# Patient Record
Sex: Female | Born: 1955 | Race: Black or African American | Hispanic: No | Marital: Married | Smoking: Former smoker
Health system: Southern US, Community
[De-identification: ages and names within clinical notes are randomized; demographics above are authoritative.]

## PROBLEM LIST (undated history)

## (undated) DIAGNOSIS — I639 Cerebral infarction, unspecified: Secondary | ICD-10-CM

## (undated) DIAGNOSIS — Z923 Personal history of irradiation: Secondary | ICD-10-CM

## (undated) DIAGNOSIS — J449 Chronic obstructive pulmonary disease, unspecified: Secondary | ICD-10-CM

## (undated) DIAGNOSIS — E785 Hyperlipidemia, unspecified: Secondary | ICD-10-CM

## (undated) DIAGNOSIS — C50919 Malignant neoplasm of unspecified site of unspecified female breast: Secondary | ICD-10-CM

## (undated) DIAGNOSIS — G35 Multiple sclerosis: Secondary | ICD-10-CM

## (undated) DIAGNOSIS — K635 Polyp of colon: Secondary | ICD-10-CM

## (undated) DIAGNOSIS — N189 Chronic kidney disease, unspecified: Secondary | ICD-10-CM

## (undated) DIAGNOSIS — C50412 Malignant neoplasm of upper-outer quadrant of left female breast: Secondary | ICD-10-CM

## (undated) DIAGNOSIS — D649 Anemia, unspecified: Secondary | ICD-10-CM

## (undated) DIAGNOSIS — G35D Multiple sclerosis, unspecified: Secondary | ICD-10-CM

## (undated) DIAGNOSIS — K219 Gastro-esophageal reflux disease without esophagitis: Secondary | ICD-10-CM

## (undated) DIAGNOSIS — H539 Unspecified visual disturbance: Secondary | ICD-10-CM

## (undated) DIAGNOSIS — I10 Essential (primary) hypertension: Secondary | ICD-10-CM

## (undated) DIAGNOSIS — M1990 Unspecified osteoarthritis, unspecified site: Secondary | ICD-10-CM

## (undated) DIAGNOSIS — Z9221 Personal history of antineoplastic chemotherapy: Secondary | ICD-10-CM

## (undated) HISTORY — DX: Chronic obstructive pulmonary disease, unspecified: J44.9

## (undated) HISTORY — DX: Multiple sclerosis: G35

## (undated) HISTORY — PX: POLYPECTOMY: SHX149

## (undated) HISTORY — DX: Unspecified visual disturbance: H53.9

## (undated) HISTORY — DX: Polyp of colon: K63.5

## (undated) HISTORY — DX: Malignant neoplasm of unspecified site of unspecified female breast: C50.919

## (undated) HISTORY — PX: COLONOSCOPY: SHX174

## (undated) HISTORY — DX: Anemia, unspecified: D64.9

## (undated) HISTORY — PX: TUBAL LIGATION: SHX77

## (undated) HISTORY — DX: Malignant neoplasm of upper-outer quadrant of left female breast: C50.412

## (undated) HISTORY — DX: Hyperlipidemia, unspecified: E78.5

## (undated) HISTORY — DX: Chronic kidney disease, unspecified: N18.9

## (undated) HISTORY — DX: Multiple sclerosis, unspecified: G35.D

## (undated) HISTORY — DX: Gastro-esophageal reflux disease without esophagitis: K21.9

---

## 2014-03-20 LAB — PROTIME-INR: INR: 1 (ref 0.9–1.1)

## 2014-08-19 ENCOUNTER — Emergency Department (HOSPITAL_COMMUNITY): Payer: Medicaid - Out of State

## 2014-08-19 ENCOUNTER — Encounter (HOSPITAL_COMMUNITY): Payer: Self-pay | Admitting: Emergency Medicine

## 2014-08-19 ENCOUNTER — Emergency Department (HOSPITAL_COMMUNITY)
Admission: EM | Admit: 2014-08-19 | Discharge: 2014-08-19 | Disposition: A | Discharge status: Home / Self Care | Payer: Medicaid - Out of State | Attending: Emergency Medicine | Admitting: Emergency Medicine

## 2014-08-19 DIAGNOSIS — Z79899 Other long term (current) drug therapy: Secondary | ICD-10-CM | POA: Insufficient documentation

## 2014-08-19 DIAGNOSIS — Z8673 Personal history of transient ischemic attack (TIA), and cerebral infarction without residual deficits: Secondary | ICD-10-CM | POA: Diagnosis not present

## 2014-08-19 DIAGNOSIS — R079 Chest pain, unspecified: Secondary | ICD-10-CM | POA: Diagnosis present

## 2014-08-19 DIAGNOSIS — I10 Essential (primary) hypertension: Secondary | ICD-10-CM | POA: Diagnosis not present

## 2014-08-19 HISTORY — DX: Essential (primary) hypertension: I10

## 2014-08-19 HISTORY — DX: Cerebral infarction, unspecified: I63.9

## 2014-08-19 LAB — CBC
HCT: 37.9 % (ref 36.0–46.0)
HEMOGLOBIN: 13 g/dL (ref 12.0–15.0)
MCH: 31 pg (ref 26.0–34.0)
MCHC: 34.3 g/dL (ref 30.0–36.0)
MCV: 90.5 fL (ref 78.0–100.0)
Platelets: 258 10*3/uL (ref 150–400)
RBC: 4.19 MIL/uL (ref 3.87–5.11)
RDW: 13.2 % (ref 11.5–15.5)
WBC: 5.8 10*3/uL (ref 4.0–10.5)

## 2014-08-19 LAB — COMPREHENSIVE METABOLIC PANEL
ALT: 25 U/L (ref 0–35)
ANION GAP: 13 (ref 5–15)
AST: 31 U/L (ref 0–37)
Albumin: 4.1 g/dL (ref 3.5–5.2)
Alkaline Phosphatase: 78 U/L (ref 39–117)
BUN: 14 mg/dL (ref 6–23)
CALCIUM: 9.8 mg/dL (ref 8.4–10.5)
CO2: 24 mEq/L (ref 19–32)
CREATININE: 1.12 mg/dL — AB (ref 0.50–1.10)
Chloride: 102 mEq/L (ref 96–112)
GFR, EST AFRICAN AMERICAN: 61 mL/min — AB (ref >90)
GFR, EST NON AFRICAN AMERICAN: 53 mL/min — AB (ref >90)
GLUCOSE: 99 mg/dL (ref 70–99)
Potassium: 4.1 mEq/L (ref 3.7–5.3)
Sodium: 139 mEq/L (ref 137–147)
Total Bilirubin: 0.4 mg/dL (ref 0.3–1.2)
Total Protein: 7.3 g/dL (ref 6.0–8.3)

## 2014-08-19 LAB — TROPONIN I

## 2014-08-19 MED ORDER — GI COCKTAIL ~~LOC~~
30.0000 mL | Freq: Once | ORAL | Status: AC
  Administered 2014-08-19: 30 mL via ORAL
  Filled 2014-08-19: qty 30

## 2014-08-19 MED ORDER — HYDROCODONE-ACETAMINOPHEN 5-325 MG PO TABS
1.0000 | ORAL_TABLET | Freq: Once | ORAL | Status: AC
  Administered 2014-08-19: 1 via ORAL
  Filled 2014-08-19: qty 1

## 2014-08-19 MED ORDER — FAMOTIDINE 20 MG PO TABS
20.0000 mg | ORAL_TABLET | Freq: Once | ORAL | Status: AC
  Administered 2014-08-19: 20 mg via ORAL
  Filled 2014-08-19: qty 1

## 2014-08-19 NOTE — ED Notes (Signed)
Pt A&Ox4. Moving all extremities equally. C/o left chest pain that does not radiate. C/o dizziness x3 months that "is only when I'm lying down but goes away when I sit up." Denies tobacco use at this time. Did not have a meal this morning and denies eating anything "out of the ordinary last night." Denies this pain feeling like heartburn. No other questions/concerns. Awaiting MD.

## 2014-08-19 NOTE — Discharge Instructions (Signed)
It was our pleasure to provide your ER care today - we hope that you feel better.  Continue prilosec.  You may also try pepcid and maalox as need for symptom relief if reflux symptoms.  For chest discomfort, follow up with cardiologist in coming week - call office Monday to arrange follow up appointment.  Your blood pressure is mildly high today, have it rechecked then as well.   Return to ER right away if worse, new symptoms, fevers, persistent/recurrent chest pain, trouble breathing, other concern.     Chest Pain (Nonspecific) It is often hard to give a specific diagnosis for the cause of chest pain. There is always a chance that your pain could be related to something serious, such as a heart attack or a blood clot in the lungs. You need to follow up with your health care provider for further evaluation. CAUSES   Heartburn.  Pneumonia or bronchitis.  Anxiety or stress.  Inflammation around your heart (pericarditis) or lung (pleuritis or pleurisy).  A blood clot in the lung.  A collapsed lung (pneumothorax). It can develop suddenly on its own (spontaneous pneumothorax) or from trauma to the chest.  Shingles infection (herpes zoster virus). The chest wall is composed of bones, muscles, and cartilage. Any of these can be the source of the pain.  The bones can be bruised by injury.  The muscles or cartilage can be strained by coughing or overwork.  The cartilage can be affected by inflammation and become sore (costochondritis). DIAGNOSIS  Lab tests or other studies may be needed to find the cause of your pain. Your health care provider may have you take a test called an ambulatory electrocardiogram (ECG). An ECG records your heartbeat patterns over a 24-hour period. You may also have other tests, such as:  Transthoracic echocardiogram (TTE). During echocardiography, sound waves are used to evaluate how blood flows through your heart.  Transesophageal echocardiogram  (TEE).  Cardiac monitoring. This allows your health care provider to monitor your heart rate and rhythm in real time.  Holter monitor. This is a portable device that records your heartbeat and can help diagnose heart arrhythmias. It allows your health care provider to track your heart activity for several days, if needed.  Stress tests by exercise or by giving medicine that makes the heart beat faster. TREATMENT   Treatment depends on what may be causing your chest pain. Treatment may include:  Acid blockers for heartburn.  Anti-inflammatory medicine.  Pain medicine for inflammatory conditions.  Antibiotics if an infection is present.  You may be advised to change lifestyle habits. This includes stopping smoking and avoiding alcohol, caffeine, and chocolate.  You may be advised to keep your head raised (elevated) when sleeping. This reduces the chance of acid going backward from your stomach into your esophagus. Most of the time, nonspecific chest pain will improve within 2-3 days with rest and mild pain medicine.  HOME CARE INSTRUCTIONS   If antibiotics were prescribed, take them as directed. Finish them even if you start to feel better.  For the next few days, avoid physical activities that bring on chest pain. Continue physical activities as directed.  Do not use any tobacco products, including cigarettes, chewing tobacco, or electronic cigarettes.  Avoid drinking alcohol.  Only take medicine as directed by your health care provider.  Follow your health care provider's suggestions for further testing if your chest pain does not go away.  Keep any follow-up appointments you made. If you do not  go to an appointment, you could develop lasting (chronic) problems with pain. If there is any problem keeping an appointment, call to reschedule. SEEK MEDICAL CARE IF:   Your chest pain does not go away, even after treatment.  You have a rash with blisters on your chest.  You have  a fever. SEEK IMMEDIATE MEDICAL CARE IF:   You have increased chest pain or pain that spreads to your arm, neck, jaw, back, or abdomen.  You have shortness of breath.  You have an increasing cough, or you cough up blood.  You have severe back or abdominal pain.  You feel nauseous or vomit.  You have severe weakness.  You faint.  You have chills. This is an emergency. Do not wait to see if the pain will go away. Get medical help at once. Call your local emergency services (911 in [place] Do not drive yourself to the hospital. MAKE SURE YOU:   Understand these instructions.  Will watch your condition.  Will get help right away if you are not doing well or get worse. Document Released: 08/12/2005 Document Revised: 11/07/2013 Document Reviewed: 06/07/2008 [facility] Patient Information 2015 [place], [place]. This information is not intended to replace advice given to you by your health care provider. Make sure you discuss any questions you have with your health care provider.

## 2014-08-19 NOTE — ED Provider Notes (Signed)
CSN: [id number]     Arrival date & time 08/19/14  1234 History   First MD Initiated Contact with Patient 08/19/14 1251     Chief Complaint  Patient presents with  . Chest Pain     (Consider location/radiation/quality/duration/timing/severity/associated sxs/prior Treatment) Patient is a 57 y.o. female presenting with chest pain. The history is provided by the patient.  Chest Pain Associated symptoms: no abdominal pain, no back pain, no cough, no fever, no headache, no nausea, no palpitations, no shortness of breath and not vomiting   pt c/o mid/midline chest pain onset yesterday, at rest. Pain constant since. No relation to position, not worse whether upright or supine. No relation to activity or exertion. Pain is dull, moderate, constant, non radiating, not pleuritic. Denies any associated sob, nv, diaphoresis or unusual fatigue. No cough or uri c/o. No fever or chills. Denies heartburn or reflux. Says father w  'heart problems' in older age, no fam hx premature cad. Non smoker. No drug use.  No recent febrile viral illness. No leg pain or swelling. No recent surgery, immobility, trauma or travel. No chest wall injury or strain.      Past Medical History  Diagnosis Date  . Stroke     2013  . Hypertension    No past surgical history on file. No family history on file. History  Substance Use Topics  . Smoking status: Not on file  . Smokeless tobacco: Not on file  . Alcohol Use: Not on file   OB History   Grav Para Term Preterm Abortions TAB SAB Ect Mult Living                 Review of Systems  Constitutional: Negative for fever and chills.  HENT: Negative for sore throat.   Eyes: Negative for redness.  Respiratory: Negative for cough and shortness of breath.   Cardiovascular: Positive for chest pain. Negative for palpitations and leg swelling.  Gastrointestinal: Negative for nausea, vomiting and abdominal pain.  Endocrine: Negative for polyuria.  Genitourinary: Negative  for flank pain.  Musculoskeletal: Negative for back pain and neck pain.  Skin: Negative for rash.  Neurological: Negative for headaches.  Hematological: Does not bruise/bleed easily.  Psychiatric/Behavioral: Negative for confusion.      Allergies  Review of patient's allergies indicates no known allergies.  Home Medications   Prior to Admission medications   Medication Sig Start Date End Date Taking? Authorizing Provider  labetalol (NORMODYNE) 300 MG tablet Take 600 mg by mouth 5 (five) times daily.   Yes Historical Provider, MD  niCARdipine (CARDENE) 20 MG capsule Take 40 mg by mouth every 8 (eight) hours.   Yes Historical Provider, MD  omeprazole (PRILOSEC) 20 MG capsule Take 20 mg by mouth 2 (two) times daily before a meal.   Yes Historical Provider, MD   BP 151/91  Pulse 78  Temp(Src) 97.9 F (36.6 C) (Oral)  Resp 16  SpO2 98% Physical Exam  Nursing note and vitals reviewed. Constitutional: She is oriented to person, place, and time. She appears well-developed and well-nourished. No distress.  HENT:  Mouth/Throat: Oropharynx is clear and moist.  Eyes: Conjunctivae are normal. No scleral icterus.  Neck: Neck supple. No JVD present. No tracheal deviation present.  Cardiovascular: Normal rate, regular rhythm, normal heart sounds and intact distal pulses.  Exam reveals no gallop and no friction rub.   No murmur heard. Pulmonary/Chest: Effort normal and breath sounds normal. No respiratory distress. She exhibits no tenderness.  Abdominal:  Soft. Normal appearance and bowel sounds are normal. She exhibits no distension and no mass. There is no tenderness. There is no rebound and no guarding.  Genitourinary:  No cva tenderness  Musculoskeletal: She exhibits no edema and no tenderness.  Neurological: She is alert and oriented to person, place, and time.  Skin: Skin is warm and dry. No rash noted. She is not diaphoretic.  Psychiatric: She has a normal mood and affect.    ED  Course  Procedures (including critical care time) Labs Review   Results for orders placed during the hospital encounter of 08/19/14  CBC      Result Value Ref Range   WBC 5.8  4.0 - 10.5 K/uL   RBC 4.19  3.87 - 5.11 MIL/uL   Hemoglobin 13.0  12.0 - 15.0 g/dL   HCT 37.9  36.0 - 46.0 %   MCV 90.5  78.0 - 100.0 fL   MCH 31.0  26.0 - 34.0 pg   MCHC 34.3  30.0 - 36.0 g/dL   RDW 13.2  11.5 - 15.5 %   Platelets 258  150 - 400 K/uL  COMPREHENSIVE METABOLIC PANEL      Result Value Ref Range   Sodium 139  137 - 147 mEq/L   Potassium 4.1  3.7 - 5.3 mEq/L   Chloride 102  96 - 112 mEq/L   CO2 24  19 - 32 mEq/L   Glucose, Bld 99  70 - 99 mg/dL   BUN 14  6 - 23 mg/dL   Creatinine, Ser 1.12 (*) 0.50 - 1.10 mg/dL   Calcium 9.8  8.4 - 10.5 mg/dL   Total Protein 7.3  6.0 - 8.3 g/dL   Albumin 4.1  3.5 - 5.2 g/dL   AST 31  0 - 37 U/L   ALT 25  0 - 35 U/L   Alkaline Phosphatase 78  39 - 117 U/L   Total Bilirubin 0.4  0.3 - 1.2 mg/dL   GFR calc non Af Amer 53 (*) >90 mL/min   GFR calc Af Amer 61 (*) >90 mL/min   Anion gap 13  5 - 15  TROPONIN I      Result Value Ref Range   Troponin I <0.30  <0.30 ng/mL   Dg Chest 2 View  08/19/2014   CLINICAL DATA:  Mid chest discomfort with history of hypertension and previous CVA ; initial visit  EXAM: CHEST  2 VIEW  COMPARISON:  None.  FINDINGS: The lungs are adequately inflated and clear. The cardiac silhouette is top-normal in size. The pulmonary vascularity is not engorged. The mediastinum is normal in width. There is mild tortuosity of the descending thoracic aorta. There is no pleural effusion, pneumothorax, or pneumomediastinum. The bony thorax is unremarkable.  IMPRESSION: There is no acute cardiopulmonary abnormality.   Electronically Signed   By: David  Martinique   On: 08/19/2014 13:43      EKG Interpretation   Date/Time:  Sunday August 19 2014 12:41:29 EDT Ventricular Rate:  78 PR Interval:  166 QRS Duration: 83 QT Interval:  395 QTC  Calculation: 450 R Axis:   37 Text Interpretation:  Sinus rhythm Nonspecific T abnormalities, lateral  leads Baseline wander in lead(s) V3 No previous tracing Confirmed by  Ashok Cordia  MD, Lennette Bihari ([id number]) on 08/19/2014 12:52:43 PM      MDM  Iv ns. Continuous pulse ox and monitor. Labs.  Reviewed nursing notes and prior charts for additional history.   Pt reports normal stress  test approx 3 yrs ago. No exertional cp or discomfort. No sob.  Will try pepcid, gi cocktail, vicodin 1 po - for symptom relief.  Pt w relief of symptoms w above meds.  After symptoms present, constant, x 24 hrs, trop normal/negative.  Pt currently appears stable for d/c.  Given recent cp, will have f/u closely w pcp/card as outpt.       Mirna Mires, MD 08/19/14 [phone]

## 2014-08-19 NOTE — ED Notes (Signed)
Pt reports hx of stroke in 2013 and HTN. Reports intermittent left sided chest pain starting yesterday. Aching pain 7/10. Denies SOB, denies n/v/d.

## 2014-11-05 ENCOUNTER — Encounter (HOSPITAL_COMMUNITY): Payer: Self-pay | Admitting: Emergency Medicine

## 2014-11-05 ENCOUNTER — Emergency Department (HOSPITAL_COMMUNITY)
Admission: EM | Admit: 2014-11-05 | Discharge: 2014-11-05 | Disposition: A | Discharge status: Home / Self Care | Payer: Medicare Other | Attending: Emergency Medicine | Admitting: Emergency Medicine

## 2014-11-05 DIAGNOSIS — J069 Acute upper respiratory infection, unspecified: Secondary | ICD-10-CM | POA: Diagnosis not present

## 2014-11-05 DIAGNOSIS — Z79899 Other long term (current) drug therapy: Secondary | ICD-10-CM | POA: Diagnosis not present

## 2014-11-05 DIAGNOSIS — Z8673 Personal history of transient ischemic attack (TIA), and cerebral infarction without residual deficits: Secondary | ICD-10-CM | POA: Insufficient documentation

## 2014-11-05 DIAGNOSIS — M546 Pain in thoracic spine: Secondary | ICD-10-CM | POA: Diagnosis not present

## 2014-11-05 DIAGNOSIS — I10 Essential (primary) hypertension: Secondary | ICD-10-CM | POA: Insufficient documentation

## 2014-11-05 DIAGNOSIS — R0981 Nasal congestion: Secondary | ICD-10-CM | POA: Diagnosis present

## 2014-11-05 LAB — URINALYSIS, ROUTINE W REFLEX MICROSCOPIC
Glucose, UA: NEGATIVE mg/dL
HGB URINE DIPSTICK: NEGATIVE
Ketones, ur: NEGATIVE mg/dL
Nitrite: NEGATIVE
Protein, ur: 100 mg/dL — AB
Specific Gravity, Urine: 1.026 (ref 1.005–1.030)
UROBILINOGEN UA: 0.2 mg/dL (ref 0.0–1.0)
pH: 5.5 (ref 5.0–8.0)

## 2014-11-05 LAB — URINE MICROSCOPIC-ADD ON

## 2014-11-05 LAB — I-STAT CHEM 8, ED
BUN: 16 mg/dL (ref 6–23)
CREATININE: 1.5 mg/dL — AB (ref 0.50–1.10)
Calcium, Ion: 1.1 mmol/L — ABNORMAL LOW (ref 1.12–1.23)
Chloride: 104 mEq/L (ref 96–112)
Glucose, Bld: 117 mg/dL — ABNORMAL HIGH (ref 70–99)
HEMATOCRIT: 41 % (ref 36.0–46.0)
HEMOGLOBIN: 13.9 g/dL (ref 12.0–15.0)
POTASSIUM: 3.7 meq/L (ref 3.7–5.3)
SODIUM: 141 meq/L (ref 137–147)
TCO2: 22 mmol/L (ref 0–100)

## 2014-11-05 NOTE — Discharge Instructions (Signed)
Return to the ER with any worsening of symptoms or trouble breathing or high fever. Return to the ER if your back pain worsens or becomes more frequent. Return if you develop any nausea, vomiting, severe pain, trouble urinating. Refer to resource guide below to help you find a primary care physician in regard to your visit today.  Upper Respiratory Infection, Adult An upper respiratory infection (URI) is also sometimes known as the common cold. The upper respiratory tract includes the nose, sinuses, throat, trachea, and bronchi. Bronchi are the airways leading to the lungs. Most people improve within 1 week, but symptoms can last up to 2 weeks. A residual cough may last even longer.  CAUSES Many different viruses can infect the tissues lining the upper respiratory tract. The tissues become irritated and inflamed and often become very moist. Mucus production is also common. A cold is contagious. You can easily spread the virus to others by oral contact. This includes kissing, sharing a glass, coughing, or sneezing. Touching your mouth or nose and then touching a surface, which is then touched by another person, can also spread the virus. SYMPTOMS  Symptoms typically develop 1 to 3 days after you come in contact with a cold virus. Symptoms vary from person to person. They may include:  Runny nose.  Sneezing.  Nasal congestion.  Sinus irritation.  Sore throat.  Loss of voice (laryngitis).  Cough.  Fatigue.  Muscle aches.  Loss of appetite.  Headache.  Low-grade fever. DIAGNOSIS  You might diagnose your own cold based on familiar symptoms, since most people get a cold 2 to 3 times a year. Your caregiver can confirm this based on your exam. Most importantly, your caregiver can check that your symptoms are not due to another disease such as strep throat, sinusitis, pneumonia, asthma, or epiglottitis. Blood tests, throat tests, and X-rays are not necessary to diagnose a common cold, but  they may sometimes be helpful in excluding other more serious diseases. Your caregiver will decide if any further tests are required. RISKS AND COMPLICATIONS  You may be at risk for a more severe case of the common cold if you smoke cigarettes, have chronic heart disease (such as heart failure) or lung disease (such as asthma), or if you have a weakened immune system. The very young and very old are also at risk for more serious infections. Bacterial sinusitis, middle ear infections, and bacterial pneumonia can complicate the common cold. The common cold can worsen asthma and chronic obstructive pulmonary disease (COPD). Sometimes, these complications can require emergency medical care and may be life-threatening. PREVENTION  The best way to protect against getting a cold is to practice good hygiene. Avoid oral or hand contact with people with cold symptoms. Wash your hands often if contact occurs. There is no clear evidence that vitamin C, vitamin E, echinacea, or exercise reduces the chance of developing a cold. However, it is always recommended to get plenty of rest and practice good nutrition. TREATMENT  Treatment is directed at relieving symptoms. There is no cure. Antibiotics are not effective, because the infection is caused by a virus, not by bacteria. Treatment may include:  Increased fluid intake. Sports drinks offer valuable electrolytes, sugars, and fluids.  Breathing heated mist or steam (vaporizer or shower).  Eating chicken soup or other clear broths, and maintaining good nutrition.  Getting plenty of rest.  Using gargles or lozenges for comfort.  Controlling fevers with ibuprofen or acetaminophen as directed by your caregiver.  Increasing  usage of your inhaler if you have asthma. Zinc gel and zinc lozenges, taken in the first 24 hours of the common cold, can shorten the duration and lessen the severity of symptoms. Pain medicines may help with fever, muscle aches, and throat  pain. A variety of non-prescription medicines are available to treat congestion and runny nose. Your caregiver can make recommendations and may suggest nasal or lung inhalers for other symptoms.  HOME CARE INSTRUCTIONS   Only take over-the-counter or prescription medicines for pain, discomfort, or fever as directed by your caregiver.  Use a warm mist humidifier or inhale steam from a shower to increase air moisture. This may keep secretions moist and make it easier to breathe.  Drink enough water and fluids to keep your urine clear or pale yellow.  Rest as needed.  Return to work when your temperature has returned to normal or as your caregiver advises. You may need to stay home longer to avoid infecting others. You can also use a face mask and careful hand washing to prevent spread of the virus. SEEK MEDICAL CARE IF:   After the first few days, you feel you are getting worse rather than better.  You need your caregiver's advice about medicines to control symptoms.  You develop chills, worsening shortness of breath, or brown or red sputum. These may be signs of pneumonia.  You develop yellow or brown nasal discharge or pain in the face, especially when you bend forward. These may be signs of sinusitis.  You develop a fever, swollen neck glands, pain with swallowing, or white areas in the back of your throat. These may be signs of strep throat. SEEK IMMEDIATE MEDICAL CARE IF:   You have a fever.  You develop severe or persistent headache, ear pain, sinus pain, or chest pain.  You develop wheezing, a prolonged cough, cough up blood, or have a change in your usual mucus (if you have chronic lung disease).  You develop sore muscles or a stiff neck. Document Released: 04/28/2001 Document Revised: 01/25/2012 Document Reviewed: 02/07/2014 [facility] Patient Information 2015 [place], [place]. This information is not intended to replace advice given to you by your health care provider. Make sure  you discuss any questions you have with your health care provider.   Emergency Department Resource Guide 1) Find a Doctor and Pay Out of Pocket Although you won't have to find out who is covered by your insurance plan, it is a good idea to ask around and get recommendations. You will then need to call the office and see if the doctor you have chosen will accept you as a new patient and what types of options they offer for patients who are self-pay. Some doctors offer discounts or will set up payment plans for their patients who do not have insurance, but you will need to ask so you aren't surprised when you get to your appointment.  2) Contact Your Local Health Department Not all health departments have doctors that can see patients for sick visits, but many do, so it is worth a call to see if yours does. If you don't know where your local health department is, you can check in your phone book. The CDC also has a tool to help you locate your state's health department, and many state websites also have listings of all of their local health departments.  3) Find a [facility] If your illness is not likely to be very severe or complicated, you may want to try a  walk in clinic. These are popping up all over the country in pharmacies, drugstores, and shopping centers. They're usually staffed by nurse practitioners or physician assistants that have been trained to treat common illnesses and complaints. They're usually fairly quick and inexpensive. However, if you have serious medical issues or chronic medical problems, these are probably not your best option.  No Primary Care Doctor: - Call Health Connect at  [phone] - they can help you locate a primary care doctor that  accepts your insurance, provides certain services, etc. - Physician Referral Service- [phone]  Chronic Pain Problems: Organization         Address  Phone   Notes  [facility]  [phone] Patients need  to be referred by their primary care doctor.   Medication Assistance: Organization         Address  Phone   Notes  [facility] Medication [facility] [address] [place], [place] [address] [phone] --Must be a resident of [facility] -- Must have NO insurance coverage whatsoever (no Medicaid/ Medicare, etc.) -- The pt. MUST have a primary care doctor that directs their care regularly and follows them in the community   MedAssist  [phone]   [employer]  [phone]    Agencies that provide inexpensive medical care: Organization         Address  Phone   Notes  [place]  [phone]   [facility]    [phone]   [facility] [place], [place] [address] [phone]   [place] [address], [place] [phone]   Planned Parenthood    [phone]   [facility]    [phone]   [place] and [place] [address], [place] Phone:  [phone], Fax:  [phone] Hours of Operation:  9 am - 6 pm, M-F.  Also accepts Medicaid/Medicare and self-pay.  [facility] for [place], [address], [place] Phone: [phone], Fax: [phone]. Hours of Operation:  8:30 am - 5:30 pm, M-F.  Also accepts Medicaid and self-pay.  [facility] [address], [place] Phone: [phone]   [place], [place], [place] [phone], Ext. [phone] Mondays & Thursdays: 7-9 AM.  First 15 patients are seen on a first come, first serve basis.    [place] Providers:  Organization         Address  Phone   Notes  [facility] [address], [place] [phone] Also accepts self-pay patients.  [facility] [address], [place]  [phone]   [place], [address], [place] [phone]   [facility] [address] [place] [phone]   Lucianne Lei [address], [place]   [phone] Only accepts [place] Access [place] patients after they have their name applied to their card.   Self-Pay (no insurance) in [facility]:  Organization         Address  Phone   Notes  Sickle Cell Patients, [facility] [place] [phone]   [facility] [place] [phone]   Gershon Mussel  [facility] [place]  [address], [place] [phone]   [facility]/Dr. Osei-Bonsu  [address] [place] or [address], [place] [phone] Phone number for both [place] and [place] locations is the same.  [facility] and [facility] [address], [place] [phone]   [facility] [address], [place] or [address] [phone]   [facility] [address], [place] [phone], phone; [phone], fax Sees patients 1st and 3rd Saturday of every month.  Must not qualify for public or private insurance (i.e. Medicaid, Medicare, [place] Health Choice, Veterans' Benefits)  Household income should be no more than 200% of the poverty level The clinic cannot treat you if you are pregnant or think you are pregnant  Sexually transmitted diseases are not treated at the clinic.    Dental Care: Organization         Address  Phone  Notes  [facility] Department of [facility] [place] [phone] Accepts children up to age 4 who are enrolled in [place] or [place]; pregnant women with a Medicaid card; and children who have applied for Medicaid or [place] Health Choice, but were declined, whose  parents can pay a reduced fee at time of service.  [facility] Department of [facility]  [address], [place] [phone] Accepts children up to age 35 who are enrolled in [place] or [place]; pregnant women with a Medicaid card; and children who have applied for Medicaid or  Health Choice, but were declined, whose parents can pay a reduced fee at time of service.  [place] Adult Dental Access PROGRAM  [place] [phone] Patients are seen by appointment only. Walk-ins are not accepted. [place] will see patients 60 years of age and older. Monday - Tuesday (8am-5pm) Most Wednesdays (8:30-5pm) $30 per visit, cash only  [facility] Adult Dental Access PROGRAM  [address], [facility] [phone] Patients are seen by appointment only. Walk-ins are not accepted. [place] will see patients 63 years of age and older. One Wednesday Evening (Monthly: Volunteer Based).  $30 per visit, cash only  [place]  [phone] for adults; Children under age 52, call Graduate Pediatric Dentistry at [phone]. Children aged 55-14, please call [phone] to request a pediatric application.  Dental services are provided in all areas of dental care including fillings, crowns and bridges, complete and partial dentures, implants, gum treatment, root canals, and extractions. Preventive care is also provided. Treatment is provided to both adults and children. Patients are selected via a lottery and there is often a waiting list.   [facility] [address] [place]  [phone] [email or web address]   [facility] [address] [place], [place] [phone], Ext. [phone] Second and Fourth Thursday of each month, opens at 6:30 AM; Clinic ends at 9 AM.  Patients are seen on a first-come first-served basis, and a limited number are seen during each clinic.   [facility]   [address] Hillard Danker [place], [place] [phone]   Eligibility Requirements You must have lived in [place], [place], or [place] counties for at least the last three months.   You cannot be eligible for state or federal sponsored healthcare  insurance, including [employer], [place], or [employer].   You generally cannot be eligible for healthcare insurance through your employer.    How to apply: Eligibility screenings are held every Tuesday and Wednesday afternoon from 1:00 pm until 4:00 pm. You do not need an appointment for the interview!  [facility] [address], [place], [place]   [place]  [place] Department  [place]  [phone]    Behavioral Health Resources in the Community: Intensive Outpatient Programs Organization         Address  Phone  Notes  [facility] [place]. [address], [place], [place] [phone]   [facility] [address] [place], [place]   ADS: Alcohol & Drug Svcs [address], [place], [place]   [place] [address]  [place], [place] or [phone]   Substance Abuse Resources Organization         Address  Phone  Notes  Alcohol and Drug Services  [phone]   [place]  [phone]   [facility]   Chinita Pester  [phone]   Residential & Outpatient Substance Abuse Program  [phone]   Psychological Services Organization         Address  Phone  Notes  [facility] [place]  [place]  [phone]   [place] [address] [place] or [phone]    Mobile Crisis Teams Organization         Address  Phone  Notes  [facility], Mobile Crisis Care Unit  [phone]     [facility]  [address] [place], [place]   [place] [address] [place] [phone]    Self-Help/Support Groups Organization         Address  Phone             Notes  [place]. of [place] - variety of support groups  [place] Call for more information  Narcotics Anonymous (NA), [facility] [address], [employer] [place]  2 meetings at this location   Special educational needs teacher         Address  Phone  Notes  [facility] [place],    [place]  [phone]   [facility]  [address] [place] [phone], [place], [place]   [place], [place] [phone] Admissions: 8am-3pm M-F  Incentives Substance [place] [address]    [place], [place] [phone]   [facility] [address] [place], [place], [place]   [facility] [address],  [place], [place]   [facility] Programs - Intensive Outpatient [address]een [address], [place], [place]   [facility] ([address].,  [place], [place] [phone] or [phone]   [facility] Services (RTS) [address] [place], [place] Accepts Medicaid  [facility] [place] [address]  [place] [phone] Substance Abuse/Addiction Treatment   [facility] Organization         Address  Phone  Notes  [facility]  [phone]   Domenic Schwab, PhD [address] [place], [place]   [phone] or (  [place]) [phone]   [facility]   [address] [place], [place] [phone]   [address], [place], [place] [phone] Insurance/Medicaid/sponsorship through [facility] and Families [address], Ste [place], [place] [phone] [place] [address]   [place], [place] [phone]    Dr. Adele Schilder  [phone]   [facility] Dept. 1) [address] [place] 2) [place] 3)  [place] [address], [place] [phone]  [phone]   [place] [phone] or [phone] (After Hours)

## 2014-11-05 NOTE — ED Provider Notes (Signed)
CSN: [id number]     Arrival date & time 11/05/14  1035 History  This chart was scribed for non-physician practitioner, Dahlia Bailiff, PA-C working with Ezequiel Essex, MD by Frederich Balding, ED scribe. This patient was seen in room WTR5/WTR5 and the patient's care was started at 12:17 PM.    Chief Complaint  Patient presents with  . Back Pain  . Nasal Congestion   The history is provided by the patient. No language interpreter was used.    HPI Comments: Michele Page is a 58 y.o. female with history of hypertension and stroke who presents to the Emergency Department complaining of nasal congestion and postnasal drip that started 2-3 days ago. Reports mild sore throat. Pt is also complaining of a lump to her left mid back that started 2 days ago. Reports mild, intermittent pain around the area. States pain is only about once per week and if she presses really hard on the area. Denies pain to that area in the past. Denies history of kidney problems or kidney stones. Denies fever, sinus pressure, cough, trouble breathing, difficulty urinating, dysuria, hematuria, numbness, weakness. Pt is not a smoker. She does not have a PCP.   Past Medical History  Diagnosis Date  . Stroke     2013  . Hypertension    History reviewed. No pertinent past surgical history. History reviewed. No pertinent family history. History  Substance Use Topics  . Smoking status: Never Smoker   . Smokeless tobacco: Never Used  . Alcohol Use: No   OB History    No data available     Review of Systems  Constitutional: Negative for fever.  HENT: Positive for congestion, postnasal drip and sore throat. Negative for sinus pressure.   Respiratory: Negative for cough.   Genitourinary: Negative for dysuria, hematuria and difficulty urinating.  Musculoskeletal: Positive for back pain.  Neurological: Negative for weakness and numbness.  All other systems reviewed and are negative.  Allergies  Review of patient's allergies  indicates no known allergies.  Home Medications   Prior to Admission medications   Medication Sig Start Date End Date Taking? Authorizing Provider  labetalol (NORMODYNE) 300 MG tablet Take 600 mg by mouth 5 (five) times daily.    Historical Provider, MD  niCARdipine (CARDENE) 20 MG capsule Take 40 mg by mouth every 8 (eight) hours.    Historical Provider, MD  omeprazole (PRILOSEC) 20 MG capsule Take 20 mg by mouth 2 (two) times daily before a meal.    Historical Provider, MD   BP 105/77 mmHg  Pulse 75  Temp(Src) 97.7 F (36.5 C) (Oral)  Resp 18  SpO2 98%   Physical Exam  Constitutional: She is oriented to person, place, and time. She appears well-developed and well-nourished. No distress.  HENT:  Head: Normocephalic and atraumatic.  Right Ear: Tympanic membrane normal.  Left Ear: Tympanic membrane normal.  Moderate amount of erythema and swelling to nasal turbinates bilaterally. Mild erythema to posterior oropharynx. Tonsils are non swollen. No exudate. Left maxillary sinus tenderness.  Eyes: Conjunctivae and EOM are normal.  Neck: Neck supple. No tracheal deviation present.  Cardiovascular: Normal rate, regular rhythm, S1 normal, S2 normal and normal heart sounds.   Pulmonary/Chest: Effort normal and breath sounds normal. No accessory muscle usage. No tachypnea. No respiratory distress.  Lungs clear equally and bilaterally.  Abdominal: Normal appearance and bowel sounds are normal. There is no tenderness.  Musculoskeletal: Normal range of motion.  Neurological: She is alert and oriented to person,  place, and time.  Skin: Skin is warm and dry.  Psychiatric: She has a normal mood and affect. Her behavior is normal.  Nursing note and vitals reviewed.   ED Course  Procedures (including critical care time)  DIAGNOSTIC STUDIES: Oxygen Saturation is 96% on RA, normal by my interpretation.    COORDINATION OF CARE: 12:26 PM-Discussed treatment plan which includes OTC cold  medications and nasal saline with pt at bedside and pt agreed to plan.   Labs Review Labs Reviewed  URINALYSIS, ROUTINE W REFLEX MICROSCOPIC - Abnormal; Notable for the following:    Color, Urine AMBER (*)    APPearance CLOUDY (*)    Bilirubin Urine SMALL (*)    Protein, ur 100 (*)    Leukocytes, UA TRACE (*)    All other components within normal limits  URINE MICROSCOPIC-ADD ON - Abnormal; Notable for the following:    Casts HYALINE CASTS (*)    All other components within normal limits  I-STAT CHEM 8, ED - Abnormal; Notable for the following:    Creatinine, Ser 1.50 (*)    Glucose, Bld 117 (*)    Calcium, Ion 1.10 (*)    All other components within normal limits  URINE CULTURE    Imaging Review No results found.   EKG Interpretation None      MDM   Final diagnoses:  URI (upper respiratory infection)    UA unremarkable for any acute pathology. Renal function looks stable as compared to last results at 2 months ago. Patient having no symptoms of her flank pain today that she was experiencing over the past several weeks, patient has extremely vague description of this flank pain and states it is reproducible. By patient description, it sounds like her flank pain may be musculoskeletal in nature, and I strongly recommended patient follow-up with a primary care physician regarding this for monitoring of her renal function and for possible need for any outpatient imaging in the future. No concern for acute kidney injury or pathology today.  Patient also complaining of symptoms of sinusitis.     Mild to moderate symptoms of clear/yellow nasal discharge/congestion with cough for less than 10 days.  Patient is afebrile.  No concern for acute bacterial rhinosinusitis; likely viral in nature.  Patient discharged with symptomatic treatment.  Patient instructions given for warm saline nasal washes.  Recommendations for follow-up with primary care physician.    I personally performed  the services described in this documentation, which was scribed in my presence. The recorded information has been reviewed and is accurate.  BP 105/77 mmHg  Pulse 75  Temp(Src) 97.7 F (36.5 C) (Oral)  Resp 18  SpO2 98%  Signed,  Dahlia Bailiff, PA-C 9:29 PM  Patient seen and discussed with Dr. Ezequiel Essex, M.D.  Carrie Mew, PA-C 11/05/14 2131  Ezequiel Essex, MD 11/06/14 [phone]

## 2014-11-05 NOTE — ED Notes (Signed)
Pt sts that she has L sided flank pain that increase with palpation. Denies any pain with urination or abd pain. Pt also has nasal congestion for several days.

## 2014-11-06 LAB — URINE CULTURE
Colony Count: NO GROWTH
Culture: NO GROWTH

## 2014-11-13 ENCOUNTER — Other Ambulatory Visit: Payer: Self-pay

## 2014-11-13 DIAGNOSIS — Z1231 Encounter for screening mammogram for malignant neoplasm of breast: Secondary | ICD-10-CM

## 2015-01-22 ENCOUNTER — Encounter: Payer: Self-pay | Admitting: Internal Medicine

## 2015-01-23 ENCOUNTER — Encounter: Payer: Self-pay | Admitting: Internal Medicine

## 2015-01-23 ENCOUNTER — Ambulatory Visit (INDEPENDENT_AMBULATORY_CARE_PROVIDER_SITE_OTHER): Payer: Medicare Other | Admitting: Internal Medicine

## 2015-01-23 VITALS — BP 146/90 | HR 76 | Temp 97.8°F | Resp 20 | Ht 62.08 in | Wt 210.8 lb

## 2015-01-23 DIAGNOSIS — T887XXA Unspecified adverse effect of drug or medicament, initial encounter: Secondary | ICD-10-CM | POA: Diagnosis not present

## 2015-01-23 DIAGNOSIS — I6930 Unspecified sequelae of cerebral infarction: Secondary | ICD-10-CM

## 2015-01-23 DIAGNOSIS — L658 Other specified nonscarring hair loss: Secondary | ICD-10-CM | POA: Diagnosis not present

## 2015-01-23 DIAGNOSIS — I10 Essential (primary) hypertension: Secondary | ICD-10-CM | POA: Diagnosis not present

## 2015-01-23 DIAGNOSIS — R002 Palpitations: Secondary | ICD-10-CM

## 2015-01-23 DIAGNOSIS — T50905A Adverse effect of unspecified drugs, medicaments and biological substances, initial encounter: Secondary | ICD-10-CM

## 2015-01-23 MED ORDER — NICARDIPINE HCL 20 MG PO CAPS: 40.0000 mg | ORAL_CAPSULE | Freq: Three times a day (TID) | ORAL | Status: DC

## 2015-01-23 MED ORDER — LOSARTAN POTASSIUM 50 MG PO TABS: 50.0000 mg | ORAL_TABLET | Freq: Every day | ORAL | Status: DC

## 2015-01-23 NOTE — Progress Notes (Signed)
Patient ID: Michele Page, female   DOB: 1956/07/21, 59 y.o.   MRN: [id number]    Facility  [facility]    Place of Service:   OFFICE   No Known Allergies  Chief Complaint  Patient presents with  . Establish Care    New patient establish care ,not fasting  . Chest Pain    Patient c/o pain in chest for about 6 months, patient was seen for this in ER  . Medication Management    discuss blood pressure medication  . Immunizations    patient states up to date, need to get records    HPI:  59 yo female seen today as a new pt. She c/o 6-7 mo hx palpitations that was initially intense but now softening. No CP. Sx's unrelated to activity or po intake. She relocated from [place] in Nov 2015.   She had a stroke 3 yrs ago and had deficits of right sided weakness, expressive aphasia. She had extensive rehab and no longer has right sided weakness but expressive aphasia remains. No visual deficits. No dysphagia. No balance issues.   HTN has been elevated recently with SBP180-190s. She currently takes labetalol and nicardipine but does not feel it is controlling her BP. She takes labetalol q2hrs instead of q6hrs.  Past Medical History  Diagnosis Date  . Stroke     2013  . Hypertension    History reviewed. No pertinent past surgical history. Family History  Problem Relation Age of Onset  . Stroke Sister   . Alcohol abuse Brother   . HIV/AIDS Brother    History   Social History  . Marital Status: Married    Spouse Name: N/A  . Number of Children: N/A  . Years of Education: N/A   Social History Main Topics  . Smoking status: Never Smoker   . Smokeless tobacco: Never Used  . Alcohol Use: No  . Drug Use: No  . Sexual Activity: No   Other Topics Concern  . None   Social History Narrative   Diet- N/A   Caffeine- Yes   Married- Yes   House- 2 story with 2 people   Pets- No   Current/past profession- Nurse, mental health   Exercise- Yes   Living will-No   DNR-N/A   POA/HPOA-No           Medications: Patient's Medications  New Prescriptions   No medications on file  Previous Medications   LABETALOL (NORMODYNE) 300 MG TABLET    2 by mouth every 6 hours   NICARDIPINE (CARDENE) 20 MG CAPSULE    Take 40 mg by mouth every 8 (eight) hours.  Modified Medications   No medications on file  Discontinued Medications   OMEPRAZOLE (PRILOSEC) 20 MG CAPSULE    Take 20 mg by mouth 2 (two) times daily before a meal.     Review of Systems  Constitutional: Negative for fever, chills, diaphoresis, activity change, appetite change and fatigue.  HENT: Negative for ear pain and sore throat.   Eyes: Positive for visual disturbance (wears eyeglasses).  Respiratory: Negative for cough, chest tightness and shortness of breath.   Cardiovascular: Positive for palpitations. Negative for chest pain and leg swelling.  Gastrointestinal: Negative for nausea, vomiting, abdominal pain, diarrhea, constipation and blood in stool.  Genitourinary: Negative for dysuria.  Musculoskeletal: Negative for arthralgias.  Skin:       Hair is falling out  Neurological: Positive for speech difficulty and weakness. Negative for dizziness, tremors, numbness and headaches.  Psychiatric/Behavioral: Negative for sleep disturbance. The patient is not nervous/anxious.     Filed Vitals:   01/23/15 0825  BP: 146/90  Pulse: 76  Temp: 97.8 F (36.6 C)  TempSrc: Oral  Resp: 20  Height: 5' 2.08" (1.577 m)  Weight: 210 lb 12.8 oz (95.618 kg)  SpO2: 98%   Body mass index is 38.45 kg/(m^2).  Physical Exam  Constitutional: She is oriented to person, place, and time. She appears well-developed and well-nourished.  HENT:  Mouth/Throat: Oropharynx is clear and moist. No oropharyngeal exudate.  Eyes: EOM are normal. Pupils are equal, round, and reactive to light. No scleral icterus.  Neck: Neck supple. No tracheal deviation present. No thyromegaly present.  Cardiovascular: Normal rate, regular rhythm, normal heart  sounds and intact distal pulses.  Exam reveals no gallop and no friction rub.   No murmur heard. No LE edema b/l. no calf TTP. No carotid bruit b/l  Pulmonary/Chest: Effort normal and breath sounds normal. No stridor. No respiratory distress. She has no wheezes. She has no rales.  Abdominal: Soft. Bowel sounds are normal. She exhibits no distension and no mass. There is no tenderness. There is no rebound and no guarding.  Lymphadenopathy:    She has no cervical adenopathy.  Neurological: She is alert and oriented to person, place, and time. She has normal strength. She displays abnormal reflex.  Reflex Scores:      Bicep reflexes are 3+ on the right side and 2+ on the left side.      Brachioradialis reflexes are 3+ on the right side and 2+ on the left side.      Patellar reflexes are 2+ on the right side and 2+ on the left side. (+) expressive aphasia  Skin: Skin is warm and dry. No rash noted.  Psychiatric: She has a normal mood and affect. Her behavior is normal. Judgment and thought content normal.     Labs reviewed: Admission on 11/05/2014, Discharged on 11/05/2014  Component Date Value Ref Range Status  . Specimen Description 11/05/2014 URINE, CLEAN CATCH   Final  . Special Requests 11/05/2014 NONE   Final  . Culture  Setup Time 11/05/2014    Final                   Value:11/05/2014 15:16 Performed at [facility]   . Colony Count 11/05/2014    Final                   Value:NO GROWTH Performed at [facility]   . Culture 11/05/2014    Final                   Value:NO GROWTH Performed at [facility]   . Report Status 11/05/2014 11/06/2014 FINAL   Final  . Color, Urine 11/05/2014 AMBER* YELLOW Final   BIOCHEMICALS MAY BE AFFECTED BY COLOR  . APPearance 11/05/2014 CLOUDY* CLEAR Final  . Specific Gravity, Urine 11/05/2014 1.026  1.005 - 1.030 Final  . pH 11/05/2014 5.5  5.0 - 8.0 Final  . Glucose, UA 11/05/2014 NEGATIVE  NEGATIVE mg/dL Final  . Hgb  urine dipstick 11/05/2014 NEGATIVE  NEGATIVE Final  . Bilirubin Urine 11/05/2014 SMALL* NEGATIVE Final  . Ketones, ur 11/05/2014 NEGATIVE  NEGATIVE mg/dL Final  . Protein, ur 11/05/2014 100* NEGATIVE mg/dL Final  . Urobilinogen, UA 11/05/2014 0.2  0.0 - 1.0 mg/dL Final  . Nitrite 11/05/2014 NEGATIVE  NEGATIVE Final  . Leukocytes, UA 11/05/2014 TRACE*  NEGATIVE Final  . Sodium 11/05/2014 141  137 - 147 mEq/L Final  . Potassium 11/05/2014 3.7  3.7 - 5.3 mEq/L Final  . Chloride 11/05/2014 104  96 - 112 mEq/L Final  . BUN 11/05/2014 16  6 - 23 mg/dL Final  . Creatinine, Ser 11/05/2014 1.50* 0.50 - 1.10 mg/dL Final  . Glucose, Bld 11/05/2014 117* 70 - 99 mg/dL Final  . Calcium, Ion 11/05/2014 1.10* 1.12 - 1.23 mmol/L Final  . TCO2 11/05/2014 22  0 - 100 mmol/L Final  . Hemoglobin 11/05/2014 13.9  12.0 - 15.0 g/dL Final  . HCT 11/05/2014 41.0  36.0 - 46.0 % Final  . Squamous Epithelial / LPF 11/05/2014 RARE  RARE Final  . WBC, UA 11/05/2014 0-2  <3 WBC/hpf Final  . Bacteria, UA 11/05/2014 RARE  RARE Final  . Casts 11/05/2014 HYALINE CASTS* NEGATIVE Final   GRANULAR CAST  . Urine-Other 11/05/2014 MUCOUS PRESENT   Final   ECG reviewed by myself- NSR @ 66bpm, nml axis, LAE, poor R wave progression, no acute ischemic changes. No other ECG available to compare.  Assessment/Plan   ICD-9-CM ICD-10-CM   1. Palpitations 785.1 R00.2 EKG 12-Lead     CBC with Differential     CMP     TSH     Urinalysis with Reflex Microscopic  2. Essential hypertension uncontrolled on labetalol and nicardipine 401.9 I10 niCARdipine (CARDENE) 20 MG capsule     CBC with Differential     CMP     TSH     Urinalysis with Reflex Microscopic     losartan (COZAAR) 50 MG tablet  3. 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 Her last CPE was Oct 2015 per pt  Cordella Register. Perlie Gold  [facility] [address] [place], [place] [address] [phone] Office (Wednesdays and Fridays 8 AM - 5 PM) [phone] Cell (Monday-Friday 8 AM - 5 PM)

## 2015-01-23 NOTE — Patient Instructions (Addendum)
Avoid taking extra doses of blood pressure medications  Start taking Aspirin 81 mg daily to keep blood thinned for high blood pressure and history of stroke  Follow up in 1 month to reck BP

## 2015-01-24 ENCOUNTER — Encounter: Payer: Self-pay | Admitting: Internal Medicine

## 2015-01-24 LAB — COMPREHENSIVE METABOLIC PANEL
A/G RATIO: 2.3 (ref 1.1–2.5)
ALT: 23 IU/L (ref 0–32)
AST: 27 IU/L (ref 0–40)
Albumin: 4.9 g/dL (ref 3.5–5.5)
Alkaline Phosphatase: 79 IU/L (ref 39–117)
BILIRUBIN TOTAL: 0.5 mg/dL (ref 0.0–1.2)
BUN / CREAT RATIO: 10 (ref 9–23)
BUN: 13 mg/dL (ref 6–24)
CHLORIDE: 100 mmol/L (ref 97–108)
CO2: 25 mmol/L (ref 18–29)
Calcium: 9.6 mg/dL (ref 8.7–10.2)
Creatinine, Ser: 1.28 mg/dL — ABNORMAL HIGH (ref 0.57–1.00)
GFR calc Af Amer: 53 mL/min/{1.73_m2} — ABNORMAL LOW (ref >59)
GFR, EST NON AFRICAN AMERICAN: 46 mL/min/{1.73_m2} — AB (ref >59)
Globulin, Total: 2.1 g/dL (ref 1.5–4.5)
Glucose: 94 mg/dL (ref 65–99)
POTASSIUM: 4.2 mmol/L (ref 3.5–5.2)
Sodium: 141 mmol/L (ref 134–144)
Total Protein: 7 g/dL (ref 6.0–8.5)

## 2015-01-24 LAB — TSH: TSH: 1.83 u[IU]/mL (ref 0.450–4.500)

## 2015-01-24 LAB — URINALYSIS, ROUTINE W REFLEX MICROSCOPIC
Bilirubin, UA: NEGATIVE
Glucose, UA: NEGATIVE
KETONES UA: NEGATIVE
NITRITE UA: NEGATIVE
Protein, UA: NEGATIVE
RBC, UA: NEGATIVE
SPEC GRAV UA: 1.009 (ref 1.005–1.030)
Urobilinogen, Ur: 0.2 mg/dL (ref 0.2–1.0)
pH, UA: 6.5 (ref 5.0–7.5)

## 2015-01-24 LAB — CBC WITH DIFFERENTIAL/PLATELET
BASOS ABS: 0 10*3/uL (ref 0.0–0.2)
Basos: 1 %
Eos: 1 %
Eosinophils Absolute: 0.1 10*3/uL (ref 0.0–0.4)
HEMATOCRIT: 38.4 % (ref 34.0–46.6)
HEMOGLOBIN: 13.2 g/dL (ref 11.1–15.9)
IMMATURE GRANS (ABS): 0 10*3/uL (ref 0.0–0.1)
IMMATURE GRANULOCYTES: 0 %
Lymphocytes Absolute: 1.6 10*3/uL (ref 0.7–3.1)
Lymphs: 33 %
MCH: 31.4 pg (ref 26.6–33.0)
MCHC: 34.4 g/dL (ref 31.5–35.7)
MCV: 91 fL (ref 79–97)
MONOCYTES: 10 %
MONOS ABS: 0.5 10*3/uL (ref 0.1–0.9)
Neutrophils Absolute: 2.7 10*3/uL (ref 1.4–7.0)
Neutrophils Relative %: 55 %
Platelets: 320 10*3/uL (ref 150–379)
RBC: 4.2 x10E6/uL (ref 3.77–5.28)
RDW: 13.6 % (ref 12.3–15.4)
WBC: 4.9 10*3/uL (ref 3.4–10.8)

## 2015-01-24 LAB — MICROSCOPIC EXAMINATION
Casts: NONE SEEN /lpf
RBC, UA: NONE SEEN /hpf (ref 0–?)

## 2015-03-01 ENCOUNTER — Encounter: Payer: Self-pay | Admitting: Internal Medicine

## 2015-03-01 ENCOUNTER — Ambulatory Visit (INDEPENDENT_AMBULATORY_CARE_PROVIDER_SITE_OTHER): Payer: Medicare Other | Admitting: Internal Medicine

## 2015-03-01 VITALS — BP 136/84 | HR 72 | Temp 97.6°F | Resp 20 | Ht 62.0 in | Wt 206.4 lb

## 2015-03-01 DIAGNOSIS — I10 Essential (primary) hypertension: Secondary | ICD-10-CM

## 2015-03-01 DIAGNOSIS — Z1239 Encounter for other screening for malignant neoplasm of breast: Secondary | ICD-10-CM | POA: Diagnosis not present

## 2015-03-01 DIAGNOSIS — R760 Raised antibody titer: Secondary | ICD-10-CM

## 2015-03-01 DIAGNOSIS — K219 Gastro-esophageal reflux disease without esophagitis: Secondary | ICD-10-CM

## 2015-03-01 DIAGNOSIS — I6930 Unspecified sequelae of cerebral infarction: Secondary | ICD-10-CM | POA: Diagnosis not present

## 2015-03-01 DIAGNOSIS — R790 Abnormal level of blood mineral: Secondary | ICD-10-CM | POA: Diagnosis not present

## 2015-03-01 DIAGNOSIS — G35 Multiple sclerosis: Secondary | ICD-10-CM

## 2015-03-01 MED ORDER — ASPIRIN EC 81 MG PO TBEC: 81.0000 mg | DELAYED_RELEASE_TABLET | Freq: Every day | ORAL | Status: DC

## 2015-03-01 MED ORDER — ESOMEPRAZOLE MAGNESIUM 40 MG PO CPDR: 40.0000 mg | DELAYED_RELEASE_CAPSULE | Freq: Every day | ORAL | Status: DC

## 2015-03-01 MED ORDER — LOSARTAN POTASSIUM 100 MG PO TABS: 100.0000 mg | ORAL_TABLET | Freq: Every day | ORAL | Status: DC

## 2015-03-01 NOTE — Patient Instructions (Signed)
Take all medications as written.  Check blood pressure at home 3 times daily (AM, afternoon and before bed). Bring diary with you to next visit. Call office if BP >200/110  Follow up in 1 month

## 2015-03-01 NOTE — Progress Notes (Signed)
Patient ID: Michele Page, female   DOB: 10-Nov-1956, 59 y.o.   MRN: [id number]    Facility  [facility]    Place of Service:   OFFICE    No Known Allergies  Chief Complaint  Patient presents with  . Follow-up    HTN and CP    HPI:  59 yo female seen today for f/u HTN. No palpitations but she still has intermittent CP. She is NOT taking medications as written but instead is taking them q 2hrs. No HA or dizziness. No SOB. Home SBP 170-180s.  Reviewed old records. She has hx MS and (+) lupus coagulant. She does not take asa daily as her previous pcp recommended.  She is a poor historian due to memory loss. Hx obtained from chart and family  Past Medical History  Diagnosis Date  . Stroke     2013  . Hypertension   . COPD (chronic obstructive pulmonary disease)    No past surgical history on file. History   Social History  . Marital Status: Married    Spouse Name: N/A  . Number of Children: N/A  . Years of Education: N/A   Social History Main Topics  . Smoking status: Never Smoker   . Smokeless tobacco: Never Used  . Alcohol Use: No  . Drug Use: No  . Sexual Activity: No   Other Topics Concern  . None   Social History Narrative   Diet- N/A   Caffeine- Yes   Married- Yes   House- 2 story with 2 people   Pets- No   Current/past profession- Nurse, mental health   Exercise- Yes   Living will-No   DNR-N/A   POA/HPOA-No           Medications: Patient's Medications  New Prescriptions   ASPIRIN EC 81 MG TABLET    Take 1 tablet (81 mg total) by mouth daily.   ESOMEPRAZOLE (NEXIUM) 40 MG CAPSULE    Take 1 capsule (40 mg total) by mouth daily.   LOSARTAN (COZAAR) 100 MG TABLET    Take 1 tablet (100 mg total) by mouth daily.  Previous Medications   LABETALOL (NORMODYNE) 300 MG TABLET    2 by mouth every 6 hours   NICARDIPINE (CARDENE) 20 MG CAPSULE    Take 2 capsules (40 mg total) by mouth every 8 (eight) hours.  Modified Medications   No medications on file    Discontinued Medications   LOSARTAN (COZAAR) 50 MG TABLET    Take 1 tablet (50 mg total) by mouth daily.     Review of Systems  Unable to perform ROS: Mental status change    Filed Vitals:   03/01/15 1034 03/01/15 1108  BP: 158/100 136/84  Pulse: 55 72  Temp: 97.6 F (36.4 C)   TempSrc: Oral   Resp: 20   Height: 5\' 2"  (1.575 m)   Weight: 206 lb 6.4 oz (93.622 kg)   SpO2: 97%    Body mass index is 37.74 kg/(m^2).  Physical Exam  HENT:  Mouth/Throat: Oropharynx is clear and moist. No oropharyngeal exudate.  Neck: Neck supple.  Cardiovascular: Normal rate, regular rhythm, normal heart sounds and intact distal pulses.  Exam reveals no gallop and no friction rub.   No murmur heard. No LE swelling. No calf TTP  Pulmonary/Chest: Effort normal and breath sounds normal. She has no wheezes. She has no rales. She exhibits tenderness.  Lymphadenopathy:    She has no cervical adenopathy.  Neurological: She is alert.  Skin: Skin is warm and dry. No rash noted.  Psychiatric: She has a normal mood and affect. Her behavior is normal. Her speech is slurred (dysarthric).     Labs reviewed: Office Visit on 01/23/2015  Component Date Value Ref Range Status  . WBC 01/23/2015 4.9  3.4 - 10.8 x10E3/uL Final  . RBC 01/23/2015 4.20  3.77 - 5.28 x10E6/uL Final  . Hemoglobin 01/23/2015 13.2  11.1 - 15.9 g/dL Final  . HCT 01/23/2015 38.4  34.0 - 46.6 % Final  . MCV 01/23/2015 91  79 - 97 fL Final  . MCH 01/23/2015 31.4  26.6 - 33.0 pg Final  . MCHC 01/23/2015 34.4  31.5 - 35.7 g/dL Final  . RDW 01/23/2015 13.6  12.3 - 15.4 % Final  . Platelets 01/23/2015 320  150 - 379 x10E3/uL Final  . Neutrophils Relative % 01/23/2015 55   Final  . Lymphs 01/23/2015 33   Final  . Monocytes 01/23/2015 10   Final  . Eos 01/23/2015 1   Final  . Basos 01/23/2015 1   Final  . Neutrophils Absolute 01/23/2015 2.7  1.4 - 7.0 x10E3/uL Final  . Lymphocytes Absolute 01/23/2015 1.6  0.7 - 3.1 x10E3/uL Final  .  Monocytes Absolute 01/23/2015 0.5  0.1 - 0.9 x10E3/uL Final  . Eosinophils Absolute 01/23/2015 0.1  0.0 - 0.4 x10E3/uL Final  . Basophils Absolute 01/23/2015 0.0  0.0 - 0.2 x10E3/uL Final  . Immature Granulocytes 01/23/2015 0   Final  . Immature Grans (Abs) 01/23/2015 0.0  0.0 - 0.1 x10E3/uL Final  . Glucose 01/23/2015 94  65 - 99 mg/dL Final  . BUN 01/23/2015 13  6 - 24 mg/dL Final  . Creatinine, Ser 01/23/2015 1.28* 0.57 - 1.00 mg/dL Final  . GFR calc non Af Amer 01/23/2015 46* >59 mL/min/1.73 Final  . GFR calc Af Amer 01/23/2015 53* >59 mL/min/1.73 Final  . BUN/Creatinine Ratio 01/23/2015 10  9 - 23 Final  . Sodium 01/23/2015 141  134 - 144 mmol/L Final  . Potassium 01/23/2015 4.2  3.5 - 5.2 mmol/L Final  . Chloride 01/23/2015 100  97 - 108 mmol/L Final  . CO2 01/23/2015 25  18 - 29 mmol/L Final  . Calcium 01/23/2015 9.6  8.7 - 10.2 mg/dL Final  . Total Protein 01/23/2015 7.0  6.0 - 8.5 g/dL Final  . Albumin 01/23/2015 4.9  3.5 - 5.5 g/dL Final  . Globulin, Total 01/23/2015 2.1  1.5 - 4.5 g/dL Final  . Albumin/Globulin Ratio 01/23/2015 2.3  1.1 - 2.5 Final  . Bilirubin Total 01/23/2015 0.5  0.0 - 1.2 mg/dL Final  . Alkaline Phosphatase 01/23/2015 79  39 - 117 IU/L Final  . AST 01/23/2015 27  0 - 40 IU/L Final  . ALT 01/23/2015 23  0 - 32 IU/L Final  . TSH 01/23/2015 1.830  0.450 - 4.500 uIU/mL Final  . Specific Gravity, UA 01/23/2015 1.009  1.005 - 1.030 Final  . pH, UA 01/23/2015 6.5  5.0 - 7.5 Final  . Color, UA 01/23/2015 Yellow  Yellow Final  . Appearance Ur 01/23/2015 Clear  Clear Final  . Leukocytes, UA 01/23/2015 2+* Negative Final  . Protein, UA 01/23/2015 Negative  Negative/Trace Final  . Glucose, UA 01/23/2015 Negative  Negative Final  . Ketones, UA 01/23/2015 Negative  Negative Final  . RBC, UA 01/23/2015 Negative  Negative Final  . Bilirubin, UA 01/23/2015 Negative  Negative Final  . Urobilinogen, Ur 01/23/2015 0.2  0.2 - 1.0 mg/dL Final  .  Nitrite, UA 01/23/2015  Negative  Negative Final  . Microscopic Examination 01/23/2015 See below:   Final   Microscopic was indicated and was performed.  . WBC, UA 01/23/2015 0-5  0 -  5 /hpf Final  . RBC, UA 01/23/2015 None seen  0 -  2 /hpf Final  . Epithelial Cells (non renal) 01/23/2015 0-10  0 - 10 /hpf Final  . Casts 01/23/2015 None seen  None seen /lpf Final  . Mucus, UA 01/23/2015 Present  Not Estab. Final  . Bacteria, UA 01/23/2015 Few  None seen/Few Final     Assessment/Plan   ICD-9-CM ICD-10-CM   1. Essential hypertension - stable on meds 401.9 I10   2. Gastroesophageal reflux disease without esophagitis 530.81 K21.9   3. History of stroke with residual deficit 438.9 I69.30   4. Lupus anticoagulant positive 795.79 R79.0   5. Multiple sclerosis  340 G35    --Take all medications as written.  --resume ASA 81mg  daily due to hx (+) LA  --Rx nexium for reflux sx's/atypical CP  --Check blood pressure at home 3 times daily (AM, afternoon and before bed). Bring diary with you to next visit. Call office if BP >200/110  --Neurology referral for MS  --Follow up in 1 month   Olivia Lopez de Gutierrez. Perlie Gold  Dr[facility] [address] [place], [place] [address] [phone] Office (Wednesdays and Fridays 8 AM - 5 PM) [phone] Cell (Monday-Friday 8 AM - 5 PM)

## 2015-03-12 ENCOUNTER — Encounter: Payer: Self-pay | Admitting: Internal Medicine

## 2015-03-20 ENCOUNTER — Encounter: Payer: Self-pay | Admitting: Neurology

## 2015-03-20 ENCOUNTER — Ambulatory Visit (INDEPENDENT_AMBULATORY_CARE_PROVIDER_SITE_OTHER): Payer: Medicare Other | Admitting: Neurology

## 2015-03-20 VITALS — BP 170/96 | HR 70 | Resp 16 | Ht 62.0 in | Wt 207.0 lb

## 2015-03-20 DIAGNOSIS — I69359 Hemiplegia and hemiparesis following cerebral infarction affecting unspecified side: Secondary | ICD-10-CM | POA: Diagnosis not present

## 2015-03-20 DIAGNOSIS — I639 Cerebral infarction, unspecified: Secondary | ICD-10-CM | POA: Diagnosis not present

## 2015-03-20 DIAGNOSIS — I69990 Apraxia following unspecified cerebrovascular disease: Secondary | ICD-10-CM | POA: Insufficient documentation

## 2015-03-20 DIAGNOSIS — I69320 Aphasia following cerebral infarction: Secondary | ICD-10-CM | POA: Insufficient documentation

## 2015-03-20 DIAGNOSIS — R0683 Snoring: Secondary | ICD-10-CM | POA: Diagnosis not present

## 2015-03-20 DIAGNOSIS — G35 Multiple sclerosis: Secondary | ICD-10-CM

## 2015-03-20 DIAGNOSIS — I10 Essential (primary) hypertension: Secondary | ICD-10-CM

## 2015-03-20 DIAGNOSIS — F09 Unspecified mental disorder due to known physiological condition: Secondary | ICD-10-CM | POA: Diagnosis not present

## 2015-03-20 DIAGNOSIS — I69920 Aphasia following unspecified cerebrovascular disease: Secondary | ICD-10-CM | POA: Diagnosis not present

## 2015-03-20 DIAGNOSIS — R351 Nocturia: Secondary | ICD-10-CM | POA: Diagnosis not present

## 2015-03-20 NOTE — Progress Notes (Signed)
[facility]  PATIENT: Michele Page DOB: 20-Sep-1956  REFERRING DOCTOR OR PCP:  Gildardo Cranker SOURCE: patient  _________________________________   HISTORICAL  CHIEF COMPLAINT:  Chief Complaint  Patient presents with  . Multiple Sclerosis    Sts. dx. with MS 7-9 yrs. ago. Sts. presenting sx. were numbness in hands.  She was living in [place] at the time, sts mri showed ms lesions.  She believes an lp was done but isn't sure.  She saw a neurologist in [place] (can't recall name).  Sts. she has never been on any MS med, and sts. she doesn't think she needs to.  Sts. no sx. have worsened.  Thinks last mri was 2 yrs. ago. She also has a hx. of cva 7-8 yrs. ago.  She has some difficulty writing--not sure if this is r/t MS or CVA/fim    HISTORY OF PRESENT ILLNESS:  I had the pleasure of seeing your patient, Michele Page, at [facility] for neurologic consultation regarding her multiple sclerosis.   About 8 years ago, she had the onset of left sided numbness. The symptoms came on fairly quickly. They resolved over 2-3 weeks. She had an MRI at the time that showed lesions consistent with MS. She thinks she may have had a lumbar puncture as well. Her doctor at the time advised that she start medications for MS. However, all of the options were self injectable at that time and she preferred not to begin any of the therapies. She feels that she has done fairly well since that time and denies any new exacerbations. A few months ago, she moved down to [place] from [place].  She has elevated blood pressure and had a stroke 3 years ago. She had the sudden onset of right arm and leg weakness. Additionally, she was having a lot of speech difficulties initially. Over time, the weakness improved and her speech improved, though she does not feel that they got back to baseline. She notes difficulty with handwriting and other skilled tasks with the right hand, even  though she feels her strength is fine.Marland Kitchen MRI was performed in the hospital at that time (she does not remember where). Her symptoms and imaging studies were felt to be more consistent with stroke than to an MS exacerbation. She was placed on aspirin and continues to take 1 baby aspirin daily.  Gait/strength/sensation: The stroke 3 years ago she has had some difficulty with her walking and feels that she has a mild limp. She notes mild weakness in the right leg. She denies any numbness in either leg. She denies any actual weakness in the right arm but notes that since his stroke that her handwriting has been poor.  Bladder/Bowel:   She denies any significant bowel or bladder issue during the day but has 4 times nocturia at night.  Vision: She denies any significant problems with her vision. She wears glasses for correction but notes no MS related vision problem. There is no diplopia.   Fatigue/Sleep:   She denies any significant fatigue and notes no major difficulty with heat. She falls asleep easily but wakes up multiple times at night to use the bathroom. She usually falls back asleep.  She snores but denies any excessive daytime sleepiness. She does not believe her husband has ever told her that she has pauses or gasping at night.  Mood/Cognition:   She denies anxiety or depression.     She notes some cognitive difficulty with cognition.  She notes that she has difficulty remembering what she reads for mor ethan a couple minutes.   She notes some difficulty coming up with the right words.   She also feels processing speed is reduced.  Labs were reviewed:   She has elevated creatinine of 1.28 and reduced GFR of 53 CBC and TSH were fine. REVIEW OF SYSTEMS: Constitutional: No fevers, chills, sweats, or change in appetite Eyes: No visual changes, double vision, eye pain Ear, nose and throat: No hearing loss, ear pain, nasal congestion, sore throat Cardiovascular: No chest pain,  palpitations Respiratory: No shortness of breath at rest or with exertion.   No wheezes GastrointestinaI: No nausea, vomiting, diarrhea, abdominal pain, fecal incontinence Genitourinary: No dysuria, urinary retention or frequency.  She has nocturia. She has mildly elevated creatinine  Musculoskeletal: No neck pain, back pain Integumentary: No rash, pruritus, skin lesions Neurological: as above Psychiatric: No depression at this time.  No anxiety.  She notes some memory loss. Endocrine: No palpitations, diaphoresis, change in appetite, change in weigh or increased thirst Hematologic/Lymphatic: No anemia, purpura, petechiae. Allergic/Immunologic: No itchy/runny eyes, nasal congestion, recent allergic reactions, rashes  ALLERGIES: No Known Allergies  HOME MEDICATIONS:  Current outpatient prescriptions:  .  aspirin EC 81 MG tablet, Take 1 tablet (81 mg total) by mouth daily., Disp: 30 tablet, Rfl: 6 .  labetalol (NORMODYNE) 300 MG tablet, 2 by mouth every 6 hours, Disp: , Rfl:  .  niCARdipine (CARDENE) 20 MG capsule, Take 2 capsules (40 mg total) by mouth every 8 (eight) hours., Disp: 180 capsule, Rfl: 4 .  esomeprazole (NEXIUM) 40 MG capsule, Take 1 capsule (40 mg total) by mouth daily. (Patient not taking: Reported on 03/20/2015), Disp: 30 capsule, Rfl: 3  PAST MEDICAL HISTORY: Past Medical History  Diagnosis Date  . Stroke     2013  . Hypertension   . COPD (chronic obstructive pulmonary disease)   . Multiple sclerosis   . Vision abnormalities     PAST SURGICAL HISTORY: History reviewed. No pertinent past surgical history.  FAMILY HISTORY: Family History  Problem Relation Age of Onset  . Stroke Sister   . Alcohol abuse Brother   . HIV/AIDS Brother     SOCIAL HISTORY:  History   Social History  . Marital Status: Married    Spouse Name: N/A  . Number of Children: N/A  . Years of Education: N/A   Occupational History  . Not on file.   Social History Main Topics   . Smoking status: Never Smoker   . Smokeless tobacco: Never Used  . Alcohol Use: No  . Drug Use: No  . Sexual Activity: No   Other Topics Concern  . Not on file   Social History Narrative   Diet- N/A   Caffeine- Yes   Married- Yes   House- 2 story with 2 people   Pets- No   Current/past profession- Nurse, mental health   Exercise- Yes   Living will-No   DNR-N/A   POA/HPOA-No           PHYSICAL EXAM  Filed Vitals:   03/20/15 1408  BP: 170/96  Pulse: 70  Resp: 16  Height: 5\' 2"  (1.575 m)  Weight: 207 lb (93.895 kg)    Body mass index is 37.85 kg/(m^2).   General: The patient is well-developed and well-nourished and in no acute distress  Eyes:  Funduscopic exam shows normal optic discs and retinal vessels.  Neck: The neck is supple, no carotid bruits  are noted.  The neck is nontender.  Cardiovascular: The heart has a regular rate and rhythm with a normal S1 and S2. There were no murmurs, gallops or rubs. Lungs are clear to auscultation.  Skin: Extremities are without significant edema.  Musculoskeletal:  Back is nontender  Neurologic Exam  Mental status: The patient is alert and oriented x 3 at the time of the examination. The patient has reduced recent and remote memory, and a reduced  apparently attention span and concentration ability.   Speech has a few errors.  She understands well.  She has a right hand apraxia.  Cranial nerves: Extraocular movements are full. Pupils are equal, round, and reactive to light and accomodation.  Visual fields are full.  Facial symmetry is present. There is good facial sensation to soft touch bilaterally.Facial strength is normal.  Trapezius and sternocleidomastoid strength is normal. No dysarthria is noted.  The tongue is midline, and the patient has symmetric elevation of the soft palate. No obvious hearing deficits are noted.  Motor:  Muscle bulk is normal.   Tone is normal. Strength is  5 / 5 in  both arms and the left leg  and 4+ over 5 in the right leg.   Sensory: Sensory testing is intact to pinprick, soft touch and vibration sensation in all 4 extremities.  Coordination: Cerebellar testing reveals good  left and mildly reduced right finger-nose-finger and  poor right heel-to-shin . She has difficulty writing  Gait and station: Station is normal.   Gait is  wide stance with a normal stride.  Tandem gait is  Wide and requires support.   Romberg is negative.   Reflexes: Deep tendon reflexes are symmetric and normal bilaterally in arms and slightly elevated in the right leg compared to the left.   Plantar responses are flexor.    DIAGNOSTIC DATA (LABS, IMAGING, TESTING) - I reviewed patient records, labs, notes, testing and imaging myself where available.  Lab Results  Component Value Date   WBC 4.9 01/23/2015   HGB 13.2 01/23/2015   HCT 38.4 01/23/2015   MCV 91 01/23/2015   PLT 320 01/23/2015      Component Value Date/Time   NA 141 01/23/2015 1014   NA 141 11/05/2014 1254   K 4.2 01/23/2015 1014   CL 100 01/23/2015 1014   CO2 25 01/23/2015 1014   GLUCOSE 94 01/23/2015 1014   GLUCOSE 117* 11/05/2014 1254   BUN 13 01/23/2015 1014   BUN 16 11/05/2014 1254   CREATININE 1.28* 01/23/2015 1014   CALCIUM 9.6 01/23/2015 1014   PROT 7.0 01/23/2015 1014   PROT 7.3 08/19/2014 1325   ALBUMIN 4.1 08/19/2014 1325   AST 27 01/23/2015 1014   ALT 23 01/23/2015 1014   ALKPHOS 79 01/23/2015 1014   BILITOT 0.5 01/23/2015 1014   BILITOT 0.4 08/19/2014 1325   GFRNONAA 46* 01/23/2015 1014   GFRAA 53* 01/23/2015 1014    Lab Results  Component Value Date   TSH 1.830 01/23/2015       ASSESSMENT AND PLAN  Multiple sclerosis  Accelerated hypertension  Cerebral infarction due to unspecified mechanism  Hemiplegia following CVA (cerebrovascular accident)  Cognitive disorder  Nocturia  Snoring  Aphasia S/P CVA  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   Most of her current problems appear to be related to  the stroke 3 years ago. Specifically she has right arm apraxia, right foot drop.   Changes in cognition could be due to a combination of the prior stroke and the MS.  We discussed therapies for MS. She would prefer not to go on any drug at this point but would consider if her MS becomes more active. I will go ahead and MRI of the brain with and without contrast. If there is any evidence of more acute activity then I will recommend that she go on therapy. That MRI will also help me to assess her ability to recover further from her prior stroke.  BP was elevated today and she notes that she will be seeing her primary care provider next week. She will ask her husband if she has any gasping or apnea at night. If present, I would consider obtaining a sleep study as untreated OSA can be a risk factor for stroke and poorly controlled hypertension  She will return to see me in 4 months and call sooner if she has new or worsening neurologic symptoms. We will call her couple days after the MRI to go over the results. If MS is noted, I will have her come in sooner to further discuss therapy options. If she recalls where her last MRI was performed I will try to compare this MRI with her last one.   Richard A. Felecia Shelling, MD, PhD 5/0/5697, 9:48 PM Certified in Neurology, Clinical Neurophysiology, Sleep Medicine, Pain Medicine and Neuroimaging  [facility] [address], [place], [place] [address] [phone]

## 2015-03-27 ENCOUNTER — Encounter: Payer: Self-pay | Admitting: Internal Medicine

## 2015-03-27 ENCOUNTER — Ambulatory Visit (INDEPENDENT_AMBULATORY_CARE_PROVIDER_SITE_OTHER): Payer: Medicare Other | Admitting: Internal Medicine

## 2015-03-27 VITALS — BP 120/86 | HR 72 | Temp 98.2°F | Resp 18 | Ht 62.0 in | Wt 209.4 lb

## 2015-03-27 DIAGNOSIS — R1084 Generalized abdominal pain: Secondary | ICD-10-CM | POA: Diagnosis not present

## 2015-03-27 DIAGNOSIS — K219 Gastro-esophageal reflux disease without esophagitis: Secondary | ICD-10-CM | POA: Diagnosis not present

## 2015-03-27 DIAGNOSIS — J301 Allergic rhinitis due to pollen: Secondary | ICD-10-CM | POA: Diagnosis not present

## 2015-03-27 DIAGNOSIS — M25512 Pain in left shoulder: Secondary | ICD-10-CM | POA: Diagnosis not present

## 2015-03-27 DIAGNOSIS — I639 Cerebral infarction, unspecified: Secondary | ICD-10-CM | POA: Diagnosis not present

## 2015-03-27 MED ORDER — METHYLPREDNISOLONE ACETATE 40 MG/ML IJ SUSP: 80.0000 mg | Freq: Once | INTRAMUSCULAR | Status: DC

## 2015-03-27 MED ORDER — METHYLPREDNISOLONE ACETATE 80 MG/ML IJ SUSP
80.0000 mg | Freq: Once | INTRAMUSCULAR | Status: AC
  Administered 2015-03-27: 80 mg via INTRAMUSCULAR

## 2015-03-27 MED ORDER — TRAMADOL HCL 50 MG PO TABS: 50.0000 mg | ORAL_TABLET | Freq: Four times a day (QID) | ORAL | Status: DC | PRN

## 2015-03-27 NOTE — Patient Instructions (Addendum)
Push fluids and rest  Avoid heavy lifting and reduce upper body workout reps by half until seen by Ortho  Try OTC plain claritin, allegra or zyrtec daily for seasonal allergy  Use saline nasal spray as needed to keep nose moist  Will call with referral appts with Ortho and GI  Follow up as scheduled

## 2015-03-27 NOTE — Progress Notes (Signed)
Patient ID: Michele Page, female   DOB: 08-08-1956, 59 y.o.   MRN: [id number]    Location:    [facility]   Place of Service:   OFFICE    Chief Complaint  Patient presents with  . Acute Visit    Patient c/o Knot on left arm    HPI:  59 yo female seen today for left arm pain x 2 weeks. No known injury. She goes to gym and does upper and lower body workout using equipment provided. She has not changed her workout routine in several months. She has tingling in LUE. No numbness. She has difficulty raising left shoulder. She feels a knot in bicep muscle.   She continues to have abdominal pain not completely relieved with nexium. PPI helped acid reflux sx's. She feels like it is worsening. She would like to see GI  She has a hx MS and (+) lupus anticoagulant  Past Medical History  Diagnosis Date  . Stroke     2013  . Hypertension   . COPD (chronic obstructive pulmonary disease)   . Multiple sclerosis   . Vision abnormalities     History reviewed. No pertinent past surgical history.  Patient Care Team: Gildardo Cranker, DO as PCP - General (Internal Medicine)  History   Social History  . Marital Status: Married    Spouse Name: N/A  . Number of Children: N/A  . Years of Education: N/A   Occupational History  . Not on file.   Social History Main Topics  . Smoking status: Never Smoker   . Smokeless tobacco: Never Used  . Alcohol Use: No  . Drug Use: No  . Sexual Activity: No   Other Topics Concern  . Not on file   Social History Narrative   Diet- N/A   Caffeine- Yes   Married- Yes   House- 2 story with 2 people   Pets- No   Current/past profession- Insurance underwriter daycare   Exercise- Yes   Living will-No   DNR-N/A   POA/HPOA-No           reports that she has never smoked. She has never used smokeless tobacco. She reports that she does not drink alcohol or use illicit drugs.  No Known Allergies  Medications: Patient's Medications  New Prescriptions   No medications  on file  Previous Medications   ASPIRIN EC 81 MG TABLET    Take 1 tablet (81 mg total) by mouth daily.   ESOMEPRAZOLE (NEXIUM) 40 MG CAPSULE    Take 1 capsule (40 mg total) by mouth daily.   LABETALOL (NORMODYNE) 300 MG TABLET    2 by mouth every 6 hours   NICARDIPINE (CARDENE) 20 MG CAPSULE    Take 2 capsules (40 mg total) by mouth every 8 (eight) hours.  Modified Medications   No medications on file  Discontinued Medications   No medications on file    Review of Systems  Constitutional: Positive for fever. Negative for activity change, appetite change and unexpected weight change.  HENT: Positive for postnasal drip. Negative for ear pain, sore throat and trouble swallowing.   Respiratory: Positive for cough (with green sputum). Negative for shortness of breath and wheezing.   Gastrointestinal: Positive for abdominal pain. Negative for nausea, vomiting, blood in stool and abdominal distention.  Musculoskeletal: Positive for arthralgias (left shoulder).  All other systems reviewed and are negative.   Filed Vitals:   03/27/15 1109  BP: 120/86  Pulse: 72  Temp: 98.2 F (  36.8 C)  TempSrc: Oral  Resp: 18  Height: 5\' 2"  (1.575 m)  Weight: 209 lb 6.4 oz (94.983 kg)  SpO2: 93%   Body mass index is 38.29 kg/(m^2).  Physical Exam  Constitutional: She appears well-developed and well-nourished. No distress.  Looks well in NAD  HENT:  Mouth/Throat: No oropharyngeal exudate (cobblestoning appearance).  Eyes: Pupils are equal, round, and reactive to light. Right eye exhibits no discharge. Left eye exhibits no discharge. No scleral icterus.  Cardiovascular: Normal rate, regular rhythm, normal heart sounds and intact distal pulses.  Exam reveals no gallop and no friction rub.   No murmur heard. Pulmonary/Chest: Effort normal and breath sounds normal. No respiratory distress. She has no wheezes. She has no rales. She exhibits no tenderness.  Abdominal: Bowel sounds are normal. She exhibits  no distension and no mass. There is no hepatosplenomegaly. There is no tenderness. There is no rebound and no guarding.  Musculoskeletal: She exhibits edema and tenderness.       Right shoulder: She exhibits crepitus. She exhibits normal range of motion, no tenderness, no swelling, no effusion and no spasm.       Left shoulder: She exhibits decreased range of motion, tenderness, swelling (anteriorly), crepitus, pain and spasm. She exhibits no bony tenderness, no effusion, no deformity and normal pulse.       Left elbow: Normal.       Arms: Lymphadenopathy:    She has no cervical adenopathy.  Neurological: She is alert.  Skin: Skin is warm and dry. No rash noted.  Psychiatric: She has a normal mood and affect. Her speech is normal and behavior is normal.     Labs reviewed: Office Visit on 01/23/2015  Component Date Value Ref Range Status  . WBC 01/23/2015 4.9  3.4 - 10.8 x10E3/uL Final  . RBC 01/23/2015 4.20  3.77 - 5.28 x10E6/uL Final  . Hemoglobin 01/23/2015 13.2  11.1 - 15.9 g/dL Final  . HCT 01/23/2015 38.4  34.0 - 46.6 % Final  . MCV 01/23/2015 91  79 - 97 fL Final  . MCH 01/23/2015 31.4  26.6 - 33.0 pg Final  . MCHC 01/23/2015 34.4  31.5 - 35.7 g/dL Final  . RDW 01/23/2015 13.6  12.3 - 15.4 % Final  . Platelets 01/23/2015 320  150 - 379 x10E3/uL Final  . Neutrophils Relative % 01/23/2015 55   Final  . Lymphs 01/23/2015 33   Final  . Monocytes 01/23/2015 10   Final  . Eos 01/23/2015 1   Final  . Basos 01/23/2015 1   Final  . Neutrophils Absolute 01/23/2015 2.7  1.4 - 7.0 x10E3/uL Final  . Lymphocytes Absolute 01/23/2015 1.6  0.7 - 3.1 x10E3/uL Final  . Monocytes Absolute 01/23/2015 0.5  0.1 - 0.9 x10E3/uL Final  . Eosinophils Absolute 01/23/2015 0.1  0.0 - 0.4 x10E3/uL Final  . Basophils Absolute 01/23/2015 0.0  0.0 - 0.2 x10E3/uL Final  . Immature Granulocytes 01/23/2015 0   Final  . Immature Grans (Abs) 01/23/2015 0.0  0.0 - 0.1 x10E3/uL Final  . Glucose 01/23/2015 94  65  - 99 mg/dL Final  . BUN 01/23/2015 13  6 - 24 mg/dL Final  . Creatinine, Ser 01/23/2015 1.28* 0.57 - 1.00 mg/dL Final  . GFR calc non Af Amer 01/23/2015 46* >59 mL/min/1.73 Final  . GFR calc Af Amer 01/23/2015 53* >59 mL/min/1.73 Final  . BUN/Creatinine Ratio 01/23/2015 10  9 - 23 Final  . Sodium 01/23/2015 141  134 - 144  mmol/L Final  . Potassium 01/23/2015 4.2  3.5 - 5.2 mmol/L Final  . Chloride 01/23/2015 100  97 - 108 mmol/L Final  . CO2 01/23/2015 25  18 - 29 mmol/L Final  . Calcium 01/23/2015 9.6  8.7 - 10.2 mg/dL Final  . Total Protein 01/23/2015 7.0  6.0 - 8.5 g/dL Final  . Albumin 01/23/2015 4.9  3.5 - 5.5 g/dL Final  . Globulin, Total 01/23/2015 2.1  1.5 - 4.5 g/dL Final  . Albumin/Globulin Ratio 01/23/2015 2.3  1.1 - 2.5 Final  . Bilirubin Total 01/23/2015 0.5  0.0 - 1.2 mg/dL Final  . Alkaline Phosphatase 01/23/2015 79  39 - 117 IU/L Final  . AST 01/23/2015 27  0 - 40 IU/L Final  . ALT 01/23/2015 23  0 - 32 IU/L Final  . TSH 01/23/2015 1.830  0.450 - 4.500 uIU/mL Final  . Specific Gravity, UA 01/23/2015 1.009  1.005 - 1.030 Final  . pH, UA 01/23/2015 6.5  5.0 - 7.5 Final  . Color, UA 01/23/2015 Yellow  Yellow Final  . Appearance Ur 01/23/2015 Clear  Clear Final  . Leukocytes, UA 01/23/2015 2+* Negative Final  . Protein, UA 01/23/2015 Negative  Negative/Trace Final  . Glucose, UA 01/23/2015 Negative  Negative Final  . Ketones, UA 01/23/2015 Negative  Negative Final  . RBC, UA 01/23/2015 Negative  Negative Final  . Bilirubin, UA 01/23/2015 Negative  Negative Final  . Urobilinogen, Ur 01/23/2015 0.2  0.2 - 1.0 mg/dL Final  . Nitrite, UA 01/23/2015 Negative  Negative Final  . Microscopic Examination 01/23/2015 See below:   Final   Microscopic was indicated and was performed.  . WBC, UA 01/23/2015 0-5  0 -  5 /hpf Final  . RBC, UA 01/23/2015 None seen  0 -  2 /hpf Final  . Epithelial Cells (non renal) 01/23/2015 0-10  0 - 10 /hpf Final  . Casts 01/23/2015 None seen  None  seen /lpf Final  . Mucus, UA 01/23/2015 Present  Not Estab. Final  . Bacteria, UA 01/23/2015 Few  None seen/Few Final    No results found.   Assessment/Plan   ICD-9-CM ICD-10-CM   1. Left shoulder pain - probable strain/sprain 719.41 M25.512 methylPREDNISolone acetate (DEPO-MEDROL) injection 80 mg     traMADol (ULTRAM) 50 MG tablet     Ambulatory referral to Orthopedic Surgery     DG Shoulder Left  2. Gastroesophageal reflux disease without esophagitis - improved; cont med 530.81 K21.9 Ambulatory referral to Gastroenterology  3. Generalized abdominal pain - etiology unknown 789.07 R10.84 Ambulatory referral to Gastroenterology  4. Allergic rhinitis due to pollen 477.0 J30.1 methylPREDNISolone acetate (DEPO-MEDROL) injection 80 mg   --Push fluids and rest  --Avoid heavy lifting and reduce upper body workout reps by half until seen by Ortho  --Try OTC plain claritin, allegra or zyrtec daily for seasonal allergy  --Use saline nasal spray as needed to keep nose moist  --Will call with referral appts with Ortho and GI  --Follow up as scheduled   Michele Page  [facility] [address] [place], [place] [address] [phone] Cell (Monday-Friday 8 AM - 5 PM) [phone] After 5 PM and follow prompts

## 2015-03-29 ENCOUNTER — Ambulatory Visit: Payer: Medicare Other | Admitting: Internal Medicine

## 2015-04-02 ENCOUNTER — Other Ambulatory Visit: Payer: Self-pay | Admitting: Orthopedic Surgery

## 2015-04-02 DIAGNOSIS — M25512 Pain in left shoulder: Secondary | ICD-10-CM

## 2015-04-03 ENCOUNTER — Ambulatory Visit: Payer: Medicare Other | Admitting: Internal Medicine

## 2015-04-08 ENCOUNTER — Telehealth: Payer: Self-pay | Admitting: *Deleted

## 2015-04-08 NOTE — Telephone Encounter (Signed)
Received fax from [employer] # [phone] and they have denied coverage for Esomeprazole Magnedium.  Dr. Eulas Post Noted.  Member Id#: [id number]

## 2015-04-12 ENCOUNTER — Ambulatory Visit: Payer: Medicare Other | Admitting: Internal Medicine

## 2015-04-20 ENCOUNTER — Ambulatory Visit
Admission: RE | Admit: 2015-04-20 | Discharge: 2015-04-20 | Disposition: A | Discharge status: Home / Self Care | Payer: Medicare Other | Source: Ambulatory Visit | Attending: Orthopedic Surgery | Admitting: Orthopedic Surgery

## 2015-04-20 DIAGNOSIS — M7582 Other shoulder lesions, left shoulder: Secondary | ICD-10-CM | POA: Diagnosis not present

## 2015-04-20 DIAGNOSIS — M7552 Bursitis of left shoulder: Secondary | ICD-10-CM | POA: Diagnosis not present

## 2015-04-20 DIAGNOSIS — M75102 Unspecified rotator cuff tear or rupture of left shoulder, not specified as traumatic: Secondary | ICD-10-CM | POA: Diagnosis not present

## 2015-04-20 DIAGNOSIS — M25512 Pain in left shoulder: Secondary | ICD-10-CM

## 2015-04-20 DIAGNOSIS — M25412 Effusion, left shoulder: Secondary | ICD-10-CM | POA: Diagnosis not present

## 2015-04-25 DIAGNOSIS — M25512 Pain in left shoulder: Secondary | ICD-10-CM | POA: Diagnosis not present

## 2015-05-02 DIAGNOSIS — M25512 Pain in left shoulder: Secondary | ICD-10-CM | POA: Diagnosis not present

## 2015-05-02 DIAGNOSIS — R531 Weakness: Secondary | ICD-10-CM | POA: Diagnosis not present

## 2015-05-03 ENCOUNTER — Ambulatory Visit (INDEPENDENT_AMBULATORY_CARE_PROVIDER_SITE_OTHER): Payer: Medicare Other | Admitting: Internal Medicine

## 2015-05-03 ENCOUNTER — Encounter: Payer: Self-pay | Admitting: Internal Medicine

## 2015-05-03 VITALS — BP 130/92 | HR 76 | Temp 98.1°F | Resp 20 | Ht 62.0 in | Wt 206.0 lb

## 2015-05-03 DIAGNOSIS — I6930 Unspecified sequelae of cerebral infarction: Secondary | ICD-10-CM | POA: Diagnosis not present

## 2015-05-03 DIAGNOSIS — G35 Multiple sclerosis: Secondary | ICD-10-CM | POA: Diagnosis not present

## 2015-05-03 DIAGNOSIS — M25512 Pain in left shoulder: Secondary | ICD-10-CM | POA: Diagnosis not present

## 2015-05-03 DIAGNOSIS — I10 Essential (primary) hypertension: Secondary | ICD-10-CM

## 2015-05-03 DIAGNOSIS — I639 Cerebral infarction, unspecified: Secondary | ICD-10-CM

## 2015-05-03 MED ORDER — LABETALOL HCL 300 MG PO TABS: 600.0000 mg | ORAL_TABLET | Freq: Three times a day (TID) | ORAL | Status: DC

## 2015-05-03 NOTE — Patient Instructions (Signed)
Continue current medications as ordered  Follow up with Ortho as scheduled  Follow up in 3 mos for routine visit with Dr Eulas Post

## 2015-05-03 NOTE — Progress Notes (Signed)
Patient ID: Michele Page, female   DOB: 06/27/1956, 59 y.o.   MRN: [id number]    Location:    [facility]   Place of Service:   OFFICE  Chief Complaint  Patient presents with  . Medical Management of Chronic Issues    1 month follow-up    HPI:  59 yo female seen today for f/u HTN. She is taking labetalol, losartan and cardene. No CP, SOB. Occasional palpitations. No HA or dizziness. No leg cramps. No new leg swelling  She saw Ortho and was told she needs left shoulder sx to repair rotator cuff. She is hesitant to do so  Past Medical History  Diagnosis Date  . Stroke     2013  . Hypertension   . COPD (chronic obstructive pulmonary disease)   . Multiple sclerosis   . Vision abnormalities     History reviewed. No pertinent past surgical history.  Patient Care Team: Gildardo Cranker, DO as PCP - General (Internal Medicine)  History   Social History  . Marital Status: Married    Spouse Name: N/A  . Number of Children: N/A  . Years of Education: N/A   Occupational History  . Not on file.   Social History Main Topics  . Smoking status: Never Smoker   . Smokeless tobacco: Never Used  . Alcohol Use: No  . Drug Use: No  . Sexual Activity: No   Other Topics Concern  . Not on file   Social History Narrative   Diet- N/A   Caffeine- Yes   Married- Yes   House- 2 story with 2 people   Pets- No   Current/past profession- Insurance underwriter daycare   Exercise- Yes   Living will-No   DNR-N/A   POA/HPOA-No           reports that she has never smoked. She has never used smokeless tobacco. She reports that she does not drink alcohol or use illicit drugs.  No Known Allergies  Medications: Patient's Medications  New Prescriptions   No medications on file  Previous Medications   ASPIRIN EC 81 MG TABLET    Take 1 tablet (81 mg total) by mouth daily.   ESOMEPRAZOLE (NEXIUM) 40 MG CAPSULE    Take 1 capsule (40 mg total) by mouth daily.   LABETALOL (NORMODYNE) 300 MG TABLET    2 by  mouth every 6 hours   LOSARTAN (COZAAR) 100 MG TABLET    Take 100 mg by mouth daily.   NICARDIPINE (CARDENE) 20 MG CAPSULE    Take 2 capsules (40 mg total) by mouth every 8 (eight) hours.   TRAMADOL (ULTRAM) 50 MG TABLET    Take 1 tablet (50 mg total) by mouth every 6 (six) hours as needed for moderate pain or severe pain.  Modified Medications   No medications on file  Discontinued Medications   No medications on file    Review of Systems  Unable to perform ROS: Other  aphasia  Filed Vitals:   05/03/15 0919  BP: 130/92  Pulse: 76  Temp: 98.1 F (36.7 C)  TempSrc: Oral  Resp: 20  Height: 5\' 2"  (1.575 m)  Weight: 206 lb (93.441 kg)  SpO2: 96%   Body mass index is 37.67 kg/(m^2).  Physical Exam  Constitutional: She is oriented to person, place, and time. She appears well-developed and well-nourished.  HENT:  Mouth/Throat: Oropharynx is clear and moist. No oropharyngeal exudate.  Eyes: Pupils are equal, round, and reactive to light. No scleral  icterus.  Neck: Neck supple. Carotid bruit is not present. No tracheal deviation present. No thyromegaly present.  Cardiovascular: Normal rate, regular rhythm, normal heart sounds and intact distal pulses.  Exam reveals no gallop and no friction rub.   No murmur heard. No LE edema b/l. no calf TTP.   Pulmonary/Chest: Effort normal and breath sounds normal. No stridor. No respiratory distress. She has no wheezes. She has no rales.  Abdominal: Soft. Bowel sounds are normal. She exhibits no distension and no mass. There is no tenderness. There is no rebound and no guarding.  Musculoskeletal: She exhibits edema and tenderness.  Left shoulder reduced ROM with TTP  Lymphadenopathy:    She has no cervical adenopathy.  Neurological: She is alert and oriented to person, place, and time. She displays no tremor.  Expressive aphasia. Left hemiparesis  Skin: Skin is warm and dry. No rash noted.  Psychiatric: She has a normal mood and affect. Her  behavior is normal. Judgment and thought content normal.     Labs reviewed: Scanned Document on 03/27/2015  Component Date Value Ref Range Status  . INR 03/20/2014 1.0  0.9 - 1.1 Final    Mr Shoulder Left Wo Contrast  04/20/2015   CLINICAL DATA:  Three-month history of left shoulder pain and limited range of motion. Popping and grinding.  EXAM: MRI OF THE LEFT SHOULDER WITHOUT CONTRAST  TECHNIQUE: Multiplanar, multisequence MR imaging of the shoulder was performed. No intravenous contrast was administered.  COMPARISON:  None.  FINDINGS: Rotator cuff: Significant rotator cuff tendinopathy/ tendinosis with a fairly extensive articular surface tear involving the supraspinatus tendon with approximately 16 mm of laminar retraction of the articular fibers. This is also fairly deep tear but does not involve the bursal surface. Shallow articular surface tears also involve the infraspinatus tendon. The subscapularis tendon is intact.  Muscles:  Normal.  Biceps long head: Intact. Tendinopathy involving the intra-articular portion.  Acromioclavicular Joint: Mild to moderate AC joint degenerative changes. The acromion is type 2 in shape. No lateral downsloping or undersurface spurring.  Glenohumeral Joint: Moderate degenerative changes with a small joint effusion and mild synovitis. Subchondral cystic change noted involving the greater tuberosity.  Labrum:  Grossly normal.  Bones:  No acute bony findings.  IMPRESSION: 1. Significant rotator cuff tendinopathy/tendinosis with a fairly extensive articular surface tear involving the supraspinatus tendon and shallow articular surface tears involving the infraspinatus tendon. No definite full-thickness retracted tear. 2. Intact long head biceps tendon and glenoid labrum. 3. No significant findings for bony impingement. 4. Moderate glenohumeral joint degenerative changes with small joint effusion and mild synovitis. 5. Mild subacromial/subdeltoid bursitis.   Electronically  Signed   By: Marijo Sanes M.D.   On: 04/20/2015 17:00     Assessment/Plan   ICD-9-CM ICD-10-CM   1. Essential hypertension - stable 401.9 I10 labetalol (NORMODYNE) 300 MG tablet     Basic Metabolic Panel  2. History of stroke with residual deficit - stable 438.9 T41.96 Basic Metabolic Panel     Lipid Panel  3. 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 Multiple sclerosis - stable 340 G35    --Continue current medications as ordered  --Follow up with Ortho as scheduled. Recommend she proceeds with surgical repair  --Follow up in 3 mos for routine visit with Dr Arletha Grippe. Perlie Gold  [facility] [place],  [place] [address] [phone] Cell (Monday-Friday 8 AM - 5 PM) [phone] After 5 PM and follow prompts

## 2015-05-09 DIAGNOSIS — M25512 Pain in left shoulder: Secondary | ICD-10-CM | POA: Diagnosis not present

## 2015-05-16 ENCOUNTER — Other Ambulatory Visit: Payer: Medicare Other

## 2015-05-16 DIAGNOSIS — I10 Essential (primary) hypertension: Secondary | ICD-10-CM | POA: Diagnosis not present

## 2015-05-16 DIAGNOSIS — I6930 Unspecified sequelae of cerebral infarction: Secondary | ICD-10-CM | POA: Diagnosis not present

## 2015-05-17 LAB — BASIC METABOLIC PANEL
BUN/Creatinine Ratio: 14 (ref 9–23)
BUN: 24 mg/dL (ref 6–24)
CALCIUM: 10.2 mg/dL (ref 8.7–10.2)
CO2: 25 mmol/L (ref 18–29)
Chloride: 101 mmol/L (ref 97–108)
Creatinine, Ser: 1.66 mg/dL — ABNORMAL HIGH (ref 0.57–1.00)
GFR calc Af Amer: 39 mL/min/{1.73_m2} — ABNORMAL LOW (ref >59)
GFR calc non Af Amer: 33 mL/min/{1.73_m2} — ABNORMAL LOW (ref >59)
Glucose: 94 mg/dL (ref 65–99)
POTASSIUM: 4.1 mmol/L (ref 3.5–5.2)
Sodium: 142 mmol/L (ref 134–144)

## 2015-05-17 LAB — LIPID PANEL
CHOLESTEROL TOTAL: 208 mg/dL — AB (ref 100–199)
Chol/HDL Ratio: 2.6 ratio units (ref 0.0–4.4)
HDL: 81 mg/dL (ref >39)
LDL Calculated: 112 mg/dL — ABNORMAL HIGH (ref 0–99)
Triglycerides: 77 mg/dL (ref 0–149)
VLDL CHOLESTEROL CAL: 15 mg/dL (ref 5–40)

## 2015-05-21 ENCOUNTER — Other Ambulatory Visit: Payer: Self-pay

## 2015-05-21 MED ORDER — PRAVASTATIN SODIUM 10 MG PO TABS: 10.0000 mg | ORAL_TABLET | Freq: Every day | ORAL | Status: DC

## 2015-05-22 ENCOUNTER — Ambulatory Visit (INDEPENDENT_AMBULATORY_CARE_PROVIDER_SITE_OTHER): Payer: Medicare Other | Admitting: Internal Medicine

## 2015-05-22 ENCOUNTER — Encounter: Payer: Self-pay | Admitting: Internal Medicine

## 2015-05-22 ENCOUNTER — Telehealth: Payer: Self-pay | Admitting: Internal Medicine

## 2015-05-22 VITALS — BP 148/88 | HR 72 | Ht 63.0 in | Wt 205.4 lb

## 2015-05-22 DIAGNOSIS — K824 Cholesterolosis of gallbladder: Secondary | ICD-10-CM | POA: Diagnosis not present

## 2015-05-22 DIAGNOSIS — R0789 Other chest pain: Secondary | ICD-10-CM

## 2015-05-22 DIAGNOSIS — Z86010 Personal history of colonic polyps: Secondary | ICD-10-CM | POA: Diagnosis not present

## 2015-05-22 DIAGNOSIS — R1013 Epigastric pain: Secondary | ICD-10-CM

## 2015-05-22 MED ORDER — HYOSCYAMINE SULFATE 0.125 MG SL SUBL: 0.1250 mg | SUBLINGUAL_TABLET | SUBLINGUAL | Status: DC | PRN

## 2015-05-22 MED ORDER — NA SULFATE-K SULFATE-MG SULF 17.5-3.13-1.6 GM/177ML PO SOLN: 1.0000 | Freq: Once | ORAL | Status: DC

## 2015-05-22 NOTE — Patient Instructions (Signed)
You have been scheduled for an endoscopy and colonoscopy. Please follow the written instructions given to you at your visit today. Please pick up your prep supplies at the pharmacy within the next 1-3 days. If you use inhalers (even only as needed), please bring them with you on the day of your procedure. Your physician has requested that you go to www.startemmi.com and enter the access code given to you at your visit today. This web site gives a general overview about your procedure. However, you should still follow specific instructions given to you by our office regarding your preparation for the procedure.  We will send in your prescription of Levsin to your pharmacy

## 2015-05-22 NOTE — Progress Notes (Signed)
Patient ID: Michele Page, female   DOB: July 07, 1956, 59 y.o.   MRN: [id number] HPI: Michele Page is a 59 year old female with past medical history stroke in 2013, hypertension, hyperlipidemia, MS who is seen in consultation at the request of Dr. Eulas Post to evaluate epigastric abdominal pain. She is here alone today. She reports she's been experiencing this pain for about 10 months. It is epigastric and in her center chest. It feels different than heartburn. She has a difficult time describing the discomfort. It comes and goes and can last 5-30 minutes at a time. She describes it is not sharp or burning. It doesn't relate to eating or worsen with eating. Not associated with nausea or vomiting. No dysphagia or odynophagia. Reports good appetite. Is not associated with dyspnea or exertion. She describes it as annoying. She has a history of heartburn and has taken multiple over-the-counter heartburn medications as well as prescription Nexium. She was on Nexium mental 3 weeks ago and this did not help the pain. It did resolve and frequent heartburn. She reports regular bowel movements without blood in her stool or melena. She denies itching, jaundice, edema. Denies family history of GI tract malignancy or IBD.  She reports a past history of colon polyps, 3 polyps removed 6 or 7 years ago in [place]. She feels she is likely overdue for surveillance. She also had a remote endoscopy which she recalls being normal.  Review of records revealed abdominal ultrasound performed in November 15 to evaluate upper abdominal pain. This showed fatty liver and a 7 mm gallbladder polyp.  Recent lab work has been unremarkable with normal liver enzymes and blood counts.  Past Medical History  Diagnosis Date  . Stroke     2013  . Hypertension   . COPD (chronic obstructive pulmonary disease)   . Multiple sclerosis   . Vision abnormalities   . Colon polyps     No past surgical history on file.  Outpatient Prescriptions Prior  to Visit  Medication Sig Dispense Refill  . aspirin EC 81 MG tablet Take 1 tablet (81 mg total) by mouth daily. 30 tablet 6  . labetalol (NORMODYNE) 300 MG tablet Take 2 tablets (600 mg total) by mouth 3 (three) times daily. 180 tablet 4  . niCARdipine (CARDENE) 20 MG capsule Take 2 capsules (40 mg total) by mouth every 8 (eight) hours. 180 capsule 4  . pravastatin (PRAVACHOL) 10 MG tablet Take 1 tablet (10 mg total) by mouth at bedtime. Take 1 tablet by mouth at bedtime 30 tablet 3  . esomeprazole (NEXIUM) 40 MG capsule Take 1 capsule (40 mg total) by mouth daily. (Patient not taking: Reported on 05/03/2015) 30 capsule 3  . losartan (COZAAR) 100 MG tablet Take 100 mg by mouth daily.    . traMADol (ULTRAM) 50 MG tablet Take 1 tablet (50 mg total) by mouth every 6 (six) hours as needed for moderate pain or severe pain. 30 tablet 1   No facility-administered medications prior to visit.    No Known Allergies  Family History  Problem Relation Age of Onset  . Stroke Sister   . Alcohol abuse Brother   . HIV/AIDS Brother   . Hypertension Mother   . Stroke Mother     History  Substance Use Topics  . Smoking status: Former Smoker -- 0.50 packs/day for 25 years    Types: Cigarettes    Quit date: 05/21/2002  . Smokeless tobacco: Never Used  . Alcohol Use: 0.0 oz/week  0 Standard drinks or equivalent per week     Comment: occas    ROS: As per history of present illness, otherwise negative  BP 148/88 mmHg  Pulse 72  Ht 5\' 3"  (1.6 m)  Wt 205 lb 6 oz (93.157 kg)  BMI 36.39 kg/m2 Constitutional: Well-developed and well-nourished. No distress. HEENT: Normocephalic and atraumatic. Oropharynx is clear and moist. No oropharyngeal exudate. Conjunctivae are normal.  No scleral icterus. Neck: Neck supple. Trachea midline. Former tracheotomy, scar well-healed Cardiovascular: Normal rate, regular rhythm and intact distal pulses. No M/R/G. no chest wall pain with pressure over the sternum and  rib cage anteriorly Pulmonary/chest: Effort normal and breath sounds normal. No wheezing, rales or rhonchi. Abdominal: Soft, mild epigastric tenderness without rebound or guarding, nondistended. Bowel sounds active throughout. There are no masses palpable. No hepatosplenomegaly. Extremities: no clubbing, cyanosis, or edema Lymphadenopathy: No cervical adenopathy noted. Neurological: Alert and oriented to person place and time. Skin: Skin is warm and dry. No rashes noted. Psychiatric: Normal mood and affect. Behavior is normal.  RELEVANT LABS AND IMAGING: CBC    Component Value Date/Time   WBC 4.9 01/23/2015 1014   WBC 5.8 08/19/2014 1325   RBC 4.20 01/23/2015 1014   RBC 4.19 08/19/2014 1325   HGB 13.2 01/23/2015 1014   HCT 38.4 01/23/2015 1014   PLT 320 01/23/2015 1014   MCV 91 01/23/2015 1014   MCH 31.4 01/23/2015 1014   MCH 31.0 08/19/2014 1325   MCHC 34.4 01/23/2015 1014   MCHC 34.3 08/19/2014 1325   RDW 13.6 01/23/2015 1014   RDW 13.2 08/19/2014 1325   LYMPHSABS 1.6 01/23/2015 1014   EOSABS 0.1 01/23/2015 1014   BASOSABS 0.0 01/23/2015 1014    CMP     Component Value Date/Time   NA 142 05/16/2015 0840   NA 141 11/05/2014 1254   K 4.1 05/16/2015 0840   CL 101 05/16/2015 0840   CO2 25 05/16/2015 0840   GLUCOSE 94 05/16/2015 0840   GLUCOSE 117* 11/05/2014 1254   BUN 24 05/16/2015 0840   BUN 16 11/05/2014 1254   CREATININE 1.66* 05/16/2015 0840   CALCIUM 10.2 05/16/2015 0840   PROT 7.0 01/23/2015 1014   PROT 7.3 08/19/2014 1325   ALBUMIN 4.1 08/19/2014 1325   AST 27 01/23/2015 1014   ALT 23 01/23/2015 1014   ALKPHOS 79 01/23/2015 1014   BILITOT 0.5 01/23/2015 1014   BILITOT 0.4 08/19/2014 1325   GFRNONAA 33* 05/16/2015 0840   GFRAA 40* 05/16/2015 0840   abd [place] Nov 2015 -- reviewed  ASSESSMENT/PLAN: 59 year old female with past medical history stroke in 2013, hypertension, hyperlipidemia, MS who is seen in consultation at the request of Dr. Eulas Post to  evaluate epigastric abdominal pain.  1. Epigastric pain/atypical CP -- no response to PPI. Unsure if this is spastic type pain or related to gastroesophageal pathology. Possible esophageal spasm? Upper endoscopy recommended for direct visualization and to exclude esophagitis and gastritis. If unremarkable may need esophageal manometry. Trial of Levsin 0.25 one to 2 tablets sublingual every 4-6 hours as needed to see if this improves pain.  2. History of colon polyps -- history of colonic polyps 6+ years ago. Patient feels overdue for surveillance. Attempt to obtain records. Repeat surveillance colonoscopy recommended. We discussed the risks, benefits and alternatives to upper and lower endoscopy and she agrees to proceed.  3. Hx of CVA -- overall impressive recovery with return of function. Little to no residual defects. On management for hypertension and hyperlipidemia  4. Gallbladder polyp -- subcentimeter repeat imaging with ultrasound recommended November 2016 to assess stability   CC:[id number] Corinna Lines [address] [place], [place] [address]

## 2015-05-22 NOTE — Telephone Encounter (Signed)
Returned pharmacies call. Spoke with pharmacist he wanted to clarify the 1-2 directions

## 2015-05-22 NOTE — Addendum Note (Signed)
Addended by: Oda Kilts on: 05/22/2015 11:40 AM   Modules accepted: Orders

## 2015-05-29 ENCOUNTER — Other Ambulatory Visit: Payer: Self-pay | Admitting: Internal Medicine

## 2015-05-29 DIAGNOSIS — I10 Essential (primary) hypertension: Secondary | ICD-10-CM

## 2015-05-30 NOTE — Telephone Encounter (Signed)
Left message on voicemail for patient to return call when available , reason for call- requested refill is not on medication list

## 2015-05-31 NOTE — Telephone Encounter (Signed)
Spoke with patient, patient is no longer taking Tramadol, rx sent back as denial with indication patient no longer taking. Patient states she need refill on b/p medication that's 10 mg in a punch out pack. Patient is unsure of the name of medication. Patient aware I will contact the pharmacy to see if they can tell me.   I called the pharmacy x 2, both times no answer. I left message on voicemail for pharmacy to follow-up with our office.   Patient was informed I left message on voicemail.

## 2015-07-08 ENCOUNTER — Encounter: Payer: Self-pay | Admitting: Internal Medicine

## 2015-07-08 ENCOUNTER — Ambulatory Visit (AMBULATORY_SURGERY_CENTER): Payer: Medicare Other | Admitting: Internal Medicine

## 2015-07-08 VITALS — BP 149/81 | HR 74 | Temp 97.5°F | Resp 20 | Ht 63.0 in | Wt 205.0 lb

## 2015-07-08 DIAGNOSIS — D125 Benign neoplasm of sigmoid colon: Secondary | ICD-10-CM

## 2015-07-08 DIAGNOSIS — J449 Chronic obstructive pulmonary disease, unspecified: Secondary | ICD-10-CM | POA: Diagnosis not present

## 2015-07-08 DIAGNOSIS — Z860100 Personal history of colon polyps, unspecified: Secondary | ICD-10-CM

## 2015-07-08 DIAGNOSIS — R1013 Epigastric pain: Secondary | ICD-10-CM | POA: Diagnosis not present

## 2015-07-08 DIAGNOSIS — D127 Benign neoplasm of rectosigmoid junction: Secondary | ICD-10-CM

## 2015-07-08 DIAGNOSIS — R0789 Other chest pain: Secondary | ICD-10-CM | POA: Diagnosis not present

## 2015-07-08 DIAGNOSIS — I10 Essential (primary) hypertension: Secondary | ICD-10-CM | POA: Diagnosis not present

## 2015-07-08 DIAGNOSIS — D120 Benign neoplasm of cecum: Secondary | ICD-10-CM | POA: Diagnosis not present

## 2015-07-08 DIAGNOSIS — K3189 Other diseases of stomach and duodenum: Secondary | ICD-10-CM | POA: Diagnosis not present

## 2015-07-08 MED ORDER — SODIUM CHLORIDE 0.9 % IV SOLN: 500.0000 mL | INTRAVENOUS | Status: DC

## 2015-07-08 NOTE — Op Note (Signed)
[place]  [address]. [place], [address]   COLONOSCOPY PROCEDURE REPORT  PATIENT: Michele Page, Michele Page  MR#: [id number] BIRTHDATE: 08-17-1956 , 38  yrs. old GENDER: female ENDOSCOPIST: Jerene Bears, MD REFERRED BY: Gildardo Cranker, DO PROCEDURE DATE:  07/08/2015 PROCEDURE:   Colonoscopy, surveillance , Colonoscopy with snare polypectomy, and Colonoscopy with cold biopsy polypectomy First Screening Colonoscopy - Avg.  risk and is 50 yrs.  old or older - No.  Prior Negative Screening - Now for repeat screening. N/A  History of Adenoma - Now for follow-up colonoscopy & has been > or = to 3 yrs.  N/A  Polyps removed today? Yes ASA CLASS:   Class II INDICATIONS:Surveillance due to prior colonic neoplasia and PH Colon Adenoma. MEDICATIONS: Monitored anesthesia care, this was the total dose used for all procedures at this session, and Propofol 550 mg IV  DESCRIPTION OF PROCEDURE:   After the risks benefits and alternatives of the procedure were thoroughly explained, informed consent was obtained.  The digital rectal exam revealed no rectal mass.   The LB PCF Q180 [id number]  endoscope was introduced through the anus and advanced to the cecum, which was identified by both the appendix and ileocecal valve. No adverse events experienced. The quality of the prep was good.  (Suprep was used)  The instrument was then slowly withdrawn as the colon was fully examined. Estimated blood loss is zero unless otherwise noted in this procedure report.  COLON FINDINGS: A sessile polyp measuring 8 mm in size was found at the cecum.  A polypectomy was performed with a cold snare.  The resection was complete, the polyp tissue was completely retrieved and sent to histology.   Two sessile polyps ranging between 3-60mm in size were found in the sigmoid colon and rectosigmoid colon. Polypectomies were performed with a cold snare (1) and with cold forceps (1).  The resection was complete, the polyp  tissue was completely retrieved and sent to histology.   There was moderate diverticulosis noted in the descending colon and sigmoid colon with associated petechiae and muscular hypertrophy.  Retroflexed views revealed internal hemorrhoids. The time to cecum = 4.4 Withdrawal time = 21.3   The scope was withdrawn and the procedure completed. COMPLICATIONS: There were no immediate complications.  ENDOSCOPIC IMPRESSION: 1.   Sessile polyp was found at the cecum; polypectomy was performed with a cold snare 2.   Two sessile polyps ranging between 3-64mm in size were found in the sigmoid colon and rectosigmoid colon; polypectomies were performed with a cold snare and with cold forceps 3.   There was moderate diverticulosis noted in the descending colon and sigmoid colon  RECOMMENDATIONS: 1.  Await pathology results 2.  Avoid all NSAIDs for the next 2 weeks. 3.  High fiber diet 4.  Timing of repeat colonoscopy will be determined by pathology findings. 5.  You will receive a letter within 1-2 weeks with the results of your biopsy as well as final recommendations.  Please call my office if you have not received a letter after 3 weeks.  eSigned:  Jerene Bears, MD 07/08/2015 3:33 PM   cc:  the patient, PCP   PATIENT NAME:  [name], [name] MR#: [id number]

## 2015-07-08 NOTE — Patient Instructions (Addendum)

## 2015-07-08 NOTE — Progress Notes (Signed)
Transferred to recovery room. A/O x3, pleased with MAC.  VSS.  Report to Penny, RN. 

## 2015-07-08 NOTE — Op Note (Signed)
[place]  [address]. [place], [address]   ENDOSCOPY PROCEDURE REPORT  PATIENT: Michele Page, Michele Page  MR#: [id number] BIRTHDATE: October 04, 1956 , 66  yrs. old GENDER: female ENDOSCOPIST: Jerene Bears, MD REFERRED BY:   Gildardo Cranker, DO PROCEDURE DATE:  07/08/2015 PROCEDURE:  EGD, diagnostic and EGD w/ biopsy ASA CLASS:     Class II INDICATIONS:  epigastric pain and atypical chest pain. MEDICATIONS: Monitored anesthesia care and Propofol 200 mg IV TOPICAL ANESTHETIC: none  DESCRIPTION OF PROCEDURE: After the risks benefits and alternatives of the procedure were thoroughly explained, informed consent was obtained.  The LB [id number] endoscope was introduced through the mouth and advanced to the second portion of the duodenum , Without limitations.  The instrument was slowly withdrawn as the mucosa was fully examined.  ESOPHAGUS: The mucosa of the esophagus appeared normal.   Z line regular at 38 cm  STOMACH: Moderate striped acute gastritis (inflammation) was found in the gastric antrum.  Cold forcep biopsies were taken at the gastric body, antrum and angularis to evaluate for h.  pylori.  DUODENUM: The duodenal mucosa showed no abnormalities in the bulb and 2nd part of the duodenum. Retroflexed views revealed no abnormalities.     The scope was then withdrawn from the patient and the procedure completed.  COMPLICATIONS: There were no immediate complications.  ENDOSCOPIC IMPRESSION: 1.   The mucosa of the esophagus appeared normal 2.   Acute gastritis (inflammation) was found in the gastric antrum; multiple biopsies 3.   The duodenal mucosa showed no abnormalities in the bulb and 2nd part of the duodenum  RECOMMENDATIONS: 1.  Await biopsy results 2.  Proceed with a Colonoscopy. 3.  No source for atypical chest pain.  If symptoms continue would recommend high-resolution esophageal manometry to exclude esophageal dysmotility/spasm  eSigned:  Jerene Bears, MD 07/08/2015 3:25 PM   CC: the patient, PCP

## 2015-07-08 NOTE — Progress Notes (Signed)
Called to room to assist during endoscopic procedure.  Patient ID and intended procedure confirmed with present staff. Received instructions for my participation in the procedure from the performing physician.  

## 2015-07-09 ENCOUNTER — Telehealth: Payer: Self-pay | Admitting: Emergency Medicine

## 2015-07-09 NOTE — Telephone Encounter (Signed)
  Follow up Call-  Call back number 07/08/2015  Post procedure Call Back phone  # [phone] home   Permission to leave phone message Yes     Patient questions:  Do you have a fever, pain , or abdominal swelling? No. Pain Score  0 *  Have you tolerated food without any problems? Yes.    Have you been able to return to your normal activities? Yes.    Do you have any questions about your discharge instructions: Diet   No. Medications  No. Follow up visit  No.  Do you have questions or concerns about your Care? No.  Actions: * If pain score is 4 or above: No action needed, pain <4.

## 2015-07-17 ENCOUNTER — Encounter: Payer: Self-pay | Admitting: Internal Medicine

## 2015-07-23 ENCOUNTER — Ambulatory Visit: Payer: Medicare Other | Admitting: Neurology

## 2015-08-07 ENCOUNTER — Ambulatory Visit (INDEPENDENT_AMBULATORY_CARE_PROVIDER_SITE_OTHER): Payer: Medicare Other | Admitting: Internal Medicine

## 2015-08-07 ENCOUNTER — Encounter: Payer: Self-pay | Admitting: Internal Medicine

## 2015-08-07 VITALS — BP 130/96 | HR 71 | Temp 98.1°F | Resp 18 | Ht 63.0 in | Wt 202.8 lb

## 2015-08-07 DIAGNOSIS — I639 Cerebral infarction, unspecified: Secondary | ICD-10-CM | POA: Diagnosis not present

## 2015-08-07 DIAGNOSIS — K2970 Gastritis, unspecified, without bleeding: Secondary | ICD-10-CM

## 2015-08-07 DIAGNOSIS — I10 Essential (primary) hypertension: Secondary | ICD-10-CM

## 2015-08-07 DIAGNOSIS — J301 Allergic rhinitis due to pollen: Secondary | ICD-10-CM

## 2015-08-07 DIAGNOSIS — Z23 Encounter for immunization: Secondary | ICD-10-CM

## 2015-08-07 DIAGNOSIS — I6930 Unspecified sequelae of cerebral infarction: Secondary | ICD-10-CM

## 2015-08-07 MED ORDER — LABETALOL HCL 300 MG PO TABS: 600.0000 mg | ORAL_TABLET | Freq: Three times a day (TID) | ORAL | Status: DC

## 2015-08-07 MED ORDER — NICARDIPINE HCL 20 MG PO CAPS: 40.0000 mg | ORAL_CAPSULE | Freq: Three times a day (TID) | ORAL | Status: DC

## 2015-08-07 NOTE — Progress Notes (Signed)
Patient ID: Michele Page, female   DOB: 19-Jul-1956, 59 y.o.   MRN: [id number]    Location:    [facility]   Place of Service:  OFFICE   Chief Complaint  Patient presents with  . Medical Management of Chronic Issues    3 month follow-up for Hypertension,MS  . Immunizations    Will take flu shot today  . OTHER    Patient says she is having trouble with a runny nose    HPI:  59 yo female seen today for f/u  HTN - slowly improving on nicardipine and labetalol but readings still spike between 4pm and 8 pm. She does nto take meds at same time every day  COPD - no exacerbations. No issues with breathing  Hx CVA - no new sx's. Takes statin and ASA daily  MS - no obvious sx's - no diplopia but she cannot "stare" for prolonged time without blinking.  Acute gastritis seen on EGD last month. She is taking nexium daily which is helping. Abdominal pain is improving. She had a colonoscopy and tubular adenoma polyps removed   Past Medical History  Diagnosis Date  . Stroke     2013  . Hypertension   . COPD (chronic obstructive pulmonary disease)   . Multiple sclerosis   . Vision abnormalities   . Colon polyps     History reviewed. No pertinent past surgical history.  Patient Care Team: Gildardo Cranker, DO as PCP - General (Internal Medicine)  Social History   Social History  . Marital Status: Married    Spouse Name: N/A  . Number of Children: 3  . Years of Education: N/A   Occupational History  . retired    Social History Main Topics  . Smoking status: Former Smoker -- 0.50 packs/day for 25 years    Types: Cigarettes    Quit date: 05/21/2002  . Smokeless tobacco: Never Used  . Alcohol Use: 0.0 oz/week    0 Standard drinks or equivalent per week     Comment: occas  . Drug Use: No  . Sexual Activity: No   Other Topics Concern  . Not on file   Social History Narrative   Diet- N/A   Caffeine- Yes   Married- Yes   House- 2 story with 2 people   Pets- No   Current/past  profession- Insurance underwriter daycare   Exercise- Yes   Living will-No   DNR-N/A   POA/HPOA-No           reports that she quit smoking about 13 years ago. Her smoking use included Cigarettes. She has a 12.5 pack-year smoking history. She has never used smokeless tobacco. She reports that she drinks alcohol. She reports that she does not use illicit drugs.  No Known Allergies  Medications: Patient's Medications  New Prescriptions   No medications on file  Previous Medications   ASPIRIN EC 81 MG TABLET    Take 1 tablet (81 mg total) by mouth daily.   HYOSCYAMINE (LEVSIN SL) 0.125 MG SL TABLET    Place 1 tablet (0.125 mg total) under the tongue every 4 (four) hours as needed (1-2 every 4-6 hours as needed for abdominal cramping).   LABETALOL (NORMODYNE) 300 MG TABLET    Take 2 tablets (600 mg total) by mouth 3 (three) times daily.   NICARDIPINE (CARDENE) 20 MG CAPSULE    Take 2 capsules (40 mg total) by mouth every 8 (eight) hours.   OXYCODONE-ACETAMINOPHEN (PERCOCET/ROXICET) 5-325 MG PER TABLET  Take 1 tablet by mouth every 4 (four) hours as needed. Patient take as needed   PRAVASTATIN (PRAVACHOL) 10 MG TABLET    Take 1 tablet (10 mg total) by mouth at bedtime. Take 1 tablet by mouth at bedtime  Modified Medications   No medications on file  Discontinued Medications   No medications on file    Review of Systems  Unable to perform ROS: Other  aphasia  Filed Vitals:   08/07/15 1029  BP: 130/96  Pulse: 71  Temp: 98.1 F (36.7 C)  TempSrc: Oral  Resp: 18  Height: 5\' 3"  (1.6 m)  Weight: 202 lb 12.8 oz (91.989 kg)  SpO2: 95%   Body mass index is 35.93 kg/(m^2).  Physical Exam  Constitutional: She appears well-developed and well-nourished.  HENT:  Mouth/Throat: Oropharynx is clear and moist. No oropharyngeal exudate.  Eyes: Pupils are equal, round, and reactive to light. No scleral icterus.  Neck: Neck supple. Carotid bruit is not present. No tracheal deviation present. No  thyromegaly present.  Cardiovascular: Normal rate, regular rhythm, normal heart sounds and intact distal pulses.  Exam reveals no gallop and no friction rub.   No murmur heard. No LE edema b/l. no calf TTP.   Pulmonary/Chest: Effort normal and breath sounds normal. No stridor. No respiratory distress. She has no wheezes. She has no rales.  Abdominal: Soft. Bowel sounds are normal. She exhibits no distension and no mass. There is no hepatomegaly. There is tenderness (epigastric). There is no rebound and no guarding.  Musculoskeletal: She exhibits edema and tenderness.  Lymphadenopathy:    She has no cervical adenopathy.  Neurological: She is alert.  Skin: Skin is warm and dry. No rash noted.  Psychiatric: She has a normal mood and affect. Her behavior is normal.     Labs reviewed: Appointment on 05/16/2015  Component Date Value Ref Range Status  . Glucose 05/16/2015 94  65 - 99 mg/dL Final  . BUN 05/16/2015 24  6 - 24 mg/dL Final  . Creatinine, Ser 05/16/2015 1.66* 0.57 - 1.00 mg/dL Final  . GFR calc non Af Amer 05/16/2015 33* >59 mL/min/1.73 Final  . GFR calc Af Amer 05/16/2015 39* >59 mL/min/1.73 Final  . BUN/Creatinine Ratio 05/16/2015 14  9 - 23 Final  . Sodium 05/16/2015 142  134 - 144 mmol/L Final  . Potassium 05/16/2015 4.1  3.5 - 5.2 mmol/L Final  . Chloride 05/16/2015 101  97 - 108 mmol/L Final  . CO2 05/16/2015 25  18 - 29 mmol/L Final  . Calcium 05/16/2015 10.2  8.7 - 10.2 mg/dL Final  . Cholesterol, Total 05/16/2015 208* 100 - 199 mg/dL Final  . Triglycerides 05/16/2015 77  0 - 149 mg/dL Final  . HDL 05/16/2015 81  >39 mg/dL Final   Comment: According to ATP-III Guidelines, HDL-C >59 mg/dL is considered a negative risk factor for CHD.   Marland Kitchen VLDL Cholesterol Cal 05/16/2015 15  5 - 40 mg/dL Final  . LDL Calculated 05/16/2015 112* 0 - 99 mg/dL Final  . Chol/HDL Ratio 05/16/2015 2.6  0.0 - 4.4 ratio units Final   Comment:                                   T. Chol/HDL  Ratio  Men  Women                               1/2 Avg.Risk  3.4    3.3                                   Avg.Risk  5.0    4.4                                2X Avg.Risk  9.6    7.1                                3X Avg.Risk 23.4   11.0     No results found.   Assessment/Plan   ICD-9-CM ICD-10-CM   1. Essential hypertension - fluctuating readings at home 401.9 I10 niCARdipine (CARDENE) 20 MG capsule     labetalol (NORMODYNE) 300 MG tablet     Basic Metabolic Panel  2. Need for prophylactic vaccination and inoculation against influenza V04.81 Z23 Flu Vaccine QUAD 36+ mos PF IM (Fluarix & Fluzone Quad PF)  3. History of stroke with residual deficit - stable 438.9 R74.08 Basic Metabolic Panel     ALT     Lipid Panel  4. Allergic rhinitis due to pollen - uncontrolled 477.0 J30.1   5. Gastritis - acute with continued abdominal pain 535.50 K29.70   6. Tubular adenomatous colonic polyp  Take both blood pressure medications at the same time each day  Take plain claritin, allegra or zyrtec daily for seasonal allergy. Use saline nasal spray as needed to keep nose moist  Continue other medications as ordered  Follow up with GI for abdominal pain  Will call with lab results  Follow up in 4 mos for routine visit  Flu shot given today  Monica S. Perlie Gold  [facility] and Adult Medicine [address] [place], [place] [address] [phone] Cell (Monday-Friday 8 AM - 5 PM) [phone] After 5 PM and follow prompts

## 2015-08-07 NOTE — Patient Instructions (Addendum)
Take both blood pressure medications at the same time each day  Take plain claritin, allegra or zyrtec daily for seasonal allergy. Use saline nasal spray as needed to keep nose moist  Continue other medications as ordered  Follow up with GI for abdominal pain  Will call with lab results  Follow up in 4 mos for routine visit  Flu shot given today

## 2015-08-08 LAB — BASIC METABOLIC PANEL
BUN / CREAT RATIO: 10 — AB (ref 11–26)
BUN: 16 mg/dL (ref 8–27)
CO2: 25 mmol/L (ref 18–29)
Calcium: 9.5 mg/dL (ref 8.7–10.3)
Chloride: 97 mmol/L (ref 97–108)
Creatinine, Ser: 1.64 mg/dL — ABNORMAL HIGH (ref 0.57–1.00)
GFR, EST AFRICAN AMERICAN: 39 mL/min/{1.73_m2} — AB (ref >59)
GFR, EST NON AFRICAN AMERICAN: 34 mL/min/{1.73_m2} — AB (ref >59)
Glucose: 104 mg/dL — ABNORMAL HIGH (ref 65–99)
Potassium: 3.7 mmol/L (ref 3.5–5.2)
Sodium: 140 mmol/L (ref 134–144)

## 2015-08-08 LAB — LIPID PANEL
Chol/HDL Ratio: 2.9 ratio units (ref 0.0–4.4)
Cholesterol, Total: 189 mg/dL (ref 100–199)
HDL: 66 mg/dL (ref >39)
LDL CALC: 99 mg/dL (ref 0–99)
TRIGLYCERIDES: 118 mg/dL (ref 0–149)
VLDL CHOLESTEROL CAL: 24 mg/dL (ref 5–40)

## 2015-08-08 LAB — ALT: ALT: 17 IU/L (ref 0–32)

## 2015-08-14 ENCOUNTER — Ambulatory Visit: Payer: Medicare Other | Admitting: Neurology

## 2015-10-02 ENCOUNTER — Other Ambulatory Visit: Payer: Self-pay | Admitting: Internal Medicine

## 2015-10-22 ENCOUNTER — Encounter (HOSPITAL_COMMUNITY): Payer: Self-pay

## 2015-10-22 ENCOUNTER — Emergency Department (HOSPITAL_COMMUNITY)
Admission: EM | Admit: 2015-10-22 | Discharge: 2015-10-22 | Disposition: A | Discharge status: Home / Self Care | Payer: Medicare Other | Attending: Emergency Medicine | Admitting: Emergency Medicine

## 2015-10-22 DIAGNOSIS — Z87891 Personal history of nicotine dependence: Secondary | ICD-10-CM | POA: Insufficient documentation

## 2015-10-22 DIAGNOSIS — N938 Other specified abnormal uterine and vaginal bleeding: Secondary | ICD-10-CM | POA: Insufficient documentation

## 2015-10-22 DIAGNOSIS — Z86010 Personal history of colonic polyps: Secondary | ICD-10-CM | POA: Diagnosis not present

## 2015-10-22 DIAGNOSIS — I10 Essential (primary) hypertension: Secondary | ICD-10-CM | POA: Insufficient documentation

## 2015-10-22 DIAGNOSIS — Z79899 Other long term (current) drug therapy: Secondary | ICD-10-CM | POA: Diagnosis not present

## 2015-10-22 DIAGNOSIS — Z8669 Personal history of other diseases of the nervous system and sense organs: Secondary | ICD-10-CM | POA: Diagnosis not present

## 2015-10-22 DIAGNOSIS — Z8673 Personal history of transient ischemic attack (TIA), and cerebral infarction without residual deficits: Secondary | ICD-10-CM | POA: Insufficient documentation

## 2015-10-22 DIAGNOSIS — Z7982 Long term (current) use of aspirin: Secondary | ICD-10-CM | POA: Insufficient documentation

## 2015-10-22 DIAGNOSIS — N939 Abnormal uterine and vaginal bleeding, unspecified: Secondary | ICD-10-CM

## 2015-10-22 DIAGNOSIS — J449 Chronic obstructive pulmonary disease, unspecified: Secondary | ICD-10-CM | POA: Diagnosis not present

## 2015-10-22 LAB — I-STAT CHEM 8, ED
BUN: 15 mg/dL (ref 6–20)
CREATININE: 1.4 mg/dL — AB (ref 0.44–1.00)
Calcium, Ion: 1.22 mmol/L (ref 1.13–1.30)
Chloride: 102 mmol/L (ref 101–111)
Glucose, Bld: 96 mg/dL (ref 65–99)
HEMATOCRIT: 38 % (ref 36.0–46.0)
Hemoglobin: 12.9 g/dL (ref 12.0–15.0)
Potassium: 4.1 mmol/L (ref 3.5–5.1)
Sodium: 141 mmol/L (ref 135–145)
TCO2: 27 mmol/L (ref 0–100)

## 2015-10-22 NOTE — Discharge Instructions (Signed)
Your exam today was not concerning. Your lab work was reassuring. It is important for you to follow-up with your PCP for further evaluation and management of your symptoms. You will likely need a formal evaluation by GYN. Return to ED for any new or worsening symptoms.  Abnormal Uterine Bleeding Abnormal uterine bleeding means bleeding from the vagina that is not your normal menstrual period. This can be:  Bleeding or spotting between periods.  Bleeding after sex (sexual intercourse).  Bleeding that is heavier or more than normal.  Periods that last longer than usual.  Bleeding after menopause. There are many problems that may cause this. Treatment will depend on the cause of the bleeding. Any kind of bleeding that is not normal should be reviewed by your doctor.  HOME CARE Watch your condition for any changes. These actions may lessen any discomfort you are having:  Do not use tampons or douches as told by your doctor.  Change your pads often. You should get regular pelvic exams and Pap tests. Keep all appointments for tests as told by your doctor. GET HELP IF:  You are bleeding for more than 1 week.  You feel dizzy at times. GET HELP RIGHT AWAY IF:   You pass out.  You have to change pads every 15 to 30 minutes.  You have belly pain.  You have a fever.  You become sweaty or weak.  You are passing large blood clots from the vagina.  You feel sick to your stomach (nauseous) and throw up (vomit). MAKE SURE YOU:  Understand these instructions.  Will watch your condition.  Will get help right away if you are not doing well or get worse.   This information is not intended to replace advice given to you by your health care provider. Make sure you discuss any questions you have with your health care provider.   Document Released: 08/30/2009 Document Revised: 11/07/2013 Document Reviewed: 06/01/2013 Elsevier Interactive Patient Education [employer].

## 2015-10-22 NOTE — ED Notes (Signed)
Pt with vaginal bleeding x 3 months.  More in last 2 days.  Very minimal.  Dark red.  More with wiping.  No fever.  No odor.

## 2015-10-22 NOTE — ED Provider Notes (Signed)
CSN: [id number]     Arrival date & time 10/22/15  0908 History   First MD Initiated Contact with Patient 10/22/15 1002     Chief Complaint  Patient presents with  . Vaginal Bleeding     (Consider location/radiation/quality/duration/timing/severity/associated sxs/prior Treatment) HPI Michele Page is a 59 y.o. female with history of hypertension, COPD, MS, comes in for evaluation of vaginal bleeding. Patient reports over the past 6 months she has had intermittent, very mild vaginal spotting. She reports she will have spotting every 2-3 days, then it will resolve spontaneously. She reports slightly increased bleeding over the past 2 days, usually only after she wipes. She is not having to use pads. She denies any other symptoms, no fevers, chills, abdominal pain, vaginal discharge, nausea or vomiting, urinary symptoms, diarrhea or constipation. She has not talked to her PCP for this problem. She reports she is sexually active, last intercourse 2 weeks ago and no bleeding afterwards. Nothing makes the problem better or worse. No other modifying factors.  Past Medical History  Diagnosis Date  . Stroke [facility])     2013  . Hypertension   . COPD (chronic obstructive pulmonary disease) ([place])   . Multiple sclerosis ([place])   . Vision abnormalities   . Colon polyps    History reviewed. No pertinent past surgical history. Family History  Problem Relation Age of Onset  . Stroke Sister   . Alcohol abuse Brother   . HIV/AIDS Brother   . Hypertension Mother   . Stroke Mother    Social History  Substance Use Topics  . Smoking status: Former Smoker -- 0.50 packs/day for 25 years    Types: Cigarettes    Quit date: 05/21/2002  . Smokeless tobacco: Never Used  . Alcohol Use: 0.0 oz/week    0 Standard drinks or equivalent per week     Comment: occas   OB History    No data available     Review of Systems A 10 point review of systems was completed and was negative except for pertinent positives and  negatives as mentioned in the history of present illness     Allergies  Review of patient's allergies indicates no known allergies.  Home Medications   Prior to Admission medications   Medication Sig Start Date End Date Taking? Authorizing Provider  aspirin EC 81 MG tablet Take 1 tablet (81 mg total) by mouth daily. 03/01/15  Yes Gildardo Cranker, DO  labetalol (NORMODYNE) 300 MG tablet Take 2 tablets (600 mg total) by mouth 3 (three) times daily. 08/07/15  Yes Monica Carter, DO  niCARdipine (CARDENE) 20 MG capsule Take 2 capsules (40 mg total) by mouth every 8 (eight) hours. 08/07/15  Yes Gildardo Cranker, DO  pravastatin (PRAVACHOL) 10 MG tablet TAKE ONE TABLET BY MOUTH ONCE DAILY AT BEDTIME 10/02/15  Yes Gildardo Cranker, DO  hyoscyamine (LEVSIN SL) 0.125 MG SL tablet Place 1 tablet (0.125 mg total) under the tongue every 4 (four) hours as needed (1-2 every 4-6 hours as needed for abdominal cramping). Patient not taking: Reported on 10/22/2015 05/22/15   Jerene Bears, MD   BP 145/96 mmHg  Pulse 71  Temp(Src) 97.6 F (36.4 C) (Oral)  Resp 16  SpO2 100% Physical Exam  Constitutional: She is oriented to person, place, and time. She appears well-developed and well-nourished.  Well appearing, African-American female  HENT:  Head: Normocephalic and atraumatic.  Mouth/Throat: Oropharynx is clear and moist.  Eyes: Conjunctivae are normal. Pupils are equal, round, and  reactive to light. Right eye exhibits no discharge. Left eye exhibits no discharge. No scleral icterus.  Neck: Neck supple.  Cardiovascular: Normal rate, regular rhythm and normal heart sounds.   Pulmonary/Chest: Effort normal and breath sounds normal. No respiratory distress. She has no wheezes. She has no rales.  Abdominal: Soft. There is no tenderness.  Musculoskeletal: She exhibits no tenderness.  Neurological: She is alert and oriented to person, place, and time.  Cranial Nerves II-XII grossly intact  Skin: Skin is warm and dry.  No rash noted.  Psychiatric: She has a normal mood and affect.  Nursing note and vitals reviewed.   ED Course  Procedures (including critical care time) Labs Review Labs Reviewed  I-STAT CHEM 8, ED    Imaging Review No results found. I have personally reviewed and evaluated these images and lab results as part of my medical decision-making.   EKG Interpretation None     Meds given in ED:  Medications - No data to display  New Prescriptions   No medications on file   Filed Vitals:   10/22/15 0933  BP: 145/96  Pulse: 71  Temp: 97.6 F (36.4 C)  TempSrc: Oral  Resp: 16  SpO2: 100%    MDM  Michele Page is a 59 y.o. female with history of hypertension, COPD, MS comes in for evaluation of vaginal spotting. Patient without any other medical complaints at this time. No abdominal pain. On arrival, she is hemodynamically stable with normal vital signs and is afebrile. She declines pelvic exam. States she will follow-up with her PCP and GYN for formal evaluation. H&H within normal limits. Otherwise, patient appears very well, nontoxic and appropriate for outpatient follow-up with PCP. Patient voices no other questions or concerns at this time. Appropriate for discharge. Prior to discharge, I discussed and reviewed this case my attending, Dr. Tomi Bamberger. Final diagnoses:  Vaginal bleeding        Comer Locket, PA-C 10/22/15 1641  Dorie Rank, MD 10/23/15 1025

## 2015-10-23 ENCOUNTER — Encounter: Payer: Self-pay | Admitting: Internal Medicine

## 2015-10-23 ENCOUNTER — Ambulatory Visit (INDEPENDENT_AMBULATORY_CARE_PROVIDER_SITE_OTHER): Payer: Medicare Other | Admitting: Internal Medicine

## 2015-10-23 VITALS — BP 140/96 | HR 72 | Temp 98.0°F | Resp 20 | Ht 63.0 in | Wt 206.2 lb

## 2015-10-23 DIAGNOSIS — N950 Postmenopausal bleeding: Secondary | ICD-10-CM

## 2015-10-23 DIAGNOSIS — I639 Cerebral infarction, unspecified: Secondary | ICD-10-CM

## 2015-10-23 DIAGNOSIS — R1030 Lower abdominal pain, unspecified: Secondary | ICD-10-CM | POA: Diagnosis not present

## 2015-10-23 DIAGNOSIS — I10 Essential (primary) hypertension: Secondary | ICD-10-CM | POA: Diagnosis not present

## 2015-10-23 NOTE — Progress Notes (Signed)
Patient ID: Michele Page, female   DOB: 1956/01/05, 59 y.o.   MRN: [id number]    Location:    [facility]   Place of Service:  OFFICE   Chief Complaint  Patient presents with  . Acute Visit    Vaginal bleeding was seen in ER    HPI:  59 yo female seen today for vaginal bleeding. She reports 6 mo hx spotting but became heavier about 1 month ago. Each episode lasting several days then stopping for a few days only to resume again. (+) associated lower abdominal discomfort. She is sexually active with spouse. No vaginal d/c or lesions noted. She has been going to the gym for > 6 mos and has increased workout over the last several mos. She was seen in the ER yesterday for the bleeding. HGB/HCT 12.9/38 with Cr 1.4. She declined pelvic exam  Last menses prior to 6 mos ago was at age 77  HTN - stable on nicardipine and labetalol  Past Medical History  Diagnosis Date  . Stroke [facility])     2013  . Hypertension   . COPD (chronic obstructive pulmonary disease) ([place])   . Multiple sclerosis ([place])   . Vision abnormalities   . Colon polyps     History reviewed. No pertinent past surgical history.  Patient Care Team: Gildardo Cranker, DO as PCP - General (Internal Medicine)  Social History   Social History  . Marital Status: Married    Spouse Name: N/A  . Number of Children: 3  . Years of Education: N/A   Occupational History  . retired    Social History Main Topics  . Smoking status: Former Smoker -- 0.50 packs/day for 25 years    Types: Cigarettes    Quit date: 05/21/2002  . Smokeless tobacco: Never Used  . Alcohol Use: 0.0 oz/week    0 Standard drinks or equivalent per week     Comment: occas  . Drug Use: No  . Sexual Activity: No   Other Topics Concern  . Not on file   Social History Narrative   Diet- N/A   Caffeine- Yes   Married- Yes   House- 2 story with 2 people   Pets- No   Current/past profession- Insurance underwriter daycare   Exercise- Yes   Living will-No   DNR-N/A   POA/HPOA-No           reports that she quit smoking about 13 years ago. Her smoking use included Cigarettes. She has a 12.5 pack-year smoking history. She has never used smokeless tobacco. She reports that she drinks alcohol. She reports that she does not use illicit drugs.  No Known Allergies  Medications: Patient's Medications  New Prescriptions   No medications on file  Previous Medications   ASPIRIN EC 81 MG TABLET    Take 1 tablet (81 mg total) by mouth daily.   HYOSCYAMINE (LEVSIN SL) 0.125 MG SL TABLET    Place 1 tablet (0.125 mg total) under the tongue every 4 (four) hours as needed (1-2 every 4-6 hours as needed for abdominal cramping).   LABETALOL (NORMODYNE) 300 MG TABLET    Take 2 tablets (600 mg total) by mouth 3 (three) times daily.   NICARDIPINE (CARDENE) 20 MG CAPSULE    Take 2 capsules (40 mg total) by mouth every 8 (eight) hours.   PRAVASTATIN (PRAVACHOL) 10 MG TABLET    TAKE ONE TABLET BY MOUTH ONCE DAILY AT BEDTIME  Modified Medications   No medications on file  Discontinued Medications   No medications on file    Review of Systems  Constitutional: Negative for fever, chills, activity change, appetite change and unexpected weight change.  Respiratory: Negative for shortness of breath.   Cardiovascular: Negative for chest pain, palpitations and leg swelling.  Gastrointestinal: Positive for abdominal pain. Negative for nausea, vomiting, diarrhea, constipation, blood in stool, abdominal distention and rectal pain.  Genitourinary: Positive for vaginal bleeding. Negative for vaginal discharge, difficulty urinating, genital sores and vaginal pain.    Filed Vitals:   10/23/15 0939  BP: 140/96  Pulse: 72  Temp: 98 F (36.7 C)  TempSrc: Oral  Resp: 20  Height: 5\' 3"  (1.6 m)  Weight: 206 lb 3.2 oz (93.532 kg)  SpO2: 97%   Body mass index is 36.54 kg/(m^2).  Physical Exam  Constitutional: She appears well-developed and well-nourished. No distress.    Cardiovascular: Normal rate, regular rhythm, normal heart sounds and intact distal pulses.  Exam reveals no gallop and no friction rub.   No murmur heard. Pulmonary/Chest: Effort normal and breath sounds normal. No respiratory distress. She has no wheezes. She has no rales.  Abdominal: Soft. Bowel sounds are normal. She exhibits no distension and no mass. There is tenderness (midl lower quadrant but no r/g/r). There is no rebound and no guarding.  Genitourinary:  Pt declined exam  Skin: Skin is warm and dry. No rash noted.  Psychiatric: She has a normal mood and affect. Her behavior is normal. Thought content normal.     Labs reviewed: Admission on 10/22/2015, Discharged on 10/22/2015  Component Date Value Ref Range Status  . Sodium 10/22/2015 141  135 - 145 mmol/L Final  . Potassium 10/22/2015 4.1  3.5 - 5.1 mmol/L Final  . Chloride 10/22/2015 102  101 - 111 mmol/L Final  . BUN 10/22/2015 15  6 - 20 mg/dL Final  . Creatinine, Ser 10/22/2015 1.40* 0.44 - 1.00 mg/dL Final  . Glucose, Bld 10/22/2015 96  65 - 99 mg/dL Final  . Calcium, Ion 10/22/2015 1.22  1.13 - 1.30 mmol/L Final  . TCO2 10/22/2015 27  0 - 100 mmol/L Final  . Hemoglobin 10/22/2015 12.9  12.0 - 15.0 g/dL Final  . HCT 10/22/2015 38.0  36.0 - 46.0 % Final  Office Visit on 08/07/2015  Component Date Value Ref Range Status  . Glucose 08/07/2015 104* 65 - 99 mg/dL Final  . BUN 08/07/2015 16  8 - 27 mg/dL Final  . Creatinine, Ser 08/07/2015 1.64* 0.57 - 1.00 mg/dL Final  . GFR calc non Af Amer 08/07/2015 34* >59 mL/min/1.73 Final  . GFR calc Af Amer 08/07/2015 39* >59 mL/min/1.73 Final  . BUN/Creatinine Ratio 08/07/2015 10* 11 - 26 Final  . Sodium 08/07/2015 140  134 - 144 mmol/L Final  . Potassium 08/07/2015 3.7  3.5 - 5.2 mmol/L Final  . Chloride 08/07/2015 97  97 - 108 mmol/L Final  . CO2 08/07/2015 25  18 - 29 mmol/L Final  . Calcium 08/07/2015 9.5  8.7 - 10.3 mg/dL Final  . ALT 08/07/2015 17  0 - 32 IU/L Final  .  Cholesterol, Total 08/07/2015 189  100 - 199 mg/dL Final  . Triglycerides 08/07/2015 118  0 - 149 mg/dL Final  . HDL 08/07/2015 66  >39 mg/dL Final   Comment: According to ATP-III Guidelines, HDL-C >59 mg/dL is considered a negative risk factor for CHD.   Marland Kitchen VLDL Cholesterol Cal 08/07/2015 24  5 - 40 mg/dL Final  . LDL Calculated 08/07/2015  99  0 - 99 mg/dL Final  . Chol/HDL Ratio 08/07/2015 2.9  0.0 - 4.4 ratio units Final   Comment:                                   T. Chol/HDL Ratio                                             Men  Women                               1/2 Avg.Risk  3.4    3.3                                   Avg.Risk  5.0    4.4                                2X Avg.Risk  9.6    7.1                                3X Avg.Risk 23.4   11.0     No results found.   Assessment/Plan   ICD-9-CM ICD-10-CM   1. Postmenopausal bleeding - new 627.1 N95.0 Ambulatory referral to Gynecology  2. Lower abdominal pain - related to #1 789.09 R10.30   3.      Essential HTN - stable  Continue current medications as ordered  She received education handout at the ER  Will call with GYN referral  Follow up as scheduled  Go to the ER if increased bleeding, abdominal pain, fever/chills.  Evian Derringer S. Perlie Gold  [facility] and Adult Medicine [address] [place], [place] [address] [phone] Cell (Monday-Friday 8 AM - 5 PM) [phone] After 5 PM and follow prompts

## 2015-10-23 NOTE — Patient Instructions (Signed)
Continue current medications as ordered  She received education handout at the ER  Will call with GYN referral  Follow up as scheduled  Go to the ER if increased bleeding, abdominal pain, fever/chills.

## 2015-10-31 DIAGNOSIS — D259 Leiomyoma of uterus, unspecified: Secondary | ICD-10-CM | POA: Diagnosis not present

## 2015-10-31 DIAGNOSIS — N950 Postmenopausal bleeding: Secondary | ICD-10-CM | POA: Diagnosis not present

## 2015-10-31 DIAGNOSIS — N840 Polyp of corpus uteri: Secondary | ICD-10-CM | POA: Diagnosis not present

## 2015-10-31 DIAGNOSIS — R87619 Unspecified abnormal cytological findings in specimens from cervix uteri: Secondary | ICD-10-CM | POA: Diagnosis not present

## 2015-11-04 ENCOUNTER — Telehealth: Payer: Self-pay | Admitting: *Deleted

## 2015-11-04 ENCOUNTER — Other Ambulatory Visit: Payer: Self-pay | Admitting: Internal Medicine

## 2015-11-04 NOTE — Telephone Encounter (Signed)
She may take OTC tylenol cold and flu for her sx's

## 2015-11-04 NOTE — Telephone Encounter (Signed)
Patient Notified and stated " I don't think this is going to work, I want an antibiotic" told patient to give it a chance and she stated she will.

## 2015-11-04 NOTE — Telephone Encounter (Signed)
Patient called and stated that she has cold Symptoms and wants something called in before it gets worse. Patient stated that she does not want to come into the office for a appointment.  Stated that she has nasal congestion, clear. No fever, No cough, No chest congestion. Please Advise.

## 2015-11-06 ENCOUNTER — Other Ambulatory Visit: Payer: Self-pay | Admitting: Internal Medicine

## 2015-11-08 NOTE — Telephone Encounter (Signed)
Have called patient a second time still no answer left message on machine for her to call office about the medication ( Cozaar )

## 2015-11-13 NOTE — Telephone Encounter (Signed)
Called left message for patient to call office 

## 2015-11-15 ENCOUNTER — Telehealth: Payer: Self-pay | Admitting: *Deleted

## 2015-11-15 MED ORDER — LOSARTAN POTASSIUM 100 MG PO TABS: ORAL_TABLET | ORAL | Status: DC

## 2015-11-15 NOTE — Telephone Encounter (Signed)
Patient called and wanted a Refill on Losartan 100mg  once daily. This medication is not in her current medication list and not mentioned in last OV note. Patient stated that she has been taking this and needs refill. Please Advise.

## 2015-11-15 NOTE — Telephone Encounter (Signed)
Ok to [employer]. She was taking it back in June. Not sure how or why it was d/c'd

## 2015-11-15 NOTE — Telephone Encounter (Signed)
Patient notified and faxed to pharmacy.  

## 2015-12-05 DIAGNOSIS — N950 Postmenopausal bleeding: Secondary | ICD-10-CM | POA: Diagnosis not present

## 2015-12-18 ENCOUNTER — Encounter (HOSPITAL_COMMUNITY): Payer: Self-pay

## 2015-12-18 ENCOUNTER — Encounter (HOSPITAL_COMMUNITY)
Admission: RE | Admit: 2015-12-18 | Discharge: 2015-12-18 | Disposition: A | Discharge status: Home / Self Care | Payer: Commercial Managed Care - HMO | Source: Ambulatory Visit | Attending: Obstetrics & Gynecology | Admitting: Obstetrics & Gynecology

## 2015-12-18 DIAGNOSIS — I10 Essential (primary) hypertension: Secondary | ICD-10-CM | POA: Diagnosis not present

## 2015-12-18 DIAGNOSIS — N840 Polyp of corpus uteri: Secondary | ICD-10-CM | POA: Diagnosis not present

## 2015-12-18 DIAGNOSIS — G35 Multiple sclerosis: Secondary | ICD-10-CM | POA: Diagnosis not present

## 2015-12-18 DIAGNOSIS — Z8673 Personal history of transient ischemic attack (TIA), and cerebral infarction without residual deficits: Secondary | ICD-10-CM | POA: Diagnosis not present

## 2015-12-18 DIAGNOSIS — N950 Postmenopausal bleeding: Secondary | ICD-10-CM | POA: Diagnosis not present

## 2015-12-18 DIAGNOSIS — Z87891 Personal history of nicotine dependence: Secondary | ICD-10-CM | POA: Diagnosis not present

## 2015-12-18 LAB — BASIC METABOLIC PANEL
Anion gap: 8 (ref 5–15)
BUN: 21 mg/dL — AB (ref 6–20)
CALCIUM: 9.4 mg/dL (ref 8.9–10.3)
CO2: 28 mmol/L (ref 22–32)
CREATININE: 1.47 mg/dL — AB (ref 0.44–1.00)
Chloride: 104 mmol/L (ref 101–111)
GFR calc Af Amer: 44 mL/min — ABNORMAL LOW (ref >60)
GFR, EST NON AFRICAN AMERICAN: 38 mL/min — AB (ref >60)
GLUCOSE: 100 mg/dL — AB (ref 65–99)
Potassium: 3.9 mmol/L (ref 3.5–5.1)
Sodium: 140 mmol/L (ref 135–145)

## 2015-12-18 LAB — CBC
HEMATOCRIT: 35.6 % — AB (ref 36.0–46.0)
Hemoglobin: 12.1 g/dL (ref 12.0–15.0)
MCH: 31.6 pg (ref 26.0–34.0)
MCHC: 34 g/dL (ref 30.0–36.0)
MCV: 93 fL (ref 78.0–100.0)
PLATELETS: 295 10*3/uL (ref 150–400)
RBC: 3.83 MIL/uL — ABNORMAL LOW (ref 3.87–5.11)
RDW: 13.4 % (ref 11.5–15.5)
WBC: 5.3 10*3/uL (ref 4.0–10.5)

## 2015-12-18 NOTE — Patient Instructions (Addendum)
   Your procedure is scheduled on: February 2 (THURSDAY)  Enter through the Main Entrance of [facility] at: Manuel Garcia up the phone at the desk and dial [phone] and inform us of your arrival.  Please call this number if you have any problems the morning of surgery: [phone]  Remember: Do not eat food after midnight: February 1 Do not drink clear liquids after: 900AM DAY OF SURGERY  Take these medicines the morning of surgery with a SIP OF WATER:  Take all 3 blood pressure meds day of surgery  Do not wear jewelry, make-up, or FINGER nail polish No metal in your hair or on your body. Do not wear lotions, powders, perfumes.  You may wear deodorant.  Do not bring valuables to the hospital. Contacts, dentures or bridgework may not be worn into surgery.   Patients discharged on the day of surgery will not be allowed to drive home.

## 2015-12-18 NOTE — H&P (Signed)
Michele Page is an 60 y.o. female with postmenopausal bleeding.  SIS shows 2.5 cm endometrial polyp.  Patient has significant PMH for stroke in 2013, MS and HTN.  Pertinent Gynecological History: Menses: post-menopausal Bleeding: post menopausal bleeding and n/a Contraception: post menopausal status DES exposure: unknown Blood transfusions: none Sexually transmitted diseases: no past history Previous GYN Procedures: DNC and BTL  Last mammogram: normal Date: 2014 Last pap: normal Date:  2014 OB History: G3, P3  Menstrual History: Menarche age: n/a No LMP recorded. Patient is postmenopausal.    Past Medical History  Diagnosis Date  . Stroke [facility])     2013  . Hypertension   . Multiple sclerosis ([place])   . Vision abnormalities   . Colon polyps   . COPD (chronic obstructive pulmonary disease) ([place])     per pt she does not have it    Past Surgical History  Procedure Laterality Date  . Vaginal delivery      x3  . Tubal ligation      Family History  Problem Relation Age of Onset  . Stroke Sister   . Alcohol abuse Brother   . HIV/AIDS Brother   . Hypertension Mother   . Stroke Mother     Social History:  reports that she quit smoking about 13 years ago. Her smoking use included Cigarettes. She has a 12.5 pack-year smoking history. She has never used smokeless tobacco. She reports that she drinks alcohol. She reports that she does not use illicit drugs.  Allergies: No Known Allergies  No prescriptions prior to admission    ROS  There were no vitals taken for this visit. Physical Exam  Constitutional: She is oriented to person, place, and time. She appears well-developed and well-nourished.  Cardiovascular: Normal rate and regular rhythm.   GI: Soft. There is no rebound and no guarding.  Neurological: She is alert and oriented to person, place, and time.  Skin: Skin is warm and dry.  Psychiatric: She has a normal mood and affect. Her behavior is normal.    Results  for orders placed or performed during the hospital encounter of 12/18/15 (from the past 24 hour(s))  CBC     Status: Abnormal   Collection Time: 12/18/15 11:25 AM  Result Value Ref Range   WBC 5.3 4.0 - 10.5 K/uL   RBC 3.83 (L) 3.87 - 5.11 MIL/uL   Hemoglobin 12.1 12.0 - 15.0 g/dL   HCT 35.6 (L) 36.0 - 46.0 %   MCV 93.0 78.0 - 100.0 fL   MCH 31.6 26.0 - 34.0 pg   MCHC 34.0 30.0 - 36.0 g/dL   RDW 13.4 11.5 - 15.5 %   Platelets 295 150 - 400 K/uL  Basic metabolic panel     Status: Abnormal   Collection Time: 12/18/15 11:25 AM  Result Value Ref Range   Sodium 140 135 - 145 mmol/L   Potassium 3.9 3.5 - 5.1 mmol/L   Chloride 104 101 - 111 mmol/L   CO2 28 22 - 32 mmol/L   Glucose, Bld 100 (H) 65 - 99 mg/dL   BUN 21 (H) 6 - 20 mg/dL   Creatinine, Ser 1.47 (H) 0.44 - 1.00 mg/dL   Calcium 9.4 8.9 - 10.3 mg/dL   GFR calc non Af Amer 38 (L) >60 mL/min   GFR calc Af Amer 44 (L) >60 mL/min   Anion gap 8 5 - 15    No results found.  Assessment/Plan: 60yo G3P3 with PMB -H/S, D&C -Patient  is counseled for above procedure including risk of bleeding, infection, scarring and damage to surrounding structures.  All questions were answered and the patient wishes to proceed.  Leonidas Boateng, [place] 12/18/2015, 9:10 PM

## 2015-12-19 ENCOUNTER — Encounter (HOSPITAL_COMMUNITY): Payer: Self-pay | Admitting: Anesthesiology

## 2015-12-19 ENCOUNTER — Ambulatory Visit (HOSPITAL_COMMUNITY)
Admission: RE | Admit: 2015-12-19 | Discharge: 2015-12-19 | Disposition: A | Discharge status: Home / Self Care | Payer: Commercial Managed Care - HMO | Source: Ambulatory Visit | Attending: Obstetrics & Gynecology | Admitting: Obstetrics & Gynecology

## 2015-12-19 ENCOUNTER — Encounter (HOSPITAL_COMMUNITY)
Admission: RE | Disposition: A | Discharge status: Home / Self Care | Payer: Self-pay | Source: Ambulatory Visit | Attending: Obstetrics & Gynecology

## 2015-12-19 ENCOUNTER — Ambulatory Visit (HOSPITAL_COMMUNITY): Payer: Commercial Managed Care - HMO | Admitting: Anesthesiology

## 2015-12-19 DIAGNOSIS — Z87891 Personal history of nicotine dependence: Secondary | ICD-10-CM | POA: Diagnosis not present

## 2015-12-19 DIAGNOSIS — G35 Multiple sclerosis: Secondary | ICD-10-CM | POA: Diagnosis not present

## 2015-12-19 DIAGNOSIS — Z8673 Personal history of transient ischemic attack (TIA), and cerebral infarction without residual deficits: Secondary | ICD-10-CM | POA: Diagnosis not present

## 2015-12-19 DIAGNOSIS — N950 Postmenopausal bleeding: Secondary | ICD-10-CM | POA: Diagnosis not present

## 2015-12-19 DIAGNOSIS — I10 Essential (primary) hypertension: Secondary | ICD-10-CM | POA: Insufficient documentation

## 2015-12-19 DIAGNOSIS — N840 Polyp of corpus uteri: Secondary | ICD-10-CM | POA: Insufficient documentation

## 2015-12-19 HISTORY — PX: HYSTEROSCOPY WITH D & C: SHX1775

## 2015-12-19 SURGERY — DILATATION AND CURETTAGE /HYSTEROSCOPY
Anesthesia: General

## 2015-12-19 MED ORDER — FENTANYL CITRATE (PF) 100 MCG/2ML IJ SOLN
INTRAMUSCULAR | Status: AC
  Filled 2015-12-19: qty 2

## 2015-12-19 MED ORDER — DEXAMETHASONE SODIUM PHOSPHATE 4 MG/ML IJ SOLN
INTRAMUSCULAR | Status: DC | PRN
  Administered 2015-12-19: 10 mg via INTRAVENOUS

## 2015-12-19 MED ORDER — FENTANYL CITRATE (PF) 100 MCG/2ML IJ SOLN
25.0000 ug | INTRAMUSCULAR | Status: DC | PRN
  Administered 2015-12-19: 50 ug via INTRAVENOUS
  Administered 2015-12-19: 25 ug via INTRAVENOUS

## 2015-12-19 MED ORDER — FENTANYL CITRATE (PF) 100 MCG/2ML IJ SOLN
INTRAMUSCULAR | Status: DC | PRN
  Administered 2015-12-19 (×2): 50 ug via INTRAVENOUS

## 2015-12-19 MED ORDER — PROMETHAZINE HCL 25 MG/ML IJ SOLN: 6.2500 mg | INTRAMUSCULAR | Status: DC | PRN

## 2015-12-19 MED ORDER — LACTATED RINGERS IV SOLN
INTRAVENOUS | Status: DC
  Administered 2015-12-19 (×2): via INTRAVENOUS

## 2015-12-19 MED ORDER — OXYCODONE-ACETAMINOPHEN 5-325 MG PO TABS: 1.0000 | ORAL_TABLET | ORAL | Status: DC | PRN

## 2015-12-19 MED ORDER — MIDAZOLAM HCL 2 MG/2ML IJ SOLN
INTRAMUSCULAR | Status: AC
  Filled 2015-12-19: qty 2

## 2015-12-19 MED ORDER — PROPOFOL 10 MG/ML IV BOLUS
INTRAVENOUS | Status: AC
  Filled 2015-12-19: qty 20

## 2015-12-19 MED ORDER — FENTANYL CITRATE (PF) 100 MCG/2ML IJ SOLN
INTRAMUSCULAR | Status: AC
  Administered 2015-12-19: 25 ug via INTRAVENOUS
  Filled 2015-12-19: qty 2

## 2015-12-19 MED ORDER — IBUPROFEN 600 MG PO TABS: 600.0000 mg | ORAL_TABLET | Freq: Four times a day (QID) | ORAL | Status: DC | PRN

## 2015-12-19 MED ORDER — LIDOCAINE HCL (CARDIAC) 20 MG/ML IV SOLN
INTRAVENOUS | Status: AC
  Filled 2015-12-19: qty 5

## 2015-12-19 MED ORDER — PROPOFOL 10 MG/ML IV BOLUS
INTRAVENOUS | Status: DC | PRN
  Administered 2015-12-19: 150 mg via INTRAVENOUS

## 2015-12-19 MED ORDER — MEPERIDINE HCL 25 MG/ML IJ SOLN: 6.2500 mg | INTRAMUSCULAR | Status: DC | PRN

## 2015-12-19 MED ORDER — DEXAMETHASONE SODIUM PHOSPHATE 10 MG/ML IJ SOLN
INTRAMUSCULAR | Status: AC
Start: 2015-12-19 — End: 2015-12-19
  Filled 2015-12-19: qty 1

## 2015-12-19 MED ORDER — ONDANSETRON HCL 4 MG/2ML IJ SOLN
INTRAMUSCULAR | Status: DC | PRN
  Administered 2015-12-19: 4 mg via INTRAVENOUS

## 2015-12-19 MED ORDER — LACTATED RINGERS IV SOLN: INTRAVENOUS | Status: DC

## 2015-12-19 MED ORDER — KETOROLAC TROMETHAMINE 30 MG/ML IJ SOLN
INTRAMUSCULAR | Status: AC
Start: 2015-12-19 — End: 2015-12-19
  Filled 2015-12-19: qty 1

## 2015-12-19 MED ORDER — LABETALOL HCL 5 MG/ML IV SOLN
INTRAVENOUS | Status: AC
  Administered 2015-12-19: 5 mg via INTRAVENOUS
  Filled 2015-12-19: qty 4

## 2015-12-19 MED ORDER — LIDOCAINE HCL (CARDIAC) 20 MG/ML IV SOLN
INTRAVENOUS | Status: DC | PRN
  Administered 2015-12-19: 100 mg via INTRAVENOUS

## 2015-12-19 MED ORDER — LABETALOL HCL 5 MG/ML IV SOLN
5.0000 mg | INTRAVENOUS | Status: AC | PRN
  Administered 2015-12-19 (×4): 5 mg via INTRAVENOUS

## 2015-12-19 MED ORDER — ONDANSETRON HCL 4 MG/2ML IJ SOLN
INTRAMUSCULAR | Status: AC
  Filled 2015-12-19: qty 2

## 2015-12-19 MED ORDER — MIDAZOLAM HCL 2 MG/2ML IJ SOLN: 0.5000 mg | Freq: Once | INTRAMUSCULAR | Status: DC | PRN

## 2015-12-19 MED ORDER — MIDAZOLAM HCL 5 MG/5ML IJ SOLN
INTRAMUSCULAR | Status: DC | PRN
  Administered 2015-12-19: 2 mg via INTRAVENOUS

## 2015-12-19 SURGICAL SUPPLY — 20 items
ABLATOR ENDOMETRIAL BIPOLAR (ABLATOR) IMPLANT
CANISTER SUCT 3000ML (MISCELLANEOUS) ×2 IMPLANT
CATH ROBINSON RED A/P 16FR (CATHETERS) ×2 IMPLANT
CLOTH BEACON ORANGE TIMEOUT ST (SAFETY) ×2 IMPLANT
CONTAINER PREFILL 10% NBF 60ML (FORM) ×4 IMPLANT
ELECT REM PT RETURN 9FT ADLT (ELECTROSURGICAL) ×2
ELECTRODE REM PT RTRN 9FT ADLT (ELECTROSURGICAL) ×1 IMPLANT
GLOVE BIO SURGEON STRL SZ 6 (GLOVE) ×2 IMPLANT
GLOVE BIOGEL PI IND STRL 6 (GLOVE) ×2 IMPLANT
GLOVE BIOGEL PI IND STRL 7.0 (GLOVE) ×1 IMPLANT
GLOVE BIOGEL PI INDICATOR 6 (GLOVE) ×2
GLOVE BIOGEL PI INDICATOR 7.0 (GLOVE) ×1
GOWN STRL REUS W/TWL LRG LVL3 (GOWN DISPOSABLE) ×4 IMPLANT
LOOP ANGLED CUTTING 22FR (CUTTING LOOP) IMPLANT
PACK VAGINAL MINOR WOMEN LF (CUSTOM PROCEDURE TRAY) ×2 IMPLANT
PAD OB MATERNITY 4.3X12.25 (PERSONAL CARE ITEMS) ×2 IMPLANT
TOWEL OR 17X24 6PK STRL BLUE (TOWEL DISPOSABLE) ×4 IMPLANT
TUBING AQUILEX INFLOW (TUBING) ×2 IMPLANT
TUBING AQUILEX OUTFLOW (TUBING) ×2 IMPLANT
WATER STERILE IRR 1000ML POUR (IV SOLUTION) ×2 IMPLANT

## 2015-12-19 NOTE — Anesthesia Procedure Notes (Signed)
Procedure Name: LMA Insertion Date/Time: 12/19/2015 1:20 PM Performed by: Riki Sheer Pre-anesthesia Checklist: Patient identified, Emergency Drugs available, Suction available, Patient being monitored and Timeout performed Patient Re-evaluated:Patient Re-evaluated prior to inductionOxygen Delivery Method: Circle system utilized Preoxygenation: Pre-oxygenation with 100% oxygen Intubation Type: IV induction Ventilation: Mask ventilation without difficulty LMA: LMA inserted LMA Size: 4.0 Number of attempts: 1 Placement Confirmation: positive ETCO2,  CO2 detector and breath sounds checked- equal and bilateral Tube secured with: Tape Dental Injury: Teeth and Oropharynx as per pre-operative assessment

## 2015-12-19 NOTE — Progress Notes (Signed)
No change to H&P.  Ysabelle Goodroe, DO 

## 2015-12-19 NOTE — Anesthesia Postprocedure Evaluation (Signed)
Anesthesia Post Note  Patient: Michele Page  Procedure(s) Performed: Procedure(s) (LRB): DILATATION AND CURETTAGE /HYSTEROSCOPY (N/A)  Patient location during evaluation: PACU Anesthesia Type: General Level of consciousness: awake and alert, oriented and patient cooperative Pain management: pain level controlled Vital Signs Assessment: post-procedure vital signs reviewed and stable Respiratory status: spontaneous breathing, nonlabored ventilation and respiratory function stable Cardiovascular status: blood pressure returned to baseline and stable Postop Assessment: no signs of nausea or vomiting Anesthetic complications: no    Last Vitals:  Filed Vitals:   12/19/15 1620 12/19/15 1621  BP: 163/92 154/91  Pulse: 76   Temp: 36.8 C   Resp: 17     Last Pain:  Filed Vitals:   12/19/15 1622  PainSc: 2                  Monai Hindes,E. Herschell Virani

## 2015-12-19 NOTE — Transfer of Care (Signed)
Immediate Anesthesia Transfer of Care Note  Patient: Michele Page  Procedure(s) Performed: Procedure(s): DILATATION AND CURETTAGE /HYSTEROSCOPY (N/A)  Patient Location: PACU  Anesthesia Type:General  Level of Consciousness: awake, alert  and oriented  Airway & Oxygen Therapy: Patient Spontanous Breathing and Patient connected to nasal cannula oxygen  Post-op Assessment: Report given to RN and Post -op Vital signs reviewed and stable  Post vital signs: Reviewed and stable  Last Vitals:  Filed Vitals:   12/19/15 1119 12/19/15 1140  BP:  164/93  Pulse: 66   Temp: 36.8 C   Resp: 20     Complications: No apparent anesthesia complications

## 2015-12-19 NOTE — Discharge Instructions (Signed)
Call MD for T>100.4, heavy vaginal bleeding, severe abdominal pain, intractable nausea and/or vomiting, or respiratory distress.  Call office to schedule postop appointment in 2 weeks.  No driving while taking narcotics.  Pelvic rest x 4 weeks.  No exercise until Monday.  Patient is alert and oriented.  Discharge communication reviewed with patient and patient verbalized understanding.  Follow up care reviewed and patient verbalized understanding and the need for follow up care.  Patient was discharged to the waiting room by this RN. DISCHARGE INSTRUCTIONS: D&C / D&E The following instructions have been prepared to help you care for yourself upon your return home.   Personal hygiene:  Use sanitary pads for vaginal drainage, not tampons.  Shower the day after your procedure.  NO tub baths, pools or Jacuzzis for 2-3 weeks.  Wipe front to back after using the bathroom.  Activity and limitations:  Do NOT drive or operate any equipment for 24 hours. The effects of anesthesia are still present and drowsiness may result.  Do NOT rest in bed all day.  Walking is encouraged.  Walk up and down stairs slowly.  You may resume your normal activity in one to two days or as indicated by your physician.  Sexual activity: NO intercourse for at least 2 weeks after the procedure, or as indicated by your physician.  Diet: Eat a light meal as desired this evening. You may resume your usual diet tomorrow.  Return to work: You may resume your work activities in one to two days or as indicated by your doctor.  What to expect after your surgery: Expect to have vaginal bleeding/discharge for 2-3 days and spotting for up to 10 days. It is not unusual to have soreness for up to 1-2 weeks. You may have a slight burning sensation when you urinate for the first day. Mild cramps may continue for a couple of days. You may have a regular period in 2-6 weeks.  Call your doctor for any of the following:  Excessive  vaginal bleeding, saturating and changing one pad every hour.  Inability to urinate 6 hours after discharge from hospital.  Pain not relieved by pain medication.  Fever of 100.4 F or greater.  Unusual vaginal discharge or odor.   Call for an appointment:    Patients signature: ______________________  Nurses signature ________________________  Support person's signature_______________________

## 2015-12-19 NOTE — Op Note (Signed)
PREOPERATIVE DIAGNOSIS:  60 y.o. with postmenopausal bleeding  POSTOPERATIVE DIAGNOSIS: The same  PROCEDURE: Hysteroscopy, Dilation and Curettage.  SURGEON:  Dr. Linda Hedges  INDICATIONS: 60 y.o. here for scheduled surgery for recent history of postmenopausal bleeding.  Plans for hysteroscopy, D&C.   Risks of surgery were discussed with the patient including but not limited to: bleeding which may require transfusion; infection which may require antibiotics; injury to uterus or surrounding organs; intrauterine scarring which may impair future fertility; need for additional procedures including laparotomy or laparoscopy; and other postoperative/anesthesia complications. Written informed consent was obtained.    FINDINGS:  A 8 week size uterus.  Cavity with atrophic appearance with exception of polyp attached to posterior right wall.  Normal ostia bilaterally.  ANESTHESIA:   General  ESTIMATED BLOOD LOSS:  Less than 20 ml  SPECIMENS: Endometrial curettings sent to pathology  COMPLICATIONS:  None immediate.  PROCEDURE DETAILS:  The patient was taken to the operating room where general anesthesia was administered and was found to be adequate.  After an adequate timeout was performed, she was placed in the dorsal lithotomy position and examined; then prepped and draped in the sterile manner.   Her bladder was catheterized for an unmeasured amount of clear, yellow urine. A speculum was then placed in the patient's vagina and a single tooth tenaculum was applied to the anterior lip of the cervix.  The uteus was sounded to 8 cm and dilated manually with metal dilators to accommodate the 5.5 mm diagnostic hysteroscope.  Once the cervix was dilated, the hysteroscope was inserted under direct visualization using saline as a suspension medium.  The uterine cavity was carefully examined, both ostia were recognized. An endometrial polyp was noted attached at the posterior right wall.  After further careful  visualization of the uterine cavity, the hysteroscope was removed under direct visualization.  A sharp curettage was then performed to obtain a scant amount of endometrial curettings.  Second look with the hysteroscope showed an entirely normal appearing endometrial cavity; absent polyp.  The tenaculum was removed from the anterior lip of the cervix and the vaginal speculum was removed after noting good hemostasis.  The patient tolerated the procedure well and was taken to the recovery area awake, extubated and in stable condition.

## 2015-12-19 NOTE — Anesthesia Preprocedure Evaluation (Addendum)
Anesthesia Evaluation  Patient identified by MRN, date of birth, ID band Patient awake    Reviewed: Allergy & Precautions, NPO status , Patient's Chart, lab work & pertinent test results, reviewed documented beta blocker date and time   History of Anesthesia Complications Negative for: history of anesthetic complications  Airway Mallampati: III  TM Distance: >3 FB Neck ROM: Full    Dental  (+) Chipped, Poor Dentition, Dental Advisory Given, Missing   Pulmonary COPD, former smoker (quit '03),    breath sounds clear to auscultation       Cardiovascular hypertension, Pt. on medications and Pt. on home beta blockers (-) angina Rhythm:Regular Rate:Normal  '15 ECHO: EF 63%, valves OK   Neuro/Psych MS: walks OK CVA, No Residual Symptoms    GI/Hepatic negative GI ROS, Neg liver ROS,   Endo/Other    Renal/GU Renal InsufficiencyRenal disease (creat 1.47)     Musculoskeletal   Abdominal (+) + obese,   Peds  Hematology   Anesthesia Other Findings   Reproductive/Obstetrics                            Anesthesia Physical Anesthesia Plan  ASA: III  Anesthesia Plan: General   Post-op Pain Management:    Induction: Intravenous  Airway Management Planned: LMA  Additional Equipment:   Intra-op Plan:   Post-operative Plan:   Informed Consent: I have reviewed the patients History and Physical, chart, labs and discussed the procedure including the risks, benefits and alternatives for the proposed anesthesia with the patient or authorized representative who has indicated his/her understanding and acceptance.   Dental advisory given  Plan Discussed with: CRNA and Surgeon  Anesthesia Plan Comments: (Plan routine monitors, GA- LMA OK)        Anesthesia Quick Evaluation

## 2015-12-20 ENCOUNTER — Encounter (HOSPITAL_COMMUNITY): Payer: Self-pay | Admitting: Obstetrics & Gynecology

## 2016-01-01 ENCOUNTER — Telehealth: Payer: Self-pay | Admitting: Internal Medicine

## 2016-01-01 NOTE — Telephone Encounter (Signed)
Sending Mammogram overdue letter..Carolin Coy

## 2016-01-02 ENCOUNTER — Encounter: Payer: Self-pay | Admitting: Internal Medicine

## 2016-01-09 ENCOUNTER — Encounter: Payer: Self-pay | Admitting: Internal Medicine

## 2016-01-10 ENCOUNTER — Encounter: Payer: Self-pay | Admitting: Internal Medicine

## 2016-01-10 ENCOUNTER — Ambulatory Visit (INDEPENDENT_AMBULATORY_CARE_PROVIDER_SITE_OTHER): Payer: Commercial Managed Care - HMO | Admitting: Internal Medicine

## 2016-01-10 VITALS — BP 128/92 | HR 63 | Temp 98.1°F | Resp 20 | Ht 63.0 in | Wt 200.8 lb

## 2016-01-10 DIAGNOSIS — N183 Chronic kidney disease, stage 3 (moderate): Secondary | ICD-10-CM | POA: Diagnosis not present

## 2016-01-10 DIAGNOSIS — E785 Hyperlipidemia, unspecified: Secondary | ICD-10-CM | POA: Diagnosis not present

## 2016-01-10 DIAGNOSIS — N189 Chronic kidney disease, unspecified: Secondary | ICD-10-CM | POA: Insufficient documentation

## 2016-01-10 DIAGNOSIS — I10 Essential (primary) hypertension: Secondary | ICD-10-CM | POA: Insufficient documentation

## 2016-01-10 DIAGNOSIS — N184 Chronic kidney disease, stage 4 (severe): Secondary | ICD-10-CM | POA: Insufficient documentation

## 2016-01-10 DIAGNOSIS — I6930 Unspecified sequelae of cerebral infarction: Secondary | ICD-10-CM | POA: Insufficient documentation

## 2016-01-10 DIAGNOSIS — K219 Gastro-esophageal reflux disease without esophagitis: Secondary | ICD-10-CM | POA: Insufficient documentation

## 2016-01-10 NOTE — Progress Notes (Signed)
Patient ID: Marrian Bells, female   DOB: 1956/04/23, 60 y.o.   MRN: [id number]    Location:    [facility]   Place of Service:   OFFICE  Chief Complaint  Patient presents with  . Medical Management of Chronic Issues    Follow-up for medication    HPI:  60 yo female seen today for f/u. She had a hysteroscopy on 2/2nd - endometrial polyp removed which was benign. No further vaginal bleeding. She is a poor historian due to expressive aphasia. Hx obtained from chart  HTN - stable on nicardipine, labetalol and losartan. BP at home 130s/70-80s. She does take meds at same time every day  COPD - no exacerbations. No issues with breathing  Hx CVA - no new sx's. Takes statin and ASA daily. LDL 99  MS - no obvious sx's - no diplopia but she cannot "stare" for prolonged time without blinking.  Gastritis/GERD - she stopped her nexium on her own.  She had a colonoscopy last yr and tubular adenoma polyps removed. No acid reflux sx's.  CKD - probably due to HTN. Cr stable at 1.47   Past Medical History  Diagnosis Date  . Stroke [facility]     2013  . Hypertension   . Multiple sclerosis ([place])   . Vision abnormalities   . Colon polyps   . COPD (chronic obstructive pulmonary disease) ([place])     per pt she does not have it    Past Surgical History  Procedure Laterality Date  . Vaginal delivery      x3  . Tubal ligation    . Hysteroscopy w/d&c N/A 12/19/2015    Procedure: DILATATION AND CURETTAGE /HYSTEROSCOPY;  Surgeon: Linda Hedges, DO;  Location: [facility];  Service: Gynecology;  Laterality: N/A;    Patient Care Team: Gildardo Cranker, DO as PCP - General (Internal Medicine)  Social History   Social History  . Marital Status: Married    Spouse Name: N/A  . Number of Children: 3  . Years of Education: N/A   Occupational History  . retired    Social History Main Topics  . Smoking status: Former Smoker -- 0.50 packs/day for 25 years    Types: Cigarettes    Quit date: 05/21/2002  . Smokeless  tobacco: Never Used  . Alcohol Use: 0.0 oz/week    0 Standard drinks or equivalent per week     Comment: occas  . Drug Use: No  . Sexual Activity: No   Other Topics Concern  . Not on file   Social History Narrative   Diet- N/A   Caffeine- Yes   Married- Yes   House- 2 story with 2 people   Pets- No   Current/past profession- Insurance underwriter daycare   Exercise- Yes   Living will-No   DNR-N/A   POA/HPOA-No           reports that she quit smoking about 13 years ago. Her smoking use included Cigarettes. She has a 12.5 pack-year smoking history. She has never used smokeless tobacco. She reports that she drinks alcohol. She reports that she does not use illicit drugs.  No Known Allergies  Medications: Patient's Medications  New Prescriptions   No medications on file  Previous Medications   ASPIRIN EC 81 MG TABLET    Take 1 tablet (81 mg total) by mouth daily.   IBUPROFEN (ADVIL,MOTRIN) 600 MG TABLET    Take 1 tablet (600 mg total) by mouth every 6 (six) hours as needed.  LABETALOL (NORMODYNE) 300 MG TABLET    Take 2 tablets (600 mg total) by mouth 3 (three) times daily.   LOSARTAN (COZAAR) 100 MG TABLET    Take one tablet by mouth once daily for blood pressure   NICARDIPINE (CARDENE) 20 MG CAPSULE    Take 2 capsules (40 mg total) by mouth every 8 (eight) hours.   PRAVASTATIN (PRAVACHOL) 10 MG TABLET    TAKE ONE TABLET BY MOUTH ONCE DAILY AT BEDTIME  Modified Medications   No medications on file  Discontinued Medications   HYOSCYAMINE (LEVSIN SL) 0.125 MG SL TABLET    Place 1 tablet (0.125 mg total) under the tongue every 4 (four) hours as needed (1-2 every 4-6 hours as needed for abdominal cramping).   OXYCODONE-ACETAMINOPHEN (ROXICET) 5-325 MG TABLET    Take 1-2 tablets by mouth every 4 (four) hours as needed for severe pain.    Review of Systems  Unable to perform ROS: Other  expressive aphasia  Filed Vitals:   01/10/16 0918  BP: 128/92  Pulse: 63  Temp: 98.1 F (36.7  C)  TempSrc: Oral  Resp: 20  Height: _0  (1.6 m)  Weight: 200 lb 12.8 oz (91.082 kg)  SpO2: 96%   Body mass index is 35.58 kg/(m^2).  Physical Exam  Constitutional: She is oriented to person, place, and time. She appears well-developed and well-nourished.  HENT:  Mouth/Throat: Oropharynx is clear and moist. No oropharyngeal exudate.  Eyes: Pupils are equal, round, and reactive to light. No scleral icterus.  Neck: Neck supple. Carotid bruit is not present. No tracheal deviation present. No thyromegaly present.  Cardiovascular: Normal rate, regular rhythm and intact distal pulses.  Exam reveals no gallop and no friction rub.   Murmur (1/6) heard. No LE edema b/l. no calf TTP.   Pulmonary/Chest: Effort normal and breath sounds normal. No stridor. No respiratory distress. She has no wheezes. She has no rales.  Abdominal: Soft. Bowel sounds are normal. She exhibits no distension and no mass. There is no hepatomegaly. There is no tenderness. There is no rebound and no guarding.  Lymphadenopathy:    She has no cervical adenopathy.  Neurological: She is alert and oriented to person, place, and time.  Skin: Skin is warm and dry. No rash noted.  Psychiatric: She has a normal mood and affect. Her behavior is normal. Thought content normal. Her speech is slurred.     Labs reviewed: Hospital Outpatient Visit on 12/18/2015  Component Date Value Ref Range Status  . WBC 12/18/2015 5.3  4.0 - 10.5 K/uL Final  . RBC 12/18/2015 3.83* 3.87 - 5.11 MIL/uL Final  . Hemoglobin 12/18/2015 12.1  12.0 - 15.0 g/dL Final  . HCT 12/18/2015 35.6* 36.0 - 46.0 % Final  . MCV 12/18/2015 93.0  78.0 - 100.0 fL Final  . MCH 12/18/2015 31.6  26.0 - 34.0 pg Final  . MCHC 12/18/2015 34.0  30.0 - 36.0 g/dL Final  . RDW 12/18/2015 13.4  11.5 - 15.5 % Final  . Platelets 12/18/2015 295  150 - 400 K/uL Final  . Sodium 12/18/2015 140  135 - 145 mmol/L Final  . Potassium 12/18/2015 3.9  3.5 - 5.1 mmol/L Final  .  Chloride 12/18/2015 104  101 - 111 mmol/L Final  . CO2 12/18/2015 28  22 - 32 mmol/L Final  . Glucose, Bld 12/18/2015 100* 65 - 99 mg/dL Final  . BUN 12/18/2015 21* 6 - 20 mg/dL Final  . Creatinine, Ser 12/18/2015 1.47* 0.44 -  1.00 mg/dL Final  . Calcium 12/18/2015 9.4  8.9 - 10.3 mg/dL Final  . GFR calc non Af Amer 12/18/2015 38* >60 mL/min Final  . GFR calc Af Amer 12/18/2015 44* >60 mL/min Final   Comment: (NOTE) The eGFR has been calculated using the CKD EPI equation. This calculation has not been validated in all clinical situations. eGFR's persistently <60 mL/min signify possible Chronic Kidney Disease.   . Anion gap 12/18/2015 8  5 - 15 Final  Admission on 10/22/2015, Discharged on 10/22/2015  Component Date Value Ref Range Status  . Sodium 10/22/2015 141  135 - 145 mmol/L Final  . Potassium 10/22/2015 4.1  3.5 - 5.1 mmol/L Final  . Chloride 10/22/2015 102  101 - 111 mmol/L Final  . BUN 10/22/2015 15  6 - 20 mg/dL Final  . Creatinine, Ser 10/22/2015 1.40* 0.44 - 1.00 mg/dL Final  . Glucose, Bld 10/22/2015 96  65 - 99 mg/dL Final  . Calcium, Ion 10/22/2015 1.22  1.13 - 1.30 mmol/L Final  . TCO2 10/22/2015 27  0 - 100 mmol/L Final  . Hemoglobin 10/22/2015 12.9  12.0 - 15.0 g/dL Final  . HCT 10/22/2015 38.0  36.0 - 46.0 % Final    No results found.   Assessment/Plan   ICD-9-CM ICD-10-CM   1. Essential hypertension 401.9 I10 ALT  2. History of stroke with residual deficit 438.9 I69.30 ALT  3. Gastroesophageal reflux disease without esophagitis 530.81 K21.9   4. Hyperlipidemia 272.4 E78.5 Lipid Panel  5. CKD (chronic kidney disease), stage 3 (moderate) 585.3 N18.3    Please contact insurance company for list of all blood pressure medications that are covered at lower cost  will call with lab results  Continue current medications as ordered  Follow up in 3 mos for routine visit    Shawnetta Lein S. Perlie Gold  [facility] [address] [place], [place] [address] [phone] Cell (Monday-Friday 8 AM - 5 PM) [phone] After 5 PM and follow prompts

## 2016-01-10 NOTE — Patient Instructions (Signed)
Please contact insurance company for list of all blood pressure medications that are covered at lower cost  will call with lab results  Continue current medications as ordered  Follow up in 3 mos for routine visit

## 2016-01-11 LAB — LIPID PANEL
CHOL/HDL RATIO: 2.7 ratio (ref 0.0–4.4)
Cholesterol, Total: 203 mg/dL — ABNORMAL HIGH (ref 100–199)
HDL: 75 mg/dL (ref >39)
LDL Calculated: 112 mg/dL — ABNORMAL HIGH (ref 0–99)
TRIGLYCERIDES: 80 mg/dL (ref 0–149)
VLDL Cholesterol Cal: 16 mg/dL (ref 5–40)

## 2016-01-11 LAB — ALT: ALT: 26 IU/L (ref 0–32)

## 2016-01-13 ENCOUNTER — Telehealth: Payer: Self-pay | Admitting: *Deleted

## 2016-01-13 NOTE — Telephone Encounter (Signed)
Not sure if those are the correct spelling for those meds. Can we verify before decision is made?

## 2016-01-13 NOTE — Telephone Encounter (Signed)
Patient called and gave these 3 Bp medications that is covered through the [employer]: Amitripyline 10mg , nifebipink 30mg , nifevcal 30mg  . Patient feels like these are not going to help. Patient states that what she is on now is $100 and she cant afford this. Please Advise.

## 2016-01-14 MED ORDER — NIFEDIPINE ER 30 MG PO TB24: ORAL_TABLET | ORAL | Status: DC

## 2016-01-14 NOTE — Telephone Encounter (Signed)
[employer] and the 3 that are covered are: Amitriptyline, Nifedipine and Nifedical (Nifedipine). Please Advise.

## 2016-01-14 NOTE — Telephone Encounter (Signed)
Patient notified and Rx faxed to pharmacy.  

## 2016-01-14 NOTE — Telephone Encounter (Signed)
[place] for nifedipine er 30 mg po take 1 daily #30 with 3 RF

## 2016-01-20 ENCOUNTER — Telehealth: Payer: Self-pay | Admitting: *Deleted

## 2016-01-20 MED ORDER — NIFEDIPINE ER 30 MG PO TB24: ORAL_TABLET | ORAL | Status: DC

## 2016-01-20 NOTE — Telephone Encounter (Signed)
Patient notified and agreed. Faxed Rx to pharmacy.  

## 2016-01-20 NOTE — Telephone Encounter (Signed)
Patient started the new BP medication Nifedipine and it is not working. BP 180/90. Patient thinks she needs to take 2 pills daily instead of 1. Please Advise.

## 2016-01-20 NOTE — Telephone Encounter (Signed)
Ok to increase nifedipine er 30mg  to 2 tabs po daily #60 with 3 RF. Hold if heart rate <60

## 2016-02-05 ENCOUNTER — Other Ambulatory Visit: Payer: Self-pay

## 2016-02-05 DIAGNOSIS — Z1231 Encounter for screening mammogram for malignant neoplasm of breast: Secondary | ICD-10-CM

## 2016-02-14 ENCOUNTER — Telehealth: Payer: Self-pay | Admitting: *Deleted

## 2016-02-14 NOTE — Telephone Encounter (Signed)
Which pill are you referring to?

## 2016-02-14 NOTE — Telephone Encounter (Signed)
Patient stated she is taking 2 tablets by mouth twice daily and wants a refill for this. Patient stated that this is working to control her blood pressure and pulse has been running above 60. Patient wants a 90 day supply. Please Advise.

## 2016-02-17 MED ORDER — NIFEDIPINE ER 30 MG PO TB24: ORAL_TABLET | ORAL | Status: DC

## 2016-02-17 NOTE — Telephone Encounter (Signed)
Nifedipine 30 mg

## 2016-02-17 NOTE — Telephone Encounter (Signed)
Ok for nifedipine xl 30mg  #360 take 2 tabs po BID with 1 RF Hold if HR <60

## 2016-02-17 NOTE — Telephone Encounter (Signed)
Patient notified and agreed. Medication list updated and Rx sent to pharmacy.  

## 2016-02-20 ENCOUNTER — Ambulatory Visit
Admission: RE | Admit: 2016-02-20 | Discharge: 2016-02-20 | Disposition: A | Discharge status: Home / Self Care | Payer: Commercial Managed Care - HMO | Source: Ambulatory Visit

## 2016-02-20 DIAGNOSIS — Z1231 Encounter for screening mammogram for malignant neoplasm of breast: Secondary | ICD-10-CM | POA: Diagnosis not present

## 2016-03-05 ENCOUNTER — Other Ambulatory Visit: Payer: Self-pay | Admitting: Internal Medicine

## 2016-03-05 DIAGNOSIS — R928 Other abnormal and inconclusive findings on diagnostic imaging of breast: Secondary | ICD-10-CM

## 2016-03-06 ENCOUNTER — Telehealth: Payer: Self-pay

## 2016-03-06 DIAGNOSIS — I10 Essential (primary) hypertension: Secondary | ICD-10-CM

## 2016-03-06 NOTE — Telephone Encounter (Signed)
Nifedipine ER Osmotic Release is not covered and requires a PA, patient calling requesting that we change medication. Patient was told by the insurance company and they will cover adalat (Nifidipine CC). Patient stressed that it needs to be the same dosing/amount  Please advise

## 2016-03-06 NOTE — Telephone Encounter (Signed)
She is on nifedipine CC already

## 2016-03-09 NOTE — Telephone Encounter (Signed)
I called the pharmacy to clarify the standoff with getting rx prescribed and the pharmacist states its not the name of medication it is the quantity that is the issue.

## 2016-03-10 ENCOUNTER — Telehealth: Payer: Self-pay

## 2016-03-10 MED ORDER — NIFEDIPINE ER 30 MG PO TB24: ORAL_TABLET | ORAL | Status: DC

## 2016-03-10 NOTE — Telephone Encounter (Signed)
Michele Page has received prior authorization for medication and is working on getting it approved through Insurance underwriter. Patient stated that this medication Nifidipine CC does not work anyway. Wants it changed to something different. Patient is out of medication and needs something today. Patient stated that she talked to you about a medication but have forgotten the name of it, it was 90mg . Patient is stressed that she is out of medication and worried she is going to have to go to the ER. Please Advise.

## 2016-03-10 NOTE — Telephone Encounter (Signed)
That was the ER nifidine we discussed. She needs to resume her current nifedipine and take as ordered. Refer to cardiology for labile HTN

## 2016-03-10 NOTE — Telephone Encounter (Signed)
Prior authorization was received for Nifedipine ER Osmotic Release 30 mg tablets. I initiated prior authorization via covermymeds.com. Keyword: [id number]. DOB: 05-21-1956. Last name: Michele Page.   Awaiting determination.   I have also completed the [facility] Prior Authorization request form and placed it in Dr. Vale Haven folder for review and signing.

## 2016-03-10 NOTE — Telephone Encounter (Signed)
LMOM to return call.

## 2016-03-10 NOTE — Telephone Encounter (Signed)
Patient notified and Rx faxed to pharmacy. Referral placed for Cardiologist.

## 2016-03-11 ENCOUNTER — Ambulatory Visit
Admission: RE | Admit: 2016-03-11 | Discharge: 2016-03-11 | Disposition: A | Discharge status: Home / Self Care | Payer: Commercial Managed Care - HMO | Source: Ambulatory Visit | Attending: Internal Medicine | Admitting: Internal Medicine

## 2016-03-11 ENCOUNTER — Other Ambulatory Visit: Payer: Self-pay | Admitting: Internal Medicine

## 2016-03-11 DIAGNOSIS — R928 Other abnormal and inconclusive findings on diagnostic imaging of breast: Secondary | ICD-10-CM

## 2016-03-11 DIAGNOSIS — N6011 Diffuse cystic mastopathy of right breast: Secondary | ICD-10-CM | POA: Diagnosis not present

## 2016-03-11 DIAGNOSIS — N63 Unspecified lump in breast: Secondary | ICD-10-CM | POA: Diagnosis not present

## 2016-03-11 DIAGNOSIS — N6320 Unspecified lump in the left breast, unspecified quadrant: Secondary | ICD-10-CM

## 2016-03-12 ENCOUNTER — Encounter: Payer: Self-pay | Admitting: Cardiovascular Disease

## 2016-03-12 ENCOUNTER — Ambulatory Visit (INDEPENDENT_AMBULATORY_CARE_PROVIDER_SITE_OTHER): Payer: Commercial Managed Care - HMO | Admitting: Cardiovascular Disease

## 2016-03-12 VITALS — BP 122/76 | HR 74 | Ht 63.0 in | Wt 204.6 lb

## 2016-03-12 DIAGNOSIS — I10 Essential (primary) hypertension: Secondary | ICD-10-CM

## 2016-03-12 DIAGNOSIS — E785 Hyperlipidemia, unspecified: Secondary | ICD-10-CM

## 2016-03-12 NOTE — Telephone Encounter (Signed)
Received fax from [facility] [phone] and Nifedipine ER 30mg  has been APPROVED through 03/12/2017

## 2016-03-12 NOTE — Progress Notes (Signed)
03/12/2016 Michele Page   1956-07-05  [id number]  Primary Physician Gildardo Cranker, DO Primary Cardiologist: Lorretta Harp MD Renae Gloss   HPI:  Michele Page is a 60 year old moderately overweight married African-American female mother of 3 children, grandmother of 18 grandchildren recently revealed relocated from [place] to [place] 2 years ago. She is retired from working in her own daycare She is a history of hypertension and hypokalemia. She's had a stroke 4 years ago leaving her with some motor and cognitive deficit deficits. She's never had a heart attack. She denies chest pain or shortness of breath. She is referred here for further management of her hypertension.   Current Outpatient Prescriptions  Medication Sig Dispense Refill  . aspirin EC 81 MG tablet Take 1 tablet (81 mg total) by mouth daily. 30 tablet 6  . ibuprofen (ADVIL,MOTRIN) 600 MG tablet Take 1 tablet (600 mg total) by mouth every 6 (six) hours as needed. 30 tablet 0  . labetalol (NORMODYNE) 300 MG tablet Take 2 tablets (600 mg total) by mouth 3 (three) times daily. 180 tablet 4  . losartan (COZAAR) 100 MG tablet Take one tablet by mouth once daily for blood pressure 90 tablet 1  . pravastatin (PRAVACHOL) 10 MG tablet TAKE ONE TABLET BY MOUTH ONCE DAILY AT BEDTIME 30 tablet 5   No current facility-administered medications for this visit.    No Known Allergies  Social History   Social History  . Marital Status: Married    Spouse Name: N/A  . Number of Children: 3  . Years of Education: N/A   Occupational History  . retired    Social History Main Topics  . Smoking status: Former Smoker -- 0.50 packs/day for 25 years    Types: Cigarettes    Quit date: 05/21/2002  . Smokeless tobacco: Never Used  . Alcohol Use: 0.0 oz/week    0 Standard drinks or equivalent per week     Comment: occas  . Drug Use: No  . Sexual Activity: No   Other Topics Concern  . Not on file   Social  History Narrative   Diet- N/A   Caffeine- Yes   Married- Yes   House- 2 story with 2 people   Pets- No   Current/past profession- Nurse, mental health   Exercise- Yes   Living will-No   DNR-N/A   POA/HPOA-No           Review of Systems: General: negative for chills, fever, night sweats or weight changes.  Cardiovascular: negative for chest pain, dyspnea on exertion, edema, orthopnea, palpitations, paroxysmal nocturnal dyspnea or shortness of breath Dermatological: negative for rash Respiratory: negative for cough or wheezing Urologic: negative for hematuria Abdominal: negative for nausea, vomiting, diarrhea, bright red blood per rectum, melena, or hematemesis Neurologic: negative for visual changes, syncope, or dizziness All other systems reviewed and are otherwise negative except as noted above.    Blood pressure 122/76, pulse 74, height 5\' 3"  (1.6 m), weight 204 lb 9.6 oz (92.806 kg).  General appearance: alert and no distress Neck: no adenopathy, no carotid bruit, no JVD, supple, symmetrical, trachea midline and thyroid not enlarged, symmetric, no tenderness/mass/nodules Lungs: clear to auscultation bilaterally Heart: regular rate and rhythm, S1, S2 normal, no murmur, click, rub or gallop Extremities: extremities normal, atraumatic, no cyanosis or edema  EKG not performed today  ASSESSMENT AND PLAN:   Essential hypertension History of hypertension on losartan and labetalol for blood pressure measured at 122/76.  I'm going to have her come back and see Erasmo Downer in one month to review a three-day blood pressure log and to reconcile her medication list.  Hyperlipidemia History of hyperlipidemia on pravastatin followed by her PCP      Lorretta Harp MD [facility], [facility] 03/12/2016 4:33 PM

## 2016-03-12 NOTE — Assessment & Plan Note (Signed)
History of hypertension on losartan and labetalol for blood pressure measured at 122/76. I'm going to have her come back and see Michele Page in one month to review a three-day blood pressure log and to reconcile her medication list.

## 2016-03-12 NOTE — Patient Instructions (Addendum)
Medication Instructions:  Your physician recommends that you continue on your current medications as directed. Please refer to the Current Medication list given to you today.   Labwork: none  Testing/Procedures: none  Follow-Up: Your physician recommends that you schedule a follow-up appointment in: 4 weeks - with PharmD   Your physician wants you to follow-up in: 12 months with Dr. Gwenlyn Found. You will receive a reminder letter in the mail two months in advance. If you don't receive a letter, please call our office to schedule the follow-up appointment.    Any Other Special Instructions Will Be Listed Below (If Applicable).   Dr. Gwenlyn Found would like you to check your blood pressure DAILY for the next 4 weeks.  Keep a journal of these daily BP and heart rate reading and call our office with the results.  BRING MEDICATION BOTTLES WITH YOU AND LIST OF ALL PROVIDERS AND PHARMACIES YOU USE.  If you need a refill on your cardiac medications before your next appointment, please call your pharmacy.

## 2016-03-12 NOTE — Assessment & Plan Note (Signed)
History of hyperlipidemia on pravastatin followed by her PCP 

## 2016-03-24 ENCOUNTER — Other Ambulatory Visit: Payer: Commercial Managed Care - HMO

## 2016-03-30 ENCOUNTER — Other Ambulatory Visit: Payer: Self-pay | Admitting: Internal Medicine

## 2016-03-30 DIAGNOSIS — N6320 Unspecified lump in the left breast, unspecified quadrant: Secondary | ICD-10-CM

## 2016-03-31 ENCOUNTER — Ambulatory Visit
Admission: RE | Admit: 2016-03-31 | Discharge: 2016-03-31 | Disposition: A | Discharge status: Home / Self Care | Payer: Commercial Managed Care - HMO | Source: Ambulatory Visit | Attending: Internal Medicine | Admitting: Internal Medicine

## 2016-03-31 DIAGNOSIS — C50412 Malignant neoplasm of upper-outer quadrant of left female breast: Secondary | ICD-10-CM | POA: Diagnosis not present

## 2016-03-31 DIAGNOSIS — N6320 Unspecified lump in the left breast, unspecified quadrant: Secondary | ICD-10-CM

## 2016-03-31 DIAGNOSIS — N63 Unspecified lump in breast: Secondary | ICD-10-CM | POA: Diagnosis not present

## 2016-03-31 DIAGNOSIS — N641 Fat necrosis of breast: Secondary | ICD-10-CM | POA: Diagnosis not present

## 2016-03-31 HISTORY — PX: BREAST BIOPSY: SHX20

## 2016-04-02 ENCOUNTER — Encounter: Payer: Self-pay | Admitting: *Deleted

## 2016-04-02 ENCOUNTER — Telehealth: Payer: Self-pay | Admitting: *Deleted

## 2016-04-02 DIAGNOSIS — C50412 Malignant neoplasm of upper-outer quadrant of left female breast: Secondary | ICD-10-CM | POA: Insufficient documentation

## 2016-04-02 HISTORY — DX: Malignant neoplasm of upper-outer quadrant of left female breast: C50.412

## 2016-04-02 NOTE — Telephone Encounter (Signed)
Confirmed [facility] for 04/08/16 at 0815 .  Instructions and contact information given.

## 2016-04-06 ENCOUNTER — Ambulatory Visit: Payer: Commercial Managed Care - HMO | Admitting: Internal Medicine

## 2016-04-08 ENCOUNTER — Ambulatory Visit: Payer: Commercial Managed Care - HMO | Attending: Surgery | Admitting: Physical Therapy

## 2016-04-08 ENCOUNTER — Ambulatory Visit: Payer: Commercial Managed Care - HMO | Admitting: Internal Medicine

## 2016-04-08 ENCOUNTER — Encounter: Payer: Self-pay | Admitting: *Deleted

## 2016-04-08 ENCOUNTER — Encounter: Payer: Self-pay | Admitting: Hematology and Oncology

## 2016-04-08 ENCOUNTER — Other Ambulatory Visit (HOSPITAL_BASED_OUTPATIENT_CLINIC_OR_DEPARTMENT_OTHER): Payer: Commercial Managed Care - HMO

## 2016-04-08 ENCOUNTER — Ambulatory Visit: Payer: Self-pay | Admitting: Surgery

## 2016-04-08 ENCOUNTER — Ambulatory Visit
Admission: RE | Admit: 2016-04-08 | Discharge: 2016-04-08 | Disposition: A | Discharge status: Home / Self Care | Payer: Commercial Managed Care - HMO | Source: Ambulatory Visit | Attending: Radiation Oncology | Admitting: Radiation Oncology

## 2016-04-08 ENCOUNTER — Encounter: Payer: Self-pay | Admitting: Physical Therapy

## 2016-04-08 ENCOUNTER — Ambulatory Visit (HOSPITAL_BASED_OUTPATIENT_CLINIC_OR_DEPARTMENT_OTHER): Payer: Commercial Managed Care - HMO | Admitting: Hematology and Oncology

## 2016-04-08 ENCOUNTER — Encounter: Payer: Self-pay | Admitting: Skilled Nursing Facility1

## 2016-04-08 VITALS — BP 137/79 | HR 73 | Temp 98.2°F | Resp 18 | Ht 63.0 in | Wt 203.9 lb

## 2016-04-08 DIAGNOSIS — R293 Abnormal posture: Secondary | ICD-10-CM

## 2016-04-08 DIAGNOSIS — M25612 Stiffness of left shoulder, not elsewhere classified: Secondary | ICD-10-CM | POA: Insufficient documentation

## 2016-04-08 DIAGNOSIS — C50412 Malignant neoplasm of upper-outer quadrant of left female breast: Secondary | ICD-10-CM

## 2016-04-08 DIAGNOSIS — Z170 Estrogen receptor positive status [ER+]: Secondary | ICD-10-CM | POA: Diagnosis not present

## 2016-04-08 DIAGNOSIS — C50912 Malignant neoplasm of unspecified site of left female breast: Secondary | ICD-10-CM | POA: Diagnosis not present

## 2016-04-08 LAB — CBC WITH DIFFERENTIAL/PLATELET
BASO%: 1.3 % (ref 0.0–2.0)
Basophils Absolute: 0.1 10*3/uL (ref 0.0–0.1)
EOS ABS: 0.1 10*3/uL (ref 0.0–0.5)
EOS%: 1.5 % (ref 0.0–7.0)
HCT: 38 % (ref 34.8–46.6)
HEMOGLOBIN: 12.6 g/dL (ref 11.6–15.9)
LYMPH%: 28.7 % (ref 14.0–49.7)
MCH: 31 pg (ref 25.1–34.0)
MCHC: 33.2 g/dL (ref 31.5–36.0)
MCV: 93.6 fL (ref 79.5–101.0)
MONO#: 0.3 10*3/uL (ref 0.1–0.9)
MONO%: 6.8 % (ref 0.0–14.0)
NEUT%: 61.7 % (ref 38.4–76.8)
NEUTROS ABS: 3.1 10*3/uL (ref 1.5–6.5)
PLATELETS: 271 10*3/uL (ref 145–400)
RBC: 4.06 10*6/uL (ref 3.70–5.45)
RDW: 13.4 % (ref 11.2–14.5)
WBC: 4.9 10*3/uL (ref 3.9–10.3)
lymph#: 1.4 10*3/uL (ref 0.9–3.3)

## 2016-04-08 LAB — COMPREHENSIVE METABOLIC PANEL
ALBUMIN: 4.3 g/dL (ref 3.5–5.0)
ALK PHOS: 90 U/L (ref 40–150)
ALT: 23 U/L (ref 0–55)
ANION GAP: 9 meq/L (ref 3–11)
AST: 25 U/L (ref 5–34)
BILIRUBIN TOTAL: 0.58 mg/dL (ref 0.20–1.20)
BUN: 16.3 mg/dL (ref 7.0–26.0)
CO2: 26 mEq/L (ref 22–29)
CREATININE: 1.5 mg/dL — AB (ref 0.6–1.1)
Calcium: 10 mg/dL (ref 8.4–10.4)
Chloride: 106 mEq/L (ref 98–109)
EGFR: 45 mL/min/{1.73_m2} — AB (ref >90)
GLUCOSE: 124 mg/dL (ref 70–140)
Potassium: 4.7 mEq/L (ref 3.5–5.1)
SODIUM: 141 meq/L (ref 136–145)
TOTAL PROTEIN: 7.6 g/dL (ref 6.4–8.3)

## 2016-04-08 NOTE — Progress Notes (Signed)
Subjective:     Patient ID: Michele Page, female   DOB: Aug 15, 1956, 60 y.o.   MRN: [id number]  HPI   Review of Systems     Objective:   Physical Exam For the patient to understand and be given the tools to implement a healthy plant based diet during their cancer diagnosis.     Assessment:     Patient was seen today and found to be jovial and accompanied y her daughter and husband. Pts medications losartan, pravastatin.  Pts ht 63'' wt 203 lbs, BMI 36.2. Pts labs: creatinine 1.5. Medical hx: hypertension, CVA, CKD. Pt states it is going to be hard but she can do it.      Plan:     Dietitian educated the patient on implementing a plant based diet by incorporating more plant proteins, fruits, and vegetables. As a part of a healthy routine physical activity was discussed. Dietitian offered many options for preparing meals. The importance of legitimate, evidence based information was discussed and examples were given. A folder of evidence based information with a focus on a plant based diet and general nutrition during cancer was given to the patient.  As a part of the continuum of care the cancer dietitian's contact information was given to the patient in the event they would like to have a follow up appointment.

## 2016-04-08 NOTE — Therapy (Signed)
[place], [place], [address] Phone: [phone]   Fax:  [phone]  Physical Therapy Evaluation  Patient Details  Name: Michele Page MRN: [id number] Date of Birth: 30-Nov-1955 Referring Provider: Dr. Erroll Luna  Encounter Date: 04/08/2016      PT End of Session - 04/08/16 1152    Visit Number 1   Number of Visits 1   PT Start Time 3220   PT Stop Time 2542  Also saw pt from 1115-1130 for a total of 25 minutes   PT Time Calculation (min) 10 min   Activity Tolerance Patient tolerated treatment well   Behavior During Therapy [facility] for tasks assessed/performed      Past Medical History  Diagnosis Date  . Stroke [facility])     2013  . Hypertension   . Multiple sclerosis ([place])   . Vision abnormalities   . Colon polyps   . COPD (chronic obstructive pulmonary disease) ([place])     per pt she does not have it  . Hyperlipidemia   . Breast cancer of upper-outer quadrant of left female breast ([place]) 04/02/2016  . Breast cancer [facility])     Past Surgical History  Procedure Laterality Date  . Vaginal delivery      x3  . Tubal ligation    . Hysteroscopy w/d&c N/A 12/19/2015    Procedure: DILATATION AND CURETTAGE /HYSTEROSCOPY;  Surgeon: Linda Hedges, DO;  Location: [facility];  Service: Gynecology;  Laterality: N/A;    There were no vitals filed for this visit.       Subjective Assessment - 04/08/16 1144    Subjective Patient reports she is here today to be seen by her medical team for her newly diagnosed left breast cancer.   Patient is accompained by: Family member   Pertinent History Patient was diagnosed on 03/30/16 with left invasive ductal carcinoma breast cancer.  There are 2 masses in her left breast.  One area measures 1.9 cm but is fat necrosis and the other measures 8 mm and is invasive cancer.  It is located in the upper outer quadrant, is ER/PR positive and HER2 negative with a Ki67 of 20%.   Patient  Stated Goals Reduce lymphedema risk and learn post op shoulder ROM HEP   Currently in Pain? No/denies            [facility] PT Assessment - 04/08/16 0001    Assessment   Medical Diagnosis Left breast cancer   Referring Provider Dr. Marcello Moores [name]   Onset Date/Surgical Date 03/30/16   Hand Dominance Right   Prior Therapy none   Precautions   Precautions Other (comment)   Precaution Comments Active breast cancer   Restrictions   Weight Bearing Restrictions No   Balance Screen   Has the patient fallen in the past 6 months Yes   How many times? 1  Tripped on a stair;not a balance issue   Has the patient had a decrease in activity level because of a fear of falling?  No   Is the patient reluctant to leave their home because of a fear of falling?  No   Home Environment   Living Environment Private residence   Living Arrangements Spouse/significant other   Available Help at Discharge Family   Prior Function   Level of [place] Retired   Leisure She goes to a gym 5x/week and does 1 hour of cardio each time   Cognition   Overall Cognitive  Status Within Functional Limits for tasks assessed   Posture/Postural Control   Posture/Postural Control Postural limitations   Postural Limitations Rounded Shoulders;Forward head   ROM / Strength   AROM / PROM / Strength AROM;Strength   AROM   AROM Assessment Site Shoulder   Right/Left Shoulder Right;Left   Right Shoulder Extension 40 Degrees   Right Shoulder Flexion 147 Degrees   Right Shoulder ABduction 151 Degrees   Right Shoulder Internal Rotation 70 Degrees   Right Shoulder External Rotation 67 Degrees   Left Shoulder Extension 35 Degrees   Left Shoulder Flexion 152 Degrees   Left Shoulder ABduction 124 Degrees  with c/o shoulder pain   Left Shoulder Internal Rotation 89 Degrees   Left Shoulder External Rotation 76 Degrees   Strength   Overall Strength Within functional limits for tasks performed            LYMPHEDEMA/ONCOLOGY QUESTIONNAIRE - 04/08/16 1151    Type   Cancer Type Left breast cancer   Lymphedema Assessments   Lymphedema Assessments Upper extremities   Right Upper Extremity Lymphedema   10 cm Proximal to Olecranon Process 29.4 cm   Olecranon Process 25.8 cm   10 cm Proximal to Ulnar Styloid Process 23.8 cm   Just Proximal to Ulnar Styloid Process 16 cm   Across Hand at [employer] 20.8 cm   At [place] of 2nd Digit 6.2 cm   Left Upper Extremity Lymphedema   10 cm Proximal to Olecranon Process 29.6 cm   Olecranon Process 25.6 cm   10 cm Proximal to Ulnar Styloid Process 13.4 cm   Just Proximal to Ulnar Styloid Process 16 cm   Across Hand at [employer] 20.4 cm   At [place] of 2nd Digit 6.3 cm        Patient was instructed today in a home exercise program today for post op shoulder range of motion. These included active assist shoulder flexion in sitting, scapular retraction, wall walking with shoulder abduction, and hands behind head external rotation.  She was encouraged to do these twice a day, holding 3 seconds and repeating 5 times when permitted by her physician.                   PT Education - 04/08/16 1152    Education provided Yes   Education Details Lymphedema risk reduction and post op shoulder ROM HEP   Person(s) Educated Patient;Spouse   Methods Explanation;Demonstration;Handout   Comprehension Returned demonstration;Verbalized understanding              Breast Clinic Goals - 04/08/16 1200    Patient will be able to verbalize understanding of pertinent lymphedema risk reduction practices relevant to her diagnosis specifically related to skin care.   Time 1   Period Days   Status Achieved   Patient will be able to return demonstrate and/or verbalize understanding of the post-op home exercise program related to regaining shoulder range of motion.   Time 1   Period Days   Status Achieved   Patient will be able to verbalize  understanding of the importance of attending the postoperative After Breast Cancer Class for further lymphedema risk reduction education and therapeutic exercise.   Time 1   Period Days   Status Achieved              Plan - 04/08/16 1153    Clinical Impression Statement Patient was diagnosed on 03/30/16 with left invasive ductal carcinoma breast cancer.  There  are 2 masses in her left breast.  One area measures 1.9 cm but is fat necrosis and the other measures 8 mm and is invasive cancer.  It is located in the upper outer quadrant, is ER/PR positive and HER2 negative with a Ki67 of 20%.  She is planning to have a left lumpectomy and sentinel node biopsy followed by Oncotype testing, radiation and anti-estrogen therapy.  She may benefit from post op PT to regain shoulder ROM and reduce lymphedema risk.  She has no personal factors or comorbidities so this eval is of low complexity.   Rehab Potential Excellent   Clinical Impairments Affecting Rehab Potential none   PT Frequency One time visit   PT Treatment/Interventions Therapeutic exercise;Patient/family education   PT Next Visit Plan Will f/u after surgery   PT Home Exercise Plan Post op shoulder ROM HEP   Consulted and Agree with Plan of Care Patient;Family member/caregiver   Family Member Consulted Husband and daughter      Patient will benefit from skilled therapeutic intervention in order to improve the following deficits and impairments:  Decreased strength, Pain, Impaired UE functional use, Decreased range of motion, Decreased knowledge of precautions  Visit Diagnosis: Carcinoma of upper-outer quadrant of left female breast ([place]) - Plan: PT plan of care cert/re-cert  Abnormal posture - Plan: PT plan of care cert/re-cert  Stiffness of left shoulder, not elsewhere classified - Plan: PT plan of care cert/re-cert      G-Codes - 72/53/66 1200    Functional Assessment Tool Used Clinical judgement   Functional Limitation Other  PT primary   Other PT Primary Current Status (Y4034) At least 1 percent but less than 20 percent impaired, limited or restricted   Other PT Primary Goal Status (V4259) At least 1 percent but less than 20 percent impaired, limited or restricted   Other PT Primary Discharge Status (D6387) At least 1 percent but less than 20 percent impaired, limited or restricted     Patient will follow up at outpatient cancer rehab if needed following surgery.  If the patient requires physical therapy at that time, a specific plan will be dictated and sent to the referring physician for approval. The patient was educated today on appropriate basic range of motion exercises to begin post operatively and the importance of attending the After Breast Cancer class following surgery.  Patient was educated today on lymphedema risk reduction practices as it pertains to recommendations that will benefit the patient immediately following surgery.  She verbalized good understanding.  No additional physical therapy is indicated at this time.     Problem List Patient Active Problem List   Diagnosis Date Noted  . Breast cancer of upper-outer quadrant of left female breast ([place]) 04/02/2016  . Essential hypertension 01/10/2016  . History of stroke with residual deficit 01/10/2016  . Gastroesophageal reflux disease without esophagitis 01/10/2016  . Hyperlipidemia 01/10/2016  . CKD (chronic kidney disease) 01/10/2016  . Multiple sclerosis ([place]) 03/20/2015  . Accelerated hypertension 03/20/2015  . CVA (cerebral infarction) 03/20/2015  . Hemiplegia following CVA (cerebrovascular accident) ([place]) 03/20/2015  . Cognitive disorder 03/20/2015  . Nocturia 03/20/2015  . Snoring 03/20/2015  . Aphasia S/P CVA 03/20/2015  . 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

## 2016-04-08 NOTE — Assessment & Plan Note (Signed)
Left breast biopsy 03/31/2069 Screening detected left breast mass, 2 nodules, 1.9 x 1.6 x 0.8 cm= fat necrosis; 8 x 7 x 7 mm= grade 2 IDC ER 100%, PR 100%, HER-2 negative ratio 1.39, Ki-67 20%  Clinical stage: T1b N0 stage IA  Pathology and radiology counseling:Discussed with the patient, the details of pathology including the type of breast cancer,the clinical staging, the significance of ER, PR and HER-2/neu receptors and the implications for treatment. After reviewing the pathology in detail, we proceeded to discuss the different treatment options between surgery, radiation, chemotherapy, antiestrogen therapies.  Recommendations: 1. Breast conserving surgery followed by 2. Oncotype DX testing to determine if chemotherapy would be of any benefit followed by 3. Adjuvant radiation therapy followed by 4. Adjuvant antiestrogen therapy  Oncotype counseling: I discussed Oncotype DX test. I explained to the patient that this is a 21 gene panel to evaluate patient tumors DNA to calculate recurrence score. This would help determine whether patient has high risk or intermediate risk or low risk breast cancer. She understands that if her tumor was found to be high risk, she would benefit from systemic chemotherapy. If low risk, no need of chemotherapy. If she was found to be intermediate risk, we would need to evaluate the score as well as other risk factors and determine if an abbreviated chemotherapy may be of benefit.  Return to clinic after surgery to discuss final pathology report and then determine if Oncotype DX testing will need to be sent.   

## 2016-04-08 NOTE — Progress Notes (Signed)
[name] NOTE  Patient Care Team: Gildardo Cranker, DO as PCP - General (Internal Medicine) Nicholas Lose, MD as Consulting Physician (Oncology) Erroll Luna, MD as Consulting Physician (General Surgery) Thea Silversmith, MD as Consulting Physician (Radiation Oncology)  CHIEF COMPLAINTS/PURPOSE OF CONSULTATION:  Newly diagnosed breast cancer  HISTORY OF PRESENTING ILLNESS:  Michele Page 60 y.o. female is here because of recent diagnosis of left breast cancer. Patient had screening mammogram that revealed bilateral breast abnormalities. The right breast abnormality was evaluated by ultrasound and was found to be benign cysts. The left breast had 2 nodules. 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 The slightly deeper nodule at 5 cm from the nipple measuring 1.9 cm is fat necrosis. Patient was presented this morning at the multidisciplinary tumor board and she is here today to discuss a treatment plan.  I reviewed her records extensively and collaborated the history with the patient.  SUMMARY OF ONCOLOGIC HISTORY:   Breast cancer of upper-outer quadrant of left female breast ([facility]   03/31/2016 Initial Diagnosis Screening detected left breast mass, 2 nodules, 1.9 x 1.6 x 0.8 cm= fat necrosis; 8 x 7 x 7 mm= grade 2 IDC ER 100%, PR 100%, HER-2 negative ratio 1.39, Ki-67 20%    In terms of breast cancer risk profile:  She menarched at early age of 89and went to menopause at age 60 She had 3 pregnancy, her first child was born at age 60 She has not received birth control pills.  She was never exposed to fertility medications or hormone replacement therapy.  She has no family history of Breast/GYN/GI cancer  MEDICAL HISTORY:  Past Medical History  Diagnosis Date  . Stroke ([facility]     2013  . Hypertension   . Multiple sclerosis ([facility]   . Vision  abnormalities   . Colon polyps   . COPD (chronic obstructive pulmonary disease) ([facility]     per pt she does not have it  . Hyperlipidemia   . Breast cancer of upper-outer quadrant of left female breast ([facility] 04/02/2016  . [facility] cancer ([facility]     SURGICAL HISTORY: Past Surgical History  Procedure Laterality Date  . Vaginal delivery      x3  . Tubal ligation    . Hysteroscopy w/d&c N/A 12/19/2015    Procedure: DILATATION AND CURETTAGE /HYSTEROSCOPY;  Surgeon: MLinda Hedges DO;  [facility];  Service: Gynecology;  Laterality: N/A;    SOCIAL HISTORY: Social History   Social History  . Marital Status: Married    Spouse Name: N/A  . Number of Children: 3  . Years of Education: N/A   Occupational History  . retired    Social History Main Topics  . Smoking status: Former Smoker -- 0.50 packs/day for 25 years    Types: Cigarettes    Quit date: 05/21/2002  . Smokeless tobacco: Never Used  . Alcohol Use: 0.0 oz/week    0 Standard drinks or equivalent per week     Comment: occas  . Drug Use: No  . Sexual Activity: No   Other Topics Concern  . Not on file   Social History Narrative   Diet- N/A   Caffeine- Yes   Married- Yes   House- 2 story with 2 people   Pets- No   Current/past profession- BNurse, mental health  Exercise- Yes  Living will-No   DNR-N/A   POA/HPOA-No          FAMILY HISTORY: Family History  Problem Relation Age of Onset  . Stroke Sister   . Alcohol abuse Brother   . HIV/AIDS Brother   . Hypertension Mother   . Stroke Mother     ALLERGIES:  has No Known Allergies.  MEDICATIONS:  Current Outpatient Prescriptions  Medication Sig Dispense Refill  . aspirin EC 81 MG tablet Take 1 tablet (81 mg total) by mouth daily. 30 tablet 6  . ibuprofen (ADVIL,MOTRIN) 600 MG tablet Take 1 tablet (600 mg total) by mouth every 6 (six) hours as needed. 30 tablet 0  . labetalol (NORMODYNE) 300 MG tablet Take 2 tablets (600 mg total) by mouth 3 (three) times  daily. 180 tablet 4  . losartan (COZAAR) 100 MG tablet Take one tablet by mouth once daily for blood pressure 90 tablet 1  . pravastatin (PRAVACHOL) 10 MG tablet TAKE ONE TABLET BY MOUTH ONCE DAILY AT BEDTIME 30 tablet 5  . NIFEdipine (PROCARDIA-XL/ADALAT-CC/NIFEDICAL-XL) 30 MG 24 hr tablet      No current facility-administered medications for this visit.    REVIEW OF SYSTEMS:   Constitutional: Denies fevers, chills or abnormal night sweats Eyes: Denies blurriness of vision, double vision or watery eyes Ears, nose, mouth, throat, and face: Denies mucositis or sore throat Respiratory: Denies cough, dyspnea or wheezes Cardiovascular: Denies palpitation, chest discomfort or lower extremity swelling Gastrointestinal:  Denies nausea, heartburn or change in bowel habits Skin: Denies abnormal skin rashes Lymphatics: Denies new lymphadenopathy or easy bruising Neurological:Denies numbness, tingling or new weaknesses Behavioral/Psych: Mood is stable, no new changes  Breast:  Denies any palpable lumps or discharge All other systems were reviewed with the patient and are negative.  PHYSICAL EXAMINATION: ECOG PERFORMANCE STATUS: 0 - Asymptomatic  Filed Vitals:   04/08/16 0921  BP: 137/79  Pulse: 73  Temp: 98.2 F (36.8 C)  Resp: 18   Filed Weights   04/08/16 0921  Weight: 203 lb 14.4 oz (92.488 kg)    GENERAL:alert, no distress and comfortable SKIN: skin color, texture, turgor are normal, no rashes or significant lesions EYES: normal, conjunctiva are pink and non-injected, sclera clear OROPHARYNX:no exudate, no erythema and lips, buccal mucosa, and tongue normal  NECK: supple, thyroid normal size, non-tender, without nodularity LYMPH:  no palpable lymphadenopathy in the cervical, axillary or inguinal LUNGS: clear to auscultation and percussion with normal breathing effort HEART: regular rate & rhythm and no murmurs and no lower extremity edema ABDOMEN:abdomen soft, non-tender and  normal bowel sounds Musculoskeletal:no cyanosis of digits and no clubbing  PSYCH: alert & oriented x 3 with fluent speech NEURO: no focal motor/sensory deficits BREAST: No palpable nodules in breast. No palpable axillary or supraclavicular lymphadenopathy (exam performed in the presence of a chaperone)   LABORATORY DATA:  I have reviewed the data as listed Lab Results  Component Value Date   WBC 4.9 04/08/2016   HGB 12.6 04/08/2016   HCT 38.0 04/08/2016   MCV 93.6 04/08/2016   PLT 271 04/08/2016   Lab Results  Component Value Date   NA 141 04/08/2016   K 4.7 04/08/2016   CL 104 12/18/2015   CO2 26 04/08/2016    RADIOGRAPHIC STUDIES: I have personally reviewed the radiological reports and agreed with the findings in the report.  ASSESSMENT AND PLAN:  Breast cancer of upper-outer quadrant of left female breast [facility]) Left breast biopsy 03/31/2069 Screening detected left breast  mass, 2 nodules, 1.9 x 1.6 x 0.8 cm= fat necrosis; 8 x 7 x 7 mm= grade 2 IDC ER 100%, PR 100%, HER-2 negative ratio 1.39, Ki-67 20%  Clinical stage: T1b N0 stage IA  Pathology and radiology counseling:Discussed with the patient, the details of pathology including the type of breast cancer,the clinical staging, the significance of ER, PR and HER-2/neu receptors and the implications for treatment. After reviewing the pathology in detail, we proceeded to discuss the different treatment options between surgery, radiation, chemotherapy, antiestrogen therapies.  Recommendations: 1. Breast conserving surgery followed by 2. Oncotype DX testing to determine if chemotherapy would be of any benefit followed by 3. Adjuvant radiation therapy followed by 4. Adjuvant antiestrogen therapy  Oncotype counseling: I discussed Oncotype DX test. I explained to the patient that this is a 21 gene panel to evaluate patient tumors DNA to calculate recurrence score. This would help determine whether patient has high risk or  intermediate risk or low risk breast cancer. She understands that if her tumor was found to be high risk, she would benefit from systemic chemotherapy. If low risk, no need of chemotherapy. If she was found to be intermediate risk, we would need to evaluate the score as well as other risk factors and determine if an abbreviated chemotherapy may be of benefit.  Return to clinic after surgery to discuss final pathology report and then determine if Oncotype DX testing will need to be sent.  All questions were answered. The patient knows to call the clinic with any problems, questions or concerns.    Rulon Eisenmenger, MD 04/08/2016

## 2016-04-08 NOTE — Patient Instructions (Signed)

## 2016-04-08 NOTE — H&P (Signed)
Michele Page 04/08/2016 7:58 AM Location: [facility] Patient #: [id number] DOB: Aug 14, 1956 Undefined / Language: Michele Page / Race: Black or African American Female  History of Present Illness Michele Page A. Michele Meuth MD; 04/08/2016 11:09 AM) Patient words: patient sent at the request of Dr. Lovey Page after undergoing mammogram with ultrasound. She was found to have 2 masses in her left breast in the upper outer quadrant one measuring 8 mm the other 1.9 cm within 1-3 cm of each other. 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 Patient denies history of any problems with either breast. She does have right breast cysts.She does have some issues with memory according to her husband.  The patient is a 60 year old female.   Other Problems Michele Pummel, RN; 04/08/2016 7:59 AM) Back Pain Breast Cancer Cerebrovascular Accident Chronic Obstructive Lung Disease Hemorrhoids High blood pressure Hypercholesterolemia  Past Surgical History Michele Pummel, RN; 04/08/2016 7:59 AM) Breast Biopsy Left.  Diagnostic Studies History Michele Pummel, RN; 04/08/2016 7:59 AM) Colonoscopy within last year Mammogram within last year Pap Smear 1-5 years ago  Medication History Michele Pummel, RN; 04/08/2016 7:59 AM) Medications Reconciled  Social History Michele Pummel, RN; 04/08/2016 7:59 AM) Alcohol use Remotely quit alcohol use. Caffeine use Coffee, Tea. No drug use  Family History Michele Pummel, RN; 04/08/2016 7:59 AM) Alcohol Abuse Brother, Sister. Cerebrovascular Accident Sister. Heart Disease Father. Hypertension Brother, Father, Mother, Sister.  Pregnancy / Birth History Michele Pummel, RN; 04/08/2016 7:59 AM) Age at menarche 32 years. Age of menopause 54-55 Gravida 3 Maternal age 11-20 Para 3 Regular periods     Review of Systems Michele Spillers Ledford RN;  04/08/2016 7:59 AM) General Present- Weight Gain. Not Present- Appetite Loss, Chills, Fatigue, Fever, Night Sweats and Weight Loss. Skin Not Present- Change in Wart/Mole, Dryness, Hives, Jaundice, New Lesions, Non-Healing Wounds, Rash and Ulcer. HEENT Present- Seasonal Allergies and Wears glasses/contact lenses. Not Present- Earache, Hearing Loss, Hoarseness, Nose Bleed, Oral Ulcers, Ringing in the Ears, Sinus Pain, Sore Throat, Visual Disturbances and Yellow Eyes. Breast Not Present- Breast Mass, Breast Pain, Nipple Discharge and Skin Changes. Cardiovascular Present- Rapid Heart Rate and Swelling of Extremities. Not Present- Chest Pain, Difficulty Breathing Lying Down, Leg Cramps, Palpitations and Shortness of Breath. Gastrointestinal Present- Hemorrhoids. Not Present- Abdominal Pain, Bloating, Bloody Stool, Change in Bowel Habits, Chronic diarrhea, Constipation, Difficulty Swallowing, Excessive gas, Gets full quickly at meals, Indigestion, Nausea, Rectal Pain and Vomiting. Female Genitourinary Present- Nocturia. Not Present- Frequency, Painful Urination, Pelvic Pain and Urgency. Musculoskeletal Present- Back Pain. Not Present- Joint Pain, Joint Stiffness, Muscle Pain, Muscle Weakness and Swelling of Extremities. Neurological Present- Trouble walking. Not Present- Decreased Memory, Fainting, Headaches, Numbness, Seizures, Tingling, Tremor and Weakness. Psychiatric Not Present- Anxiety, Bipolar, Change in Sleep Pattern, Depression, Fearful and Frequent crying. Endocrine Present- Hair Changes. Not Present- Cold Intolerance, Excessive Hunger, Heat Intolerance, Hot flashes and New Diabetes. Hematology Present- Easy Bruising. Not Present- Excessive bleeding, Gland problems, HIV and Persistent Infections.   Physical Exam (Michele Page A. Michele Whitacre MD; 04/08/2016 11:09 AM)  General Mental Status-Alert. General Appearance-Consistent with stated age. Hydration-Well hydrated. Voice-Normal.  Head and  Neck Head-normocephalic, atraumatic with no lesions or palpable masses. Trachea-midline. Thyroid Gland Characteristics - normal size and consistency.  Chest and Lung Exam Chest and lung exam reveals -quiet, even and easy respiratory effort with no use of accessory muscles and on auscultation, normal breath sounds, no  adventitious sounds and normal vocal resonance. Inspection Chest Wall - Normal. Back - normal.  Breast Breast - Left-Symmetric, Non Tender, No Biopsy scars, no Dimpling, No Inflammation, No Lumpectomy scars, No Mastectomy scars, No Peau d' Orange. Breast - Right-Symmetric, Non Tender, No Biopsy scars, no Dimpling, No Inflammation, No Lumpectomy scars, No Mastectomy scars, No Peau d' Orange. Breast Lump-No Palpable Breast Mass.  Cardiovascular Cardiovascular examination reveals -normal heart sounds, regular rate and rhythm with no murmurs and normal pedal pulses bilaterally.  Neurologic Neurologic evaluation reveals -alert and oriented x 3 with no impairment of recent or remote memory. Mental Status-Normal.  Musculoskeletal Normal Exam - Left-Upper Extremity Strength Normal and Lower Extremity Strength Normal. Normal Exam - Right-Upper Extremity Strength Normal and Lower Extremity Strength Normal.  Lymphatic Head & Neck  General Head & Neck Lymphatics: Bilateral - Description - Normal. Axillary  General Axillary Region: Bilateral - Description - Normal. Tenderness - Non Tender.    Assessment & Plan (Michele Page A. Michele Adee MD; 04/08/2016 11:10 AM)  BREAST CANCER, LEFT (C50.912) Impression: discussed options of breast conservation versus mastectomy and reconstruction. Patient has opted for left breast partial mastectomy with sentinel lymph node mapping. Risk of lumpectomy include bleeding, infection, seroma, more surgery, use of seed/wire, wound care, cosmetic deformity and the need for other treatments, death , blood clots, death. Pt agrees to  proceed. Risk of sentinel lymph node mapping include bleeding, infection, lymphedema, shoulder pain. stiffness, dye allergy. cosmetic deformity , blood clots, death, need for more surgery. Pt agres to proceed.  Current Plans You are being scheduled for surgery - Our schedulers will call you.  You should hear from our office's scheduling department within 5 working days about the location, date, and time of surgery. We try to make accommodations for patient's preferences in scheduling surgery, but sometimes the OR schedule or the surgeon's schedule prevents [place] from making those accommodations.  If you have not heard from our office [phone]) in 5 working days, call the office and ask for your surgeon's nurse.  If you have other questions about your diagnosis, plan, or surgery, call the office and ask for your surgeon's nurse.  Pt Education - [facility] Breast Cancer Information Given - [employer] "Breast Journey" Package We discussed the staging and pathophysiology of breast cancer. We discussed all of the different options for treatment for breast cancer including surgery, chemotherapy, radiation therapy, Herceptin, and antiestrogen therapy. We discussed a sentinel lymph node biopsy as she does not appear to having lymph node involvement right now. We discussed the performance of that with injection of radioactive tracer and blue dye. We discussed that she would have an incision underneath her axillary hairline. We discussed that there is a bout a 10-20% chance of having a positive node with a sentinel lymph node biopsy and we will await the permanent pathology to make any other first further decisions in terms of her treatment. One of these options might be to return to the operating room to perform an axillary lymph node dissection. We discussed about a 1-2% risk lifetime of chronic shoulder pain as well as lymphedema associated with a sentinel lymph node biopsy. We discussed the options for treatment of  the breast cancer which included lumpectomy versus a mastectomy. We discussed the performance of the lumpectomy with a wire placement. We discussed a 10-20% chance of a positive margin requiring reexcision in the operating room. We also discussed that she may need radiation therapy or antiestrogen therapy or both if she undergoes lumpectomy.  We discussed the mastectomy and the postoperative care for that as well. We discussed that there is no difference in her survival whether she undergoes lumpectomy with radiation therapy or antiestrogen therapy versus a mastectomy. There is a slight difference in the local recurrence rate being 3-5% with lumpectomy and about 1% with a mastectomy. We discussed the risks of operation including bleeding, infection, possible reoperation. She understands her further therapy will be based on what her stages at the time of her operation.  Pt Education - flb breast cancer surgery: discussed with patient and provided information. Pt Education - [facility] Breast Biopsy HCI: discussed with patient and provided information. Pt Education - ABC (After Breast Cancer) Class Info: discussed with patient and provided information.

## 2016-04-08 NOTE — Progress Notes (Signed)
Clinical Social Work [place] Psychosocial Distress Screening [place]  Patient completed distress screening protocol and scored a 0 on the Psychosocial Distress Thermometer which indicates mild distress. Clinical Social Worker met with patient and patients family in [facility] to assess for distress and other psychosocial needs. Patient stated after meeting with the treatment team and getting information she was confident in her treatment plan. CSW and patient discussed common feeling and emotions when being diagnosed with cancer, and the importance of support during treatment. CSW informed patient of the support team and support services at [facility]. CSW provided contact information and encouraged patient to call with any questions or concerns.   ONCBCN DISTRESS SCREENING 04/08/2016  Screening Type Initial Screening  Distress experienced in past week (1-10) 0  Spiritual/Religous concerns type Relating to [place], MSW, LCSW, OSW-C Clinical Social Worker [facility] [phone]

## 2016-04-08 NOTE — Progress Notes (Signed)
Radiation Oncology         [phone]) [phone] ________________________________  Initial Outpatient Consultation - Date: 04/08/2016   Name: Bret Stamour MRN: [id number]   DOB: 01/03/1956  REFERRING PHYSICIAN: Erroll Luna, MD  DIAGNOSIS AND STAGE: No matching staging information was found for the patient.  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   HISTORY OF PRESENT ILLNESS::Michele Page is a 60 y.o. female who presents today because of an abnormal mammogram 03/04/2016. She had a diagnostic mammogram and ultrasound of both breasts 03/11/2016, showing 2 slightly irregular masses in the upper-outer quadrant of the left breast and a mass in the inferior retroareolar right breast, approximately 5 mm. The ultrasound showed an irregular hypoechoic mass in the left breast at the 1 o'clock position, measuring 0.8 x 0.7 x 0.7 cm, a mixed-echogenicity area measuring 1.9 x 0.8 x 1.6 cm in the upper-outer quadrant of the left breast, and multiple benign cysts in the right breast. Left axillary ultrasound showed no abnormal lymph nodes. She had a biopsy 03/31/2016, revealing the left breast mass in the 1 o'clock position 4 CMFN is invasive and in situ ductal carcinoma, grade 2, ER (100%), PR (100%), Her2-neu negative, and Ki 67 (20%). The left breast mass in the 1 o'clock position 5 CMFN was fat necrosis. She is here today to discuss radiation options. She is accompanied by her husband and daughter.  She moved here from [place] a few years ago.  She has done well since her biopsy.   PREVIOUS RADIATION THERAPY: No  Past medical, social and family history were reviewed in the electronic chart. Review of symptoms was reviewed in the electronic chart. Medications were reviewed in the electronic chart.   PHYSICAL EXAM: There were no vitals filed for this visit.. .  Vitals with BMI 04/08/2016  Height _0   Weight 203 lbs 14 oz  BMI 22.2  Systolic 979  Diastolic 79    Pulse 73  Respirations 18   No palpable cervical, supraclavicular, or axillary adenopathy. Bruising and a palpable mass in the upper outer quadrant of the left breast near the nipple.  Gynecologic History  Age at first menstrual period? 10  Are you still having periods? No  If you no longer have periods: Have you used hormone replacement? No Obstetric History:  How many children have you carried to term? 3 Your age at first live birth? 67  Pregnant now or trying to get pregnant? No  Have you used birth control pills or hormone shots for contraception? No Health Maintenance:  Have you ever had a colonoscopy? Yes  Date of your last PAP smear? 03/30/2016   IMPRESSION: Michele Page is a 60 yo female with breast cancer of the upper-outer quadrant of the left female breast. She is a good candidate for a lumpectomy and sentinel lymph node biopsy, oncotype testing, external radiation, and aromatase inhibitor.   PLAN: I spoke to the patient today regarding her diagnosis and options for treatment. We discussed the equivalence in terms of survival and local failure between mastectomy and breast conservation. We discussed the role of radiation in decreasing local failures in patients who undergo lumpectomy. We discussed 6 weeks of treatment as an outpatient. We discussed the possible side effects including but not limited to skin redness, fatigue, permanent skin darkening, and breast swelling. I will follow up with her a month after surgery.   She may require prone treatment due to breast size.  I spent 40 minutes face to face with the patient and more than 50% of that time was spent in counseling and/or coordination of care.   ------------------------------------------------  Thea Silversmith, MD    This document serves as a record of services personally performed by Thea Silversmith, MD. It was created on her behalf by  Lendon Collar, a trained medical scribe. The creation of this record is  based on the scribe's personal observations and the provider's statements to them. This document has been checked and approved by the attending provider.

## 2016-04-09 ENCOUNTER — Ambulatory Visit (INDEPENDENT_AMBULATORY_CARE_PROVIDER_SITE_OTHER): Payer: Commercial Managed Care - HMO | Admitting: Pharmacist Clinician (PhC)/ Clinical Pharmacy Specialist

## 2016-04-09 ENCOUNTER — Encounter: Payer: Self-pay | Admitting: Pharmacist Clinician (PhC)/ Clinical Pharmacy Specialist

## 2016-04-09 VITALS — BP 160/88 | Ht 63.0 in | Wt 207.0 lb

## 2016-04-09 DIAGNOSIS — I10 Essential (primary) hypertension: Secondary | ICD-10-CM

## 2016-04-09 MED ORDER — NIFEDIPINE ER OSMOTIC RELEASE 60 MG PO TB24: 60.0000 mg | ORAL_TABLET | Freq: Two times a day (BID) | ORAL | Status: DC

## 2016-04-09 MED ORDER — LABETALOL HCL 300 MG PO TABS: 600.0000 mg | ORAL_TABLET | Freq: Three times a day (TID) | ORAL | Status: DC

## 2016-04-09 NOTE — Assessment & Plan Note (Signed)
While she states all home readings have been good, her pressure in the office today is 160/88.  Her current drug regimen consists of 11 tablets per day, including labetolol 2 tablets 3 times daily.  She has been taking nifedipine xl 60 mg twice daily using 30 mg tablets.  I will change that to 60 mg tablets today to ease her pill burden by 2 tablets.  She states compliance is not a problem, but I believe it would be easier with a simpler regimen.  For now I have asked that she take her BP at home 3-4 times per week.  She will come back in a month, bringing her BP log, home cuff and medications.  At that time I will consider switching her off the labetolol to carvedilol.

## 2016-04-09 NOTE — Progress Notes (Signed)
04/09/2016 Talli Kulesza [birth year] [id number]   HPI:  Michele Page is a 60 y.o. female patient of Dr Gwenlyn Found, with a PMH below who presents today for hypertension clinic evaluation.  Ms [name] was referred to Dr. Gwenlyn Found by her PCP for long term hypertensive issues.  She tells me that she has had high blood pressure since she was in her early 20's.  Because of a prior stroke she dose have some trouble recalling medication names and doses, but between her and the chart was able to determine current regimen.  She has had a stressful week, as she was recently diagnosed with breast cancer and spent 4-5 hours at cancer center yesterday getting treatment regimen finalized.    Cardiac Hx: stroke 4 years ago (age 31) with some residual expressive aphasia  Family Hx: mother died from stroke at 63, sister from stroke in her 59's; both believed to be related to ongoing hypertension; father died from MI at 69;  She has 5 brothers (2 still living), but none had/have cardiovascular issues  Social Hx: quit tobacco 15+ years ago; only rare alcohol, drinks 1 cup coffee per day at most  Diet: eats almost all meals at home, only small amounts of salt used.  Currently eating larger portions of vegetables, cut meats out and eats only chicken  Exercise: gym 5 days per week; stays about 2 hours doing both cardio and strength training  Home BP readings: average 130-140s per patient recall, none > [id number] systolic or > 90 diastolic; did not bring cuff to office  Current antihypertensive medications:   Labetolol 600 mg tid  Losartan 100 mg qd  Nifedipine XL 60 mg bid  Intolerances: none  Wt Readings from Last 3 Encounters:  04/09/16 207 lb (93.895 kg)  04/08/16 203 lb 14.4 oz (92.488 kg)  03/12/16 204 lb 9.6 oz (92.806 kg)   BP Readings from Last 3 Encounters:  04/09/16 160/88  04/08/16 137/79  03/12/16 122/76   Pulse Readings from Last 3 Encounters:  04/08/16 73  03/12/16 74  01/10/16 63    Current  Outpatient Prescriptions  Medication Sig Dispense Refill  . aspirin EC 81 MG tablet Take 1 tablet (81 mg total) by mouth daily. 30 tablet 6  . ibuprofen (ADVIL,MOTRIN) 600 MG tablet Take 1 tablet (600 mg total) by mouth every 6 (six) hours as needed. 30 tablet 0  . labetalol (NORMODYNE) 300 MG tablet Take 2 tablets (600 mg total) by mouth 3 (three) times daily. 180 tablet 4  . losartan (COZAAR) 100 MG tablet Take one tablet by mouth once daily for blood pressure 90 tablet 1  . NIFEdipine (PROCARDIA XL/ADALAT-CC) 60 MG 24 hr tablet Take 1 tablet (60 mg total) by mouth 2 (two) times daily. 60 tablet 3  . pravastatin (PRAVACHOL) 10 MG tablet TAKE ONE TABLET BY MOUTH ONCE DAILY AT BEDTIME 30 tablet 5   No current facility-administered medications for this visit.    No Known Allergies  Past Medical History  Diagnosis Date  . Stroke [facility])     2013  . Hypertension   . Multiple sclerosis ([place])   . Vision abnormalities   . Colon polyps   . COPD (chronic obstructive pulmonary disease) ([place])     per pt she does not have it  . Hyperlipidemia   . Breast cancer of upper-outer quadrant of left female breast ([place]) 04/02/2016  . Breast cancer ([place])     Blood pressure 160/88, height 5\' 3"  (1.6 m),  weight 207 lb (93.895 kg).    Tommy Medal PharmD CPP [facility]

## 2016-04-09 NOTE — Patient Instructions (Signed)
Return for a a follow up appointment in 1 month  Your blood pressure today is 160/88 (goal is < 150/90)  Check your blood pressure at home 3-4 days per week and keep record of the readings.  Take your BP meds as follows:  Losartan 100 mg  - one tablet once daily  Labetolol 300 mg - two tablets three times daily  Nifedipine XR 60 mg - 1 tablet twice daily  Bring all of your meds, your BP cuff and your record of home blood pressures to your next appointment.  Exercise as you're able, try to walk approximately 30 minutes per day.  Keep salt intake to a minimum, especially watch canned and prepared boxed foods.  Eat more fresh fruits and vegetables and fewer canned items.  Avoid eating in fast food restaurants.    HOW TO TAKE YOUR BLOOD PRESSURE: . Rest 5 minutes before taking your blood pressure. .  Don't smoke or drink caffeinated beverages for at least 30 minutes before. . Take your blood pressure before (not after) you eat. . Sit comfortably with your back supported and both feet on the floor (don't cross your legs). . Elevate your arm to heart level on a table or a desk. . Use the proper sized cuff. It should fit smoothly and snugly around your bare upper arm. There should be enough room to slip a fingertip under the cuff. The bottom edge of the cuff should be 1 inch above the crease of the elbow. . Ideally, take 3 measurements at one sitting and record the average.

## 2016-04-10 ENCOUNTER — Other Ambulatory Visit: Payer: Self-pay | Admitting: Surgery

## 2016-04-10 DIAGNOSIS — C50912 Malignant neoplasm of unspecified site of left female breast: Secondary | ICD-10-CM

## 2016-04-14 ENCOUNTER — Encounter (HOSPITAL_BASED_OUTPATIENT_CLINIC_OR_DEPARTMENT_OTHER): Payer: Self-pay | Admitting: *Deleted

## 2016-04-15 ENCOUNTER — Ambulatory Visit
Admission: RE | Admit: 2016-04-15 | Discharge: 2016-04-15 | Disposition: A | Discharge status: Home / Self Care | Payer: Commercial Managed Care - HMO | Source: Ambulatory Visit | Attending: Surgery | Admitting: Surgery

## 2016-04-15 ENCOUNTER — Encounter (HOSPITAL_BASED_OUTPATIENT_CLINIC_OR_DEPARTMENT_OTHER)
Admission: RE | Admit: 2016-04-15 | Discharge: 2016-04-15 | Disposition: A | Discharge status: Home / Self Care | Payer: Commercial Managed Care - HMO | Source: Ambulatory Visit | Attending: Surgery | Admitting: Surgery

## 2016-04-15 ENCOUNTER — Telehealth: Payer: Self-pay | Admitting: *Deleted

## 2016-04-15 DIAGNOSIS — C50912 Malignant neoplasm of unspecified site of left female breast: Secondary | ICD-10-CM

## 2016-04-15 DIAGNOSIS — J449 Chronic obstructive pulmonary disease, unspecified: Secondary | ICD-10-CM | POA: Diagnosis not present

## 2016-04-15 DIAGNOSIS — C50412 Malignant neoplasm of upper-outer quadrant of left female breast: Secondary | ICD-10-CM | POA: Diagnosis not present

## 2016-04-15 DIAGNOSIS — I10 Essential (primary) hypertension: Secondary | ICD-10-CM | POA: Diagnosis not present

## 2016-04-15 DIAGNOSIS — Z8673 Personal history of transient ischemic attack (TIA), and cerebral infarction without residual deficits: Secondary | ICD-10-CM | POA: Diagnosis not present

## 2016-04-15 DIAGNOSIS — Z87891 Personal history of nicotine dependence: Secondary | ICD-10-CM | POA: Diagnosis not present

## 2016-04-15 NOTE — Telephone Encounter (Signed)
  Oncology Nurse Navigator Documentation    Navigator Encounter Type: Telephone;MDC Follow-up (04/15/16 1100) Telephone: Outgoing Call;Clinic/MDC Follow-up (04/15/16 1100)     Surgery Date: 04/17/16 (04/15/16 1100) Treatment Initiated Date: 04/17/16 (04/15/16 1100)     Barriers/Navigation Needs: Coordination of Care;No Questions (04/15/16 1100)   Interventions: Coordination of Care (04/15/16 1100)   Coordination of Care: Appts (04/15/16 1100)  Scheduled and confirmed post op appt with Dr. Lindi Adie on 04/27/16.                Time Spent with Patient: 15 (04/15/16 1100)

## 2016-04-15 NOTE — Progress Notes (Signed)
Pt here for EKG. Also gave 8oz box orange flavored Boost per protocol. Gave pt written and verbal instructions to drink Boost DOS no later than 0630. Pt verbalized understanding.

## 2016-04-17 ENCOUNTER — Encounter (HOSPITAL_BASED_OUTPATIENT_CLINIC_OR_DEPARTMENT_OTHER): Payer: Self-pay | Admitting: Anesthesiology

## 2016-04-17 ENCOUNTER — Ambulatory Visit (HOSPITAL_BASED_OUTPATIENT_CLINIC_OR_DEPARTMENT_OTHER): Payer: Commercial Managed Care - HMO | Admitting: Anesthesiology

## 2016-04-17 ENCOUNTER — Encounter (HOSPITAL_BASED_OUTPATIENT_CLINIC_OR_DEPARTMENT_OTHER)
Admission: RE | Disposition: A | Discharge status: Home / Self Care | Payer: Self-pay | Source: Ambulatory Visit | Attending: Surgery

## 2016-04-17 ENCOUNTER — Encounter (HOSPITAL_COMMUNITY)
Admission: RE | Admit: 2016-04-17 | Discharge: 2016-04-17 | Disposition: A | Discharge status: Home / Self Care | Payer: Commercial Managed Care - HMO | Source: Ambulatory Visit | Attending: Surgery | Admitting: Surgery

## 2016-04-17 ENCOUNTER — Ambulatory Visit (HOSPITAL_BASED_OUTPATIENT_CLINIC_OR_DEPARTMENT_OTHER)
Admission: RE | Admit: 2016-04-17 | Discharge: 2016-04-17 | Disposition: A | Discharge status: Home / Self Care | Payer: Commercial Managed Care - HMO | Source: Ambulatory Visit | Attending: Surgery | Admitting: Surgery

## 2016-04-17 ENCOUNTER — Ambulatory Visit
Admission: RE | Admit: 2016-04-17 | Discharge: 2016-04-17 | Disposition: A | Discharge status: Home / Self Care | Payer: Commercial Managed Care - HMO | Source: Ambulatory Visit | Attending: Surgery | Admitting: Surgery

## 2016-04-17 DIAGNOSIS — I10 Essential (primary) hypertension: Secondary | ICD-10-CM | POA: Insufficient documentation

## 2016-04-17 DIAGNOSIS — C50912 Malignant neoplasm of unspecified site of left female breast: Secondary | ICD-10-CM | POA: Insufficient documentation

## 2016-04-17 DIAGNOSIS — Z87891 Personal history of nicotine dependence: Secondary | ICD-10-CM | POA: Diagnosis not present

## 2016-04-17 DIAGNOSIS — Z8673 Personal history of transient ischemic attack (TIA), and cerebral infarction without residual deficits: Secondary | ICD-10-CM | POA: Diagnosis not present

## 2016-04-17 DIAGNOSIS — C50412 Malignant neoplasm of upper-outer quadrant of left female breast: Secondary | ICD-10-CM | POA: Insufficient documentation

## 2016-04-17 DIAGNOSIS — R079 Chest pain, unspecified: Secondary | ICD-10-CM | POA: Diagnosis not present

## 2016-04-17 DIAGNOSIS — R928 Other abnormal and inconclusive findings on diagnostic imaging of breast: Secondary | ICD-10-CM | POA: Diagnosis not present

## 2016-04-17 DIAGNOSIS — J449 Chronic obstructive pulmonary disease, unspecified: Secondary | ICD-10-CM | POA: Insufficient documentation

## 2016-04-17 DIAGNOSIS — G8918 Other acute postprocedural pain: Secondary | ICD-10-CM | POA: Diagnosis not present

## 2016-04-17 HISTORY — PX: RADIOACTIVE SEED GUIDED PARTIAL MASTECTOMY WITH AXILLARY SENTINEL LYMPH NODE BIOPSY: SHX6520

## 2016-04-17 HISTORY — PX: BREAST LUMPECTOMY: SHX2

## 2016-04-17 HISTORY — DX: Unspecified osteoarthritis, unspecified site: M19.90

## 2016-04-17 SURGERY — RADIOACTIVE SEED GUIDED PARTIAL MASTECTOMY WITH AXILLARY SENTINEL LYMPH NODE BIOPSY
Anesthesia: General | Site: Breast | Laterality: Left

## 2016-04-17 MED ORDER — DEXTROSE 5 % IV SOLN
3.0000 g | INTRAVENOUS | Status: AC
  Administered 2016-04-17: 2 g via INTRAVENOUS

## 2016-04-17 MED ORDER — DEXAMETHASONE SODIUM PHOSPHATE 4 MG/ML IJ SOLN
INTRAMUSCULAR | Status: DC | PRN
  Administered 2016-04-17: 10 mg via INTRAVENOUS

## 2016-04-17 MED ORDER — HYDROMORPHONE HCL 1 MG/ML IJ SOLN
INTRAMUSCULAR | Status: AC
  Filled 2016-04-17: qty 1

## 2016-04-17 MED ORDER — CHLORHEXIDINE GLUCONATE 4 % EX LIQD: 1.0000 "application " | Freq: Once | CUTANEOUS | Status: DC

## 2016-04-17 MED ORDER — PROPOFOL 10 MG/ML IV BOLUS
INTRAVENOUS | Status: AC
  Filled 2016-04-17: qty 20

## 2016-04-17 MED ORDER — MIDAZOLAM HCL 5 MG/5ML IJ SOLN
INTRAMUSCULAR | Status: DC | PRN
  Administered 2016-04-17: 2 mg via INTRAVENOUS

## 2016-04-17 MED ORDER — 0.9 % SODIUM CHLORIDE (POUR BTL) OPTIME
TOPICAL | Status: DC | PRN
  Administered 2016-04-17: 1000 mL

## 2016-04-17 MED ORDER — ONDANSETRON HCL 4 MG/2ML IJ SOLN
INTRAMUSCULAR | Status: AC
  Filled 2016-04-17: qty 2

## 2016-04-17 MED ORDER — SODIUM CHLORIDE 0.9 % IJ SOLN
INTRAVENOUS | Status: DC | PRN
  Administered 2016-04-17: 5 mL

## 2016-04-17 MED ORDER — HYDROMORPHONE HCL 1 MG/ML IJ SOLN
0.2500 mg | INTRAMUSCULAR | Status: DC | PRN
  Administered 2016-04-17 (×3): 0.5 mg via INTRAVENOUS

## 2016-04-17 MED ORDER — OXYCODONE-ACETAMINOPHEN 5-325 MG PO TABS
ORAL_TABLET | ORAL | Status: AC
  Filled 2016-04-17: qty 1

## 2016-04-17 MED ORDER — CEFAZOLIN SODIUM-DEXTROSE 2-4 GM/100ML-% IV SOLN
INTRAVENOUS | Status: AC
  Filled 2016-04-17: qty 100

## 2016-04-17 MED ORDER — LACTATED RINGERS IV SOLN
INTRAVENOUS | Status: DC
  Administered 2016-04-17 (×2): via INTRAVENOUS

## 2016-04-17 MED ORDER — MIDAZOLAM HCL 2 MG/2ML IJ SOLN
INTRAMUSCULAR | Status: AC
  Filled 2016-04-17: qty 2

## 2016-04-17 MED ORDER — BUPIVACAINE-EPINEPHRINE (PF) 0.25% -1:200000 IJ SOLN
INTRAMUSCULAR | Status: DC | PRN
  Administered 2016-04-17: 17 mL via PERINEURAL

## 2016-04-17 MED ORDER — FENTANYL CITRATE (PF) 100 MCG/2ML IJ SOLN
INTRAMUSCULAR | Status: DC | PRN
  Administered 2016-04-17 (×2): 50 ug via INTRAVENOUS
  Administered 2016-04-17: 25 ug via INTRAVENOUS

## 2016-04-17 MED ORDER — LIDOCAINE HCL (CARDIAC) 20 MG/ML IV SOLN
INTRAVENOUS | Status: DC | PRN
  Administered 2016-04-17: 50 mg via INTRAVENOUS

## 2016-04-17 MED ORDER — BUPIVACAINE HCL (PF) 0.5 % IJ SOLN
INTRAMUSCULAR | Status: DC | PRN
  Administered 2016-04-17: 30 mL via PERINEURAL

## 2016-04-17 MED ORDER — TRAMADOL HCL 50 MG PO TABS: 50.0000 mg | ORAL_TABLET | Freq: Four times a day (QID) | ORAL | Status: DC | PRN

## 2016-04-17 MED ORDER — FENTANYL CITRATE (PF) 100 MCG/2ML IJ SOLN
INTRAMUSCULAR | Status: AC
  Filled 2016-04-17: qty 2

## 2016-04-17 MED ORDER — LIDOCAINE 2% (20 MG/ML) 5 ML SYRINGE
INTRAMUSCULAR | Status: AC
Start: 2016-04-17 — End: 2016-04-17
  Filled 2016-04-17: qty 5

## 2016-04-17 MED ORDER — PROMETHAZINE HCL 25 MG/ML IJ SOLN: 6.2500 mg | INTRAMUSCULAR | Status: DC | PRN

## 2016-04-17 MED ORDER — GLYCOPYRROLATE 0.2 MG/ML IJ SOLN: 0.2000 mg | Freq: Once | INTRAMUSCULAR | Status: DC | PRN

## 2016-04-17 MED ORDER — SCOPOLAMINE 1 MG/3DAYS TD PT72: 1.0000 | MEDICATED_PATCH | Freq: Once | TRANSDERMAL | Status: DC | PRN

## 2016-04-17 MED ORDER — DEXAMETHASONE SODIUM PHOSPHATE 10 MG/ML IJ SOLN
INTRAMUSCULAR | Status: AC
  Filled 2016-04-17: qty 1

## 2016-04-17 MED ORDER — MIDAZOLAM HCL 2 MG/2ML IJ SOLN
1.0000 mg | INTRAMUSCULAR | Status: DC | PRN
  Administered 2016-04-17: 2 mg via INTRAVENOUS

## 2016-04-17 MED ORDER — OXYCODONE-ACETAMINOPHEN 5-325 MG PO TABS: 1.0000 | ORAL_TABLET | ORAL | Status: DC | PRN

## 2016-04-17 MED ORDER — FENTANYL CITRATE (PF) 100 MCG/2ML IJ SOLN
50.0000 ug | INTRAMUSCULAR | Status: DC | PRN
  Administered 2016-04-17 (×2): 50 ug via INTRAVENOUS

## 2016-04-17 MED ORDER — OXYCODONE-ACETAMINOPHEN 5-325 MG PO TABS
1.0000 | ORAL_TABLET | Freq: Once | ORAL | Status: AC
Start: 2016-04-17 — End: 2016-04-17
  Administered 2016-04-17: 1 via ORAL

## 2016-04-17 MED ORDER — TECHNETIUM TC 99M SULFUR COLLOID FILTERED
1.0000 | Freq: Once | INTRAVENOUS | Status: AC | PRN
  Administered 2016-04-17: 1 via INTRADERMAL

## 2016-04-17 MED ORDER — ONDANSETRON HCL 4 MG/2ML IJ SOLN
INTRAMUSCULAR | Status: DC | PRN
  Administered 2016-04-17: 4 mg via INTRAVENOUS

## 2016-04-17 SURGICAL SUPPLY — 45 items
APPLIER CLIP 9.375 MED OPEN (MISCELLANEOUS) ×2
BINDER BREAST LRG (GAUZE/BANDAGES/DRESSINGS) IMPLANT
BINDER BREAST MEDIUM (GAUZE/BANDAGES/DRESSINGS) IMPLANT
BINDER BREAST XLRG (GAUZE/BANDAGES/DRESSINGS) IMPLANT
BINDER BREAST XXLRG (GAUZE/BANDAGES/DRESSINGS) IMPLANT
BLADE SURG 15 STRL LF DISP TIS (BLADE) ×1 IMPLANT
BLADE SURG 15 STRL SS (BLADE) ×1
CANISTER SUC SOCK COL 7IN (MISCELLANEOUS) IMPLANT
CANISTER SUCT 1200ML W/VALVE (MISCELLANEOUS) ×2 IMPLANT
CHLORAPREP W/TINT 26ML (MISCELLANEOUS) ×2 IMPLANT
CLIP APPLIE 9.375 MED OPEN (MISCELLANEOUS) ×1 IMPLANT
COVER BACK TABLE 60X90IN (DRAPES) ×2 IMPLANT
COVER MAYO STAND STRL (DRAPES) ×2 IMPLANT
COVER PROBE W GEL 5X96 (DRAPES) ×2 IMPLANT
DECANTER SPIKE VIAL GLASS SM (MISCELLANEOUS) IMPLANT
DEVICE DUBIN W/COMP PLATE 8390 (MISCELLANEOUS) ×2 IMPLANT
DRAPE LAPAROSCOPIC ABDOMINAL (DRAPES) IMPLANT
DRAPE LAPAROTOMY 100X72 PEDS (DRAPES) ×2 IMPLANT
DRAPE UTILITY XL STRL (DRAPES) ×2 IMPLANT
ELECT COATED BLADE 2.86 ST (ELECTRODE) ×2 IMPLANT
ELECT REM PT RETURN 9FT ADLT (ELECTROSURGICAL) ×2
ELECTRODE REM PT RTRN 9FT ADLT (ELECTROSURGICAL) ×1 IMPLANT
GLOVE BIOGEL PI IND STRL 8 (GLOVE) ×1 IMPLANT
GLOVE BIOGEL PI INDICATOR 8 (GLOVE) ×1
GLOVE ECLIPSE 8.0 STRL XLNG CF (GLOVE) ×2 IMPLANT
GOWN STRL REUS W/ TWL LRG LVL3 (GOWN DISPOSABLE) ×2 IMPLANT
GOWN STRL REUS W/TWL LRG LVL3 (GOWN DISPOSABLE) ×2
KIT MARKER MARGIN INK (KITS) ×2 IMPLANT
LIQUID BAND (GAUZE/BANDAGES/DRESSINGS) ×4 IMPLANT
NEEDLE HYPO 25X1 1.5 SAFETY (NEEDLE) ×2 IMPLANT
NS IRRIG 1000ML POUR BTL (IV SOLUTION) ×2 IMPLANT
PACK BASIN DAY SURGERY FS (CUSTOM PROCEDURE TRAY) ×2 IMPLANT
PENCIL BUTTON HOLSTER BLD 10FT (ELECTRODE) ×2 IMPLANT
PIN SAFETY STERILE (MISCELLANEOUS) IMPLANT
SLEEVE SCD COMPRESS KNEE MED (MISCELLANEOUS) ×2 IMPLANT
SPONGE LAP 4X18 X RAY DECT (DISPOSABLE) ×2 IMPLANT
STAPLER VISISTAT 35W (STAPLE) IMPLANT
SUT MNCRL AB 4-0 PS2 18 (SUTURE) ×2 IMPLANT
SUT SILK 2 0 SH (SUTURE) IMPLANT
SUT VICRYL 3-0 CR8 SH (SUTURE) ×2 IMPLANT
SYR CONTROL 10ML LL (SYRINGE) ×2 IMPLANT
TOWEL OR 17X24 6PK STRL BLUE (TOWEL DISPOSABLE) ×2 IMPLANT
TOWEL OR NON WOVEN STRL DISP B (DISPOSABLE) ×2 IMPLANT
TUBE CONNECTING 20X1/4 (TUBING) IMPLANT
YANKAUER SUCT BULB TIP NO VENT (SUCTIONS) IMPLANT

## 2016-04-17 NOTE — Anesthesia Postprocedure Evaluation (Signed)
Anesthesia Post Note  Patient: Michele Page  Procedure(s) Performed: Procedure(s) (LRB): RADIOACTIVE SEED GUIDED PARTIAL MASTECTOMY WITH AXILLARY SENTINEL LYMPH NODE BIOPSY AND AXILLARY LYMPH NODE DISSECTION (Left)  Patient location during evaluation: PACU Anesthesia Type: General and Regional Level of consciousness: awake and alert Pain management: pain level controlled Vital Signs Assessment: post-procedure vital signs reviewed and stable Respiratory status: spontaneous breathing, nonlabored ventilation, respiratory function stable and patient connected to nasal cannula oxygen Cardiovascular status: blood pressure returned to baseline and stable Postop Assessment: no signs of nausea or vomiting Anesthetic complications: no    Last Vitals:  Filed Vitals:   04/17/16 1120 04/17/16 1130  BP: 149/86 158/85  Pulse: 76 70  Temp: 36.5 C   Resp: 16 19    Last Pain:  Filed Vitals:   04/17/16 1136  PainSc: 7                  Charisma Charlot S

## 2016-04-17 NOTE — Anesthesia Preprocedure Evaluation (Signed)
Anesthesia Evaluation  Patient identified by MRN, date of birth, ID band Patient awake    Reviewed: Allergy & Precautions, NPO status , Patient's Chart, lab work & pertinent test results  Airway Mallampati: II  TM Distance: >3 FB Neck ROM: Full    Dental no notable dental hx.    Pulmonary neg pulmonary ROS, COPD, former smoker,    Pulmonary exam normal breath sounds clear to auscultation       Cardiovascular hypertension, Pt. on medications Normal cardiovascular exam Rhythm:Regular Rate:Normal     Neuro/Psych MS CVA, Residual Symptoms negative psych ROS   GI/Hepatic negative GI ROS, Neg liver ROS,   Endo/Other  negative endocrine ROS  Renal/GU negative Renal ROS  negative genitourinary   Musculoskeletal negative musculoskeletal ROS (+)   Abdominal   Peds negative pediatric ROS (+)  Hematology negative hematology ROS (+)   Anesthesia Other Findings   Reproductive/Obstetrics negative OB ROS                             Anesthesia Physical Anesthesia Plan  ASA: III  Anesthesia Plan: General   Post-op Pain Management: GA combined w/ Regional for post-op pain   Induction: Intravenous  Airway Management Planned: LMA  Additional Equipment:   Intra-op Plan:   Post-operative Plan: Extubation in OR  Informed Consent: I have reviewed the patients History and Physical, chart, labs and discussed the procedure including the risks, benefits and alternatives for the proposed anesthesia with the patient or authorized representative who has indicated his/her understanding and acceptance.   Dental advisory given  Plan Discussed with: CRNA and Surgeon  Anesthesia Plan Comments:         Anesthesia Quick Evaluation

## 2016-04-17 NOTE — Anesthesia Procedure Notes (Addendum)
Anesthesia Regional Block:  Pectoralis block  Pre-Anesthetic Checklist: ,, timeout performed, Correct Patient, Correct Site, Correct Laterality, Correct Procedure, Correct Position, site marked, Risks and benefits discussed,  Surgical consent,  Pre-op evaluation,  At surgeon's request and post-op pain management  Laterality: Left  Prep: chloraprep       Needles:  Injection technique: Single-shot  Needle Type: Echogenic Needle     Needle Length: 9cm 9 cm Needle Gauge: 21 and 21 G    Additional Needles:  Procedures: ultrasound guided (picture in chart) Pectoralis block Narrative:  Injection made incrementally with aspirations every 5 mL.  Performed by: Personally  Anesthesiologist: ROSE, GEORGE  Additional Notes: Patient tolerated the procedure well without complications   Procedure Name: LMA Insertion Date/Time: 04/17/2016 10:15 AM Performed by: Toula Moos L Pre-anesthesia Checklist: Patient identified, Emergency Drugs available, Suction available, Patient being monitored and Timeout performed Patient Re-evaluated:Patient Re-evaluated prior to inductionOxygen Delivery Method: Circle system utilized Preoxygenation: Pre-oxygenation with 100% oxygen Intubation Type: IV induction Ventilation: Mask ventilation without difficulty LMA: LMA inserted LMA Size: 4.0 Number of attempts: 1 Airway Equipment and Method: Bite block Placement Confirmation: positive ETCO2 Tube secured with: Tape Dental Injury: Teeth and Oropharynx as per pre-operative assessment

## 2016-04-17 NOTE — Transfer of Care (Signed)
Immediate Anesthesia Transfer of Care Note  Patient: Michele Page  Procedure(s) Performed: Procedure(s) with comments: RADIOACTIVE SEED GUIDED PARTIAL MASTECTOMY WITH AXILLARY SENTINEL LYMPH NODE BIOPSY AND AXILLARY LYMPH NODE DISSECTION (Left) - RADIOACTIVE SEED GUIDED PARTIAL MASTECTOMY WITH AXILLARY SENTINEL LYMPH NODE BIOPSY AND AXILLARY LYMPH NODE DISSECTION  Patient Location: PACU  Anesthesia Type:GA combined with regional for post-op pain  Level of Consciousness: awake and patient cooperative  Airway & Oxygen Therapy: Patient Spontanous Breathing and Patient connected to face mask oxygen  Post-op Assessment: Report given to RN and Post -op Vital signs reviewed and stable  Post vital signs: Reviewed and stable  Last Vitals:  Filed Vitals:   04/17/16 0950 04/17/16 1120  BP: 134/90   Pulse: 65 76  Temp:    Resp: 14     Last Pain: There were no vitals filed for this visit.       Complications: No apparent anesthesia complications

## 2016-04-17 NOTE — Progress Notes (Signed)
Assisted Dr. Rose with left, ultrasound guided, pectoralis block. Side rails up, monitors on throughout procedure. See vital signs in flow sheet. Tolerated Procedure well. 

## 2016-04-17 NOTE — Progress Notes (Signed)
Nuc med staff performed injection. Sedation given, pt tol well, see flowsheet for VS

## 2016-04-17 NOTE — H&P (View-Only) (Signed)
Michele Page 04/08/2016 7:58 AM Location: [facility] Patient #: [id number] DOB: 10/28/1956 Undefined / Language: English / Race: Black or African American Female  History of Present Illness (Brandy Kabat A. Jung Yurchak MD; 04/08/2016 11:09 AM) Patient words: patient sent at the request of Dr. Drew Davis after undergoing mammogram with ultrasound. She was found to have 2 masses in her left breast in the upper outer quadrant one measuring 8 mm the other 1.9 cm within 1-3 cm of each other. 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 Patient denies history of any problems with either breast. She does have right breast cysts.She does have some issues with memory according to her husband.  The patient is a 59 year old female.   Other Problems (Sylvia Ledford, RN; 04/08/2016 7:59 AM) Back Pain Breast Cancer Cerebrovascular Accident Chronic Obstructive Lung Disease Hemorrhoids High blood pressure Hypercholesterolemia  Past Surgical History (Sylvia Ledford, RN; 04/08/2016 7:59 AM) Breast Biopsy Left.  Diagnostic Studies History (Sylvia Ledford, RN; 04/08/2016 7:59 AM) Colonoscopy within last year Mammogram within last year Pap Smear 1-5 years ago  Medication History (Sylvia Ledford, RN; 04/08/2016 7:59 AM) Medications Reconciled  Social History (Sylvia Ledford, RN; 04/08/2016 7:59 AM) Alcohol use Remotely quit alcohol use. Caffeine use Coffee, Tea. No drug use  Family History (Sylvia Ledford, RN; 04/08/2016 7:59 AM) Alcohol Abuse Brother, Sister. Cerebrovascular Accident Sister. Heart Disease Father. Hypertension Brother, Father, Mother, Sister.  Pregnancy / Birth History (Sylvia Ledford, RN; 04/08/2016 7:59 AM) Age at menarche 10 years. Age of menopause 51-55 Gravida 3 Maternal age 15-20 Para 3 Regular periods     Review of Systems (Sylvia Ledford RN;  04/08/2016 7:59 AM) General Present- Weight Gain. Not Present- Appetite Loss, Chills, Fatigue, Fever, Night Sweats and Weight Loss. Skin Not Present- Change in Wart/Mole, Dryness, Hives, Jaundice, New Lesions, Non-Healing Wounds, Rash and Ulcer. HEENT Present- Seasonal Allergies and Wears glasses/contact lenses. Not Present- Earache, Hearing Loss, Hoarseness, Nose Bleed, Oral Ulcers, Ringing in the Ears, Sinus Pain, Sore Throat, Visual Disturbances and Yellow Eyes. Breast Not Present- Breast Mass, Breast Pain, Nipple Discharge and Skin Changes. Cardiovascular Present- Rapid Heart Rate and Swelling of Extremities. Not Present- Chest Pain, Difficulty Breathing Lying Down, Leg Cramps, Palpitations and Shortness of Breath. Gastrointestinal Present- Hemorrhoids. Not Present- Abdominal Pain, Bloating, Bloody Stool, Change in Bowel Habits, Chronic diarrhea, Constipation, Difficulty Swallowing, Excessive gas, Gets full quickly at meals, Indigestion, Nausea, Rectal Pain and Vomiting. Female Genitourinary Present- Nocturia. Not Present- Frequency, Painful Urination, Pelvic Pain and Urgency. Musculoskeletal Present- Back Pain. Not Present- Joint Pain, Joint Stiffness, Muscle Pain, Muscle Weakness and Swelling of Extremities. Neurological Present- Trouble walking. Not Present- Decreased Memory, Fainting, Headaches, Numbness, Seizures, Tingling, Tremor and Weakness. Psychiatric Not Present- Anxiety, Bipolar, Change in Sleep Pattern, Depression, Fearful and Frequent crying. Endocrine Present- Hair Changes. Not Present- Cold Intolerance, Excessive Hunger, Heat Intolerance, Hot flashes and New Diabetes. Hematology Present- Easy Bruising. Not Present- Excessive bleeding, Gland problems, HIV and Persistent Infections.   Physical Exam (Alvie Speltz A. Elnora Quizon MD; 04/08/2016 11:09 AM)  General Mental Status-Alert. General Appearance-Consistent with stated age. Hydration-Well hydrated. Voice-Normal.  Head and  Neck Head-normocephalic, atraumatic with no lesions or palpable masses. Trachea-midline. Thyroid Gland Characteristics - normal size and consistency.  Chest and Lung Exam Chest and lung exam reveals -quiet, even and easy respiratory effort with no use of accessory muscles and on auscultation, normal breath sounds, no   adventitious sounds and normal vocal resonance. Inspection Chest Wall - Normal. Back - normal.  Breast Breast - Left-Symmetric, Non Tender, No Biopsy scars, no Dimpling, No Inflammation, No Lumpectomy scars, No Mastectomy scars, No Peau d' Orange. Breast - Right-Symmetric, Non Tender, No Biopsy scars, no Dimpling, No Inflammation, No Lumpectomy scars, No Mastectomy scars, No Peau d' Orange. Breast Lump-No Palpable Breast Mass.  Cardiovascular Cardiovascular examination reveals -normal heart sounds, regular rate and rhythm with no murmurs and normal pedal pulses bilaterally.  Neurologic Neurologic evaluation reveals -alert and oriented x 3 with no impairment of recent or remote memory. Mental Status-Normal.  Musculoskeletal Normal Exam - Left-Upper Extremity Strength Normal and Lower Extremity Strength Normal. Normal Exam - Right-Upper Extremity Strength Normal and Lower Extremity Strength Normal.  Lymphatic Head & Neck  General Head & Neck Lymphatics: Bilateral - Description - Normal. Axillary  General Axillary Region: Bilateral - Description - Normal. Tenderness - Non Tender.    Assessment & Plan (Marisella Puccio A. Esaias Cleavenger MD; 04/08/2016 11:10 AM)  BREAST CANCER, LEFT (C50.912) Impression: discussed options of breast conservation versus mastectomy and reconstruction. Patient has opted for left breast partial mastectomy with sentinel lymph node mapping. Risk of lumpectomy include bleeding, infection, seroma, more surgery, use of seed/wire, wound care, cosmetic deformity and the need for other treatments, death , blood clots, death. Pt agrees to  proceed. Risk of sentinel lymph node mapping include bleeding, infection, lymphedema, shoulder pain. stiffness, dye allergy. cosmetic deformity , blood clots, death, need for more surgery. Pt agres to proceed.  Current Plans You are being scheduled for surgery - Our schedulers will call you.  You should hear from our office's scheduling department within 5 working days about the location, date, and time of surgery. We try to make accommodations for patient's preferences in scheduling surgery, but sometimes the OR schedule or the surgeon's schedule prevents us from making those accommodations.  If you have not heard from our office ([phone]) in 5 working days, call the office and ask for your surgeon's nurse.  If you have other questions about your diagnosis, plan, or surgery, call the office and ask for your surgeon's nurse.  Pt Education - [facility] Breast Cancer Information Given - [facility] "Breast Journey" Package We discussed the staging and pathophysiology of breast cancer. We discussed all of the different options for treatment for breast cancer including surgery, chemotherapy, radiation therapy, Herceptin, and antiestrogen therapy. We discussed a sentinel lymph node biopsy as she does not appear to having lymph node involvement right now. We discussed the performance of that with injection of radioactive tracer and blue dye. We discussed that she would have an incision underneath her axillary hairline. We discussed that there is a bout a 10-20% chance of having a positive node with a sentinel lymph node biopsy and we will await the permanent pathology to make any other first further decisions in terms of her treatment. One of these options might be to return to the operating room to perform an axillary lymph node dissection. We discussed about a 1-2% risk lifetime of chronic shoulder pain as well as lymphedema associated with a sentinel lymph node biopsy. We discussed the options for treatment of  the breast cancer which included lumpectomy versus a mastectomy. We discussed the performance of the lumpectomy with a wire placement. We discussed a 10-20% chance of a positive margin requiring reexcision in the operating room. We also discussed that she may need radiation therapy or antiestrogen therapy or both if she undergoes lumpectomy.   We discussed the mastectomy and the postoperative care for that as well. We discussed that there is no difference in her survival whether she undergoes lumpectomy with radiation therapy or antiestrogen therapy versus a mastectomy. There is a slight difference in the local recurrence rate being 3-5% with lumpectomy and about 1% with a mastectomy. We discussed the risks of operation including bleeding, infection, possible reoperation. She understands her further therapy will be based on what her stages at the time of her operation.  Pt Education - [facility] breast cancer surgery: discussed with patient and provided information. Pt Education - [facility] Breast Biopsy HCI: discussed with patient and provided information. Pt Education - ABC (After Breast Cancer) Class Info: discussed with patient and provided information. 

## 2016-04-17 NOTE — Discharge Instructions (Signed)
[facility] °Office Phone Number [phone] ° °BREAST BIOPSY/ PARTIAL MASTECTOMY: POST OP INSTRUCTIONS ° °Always review your discharge instruction sheet given to you by the facility where your surgery was performed. ° °IF YOU HAVE DISABILITY OR FAMILY LEAVE FORMS, YOU MUST BRING THEM TO THE OFFICE FOR PROCESSING.  DO NOT GIVE THEM TO YOUR DOCTOR. ° °1. A prescription for pain medication may be given to you upon discharge.  Take your pain medication as prescribed, if needed.  If narcotic pain medicine is not needed, then you may take acetaminophen (Tylenol) or ibuprofen (Advil) as needed. °2. Take your usually prescribed medications unless otherwise directed °3. If you need a refill on your pain medication, please contact your pharmacy.  They will contact our office to request authorization.  Prescriptions will not be filled after 5pm or on week-ends. °4. You should eat very light the first 24 hours after surgery, such as soup, crackers, pudding, etc.  Resume your normal diet the day after surgery. °5. Most patients will experience some swelling and bruising in the breast.  Ice packs and a good support bra will help.  Swelling and bruising can take several days to resolve.  °6. It is common to experience some constipation if taking pain medication after surgery.  Increasing fluid intake and taking a stool softener will usually help or prevent this problem from occurring.  A mild laxative (Milk of Magnesia or Miralax) should be taken according to package directions if there are no bowel movements after 48 hours. °7. Unless discharge instructions indicate otherwise, you may remove your bandages 24-48 hours after surgery, and you may shower at that time.  You may have steri-strips (small skin tapes) in place directly over the incision.  These strips should be left on the skin for 7-10 days.  If your surgeon used skin glue on the incision, you may shower in 24 hours.  The glue will flake off over the  next 2-3 weeks.  Any sutures or staples will be removed at the office during your follow-up visit. °8. ACTIVITIES:  You may resume regular daily activities (gradually increasing) beginning the next day.  Wearing a good support bra or sports bra minimizes pain and swelling.  You may have sexual intercourse when it is comfortable. °a. You may drive when you no longer are taking prescription pain medication, you can comfortably wear a seatbelt, and you can safely maneuver your car and apply brakes. °b. RETURN TO WORK:  ______________________________________________________________________________________ °9. You should see your doctor in the office for a follow-up appointment approximately two weeks after your surgery.  Your doctor’s nurse will typically make your follow-up appointment when she calls you with your pathology report.  Expect your pathology report 2-3 business days after your surgery.  You may call to check if you do not hear from us after three days. °10. OTHER INSTRUCTIONS: _______________________________________________________________________________________________ _____________________________________________________________________________________________________________________________________ °_____________________________________________________________________________________________________________________________________ °_____________________________________________________________________________________________________________________________________ ° °WHEN TO CALL YOUR DOCTOR: °1. Fever over 101.0 °2. Nausea and/or vomiting. °3. Extreme swelling or bruising. °4. Continued bleeding from incision. °5. Increased pain, redness, or drainage from the incision. ° °The clinic staff is available to answer your questions during regular business hours.  Please don’t hesitate to call and ask to speak to one of the nurses for clinical concerns.  If you have a medical emergency, go to the nearest  emergency room or call 911.  A surgeon from [facility] is always on call at the hospital. ° °For further questions, please visit [email or web address]  ° ° ° °  Post Anesthesia Home Care Instructions ° °Activity: °Get plenty of rest for the remainder of the day. A responsible adult should stay with you for 24 hours following the procedure.  °For the next 24 hours, DO NOT: °-Drive a car °-Operate machinery °-Drink alcoholic beverages °-Take any medication unless instructed by your physician °-Make any legal decisions or sign important papers. ° °Meals: °Start with liquid foods such as gelatin or soup. Progress to regular foods as tolerated. Avoid greasy, spicy, heavy foods. If nausea and/or vomiting occur, drink only clear liquids until the nausea and/or vomiting subsides. Call your physician if vomiting continues. ° °Special Instructions/Symptoms: °Your throat may feel dry or sore from the anesthesia or the breathing tube placed in your throat during surgery. If this causes discomfort, gargle with warm salt water. The discomfort should disappear within 24 hours. ° °If you had a scopolamine patch placed behind your ear for the management of post- operative nausea and/or vomiting: ° °1. The medication in the patch is effective for 72 hours, after which it should be removed.  Wrap patch in a tissue and discard in the trash. Wash hands thoroughly with soap and water. °2. You may remove the patch earlier than 72 hours if you experience unpleasant side effects which may include dry mouth, dizziness or visual disturbances. °3. Avoid touching the patch. Wash your hands with soap and water after contact with the patch. °  ° °

## 2016-04-17 NOTE — Op Note (Signed)
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  Anesthesia: Gen. with pectoral block and 0.25% Sensorcaine local with epinephrine  EBL: Minimal  Specimen: Left breast mass with seed and 2 biopsy clips in the specimen radiograph. 3 left axillary sentinel nodes blue and hot  Drains: None  Indications for procedure: The patient presents with stage I left breast cancer. She was seen in consultation and we discussed breast conservation versus ostectomy with reconstruction. We discussed the need for sentinel lymph node mapping. The pros and cons of each approach were discussed thoroughly as well potential complications. She opted for breast conservation.The procedure has been discussed with the patient. Alternatives to surgery have been discussed with the patient.  Risks of surgery include bleeding,  Infection,  Seroma formation, death,  and the need for further surgery.   The patient understands and wishes to proceed.Sentinel lymph node mapping and dissection has been discussed with the patient.  Risk of bleeding,  Infection,  Seroma formation,  Additional procedures,,  Shoulder weakness ,  Arm swelling, Shoulder stiffness,  Nerve and blood vessel injury and reaction to the mapping dyes have been discussed.  Alternatives to surgery have been discussed with the patient.  The patient agrees to proceed.  Description of procedure: The patient was met in the holding area and questions are answered. The left breast was marked as the correct side and seed location was verified using the neoprobe. She underwent injection by nuclear medicine and had a pectoral block placed by anesthesia. The procedure was reviewed with the patient and family at the bedside. The patient was taken back to the operating  room and placed supine on the OR table. After induction of general anesthesia, the left breast was prepped and draped in a sterile fashion and timeout was done. The patient received preoperative antibiotics. 4 mL of methylene blue dye were injected in a subareolar position. A neoprobe was used to identify the hot spot in the left breast upper outer quadrant. A curvilinear incision was made along the lateral border of the nipple complex. The neoprobe was used to verify seed location and all tissue around seed was excised. Margins were grossly negative. Radiograph revealed both biopsy clips and the seed to be present in the specimen. The cavity was irrigated and made hemostatic with cautery. The cavity was clipped. Incision closed with 3-0 Vicryl and 4-0 Monocryl.  The neoprobe was then turned to the left axilla. The technetium settings were used. A hotspot was identified in the left axilla. A 3 cm incision was made in the left axilla and dissection was carried down into the level I axillary contents. 3 hot and blue nodes were identified and removed and background counts approached 0. The cavity was irrigated and made hemostatic. The cavity was closed with 3-0 Vicryl and 4-0 Monocryl. Liquid adhesive applied. Breast binder placed. All final counts are found to be correct. The patient was awoke extubated taken to recovery in satisfactory condition.

## 2016-04-17 NOTE — Interval H&P Note (Signed)
History and Physical Interval Note:  04/17/2016 9:56 AM  Early Chars  has presented today for surgery, with the diagnosis of LEFT BREAST CANCER  The various methods of treatment have been discussed with the patient and family. After consideration of risks, benefits and other options for treatment, the patient has consented to  Procedure(s): RADIOACTIVE SEED GUIDED PARTIAL MASTECTOMY WITH AXILLARY SENTINEL LYMPH NODE BIOPSY AND AXILLARY LYMPH NODE DISSECTION (Left) as a surgical intervention .  The patient's history has been reviewed, patient examined, no change in status, stable for surgery.  I have reviewed the patient's chart and labs.  Questions were answered to the patient's satisfaction.     Joeann Steppe A.

## 2016-04-20 ENCOUNTER — Encounter (HOSPITAL_BASED_OUTPATIENT_CLINIC_OR_DEPARTMENT_OTHER): Payer: Self-pay | Admitting: Surgery

## 2016-04-23 ENCOUNTER — Telehealth: Payer: Self-pay

## 2016-04-23 ENCOUNTER — Other Ambulatory Visit: Payer: Self-pay | Admitting: Internal Medicine

## 2016-04-23 NOTE — Telephone Encounter (Signed)
Patient called to request refill on her Labetalol 300 mg, I informed her that she still had 4 refills on this medication. She said she would contact her pharmacy.

## 2016-04-27 ENCOUNTER — Telehealth: Payer: Self-pay | Admitting: *Deleted

## 2016-04-27 ENCOUNTER — Ambulatory Visit (HOSPITAL_BASED_OUTPATIENT_CLINIC_OR_DEPARTMENT_OTHER): Payer: Commercial Managed Care - HMO | Admitting: Hematology and Oncology

## 2016-04-27 ENCOUNTER — Encounter: Payer: Self-pay | Admitting: Hematology and Oncology

## 2016-04-27 VITALS — BP 123/75 | HR 82 | Temp 98.6°F | Resp 18 | Ht 63.0 in | Wt 203.2 lb

## 2016-04-27 DIAGNOSIS — C50412 Malignant neoplasm of upper-outer quadrant of left female breast: Secondary | ICD-10-CM | POA: Diagnosis not present

## 2016-04-27 NOTE — Telephone Encounter (Signed)
Received order per Dr. Lindi Adie for Oncotype Testing. Requisition sent to pathology. Received by Varney Biles

## 2016-04-27 NOTE — Assessment & Plan Note (Addendum)
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, 1.3 cm, with DCIS, margins negative, 0/4 lymph nodes negative, T1 cN0 stage IA pathologic stage, ER 100%, PR 100%, HER-2 negative ratio 1.39, Ki-67 20% (Originally 2 nodules were detected on screening mammogram 1.9 cm= fat necrosis and 8 mm IDC)  Pathology counseling: I discussed the final pathology report of the patient provided  a copy of this report. I discussed the margins as well as lymph node surgeries. We also discussed the final staging along with previously performed ER/PR and HER-2/neu testing.  Treatment plan: 1. Oncotype DX testing to determine if chemotherapy would be of any benefit followed by 2. Adjuvant radiation therapy followed by 3. Adjuvant antiestrogen therapy  Return to clinic based on Oncotype DX score

## 2016-04-27 NOTE — Progress Notes (Signed)
Patient Care Team: Gildardo Cranker, DO as PCP - General (Internal Medicine) Nicholas Lose, MD as Consulting Physician (Oncology) Erroll Luna, MD as Consulting Physician (General Surgery) Thea Silversmith, MD as Consulting Physician (Radiation Oncology)  DIAGNOSIS: Breast cancer of upper-outer quadrant of left female breast [facility])   Staging form: Breast, AJCC 7th Edition     Clinical stage from 04/08/2016: Stage IA (T1b, N0, M0) - Unsigned       Staging comments: Staged at breast conference on 5.24.17  SUMMARY OF ONCOLOGIC HISTORY:   Breast cancer of upper-outer quadrant of left female breast ([place])   03/31/2016 Initial Diagnosis Screening detected left breast mass, 2 nodules, 1.9 x 1.6 x 0.8 cm= fat necrosis; 8 x 7 x 7 mm= grade 2 IDC ER 100%, PR 100%, HER-2 negative ratio 1.39, Ki-67 20%   04/17/2016 Surgery Left lumpectomy: IDC grade 2, 1.3 cm, with DCIS, margins negative, 0/4 lymph nodes negative, T1 cN0 stage IA pathologic stage, ER 100%, PR 100%, HER-2 negative ratio 1.39, Ki-67 20%    CHIEF COMPLIANT: Follow-up after recent lumpectomy  INTERVAL HISTORY: Michele Page is a 60 year old with above-mentioned history of left breast cancer status post lumpectomy and is here to discuss the pathology report. She is healing very well from the surgery. Does not take any pain medications. Mild discomfort underneath the left arm.  REVIEW OF SYSTEMS:   Constitutional: Denies fevers, chills or abnormal weight loss Eyes: Denies blurriness of vision Ears, nose, mouth, throat, and face: Denies mucositis or sore throat Respiratory: Denies cough, dyspnea or wheezes Cardiovascular: Denies palpitation, chest discomfort Gastrointestinal:  Denies nausea, heartburn or change in bowel habits Skin: Denies abnormal skin rashes Lymphatics: Denies new lymphadenopathy or easy bruising Neurological:Denies numbness, tingling or new weaknesses Behavioral/Psych: Mood is stable, no new changes  Extremities: No lower  extremity edema Breast: Recent lumpectomy All other systems were reviewed with the patient and are negative.  I have reviewed the past medical history, past surgical history, social history and family history with the patient and they are unchanged from previous note.  ALLERGIES:  has No Known Allergies.  MEDICATIONS:  Current Outpatient Prescriptions  Medication Sig Dispense Refill  . aspirin EC 81 MG tablet Take 1 tablet (81 mg total) by mouth daily. 30 tablet 6  . ibuprofen (ADVIL,MOTRIN) 600 MG tablet Take 1 tablet (600 mg total) by mouth every 6 (six) hours as needed. 30 tablet 0  . labetalol (NORMODYNE) 300 MG tablet Take 2 tablets (600 mg total) by mouth 3 (three) times daily. 180 tablet 4  . losartan (COZAAR) 100 MG tablet TAKE ONE TABLET BY MOUTH ONCE DAILY FOR BLOOD PRESSURE 90 tablet 0  . NIFEdipine (PROCARDIA XL/ADALAT-CC) 60 MG 24 hr tablet Take 1 tablet (60 mg total) by mouth 2 (two) times daily. 60 tablet 3  . oxyCODONE-acetaminophen (ROXICET) 5-325 MG tablet Take 1 tablet by mouth every 4 (four) hours as needed. 30 tablet 0  . pravastatin (PRAVACHOL) 10 MG tablet TAKE ONE TABLET BY MOUTH AT BEDTIME 30 tablet 0  . traMADol (ULTRAM) 50 MG tablet Take 1 tablet (50 mg total) by mouth every 6 (six) hours as needed. 30 tablet 0   No current facility-administered medications for this visit.    PHYSICAL EXAMINATION: ECOG PERFORMANCE STATUS: 1 - Symptomatic but completely ambulatory  Filed Vitals:   04/27/16 0846  BP: 123/75  Pulse: 82  Temp: 98.6 F (37 C)  Resp: 18   Filed Weights   04/27/16 0846  Weight: 203  lb 3.2 oz (92.171 kg)    GENERAL:alert, no distress and comfortable SKIN: skin color, texture, turgor are normal, no rashes or significant lesions EYES: normal, Conjunctiva are pink and non-injected, sclera clear OROPHARYNX:no exudate, no erythema and lips, buccal mucosa, and tongue normal  NECK: supple, thyroid normal size, non-tender, without  nodularity LYMPH:  no palpable lymphadenopathy in the cervical, axillary or inguinal LUNGS: clear to auscultation and percussion with normal breathing effort HEART: regular rate & rhythm and no murmurs and no lower extremity edema ABDOMEN:abdomen soft, non-tender and normal bowel sounds MUSCULOSKELETAL:no cyanosis of digits and no clubbing  NEURO: alert & oriented x 3 with fluent speech, no focal motor/sensory deficits EXTREMITIES: No lower extremity edema  LABORATORY DATA:  I have reviewed the data as listed   Chemistry      Component Value Date/Time   NA 141 04/08/2016 0904   NA 140 12/18/2015 1125   NA 140 08/07/2015 1137   K 4.7 04/08/2016 0904   K 3.9 12/18/2015 1125   CL 104 12/18/2015 1125   CO2 26 04/08/2016 0904   CO2 28 12/18/2015 1125   BUN 16.3 04/08/2016 0904   BUN 21* 12/18/2015 1125   BUN 16 08/07/2015 1137   CREATININE 1.5* 04/08/2016 0904   CREATININE 1.47* 12/18/2015 1125      Component Value Date/Time   CALCIUM 10.0 04/08/2016 0904   CALCIUM 9.4 12/18/2015 1125   ALKPHOS 90 04/08/2016 0904   ALKPHOS 79 01/23/2015 1014   AST 25 04/08/2016 0904   AST 27 01/23/2015 1014   ALT 23 04/08/2016 0904   ALT 26 01/10/2016 1017   BILITOT 0.58 04/08/2016 0904   BILITOT 0.5 01/23/2015 1014   BILITOT 0.4 08/19/2014 1325       Lab Results  Component Value Date   WBC 4.9 04/08/2016   HGB 12.6 04/08/2016   HCT 38.0 04/08/2016   MCV 93.6 04/08/2016   PLT 271 04/08/2016   NEUTROABS 3.1 04/08/2016     ASSESSMENT & PLAN:  Breast cancer of upper-outer quadrant of left female breast ([place]) 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, 1.3 cm, with DCIS, margins negative, 0/4 lymph nodes negative, T1 cN0 stage IA pathologic stage, ER 100%, PR 100%, HER-2 negative ratio 1.39, Ki-67 20% (Originally 2 nodules were detected on screening mammogram 1.9 cm= fat necrosis and 8 mm IDC)  Pathology counseling: I discussed the final pathology report of the patient provided  a copy  of this report. I discussed the margins as well as lymph node surgeries. We also discussed the final staging along with previously performed ER/PR and HER-2/neu testing.  Treatment plan: 1. Oncotype DX testing to determine if chemotherapy would be of any benefit followed by 2. Adjuvant radiation therapy followed by 3. Adjuvant antiestrogen therapy  Return to clinic based on Oncotype DX score    No orders of the defined types were placed in this encounter.   The patient has a good understanding of the overall plan. she agrees with it. she will call with any problems that may develop before the next visit here.   Rulon Eisenmenger, MD 04/27/2016

## 2016-05-04 DIAGNOSIS — C50412 Malignant neoplasm of upper-outer quadrant of left female breast: Secondary | ICD-10-CM | POA: Diagnosis not present

## 2016-05-07 ENCOUNTER — Telehealth: Payer: Self-pay | Admitting: *Deleted

## 2016-05-07 ENCOUNTER — Encounter: Payer: Self-pay | Admitting: *Deleted

## 2016-05-07 NOTE — Progress Notes (Signed)
Mrs. Raisanen called had questions about when she would start her radiation treatment.  Told I would refer her to the scheduling department and she would receive a call back.  I also let her know that I would speak with Dr. Geralyn Flash office and his nurse would give her a call that they were waiting on a test result to come back.  She is okay with this plan.

## 2016-05-07 NOTE — Telephone Encounter (Signed)
Received Oncotype Dx result of 20/13%.  Placed a copy in Dr. Geralyn Flash box and took one to HIM to scan.

## 2016-05-07 NOTE — Telephone Encounter (Signed)
Spoke with patient and discussed oncotype results with her.  Per Dr. Lindi Adie no chemo.  I have referred her to XRT.

## 2016-05-13 ENCOUNTER — Encounter: Payer: Self-pay | Admitting: Internal Medicine

## 2016-05-13 ENCOUNTER — Ambulatory Visit (INDEPENDENT_AMBULATORY_CARE_PROVIDER_SITE_OTHER): Payer: Commercial Managed Care - HMO | Admitting: Internal Medicine

## 2016-05-13 VITALS — BP 144/88 | HR 69 | Temp 98.5°F | Ht 63.0 in | Wt 202.4 lb

## 2016-05-13 DIAGNOSIS — G4700 Insomnia, unspecified: Secondary | ICD-10-CM | POA: Diagnosis not present

## 2016-05-13 DIAGNOSIS — G35 Multiple sclerosis: Secondary | ICD-10-CM

## 2016-05-13 DIAGNOSIS — I10 Essential (primary) hypertension: Secondary | ICD-10-CM

## 2016-05-13 DIAGNOSIS — I6930 Unspecified sequelae of cerebral infarction: Secondary | ICD-10-CM | POA: Diagnosis not present

## 2016-05-13 DIAGNOSIS — E785 Hyperlipidemia, unspecified: Secondary | ICD-10-CM

## 2016-05-13 DIAGNOSIS — N183 Chronic kidney disease, stage 3 (moderate): Secondary | ICD-10-CM | POA: Diagnosis not present

## 2016-05-13 DIAGNOSIS — F09 Unspecified mental disorder due to known physiological condition: Secondary | ICD-10-CM

## 2016-05-13 DIAGNOSIS — G35D Multiple sclerosis, unspecified: Secondary | ICD-10-CM

## 2016-05-13 DIAGNOSIS — C50412 Malignant neoplasm of upper-outer quadrant of left female breast: Secondary | ICD-10-CM | POA: Diagnosis not present

## 2016-05-13 MED ORDER — ZOLPIDEM TARTRATE 5 MG PO TABS: 5.0000 mg | ORAL_TABLET | Freq: Every evening | ORAL | Status: DC | PRN

## 2016-05-13 NOTE — Progress Notes (Signed)
Patient ID: Michele Page, female   DOB: [birth year], 60 y.o.   MRN: [id number]    Location:  [facility] Place of Service: OFFICE  Chief Complaint  Patient presents with  . Medical Management of Chronic Issues    3 month follow up    HPI:  60 yo female seen today for f/u. Since her last OV she was dx with left breast CA stage 1A (T1b, N0, M0) invasive ductal grade 2/DCIS and is s/p lumpectomy with AND. She has appt with Rad Onc later this week to discuss XRT. She was told no chemotx needed. She has trouble falling asleep since dx.  Nothing tried OTC  She is a poor historian due to expressive aphasia/memory loss. Hx obtained from chart  HTN - stable on nicardipine, labetalol and losartan. BP at home 130s/70-80s. She does take meds at same time every day  COPD - no exacerbations. No issues with breathing  Hx CVA - no new sx's. Takes statin and ASA daily. LDL 99  MS - no obvious sx's - no diplopia but she cannot "stare" for prolonged time without blinking.  Gastritis/GERD - she stopped her nexium on her own.  She had a colonoscopy last yr and tubular adenoma polyps removed. No acid reflux sx's.  CKD - probably due to HTN. Cr stable at 1.47   Past Medical History  Diagnosis Date  . Hypertension   . Multiple sclerosis ([place])   . Vision abnormalities   . Colon polyps   . Hyperlipidemia   . Breast cancer of upper-outer quadrant of left female breast ([place]) 04/02/2016  . Breast cancer ([place])   . COPD (chronic obstructive pulmonary disease) ([place])   . Arthritis     lt knee  . Stroke [facility])     2013, mild cognitive deficits    Past Surgical History  Procedure Laterality Date  . Vaginal delivery      x3  . Tubal ligation    . Hysteroscopy w/d&c N/A 12/19/2015    Procedure: DILATATION AND CURETTAGE /HYSTEROSCOPY;  Surgeon: Linda Hedges, DO;  Location: [facility];  Service: Gynecology;  Laterality: N/A;  . Radioactive seed guided mastectomy with axillary sentinel lymph node biopsy Left 04/17/2016   Procedure: RADIOACTIVE SEED GUIDED PARTIAL MASTECTOMY WITH AXILLARY SENTINEL LYMPH NODE BIOPSY;  Surgeon: Erroll Luna, MD;  Location: [facility];  Service: General;  Laterality: Left;  RADIOACTIVE SEED GUIDED PARTIAL MASTECTOMY WITH AXILLARY SENTINEL LYMPH NODE BIOPSY     Patient Care Team: Gildardo Cranker, DO as PCP - General (Internal Medicine) Nicholas Lose, MD as Consulting Physician (Oncology) Erroll Luna, MD as Consulting Physician (General Surgery) Thea Silversmith, MD as Consulting Physician (Radiation Oncology)  Social History   Social History  . Marital Status: Married    Spouse Name: N/A  . Number of Children: 3  . Years of Education: N/A   Occupational History  . retired    Social History Main Topics  . Smoking status: Former Smoker -- 0.50 packs/day for 25 years    Types: Cigarettes    Quit date: 05/21/2002  . Smokeless tobacco: Never Used  . Alcohol Use: 0.0 oz/week    0 Standard drinks or equivalent per week     Comment: occas  . Drug Use: No  . Sexual Activity: No   Other Topics Concern  . Not on file   Social History Narrative   Diet- N/A   Caffeine- Yes   Married- Yes   House- 2 story with 2 people  Pets- No   Current/past profession- Nurse, mental health   Exercise- Yes   Living will-No   DNR-N/A   POA/HPOA-No           reports that she quit smoking about 13 years ago. Her smoking use included Cigarettes. She has a 12.5 pack-year smoking history. She has never used smokeless tobacco. She reports that she drinks alcohol. She reports that she does not use illicit drugs.  Family History  Problem Relation Age of Onset  . Stroke Sister   . Alcohol abuse Brother   . HIV/AIDS Brother   . Hypertension Mother   . Stroke Mother    Family Status  Relation Status Death Age  . Sister Deceased   . Father Deceased 22  . Mother Deceased 77  . Brother Deceased   . Brother Deceased   . Brother Deceased      No Known  Allergies  Medications: Patient's Medications  New Prescriptions   No medications on file  Previous Medications   ASPIRIN EC 81 MG TABLET    Take 1 tablet (81 mg total) by mouth daily.   IBUPROFEN (ADVIL,MOTRIN) 600 MG TABLET    Take 1 tablet (600 mg total) by mouth every 6 (six) hours as needed.   LABETALOL (NORMODYNE) 300 MG TABLET    Take 2 tablets (600 mg total) by mouth 3 (three) times daily.   LOSARTAN (COZAAR) 100 MG TABLET    TAKE ONE TABLET BY MOUTH ONCE DAILY FOR BLOOD PRESSURE   NIFEDIPINE (PROCARDIA XL/ADALAT-CC) 60 MG 24 HR TABLET    Take 1 tablet (60 mg total) by mouth 2 (two) times daily.   PRAVASTATIN (PRAVACHOL) 10 MG TABLET    TAKE ONE TABLET BY MOUTH AT BEDTIME  Modified Medications   No medications on file  Discontinued Medications   OXYCODONE-ACETAMINOPHEN (ROXICET) 5-325 MG TABLET    Take 1 tablet by mouth every 4 (four) hours as needed.   TRAMADOL (ULTRAM) 50 MG TABLET    Take 1 tablet (50 mg total) by mouth every 6 (six) hours as needed.    Review of Systems  Unable to perform ROS: Other  memory loss/expressive aphasia  Filed Vitals:   05/13/16 1037  BP: 144/88  Pulse: 69  Temp: 98.5 F (36.9 C)  TempSrc: Oral  Height: 5' 3"  (1.6 m)  Weight: 202 lb 6.4 oz (91.808 kg)  SpO2: 95%   Body mass index is 35.86 kg/(m^2).  Physical Exam  Constitutional: She is oriented to person, place, and time. She appears well-developed and well-nourished.  HENT:  Mouth/Throat: Oropharynx is clear and moist. No oropharyngeal exudate.  Eyes: Pupils are equal, round, and reactive to light. No scleral icterus.  Neck: Neck supple. Carotid bruit is not present. No tracheal deviation present. No thyromegaly present.  Cardiovascular: Normal rate, regular rhythm and intact distal pulses.  Exam reveals no gallop and no friction rub.   Murmur (1/6) heard. No LE edema b/l. no calf TTP. No LUE edema  Pulmonary/Chest: Effort normal and breath sounds normal. No stridor. No  respiratory distress. She has no wheezes. She has no rales. Right breast exhibits no inverted nipple, no mass, no nipple discharge, no skin change and no tenderness. Left breast exhibits no inverted nipple, no mass, no nipple discharge, no skin change and no tenderness. Breasts are asymmetrical.    Abdominal: Soft. Bowel sounds are normal. She exhibits no distension and no mass. There is no hepatomegaly. There is no tenderness. There is no rebound  and no guarding.  Musculoskeletal: She exhibits edema.  Lymphadenopathy:    She has no cervical adenopathy.  Neurological: She is alert and oriented to person, place, and time.  Skin: Skin is warm and dry. No rash noted.  Psychiatric: She has a normal mood and affect. Her behavior is normal. Thought content normal. Her speech is slurred.     Labs reviewed: Appointment on 04/08/2016  Component Date Value Ref Range Status  . WBC 04/08/2016 4.9  3.9 - 10.3 10e3/uL Final  . NEUT# 04/08/2016 3.1  1.5 - 6.5 10e3/uL Final  . HGB 04/08/2016 12.6  11.6 - 15.9 g/dL Final  . HCT 04/08/2016 38.0  34.8 - 46.6 % Final  . Platelets 04/08/2016 271  145 - 400 10e3/uL Final  . MCV 04/08/2016 93.6  79.5 - 101.0 fL Final  . MCH 04/08/2016 31.0  25.1 - 34.0 pg Final  . MCHC 04/08/2016 33.2  31.5 - 36.0 g/dL Final  . RBC 04/08/2016 4.06  3.70 - 5.45 10e6/uL Final  . RDW 04/08/2016 13.4  11.2 - 14.5 % Final  . lymph# 04/08/2016 1.4  0.9 - 3.3 10e3/uL Final  . MONO# 04/08/2016 0.3  0.1 - 0.9 10e3/uL Final  . Eosinophils Absolute 04/08/2016 0.1  0.0 - 0.5 10e3/uL Final  . Basophils Absolute 04/08/2016 0.1  0.0 - 0.1 10e3/uL Final  . NEUT% 04/08/2016 61.7  38.4 - 76.8 % Final  . LYMPH% 04/08/2016 28.7  14.0 - 49.7 % Final  . MONO% 04/08/2016 6.8  0.0 - 14.0 % Final  . EOS% 04/08/2016 1.5  0.0 - 7.0 % Final  . BASO% 04/08/2016 1.3  0.0 - 2.0 % Final  . Sodium 04/08/2016 141  136 - 145 mEq/L Final  . Potassium 04/08/2016 4.7  3.5 - 5.1 mEq/L Final  . Chloride  04/08/2016 106  98 - 109 mEq/L Final  . CO2 04/08/2016 26  22 - 29 mEq/L Final  . Glucose 04/08/2016 124  70 - 140 mg/dl Final   Glucose reference range is for nonfasting patients. Fasting glucose reference range is 70- 100.  Marland Kitchen BUN 04/08/2016 16.3  7.0 - 26.0 mg/dL Final  . Creatinine 04/08/2016 1.5* 0.6 - 1.1 mg/dL Final  . Total Bilirubin 04/08/2016 0.58  0.20 - 1.20 mg/dL Final  . Alkaline Phosphatase 04/08/2016 90  40 - 150 U/L Final  . AST 04/08/2016 25  5 - 34 U/L Final  . ALT 04/08/2016 23  0 - 55 U/L Final  . Total Protein 04/08/2016 7.6  6.4 - 8.3 g/dL Final  . Albumin 04/08/2016 4.3  3.5 - 5.0 g/dL Final  . Calcium 04/08/2016 10.0  8.4 - 10.4 mg/dL Final  . Anion Gap 04/08/2016 9  3 - 11 mEq/L Final  . EGFR 04/08/2016 45* >90 ml/min/1.73 m2 Final   eGFR is calculated using the CKD-EPI Creatinine Equation (2009)    Nm Sentinel Node Inj-no Rpt (breast)  04/17/2016  CLINICAL DATA: left breast cancer Sulfur colloid was injected intradermally by the nuclear medicine technologist for breast cancer sentinel node localization.   Mm Breast Surgical Specimen  04/17/2016  CLINICAL DATA:  Status post excision of a left breast carcinoma following radioactive seed localization. EXAM: SPECIMEN RADIOGRAPH OF THE LEFT BREAST COMPARISON:  Previous exam(s). FINDINGS: Status post excision of the left breast. The radioactive seed and biopsy marker clips are present, completely intact, and were marked for pathology. IMPRESSION: Specimen radiograph of the left breast. Electronically Signed   By: Dedra Skeens.D.  On: 04/17/2016 10:53   Mm Lt Radioactive Seed Loc Mammo Guide  04/15/2016  CLINICAL DATA:  Patient with left breast cancer presents today for radioactive seed localization facilitating upcoming breast conservation surgery. EXAM: MAMMOGRAPHIC GUIDED RADIOACTIVE SEED LOCALIZATION OF THE LEFT BREAST COMPARISON:  Previous exam(s). FINDINGS: Patient presents for radioactive seed localization prior  to lumpectomy. I met with the patient and we discussed the procedure of seed localization including benefits and alternatives. We discussed the high likelihood of a successful procedure. We discussed the risks of the procedure including infection, bleeding, tissue injury and further surgery. We discussed the low dose of radioactivity involved in the procedure. Informed, written consent was given. The usual time-out protocol was performed immediately prior to the procedure. Using mammographic guidance, sterile technique, 1% lidocaine and an I-125 radioactive seed, the left breast mass which contains the ribbon shaped clip was localized using a lateral approach. The follow-up mammogram images confirm the seed in the expected location and were marked for Dr. Brantley Stage. Follow-up survey of the patient confirms presence of the radioactive seed. Order number of I-125 seed:  [id number]. Total activity:  8.599 millicuries  Reference Date: 04/16/2006 The patient tolerated the procedure well and was released from the [facility]. She was given instructions regarding seed removal. IMPRESSION: Radioactive seed localization left breast. No apparent complications. Electronically Signed   By: Franki Cabot M.D.   On: 04/15/2016 14:18     Assessment/Plan   ICD-9-CM ICD-10-CM   1. Insomnia - uncontrolled 780.52 G47.00 zolpidem (AMBIEN) 5 MG tablet  2. Breast cancer of 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) 174.4 C50.412   3. History of stroke with residual deficit 438.9 I69.30   4. Essential hypertension 401.9 I10   5. Multiple sclerosis ([place]) 340 G35   6. Cognitive disorder 294.9 F09   7. CKD (chronic kidney disease), stage 3 (moderate) 585.3 N18.3   8. Hyperlipidemia 272.4 E78.5 Lipid Panel   Continue current medications as ordered  Follow up with specialists as scheduled  Must get full 8 hours of sleep on ambien  NO BLOOD PRESSURES, IV'S OR BLOOD DRAWS FROM LEFT ARM  Follow up in 3 mos for routine  visit   Crews Mccollam S. Perlie Gold  [facility] [address] [place], [place] [address] [phone] Cell (Monday-Friday 8 AM - 5 PM) [phone] After 5 PM and follow prompts

## 2016-05-13 NOTE — Patient Instructions (Signed)
Continue current medications as ordered  Follow up with specialists as scheduled  Must get full 8 hours of sleep on ambien  NO BLOOD PRESSURES, IV'S OR BLOOD DRAWS FROM LEFT ARM  Follow up in 3 mos for routine visit

## 2016-05-14 ENCOUNTER — Ambulatory Visit (INDEPENDENT_AMBULATORY_CARE_PROVIDER_SITE_OTHER): Payer: Commercial Managed Care - HMO | Admitting: Pharmacist Clinician (PhC)/ Clinical Pharmacy Specialist

## 2016-05-14 ENCOUNTER — Encounter: Payer: Self-pay | Admitting: Pharmacist Clinician (PhC)/ Clinical Pharmacy Specialist

## 2016-05-14 VITALS — BP 138/80 | HR 72 | Ht 63.0 in | Wt 204.6 lb

## 2016-05-14 DIAGNOSIS — I10 Essential (primary) hypertension: Secondary | ICD-10-CM

## 2016-05-14 LAB — LIPID PANEL
CHOLESTEROL TOTAL: 196 mg/dL (ref 100–199)
Chol/HDL Ratio: 3.1 ratio units (ref 0.0–4.4)
HDL: 64 mg/dL (ref >39)
LDL CALC: 110 mg/dL — AB (ref 0–99)
TRIGLYCERIDES: 110 mg/dL (ref 0–149)
VLDL CHOLESTEROL CAL: 22 mg/dL (ref 5–40)

## 2016-05-14 MED ORDER — AMLODIPINE BESYLATE 10 MG PO TABS: 10.0000 mg | ORAL_TABLET | Freq: Every day | ORAL | Status: DC

## 2016-05-14 NOTE — Assessment & Plan Note (Signed)
Patient is anxious to increase her nifedipine as she feels it isn't working enough.  She takes it daily at 3 pm then her BP is usually high an hour or two later.  I am going to switch her to amlodipine 5 mg daily x 10 days then increase to 10 mg daily.  She is to take this in the evenings.  I explained that her BP will still fluctuate some while the medication starts to work, but she needs to be patient.  Also am going to move her 6 pm labetolol to 3 pm, as I suspect this is the real reason for evening rises in BP.  Will see her back in 3 weeks for follow up.

## 2016-05-14 NOTE — Patient Instructions (Addendum)
Return for a a follow up appointment in 3 weeks  Your blood pressure today is 138/80  Check your blood pressure at home daily and keep record of the readings.  Take your BP meds as follows: 8 am  - labetolol 600 mg, losartan 100 mg 2-3 pm - labetolol 600 mg 8-9 pm - labetolol 600 mg, amlodipine 5 mg for 10 days, then increase to 10 mg)  Bring all of your meds, your BP cuff and your record of home blood pressures to your next appointment.  Exercise as you're able, try to walk approximately 30 minutes per day.  Keep salt intake to a minimum, especially watch canned and prepared boxed foods.  Eat more fresh fruits and vegetables and fewer canned items.  Avoid eating in fast food restaurants.    HOW TO TAKE YOUR BLOOD PRESSURE: . Rest 5 minutes before taking your blood pressure. .  Don't smoke or drink caffeinated beverages for at least 30 minutes before. . Take your blood pressure before (not after) you eat. . Sit comfortably with your back supported and both feet on the floor (don't cross your legs). . Elevate your arm to heart level on a table or a desk. . Use the proper sized cuff. It should fit smoothly and snugly around your bare upper arm. There should be enough room to slip a fingertip under the cuff. The bottom edge of the cuff should be 1 inch above the crease of the elbow. . Ideally, take 3 measurements at one sitting and record the average.

## 2016-05-14 NOTE — Progress Notes (Signed)
05/14/2016 Michele Page 10/09/1956 [id number]   HPI:  Michele Page is a 59 y.o. female patient of Dr Gwenlyn Found, with a PMH below who presents today for hypertension clinic evaluation.  [name] was referred to Dr. Gwenlyn Found by her PCP for long term hypertensive issues.  She tells me that she has had high blood pressure since she was in her early 20's.  She was recently diagnosed with breast cancer and will be starting radiation in the near future.  She reports her home BP does well early in the morning and late at night, but from 4-7 pm she notes it tends to increase as high as [id number] systolic.  She did not record those readings.  At her last visit we switched the nifedipine 60 mg bid from 30 mg tablets to 60 mg tablets to decrease her pill burden.   Today she states that it doesn't work well and wants to increase the dose.    Cardiac Hx: stroke 4 years ago (age 51) with some residual expressive aphasia  Family Hx: mother died from stroke at 75, sister from stroke in her 64's; both believed to be related to ongoing hypertension; father died from MI at 63;  She has 5 brothers (2 still living), but none had/have cardiovascular issues  Social Hx: quit tobacco 15+ years ago; only rare alcohol, drinks 1 cup coffee per day at most  Diet: eats almost all meals at home, only small amounts of salt used.  Currently eating larger portions of vegetables, cut meats out and eats only chicken  Exercise: gym 5 days per week; stays about 2 hours doing both cardio and strength training  Home BP readings: average 130-140s in mornings.  Highest 150, lowest 125.  Diastolic all WNL  Current antihypertensive medications:   Labetolol 600 mg tid  (8 am, 6 pm, 9 pm)  Losartan 100 mg qd (8 am)  Nifedipine XL 60 mg bid (3 pm, 8 pm)  Intolerances: none  Wt Readings from Last 3 Encounters:  05/14/16 204 lb 9.6 oz (92.806 kg)  05/13/16 202 lb 6.4 oz (91.808 kg)  04/27/16 203 lb 3.2 oz (92.171 kg)   BP Readings from Last 3  Encounters:  05/14/16 138/80  05/13/16 144/88  04/27/16 123/75   Pulse Readings from Last 3 Encounters:  05/14/16 72  05/13/16 69  04/27/16 82    Current Outpatient Prescriptions  Medication Sig Dispense Refill  . amLODipine (NORVASC) 10 MG tablet Take 1 tablet (10 mg total) by mouth daily. 30 tablet 5  . aspirin EC 81 MG tablet Take 1 tablet (81 mg total) by mouth daily. 30 tablet 6  . ibuprofen (ADVIL,MOTRIN) 600 MG tablet Take 1 tablet (600 mg total) by mouth every 6 (six) hours as needed. 30 tablet 0  . labetalol (NORMODYNE) 300 MG tablet Take 2 tablets (600 mg total) by mouth 3 (three) times daily. 180 tablet 4  . losartan (COZAAR) 100 MG tablet TAKE ONE TABLET BY MOUTH ONCE DAILY FOR BLOOD PRESSURE 90 tablet 0  . NIFEdipine (PROCARDIA XL/ADALAT-CC) 60 MG 24 hr tablet Take 1 tablet (60 mg total) by mouth 2 (two) times daily. 60 tablet 3  . pravastatin (PRAVACHOL) 10 MG tablet TAKE ONE TABLET BY MOUTH AT BEDTIME 30 tablet 0  . zolpidem (AMBIEN) 5 MG tablet Take 1 tablet (5 mg total) by mouth at bedtime as needed for sleep. 15 tablet 1   No current facility-administered medications for this visit.    No  Known Allergies  Past Medical History  Diagnosis Date  . Hypertension   . Multiple sclerosis ([place])   . Vision abnormalities   . Colon polyps   . Hyperlipidemia   . Breast cancer of upper-outer quadrant of left female breast ([place]) 04/02/2016  . Breast cancer ([place])   . COPD (chronic obstructive pulmonary disease) ([place])   . Arthritis     lt knee  . Stroke ([place])     2013, mild cognitive deficits    Blood pressure 138/80, pulse 72, height 5\' 3"  (1.6 m), weight 204 lb 9.6 oz (92.806 kg).    Tommy Medal PharmD CPP [facility]

## 2016-05-15 ENCOUNTER — Ambulatory Visit
Admission: RE | Admit: 2016-05-15 | Discharge: 2016-05-15 | Disposition: A | Discharge status: Home / Self Care | Payer: Commercial Managed Care - HMO | Source: Ambulatory Visit | Attending: Radiation Oncology | Admitting: Radiation Oncology

## 2016-05-15 ENCOUNTER — Encounter: Payer: Self-pay | Admitting: Radiation Oncology

## 2016-05-15 VITALS — BP 136/88 | HR 72 | Resp 16 | Ht 63.0 in | Wt 205.5 lb

## 2016-05-15 DIAGNOSIS — Z7982 Long term (current) use of aspirin: Secondary | ICD-10-CM | POA: Insufficient documentation

## 2016-05-15 DIAGNOSIS — Z170 Estrogen receptor positive status [ER+]: Secondary | ICD-10-CM | POA: Insufficient documentation

## 2016-05-15 DIAGNOSIS — G35 Multiple sclerosis: Secondary | ICD-10-CM | POA: Insufficient documentation

## 2016-05-15 DIAGNOSIS — I10 Essential (primary) hypertension: Secondary | ICD-10-CM | POA: Insufficient documentation

## 2016-05-15 DIAGNOSIS — Z87891 Personal history of nicotine dependence: Secondary | ICD-10-CM | POA: Insufficient documentation

## 2016-05-15 DIAGNOSIS — Z510 Encounter for antineoplastic radiation therapy: Secondary | ICD-10-CM | POA: Insufficient documentation

## 2016-05-15 DIAGNOSIS — Z8249 Family history of ischemic heart disease and other diseases of the circulatory system: Secondary | ICD-10-CM | POA: Insufficient documentation

## 2016-05-15 DIAGNOSIS — C50412 Malignant neoplasm of upper-outer quadrant of left female breast: Secondary | ICD-10-CM | POA: Insufficient documentation

## 2016-05-15 DIAGNOSIS — E785 Hyperlipidemia, unspecified: Secondary | ICD-10-CM | POA: Insufficient documentation

## 2016-05-15 DIAGNOSIS — Z823 Family history of stroke: Secondary | ICD-10-CM | POA: Insufficient documentation

## 2016-05-15 DIAGNOSIS — Z79899 Other long term (current) drug therapy: Secondary | ICD-10-CM | POA: Insufficient documentation

## 2016-05-15 DIAGNOSIS — J449 Chronic obstructive pulmonary disease, unspecified: Secondary | ICD-10-CM | POA: Insufficient documentation

## 2016-05-15 DIAGNOSIS — Z8673 Personal history of transient ischemic attack (TIA), and cerebral infarction without residual deficits: Secondary | ICD-10-CM | POA: Insufficient documentation

## 2016-05-15 NOTE — Progress Notes (Signed)
See progress note under physician encounter. 

## 2016-05-15 NOTE — Progress Notes (Addendum)
Location of Breast Cancer:Upper outer quadrant left breast cancer  Histology per Pathology Report:    Receptor Status: ER(+), PR (+), Her2-neu (-), Ki-(20%)  Did patient present with symptoms (if so, please note symptoms) or was this found on screening mammography?: abnormal mammogram 4-19/17  Past/Anticipated interventions by surgeon, if HJS:CBIP lumpectomy negative margins and nodes done 04/17/16  Past/Anticipated interventions by medical oncology, if any: consult, oncotype testing and planning for adjuvant antiestrogen  Lymphedema issues, if any:  no  Pain issues, if any:  yes mild discomfort under left arm and nipple tenderness  Left breast surgical incision well approximated without redness, drainage or edema. Denies nipple discharge. Demonstrates full ROM of both upper extremities.  SAFETY ISSUES:  Prior radiation? no  Pacemaker/ICD? no  Possible current pregnancy?no  Is the patient on methotrexate? no  Current Complaints / other details:  60 year old female. Married. Retired. Reports she had a stroke 4 years ago. Reports her sister died of metastatic breast cancer.  Gynecologic History Age at first menstrual period? 10 Are you still having periods? No If you no longer have periods: Have you used hormone replacement? No Obstetric History: How many children have you carried to term? 3 Your age at first live birth? 45 Pregnant now or trying to get pregnant? No Have you used birth control pills or hormone shots for contraception? No Health Maintenance: Have you ever had a colonoscopy? Yes Date of your last PAP smear? 03/30/2016    Joaquim Lai, RN 05/15/2016,10:59 AM

## 2016-05-16 NOTE — Progress Notes (Signed)
Radiation Oncology         [phone] ________________________________  Follow Up  Name: Michele Page MRN: [id number]  Date: 05/15/2016  DOB: Feb 27, 1956  IA:[id number], Brayton Layman, DO  Erroll Luna, MD   REFERRING PHYSICIAN: Erroll Luna, MD  DIAGNOSIS: The encounter diagnosis was Breast cancer of upper-outer quadrant of left female breast ([place]).    ICD-9-CM ICD-10-CM   1. Breast cancer of upper-outer quadrant of left female breast ([place]) 174.4 C50.412     HISTORY OF PRESENT ILLNESS: Michele Page is a 60 y.o. female with a newly diagnosed left breast cancer. She was found to have an abnormal mammogram 03/04/2016. She had a diagnostic mammogram and ultrasound of both breasts 03/11/2016, showing 2 slightly irregular masses in the upper-outer quadrant of the left breast and a mass in the inferior retroareolar right breast, approximately 5 mm. The ultrasound showed an irregular hypoechoic mass in the left breast at the 1 o'clock position, measuring 0.8 x 0.7 x 0.7 cm, a mixed-echogenicity area measuring 1.9 x 0.8 x 1.6 cm in the upper-outer quadrant of the left breast, and multiple benign cysts in the right breast. Left axillary ultrasound showed no abnormal lymph nodes. She had a biopsy 03/31/2016, revealing the left breast mass in the 1 o'clock position 4 CMFN is invasive and in situ ductal carcinoma, grade 2, ER (100%), PR (100%), Her2-neu negative, and Ki 67 (20%). The left breast mass in the 1 o'clock position 5 CMFN was fat necrosis.  She met in [facility] breast clinic on 04/08/16 and was counseled on lumpectomy with sentinel mapping followed by radiotherapy and anti-estrogen blockade. She was taken to the operating room on 04/17/16. 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 Margins were negative, and none of the lymph nodes were involved with disease. Her Oncotype testing was also performed following this. I do  not see the report in her chart, but per nurse navigation, she did not require chemotherapy. She comes to discuss the process of starting radiotherapy.  PREVIOUS RADIATION THERAPY: No  PAST MEDICAL HISTORY:  Past Medical History  Diagnosis Date  . Hypertension   . Multiple sclerosis ([place])   . Vision abnormalities   . Colon polyps   . Hyperlipidemia   . Breast cancer of upper-outer quadrant of left female breast ([place]) 04/02/2016  . Breast cancer ([place])   . COPD (chronic obstructive pulmonary disease) ([place])   . Arthritis     lt knee  . Stroke ([place])     2013, mild cognitive deficits      PAST SURGICAL HISTORY: Past Surgical History  Procedure Laterality Date  . Vaginal delivery      x3  . Tubal ligation    . Hysteroscopy w/d&c N/A 12/19/2015    Procedure: DILATATION AND CURETTAGE /HYSTEROSCOPY;  Surgeon: Linda Hedges, DO;  Location: [facility];  Service: Gynecology;  Laterality: N/A;  . Radioactive seed guided mastectomy with axillary sentinel lymph node biopsy Left 04/17/2016    Procedure: RADIOACTIVE SEED GUIDED PARTIAL MASTECTOMY WITH AXILLARY SENTINEL LYMPH NODE BIOPSY;  Surgeon: Erroll Luna, MD;  Location: [facility];  Service: General;  Laterality: Left;  RADIOACTIVE SEED GUIDED PARTIAL MASTECTOMY WITH AXILLARY SENTINEL LYMPH NODE BIOPSY     FAMILY HISTORY:  Family History  Problem Relation Age of Onset  . Stroke Sister   . Alcohol abuse Brother   . HIV/AIDS Brother   . Hypertension Mother   .  Stroke Mother     SOCIAL HISTORY:  Social History   Social History  . Marital Status: Married    Spouse Name: N/A  . Number of Children: 3  . Years of Education: N/A   Occupational History  . retired    Social History Main Topics  . Smoking status: Former Smoker -- 0.50 packs/day for 25 years    Types: Cigarettes    Quit date: 05/21/2002  . Smokeless tobacco: Never Used  . Alcohol Use: 0.0 oz/week    0 Standard drinks or equivalent per week     Comment:  occas  . Drug Use: No  . Sexual Activity: No   Other Topics Concern  . Not on file   Social History Narrative   Diet- N/A   Caffeine- Yes   Married- Yes   House- 2 story with 2 people   Pets- No   Current/past profession- Nurse, mental health   Exercise- Yes   Living will-No   DNR-N/A   POA/HPOA-No        The patient is married and retired. She lives in [place]. She is originally from [place].   ALLERGIES: Review of patient's allergies indicates no known allergies.  MEDICATIONS:  Current Outpatient Prescriptions  Medication Sig Dispense Refill  . aspirin EC 81 MG tablet Take 1 tablet (81 mg total) by mouth daily. 30 tablet 6  . ibuprofen (ADVIL,MOTRIN) 600 MG tablet Take 1 tablet (600 mg total) by mouth every 6 (six) hours as needed. 30 tablet 0  . labetalol (NORMODYNE) 300 MG tablet Take 2 tablets (600 mg total) by mouth 3 (three) times daily. 180 tablet 4  . losartan (COZAAR) 100 MG tablet TAKE ONE TABLET BY MOUTH ONCE DAILY FOR BLOOD PRESSURE 90 tablet 0  . pravastatin (PRAVACHOL) 10 MG tablet TAKE ONE TABLET BY MOUTH AT BEDTIME 30 tablet 0  . zolpidem (AMBIEN) 5 MG tablet Take 1 tablet (5 mg total) by mouth at bedtime as needed for sleep. 15 tablet 1  . amLODipine (NORVASC) 10 MG tablet Take 1 tablet (10 mg total) by mouth daily. (Patient not taking: Reported on 05/15/2016) 30 tablet 5  . NIFEdipine (PROCARDIA XL/ADALAT-CC) 60 MG 24 hr tablet Take 1 tablet (60 mg total) by mouth 2 (two) times daily. (Patient not taking: Reported on 05/15/2016) 60 tablet 3   No current facility-administered medications for this encounter.    REVIEW OF SYSTEMS:  On review of systems, the patient reports that she is doing well overall. She notes some soreness of her incision site but  denies any chest pain, shortness of breath, cough, fevers, chills, night sweats, unintended weight changes. She denies any bowel or bladder disturbances, and denies abdominal pain, nausea or vomiting. She denies  any new musculoskeletal or joint aches or pains. A complete review of systems is obtained and is otherwise negative.    PHYSICAL EXAM:  height is _0  (1.6 m) and weight is 205 lb 8 oz (93.214 kg). Her blood pressure is 136/88 and her pulse is 72. Her respiration is 16 and oxygen saturation is 100%.   Pain Scale 2/10 In general this is a well appearing African American female in no acute distress. She is alert and oriented x4 and appropriate throughout the examination. HEENT reveals that the patient is normocephalic, atraumatic. EOMs are intact. PERRLA. Skin is intact without any evidence of gross lesions. Cardiovascular exam reveals a regular rate and rhythm, no clicks rubs or murmurs are auscultated. Chest is clear  to auscultation bilaterally. The left breast is healing well and her periareolar incision is intact without erythema or separation. No fluid collection is seen. Lymphatic assessment is performed and does not reveal any adenopathy in the cervical, supraclavicular, axillary, or inguinal chains. Abdomen has active bowel sounds in all quadrants and is intact. The abdomen is soft, non tender, non distended. Lower extremities are negative for pretibial pitting edema, deep calf tenderness, cyanosis or clubbing.   KPS = 90  100 - Normal; no complaints; no evidence of disease. 90   - Able to carry on normal activity; minor signs or symptoms of disease. 80   - Normal activity with effort; some signs or symptoms of disease. 28   - Cares for self; unable to carry on normal activity or to do active work. 60   - Requires occasional assistance, but is able to care for most of his personal needs. 50   - Requires considerable assistance and frequent medical care. 67   - Disabled; requires special care and assistance. 46   - Severely disabled; hospital admission is indicated although death not imminent. 36   - Very sick; hospital admission necessary; active supportive treatment necessary. 10   -  Moribund; fatal processes progressing rapidly. 0     - Dead  Karnofsky DA, Abelmann [place], Craver LS and [facility] [phone]) The use of the nitrogen mustards in the palliative treatment of carcinoma: with particular reference to bronchogenic carcinoma Cancer 1 634-56  LABORATORY DATA:  Lab Results  Component Value Date   WBC 4.9 04/08/2016   HGB 12.6 04/08/2016   HCT 38.0 04/08/2016   MCV 93.6 04/08/2016   PLT 271 04/08/2016   Lab Results  Component Value Date   NA 141 04/08/2016   K 4.7 04/08/2016   CL 104 12/18/2015   CO2 26 04/08/2016   Lab Results  Component Value Date   ALT 23 04/08/2016   AST 25 04/08/2016   ALKPHOS 90 04/08/2016   BILITOT 0.58 04/08/2016     RADIOGRAPHY: Nm Sentinel Node Inj-no Rpt (breast)  04/17/2016  CLINICAL DATA: left breast cancer Sulfur colloid was injected intradermally by the nuclear medicine technologist for breast cancer sentinel node localization.   Mm Breast Surgical Specimen  04/17/2016  CLINICAL DATA:  Status post excision of a left breast carcinoma following radioactive seed localization. EXAM: SPECIMEN RADIOGRAPH OF THE LEFT BREAST COMPARISON:  Previous exam(s). FINDINGS: Status post excision of the left breast. The radioactive seed and biopsy marker clips are present, completely intact, and were marked for pathology. IMPRESSION: Specimen radiograph of the left breast. Electronically Signed   By: Lajean Manes M.D.   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  PLAN: Dr. Tammi Klippel discusses the pathology findings and reviews the nature of invasive breast disease. The consensus from the breast conference include breast conservation with lumpectomy to both sites,which she has now completed. We would again agree with the previously proposed treatment including  external radiotherapy to the left breast followed by antiestrogen therapy. We disucssed  the logistics and delivery of radiation, the risks, benefits, short, and long term effects of treatment. Dr. Tammi Klippel recommends 33 treatments over 6 1/2 weeks to the left breast and we discussed the options for breath hold technique to decrease the risk of exposure to the heart. We discussed the simulation session and have given her an appointment to proceed  with this. At the end of the conversation all questions were answered to her satisfaction. Written consent was obtained, and we will move forward with her treatment.   The above documentation reflects my direct findings during this shared patient visit. Please see the separate note by Dr. Tammi Klippel on this date for the remainder of the patient's plan of care.   Carola Rhine, PAC

## 2016-05-18 ENCOUNTER — Ambulatory Visit
Admission: RE | Admit: 2016-05-18 | Discharge: 2016-05-18 | Disposition: A | Discharge status: Home / Self Care | Payer: Commercial Managed Care - HMO | Source: Ambulatory Visit | Attending: Radiation Oncology | Admitting: Radiation Oncology

## 2016-05-18 DIAGNOSIS — Z8249 Family history of ischemic heart disease and other diseases of the circulatory system: Secondary | ICD-10-CM | POA: Diagnosis not present

## 2016-05-18 DIAGNOSIS — Z79899 Other long term (current) drug therapy: Secondary | ICD-10-CM | POA: Diagnosis not present

## 2016-05-18 DIAGNOSIS — Z510 Encounter for antineoplastic radiation therapy: Secondary | ICD-10-CM | POA: Diagnosis not present

## 2016-05-18 DIAGNOSIS — I10 Essential (primary) hypertension: Secondary | ICD-10-CM | POA: Diagnosis not present

## 2016-05-18 DIAGNOSIS — Z170 Estrogen receptor positive status [ER+]: Secondary | ICD-10-CM | POA: Diagnosis not present

## 2016-05-18 DIAGNOSIS — G35 Multiple sclerosis: Secondary | ICD-10-CM | POA: Diagnosis not present

## 2016-05-18 DIAGNOSIS — Z87891 Personal history of nicotine dependence: Secondary | ICD-10-CM | POA: Diagnosis not present

## 2016-05-18 DIAGNOSIS — C50412 Malignant neoplasm of upper-outer quadrant of left female breast: Secondary | ICD-10-CM | POA: Diagnosis not present

## 2016-05-18 DIAGNOSIS — Z8673 Personal history of transient ischemic attack (TIA), and cerebral infarction without residual deficits: Secondary | ICD-10-CM | POA: Diagnosis not present

## 2016-05-18 DIAGNOSIS — E785 Hyperlipidemia, unspecified: Secondary | ICD-10-CM | POA: Diagnosis not present

## 2016-05-18 DIAGNOSIS — J449 Chronic obstructive pulmonary disease, unspecified: Secondary | ICD-10-CM | POA: Diagnosis not present

## 2016-05-18 DIAGNOSIS — Z823 Family history of stroke: Secondary | ICD-10-CM | POA: Diagnosis not present

## 2016-05-18 DIAGNOSIS — Z7982 Long term (current) use of aspirin: Secondary | ICD-10-CM | POA: Diagnosis not present

## 2016-05-21 ENCOUNTER — Encounter: Payer: Self-pay | Admitting: *Deleted

## 2016-05-22 ENCOUNTER — Telehealth: Payer: Self-pay | Admitting: Hematology and Oncology

## 2016-05-22 NOTE — Telephone Encounter (Signed)
appt made for Sept and calendar was sent to patient by mail

## 2016-05-25 ENCOUNTER — Ambulatory Visit: Payer: Commercial Managed Care - HMO | Admitting: Radiation Oncology

## 2016-05-25 DIAGNOSIS — Z510 Encounter for antineoplastic radiation therapy: Secondary | ICD-10-CM | POA: Diagnosis not present

## 2016-05-25 DIAGNOSIS — J449 Chronic obstructive pulmonary disease, unspecified: Secondary | ICD-10-CM | POA: Diagnosis not present

## 2016-05-25 DIAGNOSIS — E785 Hyperlipidemia, unspecified: Secondary | ICD-10-CM | POA: Diagnosis not present

## 2016-05-25 DIAGNOSIS — C50412 Malignant neoplasm of upper-outer quadrant of left female breast: Secondary | ICD-10-CM | POA: Diagnosis not present

## 2016-05-25 DIAGNOSIS — Z170 Estrogen receptor positive status [ER+]: Secondary | ICD-10-CM | POA: Diagnosis not present

## 2016-05-25 DIAGNOSIS — Z79899 Other long term (current) drug therapy: Secondary | ICD-10-CM | POA: Diagnosis not present

## 2016-05-25 DIAGNOSIS — G35 Multiple sclerosis: Secondary | ICD-10-CM | POA: Diagnosis not present

## 2016-05-25 DIAGNOSIS — Z8673 Personal history of transient ischemic attack (TIA), and cerebral infarction without residual deficits: Secondary | ICD-10-CM | POA: Diagnosis not present

## 2016-05-25 DIAGNOSIS — I10 Essential (primary) hypertension: Secondary | ICD-10-CM | POA: Diagnosis not present

## 2016-05-26 ENCOUNTER — Ambulatory Visit: Payer: Commercial Managed Care - HMO

## 2016-05-26 ENCOUNTER — Ambulatory Visit
Admission: RE | Admit: 2016-05-26 | Discharge: 2016-05-26 | Disposition: A | Discharge status: Home / Self Care | Payer: Commercial Managed Care - HMO | Source: Ambulatory Visit | Attending: Radiation Oncology | Admitting: Radiation Oncology

## 2016-05-26 DIAGNOSIS — I10 Essential (primary) hypertension: Secondary | ICD-10-CM | POA: Diagnosis not present

## 2016-05-26 DIAGNOSIS — E785 Hyperlipidemia, unspecified: Secondary | ICD-10-CM | POA: Diagnosis not present

## 2016-05-26 DIAGNOSIS — Z510 Encounter for antineoplastic radiation therapy: Secondary | ICD-10-CM | POA: Diagnosis not present

## 2016-05-26 DIAGNOSIS — Z8673 Personal history of transient ischemic attack (TIA), and cerebral infarction without residual deficits: Secondary | ICD-10-CM | POA: Diagnosis not present

## 2016-05-26 DIAGNOSIS — J449 Chronic obstructive pulmonary disease, unspecified: Secondary | ICD-10-CM | POA: Diagnosis not present

## 2016-05-26 DIAGNOSIS — G35 Multiple sclerosis: Secondary | ICD-10-CM | POA: Diagnosis not present

## 2016-05-26 DIAGNOSIS — Z170 Estrogen receptor positive status [ER+]: Secondary | ICD-10-CM | POA: Diagnosis not present

## 2016-05-26 DIAGNOSIS — C50412 Malignant neoplasm of upper-outer quadrant of left female breast: Secondary | ICD-10-CM | POA: Diagnosis not present

## 2016-05-26 DIAGNOSIS — Z79899 Other long term (current) drug therapy: Secondary | ICD-10-CM | POA: Diagnosis not present

## 2016-05-27 ENCOUNTER — Ambulatory Visit
Admission: RE | Admit: 2016-05-27 | Discharge: 2016-05-27 | Disposition: A | Discharge status: Home / Self Care | Payer: Commercial Managed Care - HMO | Source: Ambulatory Visit | Attending: Radiation Oncology | Admitting: Radiation Oncology

## 2016-05-27 DIAGNOSIS — Z510 Encounter for antineoplastic radiation therapy: Secondary | ICD-10-CM | POA: Diagnosis not present

## 2016-05-27 DIAGNOSIS — Z8673 Personal history of transient ischemic attack (TIA), and cerebral infarction without residual deficits: Secondary | ICD-10-CM | POA: Diagnosis not present

## 2016-05-27 DIAGNOSIS — C50412 Malignant neoplasm of upper-outer quadrant of left female breast: Secondary | ICD-10-CM | POA: Diagnosis not present

## 2016-05-27 DIAGNOSIS — Z170 Estrogen receptor positive status [ER+]: Secondary | ICD-10-CM | POA: Diagnosis not present

## 2016-05-27 DIAGNOSIS — Z79899 Other long term (current) drug therapy: Secondary | ICD-10-CM | POA: Diagnosis not present

## 2016-05-27 DIAGNOSIS — G35 Multiple sclerosis: Secondary | ICD-10-CM | POA: Diagnosis not present

## 2016-05-27 DIAGNOSIS — J449 Chronic obstructive pulmonary disease, unspecified: Secondary | ICD-10-CM | POA: Diagnosis not present

## 2016-05-27 DIAGNOSIS — I10 Essential (primary) hypertension: Secondary | ICD-10-CM | POA: Diagnosis not present

## 2016-05-27 DIAGNOSIS — E785 Hyperlipidemia, unspecified: Secondary | ICD-10-CM | POA: Diagnosis not present

## 2016-05-28 ENCOUNTER — Ambulatory Visit
Admission: RE | Admit: 2016-05-28 | Discharge: 2016-05-28 | Disposition: A | Discharge status: Home / Self Care | Payer: Commercial Managed Care - HMO | Source: Ambulatory Visit | Attending: Radiation Oncology | Admitting: Radiation Oncology

## 2016-05-28 DIAGNOSIS — E785 Hyperlipidemia, unspecified: Secondary | ICD-10-CM | POA: Diagnosis not present

## 2016-05-28 DIAGNOSIS — I10 Essential (primary) hypertension: Secondary | ICD-10-CM | POA: Diagnosis not present

## 2016-05-28 DIAGNOSIS — C50412 Malignant neoplasm of upper-outer quadrant of left female breast: Secondary | ICD-10-CM | POA: Diagnosis not present

## 2016-05-28 DIAGNOSIS — G35 Multiple sclerosis: Secondary | ICD-10-CM | POA: Diagnosis not present

## 2016-05-28 DIAGNOSIS — Z79899 Other long term (current) drug therapy: Secondary | ICD-10-CM | POA: Diagnosis not present

## 2016-05-28 DIAGNOSIS — J449 Chronic obstructive pulmonary disease, unspecified: Secondary | ICD-10-CM | POA: Diagnosis not present

## 2016-05-28 DIAGNOSIS — Z170 Estrogen receptor positive status [ER+]: Secondary | ICD-10-CM | POA: Diagnosis not present

## 2016-05-28 DIAGNOSIS — Z510 Encounter for antineoplastic radiation therapy: Secondary | ICD-10-CM | POA: Diagnosis not present

## 2016-05-28 DIAGNOSIS — Z8673 Personal history of transient ischemic attack (TIA), and cerebral infarction without residual deficits: Secondary | ICD-10-CM | POA: Diagnosis not present

## 2016-05-29 ENCOUNTER — Ambulatory Visit
Admission: RE | Admit: 2016-05-29 | Discharge: 2016-05-29 | Disposition: A | Discharge status: Home / Self Care | Payer: Commercial Managed Care - HMO | Source: Ambulatory Visit | Attending: Radiation Oncology | Admitting: Radiation Oncology

## 2016-05-29 VITALS — BP 147/79 | HR 64 | Resp 18 | Wt 203.8 lb

## 2016-05-29 DIAGNOSIS — Z8673 Personal history of transient ischemic attack (TIA), and cerebral infarction without residual deficits: Secondary | ICD-10-CM | POA: Diagnosis not present

## 2016-05-29 DIAGNOSIS — I10 Essential (primary) hypertension: Secondary | ICD-10-CM | POA: Diagnosis not present

## 2016-05-29 DIAGNOSIS — Z510 Encounter for antineoplastic radiation therapy: Secondary | ICD-10-CM | POA: Diagnosis not present

## 2016-05-29 DIAGNOSIS — C50412 Malignant neoplasm of upper-outer quadrant of left female breast: Secondary | ICD-10-CM

## 2016-05-29 DIAGNOSIS — G35 Multiple sclerosis: Secondary | ICD-10-CM | POA: Diagnosis not present

## 2016-05-29 DIAGNOSIS — E785 Hyperlipidemia, unspecified: Secondary | ICD-10-CM | POA: Diagnosis not present

## 2016-05-29 DIAGNOSIS — J449 Chronic obstructive pulmonary disease, unspecified: Secondary | ICD-10-CM | POA: Diagnosis not present

## 2016-05-29 DIAGNOSIS — Z79899 Other long term (current) drug therapy: Secondary | ICD-10-CM | POA: Diagnosis not present

## 2016-05-29 DIAGNOSIS — Z170 Estrogen receptor positive status [ER+]: Secondary | ICD-10-CM | POA: Diagnosis not present

## 2016-06-01 ENCOUNTER — Ambulatory Visit
Admission: RE | Admit: 2016-06-01 | Discharge: 2016-06-01 | Disposition: A | Discharge status: Home / Self Care | Payer: Commercial Managed Care - HMO | Source: Ambulatory Visit | Attending: Radiation Oncology | Admitting: Radiation Oncology

## 2016-06-01 DIAGNOSIS — I10 Essential (primary) hypertension: Secondary | ICD-10-CM | POA: Diagnosis not present

## 2016-06-01 DIAGNOSIS — E785 Hyperlipidemia, unspecified: Secondary | ICD-10-CM | POA: Diagnosis not present

## 2016-06-01 DIAGNOSIS — Z510 Encounter for antineoplastic radiation therapy: Secondary | ICD-10-CM | POA: Diagnosis not present

## 2016-06-01 DIAGNOSIS — C50412 Malignant neoplasm of upper-outer quadrant of left female breast: Secondary | ICD-10-CM

## 2016-06-01 DIAGNOSIS — Z79899 Other long term (current) drug therapy: Secondary | ICD-10-CM | POA: Diagnosis not present

## 2016-06-01 DIAGNOSIS — G35 Multiple sclerosis: Secondary | ICD-10-CM | POA: Diagnosis not present

## 2016-06-01 DIAGNOSIS — J449 Chronic obstructive pulmonary disease, unspecified: Secondary | ICD-10-CM | POA: Diagnosis not present

## 2016-06-01 DIAGNOSIS — Z170 Estrogen receptor positive status [ER+]: Secondary | ICD-10-CM | POA: Diagnosis not present

## 2016-06-01 DIAGNOSIS — Z8673 Personal history of transient ischemic attack (TIA), and cerebral infarction without residual deficits: Secondary | ICD-10-CM | POA: Diagnosis not present

## 2016-06-01 MED ORDER — ALRA NON-METALLIC DEODORANT (RAD-ONC)
1.0000 "application " | Freq: Once | TOPICAL | Status: AC
  Administered 2016-06-01: 1 via TOPICAL

## 2016-06-01 MED ORDER — RADIAPLEXRX EX GEL
Freq: Once | CUTANEOUS | Status: AC
  Administered 2016-06-01: 16:00:00 via TOPICAL

## 2016-06-01 NOTE — Addendum Note (Signed)
Encounter addended by: Heywood Footman, RN on: 06/01/2016  4:02 PM<BR>     Documentation filed: Inpatient Patient Education, [place] Section, Notes Section

## 2016-06-01 NOTE — Progress Notes (Signed)
Late entry from 05/29/16. Oriented patient to staff and routine of the clinic. Provided patient with RADIATION THERAPY AND YOU handbook then, reviewed pertinent information. Educated patient reference potential side effects such as skin changes and fatigue. Provided patient with radiaplex and alra then, directed upon use. Provided patient with my business card and encouraged her to call with needs. Patient verbalized understanding of all reviewed.

## 2016-06-02 ENCOUNTER — Ambulatory Visit
Admission: RE | Admit: 2016-06-02 | Discharge: 2016-06-02 | Disposition: A | Discharge status: Home / Self Care | Payer: Commercial Managed Care - HMO | Source: Ambulatory Visit | Attending: Radiation Oncology | Admitting: Radiation Oncology

## 2016-06-02 DIAGNOSIS — I10 Essential (primary) hypertension: Secondary | ICD-10-CM | POA: Diagnosis not present

## 2016-06-02 DIAGNOSIS — Z170 Estrogen receptor positive status [ER+]: Secondary | ICD-10-CM | POA: Diagnosis not present

## 2016-06-02 DIAGNOSIS — G35 Multiple sclerosis: Secondary | ICD-10-CM | POA: Diagnosis not present

## 2016-06-02 DIAGNOSIS — C50412 Malignant neoplasm of upper-outer quadrant of left female breast: Secondary | ICD-10-CM | POA: Diagnosis not present

## 2016-06-02 DIAGNOSIS — Z510 Encounter for antineoplastic radiation therapy: Secondary | ICD-10-CM | POA: Diagnosis not present

## 2016-06-02 DIAGNOSIS — E785 Hyperlipidemia, unspecified: Secondary | ICD-10-CM | POA: Diagnosis not present

## 2016-06-02 DIAGNOSIS — Z8673 Personal history of transient ischemic attack (TIA), and cerebral infarction without residual deficits: Secondary | ICD-10-CM | POA: Diagnosis not present

## 2016-06-02 DIAGNOSIS — J449 Chronic obstructive pulmonary disease, unspecified: Secondary | ICD-10-CM | POA: Diagnosis not present

## 2016-06-02 DIAGNOSIS — Z79899 Other long term (current) drug therapy: Secondary | ICD-10-CM | POA: Diagnosis not present

## 2016-06-03 ENCOUNTER — Telehealth: Payer: Self-pay | Admitting: *Deleted

## 2016-06-03 ENCOUNTER — Ambulatory Visit
Admission: RE | Admit: 2016-06-03 | Discharge: 2016-06-03 | Disposition: A | Discharge status: Home / Self Care | Payer: Commercial Managed Care - HMO | Source: Ambulatory Visit | Attending: Radiation Oncology | Admitting: Radiation Oncology

## 2016-06-03 DIAGNOSIS — Z510 Encounter for antineoplastic radiation therapy: Secondary | ICD-10-CM | POA: Diagnosis not present

## 2016-06-03 DIAGNOSIS — G35 Multiple sclerosis: Secondary | ICD-10-CM | POA: Diagnosis not present

## 2016-06-03 DIAGNOSIS — E785 Hyperlipidemia, unspecified: Secondary | ICD-10-CM | POA: Diagnosis not present

## 2016-06-03 DIAGNOSIS — C50412 Malignant neoplasm of upper-outer quadrant of left female breast: Secondary | ICD-10-CM | POA: Diagnosis not present

## 2016-06-03 DIAGNOSIS — Z8673 Personal history of transient ischemic attack (TIA), and cerebral infarction without residual deficits: Secondary | ICD-10-CM | POA: Diagnosis not present

## 2016-06-03 DIAGNOSIS — Z170 Estrogen receptor positive status [ER+]: Secondary | ICD-10-CM | POA: Diagnosis not present

## 2016-06-03 DIAGNOSIS — J449 Chronic obstructive pulmonary disease, unspecified: Secondary | ICD-10-CM | POA: Diagnosis not present

## 2016-06-03 DIAGNOSIS — Z79899 Other long term (current) drug therapy: Secondary | ICD-10-CM | POA: Diagnosis not present

## 2016-06-03 DIAGNOSIS — I10 Essential (primary) hypertension: Secondary | ICD-10-CM | POA: Diagnosis not present

## 2016-06-03 NOTE — Telephone Encounter (Signed)
  Oncology Nurse Navigator Documentation  Navigator Location: [facility] (06/03/16 1500) Navigator Encounter Type: Telephone (06/03/16 1500) Telephone: Outgoing Call (06/03/16 1500)         Patient Visit Type: RadOnc (06/03/16 1500) Treatment Phase: First Radiation Tx (06/03/16 1500)                            Time Spent with Patient: 15 (06/03/16 1500)

## 2016-06-04 ENCOUNTER — Ambulatory Visit
Admission: RE | Admit: 2016-06-04 | Discharge: 2016-06-04 | Disposition: A | Discharge status: Home / Self Care | Payer: Commercial Managed Care - HMO | Source: Ambulatory Visit | Attending: Radiation Oncology | Admitting: Radiation Oncology

## 2016-06-04 DIAGNOSIS — C50412 Malignant neoplasm of upper-outer quadrant of left female breast: Secondary | ICD-10-CM | POA: Diagnosis not present

## 2016-06-04 DIAGNOSIS — I10 Essential (primary) hypertension: Secondary | ICD-10-CM | POA: Diagnosis not present

## 2016-06-04 DIAGNOSIS — Z170 Estrogen receptor positive status [ER+]: Secondary | ICD-10-CM | POA: Diagnosis not present

## 2016-06-04 DIAGNOSIS — G35 Multiple sclerosis: Secondary | ICD-10-CM | POA: Diagnosis not present

## 2016-06-04 DIAGNOSIS — Z510 Encounter for antineoplastic radiation therapy: Secondary | ICD-10-CM | POA: Diagnosis not present

## 2016-06-04 DIAGNOSIS — E785 Hyperlipidemia, unspecified: Secondary | ICD-10-CM | POA: Diagnosis not present

## 2016-06-04 DIAGNOSIS — Z79899 Other long term (current) drug therapy: Secondary | ICD-10-CM | POA: Diagnosis not present

## 2016-06-04 DIAGNOSIS — Z8673 Personal history of transient ischemic attack (TIA), and cerebral infarction without residual deficits: Secondary | ICD-10-CM | POA: Diagnosis not present

## 2016-06-04 DIAGNOSIS — J449 Chronic obstructive pulmonary disease, unspecified: Secondary | ICD-10-CM | POA: Diagnosis not present

## 2016-06-05 ENCOUNTER — Encounter: Payer: Self-pay | Admitting: Radiation Oncology

## 2016-06-05 ENCOUNTER — Ambulatory Visit
Admission: RE | Admit: 2016-06-05 | Discharge: 2016-06-05 | Disposition: A | Discharge status: Home / Self Care | Payer: Commercial Managed Care - HMO | Source: Ambulatory Visit | Attending: Radiation Oncology | Admitting: Radiation Oncology

## 2016-06-05 VITALS — BP 140/97 | HR 67 | Resp 16 | Wt 203.3 lb

## 2016-06-05 DIAGNOSIS — C50412 Malignant neoplasm of upper-outer quadrant of left female breast: Secondary | ICD-10-CM

## 2016-06-05 DIAGNOSIS — G35 Multiple sclerosis: Secondary | ICD-10-CM | POA: Diagnosis not present

## 2016-06-05 DIAGNOSIS — J449 Chronic obstructive pulmonary disease, unspecified: Secondary | ICD-10-CM | POA: Diagnosis not present

## 2016-06-05 DIAGNOSIS — E785 Hyperlipidemia, unspecified: Secondary | ICD-10-CM | POA: Diagnosis not present

## 2016-06-05 DIAGNOSIS — Z79899 Other long term (current) drug therapy: Secondary | ICD-10-CM | POA: Diagnosis not present

## 2016-06-05 DIAGNOSIS — Z8673 Personal history of transient ischemic attack (TIA), and cerebral infarction without residual deficits: Secondary | ICD-10-CM | POA: Diagnosis not present

## 2016-06-05 DIAGNOSIS — I10 Essential (primary) hypertension: Secondary | ICD-10-CM | POA: Diagnosis not present

## 2016-06-05 DIAGNOSIS — Z510 Encounter for antineoplastic radiation therapy: Secondary | ICD-10-CM | POA: Diagnosis not present

## 2016-06-05 DIAGNOSIS — Z170 Estrogen receptor positive status [ER+]: Secondary | ICD-10-CM | POA: Diagnosis not present

## 2016-06-05 NOTE — Progress Notes (Signed)
  Radiation Oncology         [phone]   Name: Michele Page MRN: [id number]   Date: 06/05/2016  DOB: 1956/01/13   Weekly Radiation Therapy Management    Current Dose: 14.4 Gy  Planned Dose:  60.4 Gy  Narrative The patient presents for routine under treatment assessment. Weight and vitals stable. Denies pain. Faint hyperpigmentation of left/treated breast noted at nipple. Slight edema of left breast noted. Denies nipple discharge. Reports moderate fatigue.   The patient is without complaint. Set-up films were reviewed. The chart was checked.  Physical Findings  weight is 203 lb 4.8 oz (92.216 kg). Her blood pressure is 140/97 and her pulse is 67. Her respiration is 16 and oxygen saturation is 100%. . Weight essentially stable.  No significant changes.  Impression The patient is tolerating radiation.  Plan Continue treatment as planned.         Sheral Apley Michele Page, M.D.  This document serves as a record of services personally performed by Tyler Pita, MD. It was created on his behalf by Truddie Hidden, a trained medical scribe. The creation of this record is based on the scribe's personal observations and the provider's statements to them. This document has been checked and approved by the attending provider.

## 2016-06-05 NOTE — Progress Notes (Signed)
Weight and vitals stable. Denies pain. Faint hyperpigmentation of left/treated breast noted at nipple. Slight edema of left breast noted. Denies nipple discharge. Reports moderate fatigue.   BP 140/97 mmHg  Pulse 67  Resp 16  Wt 203 lb 4.8 oz (92.216 kg)  SpO2 100% Wt Readings from Last 3 Encounters:  06/05/16 203 lb 4.8 oz (92.216 kg)  05/29/16 203 lb 12.8 oz (92.443 kg)  05/15/16 205 lb 8 oz (93.214 kg)

## 2016-06-08 ENCOUNTER — Ambulatory Visit
Admission: RE | Admit: 2016-06-08 | Discharge: 2016-06-08 | Disposition: A | Discharge status: Home / Self Care | Payer: Commercial Managed Care - HMO | Source: Ambulatory Visit | Attending: Radiation Oncology | Admitting: Radiation Oncology

## 2016-06-08 DIAGNOSIS — C50412 Malignant neoplasm of upper-outer quadrant of left female breast: Secondary | ICD-10-CM | POA: Diagnosis not present

## 2016-06-08 DIAGNOSIS — Z79899 Other long term (current) drug therapy: Secondary | ICD-10-CM | POA: Diagnosis not present

## 2016-06-08 DIAGNOSIS — I10 Essential (primary) hypertension: Secondary | ICD-10-CM | POA: Diagnosis not present

## 2016-06-08 DIAGNOSIS — E785 Hyperlipidemia, unspecified: Secondary | ICD-10-CM | POA: Diagnosis not present

## 2016-06-08 DIAGNOSIS — Z8673 Personal history of transient ischemic attack (TIA), and cerebral infarction without residual deficits: Secondary | ICD-10-CM | POA: Diagnosis not present

## 2016-06-08 DIAGNOSIS — G35 Multiple sclerosis: Secondary | ICD-10-CM | POA: Diagnosis not present

## 2016-06-08 DIAGNOSIS — Z510 Encounter for antineoplastic radiation therapy: Secondary | ICD-10-CM | POA: Diagnosis not present

## 2016-06-08 DIAGNOSIS — J449 Chronic obstructive pulmonary disease, unspecified: Secondary | ICD-10-CM | POA: Diagnosis not present

## 2016-06-08 DIAGNOSIS — Z170 Estrogen receptor positive status [ER+]: Secondary | ICD-10-CM | POA: Diagnosis not present

## 2016-06-09 ENCOUNTER — Ambulatory Visit
Admission: RE | Admit: 2016-06-09 | Discharge: 2016-06-09 | Disposition: A | Discharge status: Home / Self Care | Payer: Commercial Managed Care - HMO | Source: Ambulatory Visit | Attending: Radiation Oncology | Admitting: Radiation Oncology

## 2016-06-09 ENCOUNTER — Ambulatory Visit (INDEPENDENT_AMBULATORY_CARE_PROVIDER_SITE_OTHER): Payer: Commercial Managed Care - HMO | Admitting: Pharmacist

## 2016-06-09 VITALS — BP 152/84 | HR 70 | Ht 63.0 in | Wt 202.6 lb

## 2016-06-09 DIAGNOSIS — I10 Essential (primary) hypertension: Secondary | ICD-10-CM | POA: Diagnosis not present

## 2016-06-09 DIAGNOSIS — J449 Chronic obstructive pulmonary disease, unspecified: Secondary | ICD-10-CM | POA: Diagnosis not present

## 2016-06-09 DIAGNOSIS — G35 Multiple sclerosis: Secondary | ICD-10-CM | POA: Diagnosis not present

## 2016-06-09 DIAGNOSIS — Z510 Encounter for antineoplastic radiation therapy: Secondary | ICD-10-CM | POA: Diagnosis not present

## 2016-06-09 DIAGNOSIS — Z170 Estrogen receptor positive status [ER+]: Secondary | ICD-10-CM | POA: Diagnosis not present

## 2016-06-09 DIAGNOSIS — Z79899 Other long term (current) drug therapy: Secondary | ICD-10-CM | POA: Diagnosis not present

## 2016-06-09 DIAGNOSIS — E785 Hyperlipidemia, unspecified: Secondary | ICD-10-CM | POA: Diagnosis not present

## 2016-06-09 DIAGNOSIS — C50412 Malignant neoplasm of upper-outer quadrant of left female breast: Secondary | ICD-10-CM | POA: Diagnosis not present

## 2016-06-09 DIAGNOSIS — Z8673 Personal history of transient ischemic attack (TIA), and cerebral infarction without residual deficits: Secondary | ICD-10-CM | POA: Diagnosis not present

## 2016-06-09 MED ORDER — AMLODIPINE BESYLATE 10 MG PO TABS
10.0000 mg | ORAL_TABLET | Freq: Every day | ORAL | 5 refills | Status: DC
Start: 2016-06-09 — End: 2016-07-01

## 2016-06-09 NOTE — Progress Notes (Signed)
Patient ID: Michele Page                 DOB: 1956/05/08                      MRN: [id number]     HPI: Michele Page is a 60 y.o. female patient of Dr. Gwenlyn Found whith PMH below who presents today for hypertension follow up. She reports having high blood pressure since her early 40s. She was recently diagnosed with breast cancer and is currently undergoing radiation treatment.   At her last visit with Tommy Medal her nifedipine was changed to amlodipine. Erasmo Downer had asked her to start with 5mg  x one week then increase to 10mg  daily in the evening. The patient reports that she took the new medication for only a few days and only took 1 dose of the 10mg  because it did not work. She reports that her blood pressures ran in the 150s with the new medication and that was higher than she was comfortable with so she changed back to the nifedipine.   She reports that she would like to have the nifedipine changed because she believes this is causing her heart to race. She states that she feels her heart race about an hour after each dose of nifedipine.   She reports she believes that her pressures run high between 6-8pm. She is checking her pressure greater than 5 times a day.   Cardiac Hx: stroke 4 years ago (age 55) with some residual expressive aphasia, HTN  Family Hx: mother died from stroke at 58, sister from stroke in her 41's; both believed to be related to ongoing hypertension; father died from MI at 7;  She has 5 brothers (2 still living), but none had/have cardiovascular issues  Social Hx: quit tobacco 15+ years ago; only rare alcohol, drinks 1 cup coffee per day at most  Diet: eats almost all meals at home, only small amounts of salt used.  Currently eating larger portions of vegetables, cut meats out and eats only chicken  Exercise: gym 5 days per week; stays about 2 hours doing both cardio and strength training  Home BP readings: average 130s/mid80s - 6 over last 17 measurements [id number]  systolic with 3 [id number]. She also had several readings <110 and 1 <100.   She denies symptoms of low blood pressure. She also denies head aches, chest pain or shortness of breath.   Current antihypertensive medications:                        Labetolol 600 mg tid  (8 am, 3 pm, 9 pm)                       Losartan 100 mg qd (8 am)                       Nifedipine XL 60 mg bid (3 pm, 8 pm)  Wt Readings from Last 3 Encounters:  06/05/16 203 lb 4.8 oz (92.2 kg)  05/29/16 203 lb 12.8 oz (92.4 kg)  05/15/16 205 lb 8 oz (93.2 kg)   BP Readings from Last 3 Encounters:  06/05/16 (!) 140/97  05/29/16 (!) 147/79  05/15/16 136/88   Pulse Readings from Last 3 Encounters:  06/05/16 67  05/29/16 64  05/15/16 72    Renal function: CrCl cannot be calculated (Patient's most recent lab result is older than the  maximum 21 days allowed.).  Past Medical History:  Diagnosis Date  . Arthritis    lt knee  . Breast cancer ([place])   . Breast cancer of upper-outer quadrant of left female breast ([place]) 04/02/2016  . Colon polyps   . COPD (chronic obstructive pulmonary disease) ([place])   . Hyperlipidemia   . Hypertension   . Multiple sclerosis ([place])   . Stroke ([place])    2013, mild cognitive deficits  . Vision abnormalities     Current Outpatient Prescriptions on File Prior to Visit  Medication Sig Dispense Refill  . amLODipine (NORVASC) 10 MG tablet Take 1 tablet (10 mg total) by mouth daily. (Patient not taking: Reported on 05/15/2016) 30 tablet 5  . aspirin EC 81 MG tablet Take 1 tablet (81 mg total) by mouth daily. 30 tablet 6  . ibuprofen (ADVIL,MOTRIN) 600 MG tablet Take 1 tablet (600 mg total) by mouth every 6 (six) hours as needed. 30 tablet 0  . labetalol (NORMODYNE) 300 MG tablet Take 2 tablets (600 mg total) by mouth 3 (three) times daily. 180 tablet 4  . losartan (COZAAR) 100 MG tablet TAKE ONE TABLET BY MOUTH ONCE DAILY FOR BLOOD PRESSURE 90 tablet 0  . NIFEdipine (PROCARDIA-XL/ADALAT CC) 60  MG 24 hr tablet     . pravastatin (PRAVACHOL) 10 MG tablet TAKE ONE TABLET BY MOUTH AT BEDTIME 30 tablet 0  . zolpidem (AMBIEN) 5 MG tablet Take 1 tablet (5 mg total) by mouth at bedtime as needed for sleep. 15 tablet 1   No current facility-administered medications on file prior to visit.     No Known Allergies  Vital Signs - 152/84  70   Assessment/Plan: Hypertension: BP is not at goal today. I believe she did not given amlodipine an appropriate trial especially given she only took 1 dose of the higher strength. She is concerned about switching medications because she states that medications do not seem to work on her and she is concerned about spending more money on additional medications. After lengthy discussion she decided that she would like to do a retrial on amlodipine and stop nifedipine. We also discussed potentially changing her losartan to valsartan or irbesartan for additional control without increasing pill burden, but will defer at this time since she just got a 90 day supply and we are making other medication changes today. Instructed her that we would allow her pressure to run slightly higher while amlodipine builds in her system. She was extremely concerned about this so told her to call if her pressure trended to 180 or she developed symptoms. She will follow up in hypertension clinic in 3 weeks. Have asked she write her pressure on log provided and bring to next visit.   Thank you, Lelan Pons. Patterson Hammersmith, [place]

## 2016-06-09 NOTE — Patient Instructions (Signed)
Return for a a follow up appointment in 3 weeks  Your blood pressure today is 152/84  Check your blood pressure at home daily (if able) and keep record of the readings.  Take your BP meds as follows: Start taking amlodipine 5mg  (1/2 tablet) for 7 days then increase to 10mg  (1 tablet) once daily  Stop taking nifedipine  Blood pressures may be higher until the amlodipine starts working in your system. If you pressures trend above 180 call our office.   Bring all of your meds, your BP cuff and your record of home blood pressures to your next appointment.  Exercise as you're able, try to walk approximately 30 minutes per day.  Keep salt intake to a minimum, especially watch canned and prepared boxed foods.  Eat more fresh fruits and vegetables and fewer canned items.  Avoid eating in fast food restaurants.    HOW TO TAKE YOUR BLOOD PRESSURE: . Rest 5 minutes before taking your blood pressure. .  Don't smoke or drink caffeinated beverages for at least 30 minutes before. . Take your blood pressure before (not after) you eat. . Sit comfortably with your back supported and both feet on the floor (don't cross your legs). . Elevate your arm to heart level on a table or a desk. . Use the proper sized cuff. It should fit smoothly and snugly around your bare upper arm. There should be enough room to slip a fingertip under the cuff. The bottom edge of the cuff should be 1 inch above the crease of the elbow. . Ideally, take 3 measurements at one sitting and record the average.

## 2016-06-10 ENCOUNTER — Ambulatory Visit
Admission: RE | Admit: 2016-06-10 | Discharge: 2016-06-10 | Disposition: A | Discharge status: Home / Self Care | Payer: Commercial Managed Care - HMO | Source: Ambulatory Visit | Attending: Radiation Oncology | Admitting: Radiation Oncology

## 2016-06-10 ENCOUNTER — Encounter: Payer: Self-pay | Admitting: Pharmacist

## 2016-06-10 DIAGNOSIS — Z170 Estrogen receptor positive status [ER+]: Secondary | ICD-10-CM | POA: Diagnosis not present

## 2016-06-10 DIAGNOSIS — I10 Essential (primary) hypertension: Secondary | ICD-10-CM | POA: Diagnosis not present

## 2016-06-10 DIAGNOSIS — E785 Hyperlipidemia, unspecified: Secondary | ICD-10-CM | POA: Diagnosis not present

## 2016-06-10 DIAGNOSIS — J449 Chronic obstructive pulmonary disease, unspecified: Secondary | ICD-10-CM | POA: Diagnosis not present

## 2016-06-10 DIAGNOSIS — Z510 Encounter for antineoplastic radiation therapy: Secondary | ICD-10-CM | POA: Diagnosis not present

## 2016-06-10 DIAGNOSIS — G35 Multiple sclerosis: Secondary | ICD-10-CM | POA: Diagnosis not present

## 2016-06-10 DIAGNOSIS — Z79899 Other long term (current) drug therapy: Secondary | ICD-10-CM | POA: Diagnosis not present

## 2016-06-10 DIAGNOSIS — Z8673 Personal history of transient ischemic attack (TIA), and cerebral infarction without residual deficits: Secondary | ICD-10-CM | POA: Diagnosis not present

## 2016-06-10 DIAGNOSIS — C50412 Malignant neoplasm of upper-outer quadrant of left female breast: Secondary | ICD-10-CM | POA: Diagnosis not present

## 2016-06-10 NOTE — Assessment & Plan Note (Signed)
BP is not at goal today. I believe she did not given amlodipine an appropriate trial especially given she only took 1 dose of the higher strength. She is concerned about switching medications because she states that medications do not seem to work on her and she is concerned about spending more money on additional medications. After lengthy discussion she decided that she would like to do a retrial on amlodipine and stop nifedipine. We also discussed potentially changing her losartan to valsartan or irbesartan for additional control without increasing pill burden, but will defer at this time since she just got a 90 day supply and we are making other medication changes today. Instructed her that we would allow her pressure to run slightly higher while amlodipine builds in her system. She was extremely concerned about this so told her to call if her pressure trended to 180 or she developed symptoms. She will follow up in hypertension clinic in 3 weeks. Have asked she write her pressure on log provided and bring to next visit.

## 2016-06-11 ENCOUNTER — Ambulatory Visit
Admission: RE | Admit: 2016-06-11 | Discharge: 2016-06-11 | Disposition: A | Discharge status: Home / Self Care | Payer: Commercial Managed Care - HMO | Source: Ambulatory Visit | Attending: Radiation Oncology | Admitting: Radiation Oncology

## 2016-06-11 DIAGNOSIS — E785 Hyperlipidemia, unspecified: Secondary | ICD-10-CM | POA: Diagnosis not present

## 2016-06-11 DIAGNOSIS — J449 Chronic obstructive pulmonary disease, unspecified: Secondary | ICD-10-CM | POA: Diagnosis not present

## 2016-06-11 DIAGNOSIS — Z8673 Personal history of transient ischemic attack (TIA), and cerebral infarction without residual deficits: Secondary | ICD-10-CM | POA: Diagnosis not present

## 2016-06-11 DIAGNOSIS — C50412 Malignant neoplasm of upper-outer quadrant of left female breast: Secondary | ICD-10-CM | POA: Diagnosis not present

## 2016-06-11 DIAGNOSIS — Z79899 Other long term (current) drug therapy: Secondary | ICD-10-CM | POA: Diagnosis not present

## 2016-06-11 DIAGNOSIS — Z510 Encounter for antineoplastic radiation therapy: Secondary | ICD-10-CM | POA: Diagnosis not present

## 2016-06-11 DIAGNOSIS — Z170 Estrogen receptor positive status [ER+]: Secondary | ICD-10-CM | POA: Diagnosis not present

## 2016-06-11 DIAGNOSIS — G35 Multiple sclerosis: Secondary | ICD-10-CM | POA: Diagnosis not present

## 2016-06-11 DIAGNOSIS — I10 Essential (primary) hypertension: Secondary | ICD-10-CM | POA: Diagnosis not present

## 2016-06-12 ENCOUNTER — Ambulatory Visit
Admission: RE | Admit: 2016-06-12 | Discharge: 2016-06-12 | Disposition: A | Discharge status: Home / Self Care | Payer: Commercial Managed Care - HMO | Source: Ambulatory Visit | Attending: Radiation Oncology | Admitting: Radiation Oncology

## 2016-06-12 VITALS — BP 113/78 | HR 72 | Resp 16 | Wt 201.1 lb

## 2016-06-12 DIAGNOSIS — C50412 Malignant neoplasm of upper-outer quadrant of left female breast: Secondary | ICD-10-CM | POA: Diagnosis not present

## 2016-06-12 DIAGNOSIS — Z79899 Other long term (current) drug therapy: Secondary | ICD-10-CM | POA: Diagnosis not present

## 2016-06-12 DIAGNOSIS — E785 Hyperlipidemia, unspecified: Secondary | ICD-10-CM | POA: Diagnosis not present

## 2016-06-12 DIAGNOSIS — J449 Chronic obstructive pulmonary disease, unspecified: Secondary | ICD-10-CM | POA: Diagnosis not present

## 2016-06-12 DIAGNOSIS — Z510 Encounter for antineoplastic radiation therapy: Secondary | ICD-10-CM | POA: Diagnosis not present

## 2016-06-12 DIAGNOSIS — Z170 Estrogen receptor positive status [ER+]: Secondary | ICD-10-CM | POA: Diagnosis not present

## 2016-06-12 DIAGNOSIS — I10 Essential (primary) hypertension: Secondary | ICD-10-CM | POA: Diagnosis not present

## 2016-06-12 DIAGNOSIS — Z8673 Personal history of transient ischemic attack (TIA), and cerebral infarction without residual deficits: Secondary | ICD-10-CM | POA: Diagnosis not present

## 2016-06-12 DIAGNOSIS — G35 Multiple sclerosis: Secondary | ICD-10-CM | POA: Diagnosis not present

## 2016-06-12 NOTE — Progress Notes (Signed)
  Radiation Oncology         [phone]   Name: Michele Page MRN: [id number]   Date: 06/12/2016  DOB: December 08, 1955   Weekly Radiation Therapy Management    Current Dose: 23.4 Gy  Planned Dose:  60.4 Gy  Narrative The patient presents for routine under treatment assessment. Weight and vitals stable. Weight and vitals stable. Denies pain. Faint hyperpigmentation of left/treated breast noted at nipple. Reports using radiaplex as directed. Slight edema of left breast noted. Denies nipple discharge. Reports moderate fatigue.     The patient is without complaint. Set-up films were reviewed. The chart was checked.  Physical Findings  weight is 201 lb 1.6 oz (91.2 kg). Her blood pressure is 113/78 and her pulse is 72. Her respiration is 16 and oxygen saturation is 100%. . Weight essentially stable.  No significant changes. The patient has some mild  hyperpigmentation throughout .  No desquamation noted.   Impression The patient is tolerating radiation.  Plan Continue treatment as planned.         Sheral Apley Tammi Klippel, M.D.  This document serves as a record of services personally performed by Tyler Pita, MD. It was created on his behalf by Truddie Hidden, a trained medical scribe. The creation of this record is based on the scribe's personal observations and the provider's statements to them. This document has been checked and approved by the attending provider.

## 2016-06-12 NOTE — Progress Notes (Signed)
Weight and vitals stable. Denies pain. Faint hyperpigmentation of left/treated breast noted at nipple. Reports using radiaplex as directed. Slight edema of left breast noted. Denies nipple discharge. Reports moderate fatigue.   BP 113/78   Pulse 72   Resp 16   Wt 201 lb 1.6 oz (91.2 kg)   SpO2 100%   BMI 35.62 kg/m  Wt Readings from Last 3 Encounters:  06/12/16 201 lb 1.6 oz (91.2 kg)  06/09/16 202 lb 9.6 oz (91.9 kg)  06/05/16 203 lb 4.8 oz (92.2 kg)

## 2016-06-15 ENCOUNTER — Ambulatory Visit
Admission: RE | Admit: 2016-06-15 | Discharge: 2016-06-15 | Disposition: A | Discharge status: Home / Self Care | Payer: Commercial Managed Care - HMO | Source: Ambulatory Visit | Attending: Radiation Oncology | Admitting: Radiation Oncology

## 2016-06-15 ENCOUNTER — Other Ambulatory Visit: Payer: Self-pay | Admitting: Internal Medicine

## 2016-06-15 DIAGNOSIS — G35 Multiple sclerosis: Secondary | ICD-10-CM | POA: Diagnosis not present

## 2016-06-15 DIAGNOSIS — C50412 Malignant neoplasm of upper-outer quadrant of left female breast: Secondary | ICD-10-CM | POA: Diagnosis not present

## 2016-06-15 DIAGNOSIS — I10 Essential (primary) hypertension: Secondary | ICD-10-CM | POA: Diagnosis not present

## 2016-06-15 DIAGNOSIS — Z510 Encounter for antineoplastic radiation therapy: Secondary | ICD-10-CM | POA: Diagnosis not present

## 2016-06-15 DIAGNOSIS — Z79899 Other long term (current) drug therapy: Secondary | ICD-10-CM | POA: Diagnosis not present

## 2016-06-15 DIAGNOSIS — Z170 Estrogen receptor positive status [ER+]: Secondary | ICD-10-CM | POA: Diagnosis not present

## 2016-06-15 DIAGNOSIS — Z8673 Personal history of transient ischemic attack (TIA), and cerebral infarction without residual deficits: Secondary | ICD-10-CM | POA: Diagnosis not present

## 2016-06-15 DIAGNOSIS — J449 Chronic obstructive pulmonary disease, unspecified: Secondary | ICD-10-CM | POA: Diagnosis not present

## 2016-06-15 DIAGNOSIS — E785 Hyperlipidemia, unspecified: Secondary | ICD-10-CM | POA: Diagnosis not present

## 2016-06-16 ENCOUNTER — Ambulatory Visit
Admission: RE | Admit: 2016-06-16 | Discharge: 2016-06-16 | Disposition: A | Discharge status: Home / Self Care | Payer: Commercial Managed Care - HMO | Source: Ambulatory Visit | Attending: Radiation Oncology | Admitting: Radiation Oncology

## 2016-06-16 DIAGNOSIS — Z8673 Personal history of transient ischemic attack (TIA), and cerebral infarction without residual deficits: Secondary | ICD-10-CM | POA: Diagnosis not present

## 2016-06-16 DIAGNOSIS — J449 Chronic obstructive pulmonary disease, unspecified: Secondary | ICD-10-CM | POA: Diagnosis not present

## 2016-06-16 DIAGNOSIS — I10 Essential (primary) hypertension: Secondary | ICD-10-CM | POA: Diagnosis not present

## 2016-06-16 DIAGNOSIS — Z170 Estrogen receptor positive status [ER+]: Secondary | ICD-10-CM | POA: Diagnosis not present

## 2016-06-16 DIAGNOSIS — Z79899 Other long term (current) drug therapy: Secondary | ICD-10-CM | POA: Diagnosis not present

## 2016-06-16 DIAGNOSIS — E785 Hyperlipidemia, unspecified: Secondary | ICD-10-CM | POA: Diagnosis not present

## 2016-06-16 DIAGNOSIS — G35 Multiple sclerosis: Secondary | ICD-10-CM | POA: Diagnosis not present

## 2016-06-16 DIAGNOSIS — Z510 Encounter for antineoplastic radiation therapy: Secondary | ICD-10-CM | POA: Diagnosis not present

## 2016-06-16 DIAGNOSIS — C50412 Malignant neoplasm of upper-outer quadrant of left female breast: Secondary | ICD-10-CM | POA: Diagnosis not present

## 2016-06-17 ENCOUNTER — Ambulatory Visit
Admission: RE | Admit: 2016-06-17 | Discharge: 2016-06-17 | Disposition: A | Discharge status: Home / Self Care | Payer: Commercial Managed Care - HMO | Source: Ambulatory Visit | Attending: Radiation Oncology | Admitting: Radiation Oncology

## 2016-06-17 DIAGNOSIS — Z79899 Other long term (current) drug therapy: Secondary | ICD-10-CM | POA: Diagnosis not present

## 2016-06-17 DIAGNOSIS — J449 Chronic obstructive pulmonary disease, unspecified: Secondary | ICD-10-CM | POA: Diagnosis not present

## 2016-06-17 DIAGNOSIS — Z510 Encounter for antineoplastic radiation therapy: Secondary | ICD-10-CM | POA: Diagnosis not present

## 2016-06-17 DIAGNOSIS — Z8673 Personal history of transient ischemic attack (TIA), and cerebral infarction without residual deficits: Secondary | ICD-10-CM | POA: Diagnosis not present

## 2016-06-17 DIAGNOSIS — I10 Essential (primary) hypertension: Secondary | ICD-10-CM | POA: Diagnosis not present

## 2016-06-17 DIAGNOSIS — G35 Multiple sclerosis: Secondary | ICD-10-CM | POA: Diagnosis not present

## 2016-06-17 DIAGNOSIS — E785 Hyperlipidemia, unspecified: Secondary | ICD-10-CM | POA: Diagnosis not present

## 2016-06-17 DIAGNOSIS — Z170 Estrogen receptor positive status [ER+]: Secondary | ICD-10-CM | POA: Diagnosis not present

## 2016-06-17 DIAGNOSIS — C50412 Malignant neoplasm of upper-outer quadrant of left female breast: Secondary | ICD-10-CM | POA: Diagnosis not present

## 2016-06-18 ENCOUNTER — Ambulatory Visit
Admission: RE | Admit: 2016-06-18 | Discharge: 2016-06-18 | Disposition: A | Discharge status: Home / Self Care | Payer: Commercial Managed Care - HMO | Source: Ambulatory Visit | Attending: Radiation Oncology | Admitting: Radiation Oncology

## 2016-06-18 DIAGNOSIS — Z8673 Personal history of transient ischemic attack (TIA), and cerebral infarction without residual deficits: Secondary | ICD-10-CM | POA: Diagnosis not present

## 2016-06-18 DIAGNOSIS — J449 Chronic obstructive pulmonary disease, unspecified: Secondary | ICD-10-CM | POA: Diagnosis not present

## 2016-06-18 DIAGNOSIS — Z79899 Other long term (current) drug therapy: Secondary | ICD-10-CM | POA: Diagnosis not present

## 2016-06-18 DIAGNOSIS — Z510 Encounter for antineoplastic radiation therapy: Secondary | ICD-10-CM | POA: Diagnosis not present

## 2016-06-18 DIAGNOSIS — E785 Hyperlipidemia, unspecified: Secondary | ICD-10-CM | POA: Diagnosis not present

## 2016-06-18 DIAGNOSIS — Z170 Estrogen receptor positive status [ER+]: Secondary | ICD-10-CM | POA: Diagnosis not present

## 2016-06-18 DIAGNOSIS — C50412 Malignant neoplasm of upper-outer quadrant of left female breast: Secondary | ICD-10-CM | POA: Diagnosis not present

## 2016-06-18 DIAGNOSIS — G35 Multiple sclerosis: Secondary | ICD-10-CM | POA: Diagnosis not present

## 2016-06-18 DIAGNOSIS — I10 Essential (primary) hypertension: Secondary | ICD-10-CM | POA: Diagnosis not present

## 2016-06-19 ENCOUNTER — Ambulatory Visit
Admission: RE | Admit: 2016-06-19 | Discharge: 2016-06-19 | Disposition: A | Discharge status: Home / Self Care | Payer: Commercial Managed Care - HMO | Source: Ambulatory Visit | Attending: Radiation Oncology | Admitting: Radiation Oncology

## 2016-06-19 VITALS — BP 121/56 | HR 94 | Resp 16 | Wt 204.1 lb

## 2016-06-19 DIAGNOSIS — G35 Multiple sclerosis: Secondary | ICD-10-CM | POA: Diagnosis not present

## 2016-06-19 DIAGNOSIS — Z510 Encounter for antineoplastic radiation therapy: Secondary | ICD-10-CM | POA: Diagnosis not present

## 2016-06-19 DIAGNOSIS — Z79899 Other long term (current) drug therapy: Secondary | ICD-10-CM | POA: Diagnosis not present

## 2016-06-19 DIAGNOSIS — I10 Essential (primary) hypertension: Secondary | ICD-10-CM | POA: Diagnosis not present

## 2016-06-19 DIAGNOSIS — E785 Hyperlipidemia, unspecified: Secondary | ICD-10-CM | POA: Diagnosis not present

## 2016-06-19 DIAGNOSIS — C50412 Malignant neoplasm of upper-outer quadrant of left female breast: Secondary | ICD-10-CM

## 2016-06-19 DIAGNOSIS — J449 Chronic obstructive pulmonary disease, unspecified: Secondary | ICD-10-CM | POA: Diagnosis not present

## 2016-06-19 DIAGNOSIS — Z170 Estrogen receptor positive status [ER+]: Secondary | ICD-10-CM | POA: Diagnosis not present

## 2016-06-19 DIAGNOSIS — Z8673 Personal history of transient ischemic attack (TIA), and cerebral infarction without residual deficits: Secondary | ICD-10-CM | POA: Diagnosis not present

## 2016-06-19 NOTE — Progress Notes (Signed)
Weight and vitals stable. Denies pain. Faint hyperpigmentation of left/treated breast noted at nipple. Reports using radiaplex as directed. Slight edema of left breast noted. Denies nipple discharge. Reports moderate fatigue.  BP (!) 121/56   Pulse 94   Resp 16   Wt 204 lb 1.6 oz (92.6 kg)   SpO2 100%   BMI 36.15 kg/m  Wt Readings from Last 3 Encounters:  06/19/16 204 lb 1.6 oz (92.6 kg)  06/12/16 201 lb 1.6 oz (91.2 kg)  06/09/16 202 lb 9.6 oz (91.9 kg)

## 2016-06-19 NOTE — Progress Notes (Signed)
  Radiation Oncology         [phone]   Name: Michele Page MRN: [id number]   Date: 06/19/2016  DOB: [birth year]   Weekly Radiation Therapy Management    Current Dose: 32.4 Gy  Planned Dose:  60.4 Gy  Narrative The patient presents for routine under treatment assessment.  Weight and vitals stable. Denies pain. Faint hyperpigmentation of left/treated breast noted at nipple. Reports using radiaplex as directed. Slight edema of left breast noted. Denies nipple discharge. Reports moderate fatigue.  The patient is without complaint. Set-up films were reviewed. The chart was checked.  Physical Findings  weight is 204 lb 1.6 oz (92.6 kg). Her blood pressure is 121/56 (abnormal) and her pulse is 94. Her respiration is 16 and oxygen saturation is 100%. . Weight essentially stable.  No significant changes. The patient has some mild  hyperpigmentation throughout .  No desquamation noted.  Impression The patient is tolerating radiation.  Plan Continue treatment as planned.         Sheral Apley Tammi Klippel, M.D.    This document serves as a record of services personally performed by Tyler Pita, MD. It was created on his behalf by Lendon Collar, a trained medical scribe. The creation of this record is based on the scribe's personal observations and the provider's statements to them. This document has been checked and approved by the attending provider.

## 2016-06-22 ENCOUNTER — Ambulatory Visit
Admission: RE | Admit: 2016-06-22 | Discharge: 2016-06-22 | Disposition: A | Discharge status: Home / Self Care | Payer: Commercial Managed Care - HMO | Source: Ambulatory Visit | Attending: Radiation Oncology | Admitting: Radiation Oncology

## 2016-06-22 DIAGNOSIS — E785 Hyperlipidemia, unspecified: Secondary | ICD-10-CM | POA: Diagnosis not present

## 2016-06-22 DIAGNOSIS — Z79899 Other long term (current) drug therapy: Secondary | ICD-10-CM | POA: Diagnosis not present

## 2016-06-22 DIAGNOSIS — J449 Chronic obstructive pulmonary disease, unspecified: Secondary | ICD-10-CM | POA: Diagnosis not present

## 2016-06-22 DIAGNOSIS — C50412 Malignant neoplasm of upper-outer quadrant of left female breast: Secondary | ICD-10-CM | POA: Diagnosis not present

## 2016-06-22 DIAGNOSIS — I10 Essential (primary) hypertension: Secondary | ICD-10-CM | POA: Diagnosis not present

## 2016-06-22 DIAGNOSIS — Z510 Encounter for antineoplastic radiation therapy: Secondary | ICD-10-CM | POA: Diagnosis not present

## 2016-06-22 DIAGNOSIS — Z170 Estrogen receptor positive status [ER+]: Secondary | ICD-10-CM | POA: Diagnosis not present

## 2016-06-22 DIAGNOSIS — G35 Multiple sclerosis: Secondary | ICD-10-CM | POA: Diagnosis not present

## 2016-06-22 DIAGNOSIS — Z8673 Personal history of transient ischemic attack (TIA), and cerebral infarction without residual deficits: Secondary | ICD-10-CM | POA: Diagnosis not present

## 2016-06-23 ENCOUNTER — Ambulatory Visit
Admission: RE | Admit: 2016-06-23 | Discharge: 2016-06-23 | Disposition: A | Discharge status: Home / Self Care | Payer: Commercial Managed Care - HMO | Source: Ambulatory Visit | Attending: Radiation Oncology | Admitting: Radiation Oncology

## 2016-06-23 DIAGNOSIS — Z170 Estrogen receptor positive status [ER+]: Secondary | ICD-10-CM | POA: Diagnosis not present

## 2016-06-23 DIAGNOSIS — Z510 Encounter for antineoplastic radiation therapy: Secondary | ICD-10-CM | POA: Diagnosis not present

## 2016-06-23 DIAGNOSIS — G35 Multiple sclerosis: Secondary | ICD-10-CM | POA: Diagnosis not present

## 2016-06-23 DIAGNOSIS — C50412 Malignant neoplasm of upper-outer quadrant of left female breast: Secondary | ICD-10-CM | POA: Diagnosis not present

## 2016-06-23 DIAGNOSIS — Z79899 Other long term (current) drug therapy: Secondary | ICD-10-CM | POA: Diagnosis not present

## 2016-06-23 DIAGNOSIS — I10 Essential (primary) hypertension: Secondary | ICD-10-CM | POA: Diagnosis not present

## 2016-06-23 DIAGNOSIS — J449 Chronic obstructive pulmonary disease, unspecified: Secondary | ICD-10-CM | POA: Diagnosis not present

## 2016-06-23 DIAGNOSIS — E785 Hyperlipidemia, unspecified: Secondary | ICD-10-CM | POA: Diagnosis not present

## 2016-06-23 DIAGNOSIS — Z8673 Personal history of transient ischemic attack (TIA), and cerebral infarction without residual deficits: Secondary | ICD-10-CM | POA: Diagnosis not present

## 2016-06-23 NOTE — Progress Notes (Signed)
Patient presented to nursing following radiation treatment requesting to be evaluated by this RN for skin changes. Faint hyperpigmentation of left/treated breast noted. Moisture noted under left breast. Patient reports moisture must be related to radiaplex. Advised patient to allow time for radiaplex to dry before dressing. Also, tiny break in skin noted left outter mammary fold. Advised patient to continue radiaplex bid to entire breast plus apply neosporin to skin break. Provided patient with telfa to absorb moisture and create barrier between clothing and bra. She verbalized understanding of all reviewed.

## 2016-06-24 ENCOUNTER — Ambulatory Visit
Admission: RE | Admit: 2016-06-24 | Discharge: 2016-06-24 | Disposition: A | Discharge status: Home / Self Care | Payer: Commercial Managed Care - HMO | Source: Ambulatory Visit | Attending: Radiation Oncology | Admitting: Radiation Oncology

## 2016-06-24 DIAGNOSIS — Z510 Encounter for antineoplastic radiation therapy: Secondary | ICD-10-CM | POA: Diagnosis not present

## 2016-06-24 DIAGNOSIS — Z8673 Personal history of transient ischemic attack (TIA), and cerebral infarction without residual deficits: Secondary | ICD-10-CM | POA: Diagnosis not present

## 2016-06-24 DIAGNOSIS — J449 Chronic obstructive pulmonary disease, unspecified: Secondary | ICD-10-CM | POA: Diagnosis not present

## 2016-06-24 DIAGNOSIS — I10 Essential (primary) hypertension: Secondary | ICD-10-CM | POA: Diagnosis not present

## 2016-06-24 DIAGNOSIS — G35 Multiple sclerosis: Secondary | ICD-10-CM | POA: Diagnosis not present

## 2016-06-24 DIAGNOSIS — Z79899 Other long term (current) drug therapy: Secondary | ICD-10-CM | POA: Diagnosis not present

## 2016-06-24 DIAGNOSIS — E785 Hyperlipidemia, unspecified: Secondary | ICD-10-CM | POA: Diagnosis not present

## 2016-06-24 DIAGNOSIS — Z170 Estrogen receptor positive status [ER+]: Secondary | ICD-10-CM | POA: Diagnosis not present

## 2016-06-24 DIAGNOSIS — C50412 Malignant neoplasm of upper-outer quadrant of left female breast: Secondary | ICD-10-CM | POA: Diagnosis not present

## 2016-06-25 ENCOUNTER — Ambulatory Visit
Admission: RE | Admit: 2016-06-25 | Discharge: 2016-06-25 | Disposition: A | Discharge status: Home / Self Care | Payer: Commercial Managed Care - HMO | Source: Ambulatory Visit | Attending: Radiation Oncology | Admitting: Radiation Oncology

## 2016-06-25 DIAGNOSIS — Z510 Encounter for antineoplastic radiation therapy: Secondary | ICD-10-CM | POA: Diagnosis not present

## 2016-06-25 DIAGNOSIS — G35 Multiple sclerosis: Secondary | ICD-10-CM | POA: Diagnosis not present

## 2016-06-25 DIAGNOSIS — I10 Essential (primary) hypertension: Secondary | ICD-10-CM | POA: Diagnosis not present

## 2016-06-25 DIAGNOSIS — Z8673 Personal history of transient ischemic attack (TIA), and cerebral infarction without residual deficits: Secondary | ICD-10-CM | POA: Diagnosis not present

## 2016-06-25 DIAGNOSIS — C50412 Malignant neoplasm of upper-outer quadrant of left female breast: Secondary | ICD-10-CM | POA: Diagnosis not present

## 2016-06-25 DIAGNOSIS — E785 Hyperlipidemia, unspecified: Secondary | ICD-10-CM | POA: Diagnosis not present

## 2016-06-25 DIAGNOSIS — Z170 Estrogen receptor positive status [ER+]: Secondary | ICD-10-CM | POA: Diagnosis not present

## 2016-06-25 DIAGNOSIS — J449 Chronic obstructive pulmonary disease, unspecified: Secondary | ICD-10-CM | POA: Diagnosis not present

## 2016-06-25 DIAGNOSIS — Z79899 Other long term (current) drug therapy: Secondary | ICD-10-CM | POA: Diagnosis not present

## 2016-06-26 ENCOUNTER — Ambulatory Visit
Admission: RE | Admit: 2016-06-26 | Discharge: 2016-06-26 | Disposition: A | Discharge status: Home / Self Care | Payer: Commercial Managed Care - HMO | Source: Ambulatory Visit | Attending: Radiation Oncology | Admitting: Radiation Oncology

## 2016-06-26 ENCOUNTER — Encounter: Payer: Self-pay | Admitting: Radiation Oncology

## 2016-06-26 VITALS — BP 164/78 | HR 63 | Temp 97.6°F | Resp 12 | Wt 205.1 lb

## 2016-06-26 DIAGNOSIS — G35 Multiple sclerosis: Secondary | ICD-10-CM | POA: Diagnosis not present

## 2016-06-26 DIAGNOSIS — Z510 Encounter for antineoplastic radiation therapy: Secondary | ICD-10-CM | POA: Diagnosis not present

## 2016-06-26 DIAGNOSIS — E785 Hyperlipidemia, unspecified: Secondary | ICD-10-CM | POA: Diagnosis not present

## 2016-06-26 DIAGNOSIS — C50412 Malignant neoplasm of upper-outer quadrant of left female breast: Secondary | ICD-10-CM

## 2016-06-26 DIAGNOSIS — Z8673 Personal history of transient ischemic attack (TIA), and cerebral infarction without residual deficits: Secondary | ICD-10-CM | POA: Diagnosis not present

## 2016-06-26 DIAGNOSIS — Z79899 Other long term (current) drug therapy: Secondary | ICD-10-CM | POA: Diagnosis not present

## 2016-06-26 DIAGNOSIS — Z170 Estrogen receptor positive status [ER+]: Secondary | ICD-10-CM | POA: Diagnosis not present

## 2016-06-26 DIAGNOSIS — J449 Chronic obstructive pulmonary disease, unspecified: Secondary | ICD-10-CM | POA: Diagnosis not present

## 2016-06-26 DIAGNOSIS — I10 Essential (primary) hypertension: Secondary | ICD-10-CM | POA: Diagnosis not present

## 2016-06-26 NOTE — Progress Notes (Signed)
PAIN: She is currently in no pain. SKIN: Pt left breast- positive for Dryness, Hyperpigmentation, Pruritus, erythema, breast tenderness and moist desquamation-under breast fold.  Pt denies edema.  Pt continues to apply Radiaplex and Neosporin as directed. OTHER: Pt complains of fatigue, loss of sleep. BP (!) 164/78   Pulse 63   Temp 97.6 F (36.4 C) (Oral)   Resp 12   Wt 205 lb 1.6 oz (93 kg)   SpO2 99%   BMI 36.33 kg/m  Wt Readings from Last 3 Encounters:  06/26/16 205 lb 1.6 oz (93 kg)  06/23/16 203 lb 3.2 oz (92.2 kg)  06/19/16 204 lb 1.6 oz (92.6 kg)

## 2016-06-26 NOTE — Progress Notes (Signed)
   Department of Radiation Oncology  Phone:  [phone] Fax:        [phone]  Weekly Treatment Note    Name: Michele Page Date: 06/28/2016 MRN: [id number] DOB: 08-01-1956   Diagnosis:     ICD-9-CM ICD-10-CM   1. Breast cancer of upper-outer quadrant of left female breast ([place]) 174.4 C50.412      Current dose: 41.4 Gy  Current fraction: 23   MEDICATIONS: Current Outpatient Prescriptions  Medication Sig Dispense Refill  . amLODipine (NORVASC) 10 MG tablet Take 1 tablet (10 mg total) by mouth daily. 30 tablet 5  . aspirin EC 81 MG tablet Take 1 tablet (81 mg total) by mouth daily. 30 tablet 6  . ibuprofen (ADVIL,MOTRIN) 600 MG tablet Take 1 tablet (600 mg total) by mouth every 6 (six) hours as needed. 30 tablet 0  . labetalol (NORMODYNE) 300 MG tablet Take 2 tablets (600 mg total) by mouth 3 (three) times daily. 180 tablet 4  . losartan (COZAAR) 100 MG tablet TAKE ONE TABLET BY MOUTH ONCE DAILY FOR BLOOD PRESSURE 90 tablet 0  . pravastatin (PRAVACHOL) 10 MG tablet TAKE ONE TABLET BY MOUTH AT BEDTIME 30 tablet 0  . zolpidem (AMBIEN) 5 MG tablet Take 1 tablet (5 mg total) by mouth at bedtime as needed for sleep. 15 tablet 1   No current facility-administered medications for this encounter.      ALLERGIES: Review of patient's allergies indicates no known allergies.   LABORATORY DATA:  Lab Results  Component Value Date   WBC 4.9 04/08/2016   HGB 12.6 04/08/2016   HCT 38.0 04/08/2016   MCV 93.6 04/08/2016   PLT 271 04/08/2016   Lab Results  Component Value Date   NA 141 04/08/2016   K 4.7 04/08/2016   CL 104 12/18/2015   CO2 26 04/08/2016   Lab Results  Component Value Date   ALT 23 04/08/2016   AST 25 04/08/2016   ALKPHOS 90 04/08/2016   BILITOT 0.58 04/08/2016     NARRATIVE: Michele Page was seen today for weekly treatment management. The chart was checked and the patient's films were reviewed.  She is currently in no pain. Reports dryness,  hyperpigmentation, pruritis, erythema, breast tenderness, and moist desquamation under the breast fold. Denies edema. Using Radiaplex and Neosporin as directed. She began using Neosporin about 2.5 to 3 weeks ago. Reports fatigue and loss of sleep.   PHYSICAL EXAMINATION: weight is 205 lb 1.6 oz (93 kg). Her oral temperature is 97.6 F (36.4 C). Her blood pressure is 164/78 (abnormal) and her pulse is 63. Her respiration is 12 and oxygen saturation is 99%.      Hyperpigmentation with no significant desquamation.  ASSESSMENT: The patient is doing satisfactorily with treatment.  PLAN: We will continue with the patient's radiation treatment as planned.     This document serves as a record of services personally performed by Kyung Rudd, MD. It was created on his behalf by Arlyce Harman, a trained medical scribe. The creation of this record is based on the scribe's personal observations and the provider's statements to them. This document has been checked and approved by the attending provider.  ------------------------------------------------  Michele Gross, MD, PhD

## 2016-06-29 ENCOUNTER — Ambulatory Visit
Admission: RE | Admit: 2016-06-29 | Discharge: 2016-06-29 | Disposition: A | Discharge status: Home / Self Care | Payer: Commercial Managed Care - HMO | Source: Ambulatory Visit | Attending: Radiation Oncology | Admitting: Radiation Oncology

## 2016-06-29 DIAGNOSIS — G35 Multiple sclerosis: Secondary | ICD-10-CM | POA: Diagnosis not present

## 2016-06-29 DIAGNOSIS — Z79899 Other long term (current) drug therapy: Secondary | ICD-10-CM | POA: Diagnosis not present

## 2016-06-29 DIAGNOSIS — I10 Essential (primary) hypertension: Secondary | ICD-10-CM | POA: Diagnosis not present

## 2016-06-29 DIAGNOSIS — E785 Hyperlipidemia, unspecified: Secondary | ICD-10-CM | POA: Diagnosis not present

## 2016-06-29 DIAGNOSIS — J449 Chronic obstructive pulmonary disease, unspecified: Secondary | ICD-10-CM | POA: Diagnosis not present

## 2016-06-29 DIAGNOSIS — Z170 Estrogen receptor positive status [ER+]: Secondary | ICD-10-CM | POA: Diagnosis not present

## 2016-06-29 DIAGNOSIS — C50412 Malignant neoplasm of upper-outer quadrant of left female breast: Secondary | ICD-10-CM | POA: Diagnosis not present

## 2016-06-29 DIAGNOSIS — Z8673 Personal history of transient ischemic attack (TIA), and cerebral infarction without residual deficits: Secondary | ICD-10-CM | POA: Diagnosis not present

## 2016-06-29 DIAGNOSIS — Z510 Encounter for antineoplastic radiation therapy: Secondary | ICD-10-CM | POA: Diagnosis not present

## 2016-06-30 ENCOUNTER — Ambulatory Visit (INDEPENDENT_AMBULATORY_CARE_PROVIDER_SITE_OTHER): Payer: Commercial Managed Care - HMO | Admitting: Pharmacist

## 2016-06-30 ENCOUNTER — Ambulatory Visit
Admission: RE | Admit: 2016-06-30 | Discharge: 2016-06-30 | Disposition: A | Discharge status: Home / Self Care | Payer: Commercial Managed Care - HMO | Source: Ambulatory Visit | Attending: Radiation Oncology | Admitting: Radiation Oncology

## 2016-06-30 VITALS — BP 148/84 | HR 71 | Wt 206.0 lb

## 2016-06-30 DIAGNOSIS — I10 Essential (primary) hypertension: Secondary | ICD-10-CM

## 2016-06-30 DIAGNOSIS — Z170 Estrogen receptor positive status [ER+]: Secondary | ICD-10-CM | POA: Diagnosis not present

## 2016-06-30 DIAGNOSIS — E785 Hyperlipidemia, unspecified: Secondary | ICD-10-CM | POA: Diagnosis not present

## 2016-06-30 DIAGNOSIS — Z8673 Personal history of transient ischemic attack (TIA), and cerebral infarction without residual deficits: Secondary | ICD-10-CM | POA: Diagnosis not present

## 2016-06-30 DIAGNOSIS — G35 Multiple sclerosis: Secondary | ICD-10-CM | POA: Diagnosis not present

## 2016-06-30 DIAGNOSIS — Z79899 Other long term (current) drug therapy: Secondary | ICD-10-CM | POA: Diagnosis not present

## 2016-06-30 DIAGNOSIS — Z510 Encounter for antineoplastic radiation therapy: Secondary | ICD-10-CM | POA: Diagnosis not present

## 2016-06-30 DIAGNOSIS — J449 Chronic obstructive pulmonary disease, unspecified: Secondary | ICD-10-CM | POA: Diagnosis not present

## 2016-06-30 DIAGNOSIS — C50412 Malignant neoplasm of upper-outer quadrant of left female breast: Secondary | ICD-10-CM | POA: Diagnosis not present

## 2016-06-30 NOTE — Patient Instructions (Signed)
Call insurance company about chlorthalidone - if too expensive call [phone] and we will discuss other options.

## 2016-06-30 NOTE — Progress Notes (Signed)
Patient ID: Michele Page                 DOB: December 02, 1955                      MRN: [id number]     HPI: Michele Page is a 60 y.o. female patient of Dr. Gwenlyn Found with [facility] who presents today for hypertension follow-up.  She reports having high blood pressure since her early 38s. She was recently diagnosed with breast cancer and is currently undergoing radiation treatment.  At her last visit we with me we revisited the idea of amlodipine and had a long discussion about giving the medication more than a couple of days to work in her system. Despite this conversation she took the amlodipine for 2 days then discontinued because "it did not work." When asked what did not work about the medication she just continues to repeat that it didn't work.   She states she restarted nifedipine about a day after stopping amlodipine and states that she stopped it about 3 weeks prior to our visit today (which would actually be prior to our last visit).   When asked for specific blood pressure readings - she did not bring a log but states:  On amlodipine + labetalol + losartan - her pressures were 170-180s  On  nifedipine + labetalol +losartan - her pressures were 160-170s  On labetalol + losartan (without 3rd agent) - her pressures are 140-150s  She states that her pressures are 120s/80s until 4:30 - 6:30 pm - this is the time her blood pressure runs high she states because there is "no medication in my system."   We discussed moving her 3pm dose of labetalol a little bit earlier to allow this to "be in her system" at that time and she stated that this was not the problem, but rather that she needs something that comes as 30mg .   She states she is willing to try amlodipine again if we prescribe 30mg  - We again had the conversation that the mg of each drug do not equate to each other and that it would be potentially dangerous for her to take 30mg  of amlodipine.   We discussed other options such as other calcium  channel blockers or diuretics or even potentially a more potent ARB like valsartan or irbesartan. She declined drug after drug, telling me that losartan and labetalol worked for her but she would need 30mg  of something else to keep her pressures down between the hours of 4-6pm.    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, HTN  Current HTN meds:  Labetalol 600mg TID (8am, 3pm, 9pm) Losartan 100mg  daily (8am)  Previously tried: nifedipine - causes her heart to race Nicardipine - too expensive  Cardiac Hx: stroke 4 years ago (age 4) with some residual expressive aphasia, HTN  Family Hx: mother died from stroke at 89, sister from stroke in her 11's; both believed to be related to ongoing hypertension; father died from MI at 39; She has 5 brothers (2 still living), but none had/have cardiovascular issues  Social Hx: quit tobacco 15+ years ago; only rare alcohol, drinks 1 cup coffee per day at most  Diet: eats almost all meals at home, only small amounts of salt used. Currently eating larger portions of vegetables, cut meats out and eats only chicken  Exercise: gym 5 days per week; stays about 2 hours doing both cardio and strength training  Home BP readings: see above  Wt Readings from Last 3 Encounters:  06/30/16 206 lb (93.4 kg)  06/26/16 205 lb 1.6 oz (93 kg)  06/23/16 203 lb 3.2 oz (92.2 kg)   BP Readings from Last 3 Encounters:  06/30/16 (!) 148/84  06/26/16 (!) 164/78  06/23/16 128/77   Pulse Readings from Last 3 Encounters:  06/30/16 71  06/26/16 63  06/23/16 70    Renal function: CrCl cannot be calculated (Patient's most recent lab result is older than the maximum 21 days allowed.).  Past Medical History:  Diagnosis Date  . Arthritis    lt knee  . Breast cancer ([place])   . Breast cancer of upper-outer quadrant of left female breast ([place]) 04/02/2016  . Colon polyps   . COPD (chronic obstructive pulmonary disease) ([place])   .  Hyperlipidemia   . Hypertension   . Multiple sclerosis ([place])   . Stroke ([place])    2013, mild cognitive deficits  . Vision abnormalities     Current Outpatient Prescriptions on File Prior to Visit  Medication Sig Dispense Refill  . aspirin EC 81 MG tablet Take 1 tablet (81 mg total) by mouth daily. 30 tablet 6  . ibuprofen (ADVIL,MOTRIN) 600 MG tablet Take 1 tablet (600 mg total) by mouth every 6 (six) hours as needed. 30 tablet 0  . labetalol (NORMODYNE) 300 MG tablet Take 2 tablets (600 mg total) by mouth 3 (three) times daily. 180 tablet 4  . losartan (COZAAR) 100 MG tablet TAKE ONE TABLET BY MOUTH ONCE DAILY FOR BLOOD PRESSURE 90 tablet 0  . pravastatin (PRAVACHOL) 10 MG tablet TAKE ONE TABLET BY MOUTH AT BEDTIME 30 tablet 0  . zolpidem (AMBIEN) 5 MG tablet Take 1 tablet (5 mg total) by mouth at bedtime as needed for sleep. 15 tablet 1   No current facility-administered medications on file prior to visit.     No Known Allergies  Blood pressure (!) 148/84, pulse 71, weight 206 lb (93.4 kg).   Assessment/Plan: Hypertension: BP is not at goal today. After lengthy discussion she agreed to check price on chlorthalidone and look into applying for LIS. If she qualifies for LIS she will go back on nicardipine since she states this has worked for her in past if not we will see if she will try chlorthalidone. She will call later this week with the results of her research on LIS.  Thank you, Michele Page. Patterson Hammersmith, [facility]  07/01/2016 12:35 PM

## 2016-07-01 ENCOUNTER — Ambulatory Visit
Admission: RE | Admit: 2016-07-01 | Discharge: 2016-07-01 | Disposition: A | Discharge status: Home / Self Care | Payer: Commercial Managed Care - HMO | Source: Ambulatory Visit | Attending: Radiation Oncology | Admitting: Radiation Oncology

## 2016-07-01 ENCOUNTER — Ambulatory Visit: Payer: Commercial Managed Care - HMO | Admitting: Radiation Oncology

## 2016-07-01 ENCOUNTER — Encounter: Payer: Self-pay | Admitting: Pharmacist

## 2016-07-01 DIAGNOSIS — E785 Hyperlipidemia, unspecified: Secondary | ICD-10-CM | POA: Diagnosis not present

## 2016-07-01 DIAGNOSIS — Z510 Encounter for antineoplastic radiation therapy: Secondary | ICD-10-CM | POA: Diagnosis not present

## 2016-07-01 DIAGNOSIS — I10 Essential (primary) hypertension: Secondary | ICD-10-CM | POA: Diagnosis not present

## 2016-07-01 DIAGNOSIS — J449 Chronic obstructive pulmonary disease, unspecified: Secondary | ICD-10-CM | POA: Diagnosis not present

## 2016-07-01 DIAGNOSIS — Z170 Estrogen receptor positive status [ER+]: Secondary | ICD-10-CM | POA: Diagnosis not present

## 2016-07-01 DIAGNOSIS — G35 Multiple sclerosis: Secondary | ICD-10-CM | POA: Diagnosis not present

## 2016-07-01 DIAGNOSIS — Z8673 Personal history of transient ischemic attack (TIA), and cerebral infarction without residual deficits: Secondary | ICD-10-CM | POA: Diagnosis not present

## 2016-07-01 DIAGNOSIS — Z79899 Other long term (current) drug therapy: Secondary | ICD-10-CM | POA: Diagnosis not present

## 2016-07-01 DIAGNOSIS — C50412 Malignant neoplasm of upper-outer quadrant of left female breast: Secondary | ICD-10-CM | POA: Diagnosis not present

## 2016-07-02 ENCOUNTER — Ambulatory Visit
Admission: RE | Admit: 2016-07-02 | Discharge: 2016-07-02 | Disposition: A | Discharge status: Home / Self Care | Payer: Commercial Managed Care - HMO | Source: Ambulatory Visit | Attending: Radiation Oncology | Admitting: Radiation Oncology

## 2016-07-02 VITALS — BP 116/68 | HR 72 | Resp 16 | Wt 202.0 lb

## 2016-07-02 DIAGNOSIS — G35 Multiple sclerosis: Secondary | ICD-10-CM | POA: Diagnosis not present

## 2016-07-02 DIAGNOSIS — C50412 Malignant neoplasm of upper-outer quadrant of left female breast: Secondary | ICD-10-CM | POA: Diagnosis not present

## 2016-07-02 DIAGNOSIS — J449 Chronic obstructive pulmonary disease, unspecified: Secondary | ICD-10-CM | POA: Diagnosis not present

## 2016-07-02 DIAGNOSIS — E785 Hyperlipidemia, unspecified: Secondary | ICD-10-CM | POA: Diagnosis not present

## 2016-07-02 DIAGNOSIS — I10 Essential (primary) hypertension: Secondary | ICD-10-CM | POA: Diagnosis not present

## 2016-07-02 DIAGNOSIS — Z8673 Personal history of transient ischemic attack (TIA), and cerebral infarction without residual deficits: Secondary | ICD-10-CM | POA: Diagnosis not present

## 2016-07-02 DIAGNOSIS — Z510 Encounter for antineoplastic radiation therapy: Secondary | ICD-10-CM | POA: Diagnosis not present

## 2016-07-02 DIAGNOSIS — Z79899 Other long term (current) drug therapy: Secondary | ICD-10-CM | POA: Diagnosis not present

## 2016-07-02 DIAGNOSIS — Z170 Estrogen receptor positive status [ER+]: Secondary | ICD-10-CM | POA: Diagnosis not present

## 2016-07-02 NOTE — Progress Notes (Signed)
Weight and vitals stable. Denies pain. No evidence of lymphedema noted. Hyperpigmentation of left breast noted. Reports using radiaplex on breast as directed. Area of desquamation at mammary fold is now closed with the aid of neosporin. Denies fatigue.   BP 116/68   Pulse 72   Resp 16   Wt 202 lb (91.6 kg)   SpO2 100%   BMI 35.78 kg/m  Wt Readings from Last 3 Encounters:  07/02/16 202 lb (91.6 kg)  06/30/16 206 lb (93.4 kg)  06/26/16 205 lb 1.6 oz (93 kg)

## 2016-07-02 NOTE — Progress Notes (Signed)
  Radiation Oncology         [phone]   Name: Michele Page MRN: [id number]   Date: 07/02/2016  DOB: 02/15/1956   Weekly Radiation Therapy Management    Current Dose: 48.6 Gy  Planned Dose:  60.4 Gy  Narrative The patient presents for routine under treatment assessment.  Weight and vitals stable. Denies pain. No evidence of lymphedema noted. Hyperpigmentation of left breast noted. Reports using radiaplex on breast as directed. Area of desquamation at mammary fold is now closed with the aid of neosporin. Denies fatigue.  The patient is without complaint. Set-up films were reviewed. The chart was checked.  Physical Findings  weight is 202 lb (91.6 kg). Her blood pressure is 116/68 and her pulse is 72. Her respiration is 16 and oxygen saturation is 100%. . Weight essentially stable.  Patient has erythema and hyperpigmentation within the treatment fields. She has minimal focal dry desquamation in the central IM fold.   Impression The patient is tolerating radiation.  Plan Continue treatment as planned.         Sheral Apley Tammi Klippel, M.D.    This document serves as a record of services personally performed by Tyler Pita, MD. It was created on his behalf by Arlyce Harman, a trained medical scribe. The creation of this record is based on the scribe's personal observations and the provider's statements to them. This document has been checked and approved by the attending provider.

## 2016-07-03 ENCOUNTER — Ambulatory Visit: Payer: Commercial Managed Care - HMO

## 2016-07-06 ENCOUNTER — Ambulatory Visit: Payer: Commercial Managed Care - HMO

## 2016-07-06 DIAGNOSIS — Z8673 Personal history of transient ischemic attack (TIA), and cerebral infarction without residual deficits: Secondary | ICD-10-CM | POA: Diagnosis not present

## 2016-07-06 DIAGNOSIS — C50412 Malignant neoplasm of upper-outer quadrant of left female breast: Secondary | ICD-10-CM | POA: Diagnosis not present

## 2016-07-06 DIAGNOSIS — J449 Chronic obstructive pulmonary disease, unspecified: Secondary | ICD-10-CM | POA: Diagnosis not present

## 2016-07-06 DIAGNOSIS — E785 Hyperlipidemia, unspecified: Secondary | ICD-10-CM | POA: Diagnosis not present

## 2016-07-06 DIAGNOSIS — Z79899 Other long term (current) drug therapy: Secondary | ICD-10-CM | POA: Diagnosis not present

## 2016-07-06 DIAGNOSIS — G35 Multiple sclerosis: Secondary | ICD-10-CM | POA: Diagnosis not present

## 2016-07-06 DIAGNOSIS — I10 Essential (primary) hypertension: Secondary | ICD-10-CM | POA: Diagnosis not present

## 2016-07-06 DIAGNOSIS — Z170 Estrogen receptor positive status [ER+]: Secondary | ICD-10-CM | POA: Diagnosis not present

## 2016-07-06 DIAGNOSIS — Z510 Encounter for antineoplastic radiation therapy: Secondary | ICD-10-CM | POA: Diagnosis not present

## 2016-07-07 ENCOUNTER — Ambulatory Visit
Admission: RE | Admit: 2016-07-07 | Discharge: 2016-07-07 | Disposition: A | Discharge status: Home / Self Care | Payer: Commercial Managed Care - HMO | Source: Ambulatory Visit | Attending: Radiation Oncology | Admitting: Radiation Oncology

## 2016-07-07 ENCOUNTER — Telehealth: Payer: Self-pay | Admitting: Pharmacist Clinician (PhC)/ Clinical Pharmacy Specialist

## 2016-07-07 ENCOUNTER — Ambulatory Visit: Payer: Commercial Managed Care - HMO

## 2016-07-07 DIAGNOSIS — C50412 Malignant neoplasm of upper-outer quadrant of left female breast: Secondary | ICD-10-CM | POA: Diagnosis not present

## 2016-07-07 DIAGNOSIS — J449 Chronic obstructive pulmonary disease, unspecified: Secondary | ICD-10-CM | POA: Diagnosis not present

## 2016-07-07 DIAGNOSIS — Z79899 Other long term (current) drug therapy: Secondary | ICD-10-CM | POA: Diagnosis not present

## 2016-07-07 DIAGNOSIS — Z8673 Personal history of transient ischemic attack (TIA), and cerebral infarction without residual deficits: Secondary | ICD-10-CM | POA: Diagnosis not present

## 2016-07-07 DIAGNOSIS — Z170 Estrogen receptor positive status [ER+]: Secondary | ICD-10-CM | POA: Diagnosis not present

## 2016-07-07 DIAGNOSIS — E785 Hyperlipidemia, unspecified: Secondary | ICD-10-CM | POA: Diagnosis not present

## 2016-07-07 DIAGNOSIS — G35 Multiple sclerosis: Secondary | ICD-10-CM | POA: Diagnosis not present

## 2016-07-07 DIAGNOSIS — Z510 Encounter for antineoplastic radiation therapy: Secondary | ICD-10-CM | POA: Diagnosis not present

## 2016-07-07 DIAGNOSIS — I10 Essential (primary) hypertension: Secondary | ICD-10-CM | POA: Diagnosis not present

## 2016-07-07 MED ORDER — CHLORTHALIDONE 25 MG PO TABS: 25.0000 mg | ORAL_TABLET | Freq: Every day | ORAL | 1 refills | Status: DC

## 2016-07-07 NOTE — Telephone Encounter (Signed)
Patient called, states chlorthalidone price is $17/month.  States this is not ideal, but she can give it a try for a month.  Encouraged her to call for LIS, information was given at her last appointment.    She wanted to start at 50 mg dose, as the price is the same as 25 mg tablets.  Explained that this is not safe, as we don't know the full effect and don't want to throw her electrolytes off balance.    Sent rx for 25 mg chlorthalidone to [facility], will contact her again in another week to see how she is tolerating and encourage her to get a BMET.

## 2016-07-08 ENCOUNTER — Ambulatory Visit
Admission: RE | Admit: 2016-07-08 | Discharge: 2016-07-08 | Disposition: A | Discharge status: Home / Self Care | Payer: Commercial Managed Care - HMO | Source: Ambulatory Visit | Attending: Radiation Oncology | Admitting: Radiation Oncology

## 2016-07-08 DIAGNOSIS — C50412 Malignant neoplasm of upper-outer quadrant of left female breast: Secondary | ICD-10-CM | POA: Diagnosis not present

## 2016-07-08 DIAGNOSIS — I10 Essential (primary) hypertension: Secondary | ICD-10-CM | POA: Diagnosis not present

## 2016-07-08 DIAGNOSIS — E785 Hyperlipidemia, unspecified: Secondary | ICD-10-CM | POA: Diagnosis not present

## 2016-07-08 DIAGNOSIS — J449 Chronic obstructive pulmonary disease, unspecified: Secondary | ICD-10-CM | POA: Diagnosis not present

## 2016-07-08 DIAGNOSIS — G35 Multiple sclerosis: Secondary | ICD-10-CM | POA: Diagnosis not present

## 2016-07-08 DIAGNOSIS — Z79899 Other long term (current) drug therapy: Secondary | ICD-10-CM | POA: Diagnosis not present

## 2016-07-08 DIAGNOSIS — Z8673 Personal history of transient ischemic attack (TIA), and cerebral infarction without residual deficits: Secondary | ICD-10-CM | POA: Diagnosis not present

## 2016-07-08 DIAGNOSIS — Z170 Estrogen receptor positive status [ER+]: Secondary | ICD-10-CM | POA: Diagnosis not present

## 2016-07-08 DIAGNOSIS — Z510 Encounter for antineoplastic radiation therapy: Secondary | ICD-10-CM | POA: Diagnosis not present

## 2016-07-09 ENCOUNTER — Ambulatory Visit
Admission: RE | Admit: 2016-07-09 | Discharge: 2016-07-09 | Disposition: A | Discharge status: Home / Self Care | Payer: Commercial Managed Care - HMO | Source: Ambulatory Visit | Attending: Radiation Oncology | Admitting: Radiation Oncology

## 2016-07-09 DIAGNOSIS — I10 Essential (primary) hypertension: Secondary | ICD-10-CM | POA: Diagnosis not present

## 2016-07-09 DIAGNOSIS — J449 Chronic obstructive pulmonary disease, unspecified: Secondary | ICD-10-CM | POA: Diagnosis not present

## 2016-07-09 DIAGNOSIS — Z8673 Personal history of transient ischemic attack (TIA), and cerebral infarction without residual deficits: Secondary | ICD-10-CM | POA: Diagnosis not present

## 2016-07-09 DIAGNOSIS — Z79899 Other long term (current) drug therapy: Secondary | ICD-10-CM | POA: Diagnosis not present

## 2016-07-09 DIAGNOSIS — C50412 Malignant neoplasm of upper-outer quadrant of left female breast: Secondary | ICD-10-CM | POA: Diagnosis not present

## 2016-07-09 DIAGNOSIS — G35 Multiple sclerosis: Secondary | ICD-10-CM | POA: Diagnosis not present

## 2016-07-09 DIAGNOSIS — Z170 Estrogen receptor positive status [ER+]: Secondary | ICD-10-CM | POA: Diagnosis not present

## 2016-07-09 DIAGNOSIS — Z510 Encounter for antineoplastic radiation therapy: Secondary | ICD-10-CM | POA: Diagnosis not present

## 2016-07-09 DIAGNOSIS — E785 Hyperlipidemia, unspecified: Secondary | ICD-10-CM | POA: Diagnosis not present

## 2016-07-10 ENCOUNTER — Ambulatory Visit
Admission: RE | Admit: 2016-07-10 | Discharge: 2016-07-10 | Disposition: A | Discharge status: Home / Self Care | Payer: Commercial Managed Care - HMO | Source: Ambulatory Visit | Attending: Radiation Oncology | Admitting: Radiation Oncology

## 2016-07-10 ENCOUNTER — Ambulatory Visit: Payer: Commercial Managed Care - HMO

## 2016-07-10 ENCOUNTER — Encounter: Payer: Self-pay | Admitting: Radiation Oncology

## 2016-07-10 VITALS — BP 135/81 | HR 63 | Temp 98.2°F | Ht 63.0 in | Wt 203.2 lb

## 2016-07-10 DIAGNOSIS — J449 Chronic obstructive pulmonary disease, unspecified: Secondary | ICD-10-CM | POA: Diagnosis not present

## 2016-07-10 DIAGNOSIS — G35 Multiple sclerosis: Secondary | ICD-10-CM | POA: Diagnosis not present

## 2016-07-10 DIAGNOSIS — Z170 Estrogen receptor positive status [ER+]: Secondary | ICD-10-CM | POA: Diagnosis not present

## 2016-07-10 DIAGNOSIS — I10 Essential (primary) hypertension: Secondary | ICD-10-CM | POA: Diagnosis not present

## 2016-07-10 DIAGNOSIS — C50412 Malignant neoplasm of upper-outer quadrant of left female breast: Secondary | ICD-10-CM | POA: Diagnosis not present

## 2016-07-10 DIAGNOSIS — Z79899 Other long term (current) drug therapy: Secondary | ICD-10-CM | POA: Diagnosis not present

## 2016-07-10 DIAGNOSIS — Z8673 Personal history of transient ischemic attack (TIA), and cerebral infarction without residual deficits: Secondary | ICD-10-CM | POA: Diagnosis not present

## 2016-07-10 DIAGNOSIS — Z510 Encounter for antineoplastic radiation therapy: Secondary | ICD-10-CM | POA: Diagnosis not present

## 2016-07-10 DIAGNOSIS — E785 Hyperlipidemia, unspecified: Secondary | ICD-10-CM | POA: Diagnosis not present

## 2016-07-10 NOTE — Progress Notes (Signed)
Michele Page has completed 32 fractions to her left breast.  She denies having pain and fatigue.  She is using radiaplex. The skin on her left breast is red with hyperpigmentation.  She has a dry peeling area under her left breast.  She has been given a one month follow up appointment.  BP 135/81 (BP Location: Right Arm, Patient Position: Sitting)   Pulse 63   Temp 98.2 F (36.8 C) (Oral)   Ht 5\' 3"  (1.6 m)   Wt 203 lb 3.2 oz (92.2 kg)   SpO2 100%   BMI 36.00 kg/m    Wt Readings from Last 3 Encounters:  07/10/16 203 lb 3.2 oz (92.2 kg)  07/02/16 202 lb (91.6 kg)  06/30/16 206 lb (93.4 kg)

## 2016-07-10 NOTE — Progress Notes (Signed)
   Department of Radiation Oncology  Phone:  [phone] Fax:        [phone]  Weekly Treatment Note    Name: Michele Page Date: 07/12/2016 MRN: [id number] DOB: 1956/08/25   Diagnosis:     ICD-9-CM ICD-10-CM   1. Breast cancer of upper-outer quadrant of left female breast ([place]) 174.4 C50.412      Current dose: 58.4 Gy  Current fraction: 32   MEDICATIONS: Current Outpatient Prescriptions  Medication Sig Dispense Refill  . aspirin EC 81 MG tablet Take 1 tablet (81 mg total) by mouth daily. 30 tablet 6  . chlorthalidone (HYGROTON) 25 MG tablet Take 1 tablet (25 mg total) by mouth daily. 30 tablet 1  . labetalol (NORMODYNE) 300 MG tablet Take 2 tablets (600 mg total) by mouth 3 (three) times daily. 180 tablet 4  . losartan (COZAAR) 100 MG tablet TAKE ONE TABLET BY MOUTH ONCE DAILY FOR BLOOD PRESSURE 90 tablet 0  . pravastatin (PRAVACHOL) 10 MG tablet TAKE ONE TABLET BY MOUTH AT BEDTIME 30 tablet 0  . zolpidem (AMBIEN) 5 MG tablet Take 1 tablet (5 mg total) by mouth at bedtime as needed for sleep. 15 tablet 1  . ibuprofen (ADVIL,MOTRIN) 600 MG tablet Take 1 tablet (600 mg total) by mouth every 6 (six) hours as needed. (Patient not taking: Reported on 07/10/2016) 30 tablet 0   No current facility-administered medications for this encounter.      ALLERGIES: Review of patient's allergies indicates no known allergies.   LABORATORY DATA:  Lab Results  Component Value Date   WBC 4.9 04/08/2016   HGB 12.6 04/08/2016   HCT 38.0 04/08/2016   MCV 93.6 04/08/2016   PLT 271 04/08/2016   Lab Results  Component Value Date   NA 141 04/08/2016   K 4.7 04/08/2016   CL 104 12/18/2015   CO2 26 04/08/2016   Lab Results  Component Value Date   ALT 23 04/08/2016   AST 25 04/08/2016   ALKPHOS 90 04/08/2016   BILITOT 0.58 04/08/2016     NARRATIVE: Michele Page was seen today for weekly treatment management. The chart was checked and the patient's films were reviewed.  Michele Page has completed 32 fractions to her left breast.  She denies having pain and fatigue.  She is using radiaplex. She has been given a one month follow up appointment.  PHYSICAL EXAMINATION: height is 5\' 3"  (1.6 m) and weight is 203 lb 3.2 oz (92.2 kg). Her oral temperature is 98.2 F (36.8 C). Her blood pressure is 135/81 and her pulse is 63. Her oxygen saturation is 100%.       Diffuse hyperpigmentation over the left breast.  ASSESSMENT: The patient is doing satisfactorily with treatment.  PLAN: We will continue with the patient's radiation treatment as planned.  ------------------------------------------------  Jodelle Gross, MD, PhD  This document serves as a record of services personally performed by Kyung Rudd, MD. It was created on his behalf by Darcus [name], a trained medical scribe. The creation of this record is based on the scribe's personal observations and the provider's statements to them. This document has been checked and approved by the attending provider.

## 2016-07-13 ENCOUNTER — Encounter: Payer: Self-pay | Admitting: Radiation Oncology

## 2016-07-13 ENCOUNTER — Ambulatory Visit
Admission: RE | Admit: 2016-07-13 | Discharge: 2016-07-13 | Disposition: A | Discharge status: Home / Self Care | Payer: Commercial Managed Care - HMO | Source: Ambulatory Visit | Attending: Radiation Oncology | Admitting: Radiation Oncology

## 2016-07-13 DIAGNOSIS — Z170 Estrogen receptor positive status [ER+]: Secondary | ICD-10-CM | POA: Diagnosis not present

## 2016-07-13 DIAGNOSIS — E785 Hyperlipidemia, unspecified: Secondary | ICD-10-CM | POA: Diagnosis not present

## 2016-07-13 DIAGNOSIS — I10 Essential (primary) hypertension: Secondary | ICD-10-CM | POA: Diagnosis not present

## 2016-07-13 DIAGNOSIS — Z510 Encounter for antineoplastic radiation therapy: Secondary | ICD-10-CM | POA: Diagnosis not present

## 2016-07-13 DIAGNOSIS — Z8673 Personal history of transient ischemic attack (TIA), and cerebral infarction without residual deficits: Secondary | ICD-10-CM | POA: Diagnosis not present

## 2016-07-13 DIAGNOSIS — C50412 Malignant neoplasm of upper-outer quadrant of left female breast: Secondary | ICD-10-CM | POA: Diagnosis not present

## 2016-07-13 DIAGNOSIS — J449 Chronic obstructive pulmonary disease, unspecified: Secondary | ICD-10-CM | POA: Diagnosis not present

## 2016-07-13 DIAGNOSIS — G35 Multiple sclerosis: Secondary | ICD-10-CM | POA: Diagnosis not present

## 2016-07-13 DIAGNOSIS — Z79899 Other long term (current) drug therapy: Secondary | ICD-10-CM | POA: Diagnosis not present

## 2016-07-13 NOTE — Progress Notes (Signed)
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    Indication for treatment:  Curative       Radiation treatment dates:  05/27/16- 07/13/16  Site/dose:   1. Left breast treated with breath hold to 50.4 Gy in 28 fractions at 1.8 Gy/fraction 2. Left breast boosted to 10 Gy in 5 fractions at 2 Gy/fraction  Beams/energy:   1. 3D // 10x, 6x 2. En face // 15 MeV  Narrative: The patient tolerated radiation treatment relatively well. Patient denies having pain or fatigue. Patient developed diffuse hyperpigmentation over the left breast.  Plan: The patient has completed radiation treatment. The patient will return to radiation oncology clinic for routine followup in one month. I advised her to call or return sooner if she has any questions or concerns related to her recovery or treatment. ________________________________  Michele Page. Tammi Klippel, M.D.   This document serves as a record of services personally performed by Michele Pita, MD. It was created on his behalf by Michele Page, a trained medical scribe. The creation of this record is based on the scribe's personal observations and the provider's statements to them. This document has been checked and approved by the attending provider.

## 2016-07-14 ENCOUNTER — Telehealth: Payer: Self-pay | Admitting: *Deleted

## 2016-07-14 DIAGNOSIS — C50412 Malignant neoplasm of upper-outer quadrant of left female breast: Secondary | ICD-10-CM

## 2016-07-14 NOTE — Telephone Encounter (Signed)
  Oncology Nurse Navigator Documentation    Navigator Encounter Type: Telephone (07/14/16 1000) Telephone: Outgoing Call (07/14/16 1000)         Patient Visit Type: [id number] (07/14/16 1000) Treatment Phase: Final Radiation Tx (07/14/16 1000) Barriers/Navigation Needs: No barriers at this time;No Questions;No Needs (07/14/16 1000)   Interventions: Referrals (07/14/16 1000) Referrals: Survivorship (07/14/16 1000)          Acuity: Level 1 (07/14/16 1000)         Time Spent with Patient: 15 (07/14/16 1000)

## 2016-07-15 ENCOUNTER — Telehealth: Payer: Self-pay | Admitting: Hematology and Oncology

## 2016-07-15 NOTE — Telephone Encounter (Signed)
appt made per LOS and letter sent by mail 8/30

## 2016-07-16 ENCOUNTER — Telehealth: Payer: Self-pay | Admitting: Pharmacist

## 2016-07-16 DIAGNOSIS — I10 Essential (primary) hypertension: Secondary | ICD-10-CM

## 2016-07-16 NOTE — Telephone Encounter (Signed)
Patient reports that new BP medication is not working.

## 2016-07-16 NOTE — Telephone Encounter (Signed)
Pt that she takes medication at 1 pm then checks her blood pressure at 115pm and that it remains elevated so she knows the medication is not working. She would like to increase dose.   Will have her get BMET next week to evaluate effect on kidneys and electrolytes. We again had conversation about the dangers of prescribing doses that are not studied (or her self titrating for that matter). She states she understands and will get BMET and come for BP check to determine if dose titration appropriate or addition of another agent is appropriate.

## 2016-07-18 ENCOUNTER — Other Ambulatory Visit: Payer: Self-pay | Admitting: Internal Medicine

## 2016-07-21 DIAGNOSIS — I10 Essential (primary) hypertension: Secondary | ICD-10-CM | POA: Diagnosis not present

## 2016-07-21 NOTE — Telephone Encounter (Signed)
rx sent to pharmacy by e-script  

## 2016-07-22 LAB — BASIC METABOLIC PANEL
BUN: 19 mg/dL (ref 7–25)
CHLORIDE: 101 mmol/L (ref 98–110)
CO2: 26 mmol/L (ref 20–31)
CREATININE: 1.5 mg/dL — AB (ref 0.50–0.99)
Calcium: 9.8 mg/dL (ref 8.6–10.4)
GLUCOSE: 93 mg/dL (ref 65–99)
POTASSIUM: 3.5 mmol/L (ref 3.5–5.3)
Sodium: 138 mmol/L (ref 135–146)

## 2016-07-23 ENCOUNTER — Encounter: Payer: Self-pay | Admitting: Hematology and Oncology

## 2016-07-23 ENCOUNTER — Ambulatory Visit (HOSPITAL_BASED_OUTPATIENT_CLINIC_OR_DEPARTMENT_OTHER): Payer: Commercial Managed Care - HMO | Admitting: Hematology and Oncology

## 2016-07-23 DIAGNOSIS — C50412 Malignant neoplasm of upper-outer quadrant of left female breast: Secondary | ICD-10-CM | POA: Diagnosis not present

## 2016-07-23 MED ORDER — ANASTROZOLE 1 MG PO TABS: 1.0000 mg | ORAL_TABLET | Freq: Every day | ORAL | 3 refills | Status: DC

## 2016-07-23 NOTE — Assessment & Plan Note (Addendum)
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, 1.3 cm, with DCIS, margins negative, 0/4 lymph nodes negative, T1 cN0 stage IA pathologic stage, ER 100%, PR 100%, HER-2 negative ratio 1.39, Ki-67 20% (Originally 2 nodules were detected on screening mammogram 1.9 cm= fat necrosis and 8 mm IDC) Oncotype DX score 20, 13% risk of recurrence, intermediate risk Adjuvant radiation therapy started 07/20/2017completed 07/13/2016  Recommendation: Adjuvant antiestrogen therapy with anastrozole 1 mg daily 5 years Anastrozole counseling:We discussed the risks and benefits of anti-estrogen therapy with aromatase inhibitors. These include but not limited to insomnia, hot flashes, mood changes, vaginal dryness, bone density loss, and weight gain. We strongly believe that the benefits far outweigh the risks. Patient understands these risks and consented to starting treatment. Planned treatment duration is 5 years.  Return to clinic in 3 months for toxicity check and follow-up

## 2016-07-23 NOTE — Progress Notes (Signed)
 Patient Care Team: Monica Carter, DO as PCP - General (Internal Medicine)  , MD as Consulting Physician (Oncology) Thomas [name], MD as Consulting Physician (General Surgery) Stacy Wentworth, MD as Consulting Physician (Radiation Oncology)  DIAGNOSIS: Breast cancer of 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)   Staging form: Breast, AJCC 7th Edition   - Clinical stage from 04/08/2016: Stage IA (T1b, N0, M0) - Unsigned         Staging comments: Staged at breast conference on 5.24.17  SUMMARY OF ONCOLOGIC HISTORY:   Breast cancer of 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)   03/31/2016 Initial Diagnosis    Screening detected left breast mass, 2 nodules, 1.9 x 1.6 x 0.8 cm= fat necrosis; 8 x 7 x 7 mm= grade 2 IDC ER 100%, PR 100%, HER-2 negative ratio 1.39, Ki-67 20%      04/17/2016 Surgery    Left lumpectomy: IDC grade 2, 1.3 cm, with DCIS, margins negative, 0/4 lymph nodes negative, T1 cN0 stage IA pathologic stage, ER 100%, PR 100%, HER-2 negative ratio 1.39, Ki-67 20% Oncotype DX score 20, 13% ROR, intermediate risk      05/27/2016 - 07/13/2016 Radiation Therapy    Adjuvant radiation therapy      07/23/2016 -  Anti-estrogen oral therapy    Anastrozole 1 mg by mouth daily 5 years       CHIEF COMPLIANT: follow-up after radiation therapy  INTERVAL HISTORY: Michele Page is a 60-year-old with above-mentioned history left breast cancer treated with lumpectomy adjuvant radiation and is here today to discuss the role of adjuvant antiestrogen therapy. She had recovered fairly well from radiation. She does have mild to moderate breast discomfort and discoloration. Occasional intermittent pains in the breasts.  REVIEW OF SYSTEMS:   Constitutional: Denies fevers, chills or abnormal weight loss Eyes: Denies blurriness of vision Ears, nose, mouth, throat, and face: Denies mucositis or sore throat Respiratory: Denies cough, dyspnea or wheezes Cardiovascular: Denies  palpitation, chest discomfort Gastrointestinal:  Denies nausea, heartburn or change in bowel habits Skin: Denies abnormal skin rashes Lymphatics: Denies new lymphadenopathy or easy bruising Neurological:Denies numbness, tingling or new weaknesses Behavioral/Psych: Mood is stable, no new changes  Extremities: No lower extremity edema Breast: radiation dermatitis All other systems were reviewed with the patient and are negative.  I have reviewed the past medical history, past surgical history, social history and family history with the patient and they are unchanged from previous note.  ALLERGIES:  has No Known Allergies.  MEDICATIONS:  Current Outpatient Prescriptions  Medication Sig Dispense Refill  . aspirin EC 81 MG tablet Take 1 tablet (81 mg total) by mouth daily. 30 tablet 6  . chlorthalidone (HYGROTON) 25 MG tablet Take 1 tablet (25 mg total) by mouth daily. 30 tablet 1  . labetalol (NORMODYNE) 300 MG tablet Take 2 tablets (600 mg total) by mouth 3 (three) times daily. 180 tablet 4  . losartan (COZAAR) 100 MG tablet TAKE ONE TABLET BY MOUTH ONCE DAILY FOR BLOOD PRESSURE 90 tablet 0  . pravastatin (PRAVACHOL) 10 MG tablet TAKE ONE TABLET BY MOUTH AT BEDTIME 30 tablet 0  . zolpidem (AMBIEN) 5 MG tablet Take 1 tablet (5 mg total) by mouth at bedtime as needed for sleep. 15 tablet 1   No current facility-administered medications for this visit.     PHYSICAL EXAMINATION: ECOG PERFORMANCE STATUS: 1 - Symptomatic but completely ambulatory  Vitals:   07/23/16 1040  BP: 135/82  Pulse: 66  Resp: 18    Temp: 98.3 F (36.8 C)   Filed Weights   07/23/16 1040  Weight: 204 lb (92.5 kg)    GENERAL:alert, no distress and comfortable SKIN: skin color, texture, turgor are normal, no rashes or significant lesions EYES: normal, Conjunctiva are pink and non-injected, sclera clear OROPHARYNX:no exudate, no erythema and lips, buccal mucosa, and tongue normal  NECK: supple, thyroid normal  size, non-tender, without nodularity LYMPH:  no palpable lymphadenopathy in the cervical, axillary or inguinal LUNGS: clear to auscultation and percussion with normal breathing effort HEART: regular rate & rhythm and no murmurs and no lower extremity edema ABDOMEN:abdomen soft, non-tender and normal bowel sounds MUSCULOSKELETAL:no cyanosis of digits and no clubbing  NEURO: alert & oriented x 3 with fluent speech, no focal motor/sensory deficits EXTREMITIES: No lower extremity edema  LABORATORY DATA:  I have reviewed the data as listed   Chemistry      Component Value Date/Time   NA 138 07/21/2016 1006   NA 141 04/08/2016 0904   K 3.5 07/21/2016 1006   K 4.7 04/08/2016 0904   CL 101 07/21/2016 1006   CO2 26 07/21/2016 1006   CO2 26 04/08/2016 0904   BUN 19 07/21/2016 1006   BUN 16.3 04/08/2016 0904   CREATININE 1.50 (H) 07/21/2016 1006   CREATININE 1.5 (H) 04/08/2016 0904      Component Value Date/Time   CALCIUM 9.8 07/21/2016 1006   CALCIUM 10.0 04/08/2016 0904   ALKPHOS 90 04/08/2016 0904   AST 25 04/08/2016 0904   ALT 23 04/08/2016 0904   BILITOT 0.58 04/08/2016 0904       Lab Results  Component Value Date   WBC 4.9 04/08/2016   HGB 12.6 04/08/2016   HCT 38.0 04/08/2016   MCV 93.6 04/08/2016   PLT 271 04/08/2016   NEUTROABS 3.1 04/08/2016     ASSESSMENT & PLAN:  Breast cancer of 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) 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, 1.3 cm, with DCIS, margins negative, 0/4 lymph nodes negative, T1 cN0 stage IA pathologic stage, ER 100%, PR 100%, HER-2 negative ratio 1.39, Ki-67 20% (Originally 2 nodules were detected on screening mammogram 1.9 cm= fat necrosis and 8 mm IDC) Oncotype DX score 20, 13% risk of recurrence, intermediate risk Adjuvant radiation therapy started 07/20/2017completed 07/13/2016  Recommendation: Adjuvant antiestrogen therapy with anastrozole 1 mg daily 5 years Anastrozole counseling:We discussed the  risks and benefits of anti-estrogen therapy with aromatase inhibitors. These include but not limited to insomnia, hot flashes, mood changes, vaginal dryness, bone density loss, and weight gain. We strongly believe that the benefits far outweigh the risks. Patient understands these risks and consented to starting treatment. Planned treatment duration is 5 years.  Return to clinic in 3 months for toxicity check and follow-up  No orders of the defined types were placed in this encounter.  The patient has a good understanding of the overall plan. she agrees with it. she will call with any problems that may develop before the next visit here.   ,  K, MD 07/23/16    

## 2016-07-27 NOTE — Progress Notes (Signed)
Patient ID: Michele Page                 DOB: 24-Sep-1956                      MRN: [id number]     HPI: Michele Page is a 60 y.o. female patient of Dr. Gwenlyn Found with [place] below who presents today for hypertension follow up. She reports having high blood pressure since her early 75s. She was recently diagnosed with breast cancer and recently finished radiation treatment.  At her last visit she refused to try any blood pressure medications except chlorthalidone. She also states she has applied for LIS, but she does not know where she is in the process - she reports she will call again today.   She recently started chlorthalidone - labs were stable 2 weeks after starting, though she reports that she had already stopped the medication when labs drawn. Stopped chlorthalidone after 2 weeks (of which she took 1 tablet for a few days then self titrated to 2 tablets daily).   Reports her pressures are normal at all times, except 4-6pm. She reports that during this time her pressures run high. Though she has previous discontinued nifedipine she reports she has taken this medication in the afternoons to help with pressures. Of not she reports that the medication has not changed her pressure numbers (but she states that it does lower her blood pressure - so unclear exactly what is happening to pressure).   The patient is an extremely poor historian when it comes to her medications and it is questionable what she is actually taking at home, because several times during this visit she mentions that she takes losartan 2 times a day and when questioned she corrects herself to once a day. She refuses to change losartan and labetalol because she reports these medications work she just needs more.   Current HTN meds:  Labetalol 600mg TID (8am, 3pm, 9pm) Losartan 100mg  daily (8am) Nifedipine PRN??  Previously tried: nifedipine - causes her heart to race Nicardipine - too expensive Chlorthalidone - no BP effect per patient  report Amlodipine - no BP effect per patient report  Cardiac [id number] 4 years ago (age 89) with some residual expressive aphasia, HTN  Family Hx: mother died from stroke at 76, sister from stroke in her 13's; both believed to be related to ongoing hypertension; father died from MI at 24; She has 5 brothers (2 still living), but none had/have cardiovascular issues  Social Hx: quit tobacco 15+ years ago; only rare alcohol, drinks 1 cup coffee per day at most  Diet: eats almost all meals at home, only small amounts of salt used. Currently eating larger portions of vegetables, cut meats out and eats only chicken  Exercise: gym 5 days per week; stays about 2 hours doing both cardio and strength training  Home BP readings:  Per her log with no times her pressure avg 160s/80s. Her cuff has previously been verified in office. Of note the many of the measurements end in 0.   Wt Readings from Last 3 Encounters:  07/23/16 204 lb (92.5 kg)  07/10/16 203 lb 3.2 oz (92.2 kg)  07/02/16 202 lb (91.6 kg)   BP Readings from Last 3 Encounters:  07/28/16 (!) 144/82  07/23/16 135/82  07/10/16 135/81   Pulse Readings from Last 3 Encounters:  07/28/16 75  07/23/16 66  07/10/16 63    Renal function: Estimated Creatinine Clearance: 43.1 mL/min (by  C-G formula based on SCr of 1.5 mg/dL).  Past Medical History:  Diagnosis Date  . Arthritis    lt knee  . Breast cancer ([place])   . Breast cancer of upper-outer quadrant of left female breast ([place]) 04/02/2016  . Colon polyps   . COPD (chronic obstructive pulmonary disease) ([place])   . Hyperlipidemia   . Hypertension   . Multiple sclerosis ([place])   . Stroke ([place])    2013, mild cognitive deficits  . Vision abnormalities     Current Outpatient Prescriptions on File Prior to Visit  Medication Sig Dispense Refill  . anastrozole (ARIMIDEX) 1 MG tablet Take 1 tablet (1 mg total) by mouth daily. 90 tablet 3  . aspirin EC 81 MG tablet Take 1 tablet  (81 mg total) by mouth daily. 30 tablet 6  . labetalol (NORMODYNE) 300 MG tablet Take 2 tablets (600 mg total) by mouth 3 (three) times daily. 180 tablet 4  . losartan (COZAAR) 100 MG tablet TAKE ONE TABLET BY MOUTH ONCE DAILY FOR BLOOD PRESSURE 90 tablet 0  . pravastatin (PRAVACHOL) 10 MG tablet TAKE ONE TABLET BY MOUTH AT BEDTIME 30 tablet 0  . zolpidem (AMBIEN) 5 MG tablet Take 1 tablet (5 mg total) by mouth at bedtime as needed for sleep. 15 tablet 1   No current facility-administered medications on file prior to visit.     No Known Allergies  Blood pressure (!) 144/82, pulse 75.   Assessment/Plan: Hypertension: BP is borderline controlled today in office. However her home pressures have been elevated and patient continually repeats that her pressures are high between 4-6pm. Attempted to have her try something prn for high pressures during this time but she refuses and again states she needs more than 25mg  of medicaiton. She continually repeats that she has had high blood pressure for 40 years and she knows what her body needs and its more than 25mg . We again had discussion about doses of drugs being apples and oranges. I think ultimately the best thing for her would be to get on LIS and go back to nicardipine (she reports this has worked in the past, but was too expensive), but she refuses to leave today without an additional medication to help her blood pressure until she can get nicardipine. She states she will try hydralazine 100mg  twice daily but that her body will need more than this dose. I again instructed her to stay with the prescribed dose as it could be dangerous to self titrate. She states she understands. Have again asked she follow up with LIS so that we can hopefully put her back on nicardipine. She will follow up in hypertension clinic in 3 weeks. Instructed her to bring logs, pills and cuff to appt.    Thank you, Lelan Pons. Patterson Hammersmith, [facility]    07/28/2016 11:31 AM

## 2016-07-28 ENCOUNTER — Ambulatory Visit (INDEPENDENT_AMBULATORY_CARE_PROVIDER_SITE_OTHER): Payer: Commercial Managed Care - HMO | Admitting: Pharmacist

## 2016-07-28 VITALS — BP 144/82 | HR 75

## 2016-07-28 DIAGNOSIS — I10 Essential (primary) hypertension: Secondary | ICD-10-CM

## 2016-07-28 MED ORDER — HYDRALAZINE HCL 100 MG PO TABS: 100.0000 mg | ORAL_TABLET | Freq: Two times a day (BID) | ORAL | 0 refills | Status: DC

## 2016-07-28 NOTE — Patient Instructions (Addendum)
Return for a a follow up appointment in 3 weeks  Check your blood pressure at home daily (if able) and keep record of the readings.  Take your BP meds as follows:  Start taking hydralazine 100mg  twice daily   Bring all of your meds, your BP cuff and your record of home blood pressures to your next appointment.  Exercise as you're able, try to walk approximately 30 minutes per day.  Keep salt intake to a minimum, especially watch canned and prepared boxed foods.  Eat more fresh fruits and vegetables and fewer canned items.  Avoid eating in fast food restaurants.    HOW TO TAKE YOUR BLOOD PRESSURE: . Rest 5 minutes before taking your blood pressure. .  Don't smoke or drink caffeinated beverages for at least 30 minutes before. . Take your blood pressure before (not after) you eat. . Sit comfortably with your back supported and both feet on the floor (don't cross your legs). . Elevate your arm to heart level on a table or a desk. . Use the proper sized cuff. It should fit smoothly and snugly around your bare upper arm. There should be enough room to slip a fingertip under the cuff. The bottom edge of the cuff should be 1 inch above the crease of the elbow. . Ideally, take 3 measurements at one sitting and record the average.

## 2016-08-06 ENCOUNTER — Ambulatory Visit (INDEPENDENT_AMBULATORY_CARE_PROVIDER_SITE_OTHER): Payer: Commercial Managed Care - HMO | Admitting: Nurse Practitioner

## 2016-08-06 ENCOUNTER — Encounter: Payer: Self-pay | Admitting: Nurse Practitioner

## 2016-08-06 ENCOUNTER — Other Ambulatory Visit: Payer: Self-pay | Admitting: Internal Medicine

## 2016-08-06 VITALS — BP 122/82 | HR 75 | Temp 98.4°F | Resp 18 | Ht 63.0 in | Wt 206.0 lb

## 2016-08-06 DIAGNOSIS — Z23 Encounter for immunization: Secondary | ICD-10-CM | POA: Diagnosis not present

## 2016-08-06 DIAGNOSIS — R600 Localized edema: Secondary | ICD-10-CM | POA: Diagnosis not present

## 2016-08-06 NOTE — Progress Notes (Signed)
Careteam: Patient Care Team: Gildardo Cranker, DO as PCP - General (Internal Medicine) Nicholas Lose, MD as Consulting Physician (Oncology) Erroll Luna, MD as Consulting Physician (General Surgery) Thea Silversmith, MD as Consulting Physician (Radiation Oncology)  Advanced Directive information Does patient have an advance directive?: No  No Known Allergies  Chief Complaint  Patient presents with  . Acute Visit    Left leg swollen x few weeks and vein in calf looks darker than normal since last night  . Other    Pt does not want flu vaccine     HPI: Patient is a 60 y.o. female seen in the office today due to vein showing on her left leg and a knot. Also reports discoloration to the back of her left knee.  Pt with hx of CVA, htn,  Hyperlipidemia, MS.  hpt with hx of left breast CA stage 1A (T1b, N0, M0) invasive ductal grade 2/DCIS and is s/p lumpectomy with radiation. Just completed last round of radiation.  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  Respiratory: Negative for cough and shortness of breath.   Cardiovascular: Positive for leg swelling (left leg). Negative for chest pain.  Musculoskeletal: Positive for myalgias. Negative for arthralgias and gait problem.  Skin: Positive for color change. Negative for wound.  Psychiatric/Behavioral: Positive for confusion.    Past Medical History:  Diagnosis Date  . Arthritis    lt knee  . Breast cancer ([place])   . Breast cancer of upper-outer quadrant of left female breast ([place]) 04/02/2016  . Colon polyps   . COPD (chronic obstructive pulmonary disease) ([place])   . Hyperlipidemia   . Hypertension   . Multiple sclerosis ([place])   . Stroke ([place])    2013, mild cognitive deficits  . Vision abnormalities    Past Surgical History:  Procedure Laterality Date    . HYSTEROSCOPY W/D&C N/A 12/19/2015   Procedure: DILATATION AND CURETTAGE /HYSTEROSCOPY;  Surgeon: Linda Hedges, DO;  Location: [facility];  Service: Gynecology;  Laterality: N/A;  . RADIOACTIVE SEED GUIDED MASTECTOMY WITH AXILLARY SENTINEL LYMPH NODE BIOPSY Left 04/17/2016   Procedure: RADIOACTIVE SEED GUIDED PARTIAL MASTECTOMY WITH AXILLARY SENTINEL LYMPH NODE BIOPSY;  Surgeon: Erroll Luna, MD;  Location: [facility];  Service: General;  Laterality: Left;  RADIOACTIVE SEED GUIDED PARTIAL MASTECTOMY WITH AXILLARY SENTINEL LYMPH NODE BIOPSY   . TUBAL LIGATION    . VAGINAL DELIVERY     x3   Social History:   reports that she quit smoking about 14 years ago. Her smoking use included Cigarettes. She has a 12.50 pack-year smoking history. She has never used smokeless tobacco. She reports that she drinks alcohol. She reports that she does not use drugs.  Family History  Problem Relation Age of Onset  . Stroke Sister   . Hypertension Mother   . Stroke Mother   . Alcohol abuse Brother   . HIV/AIDS Brother     Medications: Patient's Medications  New Prescriptions   No medications on file  Previous Medications   ANASTROZOLE (ARIMIDEX) 1 MG TABLET    Take 1 tablet (1 mg total) by mouth daily.   ASPIRIN EC 81 MG TABLET    Take 1 tablet (81 mg total) by mouth daily.   LABETALOL (NORMODYNE) 300 MG TABLET    Take 2 tablets (  600 mg total) by mouth 3 (three) times daily.   LOSARTAN (COZAAR) 100 MG TABLET    TAKE ONE TABLET BY MOUTH ONCE DAILY FOR BLOOD PRESSURE   PRAVASTATIN (PRAVACHOL) 10 MG TABLET    TAKE ONE TABLET BY MOUTH AT BEDTIME  Modified Medications   No medications on file  Discontinued Medications   HYDRALAZINE (APRESOLINE) 100 MG TABLET    Take 1 tablet (100 mg total) by mouth 2 (two) times daily.   ZOLPIDEM (AMBIEN) 5 MG TABLET    Take 1 tablet (5 mg total) by mouth at bedtime as needed for sleep.     Physical Exam:  Vitals:   08/06/16 1346  BP: 122/82  Pulse: 75   Resp: 18  Temp: 98.4 F (36.9 C)  TempSrc: Oral  SpO2: 97%  Weight: 206 lb (93.4 kg)  Height: 5\' 3"  (1.6 m)   Body mass index is 36.49 kg/m.  Physical Exam  Constitutional: She appears well-developed and well-nourished.  Cardiovascular: Normal rate, regular rhythm and normal heart sounds.   Pulmonary/Chest: Effort normal and breath sounds normal. No respiratory distress. She has no wheezes.  Musculoskeletal: Normal range of motion. She exhibits edema and tenderness.  Left leg with increase in size, no pitting edema or edema to foot of ankle bilaterally, bruising noted along lateral aspect of left calf Spider veins noted to posterior knee  Neurological: She is alert.  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    Labs reviewed: Basic Metabolic Panel:  Recent Labs  10/22/15 1052 12/18/15 1125 04/08/16 0904 07/21/16 1006  NA 141 140 141 138  K 4.1 3.9 4.7 3.5  CL 102 104  --  101  CO2  --  28 26 26   GLUCOSE 96 100* 124 93  BUN 15 21* 16.3 19  CREATININE 1.40* 1.47* 1.5* 1.50*  CALCIUM  --  9.4 10.0 9.8   Liver Function Tests:  Recent Labs  01/10/16 1017 04/08/16 0904  AST  --  25  ALT 26 23  ALKPHOS  --  90  BILITOT  --  0.58  PROT  --  7.6  ALBUMIN  --  4.3   No results for input(s): LIPASE, AMYLASE in the last 8760 hours. No results for input(s): AMMONIA in the last 8760 hours. CBC:  Recent Labs  10/22/15 1052 12/18/15 1125 04/08/16 0904  WBC  --  5.3 4.9  NEUTROABS  --   --  3.1  HGB 12.9 12.1 12.6  HCT 38.0 35.6* 38.0  MCV  --  93.0 93.6  PLT  --  295 271   Lipid Panel:  Recent Labs  01/10/16 1017 05/13/16 1127  CHOL 203* 196  HDL 75 64  LDLCALC 112* 110*  TRIG 80 110  CHOLHDL 2.7 3.1   TSH: No results for input(s): TSH in the last 8760 hours. A1C: No results found for: HGBA1C   Assessment/Plan .1. Edema of left lower extremity -appears to be due to contusion however pt does not remember hitting her leg. Due to  hx of breast cancer and edema will get [place] to rule out DVT.  - VAS [place] LOWER EXTREMITY VENOUS (DVT); Future  2. Encounter for immunization - Flu Vaccine QUAD 36+ mos IM   Natalio Salois K. Harle Battiest  [facility] [phone] 8 am - 5 pm) [phone] (after hours)

## 2016-08-10 NOTE — Progress Notes (Signed)
Mrs. Ellenwood is here for a follow up visit for a one month visit for breast cancer upper-outer quadrant of female breast.  Skin status:Left breast nipple with hyperpigmentation What lotion are you using? Has not been using a lotion with vitamin E, will purchase some today. Has been using the Radiaplex gel Have you seen med onc? If not, when is appointment? 07-23-16 Dr. Lindi Adie next appointment 11-01-16 If they are ER+, have they started Al or Tamoxifen? If not, why? 07-23-16 Anastrozole Discuss survivorship appointment. 10-12-16 Mike Craze, P.A. Have you had a mammogram scheduled? No Offer referral to [facility]. Will receive during the survivorship appointment Appetite:Good Pain:No Fatigue:No Arm mobility:Raising left arm without difficulty Wt Readings from Last 3 Encounters:  08/11/16 201 lb 6.4 oz (91.4 kg)  08/06/16 206 lb (93.4 kg)  07/23/16 204 lb (92.5 kg)  BP 139/90 (BP Location: Right Leg, Patient Position: Sitting, Cuff Size: Normal)   Pulse 64   Temp 97.8 F (36.6 C) (Oral)   Resp 18   Ht 5\' 3"  (1.6 m)   Wt 201 lb 6.4 oz (91.4 kg)   SpO2 100%   BMI 35.68 kg/m

## 2016-08-11 ENCOUNTER — Other Ambulatory Visit: Payer: Self-pay | Admitting: Internal Medicine

## 2016-08-11 ENCOUNTER — Ambulatory Visit
Admission: RE | Admit: 2016-08-11 | Discharge: 2016-08-11 | Disposition: A | Discharge status: Home / Self Care | Payer: Commercial Managed Care - HMO | Source: Ambulatory Visit | Attending: Radiation Oncology | Admitting: Radiation Oncology

## 2016-08-11 ENCOUNTER — Ambulatory Visit (HOSPITAL_COMMUNITY)
Admission: RE | Admit: 2016-08-11 | Discharge: 2016-08-11 | Disposition: A | Discharge status: Home / Self Care | Payer: Commercial Managed Care - HMO | Source: Ambulatory Visit | Attending: Vascular Surgery | Admitting: Vascular Surgery

## 2016-08-11 ENCOUNTER — Telehealth: Payer: Self-pay | Admitting: *Deleted

## 2016-08-11 VITALS — BP 139/90 | HR 64 | Temp 97.8°F | Resp 18 | Ht 63.0 in | Wt 201.4 lb

## 2016-08-11 DIAGNOSIS — Z170 Estrogen receptor positive status [ER+]: Secondary | ICD-10-CM | POA: Insufficient documentation

## 2016-08-11 DIAGNOSIS — Z7982 Long term (current) use of aspirin: Secondary | ICD-10-CM | POA: Diagnosis not present

## 2016-08-11 DIAGNOSIS — F419 Anxiety disorder, unspecified: Secondary | ICD-10-CM | POA: Diagnosis not present

## 2016-08-11 DIAGNOSIS — C50412 Malignant neoplasm of upper-outer quadrant of left female breast: Secondary | ICD-10-CM

## 2016-08-11 DIAGNOSIS — C50912 Malignant neoplasm of unspecified site of left female breast: Secondary | ICD-10-CM | POA: Insufficient documentation

## 2016-08-11 DIAGNOSIS — R600 Localized edema: Secondary | ICD-10-CM | POA: Diagnosis not present

## 2016-08-11 NOTE — Telephone Encounter (Signed)
Manuela Schwartz with [facility] called and stated that patient's Doppler came back NEGATIVE. Please Advise.

## 2016-08-11 NOTE — Progress Notes (Signed)
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  Interval Since Last Radiation: 4 weeks   05/27/16- 07/13/16 1. Left breast treated with breath hold to 50.4 Gy in 28 fractions at 1.8 Gy/fraction 2. Left breast boosted to 10 Gy in 5 fractions at 2 Gy/fraction  Narrative:  The patient returns today for routine follow-up.  The patient did great during radiation treatment and did not develop any desquamation.                               On review of systems, the patient states she is doing very well. She is anxious about her concerns for her cancer coming back. She started antiestrogen therapy with Dr. Lindi Adie, and is tolerating this well. She reports she would like to lose weight as well. She is also interested in getting involved in the cancer community and has signed up for two 5K races in the next two months. She denies concerns with her skin at this point. No other complaints are noted.  ALLERGIES:  has No Known Allergies.  Meds: Current Outpatient Prescriptions  Medication Sig Dispense Refill  . anastrozole (ARIMIDEX) 1 MG tablet Take 1 tablet (1 mg total) by mouth daily. 90 tablet 3  . aspirin EC 81 MG tablet Take 1 tablet (81 mg total) by mouth daily. 30 tablet 6  . labetalol (NORMODYNE) 300 MG tablet Take 2 tablets (600 mg total) by mouth 3 (three) times daily. 180 tablet 4  . losartan (COZAAR) 100 MG tablet TAKE ONE TABLET BY MOUTH ONCE DAILY FOR BLOOD PRESSURE 90 tablet 0  . pravastatin (PRAVACHOL) 10 MG tablet TAKE ONE TABLET BY MOUTH AT BEDTIME 30 tablet 0   No current facility-administered medications for this encounter.     Physical Findings:  height is 5\' 3"  (1.6 m) and weight is 201 lb 6.4 oz (91.4 kg). Her oral  temperature is 97.8 F (36.6 C). Her blood pressure is 139/90 and her pulse is 64. Her respiration is 18 and oxygen saturation is 100%.  In general this is a well appearing African American female in no acute distress. She's alert and oriented x4 and appropriate throughout the examination. Cardiopulmonary assessment is negative for acute distress and she exhibits normal effort. Her left breast is assessed, and minimal hyperpigmentation is noted of the breast and axillary line. No desquamation is noted.  Lab Findings: Lab Results  Component Value Date   WBC 4.9 04/08/2016   HGB 12.6 04/08/2016   HCT 38.0 04/08/2016   MCV 93.6 04/08/2016   PLT 271 04/08/2016     Radiographic Findings: No results found.  Impression/Plan: 1. 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 The patient appears to be doing well. We reviewed skin care with Vitamin E and sunscreen. She will continue with Dr. Lindi Adie for estrogen blockade. We would be happy to see her in the future if she has questions or concerns regarding her previous therapy. 2. Survivorship. The patient is scheduled in survivorship clinic in November. I've also given her a copy of this [facility] calendar, and explained survivorship.  3. Anxiety about risk of recurrence. She is  interested in speaking with social work about the counseling options here at the cancer center, and a referral is placed. 4. Desires for weight loss. I have discussed the [employer] program and its benefits for exercise in a monitored and safe environment for cancer patients and filled out a referral form for the patient to participate.    Carola Rhine, PAC

## 2016-08-11 NOTE — Addendum Note (Signed)
Encounter addended by: Malena Edman, RN on: 08/11/2016 10:57 AM<BR>    Actions taken: Charge Capture section accepted

## 2016-08-11 NOTE — Telephone Encounter (Signed)
LMOM to return call.

## 2016-08-11 NOTE — Telephone Encounter (Signed)
Patient notified and agreed.  

## 2016-08-11 NOTE — Telephone Encounter (Signed)
Please advise pt of result

## 2016-08-14 ENCOUNTER — Encounter: Payer: Self-pay | Admitting: Internal Medicine

## 2016-08-14 ENCOUNTER — Ambulatory Visit (INDEPENDENT_AMBULATORY_CARE_PROVIDER_SITE_OTHER): Payer: Commercial Managed Care - HMO | Admitting: Internal Medicine

## 2016-08-14 VITALS — BP 122/76 | HR 67 | Temp 98.2°F | Ht 63.0 in | Wt 201.0 lb

## 2016-08-14 DIAGNOSIS — F09 Unspecified mental disorder due to known physiological condition: Secondary | ICD-10-CM | POA: Diagnosis not present

## 2016-08-14 DIAGNOSIS — G4700 Insomnia, unspecified: Secondary | ICD-10-CM | POA: Diagnosis not present

## 2016-08-14 DIAGNOSIS — I10 Essential (primary) hypertension: Secondary | ICD-10-CM | POA: Diagnosis not present

## 2016-08-14 DIAGNOSIS — I6930 Unspecified sequelae of cerebral infarction: Secondary | ICD-10-CM

## 2016-08-14 DIAGNOSIS — S8012XD Contusion of left lower leg, subsequent encounter: Secondary | ICD-10-CM | POA: Diagnosis not present

## 2016-08-14 DIAGNOSIS — E785 Hyperlipidemia, unspecified: Secondary | ICD-10-CM

## 2016-08-14 DIAGNOSIS — C50412 Malignant neoplasm of upper-outer quadrant of left female breast: Secondary | ICD-10-CM | POA: Diagnosis not present

## 2016-08-14 NOTE — Progress Notes (Signed)
Patient ID: Michele Page, female   DOB: [birth year], 60 y.o.   MRN: [id number]    Location:  [facility] Place of Service: OFFICE  Chief Complaint  Patient presents with  . Medical Management of Chronic Issues    3 month routine visit    HPI:  61 yo female seen today for f/u. She was seen by [name], NP for LLE swelling last week. She was sent for venous doppler US LLE - neg for DVT. Pt does not recall any injury. Bruise is resolving and swelling improved. No pain. She does have occasional neck pain but not sure if it is related to the way she is sleeping. No recent falls. No other concerns  She is a poor historian due to expressive aphasia/memory loss. Hx obtained from chart   Left breast CA - stage 1A (T1b, N0, M0) invasive ductal grade 2/DCIS and is s/p lumpectomy with AND. She completed XRT about 2 weeks ago. She was told no chemotx needed. She has trouble falling asleep since dx.  Nothing tried OTC. Charolette Forward in the past but felt like it was ineffective. Started arimidex and will take it x 5 yrs.  HTN - stable on labetalol and losartan. BP at home 130s/70-80s. She does take meds at same time every day. Followed by cardio  Hyperlipidemia - stable on pravastatin. LDL 110  COPD - no exacerbations. No issues with breathing  Hx CVA - no new sx's. Takes statin and ASA daily. LDL 99  MS - no obvious sx's - no diplopia but she cannot "stare" for prolonged time without blinking.  Gastritis/GERD - she stopped her nexium on her own.  She had a colonoscopy last yr and tubular adenoma polyps removed. No acid reflux sx's.  CKD - probably due to HTN. Cr stable at 1.50  Past Medical History:  Diagnosis Date  . Arthritis    lt knee  . Breast cancer ([place])   . Breast cancer of upper-outer quadrant of left female breast ([place]) 04/02/2016  . Colon polyps   . COPD (chronic obstructive pulmonary disease) ([place])   . Hyperlipidemia   . Hypertension   . Multiple sclerosis ([place])   . Stroke ([place])      2013, mild cognitive deficits  . Vision abnormalities     Past Surgical History:  Procedure Laterality Date  . HYSTEROSCOPY W/D&C N/A 12/19/2015   Procedure: DILATATION AND CURETTAGE /HYSTEROSCOPY;  Surgeon: Linda Hedges, DO;  Location: [facility];  Service: Gynecology;  Laterality: N/A;  . RADIOACTIVE SEED GUIDED MASTECTOMY WITH AXILLARY SENTINEL LYMPH NODE BIOPSY Left 04/17/2016   Procedure: RADIOACTIVE SEED GUIDED PARTIAL MASTECTOMY WITH AXILLARY SENTINEL LYMPH NODE BIOPSY;  Surgeon: Erroll Luna, MD;  Location: [facility];  Service: General;  Laterality: Left;  RADIOACTIVE SEED GUIDED PARTIAL MASTECTOMY WITH AXILLARY SENTINEL LYMPH NODE BIOPSY   . TUBAL LIGATION    . VAGINAL DELIVERY     x3    Patient Care Team: Gildardo Cranker, DO as PCP - General (Internal Medicine) Nicholas Lose, MD as Consulting Physician (Oncology) Erroll Luna, MD as Consulting Physician (General Surgery) Thea Silversmith, MD as Consulting Physician (Radiation Oncology)  Social History   Social History  . Marital status: Married    Spouse name: N/A  . Number of children: 3  . Years of education: N/A   Occupational History  . retired    Social History Main Topics  . Smoking status: Former Smoker    Packs/day: 0.50    Years: 25.00  Types: Cigarettes    Quit date: 05/21/2002  . Smokeless tobacco: Never Used  . Alcohol use 0.0 oz/week     Comment: occas  . Drug use: No  . Sexual activity: No   Other Topics Concern  . Not on file   Social History Narrative   Diet- N/A   Caffeine- Yes   Married- Yes   House- 2 story with 2 people   Pets- No   Current/past profession- Nurse, mental health   Exercise- Yes   Living will-No   DNR-N/A   POA/HPOA-No           reports that she quit smoking about 14 years ago. Her smoking use included Cigarettes. She has a 12.50 pack-year smoking history. She has never used smokeless tobacco. She reports that she drinks alcohol. She reports that she  does not use drugs.  Family History  Problem Relation Age of Onset  . Stroke Sister   . Hypertension Mother   . Stroke Mother   . Alcohol abuse Brother   . HIV/AIDS Brother    Family Status  Relation Status  . Sister Deceased  . Father Deceased at age 63  . Mother Deceased at age 3  . Brother Deceased  . Brother Deceased  . Brother Deceased     No Known Allergies  Medications: Patient's Medications  New Prescriptions   No medications on file  Previous Medications   ANASTROZOLE (ARIMIDEX) 1 MG TABLET    Take 1 tablet (1 mg total) by mouth daily.   ASPIRIN EC 81 MG TABLET    Take 1 tablet (81 mg total) by mouth daily.   LABETALOL (NORMODYNE) 300 MG TABLET    Take 2 tablets (600 mg total) by mouth 3 (three) times daily.   LOSARTAN (COZAAR) 100 MG TABLET    TAKE ONE TABLET BY MOUTH ONCE DAILY FOR BLOOD PRESSURE   PRAVASTATIN (PRAVACHOL) 10 MG TABLET    TAKE ONE TABLET BY MOUTH ONCE DAILY AT BEDTIME  Modified Medications   No medications on file  Discontinued Medications   No medications on file    Review of Systems  Unable to perform ROS: Other  memory loss;expressive aphasia  Vitals:   08/14/16 1034  BP: 122/76  Pulse: 67  Temp: 98.2 F (36.8 C)  TempSrc: Oral  SpO2: 96%  Weight: 201 lb (91.2 kg)  Height: 5\' 3"  (1.6 m)   Body mass index is 35.61 kg/m.  Physical Exam  Constitutional: She appears well-developed and well-nourished.  HENT:  Mouth/Throat: Oropharynx is clear and moist. No oropharyngeal exudate.  Eyes: Pupils are equal, round, and reactive to light. No scleral icterus.  Neck: Neck supple. Carotid bruit is not present. No tracheal deviation present. No thyromegaly present.  Cardiovascular: Normal rate, regular rhythm and intact distal pulses.  Exam reveals no gallop and no friction rub.   Murmur (1/6) heard. No LE edema b/l. no calf TTP. No LUE edema  Pulmonary/Chest: Effort normal and breath sounds normal. No stridor. No respiratory distress.  She has no wheezes. She has no rales. Right breast exhibits no inverted nipple, no mass, no nipple discharge, no skin change and no tenderness. Left breast exhibits no inverted nipple, no mass, no nipple discharge, no skin change and no tenderness. Breasts are asymmetrical.  Abdominal: Soft. Bowel sounds are normal. She exhibits no distension and no mass. There is no hepatomegaly. There is no tenderness. There is no rebound and no guarding.  Musculoskeletal: She exhibits edema.  Left  lateral leg swelling with yellowish contusion, NT; right neck muscle hypertrophy with ropy tissue texture changes  Lymphadenopathy:    She has no cervical adenopathy.  Neurological: She is alert.  Skin: Skin is warm and dry. No rash noted.  Psychiatric: She has a normal mood and affect. Her behavior is normal. Thought content normal. Her speech is slurred.     Labs reviewed: Telephone on 07/16/2016  Component Date Value Ref Range Status  . Sodium 07/22/2016 138  135 - 146 mmol/L Final  . Potassium 07/22/2016 3.5  3.5 - 5.3 mmol/L Final  . Chloride 07/22/2016 101  98 - 110 mmol/L Final  . CO2 07/22/2016 26  20 - 31 mmol/L Final  . Glucose, Bld 07/22/2016 93  65 - 99 mg/dL Final  . BUN 07/22/2016 19  7 - 25 mg/dL Final  . Creat 07/22/2016 1.50* 0.50 - 0.99 mg/dL Final   Comment:   For patients > or = 61 years of age: The upper reference limit for Creatinine is approximately 13% higher for people identified as African-American.     . Calcium 07/22/2016 9.8  8.6 - 10.4 mg/dL Final    No results found.   Assessment/Plan   ICD-9-CM ICD-10-CM   1. Contusion of left leg, subsequent encounter V58.89 S80.12XD    924.5    2. Cognitive disorder 294.9 F09   3. Breast cancer of 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) 174.4 C50.412   4. Essential hypertension 401.9 I10   5. History of stroke with residual deficit 438.9 I69.30   6. Hyperlipidemia 272.4 E78.5   7. Insomnia 780.52 G47.00      She  will let me know the name of the medication that has helped joint crepitus in the past. She does not recall it at this time  Continue current medications as ordered  Follow up with specialists as scheduled  Follow up in 3 mos for routine visit   Teddrick Mallari S. Perlie Gold  [facility] [address] [place], [place] [address] [phone] Cell (Monday-Friday 8 AM - 5 PM) [phone] After 5 PM and follow prompts

## 2016-08-14 NOTE — Patient Instructions (Addendum)
Continue current medications as ordered  Follow up with specialists as scheduled  Follow up in 3 mos for routine visit   

## 2016-08-25 ENCOUNTER — Ambulatory Visit: Payer: Self-pay | Admitting: Radiation Oncology

## 2016-08-25 ENCOUNTER — Ambulatory Visit
Admission: RE | Admit: 2016-08-25 | Discharge: 2016-08-25 | Disposition: A | Discharge status: Home / Self Care | Payer: Commercial Managed Care - HMO | Source: Ambulatory Visit | Attending: Radiation Oncology | Admitting: Radiation Oncology

## 2016-08-28 ENCOUNTER — Ambulatory Visit (INDEPENDENT_AMBULATORY_CARE_PROVIDER_SITE_OTHER): Payer: Commercial Managed Care - HMO | Admitting: Pharmacist Clinician (PhC)/ Clinical Pharmacy Specialist

## 2016-08-28 DIAGNOSIS — I10 Essential (primary) hypertension: Secondary | ICD-10-CM

## 2016-08-28 NOTE — Assessment & Plan Note (Addendum)
Patient with longstanding hypertension, now appears well controlled on her current regimen.  Will not make any changes and have asked her to continue with home monitoring.  She can call the office should she notice elevated readings in the future.

## 2016-08-28 NOTE — Progress Notes (Signed)
Patient ID: Michele Page                 DOB: 26-Aug-1956                      MRN: [id number]     HPI: Michele Page is a 60 y.o. female patient of Dr. Gwenlyn Found with [facility] who presents today for hypertension follow up. She reports having high blood pressure since her early 18s. She was previously diagnosed with breast cancer and recently finished radiation treatment earlier this year.   We have tried to make some changes to her medications in the past to get better control, however she has remained resistant to changes and will go back to her whatever she feels is best.  She has a strong belief that she should always take high milligram amounts of medications.  She does not believe anything that comes less than 25 mg would ever be enough to have an effect on her body.  We tried to explain how medication doses work, but she continues with that belief.  She has previously reported her pressures were normal at all times, except 4-6pm.  [name] started her on hydralazine 100 mg bid (patient had wanted to start at 100 mg tid) , however she only took for a few doses before changing her regimen and today she reports taking the following  Current HTN meds:  Labetalol 600mg TID (8am, 3pm, 9pm) Losartan 100mg  daily (8am) Nifedipine XL 60 mg qd (3pm)  Previously tried: nifedipine - causes her heart to race Nicardipine - too expensive Chlorthalidone - no BP effect per patient report Amlodipine - no BP effect per patient report  Cardiac [id number] 4 years ago (age 36) with some residual expressive aphasia, HTN  Family Hx: mother died from stroke at 85, sister from stroke in her 37's; both believed to be related to ongoing hypertension; father died from MI at 14; She has 5 brothers (2 still living), but none had/have cardiovascular issues  Social Hx: quit tobacco 15+ years ago; only rare alcohol, drinks 1 cup coffee per day at most  Diet: eats almost all meals at home, only small amounts of salt used.  Currently eating larger portions of vegetables, cut meats out and eats only chicken  Exercise: gym 5 days per week; stays about 2 hours doing both cardio and strength training  Home BP readings:  She reports no elevated readings at home over the past month since she went back to nifedipine xl.  Did not bring her home readings with her today.   Wt Readings from Last 3 Encounters:  08/14/16 201 lb (91.2 kg)  08/11/16 201 lb 6.4 oz (91.4 kg)  08/06/16 206 lb (93.4 kg)   BP Readings from Last 3 Encounters:  08/14/16 122/76  08/11/16 139/90  08/06/16 122/82   Pulse Readings from Last 3 Encounters:  08/14/16 67  08/11/16 64  08/06/16 75    Renal function: CrCl cannot be calculated (Patient's most recent lab result is older than the maximum 21 days allowed.).  Past Medical History:  Diagnosis Date  . Arthritis    lt knee  . Breast cancer ([place])   . Breast cancer of upper-outer quadrant of left female breast ([place]) 04/02/2016  . Colon polyps   . COPD (chronic obstructive pulmonary disease) ([place])   . Hyperlipidemia   . Hypertension   . Multiple sclerosis ([place])   . Stroke ([place])    2013, mild cognitive deficits  .  Vision abnormalities     Current Outpatient Prescriptions on File Prior to Visit  Medication Sig Dispense Refill  . anastrozole (ARIMIDEX) 1 MG tablet Take 1 tablet (1 mg total) by mouth daily. 90 tablet 3  . aspirin EC 81 MG tablet Take 1 tablet (81 mg total) by mouth daily. 30 tablet 6  . labetalol (NORMODYNE) 300 MG tablet Take 2 tablets (600 mg total) by mouth 3 (three) times daily. 180 tablet 4  . losartan (COZAAR) 100 MG tablet TAKE ONE TABLET BY MOUTH ONCE DAILY FOR BLOOD PRESSURE 90 tablet 0  . pravastatin (PRAVACHOL) 10 MG tablet TAKE ONE TABLET BY MOUTH ONCE DAILY AT BEDTIME 30 tablet 5   No current facility-administered medications on file prior to visit.     No Known Allergies  Assessment/Plan: Patient with longstanding hypertension, now appears well  controlled on her current regimen.  Will not make any changes and have asked her to continue with home monitoring.  She can call the office should she notice elevated readings in the future.   Thank you, Tommy Medal, PharmD CPP  Jay  08/28/2016 10:17 AM

## 2016-08-28 NOTE — Patient Instructions (Signed)
Call the office should you note an increase in BP readings at home  Your blood pressure today is 132/82  Check your blood pressure at home daily and keep record of the readings.  Take your BP meds as follows: continue with all current medications  Bring all of your meds, your BP cuff and your record of home blood pressures to your next appointment.  Exercise as you're able, try to walk approximately 30 minutes per day.  Keep salt intake to a minimum, especially watch canned and prepared boxed foods.  Eat more fresh fruits and vegetables and fewer canned items.  Avoid eating in fast food restaurants.    HOW TO TAKE YOUR BLOOD PRESSURE: . Rest 5 minutes before taking your blood pressure. .  Don't smoke or drink caffeinated beverages for at least 30 minutes before. . Take your blood pressure before (not after) you eat. . Sit comfortably with your back supported and both feet on the floor (don't cross your legs). . Elevate your arm to heart level on a table or a desk. . Use the proper sized cuff. It should fit smoothly and snugly around your bare upper arm. There should be enough room to slip a fingertip under the cuff. The bottom edge of the cuff should be 1 inch above the crease of the elbow. . Ideally, take 3 measurements at one sitting and record the average.

## 2016-09-11 ENCOUNTER — Other Ambulatory Visit: Payer: Self-pay

## 2016-09-11 MED ORDER — PRAVASTATIN SODIUM 10 MG PO TABS: 10.0000 mg | ORAL_TABLET | Freq: Every day | ORAL | 5 refills | Status: DC

## 2016-10-12 ENCOUNTER — Ambulatory Visit (HOSPITAL_BASED_OUTPATIENT_CLINIC_OR_DEPARTMENT_OTHER): Payer: Commercial Managed Care - HMO | Admitting: Adult Health

## 2016-10-12 ENCOUNTER — Encounter: Payer: Self-pay | Admitting: Adult Health

## 2016-10-12 VITALS — BP 142/84 | HR 78 | Temp 97.8°F | Resp 18 | Ht 63.0 in | Wt 203.1 lb

## 2016-10-12 DIAGNOSIS — Z9189 Other specified personal risk factors, not elsewhere classified: Secondary | ICD-10-CM

## 2016-10-12 DIAGNOSIS — Z170 Estrogen receptor positive status [ER+]: Secondary | ICD-10-CM | POA: Diagnosis not present

## 2016-10-12 DIAGNOSIS — C50412 Malignant neoplasm of upper-outer quadrant of left female breast: Secondary | ICD-10-CM

## 2016-10-12 NOTE — Progress Notes (Signed)
CLINIC:  Survivorship   REASON FOR VISIT:  Routine follow-up post-treatment for a recent history of breast cancer.  BRIEF ONCOLOGIC HISTORY:    Breast cancer of upper-outer quadrant of left female breast ([place])   03/31/2016 Initial Diagnosis    Screening detected left breast mass, 2 nodules, 1.9 x 1.6 x 0.8 cm= fat necrosis; 8 x 7 x 7 mm= grade 2 IDC ER 100%, PR 100%, HER-2 negative ratio 1.39, Ki-67 20%      04/17/2016 Surgery    Left lumpectomy ([name]): IDC grade 2, 1.3 cm, with DCIS, margins negative, 0/4 lymph nodes negative, T1 cN0 stage IA pathologic stage, ER 100%, PR 100%, HER-2 negative ratio 1.39, Ki-67 20% Oncotype DX score 20, 13% ROR, intermediate risk      04/17/2016 Oncotype testing    Recurrence score: 20; ROR 15% (intermediate risk)       05/27/2016 - 07/13/2016 Radiation Therapy    Adjuvant radiation therapy [facility]): Left breast treated with breath hold to 50.4 Gy in 28 fractions at 1.8 Gy/fraction.  Left breast boosted to 10 Gy in 5 fractions at 2 Gy/fraction      07/23/2016 -  Anti-estrogen oral therapy    Anastrozole 1 mg by mouth daily 5 years       INTERVAL HISTORY:  Michele Page presents to the [facility] today for our initial meeting to review her survivorship care plan detailing her treatment course for breast cancer, as well as monitoring long-term side effects of that treatment, education regarding health maintenance, screening, and overall wellness and health promotion.     Overall, Michele Page reports feeling quite well since completing her radiation therapy approximately 3 months ago.  She started the Anastrozole in 07/2016 and is tolerating it very well with largely no side effects. Denies hot flashes, arthralgias, or vaginal dryness.    She does feel like her left arm has been a bit swollen recently. She tells me she was told that she has a "tear in my arm and they told me I'm going to have to have it fixed sometime in the future."  She denies any  shoulder or arm pain, only has noted swelling.    She struggles with memory loss since her stroke a few years ago.  She thinks she had a DEXA scan earlier this year or last year, but she isn't sure.  She does not remember who ordered it or where she had the test done.      REVIEW OF SYSTEMS:  Review of Systems  Constitutional: Negative.   HENT:  Negative.   Eyes: Negative.   Respiratory: Negative.   Cardiovascular: Negative.   Gastrointestinal: Negative.   Endocrine: Negative.  Negative for hot flashes.  Genitourinary: Negative.  Negative for vaginal bleeding.   Musculoskeletal: Negative.  Negative for arthralgias.  Skin: Negative.   Neurological: Negative.   Hematological: Negative.   Psychiatric/Behavioral: The patient is nervous/anxious (fear of cancer recurrence ).   Breast: Denies any new nodularity, masses, tenderness, nipple changes, or nipple discharge.    A 14-point review of systems was completed and was negative, except as noted above.   ONCOLOGY TREATMENT TEAM:  1. Surgeon:  Dr. Brantley Stage at [facility] 2. Medical Oncologist: Dr. Lindi Adie 3. Radiation Oncologist: Dr. Lisbeth Renshaw    PAST MEDICAL/SURGICAL HISTORY:  Past Medical History:  Diagnosis Date  . Arthritis    lt knee  . Breast cancer ([place])   . Breast cancer of upper-outer quadrant of left female breast (  [place]) 04/02/2016  . Colon polyps   . COPD (chronic obstructive pulmonary disease) ([place])   . Hyperlipidemia   . Hypertension   . Multiple sclerosis ([place])   . Stroke ([place])    2013, mild cognitive deficits  . Vision abnormalities    Past Surgical History:  Procedure Laterality Date  . HYSTEROSCOPY W/D&C N/A 12/19/2015   Procedure: DILATATION AND CURETTAGE /HYSTEROSCOPY;  Surgeon: Linda Hedges, DO;  Location: [facility];  Service: Gynecology;  Laterality: N/A;  . RADIOACTIVE SEED GUIDED MASTECTOMY WITH AXILLARY SENTINEL LYMPH NODE BIOPSY Left 04/17/2016   Procedure: RADIOACTIVE SEED GUIDED PARTIAL  MASTECTOMY WITH AXILLARY SENTINEL LYMPH NODE BIOPSY;  Surgeon: Erroll Luna, MD;  Location: [facility];  Service: General;  Laterality: Left;  RADIOACTIVE SEED GUIDED PARTIAL MASTECTOMY WITH AXILLARY SENTINEL LYMPH NODE BIOPSY   . TUBAL LIGATION    . VAGINAL DELIVERY     x3     ALLERGIES:  No Known Allergies   CURRENT MEDICATIONS:  Outpatient Encounter Prescriptions as of 10/12/2016  Medication Sig Note  . anastrozole (ARIMIDEX) 1 MG tablet Take 1 tablet (1 mg total) by mouth daily.   Marland Kitchen aspirin EC 81 MG tablet Take 1 tablet (81 mg total) by mouth daily.   Marland Kitchen labetalol (NORMODYNE) 300 MG tablet Take 2 tablets (600 mg total) by mouth 3 (three) times daily.   Marland Kitchen losartan (COZAAR) 100 MG tablet TAKE ONE TABLET BY MOUTH ONCE DAILY FOR BLOOD PRESSURE   . NIFEdipine (PROCARDIA-XL/ADALAT CC) 60 MG 24 hr tablet  10/12/2016: Received from: External Pharmacy  . pravastatin (PRAVACHOL) 10 MG tablet Take 1 tablet (10 mg total) by mouth at bedtime.    No facility-administered encounter medications on file as of 10/12/2016.      ONCOLOGIC FAMILY HISTORY:  Family History  Problem Relation Age of Onset  . Stroke Sister   . Hypertension Mother   . Stroke Mother   . Alcohol abuse Brother   . HIV/AIDS Brother      GENETIC COUNSELING/TESTING: None.   SOCIAL HISTORY:  Michele Page is married and lives with her husband in [place], [place].  She is originally from [place] where she lived for most of her life.  She has 3 children, 2 sons and 1 daughter.  She has 9 grandchildren.  She currently is unemployed and looking for part-time work.  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  She has been in [place] for about 2 years.  She denies any current tobacco, alcohol, or illicit drug use.     PHYSICAL EXAMINATION:  Vital Signs:   Vitals:   10/12/16 1016  BP: (!) 142/84  Pulse: 78  Resp: 18  Temp: 97.8 F (36.6 C)   Filed Weights   10/12/16  1016  Weight: 203 lb 1.6 oz (92.1 kg)   General: Well-nourished, well-appearing female in no acute distress.  She is unaccompanied today.   HEENT: Head is normocephalic.  Pupils equal and reactive to light. Conjunctivae clear without exudate.  Sclerae anicteric. Oral mucosa is pink, moist.  Oropharynx is pink without lesions or erythema.  Lymph: No cervical, supraclavicular, or infraclavicular lymphadenopathy noted on palpation.  Cardiovascular: Regular rate and rhythm.Marland Kitchen Respiratory: Clear to auscultation bilaterally. Chest expansion symmetric; breathing non-labored.  GI: Abdomen soft and round; non-tender, non-distended. Bowel sounds normoactive.  GU: Deferred.  Neuro/MSK: No focal deficits. Steady gait. Full ROM noted to left shoulder.  Psych: Mood and affect normal and appropriate  for situation.  Extremities: Possible very subtle lymphedema to left forearm.  Otherwise, no edema to extremities noted.  Skin: Warm and dry.  LABORATORY DATA:  None for this visit.  DIAGNOSTIC IMAGING:  None for this visit.      ASSESSMENT AND PLAN:  Ms.. Page is a pleasant 60 y.o. female with Stage IA left breast invasive ductal carcinoma, ER+/PR+/HER2-, diagnosed in 03/2016; treated with lumpectomy, adjuvant radiation therapy, and anti-estrogen therapy with Anastrozole beginning in 07/2016.  She presents to the Survivorship Clinic for our initial meeting and routine follow-up post-completion of treatment for breast cancer.    1. Stage IA left breast cancer:  Michele Page is continuing to recover from definitive treatment for breast cancer. She will follow-up with her medical oncologist, Dr. Lindi Adie on 10/22/16 with history and physical exam per surveillance protocol.  She will continue her anti-estrogen therapy with anastrozole. Thus far, she is tolerating the medication well, with minimal side effects. She was instructed to make Dr. Lindi Adie or myself aware if she begins to experience any worsening side effects of  the medication and I could see her back in clinic to help manage those side effects, as needed. Common side effects of Anastrozole were again reviewed with her as well. Today, a comprehensive survivorship care plan and treatment summary was reviewed with the patient today detailing her breast cancer diagnosis, treatment course, potential late/long-term effects of treatment, appropriate follow-up care with recommendations for the future, and patient education resources.  A copy of this summary, along with a letter will be sent to the patient's primary care provider via mail/fax/In Basket message after today's visit.    2. At risk for lymphedema:  Michele Page did have left axillary sentinel lymph node biopsy along with left breast lumpectomy.  We discussed that she is at risk for lymphedema to her left arm in the future.  It is difficult to assess if she has lymphedema on physical exam, but there may be very subtle swelling noted to the left arm.  She agreed to allow me to place a referral to PT.  They can make an assessment for both the lymphedema, as well as for her possible history of left arm injury ("tear").  She had full ROM to her left shoulder on exam today.    3. Bone health:  Given Ms. 74 age, history of breast cancer, and her current treatment regimen including anti-estrogen therapy with Anastrozole, she is at risk for bone demineralization.  She tells me she thinks she had a DEXA scan earlier this year or last year, but she cannot remember who ordered the scan or where she had it done.  Since her stroke several years ago, she has memory difficulties.  We discussed the importance of having a baseline DEXA scan when starting aromatase inhibitors.  I encouraged her to check her records at home and call us if she was able to find either the results of the DEXA scan or where she had the test done where we could call to get the results. If the DEXA scan needs to be ordered, I will defer to Dr. Geralyn Flash  recommendations for how best to proceed.  In the meantime, she was encouraged to increase her consumption of foods rich in calcium, as well as increase her weight-bearing activities.  She was given education on specific activities to promote bone health.  4. Cancer screening:  Due to Michele Page's history and her age, she should receive screening for skin cancers, colon cancer,  and gynecologic cancers.  The information and recommendations are listed on the patient's comprehensive care plan/treatment summary and were reviewed in detail with the patient.    5. Health maintenance and wellness promotion: Michele Page was encouraged to consume 5-7 servings of fruits and vegetables per day. We reviewed the "Nutrition Rainbow" handout, as well as the handout "Take Control of Your Health and Reduce Your Cancer Risk" from the [facility].  She was also encouraged to engage in moderate to vigorous exercise for 30 minutes per day most days of the week. We discussed the [employer] fitness program, which is designed for cancer survivors to help them become more physically fit after cancer treatments.  She was instructed to limit her alcohol consumption and continue to abstain from tobacco use.   6. Support services/counseling: It is not uncommon for this period of the patient's cancer care trajectory to be one of many emotions and stressors.  It is also extremely common for patients to experience fear of cancer recurrence and uncertainty regarding their future as cancer survivors.  We discussed an opportunity for her to participate in the next session of [facility] ("Finding Your New Normal") support group series designed for patients after they have completed treatment.   Michele Page was encouraged to take advantage of our many other support services programs, support groups, and/or counseling in coping with her new life as a cancer survivor after completing anti-cancer treatment.  She was offered support today through  active listening and expressive supportive counseling.  She was given information regarding our available services and encouraged to contact me with any questions or for help enrolling in any of our support group/programs.    Dispo:   -Referral to PT for left arm lymphedema.  -Return to cancer center to see Dr. Lindi Adie on 10/22/16.  -She is welcome to return back to the Survivorship Clinic at any time; no additional follow-up needed at this time.  -Consider referral back to survivorship as a long-term survivor for continued surveillance   A total of 55 minutes of face-to-face time was spent with this patient with greater than 50% of that time in counseling and care-coordination.   Mike Craze, NP Survivorship Program [facility] [phone]   Note: PRIMARY CARE PROVIDER Gildardo Cranker, [place] [phone]

## 2016-10-12 NOTE — Patient Instructions (Signed)
It was great to meet you today. Feel free to call me with any questions or concerns!  Gretchen Dawson, NP Survivorship Program [facility] [phone]  

## 2016-10-15 DIAGNOSIS — H524 Presbyopia: Secondary | ICD-10-CM | POA: Diagnosis not present

## 2016-10-16 ENCOUNTER — Ambulatory Visit: Payer: Commercial Managed Care - HMO | Attending: Adult Health | Admitting: Physical Therapy

## 2016-10-16 DIAGNOSIS — M6281 Muscle weakness (generalized): Secondary | ICD-10-CM | POA: Diagnosis not present

## 2016-10-16 NOTE — Therapy (Signed)
[place], [place], [address] Phone: [phone]   Fax:  [phone]  Physical Therapy Evaluation  Patient Details  Name: Michele Page MRN: [id number] Date of Birth: 1956-05-13 Referring Provider: Mike Craze, NP  Encounter Date: 10/16/2016      PT End of Session - 10/16/16 1251    Visit Number 1   Number of Visits 1   PT Start Time 0800   PT Stop Time 0846   PT Time Calculation (min) 46 min   Activity Tolerance Patient tolerated treatment well   Behavior During Therapy [facility] for tasks assessed/performed      Past Medical History:  Diagnosis Date  . Arthritis    lt knee  . Breast cancer ([place])   . Breast cancer of upper-outer quadrant of left female breast ([place]) 04/02/2016  . Colon polyps   . COPD (chronic obstructive pulmonary disease) ([place])   . Hyperlipidemia   . Hypertension   . Multiple sclerosis ([place])   . Stroke ([place])    2013, mild cognitive deficits  . Vision abnormalities     Past Surgical History:  Procedure Laterality Date  . HYSTEROSCOPY W/D&C N/A 12/19/2015   Procedure: DILATATION AND CURETTAGE /HYSTEROSCOPY;  Surgeon: Linda Hedges, DO;  Location: [facility];  Service: Gynecology;  Laterality: N/A;  . RADIOACTIVE SEED GUIDED MASTECTOMY WITH AXILLARY SENTINEL LYMPH NODE BIOPSY Left 04/17/2016   Procedure: RADIOACTIVE SEED GUIDED PARTIAL MASTECTOMY WITH AXILLARY SENTINEL LYMPH NODE BIOPSY;  Surgeon: Erroll Luna, MD;  Location: [facility];  Service: General;  Laterality: Left;  RADIOACTIVE SEED GUIDED PARTIAL MASTECTOMY WITH AXILLARY SENTINEL LYMPH NODE BIOPSY   . TUBAL LIGATION    . VAGINAL DELIVERY     x3    There were no vitals filed for this visit.       Subjective Assessment - 10/16/16 0806    Subjective Left arm is swollen, it's getting fatter.  Had torn something in left arm and a year ago she was to have surgery on it, but didn't because she was scared.  She  thinks it's the rotator cuff but is not sure.   Pertinent History Left breast cancer diagnosed in April this year.  She had lumpectomy in May with 2 or 4 lymph nodes removed, all negative. Had XRT that finished in August. On Arimidex. Had a stroke five years ago that affected her speech and hospitalized her for 2 months, but she has recovered well except for slight challenge with speaking.  HTN controlled with meds.  Says her hair is coming out.   Patient Stated Goals Figure out what's causing the swelling in the left arm.   Currently in Pain? No/denies            [facility] PT Assessment - 10/16/16 0001      Assessment   Medical Diagnosis left breast cancer s/p lumpectomy   Referring Provider Mike Craze, NP   Onset Date/Surgical Date 03/30/16  approx.   Hand Dominance Right   Prior Therapy none     Precautions   Precautions Other (comment)   Precaution Comments cancer precautions     Restrictions   Weight Bearing Restrictions No     Balance Screen   Has the patient fallen in the past 6 months No   Has the patient had a decrease in activity level because of a fear of falling?  No   Is the patient reluctant to leave their home because of a fear of  falling?  No     Home Ecologist residence   Living Arrangements Spouse/significant other   Type of [place] Two level     Prior Function   Level of Independence Independent   Vocation Unemployed  by choice, but says she needs to go back to work   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   Leisure goes to [employer] every other day:  walks on the treadmill 30 minutes, rides bike 30 minutes, leg strengthening machine (hip adductors), elliptical 30 minutes     Cognition   Overall Cognitive Status Within Functional Limits for tasks assessed  seems to have word finding problems   Memory --  difficulty recalling dates of procedures     Observation/Other Assessments   Other Surveys   --  lymph life impact scale score of 4 = 6% impairment     ROM / Strength   AROM / PROM / Strength AROM;Strength     AROM   Overall AROM Comments both shoulders [facility]     Strength   Overall Strength Comments Right shoulder grossly 4-/5; left shoulder flexion and abduction 5/5, rotators 4+/5           LYMPHEDEMA/ONCOLOGY QUESTIONNAIRE - 10/16/16 0819      Type   Cancer Type left breast     Surgeries   Lumpectomy Date 03/30/16  approx.   Number Lymph Nodes Removed 2  or 4     Treatment   Past Chemotherapy Treatment No   Past Radiation Treatment Yes   Date 06/30/16  approx.   Current Hormone Treatment Yes   Drug Name anastrazole     What other symptoms do you have   Are you having pitting edema No   [place] Sign No     Lymphedema Assessments   Lymphedema Assessments Upper extremities     Right Upper Extremity Lymphedema   15 cm Proximal to Olecranon Process 31.3 cm   10 cm Proximal to Olecranon Process 29.3 cm   Olecranon Process 26 cm   15 cm Proximal to Ulnar Styloid Process 27.1 cm   10 cm Proximal to Ulnar Styloid Process 23.7 cm   Just Proximal to Ulnar Styloid Process 16.5 cm   Across Hand at [employer] 21.5 cm   At [place] of 2nd Digit 6.3 cm     Left Upper Extremity Lymphedema   15 cm Proximal to Olecranon Process 31.5 cm   10 cm Proximal to Olecranon Process 29.3 cm   Olecranon Process 25.7 cm   15 cm Proximal to Ulnar Styloid Process 26.9 cm   10 cm Proximal to Ulnar Styloid Process 23.4 cm   Just Proximal to Ulnar Styloid Process 16.1 cm   Across Hand at [employer] 21.8 cm   At [place] of 2nd Digit 6.5 cm                [facility] Adult PT Treatment/Exercise - 10/16/16 0001      Self-Care   Self-Care Other Self-Care Comments   Other Self-Care Comments  patient education--see education section                PT Education - 10/16/16 1251    Education provided Yes   Education Details lymphedema risk reduction education and  how/where to obtain a compression sleeve; also about her low lymphedema risk   Person(s) Educated Patient   Methods Explanation;Handout   Comprehension Verbalized understanding  Long Term Clinic Goals - 10/16/16 1258      CC Long Term Goal  #1   Title Patient will be knowledgeable about lymphedema risk and risk reduction, including where and how to obtain a compression sleeve   Time 1   Period Days   Status Achieved            Plan - 10/16/16 1252    Clinical Impression Statement Patient who notes swelling in left UE but is unsure whether this stems from an old possible rotator cuff injury or is lymphedema comes in today seeking help to figure that out.  Her circumference measurements show little difference left to right UE, and right is generally very slightly (0.2-0.3 cm.) larger than left except at upper arm, where left is slightly larger at most superior level measured.  She has no pitting, and edema is not apparent by this therapist's observation.  She was given information today about the potential benefit of a getting a compression sleeve and when to wear that.  She expressed a plan to check back with the orthopedist about possibly going through with rotator cuff repair surgery (which she had been afraid of doing before).  Her shoulder AROM is WFL bilat. and shoulder strength is slightly decreased, especially on right from a stroke she had about five years ago.   Rehab Potential Good   Clinical Impairments Affecting Rehab Potential none   PT Frequency One time visit   PT Treatment/Interventions ADLs/Self Care Home Management;Patient/family education   PT Next Visit Plan No further therapy is recommended at this time.    Recommended Other Services For patient to obtain a compression sleeve for edema prophylaxis.   Consulted and Agree with Plan of Care Patient      Patient will benefit from skilled therapeutic intervention in order to improve the following  deficits and impairments:  Decreased strength, Decreased knowledge of precautions, Decreased knowledge of use of DME  Visit Diagnosis: Muscle weakness (generalized) - Plan: PT plan of care cert/re-cert      G-Codes - [id number] 1300    Functional Assessment Tool Used lymphedema life impact scale   Functional Limitation Self care   Self Care Current Status [id number]) At least 1 percent but less than 20 percent impaired, limited or restricted   Self Care Goal Status [id number]) At least 1 percent but less than 20 percent impaired, limited or restricted   Self Care Discharge Status [phone]) At least 1 percent but less than 20 percent impaired, limited or restricted       Problem List Patient Active Problem List   Diagnosis Date Noted  . Breast cancer of upper-outer quadrant of left female breast ([place]) 04/02/2016  . Essential hypertension 01/10/2016  . History of stroke with residual deficit 01/10/2016  . Gastroesophageal reflux disease without esophagitis 01/10/2016  . Hyperlipidemia 01/10/2016  . CKD (chronic kidney disease) 01/10/2016  . Multiple sclerosis ([place]) 03/20/2015  . Accelerated hypertension 03/20/2015  . CVA (cerebral infarction) 03/20/2015  . Hemiplegia following CVA (cerebrovascular accident) ([place]) 03/20/2015  . Cognitive disorder 03/20/2015  . Nocturia 03/20/2015  . Snoring 03/20/2015  . Aphasia S/P CVA 03/20/2015  . 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

## 2016-10-20 ENCOUNTER — Telehealth: Payer: Self-pay | Admitting: *Deleted

## 2016-10-20 DIAGNOSIS — Z1283 Encounter for screening for malignant neoplasm of skin: Secondary | ICD-10-CM

## 2016-10-20 NOTE — Telephone Encounter (Signed)
Dr. Allyn Kenner, Dermatologist office called and stated that the patient has an appointment and needs a Michele Page Referral sent to them. Is this ok to place? Please Advise.

## 2016-10-20 NOTE — Telephone Encounter (Signed)
Ok to refer to dermatology

## 2016-10-21 ENCOUNTER — Other Ambulatory Visit: Payer: Self-pay | Admitting: Cardiovascular Disease

## 2016-10-21 DIAGNOSIS — I10 Essential (primary) hypertension: Secondary | ICD-10-CM

## 2016-10-21 NOTE — Telephone Encounter (Signed)
Referral placed.

## 2016-10-21 NOTE — Telephone Encounter (Signed)
Rx(s) sent to pharmacy electronically.  

## 2016-10-21 NOTE — Assessment & Plan Note (Signed)
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, 1.3 cm, with DCIS, margins negative, 0/4 lymph nodes negative, T1 cN0 stage IA pathologic stage, ER 100%, PR 100%, HER-2 negative ratio 1.39, Ki-67 20% (Originally 2 nodules were detected on screening mammogram 1.9 cm= fat necrosis and 8 mm IDC) Oncotype DX score 20, 13% risk of recurrence, intermediate risk Adjuvant radiation therapy started 07/20/2017completed 07/13/2016  Recommendation: Adjuvant antiestrogen therapy with anastrozole 1 mg daily 5 years started 07/23/16 Anastrozole toxicities:  RTC 6 months

## 2016-10-22 ENCOUNTER — Ambulatory Visit (HOSPITAL_BASED_OUTPATIENT_CLINIC_OR_DEPARTMENT_OTHER): Payer: Commercial Managed Care - HMO | Admitting: Hematology and Oncology

## 2016-10-22 ENCOUNTER — Encounter: Payer: Self-pay | Admitting: Hematology and Oncology

## 2016-10-22 DIAGNOSIS — Z170 Estrogen receptor positive status [ER+]: Secondary | ICD-10-CM | POA: Diagnosis not present

## 2016-10-22 DIAGNOSIS — C50412 Malignant neoplasm of upper-outer quadrant of left female breast: Secondary | ICD-10-CM | POA: Diagnosis not present

## 2016-10-22 DIAGNOSIS — I10 Essential (primary) hypertension: Secondary | ICD-10-CM

## 2016-10-22 NOTE — Progress Notes (Signed)
Patient Care Team: Gildardo Cranker, DO as PCP - General (Internal Medicine) Nicholas Lose, MD as Consulting Physician (Oncology) Erroll Luna, MD as Consulting Physician (General Surgery) Thea Silversmith, MD as Consulting Physician (Radiation Oncology)  DIAGNOSIS:  Encounter Diagnosis  Name Primary?  . Malignant neoplasm of upper-outer quadrant of left breast in female, estrogen receptor positive ([place])     SUMMARY OF ONCOLOGIC HISTORY:   Breast cancer of upper-outer quadrant of left female breast ([place])   03/31/2016 Initial Diagnosis    Screening detected left breast mass, 2 nodules, 1.9 x 1.6 x 0.8 cm= fat necrosis; 8 x 7 x 7 mm= grade 2 IDC ER 100%, PR 100%, HER-2 negative ratio 1.39, Ki-67 20%      04/17/2016 Surgery    Left lumpectomy ([name]): IDC grade 2, 1.3 cm, with DCIS, margins negative, 0/4 lymph nodes negative, T1 cN0 stage IA pathologic stage, ER 100%, PR 100%, HER-2 negative ratio 1.39, Ki-67 20% Oncotype DX score 20, 13% ROR, intermediate risk      04/17/2016 Oncotype testing    Recurrence score: 20; ROR 15% (intermediate risk)       05/27/2016 - 07/13/2016 Radiation Therapy    Adjuvant radiation therapy [facility]): Left breast treated with breath hold to 50.4 Gy in 28 fractions at 1.8 Gy/fraction.  Left breast boosted to 10 Gy in 5 fractions at 2 Gy/fraction      07/23/2016 -  Anti-estrogen oral therapy    Anastrozole 1 mg by mouth daily 5 years       CHIEF COMPLIANT: Follow-up on anastrozole therapy  INTERVAL HISTORY: Michele Page is a 60 year old with above-mentioned history of the left breast cancer to with lumpectomy followed by adjuvant radiation. She is currently on adjuvant antiestrogen therapy with anastrozole. The started 07/23/2016. She is here for toxicity evaluation. It appears that she is tolerating the anastrozole fairly well  REVIEW OF SYSTEMS:   Constitutional: Denies fevers, chills or abnormal weight loss Eyes: Denies blurriness of vision Ears, nose,  mouth, throat, and face: Denies mucositis or sore throat Respiratory: Denies cough, dyspnea or wheezes Cardiovascular: Denies palpitation, chest discomfort Gastrointestinal:  Denies nausea, heartburn or change in bowel habits Skin: Denies abnormal skin rashes Lymphatics: Denies new lymphadenopathy or easy bruising Neurological:Denies numbness, tingling or new weaknesses Behavioral/Psych: Mood is stable, no new changes  Extremities: No lower extremity edema Breast:  denies any pain or lumps or nodules in either breasts All other systems were reviewed with the patient and are negative.  I have reviewed the past medical history, past surgical history, social history and family history with the patient and they are unchanged from previous note.  ALLERGIES:  has No Known Allergies.  MEDICATIONS:  Current Outpatient Prescriptions  Medication Sig Dispense Refill  . anastrozole (ARIMIDEX) 1 MG tablet Take 1 tablet (1 mg total) by mouth daily. 90 tablet 3  . aspirin EC 81 MG tablet Take 1 tablet (81 mg total) by mouth daily. 30 tablet 6  . labetalol (NORMODYNE) 300 MG tablet TAKE TWO TABLETS BY MOUTH THREE TIMES DAILY 180 tablet 4  . losartan (COZAAR) 100 MG tablet TAKE ONE TABLET BY MOUTH ONCE DAILY FOR BLOOD PRESSURE 90 tablet 0  . NIFEdipine (PROCARDIA-XL/ADALAT CC) 60 MG 24 hr tablet     . pravastatin (PRAVACHOL) 10 MG tablet Take 1 tablet (10 mg total) by mouth at bedtime. 30 tablet 5   No current facility-administered medications for this visit.     PHYSICAL EXAMINATION: ECOG PERFORMANCE STATUS: 1 -  Symptomatic but completely ambulatory  Vitals:   10/22/16 1049  BP: (!) 152/86  Pulse: 84  Resp: 17  Temp: 98.5 F (36.9 C)   Filed Weights   10/22/16 1049  Weight: 203 lb 6.4 oz (92.3 kg)    GENERAL:alert, no distress and comfortable SKIN: skin color, texture, turgor are normal, no rashes or significant lesions EYES: normal, Conjunctiva are pink and non-injected, sclera  clear OROPHARYNX:no exudate, no erythema and lips, buccal mucosa, and tongue normal  NECK: supple, thyroid normal size, non-tender, without nodularity LYMPH:  no palpable lymphadenopathy in the cervical, axillary or inguinal LUNGS: clear to auscultation and percussion with normal breathing effort HEART: regular rate & rhythm and no murmurs and no lower extremity edema ABDOMEN:abdomen soft, non-tender and normal bowel sounds MUSCULOSKELETAL:no cyanosis of digits and no clubbing  NEURO: alert & oriented x 3 with fluent speech, no focal motor/sensory deficits EXTREMITIES: No lower extremity edema  LABORATORY DATA:  I have reviewed the data as listed   Chemistry      Component Value Date/Time   NA 138 07/21/2016 1006   NA 141 04/08/2016 0904   K 3.5 07/21/2016 1006   K 4.7 04/08/2016 0904   CL 101 07/21/2016 1006   CO2 26 07/21/2016 1006   CO2 26 04/08/2016 0904   BUN 19 07/21/2016 1006   BUN 16.3 04/08/2016 0904   CREATININE 1.50 (H) 07/21/2016 1006   CREATININE 1.5 (H) 04/08/2016 0904      Component Value Date/Time   CALCIUM 9.8 07/21/2016 1006   CALCIUM 10.0 04/08/2016 0904   ALKPHOS 90 04/08/2016 0904   AST 25 04/08/2016 0904   ALT 23 04/08/2016 0904   BILITOT 0.58 04/08/2016 0904       Lab Results  Component Value Date   WBC 4.9 04/08/2016   HGB 12.6 04/08/2016   HCT 38.0 04/08/2016   MCV 93.6 04/08/2016   PLT 271 04/08/2016   NEUTROABS 3.1 04/08/2016    ASSESSMENT & PLAN:  Breast cancer of upper-outer quadrant of left female breast ([place]) 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, 1.3 cm, with DCIS, margins negative, 0/4 lymph nodes negative, T1 cN0 stage IA pathologic stage, ER 100%, PR 100%, HER-2 negative ratio 1.39, Ki-67 20% (Originally 2 nodules were detected on screening mammogram 1.9 cm= fat necrosis and 8 mm IDC) Oncotype DX score 20, 13% risk of recurrence, intermediate risk Adjuvant radiation therapy started 07/20/2017completed  07/13/2016  Recommendation: Adjuvant antiestrogen therapy with anastrozole 1 mg daily 5 years started 07/23/16 Anastrozole toxicities: Denies any side effects. She denies any hot flashes or myalgias.  Hypertension: Patient's blood pressures been running high in spite of 3 blood pressure medications. I instructed her to discuss this with her primary care physician.  RTC 6 months  No orders of the defined types were placed in this encounter.  The patient has a good understanding of the overall plan. she agrees with it. she will call with any problems that may develop before the next visit here.   Rulon Eisenmenger, MD 10/22/16

## 2016-10-23 DIAGNOSIS — L650 Telogen effluvium: Secondary | ICD-10-CM | POA: Diagnosis not present

## 2016-10-26 ENCOUNTER — Other Ambulatory Visit: Payer: Self-pay | Admitting: Internal Medicine

## 2016-11-13 ENCOUNTER — Ambulatory Visit: Payer: Commercial Managed Care - HMO | Admitting: Internal Medicine

## 2016-11-13 ENCOUNTER — Ambulatory Visit: Payer: Commercial Managed Care - HMO

## 2016-11-19 ENCOUNTER — Other Ambulatory Visit: Payer: Self-pay | Admitting: Cardiovascular Disease

## 2016-12-10 DIAGNOSIS — M25562 Pain in left knee: Secondary | ICD-10-CM | POA: Diagnosis not present

## 2016-12-11 ENCOUNTER — Ambulatory Visit (INDEPENDENT_AMBULATORY_CARE_PROVIDER_SITE_OTHER): Payer: Commercial Managed Care - HMO | Admitting: Internal Medicine

## 2016-12-11 ENCOUNTER — Encounter: Payer: Self-pay | Admitting: Internal Medicine

## 2016-12-11 ENCOUNTER — Ambulatory Visit (INDEPENDENT_AMBULATORY_CARE_PROVIDER_SITE_OTHER): Payer: Commercial Managed Care - HMO

## 2016-12-11 VITALS — BP 152/84 | HR 80 | Temp 98.3°F | Ht 63.0 in | Wt 203.4 lb

## 2016-12-11 DIAGNOSIS — E782 Mixed hyperlipidemia: Secondary | ICD-10-CM | POA: Diagnosis not present

## 2016-12-11 DIAGNOSIS — K219 Gastro-esophageal reflux disease without esophagitis: Secondary | ICD-10-CM | POA: Diagnosis not present

## 2016-12-11 DIAGNOSIS — Z170 Estrogen receptor positive status [ER+]: Secondary | ICD-10-CM

## 2016-12-11 DIAGNOSIS — Z0000 Encounter for general adult medical examination without abnormal findings: Secondary | ICD-10-CM

## 2016-12-11 DIAGNOSIS — C50412 Malignant neoplasm of upper-outer quadrant of left female breast: Secondary | ICD-10-CM

## 2016-12-11 DIAGNOSIS — I10 Essential (primary) hypertension: Secondary | ICD-10-CM

## 2016-12-11 DIAGNOSIS — G4700 Insomnia, unspecified: Secondary | ICD-10-CM

## 2016-12-11 DIAGNOSIS — I6930 Unspecified sequelae of cerebral infarction: Secondary | ICD-10-CM | POA: Diagnosis not present

## 2016-12-11 MED ORDER — ZOLPIDEM TARTRATE 5 MG PO TABS: 5.0000 mg | ORAL_TABLET | Freq: Every evening | ORAL | 3 refills | Status: DC | PRN

## 2016-12-11 NOTE — Progress Notes (Signed)
Quick Notes   Health Maintenance:  Pt refused Zostavax and TDAP    Abnormal Screen: MMSE-26/30 Passed Clock test    Patient Concerns:  Pt states she has "spots" on her legs that she has had for several months. States she believes they are spots that she has hit and it bruised but they are not healing. Denies pain with them.   Nurse Concerns:  None

## 2016-12-11 NOTE — Patient Instructions (Addendum)
Michele Page , Thank you for taking time to come for your Medicare Wellness Visit. I appreciate your ongoing commitment to your health goals. Please review the following plan we discussed and let me know if I can assist you in the future.   These are the goals we discussed: Goals    . Increase water intake          Starting 12/11/16, I will attempt to increase my water intake from 2 glasses to 5 glasses per day.        This is a list of the screening recommended for you and due dates:  Health Maintenance  Topic Date Due  .  Hepatitis C: One time screening is recommended by Center for Disease Control  (CDC) for  adults born from [birth year] through 1966.   01-01-1956  . HIV Screening  08/03/1970  . Pap Smear  08/03/1976  . Shingles Vaccine  11/17/2019*  . Tetanus Vaccine  11/17/2019*  . Mammogram  03/31/2018  . Colon Cancer Screening  07/07/2018  . Flu Shot  Completed  *Topic was postponed. The date shown is not the original due date.  Preventive Care for Adults  A healthy lifestyle and preventive care can promote health and wellness. Preventive health guidelines for adults include the following key practices.  . A routine yearly physical is a good way to check with your health care provider about your health and preventive screening. It is a chance to share any concerns and updates on your health and to receive a thorough exam.  . Visit your dentist for a routine exam and preventive care every 6 months. Brush your teeth twice a day and floss once a day. Good oral hygiene prevents tooth decay and gum disease.  . The frequency of eye exams is based on your age, health, family medical history, use  of contact lenses, and other factors. Follow your health care provider's ecommendations for frequency of eye exams.  . Eat a healthy diet. Foods like vegetables, fruits, whole grains, low-fat dairy products, and lean protein foods contain the nutrients you need without too many calories. Decrease your  intake of foods high in solid fats, added sugars, and salt. Eat the right amount of calories for you. Get information about a proper diet from your health care provider, if necessary.  . Regular physical exercise is one of the most important things you can do for your health. Most adults should get at least 150 minutes of moderate-intensity exercise (any activity that increases your heart rate and causes you to sweat) each week. In addition, most adults need muscle-strengthening exercises on 2 or more days a week.  Silver Sneakers may be a benefit available to you. To determine eligibility, you may visit the website: www.silversneakers.com or contact program at [phone] Mon-Fri between 8AM-8PM.   . Maintain a healthy weight. The body mass index (BMI) is a screening tool to identify possible weight problems. It provides an estimate of body fat based on height and weight. Your health care provider can find your BMI and can help you achieve or maintain a healthy weight.   For adults 20 years and older: ? A BMI below 18.5 is considered underweight. ? A BMI of 18.5 to 24.9 is normal. ? A BMI of 25 to 29.9 is considered overweight. ? A BMI of 30 and above is considered obese.   . Maintain normal blood lipids and cholesterol levels by exercising and minimizing your intake of saturated fat. Eat a balanced  diet with plenty of fruit and vegetables. Blood tests for lipids and cholesterol should begin at age 68 and be repeated every 5 years. If your lipid or cholesterol levels are high, you are over 50, or you are at high risk for heart disease, you may need your cholesterol levels checked more frequently. Ongoing high lipid and cholesterol levels should be treated with medicines if diet and exercise are not working.  . If you smoke, find out from your health care provider how to quit. If you do not use tobacco, please do not start.  . If you choose to drink alcohol, please do not consume more than 2  drinks per day. One drink is considered to be 12 ounces (355 mL) of beer, 5 ounces (148 mL) of wine, or 1.5 ounces (44 mL) of liquor.  . If you are 84-11 years old, ask your health care provider if you should take aspirin to prevent strokes.  . Use sunscreen. Apply sunscreen liberally and repeatedly throughout the day. You should seek shade when your shadow is shorter than you. Protect yourself by wearing long sleeves, pants, a wide-brimmed hat, and sunglasses year round, whenever you are outdoors.  . Once a month, do a whole body skin exam, using a mirror to look at the skin on your back. Tell your health care provider of new moles, moles that have irregular borders, moles that are larger than a pencil eraser, or moles that have changed in shape or color.

## 2016-12-11 NOTE — Patient Instructions (Addendum)
Recommend follow up with Cardiology regarding blood pressure management  Take zolpidem as needed to help sleep  Follow up with specialists as scheduled  Please schedule fasting labs for next week  Follow up in 4 mos for routine visit.

## 2016-12-11 NOTE — Progress Notes (Signed)
Patient ID: Michele Page, female   DOB: May 06, 1956, 61 y.o.   MRN: [id number]    Location:  [facility] Place of Service: OFFICE  Chief Complaint  Patient presents with  . Medical Management of Chronic Issues    3 month routine visit  . MMSE    26/30 passed clock drawing  . Medication Management    requesting something to help with sleep    HPI:  61 yo female seen today for f/u. She is c/a BP. Within 30 minutes of taking labetalol, her BP is elevated to 180/140. No associated HA, dizziness, CP, SOB, palpitations. She has not seen cardio since Oct 2017. She was told to f/u prn.  She saw ortho yesterday and was rx meloxicam.   She c/o insomnia - has tried [place] in the past and would like a new rx.  She is c/a scars on her leg that will not go away. She takes ASA and arimidex. She has bruising that was caused by trauma  Hyperlipidemia - stable on pravastatin; LDL 110  Past Medical History:  Diagnosis Date  . Arthritis    lt knee  . Breast cancer ([name])   . Breast cancer of 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) 04/02/2016  . Colon polyps   . COPD (chronic obstructive pulmonary disease) ([name])   . Hyperlipidemia   . Hypertension   . Multiple sclerosis ([name])   . Stroke ([name])    2013, mild cognitive deficits  . Vision abnormalities     Past Surgical History:  Procedure Laterality Date  . HYSTEROSCOPY W/D&C N/A 12/19/2015   Procedure: DILATATION AND CURETTAGE /HYSTEROSCOPY;  Surgeon: Mitchel Honour, DO;  Location: [facility];  Service: Gynecology;  Laterality: N/A;  . RADIOACTIVE SEED GUIDED MASTECTOMY WITH AXILLARY SENTINEL LYMPH NODE BIOPSY Left 04/17/2016   Procedure: RADIOACTIVE SEED GUIDED PARTIAL MASTECTOMY WITH AXILLARY SENTINEL LYMPH NODE BIOPSY;  Surgeon: Harriette Bouillon, MD;  Location: [facility];  Service: General;  Laterality: Left;  RADIOACTIVE SEED GUIDED PARTIAL MASTECTOMY WITH AXILLARY SENTINEL LYMPH NODE BIOPSY   . TUBAL LIGATION    . VAGINAL DELIVERY     x3    Patient Care Team: Kirt Boys, DO as PCP - General (Internal Medicine) Serena Croissant, MD as Consulting Physician (Oncology) Harriette Bouillon, MD as Consulting Physician (General Surgery) Lurline Hare, MD as Consulting Physician (Radiation Oncology)  Social History   Social History  . Marital status: Married    Spouse name: N/A  . Number of children: 3  . Years of education: N/A   Occupational History  . retired    Social History Main Topics  . Smoking status: Former Smoker    Packs/day: 0.50    Years: 25.00    Types: Cigarettes    Quit date: 05/21/2002  . Smokeless tobacco: Never Used  . Alcohol use 0.0 oz/week     Comment: occas  . Drug use: No  . Sexual activity: No   Other Topics Concern  . Not on file   Social History Narrative   Diet- N/A   Caffeine- Yes   Married- Yes   House- 2 story with 2 people   Pets- No   Current/past profession- Systems developer   Exercise- Yes   Living will-No   DNR-N/A   POA/HPOA-No           reports that she quit smoking about 14 years ago. Her smoking use included Cigarettes. She has a 12.50 pack-year smoking history. She has never used  smokeless tobacco. She reports that she drinks alcohol. She reports that she does not use drugs.  Family History  Problem Relation Age of Onset  . Stroke Sister   . Hypertension Mother   . Stroke Mother   . Alcohol abuse Brother   . HIV/AIDS Brother    Family Status  Relation Status  . Sister Deceased  . Father Deceased at age 62  . Mother Deceased at age 65  . Brother Deceased  . Brother Deceased  . Brother Deceased     No Known Allergies  Medications: Patient's Medications  New Prescriptions   No medications on file  Previous Medications   ANASTROZOLE (ARIMIDEX) 1 MG TABLET    Take 1 tablet (1 mg total) by mouth daily.   ASPIRIN EC 81 MG TABLET    Take 1 tablet (81 mg total) by mouth daily.   LABETALOL (NORMODYNE) 300 MG TABLET    TAKE TWO TABLETS BY MOUTH  THREE TIMES DAILY   LOSARTAN (COZAAR) 100 MG TABLET    TAKE ONE TABLET BY MOUTH ONCE DAILY FOR BLOOD PRESSURE   MELOXICAM (MOBIC) 15 MG TABLET    Take 15 mg by mouth daily.   NIFEDIPINE (PROCARDIA-XL/ADALAT CC) 60 MG 24 HR TABLET    TAKE ONE TABLET BY MOUTH TWICE DAILY   PRAVASTATIN (PRAVACHOL) 10 MG TABLET    Take 1 tablet (10 mg total) by mouth at bedtime.  Modified Medications   No medications on file  Discontinued Medications   No medications on file    Review of Systems  Skin: Positive for color change.  Psychiatric/Behavioral: Positive for sleep disturbance.  All other systems reviewed and are negative.   Vitals:   12/11/16 1358  BP: (!) 152/84  Pulse: 80  Temp: 98.3 F (36.8 C)  TempSrc: Oral  SpO2: 99%  Weight: 203 lb 6.4 oz (92.3 kg)  Height: _0  (1.6 m)   Body mass index is 36.03 kg/m.  Physical Exam  Constitutional: She appears well-developed and well-nourished.  HENT:  Mouth/Throat: Oropharynx is clear and moist. No oropharyngeal exudate.  Eyes: Pupils are equal, round, and reactive to light. No scleral icterus.  Neck: Neck supple. Carotid bruit is not present. No tracheal deviation present. No thyromegaly present.  Cardiovascular: Normal rate, regular rhythm and intact distal pulses.  Exam reveals no gallop and no friction rub.   Murmur (1/6) heard. No LE edema b/l. no calf TTP. No LUE edema  Pulmonary/Chest: Effort normal and breath sounds normal. No stridor. No respiratory distress. She has no wheezes. She has no rales. Right breast exhibits no inverted nipple, no mass, no nipple discharge, no skin change and no tenderness. Left breast exhibits no inverted nipple, no mass, no nipple discharge, no skin change and no tenderness. Breasts are asymmetrical.  Abdominal: Soft. Bowel sounds are normal. She exhibits no distension and no mass. There is no hepatomegaly. There is no tenderness. There is no rebound and no guarding.  Musculoskeletal: She exhibits edema.    Left lateral leg swelling with yellowish contusion, NT; right neck muscle hypertrophy with ropy tissue texture changes  Lymphadenopathy:    She has no cervical adenopathy.  Neurological: She is alert.  Skin: Skin is warm and dry. No rash noted.  Psychiatric: She has a normal mood and affect. Her behavior is normal. Thought content normal. Her speech is slurred.     Labs reviewed: No visits with results within 3 Month(s) from this visit.  Latest known visit with results  is:  Telephone on 07/16/2016  Component Date Value Ref Range Status  . Sodium 07/21/2016 138  135 - 146 mmol/L Final  . Potassium 07/21/2016 3.5  3.5 - 5.3 mmol/L Final  . Chloride 07/21/2016 101  98 - 110 mmol/L Final  . CO2 07/21/2016 26  20 - 31 mmol/L Final  . Glucose, Bld 07/21/2016 93  65 - 99 mg/dL Final  . BUN 07/21/2016 19  7 - 25 mg/dL Final  . Creat 07/21/2016 1.50* 0.50 - 0.99 mg/dL Final   Comment:   For patients > or = 62 years of age: The upper reference limit for Creatinine is approximately 13% higher for people identified as African-American.     . Calcium 07/21/2016 9.8  8.6 - 10.4 mg/dL Final    No results found.   Assessment/Plan   ICD-9-CM ICD-10-CM   1. Essential hypertension 401.9 I10 CMP with eGFR   labile; failing to change as expected  2. Malignant neoplasm of upper-outer quadrant of left breast in female, estrogen receptor positive ([name]) 174.4 C50.412 CMP with eGFR   V86.0 Z17.0   3. History of stroke with residual deficit 438.9 I69.30 CMP with eGFR  4. Insomnia, unspecified type 780.52 G47.00 zolpidem (AMBIEN) 5 MG tablet  5. Gastroesophageal reflux disease without esophagitis 530.81 K21.9   6. Mixed hyperlipidemia 272.2 E78.2 Lipid Panel    Recommend follow up with Cardiology regarding blood pressure management  Take zolpidem as needed to help sleep  Follow up with specialists as scheduled  Please schedule fasting labs for next week  Follow up in 4 mos for routine  visit.  Kendyl Bissonnette S. Perlie Gold  [facility] [address] [place], [place] [address] [phone] Cell (Monday-Friday 8 AM - 5 PM) [phone] After 5 PM and follow prompts

## 2016-12-11 NOTE — Progress Notes (Signed)
Subjective:   Michele Page is a 61 y.o. female who presents for an Initial Medicare Annual Wellness Visit.  Review of Systems    Cardiac Risk Factors include: advanced age (>67men, >58 women);family history of premature cardiovascular disease;hypertension;obesity (BMI >30kg/m2);smoking/ tobacco exposure     Objective:    Today's Vitals   12/11/16 1319  BP: (!) 152/84  Pulse: 80  Temp: 98.3 F (36.8 C)  TempSrc: Oral  SpO2: 99%  Weight: 203 lb 6.4 oz (92.3 kg)  Height: 5\' 3"  (1.6 m)   Body mass index is 36.03 kg/m.   Current Medications (verified) Outpatient Encounter Prescriptions as of 12/11/2016  Medication Sig  . anastrozole (ARIMIDEX) 1 MG tablet Take 1 tablet (1 mg total) by mouth daily.  Marland Kitchen aspirin EC 81 MG tablet Take 1 tablet (81 mg total) by mouth daily.  Marland Kitchen labetalol (NORMODYNE) 300 MG tablet TAKE TWO TABLETS BY MOUTH THREE TIMES DAILY  . losartan (COZAAR) 100 MG tablet TAKE ONE TABLET BY MOUTH ONCE DAILY FOR BLOOD PRESSURE  . meloxicam (MOBIC) 15 MG tablet Take 15 mg by mouth daily.  . pravastatin (PRAVACHOL) 10 MG tablet Take 1 tablet (10 mg total) by mouth at bedtime.  Marland Kitchen NIFEdipine (PROCARDIA-XL/ADALAT CC) 60 MG 24 hr tablet TAKE ONE TABLET BY MOUTH TWICE DAILY (Patient taking differently: Take 2 tablets 3 times per day)  . [DISCONTINUED] NIFEdipine (PROCARDIA-XL/ADALAT CC) 60 MG 24 hr tablet    No facility-administered encounter medications on file as of 12/11/2016.     Allergies (verified) Patient has no known allergies.   History: Past Medical History:  Diagnosis Date  . Arthritis    lt knee  . Breast cancer ([place])   . Breast cancer of upper-outer quadrant of left female breast ([place]) 04/02/2016  . Colon polyps   . COPD (chronic obstructive pulmonary disease) ([place])   . Hyperlipidemia   . Hypertension   . Multiple sclerosis ([place])   . Stroke ([place])    2013, mild cognitive deficits  . Vision abnormalities    Past Surgical History:  Procedure  Laterality Date  . HYSTEROSCOPY W/D&C N/A 12/19/2015   Procedure: DILATATION AND CURETTAGE /HYSTEROSCOPY;  Surgeon: Linda Hedges, DO;  Location: [facility];  Service: Gynecology;  Laterality: N/A;  . RADIOACTIVE SEED GUIDED MASTECTOMY WITH AXILLARY SENTINEL LYMPH NODE BIOPSY Left 04/17/2016   Procedure: RADIOACTIVE SEED GUIDED PARTIAL MASTECTOMY WITH AXILLARY SENTINEL LYMPH NODE BIOPSY;  Surgeon: Erroll Luna, MD;  Location: [facility];  Service: General;  Laterality: Left;  RADIOACTIVE SEED GUIDED PARTIAL MASTECTOMY WITH AXILLARY SENTINEL LYMPH NODE BIOPSY   . TUBAL LIGATION    . VAGINAL DELIVERY     x3   Family History  Problem Relation Age of Onset  . Stroke Sister   . Hypertension Mother   . Stroke Mother   . Alcohol abuse Brother   . HIV/AIDS Brother    Social History   Occupational History  . retired    Social History Main Topics  . Smoking status: Former Smoker    Packs/day: 0.50    Years: 25.00    Types: Cigarettes    Quit date: 05/21/2002  . Smokeless tobacco: Never Used  . Alcohol use 0.0 oz/week     Comment: occas  . Drug use: No  . Sexual activity: No    Tobacco Counseling Counseling given: No   Activities of Daily Living In your present state of health, do you have any difficulty performing the following activities: 12/11/2016 04/17/2016  Hearing? N N  Vision? N N  Difficulty concentrating or making decisions? Y N  Walking or climbing stairs? N N  Dressing or bathing? N N  Doing errands, shopping? N -  Preparing Food and eating ? N -  Using the Toilet? N -  In the past six months, have you accidently leaked urine? N -  Do you have problems with loss of bowel control? N -  Managing your Medications? N -  Managing your Finances? N -  Housekeeping or managing your Housekeeping? N -  Some recent data might be hidden    Immunizations and Health Maintenance Immunization History  Administered Date(s) Administered  . Influenza,inj,Quad PF,36+ Mos  08/07/2015, 08/06/2016   Health Maintenance Due  Topic Date Due  . Hepatitis C Screening  1955-11-27  . HIV Screening  08/03/1970  . PAP SMEAR  08/03/1976    Patient Care Team: Gildardo Cranker, DO as PCP - General (Internal Medicine) Nicholas Lose, MD as Consulting Physician (Oncology) Erroll Luna, MD as Consulting Physician (General Surgery) Thea Silversmith, MD as Consulting Physician (Radiation Oncology)  Indicate any recent [place] you may have received from other than [facility] providers in the past year (date may be approximate).     Assessment:   This is a routine wellness examination for Michele Page.  Hearing/Vision screen Hearing Screening Comments: Pt has not had a hearing screen in several years but denies any problems with hearing.  Vision Screening Comments: Last eye exam done at [facility] in Dec. 2017.   Dietary issues and exercise activities discussed: Current Exercise Habits: Structured exercise class, Type of exercise: strength training/weights;treadmill, Time (Minutes): 60, Frequency (Times/Week): 5, Weekly Exercise (Minutes/Week): 300, Intensity: Mild  Goals    . Increase water intake          Starting 12/11/16, I will attempt to increase my water intake from 2 glasses to 5 glasses per day.       Depression Screen PHQ 2/9 Scores 12/11/2016 08/11/2016 05/15/2016 05/13/2016 01/23/2015  PHQ - 2 Score 0 0 0 0 0    Fall Risk Fall Risk  12/11/2016 08/14/2016 08/11/2016 08/06/2016 05/15/2016  Falls in the past year? No No No No No    Cognitive Function: MMSE - Mini Mental State Exam 12/11/2016  Orientation to time 5  Orientation to Place 4  Registration 3  Attention/ Calculation 5  Recall 2  Language- name 2 objects 2  Language- repeat 1  Language- follow 3 step command 2  Language- read & follow direction 1  Write a sentence 1  Copy design 1  Total score 27        Screening Tests Health Maintenance  Topic Date Due  . Hepatitis C Screening  1956-07-04    . HIV Screening  08/03/1970  . PAP SMEAR  08/03/1976  . ZOSTAVAX  11/17/2019 (Originally 08/04/2015)  . TETANUS/TDAP  11/17/2019 (Originally 08/03/1974)  . MAMMOGRAM  03/31/2018  . COLONOSCOPY  07/07/2018  . INFLUENZA VACCINE  Completed      Plan:    I have personally reviewed and addressed the Medicare Annual Wellness questionnaire and have noted the following in the patient's chart:  A. Medical and social history B. Use of alcohol, tobacco or illicit drugs  C. Current medications and supplements D. Functional ability and status E.  Nutritional status F.  Physical activity G. Advance directives H. List of other physicians I.  Hospitalizations, surgeries, and ER visits in previous 12 months J.  Vitals K. Screenings  to include hearing, vision, cognitive, depression L. Referrals and appointments - none  In addition, I have reviewed and discussed with patient certain preventive protocols, quality metrics, and best practice recommendations. A written personalized care plan for preventive services as well as general preventive health recommendations were provided to patient.  See attached scanned questionnaire for additional information.   Signed,   Allyn Kenner, LPN Health Advisor    I have reviewed the health advisor's note and was available for consultation. I agree with documentation and plan.   Kathlyn Leachman S. Perlie Gold  [facility] and Adult Medicine [address] [place], [place] [address] [phone] Cell (Monday-Friday 8 AM - 5 PM) [phone] After 5 PM and follow prompts

## 2016-12-18 ENCOUNTER — Other Ambulatory Visit: Payer: Medicare HMO

## 2016-12-18 DIAGNOSIS — I10 Essential (primary) hypertension: Secondary | ICD-10-CM | POA: Diagnosis not present

## 2016-12-18 DIAGNOSIS — C50412 Malignant neoplasm of upper-outer quadrant of left female breast: Secondary | ICD-10-CM | POA: Diagnosis not present

## 2016-12-18 DIAGNOSIS — I6930 Unspecified sequelae of cerebral infarction: Secondary | ICD-10-CM

## 2016-12-18 DIAGNOSIS — E782 Mixed hyperlipidemia: Secondary | ICD-10-CM | POA: Diagnosis not present

## 2016-12-18 DIAGNOSIS — Z170 Estrogen receptor positive status [ER+]: Secondary | ICD-10-CM | POA: Diagnosis not present

## 2016-12-18 LAB — COMPLETE METABOLIC PANEL WITH GFR
ALT: 15 U/L (ref 6–29)
AST: 19 U/L (ref 10–35)
Albumin: 4.3 g/dL (ref 3.6–5.1)
Alkaline Phosphatase: 74 U/L (ref 33–130)
BUN: 17 mg/dL (ref 7–25)
CALCIUM: 9.2 mg/dL (ref 8.6–10.4)
CHLORIDE: 105 mmol/L (ref 98–110)
CO2: 25 mmol/L (ref 20–31)
CREATININE: 1.49 mg/dL — AB (ref 0.50–0.99)
GFR, Est African American: 43 mL/min — ABNORMAL LOW (ref >=60)
GFR, Est Non African American: 38 mL/min — ABNORMAL LOW (ref >=60)
GLUCOSE: 120 mg/dL — AB (ref 65–99)
POTASSIUM: 3.9 mmol/L (ref 3.5–5.3)
SODIUM: 140 mmol/L (ref 135–146)
Total Bilirubin: 0.4 mg/dL (ref 0.2–1.2)
Total Protein: 6.6 g/dL (ref 6.1–8.1)

## 2016-12-18 LAB — LIPID PANEL
Cholesterol: 161 mg/dL
HDL: 57 mg/dL
LDL Cholesterol: 84 mg/dL
Total CHOL/HDL Ratio: 2.8 ratio
Triglycerides: 98 mg/dL
VLDL: 20 mg/dL

## 2016-12-22 ENCOUNTER — Telehealth: Payer: Self-pay | Admitting: *Deleted

## 2016-12-22 ENCOUNTER — Encounter: Payer: Self-pay | Admitting: Nurse Practitioner

## 2016-12-22 ENCOUNTER — Ambulatory Visit (INDEPENDENT_AMBULATORY_CARE_PROVIDER_SITE_OTHER): Payer: Medicare HMO | Admitting: Nurse Practitioner

## 2016-12-22 VITALS — BP 178/68 | HR 72 | Temp 98.0°F | Resp 14 | Wt 201.0 lb

## 2016-12-22 DIAGNOSIS — H8110 Benign paroxysmal vertigo, unspecified ear: Secondary | ICD-10-CM

## 2016-12-22 DIAGNOSIS — I10 Essential (primary) hypertension: Secondary | ICD-10-CM | POA: Diagnosis not present

## 2016-12-22 NOTE — Telephone Encounter (Signed)
Patient called and left message on Clinical Intake Line that she wants a Neurologist Referral ASAP.  Tried calling patient back and LMOM to return call for reasoning for referral. Awaiting callback.

## 2016-12-22 NOTE — Progress Notes (Signed)
Careteam: Patient Care Team: Gildardo Cranker, DO as PCP - General (Internal Medicine) Nicholas Lose, MD as Consulting Physician (Oncology) Erroll Luna, MD as Consulting Physician (General Surgery) Thea Silversmith, MD as Consulting Physician (Radiation Oncology)  Advanced Directive information Does Patient Have a Medical Advance Directive?: No  No Known Allergies  Chief Complaint  Patient presents with  . Acute Visit    Feels like head is spinning when she looks down or holds head side ways x 5 days( dizziness started after stopping meloxicam)     HPI: Patient is a 61 y.o. female with a history of HTN, GERD, CVA with mild right sided residual is seen in the office today with c/o intermittent dizziness which began 5 days ago.She states that it is worse in the morning with head tilt, bending, and lying to standing movements.Once stabilized and head remains straight forward, the dizziness diminishes. She is able to continue with her normal daily activities. She states that she took Meloxicam approximately two days prior to the start of her symptoms despite requests to not take this medication per Cardiology. She denies nausea, vomiting, headache, changes in vision, LOC, or syncope. Denies recent illness, fever, chills, mylagias.     Review of Systems:  Review of Systems  Constitutional: Negative for activity change, appetite change, chills and fever.  HENT: Negative for ear discharge, ear pain, hearing loss, postnasal drip, rhinorrhea, sinus pain, sinus pressure and tinnitus.   Eyes: Negative for visual disturbance.  Respiratory: Negative for chest tightness and shortness of breath.   Cardiovascular: Negative for chest pain and palpitations.  Neurological: Positive for dizziness and light-headedness. Negative for tremors, seizures, syncope, facial asymmetry, speech difficulty, weakness, numbness and headaches.  Psychiatric/Behavioral: Negative for confusion.    Past Medical  History:  Diagnosis Date  . Arthritis    lt knee  . Breast cancer ([place])   . Breast cancer of upper-outer quadrant of left female breast ([place]) 04/02/2016  . Colon polyps   . COPD (chronic obstructive pulmonary disease) ([place])   . Hyperlipidemia   . Hypertension   . Multiple sclerosis ([place])   . Stroke ([place])    2013, mild cognitive deficits  . Vision abnormalities    Past Surgical History:  Procedure Laterality Date  . HYSTEROSCOPY W/D&C N/A 12/19/2015   Procedure: DILATATION AND CURETTAGE /HYSTEROSCOPY;  Surgeon: Linda Hedges, DO;  Location: [facility];  Service: Gynecology;  Laterality: N/A;  . RADIOACTIVE SEED GUIDED MASTECTOMY WITH AXILLARY SENTINEL LYMPH NODE BIOPSY Left 04/17/2016   Procedure: RADIOACTIVE SEED GUIDED PARTIAL MASTECTOMY WITH AXILLARY SENTINEL LYMPH NODE BIOPSY;  Surgeon: Erroll Luna, MD;  Location: [facility];  Service: General;  Laterality: Left;  RADIOACTIVE SEED GUIDED PARTIAL MASTECTOMY WITH AXILLARY SENTINEL LYMPH NODE BIOPSY   . TUBAL LIGATION    . VAGINAL DELIVERY     x3   Social History:   reports that she quit smoking about 14 years ago. Her smoking use included Cigarettes. She has a 12.50 pack-year smoking history. She has never used smokeless tobacco. She reports that she drinks alcohol. She reports that she does not use drugs.  Family History  Problem Relation Age of Onset  . Stroke Sister   . Hypertension Mother   . Stroke Mother   . Alcohol abuse Brother   . HIV/AIDS Brother     Medications: Patient's Medications  New Prescriptions   No medications on file  Previous Medications   ANASTROZOLE (ARIMIDEX) 1 MG TABLET    Take  1 tablet (1 mg total) by mouth daily.   ASPIRIN EC 81 MG TABLET    Take 1 tablet (81 mg total) by mouth daily.   LABETALOL (NORMODYNE) 300 MG TABLET    TAKE TWO TABLETS BY MOUTH THREE TIMES DAILY   LOSARTAN (COZAAR) 100 MG TABLET    TAKE ONE TABLET BY MOUTH ONCE DAILY FOR BLOOD PRESSURE   NIFEDIPINE (PROCARDIA  XL/ADALAT-CC) 60 MG 24 HR TABLET    Take 120 mg by mouth daily.   PRAVASTATIN (PRAVACHOL) 10 MG TABLET    Take 1 tablet (10 mg total) by mouth at bedtime.   ZOLPIDEM (AMBIEN) 5 MG TABLET    Take 1 tablet (5 mg total) by mouth at bedtime as needed for sleep.  Modified Medications   No medications on file  Discontinued Medications   MELOXICAM (MOBIC) 15 MG TABLET    Take 15 mg by mouth daily.   NIFEDIPINE (PROCARDIA-XL/ADALAT CC) 60 MG 24 HR TABLET    TAKE ONE TABLET BY MOUTH TWICE DAILY     Physical Exam:  Vitals:   12/22/16 1029  BP: (!) 178/68  Pulse: 72  Resp: 14  Temp: 98 F (36.7 C)  TempSrc: Oral  SpO2: 97%  Weight: 201 lb (91.2 kg)   Body mass index is 35.61 kg/m.  Physical Exam  Constitutional: She is oriented to person, place, and time. She appears well-developed and well-nourished. No distress.  Eyes: Conjunctivae and EOM are normal. Pupils are equal, round, and reactive to light.  Negative nystagmus  Cardiovascular: Normal rate, regular rhythm and normal heart sounds.   No murmur heard. Pulmonary/Chest: Effort normal and breath sounds normal. No respiratory distress.  Neurological: She is alert and oriented to person, place, and time. No cranial nerve deficit. Coordination normal.  Negative Romberg, negative heel to shin, normal gait with no changes.     Labs reviewed: Basic Metabolic Panel:  Recent Labs  04/08/16 0904 07/21/16 1006 12/18/16 0001  NA 141 138 140  K 4.7 3.5 3.9  CL  --  101 105  CO2 26 26 25   GLUCOSE 124 93 120*  BUN 16.3 19 17   CREATININE 1.5* 1.50* 1.49*  CALCIUM 10.0 9.8 9.2   Liver Function Tests:  Recent Labs  01/10/16 1017 04/08/16 0904 12/18/16 0001  AST  --  25 19  ALT 26 23 15   ALKPHOS  --  90 74  BILITOT  --  0.58 0.4  PROT  --  7.6 6.6  ALBUMIN  --  4.3 4.3   No results for input(s): LIPASE, AMYLASE in the last 8760 hours. No results for input(s): AMMONIA in the last 8760 hours. CBC:  Recent Labs   04/08/16 0904  WBC 4.9  NEUTROABS 3.1  HGB 12.6  HCT 38.0  MCV 93.6  PLT 271   Lipid Panel:  Recent Labs  01/10/16 1017 05/13/16 1127 12/18/16 0001  CHOL 203* 196 161  HDL 75 64 57  LDLCALC 112* 110* 84  TRIG 80 110 98  CHOLHDL 2.7 3.1 2.8   TSH: No results for input(s): TSH in the last 8760 hours. A1C: No results found for: HGBA1C   Assessment/Plan 1. Benign paroxysmal positional vertigo, unspecified laterality -Pt education material given on BPPV.  -Will add Meclazine to help decrease dizziness -Educated on stabilization techniques before rising. -Stay well hydrated -If symptoms do not decrease and symptoms are interfering with ADL/IADL's, may refer to PT for vestibular rehab. -Call office for app. If symptoms worsen or  do not improve  2. Essential HTN -Pt has not taken Cozzar this AM -Pt states that she checks her BP at home. Reported BP this AM 135/85 -Continue scheduled medications as prescribed -Will recheck at next visit    Javohn Basey K. Harle Battiest  [facility] & Adult Medicine [phone] 8 am - 5 pm) [phone] (after hours)

## 2016-12-22 NOTE — Telephone Encounter (Signed)
Patient scheduled an appointment today with [name].

## 2016-12-22 NOTE — Patient Instructions (Signed)
May use meclizine 25 mg every 8 hours as needed for dizziness  If conts we can refer you to physical therapy   Dizziness Dizziness is a common problem. It is a feeling of unsteadiness or light-headedness. You may feel like you are about to faint. Dizziness can lead to injury if you stumble or fall. Anyone can become dizzy, but dizziness is more common in older adults. This condition can be caused by a number of things, including medicines, dehydration, or illness. Follow these instructions at home: Taking these steps may help with your condition: Eating and drinking  Drink enough fluid to keep your urine clear or pale yellow. This helps to keep you from becoming dehydrated. Try to drink more clear fluids, such as water.  Do not drink alcohol.  Limit your caffeine intake if directed by your health care provider.  Limit your salt intake if directed by your health care provider. Activity  Avoid making quick movements.  Rise slowly from chairs and steady yourself until you feel okay.  In the morning, first sit up on the side of the bed. When you feel okay, stand slowly while you hold onto something until you know that your balance is fine.  Move your legs often if you need to stand in one place for a long time. Tighten and relax your muscles in your legs while you are standing.  Do not drive or operate heavy machinery if you feel dizzy.  Avoid bending down if you feel dizzy. Place items in your home so that they are easy for you to reach without leaning over. Lifestyle  Do not use any tobacco products, including cigarettes, chewing tobacco, or electronic cigarettes. If you need help quitting, ask your health care provider.  Try to reduce your stress level, such as with yoga or meditation. Talk with your health care provider if you need help. General instructions  Watch your dizziness for any changes.  Take medicines only as directed by your health care provider. Talk with your  health care provider if you think that your dizziness is caused by a medicine that you are taking.  Tell a friend or a family member that you are feeling dizzy. If he or she notices any changes in your behavior, have this person call your health care provider.  Keep all follow-up visits as directed by your health care provider. This is important. Contact a health care provider if:  Your dizziness does not go away.  Your dizziness or light-headedness gets worse.  You feel nauseous.  You have reduced hearing.  You have new symptoms.  You are unsteady on your feet or you feel like the room is spinning. Get help right away if:  You vomit or have diarrhea and are unable to eat or drink anything.  You have problems talking, walking, swallowing, or using your arms, hands, or legs.  You feel generally weak.  You are not thinking clearly or you have trouble forming sentences. It may take a friend or family member to notice this.  You have chest pain, abdominal pain, shortness of breath, or sweating.  Your vision changes.  You notice any bleeding.  You have a headache.  You have neck pain or a stiff neck.  You have a fever. This information is not intended to replace advice given to you by your health care provider. Make sure you discuss any questions you have with your health care provider. Document Released: 04/28/2001 Document Revised: 04/09/2016 Document Reviewed: 10/29/2014 Elsevier Interactive  Patient Education  2017 [employer]  

## 2016-12-24 DIAGNOSIS — M25562 Pain in left knee: Secondary | ICD-10-CM | POA: Diagnosis not present

## 2016-12-24 DIAGNOSIS — M25512 Pain in left shoulder: Secondary | ICD-10-CM | POA: Diagnosis not present

## 2017-01-12 DIAGNOSIS — M25562 Pain in left knee: Secondary | ICD-10-CM | POA: Diagnosis not present

## 2017-01-20 ENCOUNTER — Other Ambulatory Visit: Payer: Self-pay | Admitting: *Deleted

## 2017-01-20 ENCOUNTER — Other Ambulatory Visit: Payer: Self-pay | Admitting: Cardiovascular Disease

## 2017-01-20 MED ORDER — PRAVASTATIN SODIUM 10 MG PO TABS: 10.0000 mg | ORAL_TABLET | Freq: Every day | ORAL | 1 refills | Status: DC

## 2017-01-20 NOTE — Telephone Encounter (Signed)
Michele Page

## 2017-01-22 DIAGNOSIS — M25562 Pain in left knee: Secondary | ICD-10-CM | POA: Diagnosis not present

## 2017-01-26 DIAGNOSIS — M25562 Pain in left knee: Secondary | ICD-10-CM | POA: Diagnosis not present

## 2017-02-02 ENCOUNTER — Other Ambulatory Visit: Payer: Self-pay | Admitting: Internal Medicine

## 2017-02-15 ENCOUNTER — Encounter: Payer: Self-pay | Admitting: Internal Medicine

## 2017-02-15 ENCOUNTER — Ambulatory Visit (INDEPENDENT_AMBULATORY_CARE_PROVIDER_SITE_OTHER): Payer: Medicare HMO | Admitting: Internal Medicine

## 2017-02-15 VITALS — BP 120/80 | HR 78 | Temp 97.8°F | Wt 205.0 lb

## 2017-02-15 DIAGNOSIS — Z853 Personal history of malignant neoplasm of breast: Secondary | ICD-10-CM

## 2017-02-15 DIAGNOSIS — G35 Multiple sclerosis: Secondary | ICD-10-CM

## 2017-02-15 DIAGNOSIS — M7989 Other specified soft tissue disorders: Secondary | ICD-10-CM

## 2017-02-15 DIAGNOSIS — R42 Dizziness and giddiness: Secondary | ICD-10-CM

## 2017-02-15 DIAGNOSIS — M79652 Pain in left thigh: Secondary | ICD-10-CM

## 2017-02-15 NOTE — Progress Notes (Signed)
Location:  [facility] clinic Provider: Venus Ruhe L. Mariea Clonts, D.O., C.M.D. PCP:  Dr. Eulas Post  Code Status: full code Goals of Care:  Advanced Directives 02/15/2017  Does Patient Have a Medical Advance Directive? No  Would patient like information on creating a medical advance directive? No - Patient declined     Chief Complaint  Patient presents with  . Acute Visit    left leg pain x2 weeks back of thigh    HPI: Patient is a 61 y.o. female seen today for an acute visit for left leg pain.   For 2 weeks was getting sharp pains in the back of her left upper leg. Prior to that she was getting pains in the front of her leg, but this was mild. Hurts when she sits down and gets back up. Pain stops above her knee. It doesn't stop her from walking. To her, it looks swollen, even in her calf. No new exercise or falls. She denies bruising or injury. Her orthopedic doctor took fluid out of her left knee, 2 weeks ago. Sees orthopedic for arthritis Then all of this started. Denies numbness or tingling. There is no pain in her hip. Has not taken anything for the pain. She does have MS, but according to her is not an issue. During chart review, appears she had similar episode in September of last year, with negative DVT. She doesn't remember this. She does have hx of breast cancer, is in remission at this time.   Past Medical History:  Diagnosis Date  . Arthritis    lt knee  . Breast cancer ([place])   . Breast cancer of upper-outer quadrant of left female breast ([place]) 04/02/2016  . Colon polyps   . COPD (chronic obstructive pulmonary disease) ([place])   . Hyperlipidemia   . Hypertension   . Multiple sclerosis ([place])   . Stroke ([place])    2013, mild cognitive deficits  . Vision abnormalities     Past Surgical History:  Procedure Laterality Date  . HYSTEROSCOPY W/D&C N/A 12/19/2015   Procedure: DILATATION AND CURETTAGE /HYSTEROSCOPY;  Surgeon: Linda Hedges, DO;  Location: [facility];  Service: Gynecology;  Laterality:  N/A;  . RADIOACTIVE SEED GUIDED MASTECTOMY WITH AXILLARY SENTINEL LYMPH NODE BIOPSY Left 04/17/2016   Procedure: RADIOACTIVE SEED GUIDED PARTIAL MASTECTOMY WITH AXILLARY SENTINEL LYMPH NODE BIOPSY;  Surgeon: Erroll Luna, MD;  Location: [facility];  Service: General;  Laterality: Left;  RADIOACTIVE SEED GUIDED PARTIAL MASTECTOMY WITH AXILLARY SENTINEL LYMPH NODE BIOPSY   . TUBAL LIGATION    . VAGINAL DELIVERY     x3    No Known Allergies  Allergies as of 02/15/2017   No Known Allergies     Medication List       Accurate as of 02/15/17 11:56 AM. Always use your most recent med list.          anastrozole 1 MG tablet Commonly known as:  ARIMIDEX Take 1 tablet (1 mg total) by mouth daily.   aspirin EC 81 MG tablet Take 1 tablet (81 mg total) by mouth daily.   labetalol 300 MG tablet Commonly known as:  NORMODYNE TAKE TWO TABLETS BY MOUTH THREE TIMES DAILY   losartan 100 MG tablet Commonly known as:  COZAAR TAKE ONE TABLET BY MOUTH ONCE DAILY FOR BLOOD PRESSURE   NIFEdipine 60 MG 24 hr tablet Commonly known as:  PROCARDIA-XL/ADALAT CC TAKE ONE TABLET BY MOUTH TWICE DAILY   pravastatin 10 MG tablet Commonly known as:  PRAVACHOL Take 1 tablet (10 mg total) by mouth at bedtime.   zolpidem 5 MG tablet Commonly known as:  AMBIEN Take 1 tablet (5 mg total) by mouth at bedtime as needed for sleep.       Review of Systems:  Review of Systems  Constitutional: Negative for malaise/fatigue.  Respiratory: Negative for cough and shortness of breath.   Cardiovascular: Positive for leg swelling. Negative for chest pain and palpitations.  Musculoskeletal: Positive for myalgias. Negative for falls.       Left posterior leg pain, radiating above knee. Worse when sitting and standing. Reports swelling to thigh and even calf.   Neurological: Negative for weakness.    Health Maintenance  Topic Date Due  . Hepatitis C Screening  [birth year]  . HIV Screening  08/03/1970   . PAP SMEAR  04/16/2017 (Originally 08/03/1976)  . TETANUS/TDAP  11/17/2019 (Originally 08/03/1974)  . INFLUENZA VACCINE  06/16/2017  . MAMMOGRAM  03/31/2018  . COLONOSCOPY  07/07/2018    Physical Exam: Vitals:   02/15/17 1121  BP: 120/80  Pulse: 78  Temp: 97.8 F (36.6 C)  TempSrc: Oral  SpO2: 96%  Weight: 205 lb (93 kg)   Body mass index is 36.31 kg/m. Physical Exam  Constitutional: She appears well-developed and well-nourished.  Cardiovascular: Normal rate, regular rhythm and normal heart sounds.   Pulmonary/Chest: Effort normal and breath sounds normal. No respiratory distress. She has no wheezes.  Musculoskeletal: Normal range of motion. She exhibits edema and tenderness.  Pain to left posterior thigh, and calf. Mild swelling to left calf. Minimal warmth noted to left calf. Strong pulses.   Skin: Skin is warm and dry.  Warmth to left calf, no bruise, or injury noted.     Labs reviewed: Basic Metabolic Panel:  Recent Labs  04/08/16 0904 07/21/16 1006 12/18/16 0001  NA 141 138 140  K 4.7 3.5 3.9  CL  --  101 105  CO2 26 26 25   GLUCOSE 124 93 120*  BUN 16.3 19 17   CREATININE 1.5* 1.50* 1.49*  CALCIUM 10.0 9.8 9.2   Liver Function Tests:  Recent Labs  04/08/16 0904 12/18/16 0001  AST 25 19  ALT 23 15  ALKPHOS 90 74  BILITOT 0.58 0.4  PROT 7.6 6.6  ALBUMIN 4.3 4.3   No results for input(s): LIPASE, AMYLASE in the last 8760 hours. No results for input(s): AMMONIA in the last 8760 hours. CBC:  Recent Labs  04/08/16 0904  WBC 4.9  NEUTROABS 3.1  HGB 12.6  HCT 38.0  MCV 93.6  PLT 271   Lipid Panel:  Recent Labs  05/13/16 1127 12/18/16 0001  CHOL 196 161  HDL 64 57  LDLCALC 110* 84  TRIG 110 98  CHOLHDL 3.1 2.8   Assessment/Plan 1. Left leg swelling -I'm more suspicious of a baker's cyst causing her symptoms, but also rule out DVT again since leg newly swollen and painful posteriorly in thigh and calf  - VAS [place] LOWER EXTREMITY VENOUS  (DVT); Future  2. Pain in left thigh - see above - VAS [place] LOWER EXTREMITY VENOUS (DVT); Future  3. History of breast cancer -in remission - VAS [place] LOWER EXTREMITY VENOUS (DVT); Future  4. Vertigo -pt reports this is ongoing and what happened to neuro referral--I don't see one so I placed one for this and due to MS--seems cognition not 100% either  -has had prior intracerebral hemorrhage - Ambulatory referral to Neurology  5. Multiple sclerosis ([place]) - reports  no recent symptoms from this, but also we have no MRI studies of her brain or spine and it could potentially cause vertigo and unusual leg symptoms - Ambulatory referral to Neurology  Labs/tests ordered:   Orders Placed This Encounter  Procedures  . Ambulatory referral to Neurology    Referral Priority:   Routine    Referral Type:   Consultation    Referral Reason:   Specialty Services Required    Requested Specialty:   Neurology    Number of Visits Requested:   1   Next appt:  04/07/2017  Marcelia Petersen L. Zhane Bluitt, D.O. [facility] [address], [place] [address] Cell Phone (Mon-Fri 8am-5pm):  [phone] On Call:  [phone] & follow prompts after 5pm & weekends Office Phone:  [phone] Office Fax:  [phone]

## 2017-02-16 ENCOUNTER — Telehealth: Payer: Self-pay | Admitting: *Deleted

## 2017-02-16 ENCOUNTER — Ambulatory Visit (HOSPITAL_COMMUNITY)
Admission: RE | Admit: 2017-02-16 | Discharge: 2017-02-16 | Disposition: A | Discharge status: Home / Self Care | Payer: Medicare HMO | Source: Ambulatory Visit | Attending: Vascular Surgery | Admitting: Vascular Surgery

## 2017-02-16 DIAGNOSIS — Z853 Personal history of malignant neoplasm of breast: Secondary | ICD-10-CM | POA: Diagnosis not present

## 2017-02-16 DIAGNOSIS — G35 Multiple sclerosis: Secondary | ICD-10-CM

## 2017-02-16 DIAGNOSIS — M79652 Pain in left thigh: Secondary | ICD-10-CM | POA: Diagnosis not present

## 2017-02-16 DIAGNOSIS — M7989 Other specified soft tissue disorders: Secondary | ICD-10-CM | POA: Diagnosis not present

## 2017-02-16 NOTE — Telephone Encounter (Signed)
Michele Page with [facility] called with Preliminary report of Left Lower Extremity Venous Duplex: Negative DVT, Superficial Thrombus or Bakers Cyst.

## 2017-02-16 NOTE — Telephone Encounter (Signed)
Jet for MRI brain to evaluate for MS.  Pt with historical diagnosis and no recent brain imaging.  Of course, she is not my patient and neurology referral was supposed to be placed a while back.

## 2017-02-16 NOTE — Telephone Encounter (Signed)
Ok good.  Await final report.  We will need the MRI and orthopedic notes from her visit.  I did not recognize the orthopedic surgeon's name she mentioned.  Please call pt and get name and location, request records from them.

## 2017-02-16 NOTE — Telephone Encounter (Signed)
Michele Page with [facility] called and stated that they received the Neurological referral to Dr. Reine Just for vertigo and MS. The Dr. Rodena Goldmann an MRI for the MS Dx before he will see her. Needs MRI.  I called patient and she stated that it has been well over 10 years ago and she DOES NOT remember who it was or where. She stated that she cannot request the records. I explained to her that Neurology is also requesting before they will schedule an appointment and she stated she does not know and that she was ok with getting another MRI done. Please Advise.

## 2017-02-17 ENCOUNTER — Other Ambulatory Visit: Payer: Self-pay | Admitting: Internal Medicine

## 2017-02-17 NOTE — Telephone Encounter (Signed)
Order placed

## 2017-02-22 ENCOUNTER — Other Ambulatory Visit: Payer: Self-pay | Admitting: Cardiovascular Disease

## 2017-02-22 NOTE — Telephone Encounter (Signed)
Rx(s) sent to pharmacy electronically.  

## 2017-02-25 ENCOUNTER — Telehealth: Payer: Self-pay

## 2017-02-25 DIAGNOSIS — M7989 Other specified soft tissue disorders: Secondary | ICD-10-CM

## 2017-02-25 DIAGNOSIS — M79662 Pain in left lower leg: Secondary | ICD-10-CM

## 2017-02-25 NOTE — Telephone Encounter (Signed)
This sounds worse than when I saw her.  She has had two different venous ultrasounds negative for DVT and baker's cyst.  I wonder if she has an arterial circulation problem.  Let's send her for arterial duplex with ABI at the vascular center on [facility] due to the swelling, pain.

## 2017-02-25 NOTE — Telephone Encounter (Signed)
Patient called c/o left leg pain (thigh down) . Left leg is swollen, 8.5/10 pain, and warm to the touch (off/on). Patient denies injury, drainage or redness.  Pain worse since yesterday  Patient is taking tylenol, motrin and Advil with no relief  Patient has pending appointment with Dr.Sater on 03/18/17  Last seen Dr.Reed on 02/16/17  Please advise

## 2017-02-26 NOTE — Telephone Encounter (Signed)
If pain is too severe, she will need to be seen in the ED or urgent care.  Pain was not this severe at the time of her appt with me 4/3.

## 2017-02-26 NOTE — Telephone Encounter (Signed)
Spoke with patient, patient in agreement with additional studies to be completed. Orders placed and referral coordinator informed of urgency to set up studies.  Patient ask what can be done about her pain in the meantime while she awaits appointment for studies and results.  Pleas advise

## 2017-02-27 ENCOUNTER — Ambulatory Visit
Admission: RE | Admit: 2017-02-27 | Discharge: 2017-02-27 | Disposition: A | Discharge status: Home / Self Care | Payer: Medicare HMO | Source: Ambulatory Visit | Attending: Internal Medicine | Admitting: Internal Medicine

## 2017-02-27 DIAGNOSIS — R42 Dizziness and giddiness: Secondary | ICD-10-CM | POA: Diagnosis not present

## 2017-02-27 DIAGNOSIS — G35 Multiple sclerosis: Secondary | ICD-10-CM

## 2017-03-01 ENCOUNTER — Ambulatory Visit (HOSPITAL_COMMUNITY)
Admission: RE | Admit: 2017-03-01 | Discharge: 2017-03-01 | Disposition: A | Discharge status: Home / Self Care | Payer: Medicare HMO | Source: Ambulatory Visit | Attending: Surgery | Admitting: Surgery

## 2017-03-01 DIAGNOSIS — M79605 Pain in left leg: Secondary | ICD-10-CM | POA: Insufficient documentation

## 2017-03-01 DIAGNOSIS — M7989 Other specified soft tissue disorders: Secondary | ICD-10-CM | POA: Diagnosis not present

## 2017-03-01 DIAGNOSIS — M79662 Pain in left lower leg: Secondary | ICD-10-CM

## 2017-03-04 ENCOUNTER — Encounter: Payer: Self-pay | Admitting: *Deleted

## 2017-03-04 ENCOUNTER — Encounter: Payer: Self-pay | Admitting: Internal Medicine

## 2017-03-04 ENCOUNTER — Ambulatory Visit (INDEPENDENT_AMBULATORY_CARE_PROVIDER_SITE_OTHER): Payer: Medicare HMO | Admitting: Internal Medicine

## 2017-03-04 VITALS — BP 130/78 | HR 71 | Temp 98.2°F | Wt 198.0 lb

## 2017-03-04 DIAGNOSIS — M25562 Pain in left knee: Secondary | ICD-10-CM | POA: Diagnosis not present

## 2017-03-04 DIAGNOSIS — M25462 Effusion, left knee: Secondary | ICD-10-CM | POA: Diagnosis not present

## 2017-03-04 DIAGNOSIS — I10 Essential (primary) hypertension: Secondary | ICD-10-CM

## 2017-03-04 MED ORDER — TRAMADOL HCL 50 MG PO TABS: 50.0000 mg | ORAL_TABLET | Freq: Three times a day (TID) | ORAL | 0 refills | Status: DC | PRN

## 2017-03-04 NOTE — Progress Notes (Signed)
Location:  [facility] clinic Provider: Chen Saadeh L. Mariea Clonts, D.O., C.M.D.  Code Status: full code Goals of Care:  Advanced Directives 03/04/2017  Does Patient Have a Medical Advance Directive? No  Would patient like information on creating a medical advance directive? No - Patient declined   Chief Complaint  Patient presents with  . Acute Visit    leg swollen and painful    HPI: Patient is a 61 y.o. female seen today for an acute visit for swelling of left leg and pain.  Has gotten to where it is difficult for her to walk.  Swelling has gone down a little bit on it from what it was when she called for an appt.  Can't fully bend her knee.  It hurts.  Saw orthopedics also and xrays showed arthritis.  It got more painful and swollen after the injection--saw Dr. French Ana at [facility].  She's already had a venous doppler negative for DVT and baker's cyst (x2 actually) and negative arterial doppler and abis.  MRI brain shows prior stroke and white matter changes c/w her MS diagnosis.    03/18/17, has neurology appt with Dr. Felecia Shelling for the MS fortunately.  Seems cognition is declining significantly.  Having difficulty remembering series of events related to her left knee swelling and pain.     Past Medical History:  Diagnosis Date  . Arthritis    lt knee  . Breast cancer ([place])   . Breast cancer of upper-outer quadrant of left female breast ([place]) 04/02/2016  . Colon polyps   . COPD (chronic obstructive pulmonary disease) ([place])   . Hyperlipidemia   . Hypertension   . Multiple sclerosis ([place])   . Stroke ([place])    2013, mild cognitive deficits  . Vision abnormalities     Past Surgical History:  Procedure Laterality Date  . HYSTEROSCOPY W/D&C N/A 12/19/2015   Procedure: DILATATION AND CURETTAGE /HYSTEROSCOPY;  Surgeon: Linda Hedges, DO;  Location: [facility];  Service: Gynecology;  Laterality: N/A;  . RADIOACTIVE SEED GUIDED MASTECTOMY WITH AXILLARY SENTINEL LYMPH NODE BIOPSY Left 04/17/2016   Procedure:  RADIOACTIVE SEED GUIDED PARTIAL MASTECTOMY WITH AXILLARY SENTINEL LYMPH NODE BIOPSY;  Surgeon: Erroll Luna, MD;  Location: [facility];  Service: General;  Laterality: Left;  RADIOACTIVE SEED GUIDED PARTIAL MASTECTOMY WITH AXILLARY SENTINEL LYMPH NODE BIOPSY   . TUBAL LIGATION    . VAGINAL DELIVERY     x3    No Known Allergies  Allergies as of 03/04/2017   No Known Allergies     Medication List       Accurate as of 03/04/17  2:54 PM. Always use your most recent med list.          anastrozole 1 MG tablet Commonly known as:  ARIMIDEX Take 1 tablet (1 mg total) by mouth daily.   aspirin EC 81 MG tablet Take 1 tablet (81 mg total) by mouth daily.   labetalol 300 MG tablet Commonly known as:  NORMODYNE TAKE TWO TABLETS BY MOUTH THREE TIMES DAILY   losartan 100 MG tablet Commonly known as:  COZAAR TAKE ONE TABLET BY MOUTH ONCE DAILY FOR BLOOD PRESSURE   NIFEdipine 60 MG 24 hr tablet Commonly known as:  PROCARDIA-XL/ADALAT CC Take 1 tablet (60 mg total) by mouth 2 (two) times daily.   pravastatin 10 MG tablet Commonly known as:  PRAVACHOL Take 1 tablet (10 mg total) by mouth at bedtime.   zolpidem 5 MG tablet Commonly known as:  AMBIEN Take  1 tablet (5 mg total) by mouth at bedtime as needed for sleep.       Review of Systems:  Review of Systems  Cardiovascular:       Just took bp meds at 2pm when she was brought back by [facility]  Musculoskeletal: Positive for joint pain.       Left knee swelling and pain around entire knee and posteriorly; limping  Psychiatric/Behavioral: Positive for memory loss.    Health Maintenance  Topic Date Due  . Hepatitis C Screening  June 12, 1956  . HIV Screening  08/03/1970  . PAP SMEAR  04/16/2017 (Originally 08/03/1976)  . TETANUS/TDAP  11/17/2019 (Originally 08/03/1974)  . INFLUENZA VACCINE  06/16/2017  . MAMMOGRAM  03/31/2018  . COLONOSCOPY  07/07/2018    Physical Exam: Vitals:   03/04/17 1421  BP: (!) 150/80    Pulse: 71  Temp: 98.2 F (36.8 C)  TempSrc: Oral  SpO2: 93%  Weight: 198 lb (89.8 kg)   Body mass index is 35.07 kg/m. Physical Exam  Constitutional: She is oriented to person, place, and time.  Cardiovascular: Normal rate and regular rhythm.   Pulmonary/Chest: Effort normal and breath sounds normal.  Musculoskeletal:  Left knee tender around entire knee, small effusion present, also tender posteriorly, difficulty with flexion of knee  Neurological: She is alert and oriented to person, place, and time.  Poor historian, short term memory loss  Skin: Skin is warm and dry.    Labs reviewed: Basic Metabolic Panel:  Recent Labs  04/08/16 0904 07/21/16 1006 12/18/16 0001  NA 141 138 140  K 4.7 3.5 3.9  CL  --  101 105  CO2 26 26 25   GLUCOSE 124 93 120*  BUN 16.3 19 17   CREATININE 1.5* 1.50* 1.49*  CALCIUM 10.0 9.8 9.2   Liver Function Tests:  Recent Labs  04/08/16 0904 12/18/16 0001  AST 25 19  ALT 23 15  ALKPHOS 90 74  BILITOT 0.58 0.4  PROT 7.6 6.6  ALBUMIN 4.3 4.3   No results for input(s): LIPASE, AMYLASE in the last 8760 hours. No results for input(s): AMMONIA in the last 8760 hours. CBC:  Recent Labs  04/08/16 0904  WBC 4.9  NEUTROABS 3.1  HGB 12.6  HCT 38.0  MCV 93.6  PLT 271   Lipid Panel:  Recent Labs  05/13/16 1127 12/18/16 0001  CHOL 196 161  HDL 64 57  LDLCALC 110* 84  TRIG 110 98  CHOLHDL 3.1 2.8   No results found for: HGBA1C  Procedures since last visit: Mr Brain Wo Contrast  Result Date: 02/27/2017 CLINICAL DATA:  Multiple sclerosis. Dizziness the right side of the head for 2 months. Personal history of stroke and breast cancer. EXAM: MRI HEAD WITHOUT CONTRAST TECHNIQUE: Multiplanar, multiecho pulse sequences of the brain and surrounding structures were obtained without intravenous contrast. COMPARISON:  None. FINDINGS: Brain: A remote hemorrhagic infarct of the left basal ganglia and thalamus is noted. There is wallerian  degeneration extending into the left cerebral peduncle. The diffusion-weighted images demonstrate no acute or subacute infarct. Extensive confluent periventricular and scattered subcortical white matter changes are advanced for age. There is some thinning of the corpus callosum. A stack a dilation of the left lateral ventricle is associated with the prior infarcts. White matter changes extend into the brainstem. The cerebellum is unremarkable. The internal auditory canals are normal bilaterally. Vascular: Flow is present in the major intracranial arteries. Skull and upper cervical spine: The skullbase is within normal limits.  Midline sagittal structures are unremarkable. Craniocervical junction is normal. Degenerative changes are noted at C3-4. Sinuses/Orbits: Moderate mucosal thickening is present along the inferior left maxillary sinus. There is mild disease along the floor of the right maxillary sinus. The remaining paranasal sinuses and mastoid air cells are clear. IMPRESSION: 1. No acute intracranial abnormality. 2. Remote hemorrhagic infarct involving the left basal ganglia and thalamus. 3. Extensive periventricular and subcortical white matter disease bilaterally. The pattern is nonspecific. Given history of multiple sclerosis, this may in part represent demyelinating disease. Small vessel ischemic changes likely contribute finding as well. Electronically Signed   By: San Morelle M.D.   On: 02/27/2017 10:51    Assessment/Plan 1. Pain and swelling of left knee -requested records from Dr. Deloris Ping about her visits to orthopedics -pt given 30 pills of tramadol short term to help with pain -also given these instructions:  I suspect you have a torn meniscus in your knee.   We'll get an MRI of the knee done to assess for this.   In the meantime, rest your leg, elevate it, and place ice over the swollen painful areas.  When you are up and about, wrapping with an ace wrap may be  helpful.   2. Essential hypertension -bp improved to 130/70 with recheck about 1 hr later -cont same regimen  Labs/tests ordered:  No new today Next appt:  04/07/2017  Arielis Leonhart L. Korinna Tat, D.O. [facility] [address], [place] [address] Cell Phone (Mon-Fri 8am-5pm):  [phone] On Call:  [phone] & follow prompts after 5pm & weekends Office Phone:  [phone] Office Fax:  [phone]

## 2017-03-05 ENCOUNTER — Telehealth: Payer: Self-pay | Admitting: *Deleted

## 2017-03-05 NOTE — Telephone Encounter (Signed)
Per the office notes from [facility], Dr. Cyndi Lennert plan is:   Notify patient that I reviewed ortho notes and MRI I recommend PT eval as suggested by Murphy/[facility] and follow-up with Dr. French Ana for series of injections they recommended for her cartilage damage.   Marland Kitchenleft message to have patient return my call.

## 2017-03-08 ENCOUNTER — Other Ambulatory Visit: Payer: Self-pay | Admitting: Internal Medicine

## 2017-03-08 DIAGNOSIS — G4700 Insomnia, unspecified: Secondary | ICD-10-CM

## 2017-03-09 DIAGNOSIS — M25562 Pain in left knee: Secondary | ICD-10-CM | POA: Diagnosis not present

## 2017-03-10 ENCOUNTER — Ambulatory Visit (HOSPITAL_COMMUNITY)
Admission: RE | Admit: 2017-03-10 | Discharge: 2017-03-10 | Disposition: A | Discharge status: Home / Self Care | Payer: Medicare HMO | Source: Ambulatory Visit | Attending: Vascular Surgery | Admitting: Vascular Surgery

## 2017-03-10 DIAGNOSIS — M79662 Pain in left lower leg: Secondary | ICD-10-CM | POA: Diagnosis not present

## 2017-03-10 DIAGNOSIS — M7989 Other specified soft tissue disorders: Secondary | ICD-10-CM | POA: Insufficient documentation

## 2017-03-10 LAB — VAS US LOWER EXTREMITY ARTERIAL DUPLEX
RIGHT ANT DIST TIBAL SYS PSV: 48 cm/s
RIGHT POST TIB DIST SYS: 75 cm/s
Right super femoral dist sys PSV: -126 cm/s
Right super femoral mid sys PSV: -79 cm/s
Right super femoral prox sys PSV: -100 cm/s

## 2017-03-17 ENCOUNTER — Telehealth: Payer: Self-pay | Admitting: *Deleted

## 2017-03-17 NOTE — Telephone Encounter (Signed)
Patient called and left message on Clinical Intake Line to Return call.   I tried calling patient back and it went straight to Voicemail. LMOM to return call.

## 2017-03-18 ENCOUNTER — Ambulatory Visit (INDEPENDENT_AMBULATORY_CARE_PROVIDER_SITE_OTHER): Payer: Medicare HMO | Admitting: Neurology

## 2017-03-18 ENCOUNTER — Encounter: Payer: Self-pay | Admitting: Neurology

## 2017-03-18 VITALS — BP 136/87 | HR 74 | Resp 18 | Ht 63.0 in | Wt 197.0 lb

## 2017-03-18 DIAGNOSIS — F09 Unspecified mental disorder due to known physiological condition: Secondary | ICD-10-CM

## 2017-03-18 DIAGNOSIS — I10 Essential (primary) hypertension: Secondary | ICD-10-CM

## 2017-03-18 DIAGNOSIS — I6930 Unspecified sequelae of cerebral infarction: Secondary | ICD-10-CM | POA: Diagnosis not present

## 2017-03-18 DIAGNOSIS — I69359 Hemiplegia and hemiparesis following cerebral infarction affecting unspecified side: Secondary | ICD-10-CM | POA: Diagnosis not present

## 2017-03-18 DIAGNOSIS — G35 Multiple sclerosis: Secondary | ICD-10-CM | POA: Diagnosis not present

## 2017-03-18 DIAGNOSIS — G35D Multiple sclerosis, unspecified: Secondary | ICD-10-CM

## 2017-03-18 NOTE — Progress Notes (Signed)
[facility]  PATIENT: Michele Page DOB: 1956/10/14  REFERRING DOCTOR OR PCP:  Gildardo Cranker SOURCE: patient  _________________________________   HISTORICAL  CHIEF COMPLAINT:  Chief Complaint  Patient presents with  . Multiple Sclerosis    Last seen by Dr. Felecia Shelling 03/20/15.  Remains off of any dmt. Denies new stroke sx.  Last MRI brain done 02/27/17. Since last ov, she has been treated for left breast cancer (lumpectomy and radiation) and sts. she is now in remission./fim    HISTORY OF PRESENT ILLNESS:  Michele Page is a 61 yo woman with MS and h/o CVA.     Since last visit, she has also been treated for breast cancer with lumpectomy (03/2016) and radiation and appears to be in remission.   Patient is poor historian.  MS:   She continues off medications.  I reviewed the MRI of the brain from 02/27/2017. There is a remote hemorrhage involving the left thalamus and adjacent internal capsule little bit of the basal ganglia. Some wallerian degeneration is noted. She also has extensive T2/FLAIR hyperintense foci in the hemispheres some radially oriented in the ventricles and some of the juxtacortical white matter. This likely represents a combination of demyelination and chronic microvascular ischemic change.  There were no acute findings.  MS History:   About 2008, she had the onset of left sided numbness. The symptoms came on fairly quickly. They resolved over 2-3 weeks. She had an MRI at the time that showed lesions consistent with MS. She thinks she may have had a lumbar puncture as well. Her doctor at the time advised that she start medications for MS. However, all of the options were self injectable at that time and she preferred not to begin any of the therapies. She feels that she has done fairly well since that time and denies any new exacerbations. A few months ago, she moved down to [place] from [place].  Stroke History:  She has elevated blood pressure and had  around 2011. She had the sudden onset of right arm and leg weakness. Additionally, she was having a lot of speech difficulties initially. Over time, the weakness improved and her speech improved, though she does not feel that they got back to baseline. She notes difficulty with handwriting and other skilled tasks with the right hand, even though she feels her strength is fine.Marland Kitchen MRI was performed in the hospital at that time (she does not remember where). Her symptoms and imaging studies were felt to be more consistent with stroke than to an MS exacerbation. She was placed on aspirin and continues to take 1 baby aspirin daily.  Gait/strength/sensation: Since the stroke there is some right sided weakness and very mild numbness and clumsiness.   This has led to a clumsy gait.   She walks without a cane and the right foot sometimes drags a bit She notes mild weakness in the right leg. She denies any numbness in either leg. She denies any actual weakness in the right arm but notes that since his stroke that her handwriting has been poor.  Bladder/Bowel:   She denies any significant bowel or bladder issue.  She has 1-2 x nocturia at night.  Vision: She denies any significant problems with her vision. She wears glasses for correction but notes no MS related vision problem. There is no diplopia.   Fatigue/Sleep:   She feels her fatigue is minimal and she notes no major difficulty with heat. She falls asleep easily.  She has 1-2 nocturia and quickly falls back asleep.   She snores but denies any excessive daytime sleepiness. Her husband reportedly has not noted pauses or gasping at night.  Mood/Cognition:   She denies anxiety or depression.       Cognition   She has major cognitive difficulty.  She has a lot of difficulty with memory and often needs hints/cues.   She feels LTM is better.   She notes some difficulty coming up with the right words.   She also feels processing speed is reduced.  REVIEW OF  SYSTEMS: Constitutional: No fevers, chills, sweats, or change in appetite Eyes: No visual changes, double vision, eye pain Ear, nose and throat: No hearing loss, ear pain, nasal congestion, sore throat Cardiovascular: No chest pain, palpitations Respiratory: No shortness of breath at rest or with exertion.   No wheezes GastrointestinaI: No nausea, vomiting, diarrhea, abdominal pain, fecal incontinence Genitourinary: No dysuria, urinary retention or frequency.  She has nocturia. She has mildly elevated creatinine  Musculoskeletal: No neck pain, back pain Integumentary: No rash, pruritus, skin lesions Neurological: as above Psychiatric: No depression at this time.  No anxiety.  She notes some memory loss. Endocrine: No palpitations, diaphoresis, change in appetite, change in weigh or increased thirst Hematologic/Lymphatic: No anemia, purpura, petechiae. Allergic/Immunologic: No itchy/runny eyes, nasal congestion, recent allergic reactions, rashes  ALLERGIES: No Known Allergies  HOME MEDICATIONS:  Current Outpatient Prescriptions:  .  anastrozole (ARIMIDEX) 1 MG tablet, Take 1 tablet (1 mg total) by mouth daily., Disp: 90 tablet, Rfl: 3 .  aspirin EC 81 MG tablet, Take 1 tablet (81 mg total) by mouth daily., Disp: 30 tablet, Rfl: 6 .  labetalol (NORMODYNE) 300 MG tablet, TAKE TWO TABLETS BY MOUTH THREE TIMES DAILY, Disp: 180 tablet, Rfl: 4 .  losartan (COZAAR) 100 MG tablet, TAKE ONE TABLET BY MOUTH ONCE DAILY FOR BLOOD PRESSURE, Disp: 90 tablet, Rfl: 1 .  NIFEdipine (PROCARDIA-XL/ADALAT CC) 60 MG 24 hr tablet, Take 1 tablet (60 mg total) by mouth 2 (two) times daily., Disp: 60 tablet, Rfl: 1 .  pravastatin (PRAVACHOL) 10 MG tablet, Take 1 tablet (10 mg total) by mouth at bedtime., Disp: 90 tablet, Rfl: 1 .  zolpidem (AMBIEN) 5 MG tablet, TAKE ONE TABLET BY MOUTH AT BEDTIME AS NEEDED FOR SLEEP, Disp: 30 tablet, Rfl: 0 .  traMADol (ULTRAM) 50 MG tablet, Take 1 tablet (50 mg total) by  mouth every 8 (eight) hours as needed for severe pain. (Patient not taking: Reported on 03/18/2017), Disp: 30 tablet, Rfl: 0  PAST MEDICAL HISTORY: Past Medical History:  Diagnosis Date  . Arthritis    lt knee  . Breast cancer ([place])   . Breast cancer of upper-outer quadrant of left female breast ([place]) 04/02/2016  . Colon polyps   . COPD (chronic obstructive pulmonary disease) ([place])   . Hyperlipidemia   . Hypertension   . Multiple sclerosis ([place])   . Stroke ([place])    2013, mild cognitive deficits  . Vision abnormalities     PAST SURGICAL HISTORY: Past Surgical History:  Procedure Laterality Date  . HYSTEROSCOPY W/D&C N/A 12/19/2015   Procedure: DILATATION AND CURETTAGE /HYSTEROSCOPY;  Surgeon: Linda Hedges, DO;  Location: [facility];  Service: Gynecology;  Laterality: N/A;  . RADIOACTIVE SEED GUIDED MASTECTOMY WITH AXILLARY SENTINEL LYMPH NODE BIOPSY Left 04/17/2016   Procedure: RADIOACTIVE SEED GUIDED PARTIAL MASTECTOMY WITH AXILLARY SENTINEL LYMPH NODE BIOPSY;  Surgeon: Erroll Luna, MD;  Location: [facility];  Service:  General;  Laterality: Left;  RADIOACTIVE SEED GUIDED PARTIAL MASTECTOMY WITH AXILLARY SENTINEL LYMPH NODE BIOPSY   . TUBAL LIGATION    . VAGINAL DELIVERY     x3    FAMILY HISTORY: Family History  Problem Relation Age of Onset  . Stroke Sister   . Hypertension Mother   . Stroke Mother   . Alcohol abuse Brother   . HIV/AIDS Brother     SOCIAL HISTORY:  Social History   Social History  . Marital status: Married    Spouse name: N/A  . Number of children: 3  . Years of education: N/A   Occupational History  . retired    Social History Main Topics  . Smoking status: Former Smoker    Packs/day: 0.50    Years: 25.00    Types: Cigarettes    Quit date: 05/21/2002  . Smokeless tobacco: Never Used  . Alcohol use 0.0 oz/week     Comment: occas  . Drug use: No  . Sexual activity: No   Other Topics Concern  . Not on file   Social History  Narrative   Diet- N/A   Caffeine- Yes   Married- Yes   House- 2 story with 2 people   Pets- No   Current/past profession- Nurse, mental health   Exercise- Yes   Living will-No   DNR-N/A   POA/HPOA-No           PHYSICAL EXAM  Vitals:   03/18/17 0850  BP: 136/87  Pulse: 74  Resp: 18  Weight: 197 lb (89.4 kg)  Height: 5\' 3"  (1.6 m)    Body mass index is 34.9 kg/m.   General: The patient is well-developed and well-nourished and in no acute distress   Neurologic Exam  Mental status: The patient is alert and oriented x 3 at the time of the examination. The patient has reduced recent and remote memory, and a reduced  apparently attention span and concentration ability.   Speech has a few errors.  She understands well.  She has a right hand apraxia.  Cranial nerves: Extraocular movements are full. Pupils are equal, round, and reactive to light and accomodation.  Facial strength and sensation is normal. Trapezius and sternocleidomastoid strength is normal. No dysarthria is noted.  The tongue is midline, and the patient has symmetric elevation of the soft palate. No obvious hearing deficits are noted.  Motor:  Muscle bulk is normal.   Tone is normal. Strength is  5 / 5 in  both arms and the left leg and 4+ over 5 in the right leg.   Sensory: Sensory testing is intact to pinprick, soft touch and vibration sensation in all 4 extremities.  Coordination: Cerebellar testing reveals good  left and mildly reduced right finger-nose-finger and  poor right heel-to-shin . She has difficulty writing  Gait and station: Station is normal.   Gait is wide with slight right foot drop.  Tandem gait is wide.    Romberg is negative.   Reflexes: Deep tendon reflexes are symmetric and normal bilaterally in arms and slightly elevated in the right leg compared to the left.       DIAGNOSTIC DATA (LABS, IMAGING, TESTING) - I reviewed patient records, labs, notes, testing and imaging myself where  available.  Lab Results  Component Value Date   WBC 4.9 04/08/2016   HGB 12.6 04/08/2016   HCT 38.0 04/08/2016   MCV 93.6 04/08/2016   PLT 271 04/08/2016      Component  Value Date/Time   NA 140 12/18/2016 0001   NA 141 04/08/2016 0904   K 3.9 12/18/2016 0001   K 4.7 04/08/2016 0904   CL 105 12/18/2016 0001   CO2 25 12/18/2016 0001   CO2 26 04/08/2016 0904   GLUCOSE 120 (H) 12/18/2016 0001   GLUCOSE 124 04/08/2016 0904   BUN 17 12/18/2016 0001   BUN 16.3 04/08/2016 0904   CREATININE 1.49 (H) 12/18/2016 0001   CREATININE 1.5 (H) 04/08/2016 0904   CALCIUM 9.2 12/18/2016 0001   CALCIUM 10.0 04/08/2016 0904   PROT 6.6 12/18/2016 0001   PROT 7.6 04/08/2016 0904   ALBUMIN 4.3 12/18/2016 0001   ALBUMIN 4.3 04/08/2016 0904   AST 19 12/18/2016 0001   AST 25 04/08/2016 0904   ALT 15 12/18/2016 0001   ALT 23 04/08/2016 0904   ALKPHOS 74 12/18/2016 0001   ALKPHOS 90 04/08/2016 0904   BILITOT 0.4 12/18/2016 0001   BILITOT 0.58 04/08/2016 0904   GFRNONAA 38 (L) 12/18/2016 0001   GFRAA 43 (L) 12/18/2016 0001    Lab Results  Component Value Date   TSH 1.830 01/23/2015       ASSESSMENT AND PLAN  Multiple sclerosis ([place])  Hemiplegia following CVA (cerebrovascular accident) ([place])  Cognitive disorder  Accelerated hypertension  History of stroke with residual deficit   1.   I reviewed the MRI of the brain. It shows her remote hemorrhagic thalamic/internal capsule stroke on the left causing her right-sided symptoms. Additionally there is extensive T2/FLAIR hyperintense foci with some periventricular foci that are radially oriented to the ventricles. This likely represents a combination of MS and chronic microvascular ischemic change. None of the findings were acute. It. She prefers to stay off of a disease modifying therapy. I will want to recheck an MRI in a year or 2 to see if there is progression. At that time we will also check the study with and without contrast to make  sure that small acute foci are not being missed. If changes consistent with active MS are noted, I will want her to reconsider a disease modifying therapy. 2.   We discussed the importance of staying on her hypertensive medication 3.    She will return to see me in one year or sooner if there are new or worsening neurologic symptoms. Richard A. Felecia Shelling, MD, PhD 06/20/8849, 2:77 AM Certified in Neurology, Clinical Neurophysiology, Sleep Medicine, Pain Medicine and Neuroimaging  [facility] [address], [place], [place] [address] [phone]

## 2017-03-29 ENCOUNTER — Other Ambulatory Visit: Payer: Self-pay | Admitting: Internal Medicine

## 2017-03-30 ENCOUNTER — Ambulatory Visit (INDEPENDENT_AMBULATORY_CARE_PROVIDER_SITE_OTHER): Payer: Medicare HMO | Admitting: Cardiovascular Disease

## 2017-03-30 ENCOUNTER — Encounter: Payer: Self-pay | Admitting: Cardiovascular Disease

## 2017-03-30 VITALS — BP 136/72 | HR 70

## 2017-03-30 DIAGNOSIS — E7800 Pure hypercholesterolemia, unspecified: Secondary | ICD-10-CM | POA: Diagnosis not present

## 2017-03-30 DIAGNOSIS — I10 Essential (primary) hypertension: Secondary | ICD-10-CM | POA: Diagnosis not present

## 2017-03-30 NOTE — Assessment & Plan Note (Signed)
History of hypertension blood pressure measured today 136/72. She is on labetalol and Procardia. Continue current meds at current dosing

## 2017-03-30 NOTE — Patient Instructions (Signed)
Medication Instructions: Your physician recommends that you continue on your current medications as directed. Please refer to the Current Medication list given to you today.   Follow-Up: We request that you follow-up in: 12 months with an extender and in 24 months with Dr Andria Rhein will receive a reminder letter in the mail two months in advance. If you don't receive a letter, please call our office to schedule the follow-up appointment.  If you need a refill on your cardiac medications before your next appointment, please call your pharmacy.

## 2017-03-30 NOTE — Progress Notes (Signed)
03/30/2017 Michele Page   Jan 14, 1956  [id number]  Primary Physician Gildardo Cranker, DO Primary Cardiologist: Lorretta Harp MD Renae Gloss  HPI:  Michele Page is a 61 year old moderately overweight married African-American female mother of 3 children, grandmother of 57 grandchildren recently revealed relocated from [place] to [place] 3 years ago. She is retired from working in her own daycare. I last saw her in the office 03/12/16 She is a history of hypertension and hypokalemia. She's had a stroke 4 years ago leaving her with some motor and cognitive deficit deficits. She's never had a heart attack. She denies chest pain or shortness of breath. She was referred here for further management of her hypertension. Since I saw her a year ago her blood pressure has remained well controlled on her current medical regimen.   Current Outpatient Prescriptions  Medication Sig Dispense Refill  . anastrozole (ARIMIDEX) 1 MG tablet Take 1 tablet (1 mg total) by mouth daily. 90 tablet 3  . aspirin EC 81 MG tablet Take 1 tablet (81 mg total) by mouth daily. 30 tablet 6  . labetalol (NORMODYNE) 300 MG tablet TAKE TWO TABLETS BY MOUTH THREE TIMES DAILY 180 tablet 4  . NIFEdipine (PROCARDIA-XL/ADALAT CC) 60 MG 24 hr tablet Take 1 tablet (60 mg total) by mouth 2 (two) times daily. 60 tablet 1  . pravastatin (PRAVACHOL) 10 MG tablet Take 1 tablet (10 mg total) by mouth at bedtime. 90 tablet 1  . traMADol (ULTRAM) 50 MG tablet Take 1 tablet (50 mg total) by mouth every 8 (eight) hours as needed for severe pain. 30 tablet 0   No current facility-administered medications for this visit.     No Known Allergies  Social History   Social History  . Marital status: Married    Spouse name: N/A  . Number of children: 3  . Years of education: N/A   Occupational History  . retired    Social History Main Topics  . Smoking status: Former Smoker    Packs/day: 0.50    Years: 25.00   Types: Cigarettes    Quit date: 05/21/2002  . Smokeless tobacco: Never Used  . Alcohol use 0.0 oz/week     Comment: occas  . Drug use: No  . Sexual activity: No   Other Topics Concern  . Not on file   Social History Narrative   Diet- N/A   Caffeine- Yes   Married- Yes   House- 2 story with 2 people   Pets- No   Current/past profession- Nurse, mental health   Exercise- Yes   Living will-No   DNR-N/A   POA/HPOA-No           Review of Systems: General: negative for chills, fever, night sweats or weight changes.  Cardiovascular: negative for chest pain, dyspnea on exertion, edema, orthopnea, palpitations, paroxysmal nocturnal dyspnea or shortness of breath Dermatological: negative for rash Respiratory: negative for cough or wheezing Urologic: negative for hematuria Abdominal: negative for nausea, vomiting, diarrhea, bright red blood per rectum, melena, or hematemesis Neurologic: negative for visual changes, syncope, or dizziness All other systems reviewed and are otherwise negative except as noted above.    Blood pressure 136/72, pulse 70.  General appearance: alert and no distress Neck: no adenopathy, no carotid bruit, no JVD, supple, symmetrical, trachea midline and thyroid not enlarged, symmetric, no tenderness/mass/nodules Lungs: clear to auscultation bilaterally Heart: regular rate and rhythm, S1, S2 normal, no murmur, click, rub or gallop Extremities:  extremities normal, atraumatic, no cyanosis or edema  EKG sinus rhythm at 70 with nonspecific ST and T-wave changes. I personally reviewed this EKG  ASSESSMENT AND PLAN:   Accelerated hypertension History of hypertension blood pressure measured today 136/72. She is on labetalol and Procardia. Continue current meds at current dosing  Hyperlipidemia History of hyperlipidemia on pravastatin with lipid profile performed 12/18/16 revealing total cholesterol 161, LDL 84 and HDL of 57.      Lorretta Harp MD  [facility] 03/30/2017 10:23 AM

## 2017-03-30 NOTE — Assessment & Plan Note (Signed)
History of hyperlipidemia on pravastatin with lipid profile performed 12/18/16 revealing total cholesterol 161, LDL 84 and HDL of 57.

## 2017-04-02 ENCOUNTER — Ambulatory Visit: Payer: Commercial Managed Care - HMO | Admitting: Cardiovascular Disease

## 2017-04-05 ENCOUNTER — Other Ambulatory Visit: Payer: Self-pay | Admitting: Cardiovascular Disease

## 2017-04-05 DIAGNOSIS — I10 Essential (primary) hypertension: Secondary | ICD-10-CM

## 2017-04-05 NOTE — Telephone Encounter (Signed)
Rx request sent to pharmacy.  

## 2017-04-07 ENCOUNTER — Ambulatory Visit: Payer: Commercial Managed Care - HMO | Admitting: Internal Medicine

## 2017-04-09 ENCOUNTER — Ambulatory Visit: Payer: Medicare HMO | Admitting: Internal Medicine

## 2017-04-22 ENCOUNTER — Ambulatory Visit (HOSPITAL_BASED_OUTPATIENT_CLINIC_OR_DEPARTMENT_OTHER): Payer: Medicare HMO | Admitting: Hematology and Oncology

## 2017-04-22 ENCOUNTER — Encounter: Payer: Self-pay | Admitting: Hematology and Oncology

## 2017-04-22 DIAGNOSIS — Z170 Estrogen receptor positive status [ER+]: Secondary | ICD-10-CM

## 2017-04-22 DIAGNOSIS — I10 Essential (primary) hypertension: Secondary | ICD-10-CM | POA: Diagnosis not present

## 2017-04-22 DIAGNOSIS — C50412 Malignant neoplasm of upper-outer quadrant of left female breast: Secondary | ICD-10-CM | POA: Diagnosis not present

## 2017-04-22 MED ORDER — LETROZOLE 2.5 MG PO TABS: 2.5000 mg | ORAL_TABLET | Freq: Every day | ORAL | 3 refills | Status: DC

## 2017-04-22 NOTE — Progress Notes (Signed)
Patient Care Team: Gildardo Cranker, DO as PCP - General (Internal Medicine) Nicholas Lose, MD as Consulting Physician (Oncology) Erroll Luna, MD as Consulting Physician (General Surgery) Thea Silversmith, MD (Inactive) as Consulting Physician (Radiation Oncology)  DIAGNOSIS:  Encounter Diagnosis  Name Primary?  . Malignant neoplasm of upper-outer quadrant of left breast in female, estrogen receptor positive ([place])     SUMMARY OF ONCOLOGIC HISTORY:   Breast cancer of upper-outer quadrant of left female breast ([place])   03/31/2016 Initial Diagnosis    Screening detected left breast mass, 2 nodules, 1.9 x 1.6 x 0.8 cm= fat necrosis; 8 x 7 x 7 mm= grade 2 IDC ER 100%, PR 100%, HER-2 negative ratio 1.39, Ki-67 20%      04/17/2016 Surgery    Left lumpectomy ([name]): IDC grade 2, 1.3 cm, with DCIS, margins negative, 0/4 lymph nodes negative, T1 cN0 stage IA pathologic stage, ER 100%, PR 100%, HER-2 negative ratio 1.39, Ki-67 20% Oncotype DX score 20, 13% ROR, intermediate risk      04/17/2016 Oncotype testing    Recurrence score: 20; ROR 15% (intermediate risk)       05/27/2016 - 07/13/2016 Radiation Therapy    Adjuvant radiation therapy [facility]): Left breast treated with breath hold to 50.4 Gy in 28 fractions at 1.8 Gy/fraction.  Left breast boosted to 10 Gy in 5 fractions at 2 Gy/fraction      07/23/2016 -  Anti-estrogen oral therapy    Anastrozole 1 mg by mouth daily 5 years       CHIEF COMPLIANT: Follow-up on anastrozole therapy  INTERVAL HISTORY: Michele Page is a 61 year old with above-mentioned history of left breast cancer treated with lumpectomy and radiation is currently on anastrozole. She is complaining of profound hair loss on anastrozole. Otherwise she has been tolerating it very well. Denies any hot flashes or myalgias.  REVIEW OF SYSTEMS:   Constitutional: Denies fevers, chills or abnormal weight loss, complaining of hair loss Eyes: Denies blurriness of vision Ears,  nose, mouth, throat, and face: Denies mucositis or sore throat Respiratory: Denies cough, dyspnea or wheezes Cardiovascular: Denies palpitation, chest discomfort Gastrointestinal:  Denies nausea, heartburn or change in bowel habits Skin: Denies abnormal skin rashes Lymphatics: Denies new lymphadenopathy or easy bruising Neurological:Denies numbness, tingling or new weaknesses Behavioral/Psych: Mood is stable, no new changes  Extremities: No lower extremity edema Breast:  denies any pain or lumps or nodules in either breasts All other systems were reviewed with the patient and are negative.  I have reviewed the past medical history, past surgical history, social history and family history with the patient and they are unchanged from previous note.  ALLERGIES:  has No Known Allergies.  MEDICATIONS:  Current Outpatient Prescriptions  Medication Sig Dispense Refill  . anastrozole (ARIMIDEX) 1 MG tablet Take 1 tablet (1 mg total) by mouth daily. 90 tablet 3  . aspirin EC 81 MG tablet Take 1 tablet (81 mg total) by mouth daily. 30 tablet 6  . labetalol (NORMODYNE) 300 MG tablet TAKE TWO TABLETS BY MOUTH THREE TIMES DAILY 180 tablet 6  . NIFEdipine (PROCARDIA-XL/ADALAT CC) 60 MG 24 hr tablet Take 1 tablet (60 mg total) by mouth 2 (two) times daily. 60 tablet 1  . pravastatin (PRAVACHOL) 10 MG tablet Take 1 tablet (10 mg total) by mouth at bedtime. 90 tablet 1  . traMADol (ULTRAM) 50 MG tablet Take 1 tablet (50 mg total) by mouth every 8 (eight) hours as needed for severe pain. 30 tablet 0  No current facility-administered medications for this visit.     PHYSICAL EXAMINATION: ECOG PERFORMANCE STATUS: 1 - Symptomatic but completely ambulatory  Vitals:   04/22/17 0950  BP: 134/73  Pulse: 82  Resp: 17  Temp: 98.5 F (36.9 C)   Filed Weights   04/22/17 0950  Weight: 197 lb 4.8 oz (89.5 kg)    GENERAL:alert, no distress and comfortable SKIN: skin color, texture, turgor are normal,  no rashes or significant lesions EYES: normal, Conjunctiva are pink and non-injected, sclera clear OROPHARYNX:no exudate, no erythema and lips, buccal mucosa, and tongue normal  NECK: supple, thyroid normal size, non-tender, without nodularity LYMPH:  no palpable lymphadenopathy in the cervical, axillary or inguinal LUNGS: clear to auscultation and percussion with normal breathing effort HEART: regular rate & rhythm and no murmurs and no lower extremity edema ABDOMEN:abdomen soft, non-tender and normal bowel sounds MUSCULOSKELETAL:no cyanosis of digits and no clubbing  NEURO: alert & oriented x 3 with fluent speech, no focal motor/sensory deficits EXTREMITIES: No lower extremity edema   LABORATORY DATA:  I have reviewed the data as listed   Chemistry      Component Value Date/Time   NA 140 12/18/2016 0001   NA 141 04/08/2016 0904   K 3.9 12/18/2016 0001   K 4.7 04/08/2016 0904   CL 105 12/18/2016 0001   CO2 25 12/18/2016 0001   CO2 26 04/08/2016 0904   BUN 17 12/18/2016 0001   BUN 16.3 04/08/2016 0904   CREATININE 1.49 (H) 12/18/2016 0001   CREATININE 1.5 (H) 04/08/2016 0904      Component Value Date/Time   CALCIUM 9.2 12/18/2016 0001   CALCIUM 10.0 04/08/2016 0904   ALKPHOS 74 12/18/2016 0001   ALKPHOS 90 04/08/2016 0904   AST 19 12/18/2016 0001   AST 25 04/08/2016 0904   ALT 15 12/18/2016 0001   ALT 23 04/08/2016 0904   BILITOT 0.4 12/18/2016 0001   BILITOT 0.58 04/08/2016 0904       Lab Results  Component Value Date   WBC 4.9 04/08/2016   HGB 12.6 04/08/2016   HCT 38.0 04/08/2016   MCV 93.6 04/08/2016   PLT 271 04/08/2016   NEUTROABS 3.1 04/08/2016    ASSESSMENT & PLAN:  Breast cancer of upper-outer quadrant of left female breast ([place]) 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, 1.3 cm, with DCIS, margins negative, 0/4 lymph nodes negative, T1 cN0 stage IA pathologic stage, ER 100%, PR 100%, HER-2 negative ratio 1.39, Ki-67 20% (Originally 2 nodules were  detected on screening mammogram 1.9 cm fat necrosis and 8 mm IDC) Oncotype DX score 20, 13% risk of recurrence, intermediate risk Adjuvant radiation therapy started 07/20/2017completed 07/13/2016  Current treatment: Adjuvant antiestrogen therapy with anastrozole 1 mg daily 5 years started 07/23/16 Anastrozole toxicities: Profound hair loss: After much discussion we determined that we should switch her from anastrozole to letrozole. I sent a new prescription for letrozole.  Hypertension: Much better controlled Surveillance: 1. Breast exam 04/22/2017: Benign 2. Mammogram will need to be scheduled, patient has an appointment at [facility] coming up.  MRI brain: 02/27/2017: No acute intracranial abnormality, remote hemorrhage infarct left basal ganglia the lungs extensive periventricular and subcortical white matter disease nonspecific.  RTC 6 months for follow-up   I spent 25 minutes talking to the patient of which more than half was spent in counseling and coordination of care.  No orders of the defined types were placed in this encounter.  The patient has a good understanding of the  overall plan. she agrees with it. she will call with any problems that may develop before the next visit here.   Rulon Eisenmenger, MD 04/22/17

## 2017-04-22 NOTE — Assessment & Plan Note (Signed)
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, 1.3 cm, with DCIS, margins negative, 0/4 lymph nodes negative, T1 cN0 stage IA pathologic stage, ER 100%, PR 100%, HER-2 negative ratio 1.39, Ki-67 20% (Originally 2 nodules were detected on screening mammogram 1.9 cm= fat necrosis and 8 mm IDC) Oncotype DX score 20, 13% risk of recurrence, intermediate risk Adjuvant radiation therapy started 07/20/2017completed 07/13/2016  Current treatment: Adjuvant antiestrogen therapy with anastrozole 1 mg daily 5 years started 07/23/16 Anastrozole toxicities: Denies any side effects. She denies any hot flashes or myalgias.  Hypertension:  Surveillance: 1. Breast exam 04/22/2017: Benign 2. Mammogram will need to be scheduled  MRI brain: 02/27/2017: No acute intracranial abnormality, remote hemorrhage infarct left basal ganglia the lungs extensive periventricular and subcortical white matter disease nonspecific.  RTC one year

## 2017-04-29 ENCOUNTER — Other Ambulatory Visit: Payer: Self-pay | Admitting: Cardiovascular Disease

## 2017-05-10 ENCOUNTER — Other Ambulatory Visit: Payer: Self-pay | Admitting: *Deleted

## 2017-05-10 MED ORDER — PRAVASTATIN SODIUM 10 MG PO TABS: 10.0000 mg | ORAL_TABLET | Freq: Every day | ORAL | 1 refills | Status: DC

## 2017-05-10 NOTE — Telephone Encounter (Signed)
[facility]

## 2017-05-21 ENCOUNTER — Encounter: Payer: Self-pay | Admitting: Internal Medicine

## 2017-05-21 ENCOUNTER — Ambulatory Visit (INDEPENDENT_AMBULATORY_CARE_PROVIDER_SITE_OTHER): Payer: Medicare HMO | Admitting: Internal Medicine

## 2017-05-21 VITALS — BP 138/80 | HR 63 | Temp 98.5°F | Wt 194.0 lb

## 2017-05-21 DIAGNOSIS — E782 Mixed hyperlipidemia: Secondary | ICD-10-CM

## 2017-05-21 DIAGNOSIS — F09 Unspecified mental disorder due to known physiological condition: Secondary | ICD-10-CM

## 2017-05-21 DIAGNOSIS — H8110 Benign paroxysmal vertigo, unspecified ear: Secondary | ICD-10-CM | POA: Diagnosis not present

## 2017-05-21 DIAGNOSIS — I10 Essential (primary) hypertension: Secondary | ICD-10-CM | POA: Diagnosis not present

## 2017-05-21 DIAGNOSIS — Z853 Personal history of malignant neoplasm of breast: Secondary | ICD-10-CM

## 2017-05-21 DIAGNOSIS — G35 Multiple sclerosis: Secondary | ICD-10-CM

## 2017-05-21 DIAGNOSIS — L659 Nonscarring hair loss, unspecified: Secondary | ICD-10-CM

## 2017-05-21 DIAGNOSIS — M25462 Effusion, left knee: Secondary | ICD-10-CM | POA: Diagnosis not present

## 2017-05-21 DIAGNOSIS — I6930 Unspecified sequelae of cerebral infarction: Secondary | ICD-10-CM | POA: Diagnosis not present

## 2017-05-21 DIAGNOSIS — M25562 Pain in left knee: Secondary | ICD-10-CM | POA: Diagnosis not present

## 2017-05-21 NOTE — Patient Instructions (Addendum)
Will call with referral for Ortho for 2nd opinion  Declined lidoderm patch  Not candidate for NSAID due to reduced kidney function  Follow up in 1-2 weeks for fasting labs  Follow up in 3 mos for HTN, knee pain, hair loss

## 2017-05-21 NOTE — Progress Notes (Signed)
Location:  [facility] Place of Service: OFFICE  Chief Complaint  Patient presents with  . Medical Management of Chronic Issues    31mh follow-up, hair loss, bruising, left leg swelling    HPI:  61yo female seen today for f/u. She c/o left knee pain/leg swelling, hair loss and bruising when she bumps objects. She has intermittent pain in left back and dizziness when she gets up too fast. She is a poor historian due to memory loss. Hx obtained from chart  Left knee pain - uncontrolled. She rec'd knee injection by Ortho that she states made her pain worse and she has gait dysfunction due to knee. She no longer takes any medication for it. She would like a 2nd opinion. Knee is swollen and stiff and she notes swelling distally. MRI done in March 2018 revealed large joint effusion and tricompartmental cartilage abnormalities, and lateral femoral condyle friction syndrome.   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 She completed XRT about 2 weeks ago. She was told no chemotx needed. She has trouble falling asleep since dx.  Nothing tried OTC. TCharolette Forwardin the past but felt like it was ineffective. Started arimidex but was changed to femara in 04/2017 due to hair loss. Followed by Dr GLindi Adie HTN - stable on labetalol and procardia. BP at home 130s/70-80s. She does take meds at same time every day. Followed by cardio Dr BGwenlyn Found She takes ASA daily  Hyperlipidemia - stable on pravastatin. LDL 84  COPD - no exacerbations. No issues with breathing  Hx CVA - no new sx's. Takes statin and ASA daily. LDL 84. Followed by Dr SFelecia Shelling MS - no obvious sx's - no diplopia but she cannot "stare" for prolonged time without blinking. Followed by Neurology Dr SFelecia Shelling No exacerbations  Gastritis/GERD - she stopped her nexium on her own.  She had a colonoscopy last yr and tubular adenoma polyps removed. No acid reflux sx's.  CKD - probably due to HTN. Cr  stable at 1.49   Past Medical History:  Diagnosis Date  . Arthritis    lt knee  . Breast cancer ([place]   . Breast cancer of upper-outer quadrant of left female breast ([place] 04/02/2016  . Colon polyps   . COPD (chronic obstructive pulmonary disease) ([place]   . Hyperlipidemia   . Hypertension   . Multiple sclerosis ([place]   . Stroke ([place]    2013, mild cognitive deficits  . Vision abnormalities     Past Surgical History:  Procedure Laterality Date  . HYSTEROSCOPY W/D&C N/A 12/19/2015   Procedure: DILATATION AND CURETTAGE /HYSTEROSCOPY;  Surgeon: MLinda Hedges DO;  [facility];  Service: Gynecology;  Laterality: N/A;  . RADIOACTIVE SEED GUIDED MASTECTOMY WITH AXILLARY SENTINEL LYMPH NODE BIOPSY Left 04/17/2016   Procedure: RADIOACTIVE SEED GUIDED PARTIAL MASTECTOMY WITH AXILLARY SENTINEL LYMPH NODE BIOPSY;  Surgeon: TErroll Luna MD;  [facility]  Service: General;  Laterality: Left;  RADIOACTIVE SEED GUIDED PARTIAL MASTECTOMY WITH AXILLARY SENTINEL LYMPH NODE BIOPSY   . TUBAL LIGATION    . VAGINAL DELIVERY     x3    Patient Care Team: CGildardo Cranker DO as PCP - General (Internal Medicine) GNicholas Lose MD as Consulting Physician (Oncology) CErroll Luna MD as Consulting Physician (General Surgery) WThea Silversmith MD (Inactive) as Consulting Physician (Radiation Oncology)  Social History   Social History  . Marital status: Married  Spouse name: N/A  . Number of children: 3  . Years of education: N/A   Occupational History  . retired    Social History Main Topics  . Smoking status: Former Smoker    Packs/day: 0.50    Years: 25.00    Types: Cigarettes    Quit date: 05/21/2002  . Smokeless tobacco: Never Used  . Alcohol use 0.0 oz/week     Comment: occas  . Drug use: No  . Sexual activity: No   Other Topics Concern  . Not on file   Social History Narrative   Diet- N/A   Caffeine- Yes   Married- Yes   House- 2 story with 2  people   Pets- No   Current/past profession- Nurse, mental health   Exercise- Yes   Living will-No   DNR-N/A   POA/HPOA-No           reports that she quit smoking about 15 years ago. Her smoking use included Cigarettes. She has a 12.50 pack-year smoking history. She has never used smokeless tobacco. She reports that she drinks alcohol. She reports that she does not use drugs.  Family History  Problem Relation Age of Onset  . Stroke Sister   . Hypertension Mother   . Stroke Mother   . Alcohol abuse Brother   . HIV/AIDS Brother    Family Status  Relation Status  . Sister Deceased  . Father Deceased at age 20  . Mother Deceased at age 42  . Brother Deceased  . Brother Deceased  . Brother Deceased     No Known Allergies  Medications: Patient's Medications  New Prescriptions   No medications on file  Previous Medications   ASPIRIN EC 81 MG TABLET    Take 1 tablet (81 mg total) by mouth daily.   LABETALOL (NORMODYNE) 300 MG TABLET    TAKE TWO TABLETS BY MOUTH THREE TIMES DAILY   LETROZOLE (FEMARA) 2.5 MG TABLET    Take 1 tablet (2.5 mg total) by mouth daily.   NIFEDIPINE (PROCARDIA-XL/ADALAT CC) 60 MG 24 HR TABLET    TAKE 1 TABLET BY MOUTH TWICE DAILY   PRAVASTATIN (PRAVACHOL) 10 MG TABLET    Take 1 tablet (10 mg total) by mouth at bedtime.  Modified Medications   No medications on file  Discontinued Medications   No medications on file    Review of Systems  Unable to perform ROS: Other (memory loss)    Vitals:   05/21/17 1054  BP: 138/80  Pulse: 63  Temp: 98.5 F (36.9 C)  TempSrc: Oral  SpO2: 97%  Weight: 194 lb (88 kg)   Body mass index is 34.37 kg/m.  Physical Exam  Constitutional: She appears well-developed and well-nourished.  HENT:  Mouth/Throat: Oropharynx is clear and moist. No oropharyngeal exudate.  Eyes: Pupils are equal, round, and reactive to light. No scleral icterus.  Neck: Neck supple. Carotid bruit is not present. No tracheal  deviation present. No thyromegaly present.  Cardiovascular: Normal rate, regular rhythm and intact distal pulses.  Exam reveals no gallop and no friction rub.   Murmur (1/6) heard. No LE edema b/l. no calf TTP  Pulmonary/Chest: Effort normal and breath sounds normal. No stridor. No respiratory distress. She has no wheezes. She has no rales. Right breast exhibits no inverted nipple, no mass, no nipple discharge, no skin change and no tenderness. Left breast exhibits no inverted nipple, no mass, no nipple discharge, no skin change and no tenderness. Breasts are asymmetrical.  Abdominal: Soft. Bowel sounds are normal. She exhibits no distension and no mass. There is no hepatomegaly. There is no tenderness. There is no rebound and no guarding.  Musculoskeletal: She exhibits edema and tenderness.  Left knee swelling with effusion; lateral knee fluid collection, TTP with reduced flexion  Lymphadenopathy:    She has no cervical adenopathy.  Neurological: She is alert.  (+) right Dix Hallpike maneuver  Skin: Skin is warm and dry. No rash noted.  Psychiatric: She has a normal mood and affect. Her behavior is normal. Her speech is slurred.     Labs reviewed: Hospital Outpatient Visit on 03/10/2017  Component Date Value Ref Range Status  . Right super femoral prox sys PSV 03/10/2017 -100  cm/s Final  . Right super femoral mid sys PSV 03/10/2017 -79  cm/s Final  . Right super femoral dist sys PSV 03/10/2017 -126  cm/s Final  . RIGHT ANT DIST TIBAL SYS PSV 03/10/2017 48  cm/s Final  . RIGHT POST TIB DIST SYS 03/10/2017 75  cm/s Final    No results found.   Assessment/Plan    ICD-10-CM   1. Benign paroxysmal positional vertigo, unspecified laterality H81.10 PT vestibular rehab   right  2. Pain and swelling of left knee M25.562 Ambulatory referral to Orthopedic Surgery   M25.462    due to artthritic changes; joint effusion  3. Essential hypertension I10 Urinalysis with Reflex Microscopic  4.  History of breast cancer Z85.3 CMP with eGFR    CBC with Differential/Platelets  5. Multiple sclerosis ([place]) G35 CBC with Differential/Platelets  6. History of stroke with residual deficit I69.30   7. Mixed hyperlipidemia E78.2 Lipid Panel    TSH  8. Cognitive disorder F09   9. Hair loss L65.9    Will call with referral for Ortho for 2nd opinion  Declined lidoderm patch  Change positions slowly when getting up from bed or chair  Not candidate for NSAID due to reduced kidney function  Follow up in 1-2 weeks for fasting labs  Follow up in 3 mos for HTN, knee pain, hair loss   Michele Page S. Perlie Gold  [facility] [address] [place], [place] [address] [phone] Cell (Monday-Friday 8 AM - 5 PM) [phone] After 5 PM and follow prompts

## 2017-05-26 ENCOUNTER — Other Ambulatory Visit: Payer: Medicare HMO

## 2017-05-26 ENCOUNTER — Other Ambulatory Visit: Payer: Self-pay

## 2017-05-26 DIAGNOSIS — E782 Mixed hyperlipidemia: Secondary | ICD-10-CM | POA: Diagnosis not present

## 2017-05-26 DIAGNOSIS — Z853 Personal history of malignant neoplasm of breast: Secondary | ICD-10-CM | POA: Diagnosis not present

## 2017-05-26 DIAGNOSIS — I10 Essential (primary) hypertension: Secondary | ICD-10-CM | POA: Diagnosis not present

## 2017-05-26 DIAGNOSIS — G35 Multiple sclerosis: Secondary | ICD-10-CM | POA: Diagnosis not present

## 2017-05-26 DIAGNOSIS — H8110 Benign paroxysmal vertigo, unspecified ear: Secondary | ICD-10-CM

## 2017-05-26 LAB — CBC WITH DIFFERENTIAL/PLATELET
BASOS ABS: 42 {cells}/uL (ref 0–200)
Basophils Relative: 1 %
Eosinophils Absolute: 84 cells/uL (ref 15–500)
Eosinophils Relative: 2 %
HCT: 37.5 % (ref 35.0–45.0)
HEMOGLOBIN: 12.4 g/dL (ref 11.7–15.5)
LYMPHS ABS: 1302 {cells}/uL (ref 850–3900)
Lymphocytes Relative: 31 %
MCH: 30.8 pg (ref 27.0–33.0)
MCHC: 33.1 g/dL (ref 32.0–36.0)
MCV: 93.3 fL (ref 80.0–100.0)
MONOS PCT: 5 %
MPV: 10 fL (ref 7.5–12.5)
Monocytes Absolute: 210 cells/uL (ref 200–950)
NEUTROS ABS: 2562 {cells}/uL (ref 1500–7800)
Neutrophils Relative %: 61 %
PLATELETS: 309 10*3/uL (ref 140–400)
RBC: 4.02 MIL/uL (ref 3.80–5.10)
RDW: 13.5 % (ref 11.0–15.0)
WBC: 4.2 10*3/uL (ref 3.8–10.8)

## 2017-05-26 LAB — LIPID PANEL
Cholesterol: 175 mg/dL (ref <200)
HDL: 65 mg/dL (ref >50)
LDL CALC: 88 mg/dL (ref <100)
Total CHOL/HDL Ratio: 2.7 Ratio (ref <5.0)
Triglycerides: 108 mg/dL (ref <150)
VLDL: 22 mg/dL (ref <30)

## 2017-05-26 LAB — COMPLETE METABOLIC PANEL WITH GFR
ALT: 19 U/L (ref 6–29)
AST: 23 U/L (ref 10–35)
Albumin: 4.6 g/dL (ref 3.6–5.1)
Alkaline Phosphatase: 81 U/L (ref 33–130)
BUN: 14 mg/dL (ref 7–25)
CHLORIDE: 103 mmol/L (ref 98–110)
CO2: 25 mmol/L (ref 20–31)
CREATININE: 1.29 mg/dL — AB (ref 0.50–0.99)
Calcium: 9.7 mg/dL (ref 8.6–10.4)
GFR, EST AFRICAN AMERICAN: 52 mL/min — AB (ref >=60)
GFR, Est Non African American: 45 mL/min — ABNORMAL LOW (ref >=60)
GLUCOSE: 94 mg/dL (ref 65–99)
Potassium: 3.6 mmol/L (ref 3.5–5.3)
Sodium: 140 mmol/L (ref 135–146)
Total Bilirubin: 0.5 mg/dL (ref 0.2–1.2)
Total Protein: 6.9 g/dL (ref 6.1–8.1)

## 2017-05-26 LAB — TSH: TSH: 1.37 m[IU]/L

## 2017-05-27 LAB — URINALYSIS, MICROSCOPIC ONLY
BACTERIA UA: NONE SEEN [HPF]
CRYSTALS: NONE SEEN [HPF]
Casts: NONE SEEN [LPF]
RBC / HPF: NONE SEEN RBC/HPF (ref <=2)
Yeast: NONE SEEN [HPF]

## 2017-05-27 LAB — URINALYSIS, ROUTINE W REFLEX MICROSCOPIC
Bilirubin Urine: NEGATIVE
GLUCOSE, UA: NEGATIVE
HGB URINE DIPSTICK: NEGATIVE
Ketones, ur: NEGATIVE
Nitrite: NEGATIVE
PROTEIN: NEGATIVE
Specific Gravity, Urine: 1.015 (ref 1.001–1.035)
pH: 6 (ref 5.0–8.0)

## 2017-05-28 ENCOUNTER — Ambulatory Visit (INDEPENDENT_AMBULATORY_CARE_PROVIDER_SITE_OTHER): Payer: Medicare HMO

## 2017-05-28 ENCOUNTER — Encounter (INDEPENDENT_AMBULATORY_CARE_PROVIDER_SITE_OTHER): Payer: Self-pay | Admitting: Orthopaedic Surgery

## 2017-05-28 ENCOUNTER — Ambulatory Visit (INDEPENDENT_AMBULATORY_CARE_PROVIDER_SITE_OTHER): Payer: Medicare HMO | Admitting: Orthopaedic Surgery

## 2017-05-28 DIAGNOSIS — M1712 Unilateral primary osteoarthritis, left knee: Secondary | ICD-10-CM | POA: Diagnosis not present

## 2017-05-28 DIAGNOSIS — M25562 Pain in left knee: Secondary | ICD-10-CM | POA: Diagnosis not present

## 2017-05-28 DIAGNOSIS — G8929 Other chronic pain: Secondary | ICD-10-CM | POA: Diagnosis not present

## 2017-05-28 MED ORDER — METHYLPREDNISOLONE ACETATE 40 MG/ML IJ SUSP
40.0000 mg | INTRAMUSCULAR | Status: AC | PRN
  Administered 2017-05-28: 40 mg via INTRA_ARTICULAR

## 2017-05-28 MED ORDER — LIDOCAINE HCL 1 % IJ SOLN
2.0000 mL | INTRAMUSCULAR | Status: AC | PRN
  Administered 2017-05-28: 2 mL

## 2017-05-28 MED ORDER — BUPIVACAINE HCL 0.5 % IJ SOLN
2.0000 mL | INTRAMUSCULAR | Status: AC | PRN
  Administered 2017-05-28: 2 mL via INTRA_ARTICULAR

## 2017-05-28 NOTE — Progress Notes (Signed)
Office Visit Note   Patient: Michele Page           Date of Birth: 07/13/1956           MRN: [id number] Visit Date: 05/28/2017              Requested by: Michele Page, [place], [place] [id number] PCP: Michele Cranker, DO   Assessment & Plan: Visit Diagnoses:  1. Primary osteoarthritis of left knee     Plan: Overall impression is left knee tricompartmental degenerative joint disease. I aspirated approximately 15 mL of synovial fluid and then injected cortisone today. Patient tolerated this well. Fluid was sent off to the lab for analysis. I gave her a pamphlet for monovisc.  We'll let her know about the results of her knee aspiration  Follow-Up Instructions: No Follow-up on file.   Orders:  Orders Placed This Encounter  Procedures  . XR KNEE 3 VIEW LEFT  . Cell count + diff,  w/ cryst-synvl fld   No orders of the defined types were placed in this encounter.     Procedures: Large Joint Inj Date/Time: 05/28/2017 4:16 PM Performed by: Michele Page Authorized by: Michele Page   Consent Given by:  Patient Timeout: prior to procedure the correct patient, procedure, and site was verified   Indications:  Pain Location:  Knee Site:  L knee Prep: patient was prepped and draped in usual sterile fashion   Needle Size:  22 G Ultrasound Guidance: No   Fluoroscopic Guidance: No   Arthrogram: No   Medications:  2 mL lidocaine 1 %; 2 mL bupivacaine 0.5 %; 40 mg methylPREDNISolone acetate 40 MG/ML Aspirate amount (mL):  15 Patient tolerance:  Patient tolerated the procedure well with no immediate complications     Clinical Data: No additional findings.   Subjective: Chief Complaint  Patient presents with  . Left Knee - Pain    Michele Page is a pleasant 61 year old female who comes in today for left knee pain throughout her knee with radiation into the popliteal fossa. She feels that her left knee may be getting better. She does still complain of a persistent  effusion. She saw Michele Page 4 months ago who provided her with a cortisone injection. She also had MRI in March which showed tricompartmental osteoarthritis. She denies any significant mechanical symptoms. Denies any numbness or tingling. Doppler was negative for DVT.    Review of Systems  Constitutional: Negative.   HENT: Negative.   Eyes: Negative.   Respiratory: Negative.   Cardiovascular: Negative.   Endocrine: Negative.   Musculoskeletal: Negative.   Neurological: Negative.   Hematological: Negative.   Psychiatric/Behavioral: Negative.   All other systems reviewed and are negative.    Objective: Vital Signs: There were no vitals taken for this visit.  Physical Exam  Constitutional: She is oriented to person, place, and time. She appears well-developed and well-nourished.  HENT:  Head: Normocephalic and atraumatic.  Eyes: EOM are normal.  Neck: Neck supple.  Pulmonary/Chest: Effort normal.  Abdominal: Soft.  Neurological: She is alert and oriented to person, place, and time.  Skin: Skin is warm. Capillary refill takes less than 2 seconds.  Psychiatric: She has a normal mood and affect. Her behavior is normal. Judgment and thought content normal.  Nursing note and vitals reviewed.   Ortho Exam Left knee exam shows a moderate effusion. There are no skin changes. Range of motion is normal. Mild joint line tenderness. Collaterals and cruciates  are stable. Specialty Comments:  No specialty comments available.  Imaging: Xr Knee 3 View Left  Result Date: 05/28/2017 Moderate left knee    PMFS History: Patient Active Problem List   Diagnosis Date Noted  . Breast cancer of upper-outer quadrant of left female breast ([place]) 04/02/2016  . Essential hypertension 01/10/2016  . 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 Gastroesophageal reflux disease without esophagitis 01/10/2016  . Hyperlipidemia 01/10/2016  . CKD (chronic kidney  disease) 01/10/2016  . Multiple sclerosis ([place]) 03/20/2015  . Accelerated hypertension 03/20/2015  . CVA (cerebral infarction) 03/20/2015  . Hemiplegia following CVA (cerebrovascular accident) ([place]) 03/20/2015  . Cognitive disorder 03/20/2015  . Nocturia 03/20/2015  . Snoring 03/20/2015  . Aphasia S/P CVA 03/20/2015  . 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 Arthritis    lt knee  . Breast cancer ([place])   . Breast cancer of upper-outer quadrant of left female breast ([place]) 04/02/2016  . Colon polyps   . COPD (chronic obstructive pulmonary disease) ([place])   . Hyperlipidemia   . Hypertension   . Multiple sclerosis ([place])   . Stroke ([place])    2013, mild cognitive deficits  . Vision abnormalities     Family History  Problem Relation Age of Onset  . Stroke Sister   . Hypertension Mother   . Stroke Mother   . Alcohol abuse Brother   . HIV/AIDS Brother     Past Surgical History:  Procedure Laterality Date  . HYSTEROSCOPY W/D&C N/A 12/19/2015   Procedure: DILATATION AND CURETTAGE /HYSTEROSCOPY;  Surgeon: Michele Hedges, DO;  Location: [facility];  Service: Gynecology;  Laterality: N/A;  . RADIOACTIVE SEED GUIDED MASTECTOMY WITH AXILLARY SENTINEL LYMPH NODE BIOPSY Left 04/17/2016   Procedure: RADIOACTIVE SEED GUIDED PARTIAL MASTECTOMY WITH AXILLARY SENTINEL LYMPH NODE BIOPSY;  Surgeon: Michele Luna, MD;  Location: [place];  Service: General;  Laterality: Left;  RADIOACTIVE SEED GUIDED PARTIAL MASTECTOMY WITH AXILLARY SENTINEL LYMPH NODE BIOPSY   . TUBAL LIGATION    . VAGINAL DELIVERY     x3   Social History   Occupational History  . retired    Social History Main Topics  . Smoking status: Former Smoker    Packs/day: 0.50    Years: 25.00    Types: Cigarettes    Quit date: 05/21/2002  . Smokeless tobacco: Never Used  . Alcohol use 0.0 oz/week     Comment: occas  . Drug use: No  . Sexual activity: No

## 2017-05-29 LAB — SYNOVIAL CELL COUNT + DIFF, W/ CRYSTALS
Basophils, %: 0 %
EOSINOPHILS-SYNOVIAL: 0 % (ref 0–2)
Lymphocytes-Synovial Fld: 54 % (ref 0–74)
Monocyte/Macrophage: 38 % (ref 0–69)
NEUTROPHIL, SYNOVIAL: 7 % (ref 0–24)
SYNOVIOCYTES, %: 1 % (ref 0–15)
WBC, Synovial: 249 cells/uL — ABNORMAL HIGH (ref <150)

## 2017-05-30 NOTE — Progress Notes (Signed)
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

## 2017-05-31 ENCOUNTER — Telehealth (INDEPENDENT_AMBULATORY_CARE_PROVIDER_SITE_OTHER): Payer: Self-pay

## 2017-05-31 NOTE — Progress Notes (Signed)
I called and explained everything to her.

## 2017-06-15 ENCOUNTER — Ambulatory Visit: Payer: Medicare HMO | Attending: Internal Medicine | Admitting: Physical Therapy

## 2017-06-15 DIAGNOSIS — H8111 Benign paroxysmal vertigo, right ear: Secondary | ICD-10-CM | POA: Insufficient documentation

## 2017-06-15 NOTE — Therapy (Signed)
[facility] [address] [place], [place], [address] Phone: [phone]   Fax:  [phone]  Physical Therapy Evaluation  Patient Details  Name: Michele Page MRN: [id number] Date of Birth: 1956-07-02 Referring Provider: Gildardo Cranker, DO  Encounter Date: 06/15/2017      PT End of Session - 06/15/17 2117    Visit Number 1   Authorization Type [employer]   PT Start Time 1019   PT Stop Time 1102   PT Time Calculation (min) 43 min      Past Medical History:  Diagnosis Date  . Arthritis    lt knee  . Breast cancer ([place])   . Breast cancer of upper-outer quadrant of left female breast ([place]) 04/02/2016  . Colon polyps   . COPD (chronic obstructive pulmonary disease) ([place])   . Hyperlipidemia   . Hypertension   . Multiple sclerosis ([place])   . Stroke ([place])    2013, mild cognitive deficits  . Vision abnormalities     Past Surgical History:  Procedure Laterality Date  . HYSTEROSCOPY W/D&C N/A 12/19/2015   Procedure: DILATATION AND CURETTAGE /HYSTEROSCOPY;  Surgeon:  Hedges, DO;  Location: [facility];  Service: Gynecology;  Laterality: N/A;  . RADIOACTIVE SEED GUIDED MASTECTOMY WITH AXILLARY SENTINEL LYMPH NODE BIOPSY Left 04/17/2016   Procedure: RADIOACTIVE SEED GUIDED PARTIAL MASTECTOMY WITH AXILLARY SENTINEL LYMPH NODE BIOPSY;  Surgeon: Erroll Luna, MD;  Location: [facility];  Service: General;  Laterality: Left;  RADIOACTIVE SEED GUIDED PARTIAL MASTECTOMY WITH AXILLARY SENTINEL LYMPH NODE BIOPSY   . TUBAL LIGATION    . VAGINAL DELIVERY     x3    There were no vitals filed for this visit.       Subjective Assessment - 06/15/17 2107    Subjective Pt reports dizziness started about 4 months ago - has been getting better but is scared it will come back; hasn't felt it within past month: asks "how do I know that it won't come back"; states it lasted about 3 months    Patient Stated Goals "I don't  know - the vertigo is fine now"   Currently in Pain? No/denies            [facility] PT Assessment - 06/15/17 1022      Assessment   Medical Diagnosis BPPV   Referring Provider Gildardo Cranker, DO   Onset Date/Surgical Date --  April 2018     Balance Screen   Has the patient fallen in the past 6 months No   Has the patient had a decrease in activity level because of a fear of falling?  No   Is the patient reluctant to leave their home because of a fear of falling?  No     Prior Function   Level of Independence Independent            Vestibular Assessment - 06/15/17 0001      Vestibular Assessment   General Observation Pt is a 61 year old female with onset of vertigo about 3- 4 months ago which she states has now resolved.  Reports she has not had any dizziness within past month - states she started to cancel today's appt but thought she ought to come to learn what to do shoudl it re-occur     Symptom Behavior   Type of Dizziness Not applicable   Frequency of Dizziness none   Duration of Dizziness None at this time   Aggravating Factors Not  applicable   Relieving Factors Not applicable     Occulomotor Exam   Occulomotor Alignment Normal     Positional Testing   Dix-Hallpike Dix-Hallpike Right;Dix-Hallpike Left   Sidelying Test Sidelying Right;Sidelying Left     Dix-Hallpike Right   Dix-Hallpike Right Duration none   Dix-Hallpike Right Symptoms No nystagmus     Dix-Hallpike Left   Dix-Hallpike Left Duration none   Dix-Hallpike Left Symptoms No nystagmus     Sidelying Right   Sidelying Right Duration none   Sidelying Right Symptoms No nystagmus     Sidelying Left   Sidelying Left Duration none   Sidelying Left Symptoms No nystagmus        Objective measurements completed on examination: See above findings.                  PT Education - 06/15/17 03/11/2115    Education provided Yes   Education Details Brandt-Daroff exercises:  etiology of BPPV    Person(s) Educated Patient   Methods Explanation;Demonstration;Handout   Comprehension Verbalized understanding;Returned demonstration                     Plan - 06/15/17 03/10/2117    Clinical Impression Statement Pt is a 61 yr old female with symptoms consistent with Rt BPPV with onset approx. 3-4 months ago which has now resolved.  No nystagmus or c/o vertigo with any positional testing at this time - pt states she has not had any vertigo for past month.     Clinical Presentation Stable   Clinical Decision Making Low   Rehab Potential Good   PT Frequency One time visit   PT Treatment/Interventions Patient/family education;ADLs/Self Care Home Management;Vestibular;Canalith Repostioning   PT Next Visit Plan N/A - pt has no vertigo at this time - will place on HOLD x 30 days - pt to call for appt prn    Consulted and Agree with Plan of Care Patient      Patient will benefit from skilled therapeutic intervention in order to improve the following deficits and impairments:  Dizziness  Visit Diagnosis: BPPV (benign paroxysmal positional vertigo), right - Plan: PT plan of care cert/re-cert      G-Codes - 60/63/01 March 10, 2122    Functional Assessment Tool Used (Outpatient Only) no c/o vertigo at this time   Functional Limitation Changing and maintaining body position   Changing and Maintaining Body Position Current Status (S0109) 0 percent impaired, limited or restricted   Changing and Maintaining Body Position Goal Status (N2355) 0 percent impaired, limited or restricted   Changing and Maintaining Body Position Discharge Status (D3220) 0 percent impaired, limited or restricted       Problem List Patient Active Problem List   Diagnosis Date Noted  . Breast cancer of upper-outer quadrant of left female breast ([place]) 04/02/2016  . Essential hypertension 01/10/2016  . 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 Gastroesophageal reflux disease  without esophagitis 01/10/2016  . Hyperlipidemia 01/10/2016  . CKD (chronic kidney disease) 01/10/2016  . Multiple sclerosis ([place]) 03/20/2015  . Accelerated hypertension 03/20/2015  . CVA (cerebral infarction) 03/20/2015  . 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 Cognitive disorder 03/20/2015  . Nocturia 03/20/2015  . Snoring 03/20/2015  . Aphasia S/P CVA 03/20/2015  . 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  [place], [place], [address] Phone: [phone]   Fax:  [phone]  Name: Michele Page MRN: [id number] Date of Birth: 1956/11/14

## 2017-06-15 NOTE — Patient Instructions (Addendum)
Benign Positional Vertigo Vertigo is the feeling that you or your surroundings are moving when they are not. Benign positional vertigo is the most common form of vertigo. The cause of this condition is not serious (is benign). This condition is triggered by certain movements and positions (is positional). This condition can be dangerous if it occurs while you are doing something that could endanger you or others, such as driving. What are the causes? In many cases, the cause of this condition is not known. It may be caused by a disturbance in an area of the inner ear that helps your brain to sense movement and balance. This disturbance can be caused by a viral infection (labyrinthitis), head injury, or repetitive motion. What increases the risk? This condition is more likely to develop in:  Women.  People who are 50 years of age or older.  What are the signs or symptoms? Symptoms of this condition usually happen when you move your head or your eyes in different directions. Symptoms may start suddenly, and they usually last for less than a minute. Symptoms may include:  Loss of balance and falling.  Feeling like you are spinning or moving.  Feeling like your surroundings are spinning or moving.  Nausea and vomiting.  Blurred vision.  Dizziness.  Involuntary eye movement (nystagmus).  Symptoms can be mild and cause only slight annoyance, or they can be severe and interfere with daily life. Episodes of benign positional vertigo may return (recur) over time, and they may be triggered by certain movements. Symptoms may improve over time. How is this diagnosed? This condition is usually diagnosed by medical history and a physical exam of the head, neck, and ears. You may be referred to a health care provider who specializes in ear, nose, and throat (ENT) problems (otolaryngologist) or a provider who specializes in disorders of the nervous system (neurologist). You may have additional testing,  including:  MRI.  A CT scan.  Eye movement tests. Your health care provider may ask you to change positions quickly while he or she watches you for symptoms of benign positional vertigo, such as nystagmus. Eye movement may be tested with an electronystagmogram (ENG), caloric stimulation, the Dix-Hallpike test, or the roll test.  An electroencephalogram (EEG). This records electrical activity in your brain.  Hearing tests.  How is this treated? Usually, your health care provider will treat this by moving your head in specific positions to adjust your inner ear back to normal. Surgery may be needed in severe cases, but this is rare. In some cases, benign positional vertigo may resolve on its own in 2-4 weeks. Follow these instructions at home: Safety  Move slowly.Avoid sudden body or head movements.  Avoid driving.  Avoid operating heavy machinery.  Avoid doing any tasks that would be dangerous to you or others if a vertigo episode would occur.  If you have trouble walking or keeping your balance, try using a cane for stability. If you feel dizzy or unstable, sit down right away.  Return to your normal activities as told by your health care provider. Ask your health care provider what activities are safe for you. General instructions  Take over-the-counter and prescription medicines only as told by your health care provider.  Avoid certain positions or movements as told by your health care provider.  Drink enough fluid to keep your urine clear or pale yellow.  Keep all follow-up visits as told by your health care provider. This is important. Contact a health care   provider if:  You have a fever.  Your condition gets worse or you develop new symptoms.  Your family or friends notice any behavioral changes.  Your nausea or vomiting gets worse.  You have numbness or a "pins and needles" sensation. Get help right away if:  You have difficulty speaking or moving.  You are  always dizzy.  You faint.  You develop severe headaches.  You have weakness in your legs or arms.  You have changes in your hearing or vision.  You develop a stiff neck.  You develop sensitivity to light. This information is not intended to replace advice given to you by your health care provider. Make sure you discuss any questions you have with your health care provider. Document Released: 08/10/2006 Document Revised: 04/09/2016 Document Reviewed: 02/25/2015 Elsevier Interactive Patient Education  2018 [place] to Side-Lying    Sit on edge of bed. 1. Turn head 45 to right. 2. Maintain head position and lie down slowly on left side. Hold until symptoms subside. 3. Sit up slowly. Hold until symptoms subside. 4. Turn head 45 to left. 5. Maintain head position and lie down slowly on right side. Hold until symptoms subside. 6. Sit up slowly. Repeat sequence __5__ times per session. Do __3-5__ sessions per day.  Copyright  VHI. All rights reserved.

## 2017-06-28 ENCOUNTER — Other Ambulatory Visit: Payer: Self-pay | Admitting: Cardiovascular Disease

## 2017-07-13 ENCOUNTER — Ambulatory Visit (INDEPENDENT_AMBULATORY_CARE_PROVIDER_SITE_OTHER): Payer: Medicare HMO | Admitting: Internal Medicine

## 2017-07-13 ENCOUNTER — Encounter: Payer: Self-pay | Admitting: Internal Medicine

## 2017-07-13 VITALS — BP 138/90 | HR 80 | Temp 98.7°F | Ht 63.0 in | Wt 197.0 lb

## 2017-07-13 DIAGNOSIS — Z111 Encounter for screening for respiratory tuberculosis: Secondary | ICD-10-CM

## 2017-07-13 DIAGNOSIS — Z0289 Encounter for other administrative examinations: Secondary | ICD-10-CM | POA: Diagnosis not present

## 2017-07-13 NOTE — Patient Instructions (Addendum)
Follow up in 2 days to have TB skin test read - needs nurse room visit  Form completed today  Follow up as scheduled

## 2017-07-13 NOTE — Progress Notes (Signed)
Patient ID: Michele Page, female   DOB: Dec 15, 1955, 61 y.o.   MRN: [id number]    Location:  [facility] Place of Service: OFFICE  Chief Complaint  Patient presents with  . 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 Acute Visit    Discuss trouble falling asleep     HPI:  61 yo female seen today for form completion. 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 She has a hx CVA. Vertigo has resolved after vestibular tx.  She continues to have trouble sleeping.   Past Medical History:  Diagnosis Date  . Arthritis    lt knee  . Breast cancer ([place])   . Breast cancer of upper-outer quadrant of left female breast ([place]) 04/02/2016  . Colon polyps   . COPD (chronic obstructive pulmonary disease) ([place])   . Hyperlipidemia   . Hypertension   . Multiple sclerosis ([place])   . Stroke ([place])    2013, mild cognitive deficits  . Vision abnormalities     Past Surgical History:  Procedure Laterality Date  . HYSTEROSCOPY W/D&C N/A 12/19/2015   Procedure: DILATATION AND CURETTAGE /HYSTEROSCOPY;  Surgeon: Linda Hedges, DO;  Location: [facility];  Service: Gynecology;  Laterality: N/A;  . RADIOACTIVE SEED GUIDED MASTECTOMY WITH AXILLARY SENTINEL LYMPH NODE BIOPSY Left 04/17/2016   Procedure: RADIOACTIVE SEED GUIDED PARTIAL MASTECTOMY WITH AXILLARY SENTINEL LYMPH NODE BIOPSY;  Surgeon: Erroll Luna, MD;  Location: [facility];  Service: General;  Laterality: Left;  RADIOACTIVE SEED GUIDED PARTIAL MASTECTOMY WITH AXILLARY SENTINEL LYMPH NODE BIOPSY   . TUBAL LIGATION    . VAGINAL DELIVERY     x3    Patient Care Team: Gildardo Cranker, DO as PCP - General (Internal Medicine) Nicholas Lose, MD as Consulting Physician (Oncology) Erroll Luna, MD as Consulting Physician (General Surgery) Thea Silversmith, MD (Inactive) as Consulting Physician (Radiation Oncology)  Social History   Social History  . Marital status: Married    Spouse name: N/A  . Number of children: 3  . Years of education: N/A   Occupational History  . retired    Social History Main Topics  . Smoking status: Former Smoker    Packs/day: 0.50    Years: 25.00    Types: Cigarettes    Quit date: 05/21/2002  . Smokeless tobacco: Never Used  . Alcohol use 0.0 oz/week     Comment: occas  . Drug use: No  . Sexual activity: No   Other Topics Concern  . Not on file   Social History Narrative   Diet- N/A   Caffeine- Yes   Married- Yes   House- 2 story with 2 people   Pets- No   Current/past profession- Nurse, mental health   Exercise- Yes   Living will-No   DNR-N/A   POA/HPOA-No           reports that she quit smoking about 15 years ago. Her smoking use included Cigarettes. She has a 12.50 pack-year smoking history. She has never used smokeless tobacco. She reports that she drinks alcohol. She reports that she does not use drugs.  Family History  Problem Relation Age of Onset  . Stroke Sister   . Hypertension Mother   . Stroke Mother   .  Alcohol abuse Brother   . HIV/AIDS Brother    Family Status  Relation Status  . Sister Deceased  . Father Deceased at age 72  . Mother Deceased at age 20  . Brother Deceased  . Brother Deceased  . Brother Deceased     No Known Allergies  Medications: Patient's Medications  New Prescriptions   No medications on file  Previous Medications   ASPIRIN EC 81 MG TABLET    Take 1 tablet (81 mg total) by mouth daily.   LABETALOL (NORMODYNE) 300 MG TABLET    TAKE TWO TABLETS BY MOUTH THREE TIMES DAILY   LETROZOLE (FEMARA) 2.5 MG TABLET    Take 1 tablet (2.5 mg total) by mouth daily.   NIFEDIPINE (PROCARDIA-XL/ADALAT CC) 60 MG 24 HR TABLET    TAKE 1 TABLET BY MOUTH TWICE DAILY   PRAVASTATIN (PRAVACHOL) 10 MG TABLET    Take 1 tablet (10 mg total) by mouth at  bedtime.  Modified Medications   No medications on file  Discontinued Medications   No medications on file    Review of Systems  Neurological: Negative for dizziness.  Psychiatric/Behavioral: Positive for sleep disturbance.  All other systems reviewed and are negative.   Vitals:   07/13/17 0917  BP: 138/90  Pulse: 80  Temp: 98.7 F (37.1 C)  TempSrc: Oral  SpO2: 92%  Weight: 197 lb (89.4 kg)  Height: 5' 3"  (1.6 m)   Body mass index is 34.9 kg/m.  Physical Exam  Constitutional: She appears well-developed and well-nourished.  Neurological: She is alert.  Skin: Skin is warm and dry. No rash noted.  Psychiatric: She has a normal mood and affect. Her behavior is normal. Thought content normal.     Labs reviewed: Office Visit on 05/28/2017  Component Date Value Ref Range Status  . Site 05/28/2017 UNSPECIFIED   Final  . Color, Synovial 05/28/2017 YELLOW  STRAW/YELLOW Final  . Appearance-Synovial 05/28/2017 HAZY  CLEAR/HAZY Final  . WBC, Synovial 05/28/2017 249* <150 cells/uL Final   Comment:   Visible clumping present, results of cell count may be compromised.     . Neutrophil, Synovial 05/28/2017 7  0 - 24 % Final  . Lymphocytes-Synovial Fld 05/28/2017 54  0 - 74 % Final  . Monocyte/Macrophage 05/28/2017 38  0 - 69 % Final  . Eosinophils-Synovial 05/28/2017 0  0 - 2 % Final  . Basophils, % 05/28/2017 0  0 % Final  . Synoviocytes, % 05/28/2017 1  0 - 15 % Final  . Crystals 05/28/2017 SEE NOTE  NONE SEEN HPF Final   Comment:   Positive for calcium pyrophosphate crystals   Intracellular Extracellular     Lab on 05/26/2017  Component Date Value Ref Range Status  . Sodium 05/26/2017 140  135 - 146 mmol/L Final  . Potassium 05/26/2017 3.6  3.5 - 5.3 mmol/L Final  . Chloride 05/26/2017 103  98 - 110 mmol/L Final  . CO2 05/26/2017 25  20 - 31 mmol/L Final  . Glucose, Bld 05/26/2017 94  65 - 99 mg/dL Final  . BUN 05/26/2017 14  7 - 25 mg/dL Final  . Creat  05/26/2017 1.29* 0.50 - 0.99 mg/dL Final   Comment:   For patients > or = 62 years of age: The upper reference limit for Creatinine is approximately 13% higher for people identified as African-American.     . Total Bilirubin 05/26/2017 0.5  0.2 - 1.2 mg/dL Final  . Alkaline Phosphatase 05/26/2017 81  33 - 130 U/L Final  . AST 05/26/2017 23  10 - 35 U/L Final  . ALT 05/26/2017 19  6 - 29 U/L Final  . Total Protein 05/26/2017 6.9  6.1 - 8.1 g/dL Final  . Albumin 05/26/2017 4.6  3.6 - 5.1 g/dL Final  . Calcium 05/26/2017 9.7  8.6 - 10.4 mg/dL Final  . GFR, Est African American 05/26/2017 52* >=60 mL/min Final  . GFR, Est Non African American 05/26/2017 45* >=60 mL/min Final  . Cholesterol 05/26/2017 175  <200 mg/dL Final  . Triglycerides 05/26/2017 108  <150 mg/dL Final  . HDL 05/26/2017 65  >50 mg/dL Final  . Total CHOL/HDL Ratio 05/26/2017 2.7  <5.0 Ratio Final  . VLDL 05/26/2017 22  <30 mg/dL Final  . LDL Cholesterol 05/26/2017 88  <100 mg/dL Final  . WBC 05/26/2017 4.2  3.8 - 10.8 K/uL Final  . RBC 05/26/2017 4.02  3.80 - 5.10 MIL/uL Final  . Hemoglobin 05/26/2017 12.4  11.7 - 15.5 g/dL Final  . HCT 05/26/2017 37.5  35.0 - 45.0 % Final  . MCV 05/26/2017 93.3  80.0 - 100.0 fL Final  . MCH 05/26/2017 30.8  27.0 - 33.0 pg Final  . MCHC 05/26/2017 33.1  32.0 - 36.0 g/dL Final  . RDW 05/26/2017 13.5  11.0 - 15.0 % Final  . Platelets 05/26/2017 309  140 - 400 K/uL Final  . MPV 05/26/2017 10.0  7.5 - 12.5 fL Final  . Neutro Abs 05/26/2017 2562  1,500 - 7,800 cells/uL Final  . Lymphs Abs 05/26/2017 1302  850 - 3,900 cells/uL Final  . Monocytes Absolute 05/26/2017 210  200 - 950 cells/uL Final  . Eosinophils Absolute 05/26/2017 84  15 - 500 cells/uL Final  . Basophils Absolute 05/26/2017 42  0 - 200 cells/uL Final  . Neutrophils Relative % 05/26/2017 61  % Final  . Lymphocytes Relative 05/26/2017 31  % Final  . Monocytes Relative 05/26/2017 5  % Final  . Eosinophils Relative  05/26/2017 2  % Final  . Basophils Relative 05/26/2017 1  % Final  . Smear Review 05/26/2017 Criteria for review not met   Final  . TSH 05/26/2017 1.37  mIU/L Final   Comment:   Reference Range   > or = 20 Years  0.40-4.50   Pregnancy Range First trimester  0.26-2.66 Second trimester 0.55-2.73 Third trimester  0.43-2.91     . Color, Urine 05/26/2017 YELLOW  YELLOW Final  . APPearance 05/26/2017 CLEAR  CLEAR Final  . Specific Gravity, Urine 05/26/2017 1.015  1.001 - 1.035 Final  . pH 05/26/2017 6.0  5.0 - 8.0 Final  . Glucose, UA 05/26/2017 NEGATIVE  NEGATIVE Final  . Bilirubin Urine 05/26/2017 NEGATIVE  NEGATIVE Final  . Ketones, ur 05/26/2017 NEGATIVE  NEGATIVE Final  . Hgb urine dipstick 05/26/2017 NEGATIVE  NEGATIVE Final  . Protein, ur 05/26/2017 NEGATIVE  NEGATIVE Final  . Nitrite 05/26/2017 NEGATIVE  NEGATIVE Final  . Leukocytes, UA 05/26/2017 2+* NEGATIVE Final  . WBC, UA 05/26/2017 6-10* <=5 WBC/HPF Final  . RBC / HPF 05/26/2017 NONE SEEN  <=2 RBC/HPF Final  . Squamous Epithelial / LPF 05/26/2017 0-5  <=5 HPF Final  . Bacteria, UA 05/26/2017 NONE SEEN  NONE SEEN HPF Final  . Crystals 05/26/2017 NONE SEEN  NONE SEEN HPF Final  . Casts 05/26/2017 NONE SEEN  NONE SEEN LPF Final  . Yeast 05/26/2017 NONE SEEN  NONE SEEN HPF Final    No results found.  Assessment/Plan   ICD-10-CM   1. Encounter for completion of form with patient Z02.89   2. Screening-pulmonary TB Z11.1 PPD   Ok to drive small school bus with matron present. Do not recommend ambien due to desire to drive bus as it may compromise driving ability.  Follow up in 2 days to have TB skin test read - needs nurse room visit  Form completed today  Follow up as scheduled   Paz Winsett S. Perlie Gold  [facility] [address] [place], [place] [address] [phone] Cell (Monday-Friday 8 AM - 5 PM) [phone] After 5 PM and follow prompts

## 2017-07-15 ENCOUNTER — Ambulatory Visit (INDEPENDENT_AMBULATORY_CARE_PROVIDER_SITE_OTHER): Payer: Medicare HMO | Admitting: Internal Medicine

## 2017-07-15 VITALS — HR 83 | Temp 98.8°F | Resp 12

## 2017-07-15 DIAGNOSIS — Z111 Encounter for screening for respiratory tuberculosis: Secondary | ICD-10-CM

## 2017-07-15 LAB — TB SKIN TEST
Induration: 0 mm
TB SKIN TEST: NEGATIVE

## 2017-07-15 NOTE — Progress Notes (Signed)
TB test read at 48 hrs by CMA and negative.   Estefania Kamiya L. Nirel Babler, D.O. [facility] [address], [place] [address] Cell Phone (Mon-Fri 8am-5pm):  [phone] On Call:  [phone] & follow prompts after 5pm & weekends Office Phone:  [phone] Office Fax:  [phone]

## 2017-08-24 ENCOUNTER — Encounter: Payer: Self-pay | Admitting: Internal Medicine

## 2017-08-24 ENCOUNTER — Ambulatory Visit (INDEPENDENT_AMBULATORY_CARE_PROVIDER_SITE_OTHER): Payer: Medicare HMO | Admitting: Internal Medicine

## 2017-08-24 VITALS — BP 118/70 | HR 70 | Temp 98.4°F | Ht 63.0 in | Wt 192.0 lb

## 2017-08-24 DIAGNOSIS — E782 Mixed hyperlipidemia: Secondary | ICD-10-CM | POA: Diagnosis not present

## 2017-08-24 DIAGNOSIS — I10 Essential (primary) hypertension: Secondary | ICD-10-CM | POA: Diagnosis not present

## 2017-08-24 DIAGNOSIS — K219 Gastro-esophageal reflux disease without esophagitis: Secondary | ICD-10-CM | POA: Diagnosis not present

## 2017-08-24 DIAGNOSIS — I6930 Unspecified sequelae of cerebral infarction: Secondary | ICD-10-CM | POA: Diagnosis not present

## 2017-08-24 DIAGNOSIS — G4700 Insomnia, unspecified: Secondary | ICD-10-CM

## 2017-08-24 MED ORDER — ESZOPICLONE 2 MG PO TABS: 2.0000 mg | ORAL_TABLET | Freq: Every evening | ORAL | 0 refills | Status: DC | PRN

## 2017-08-24 NOTE — Progress Notes (Signed)
Patient ID: Michele Page, female   DOB: 1955-12-11, 61 y.o.   MRN: [id number]   Location:      Place of Service:    Provider:   Patient Care Team: Gildardo Cranker, DO as PCP - General (Internal Medicine) Nicholas Lose, MD as Consulting Physician (Oncology) Erroll Luna, MD as Consulting Physician (General Surgery) Thea Silversmith, MD (Inactive) as Consulting Physician (Radiation Oncology)  Extended Emergency Contact Information Primary Emergency Contact: Livesay,Russell Address: [place], [place] [address] Johnnette Litter of [place] Phone: [phone] Mobile Phone: [phone] Relation: Spouse  Code Status:  Goals of Care: Advanced Directive information Advanced Directives 05/21/2017  Does Patient Have a Medical Advance Directive? No  Would patient like information on creating a medical advance directive? No - Patient declined     Chief Complaint  Patient presents with  . Medical Management of Chronic Issues    3 month follow-up on HTN, knee pain and hair loss   . Medication Refill    No refills needed   . Best Pratice Recommendations    Refused pap smear and all vaccine updates     HPI: Patient is a 60 y.o. female seen in today for an annual wellness exam.   Patient declines vaccination for flu and shingles. She says she is taking her medications and wants to back off of medication. She does agree that she needs to take medication for her HTN and cholesorol.  HTN - stable on labetalol and procardia. BP at home 130s/70-80s. She does take meds at same time every day. Followed by cardio Dr Gwenlyn Found. She takes ASA daily  Hyperlipidemia - stable on pravastatin. LDL 84  COPD - no exacerbations. No issues with breathing  Hx CVA - no new sx's. Takes statin and ASA daily. LDL 84. Followed by Dr Felecia Shelling  MS - no obvious sx's - no diplopia but she cannot "stare" for prolonged time without blinking. Followed by Neurology Dr Felecia Shelling. No  exacerbations  Gastritis/GERD - she stopped her nexium on her own.  She had a colonoscopy last yr and tubular adenoma polyps removed. No acid reflux sx's.  Chronic Kidney Disease - CR 1.29 and is down from 1.49.  Depression screen [facility] 2/9 05/21/2017 02/15/2017 12/11/2016 08/11/2016 05/15/2016  Decreased Interest 0 0 0 0 0  Down, Depressed, Hopeless 0 0 0 0 0  PHQ - 2 Score 0 0 0 0 0    Fall Risk  08/24/2017 05/21/2017 02/15/2017 12/22/2016 12/11/2016  Falls in the past year? No No No No No   MMSE - Mini Mental State Exam 12/11/2016  Orientation to time 5  Orientation to Place 4  Registration 3  Attention/ Calculation 5  Recall 2  Language- name 2 objects 2  Language- repeat 1  Language- follow 3 step command 2  Language- read & follow direction 1  Write a sentence 1  Copy design 0  Total score 26     Health Maintenance  Topic Date Due  . Hepatitis C Screening  11/16/2017 (Originally [birth year])  . INFLUENZA VACCINE  11/16/2018 (Originally 06/16/2017)  . PAP SMEAR  11/16/2018 (Originally 08/03/1976)  . HIV Screening  11/16/2018 (Originally 08/03/1970)  . TETANUS/TDAP  11/17/2019 (Originally 08/03/1974)  . MAMMOGRAM  03/31/2018  . COLONOSCOPY  07/07/2018    Urinary incontinence? Functional Status Survey:   Exercise? Diet? No exam data present Hearing:   Dentition: Pain:  Past Medical History:  Diagnosis Date  .  Arthritis    lt knee  . Breast cancer ([place])   . Breast cancer of upper-outer quadrant of left female breast ([place]) 04/02/2016  . Colon polyps   . COPD (chronic obstructive pulmonary disease) ([place])   . Hyperlipidemia   . Hypertension   . Multiple sclerosis ([place])   . Stroke ([place])    2013, mild cognitive deficits  . Vision abnormalities     Past Surgical History:  Procedure Laterality Date  . HYSTEROSCOPY W/D&C N/A 12/19/2015   Procedure: DILATATION AND CURETTAGE /HYSTEROSCOPY;  Surgeon: Linda Hedges, DO;  Location: [facility];  Service: Gynecology;  Laterality: N/A;  .  RADIOACTIVE SEED GUIDED MASTECTOMY WITH AXILLARY SENTINEL LYMPH NODE BIOPSY Left 04/17/2016   Procedure: RADIOACTIVE SEED GUIDED PARTIAL MASTECTOMY WITH AXILLARY SENTINEL LYMPH NODE BIOPSY;  Surgeon: Erroll Luna, MD;  Location: [facility];  Service: General;  Laterality: Left;  RADIOACTIVE SEED GUIDED PARTIAL MASTECTOMY WITH AXILLARY SENTINEL LYMPH NODE BIOPSY   . TUBAL LIGATION    . VAGINAL DELIVERY     x3    Family History  Problem Relation Age of Onset  . Stroke Sister   . Hypertension Mother   . Stroke Mother   . Alcohol abuse Brother   . HIV/AIDS Brother     Social History   Social History  . Marital status: Married    Spouse name: N/A  . Number of children: 3  . Years of education: N/A   Occupational History  . retired    Social History Main Topics  . Smoking status: Former Smoker    Packs/day: 0.50    Years: 25.00    Types: Cigarettes    Quit date: 11/16/2004  . Smokeless tobacco: Never Used  . Alcohol use 0.0 oz/week     Comment: occas  . Drug use: No  . Sexual activity: No   Other Topics Concern  . None   Social History Narrative   Diet- N/A   Caffeine- Yes   Married- Yes   House- 2 story with 2 people   Pets- No   Current/past profession- Insurance underwriter daycare   Exercise- Yes   Living will-No   DNR-N/A   POA/HPOA-No          reports that she quit smoking about 12 years ago. Her smoking use included Cigarettes. She has a 12.50 pack-year smoking history. She has never used smokeless tobacco. She reports that she drinks alcohol. She reports that she does not use drugs.   No Known Allergies  Outpatient Encounter Prescriptions as of 08/24/2017  Medication Sig  . aspirin EC 81 MG tablet Take 1 tablet (81 mg total) by mouth daily.  Marland Kitchen labetalol (NORMODYNE) 300 MG tablet TAKE TWO TABLETS BY MOUTH THREE TIMES DAILY  . letrozole (FEMARA) 2.5 MG tablet Take 1 tablet (2.5 mg total) by mouth daily.  Marland Kitchen NIFEdipine (PROCARDIA-XL/ADALAT CC) 60 MG  24 hr tablet TAKE 1 TABLET BY MOUTH TWICE DAILY  . pravastatin (PRAVACHOL) 10 MG tablet Take 1 tablet (10 mg total) by mouth at bedtime.   No facility-administered encounter medications on file as of 08/24/2017.      Review of Systems:  Review of Systems  Constitutional: Negative for activity change, appetite change, chills, fatigue and fever.  HENT: Negative for congestion, ear pain, postnasal drip, rhinorrhea, sinus pain, sinus pressure and sore throat.   Eyes: Negative for pain, discharge and itching.  Respiratory: Negative for cough, chest tightness and shortness of breath.   Cardiovascular: Negative  for chest pain, palpitations and leg swelling.  Gastrointestinal: Negative for abdominal distention, abdominal pain, blood in stool, constipation, diarrhea, nausea and vomiting.  Endocrine: Negative for polyuria.  Genitourinary: Negative for decreased urine volume, difficulty urinating, dysuria, flank pain, frequency, hematuria, pelvic pain, urgency and vaginal bleeding.  Musculoskeletal: Positive for arthralgias (Left knee) and joint swelling (left knee). Negative for back pain, gait problem, myalgias, neck pain and neck stiffness.  Skin: Negative for color change.  Allergic/Immunologic: Negative for environmental allergies.  Neurological: Negative for dizziness, speech difficulty, weakness, light-headedness, numbness and headaches.  Hematological: Negative for adenopathy.  Psychiatric/Behavioral: Negative for agitation, behavioral problems, confusion, decreased concentration, dysphoric mood, self-injury, sleep disturbance and suicidal ideas. The patient is not nervous/anxious.     Physical Exam: Vitals:   08/24/17 1024  BP: 118/70  Pulse: 70  Temp: 98.4 F (36.9 C)  TempSrc: Oral  SpO2: 96%  Weight: 192 lb (87.1 kg)  Height: 5\' 3"  (1.6 m)   Body mass index is 34.01 kg/m. Physical Exam  Constitutional: She is oriented to person, place, and time. She appears well-developed and  well-nourished. No distress.  HENT:  Head: Normocephalic and atraumatic.  Right Ear: External ear normal.  Left Ear: External ear normal.  Nose: Nose normal.  Mouth/Throat: Oropharynx is clear and moist. No oropharyngeal exudate.  Eyes: Pupils are equal, round, and reactive to light. Conjunctivae and EOM are normal. Right eye exhibits no discharge. Left eye exhibits no discharge. No scleral icterus.  Neck: Normal range of motion. Neck supple. No JVD present.  Cardiovascular: Normal rate, regular rhythm and normal heart sounds.  Exam reveals no gallop and no friction rub.   No murmur heard. Pulmonary/Chest: Effort normal and breath sounds normal. No stridor. No respiratory distress. She has no wheezes. She has no rales. She exhibits no tenderness.  Abdominal: Soft. Bowel sounds are normal. She exhibits no distension and no mass. There is no tenderness. There is no rebound and no guarding.  Musculoskeletal: Normal range of motion. She exhibits no edema, tenderness or deformity.  Lymphadenopathy:    She has no cervical adenopathy.  Neurological: She is alert and oriented to person, place, and time. No cranial nerve deficit. Coordination normal.  Skin: Skin is warm and dry. She is not diaphoretic.  Psychiatric: She has a normal mood and affect. Her behavior is normal. Judgment and thought content normal.    Labs reviewed: Basic Metabolic Panel:  Recent Labs  12/18/16 0001 05/26/17 0932  NA 140 140  K 3.9 3.6  CL 105 103  CO2 25 25  GLUCOSE 120* 94  BUN 17 14  CREATININE 1.49* 1.29*  CALCIUM 9.2 9.7  TSH  --  1.37   Liver Function Tests:  Recent Labs  12/18/16 0001 05/26/17 0932  AST 19 23  ALT 15 19  ALKPHOS 74 81  BILITOT 0.4 0.5  PROT 6.6 6.9  ALBUMIN 4.3 4.6   No results for input(s): LIPASE, AMYLASE in the last 8760 hours. No results for input(s): AMMONIA in the last 8760 hours. CBC:  Recent Labs  05/26/17 0932  WBC 4.2  NEUTROABS 2,562  HGB 12.4  HCT 37.5   MCV 93.3  PLT 309   Lipid Panel:  Recent Labs  12/18/16 0001 05/26/17 0932  CHOL 161 175  HDL 57 65  LDLCALC 84 88  TRIG 98 108  CHOLHDL 2.8 2.7   No results found for: HGBA1C  Procedures: No results found.  Assessment/Plan  1. Essential hypertension  2. Gastroesophageal reflux disease without esophagitis   3. Pure hypercholesterolemia     Labs/tests ordered:   Next appt:    Rueben Kassim C. Rajah Tagliaferro, Student AGACNP

## 2017-08-24 NOTE — Progress Notes (Signed)
Patient ID: Michele Page, female   DOB: [birth year], 61 y.o.   MRN: [id number]    Location:  [facility] Place of Service: OFFICE  Chief Complaint  Patient presents with  . Medical Management of Chronic Issues    3 month follow-up on HTN, knee pain and hair loss   . Medication Refill    No refills needed   . Best Pratice Recommendations    Refused pap smear and all vaccine updates     HPI:  61 yo female seen today for f/u. 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 She has not had an episode of dizziness. She did get vestibular rehab exercises form PT.   Left knee pain - stable. She did f/u with Ortho for 2nd opinion and had fluid aspirated which helped. She wears a knee brace prn which also helps. In the past, she has rec'd knee injection by Ortho that she states made her pain worse and she has gait dysfunction due to knee. She no longer takes any medication for it. She would like a 2nd opinion. Knee is swollen and stiff and she notes swelling distally. MRI done in March 2018 revealed large joint effusion and tricompartmental cartilage abnormalities, and lateral femoral condyle friction syndrome.   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 She completed XRT in June 2018. She was told no chemotx needed. She has trouble falling asleep since dx.  Nothing tried OTC. Charolette Forward in the past but felt like it was ineffective. Started arimidex but was changed to femara in 04/2017 due to hair loss. Followed by Dr Lindi Adie  HTN - stable on labetalol and procardia. BP at home 130s/70-80s. She does take meds at same time every day. Followed by cardio Dr Gwenlyn Found. She takes ASA daily  Hyperlipidemia - stable on pravastatin. LDL 88  COPD - no exacerbations. No issues with breathing  Hx CVA - no new sx's. Takes statin and ASA daily. LDL 84. Followed by Dr Felecia Shelling. MRI brain in Apr 2018 revealed remote hemorrhagic infarct in left basal ganglia and thalamus.  MS - no  obvious sx's. Stable.  no diplopia but she cannot "stare" for prolonged time without blinking. Followed by Neurology Dr Felecia Shelling. No exacerbations. 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  Gastritis/GERD - she stopped her nexium on her own.  She had a colonoscopy last yr and tubular adenoma polyps removed. No acid reflux sx's.  CKD - probably due to HTN. Cr stable at 1.29   Past Medical History:  Diagnosis Date  . Arthritis    lt knee  . Breast cancer ([place])   . Breast cancer of upper-outer quadrant of left female breast ([place]) 04/02/2016  . Colon polyps   . COPD (chronic obstructive pulmonary disease) ([place])   . Hyperlipidemia   . Hypertension   . Multiple sclerosis ([place])   . 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 Vision abnormalities     Past Surgical History:  Procedure Laterality Date  . HYSTEROSCOPY W/D&C N/A 12/19/2015   Procedure: DILATATION AND CURETTAGE /HYSTEROSCOPY;  Surgeon: Linda Hedges, DO;  Location: [facility];  Service: Gynecology;  Laterality: N/A;  . RADIOACTIVE SEED GUIDED MASTECTOMY WITH AXILLARY SENTINEL LYMPH NODE BIOPSY Left 04/17/2016   Procedure: RADIOACTIVE SEED GUIDED PARTIAL MASTECTOMY WITH AXILLARY SENTINEL LYMPH NODE BIOPSY;  Surgeon: Erroll Luna, MD;  Location: [facility];  Service: General;  Laterality: Left;  RADIOACTIVE SEED GUIDED PARTIAL MASTECTOMY WITH AXILLARY SENTINEL LYMPH NODE BIOPSY   . TUBAL LIGATION    . VAGINAL DELIVERY     x3    Patient Care Team: Gildardo Cranker, DO as PCP - General (Internal Medicine) Nicholas Lose, MD as Consulting Physician (Oncology) Erroll Luna, MD as Consulting Physician (General Surgery) Thea Silversmith, MD (Inactive) as Consulting Physician (Radiation Oncology)  Social History   Social History  . Marital status: Married    Spouse name: N/A  . Number of children: 3  . Years of education: N/A   Occupational  History  . retired    Social History Main Topics  . Smoking status: Former Smoker    Packs/day: 0.50    Years: 25.00    Types: Cigarettes    Quit date: 11/16/2004  . Smokeless tobacco: Never Used  . Alcohol use 0.0 oz/week     Comment: occas  . Drug use: No  . Sexual activity: No   Other Topics Concern  . Not on file   Social History Narrative   Diet- N/A   Caffeine- Yes   Married- Yes   House- 2 story with 2 people   Pets- No   Current/past profession- Nurse, mental health   Exercise- Yes   Living will-No   DNR-N/A   POA/HPOA-No           reports that she quit smoking about 12 years ago. Her smoking use included Cigarettes. She has a 12.50 pack-year smoking history. She has never used smokeless tobacco. She reports that she drinks alcohol. She reports that she does not use drugs.  Family History  Problem Relation Age of Onset  . Stroke Sister   . Hypertension Mother   . Stroke Mother   . Alcohol abuse Brother   . HIV/AIDS Brother    Family Status  Relation Status  . Sister Deceased  . Father Deceased at age 43  . Mother Deceased at age 80  . Brother Deceased  . Brother Deceased  . Brother Deceased     No Known Allergies  Medications: Patient's Medications  New Prescriptions   No medications on file  Previous Medications   ASPIRIN EC 81 MG TABLET    Take 1 tablet (81 mg total) by mouth daily.   LABETALOL (NORMODYNE) 300 MG TABLET    TAKE TWO TABLETS BY MOUTH THREE TIMES DAILY   LETROZOLE (FEMARA) 2.5 MG TABLET    Take 1 tablet (2.5 mg total) by mouth daily.   NIFEDIPINE (PROCARDIA-XL/ADALAT CC) 60 MG 24 HR TABLET    TAKE 1 TABLET BY MOUTH TWICE DAILY   PRAVASTATIN (PRAVACHOL) 10 MG TABLET    Take 1 tablet (10 mg total) by mouth at bedtime.  Modified Medications   No medications on file  Discontinued Medications   No medications on file    Review of Systems  Musculoskeletal: Positive for arthralgias and gait problem.  Psychiatric/Behavioral:  Positive for sleep disturbance. The patient is nervous/anxious.   All other systems reviewed and are negative.   Vitals:   08/24/17 1024  BP: 118/70  Pulse: 70  Temp: 98.4 F (36.9 C)  TempSrc: Oral  SpO2: 96%  Weight: 192 lb (87.1 kg)  Height: 5' 3"  (1.6 m)   Body mass index is 34.01 kg/m.  Physical Exam  Constitutional: She is oriented to person, place, and time. She appears well-developed and well-nourished.  HENT:  Mouth/Throat: Oropharynx is clear and moist. No oropharyngeal  exudate.  MMM; no oral thrush  Eyes: Pupils are equal, round, and reactive to light. No scleral icterus.  Neck: Neck supple. Carotid bruit is not present. No tracheal deviation present. No thyromegaly present.  Cardiovascular: Normal rate, regular rhythm, normal heart sounds and intact distal pulses.  Exam reveals no gallop and no friction rub.   No murmur heard. No LE edema b/l. no calf TTP.   Pulmonary/Chest: Effort normal and breath sounds normal. No stridor. No respiratory distress. She has no wheezes. She has no rales.  Abdominal: Soft. Normal appearance and bowel sounds are normal. She exhibits no distension and no mass. There is no hepatomegaly. There is no tenderness. There is no rigidity, no rebound and no guarding. No hernia.  Musculoskeletal: She exhibits edema (left knee) and tenderness (left knee).  Reduced left knee flexion/extension  Lymphadenopathy:    She has no cervical adenopathy.  Neurological: She is alert and oriented to person, place, and time. She has normal reflexes.  Skin: Skin is warm and dry. No rash noted.  Psychiatric: She has a normal mood and affect. Her behavior is normal. Judgment and thought content normal.     Labs reviewed: Office Visit on 07/13/2017  Component Date Value Ref Range Status  . TB Skin Test 07/15/2017 Negative   Final  . Induration 07/15/2017 0  mm Final  Office Visit on 05/28/2017  Component Date Value Ref Range Status  . Site 05/28/2017  UNSPECIFIED   Final  . Color, Synovial 05/28/2017 YELLOW  STRAW/YELLOW Final  . Appearance-Synovial 05/28/2017 HAZY  CLEAR/HAZY Final  . WBC, Synovial 05/28/2017 249* <150 cells/uL Final   Comment:   Visible clumping present, results of cell count may be compromised.     . Neutrophil, Synovial 05/28/2017 7  0 - 24 % Final  . Lymphocytes-Synovial Fld 05/28/2017 54  0 - 74 % Final  . Monocyte/Macrophage 05/28/2017 38  0 - 69 % Final  . Eosinophils-Synovial 05/28/2017 0  0 - 2 % Final  . Basophils, % 05/28/2017 0  0 % Final  . Synoviocytes, % 05/28/2017 1  0 - 15 % Final  . Crystals 05/28/2017 SEE NOTE  NONE SEEN HPF Final   Comment:   Positive for calcium pyrophosphate crystals   Intracellular Extracellular     Lab on 05/26/2017  Component Date Value Ref Range Status  . Sodium 05/26/2017 140  135 - 146 mmol/L Final  . Potassium 05/26/2017 3.6  3.5 - 5.3 mmol/L Final  . Chloride 05/26/2017 103  98 - 110 mmol/L Final  . CO2 05/26/2017 25  20 - 31 mmol/L Final  . Glucose, Bld 05/26/2017 94  65 - 99 mg/dL Final  . BUN 05/26/2017 14  7 - 25 mg/dL Final  . Creat 05/26/2017 1.29* 0.50 - 0.99 mg/dL Final   Comment:   For patients > or = 62 years of age: The upper reference limit for Creatinine is approximately 13% higher for people identified as African-American.     . Total Bilirubin 05/26/2017 0.5  0.2 - 1.2 mg/dL Final  . Alkaline Phosphatase 05/26/2017 81  33 - 130 U/L Final  . AST 05/26/2017 23  10 - 35 U/L Final  . ALT 05/26/2017 19  6 - 29 U/L Final  . Total Protein 05/26/2017 6.9  6.1 - 8.1 g/dL Final  . Albumin 05/26/2017 4.6  3.6 - 5.1 g/dL Final  . Calcium 05/26/2017 9.7  8.6 - 10.4 mg/dL Final  . GFR, Est African American 05/26/2017  52* >=60 mL/min Final  . GFR, Est Non African American 05/26/2017 45* >=60 mL/min Final  . Cholesterol 05/26/2017 175  <200 mg/dL Final  . Triglycerides 05/26/2017 108  <150 mg/dL Final  . HDL 05/26/2017 65  >50 mg/dL Final  . Total  CHOL/HDL Ratio 05/26/2017 2.7  <5.0 Ratio Final  . VLDL 05/26/2017 22  <30 mg/dL Final  . LDL Cholesterol 05/26/2017 88  <100 mg/dL Final  . WBC 05/26/2017 4.2  3.8 - 10.8 K/uL Final  . RBC 05/26/2017 4.02  3.80 - 5.10 MIL/uL Final  . Hemoglobin 05/26/2017 12.4  11.7 - 15.5 g/dL Final  . HCT 05/26/2017 37.5  35.0 - 45.0 % Final  . MCV 05/26/2017 93.3  80.0 - 100.0 fL Final  . MCH 05/26/2017 30.8  27.0 - 33.0 pg Final  . MCHC 05/26/2017 33.1  32.0 - 36.0 g/dL Final  . RDW 05/26/2017 13.5  11.0 - 15.0 % Final  . Platelets 05/26/2017 309  140 - 400 K/uL Final  . MPV 05/26/2017 10.0  7.5 - 12.5 fL Final  . Neutro Abs 05/26/2017 2562  1,500 - 7,800 cells/uL Final  . Lymphs Abs 05/26/2017 1302  850 - 3,900 cells/uL Final  . Monocytes Absolute 05/26/2017 210  200 - 950 cells/uL Final  . Eosinophils Absolute 05/26/2017 84  15 - 500 cells/uL Final  . Basophils Absolute 05/26/2017 42  0 - 200 cells/uL Final  . Neutrophils Relative % 05/26/2017 61  % Final  . Lymphocytes Relative 05/26/2017 31  % Final  . Monocytes Relative 05/26/2017 5  % Final  . Eosinophils Relative 05/26/2017 2  % Final  . Basophils Relative 05/26/2017 1  % Final  . Smear Review 05/26/2017 Criteria for review not met   Final  . TSH 05/26/2017 1.37  mIU/L Final   Comment:   Reference Range   > or = 20 Years  0.40-4.50   Pregnancy Range First trimester  0.26-2.66 Second trimester 0.55-2.73 Third trimester  0.43-2.91     . Color, Urine 05/26/2017 YELLOW  YELLOW Final  . APPearance 05/26/2017 CLEAR  CLEAR Final  . Specific Gravity, Urine 05/26/2017 1.015  1.001 - 1.035 Final  . pH 05/26/2017 6.0  5.0 - 8.0 Final  . Glucose, UA 05/26/2017 NEGATIVE  NEGATIVE Final  . Bilirubin Urine 05/26/2017 NEGATIVE  NEGATIVE Final  . Ketones, ur 05/26/2017 NEGATIVE  NEGATIVE Final  . Hgb urine dipstick 05/26/2017 NEGATIVE  NEGATIVE Final  . Protein, ur 05/26/2017 NEGATIVE  NEGATIVE Final  . Nitrite 05/26/2017 NEGATIVE  NEGATIVE  Final  . Leukocytes, UA 05/26/2017 2+* NEGATIVE Final  . WBC, UA 05/26/2017 6-10* <=5 WBC/HPF Final  . RBC / HPF 05/26/2017 NONE SEEN  <=2 RBC/HPF Final  . Squamous Epithelial / LPF 05/26/2017 0-5  <=5 HPF Final  . Bacteria, UA 05/26/2017 NONE SEEN  NONE SEEN HPF Final  . Crystals 05/26/2017 NONE SEEN  NONE SEEN HPF Final  . Casts 05/26/2017 NONE SEEN  NONE SEEN LPF Final  . Yeast 05/26/2017 NONE SEEN  NONE SEEN HPF Final    No results found.   Assessment/Plan   ICD-10-CM   1. Insomnia, unspecified type G47.00 eszopiclone (LUNESTA) 2 MG TABS tablet  2. Essential hypertension I10   3. Gastroesophageal reflux disease without esophagitis K21.9   4. Mixed hyperlipidemia E78.2   5. History of stroke with residual deficit I69.30    START LUNESTA AT BEDTIME AS NEEDED TO HELP SLEEP  Continue other medications as ordered  Follow  up with Ortho for left knee pain  Follow up in 3 mos for hx CVA, HTN, hyperlipidemia, GERD, breast cancer  Octavian Godek S. Perlie Gold  [facility] [address] [place], [place] [address] [phone] Cell (Monday-Friday 8 AM - 5 PM) [phone] After 5 PM and follow prompts

## 2017-08-24 NOTE — Patient Instructions (Addendum)
START LUNESTA AT BEDTIME AS NEEDED TO HELP SLEEP  Continue other medications as ordered  Follow up with Ortho for left knee pain  Follow up in 3 mos for hx CVA, HTN, hyperlipidemia, GERD, breast cancer

## 2017-09-08 NOTE — Telephone Encounter (Signed)
Note made in error

## 2017-09-23 ENCOUNTER — Encounter (INDEPENDENT_AMBULATORY_CARE_PROVIDER_SITE_OTHER): Payer: Self-pay | Admitting: Orthopaedic Surgery

## 2017-09-23 ENCOUNTER — Ambulatory Visit (INDEPENDENT_AMBULATORY_CARE_PROVIDER_SITE_OTHER): Payer: Medicare HMO | Admitting: Orthopaedic Surgery

## 2017-09-23 DIAGNOSIS — M1712 Unilateral primary osteoarthritis, left knee: Secondary | ICD-10-CM | POA: Diagnosis not present

## 2017-09-23 MED ORDER — LIDOCAINE HCL 1 % IJ SOLN
2.0000 mL | INTRAMUSCULAR | Status: AC | PRN
Start: 2017-09-23 — End: 2017-09-23
  Administered 2017-09-23: 2 mL

## 2017-09-23 MED ORDER — BUPIVACAINE HCL 0.5 % IJ SOLN
2.0000 mL | INTRAMUSCULAR | Status: AC | PRN
  Administered 2017-09-23: 2 mL via INTRA_ARTICULAR

## 2017-09-23 MED ORDER — METHYLPREDNISOLONE ACETATE 40 MG/ML IJ SUSP
40.0000 mg | INTRAMUSCULAR | Status: AC | PRN
  Administered 2017-09-23: 40 mg via INTRA_ARTICULAR

## 2017-09-23 NOTE — Progress Notes (Signed)
Office Visit Note   Patient: Michele Page           Date of Birth: 1956-07-13           MRN: [id number] Visit Date: 09/23/2017              Requested by: Gildardo Cranker, [address] [place], [place] [id number] PCP: Gildardo Cranker, DO   Assessment & Plan: Visit Diagnoses:  1. Primary osteoarthritis of left knee     Plan: Impression is left knee arthritis.  Cortisone injection was performed today.  Reassurance is given that the Baker's cyst is benign.  Questions encouraged and answered.  Follow-up as needed.  Follow-Up Instructions: Return if symptoms worsen or fail to improve.   Orders:  No orders of the defined types were placed in this encounter.  No orders of the defined types were placed in this encounter.     Procedures: Large Joint Inj: L knee on 09/23/2017 11:39 AM Details: 22 G needle Medications: 2 mL bupivacaine 0.5 %; 2 mL lidocaine 1 %; 40 mg methylPREDNISolone acetate 40 MG/ML Outcome: tolerated well, no immediate complications Patient was prepped and draped in the usual sterile fashion.       Clinical Data: No additional findings.   Subjective: Chief Complaint  Patient presents with  . Left Knee - Pain    Patient comes in today for follow-up of her left knee pain.  She is experiencing a Baker's cyst and pain in the back of the knee.  She denies any significant swelling or any calf tenderness or chest pain or shortness of breath.  Her previous injection did help significantly which was done about 4 months ago.    Review of Systems   Objective: Vital Signs: There were no vitals taken for this visit.  Physical Exam  Ortho Exam Left knee exam shows no joint effusion.  She has a small palpable Baker's cyst in this area.  No calf tenderness.  Negative Hoffmann sign. Specialty Comments:  No specialty comments available.  Imaging: No results found.   PMFS History: Patient Active Problem List   Diagnosis Date Noted  . Breast cancer of  upper-outer quadrant of left female breast ([place]) 04/02/2016  . Essential hypertension 01/10/2016  . 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 Gastroesophageal reflux disease without esophagitis 01/10/2016  . Hyperlipidemia 01/10/2016  . CKD (chronic kidney disease) 01/10/2016  . Multiple sclerosis ([place]) 03/20/2015  . Accelerated hypertension 03/20/2015  . CVA (cerebral infarction) 03/20/2015  . Hemiplegia following CVA (cerebrovascular accident) ([place]) 03/20/2015  . Cognitive disorder 03/20/2015  . Nocturia 03/20/2015  . Snoring 03/20/2015  . Aphasia S/P CVA 03/20/2015  . 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 Arthritis    lt knee  . Breast cancer ([place])   . Breast cancer of upper-outer quadrant of left female breast ([place]) 04/02/2016  . Colon polyps   . COPD (chronic obstructive pulmonary disease) ([place])   . Hyperlipidemia   . Hypertension   . Multiple sclerosis ([place])   . Stroke ([place])    2013, mild cognitive deficits  . Vision abnormalities     Family History  Problem Relation Age of Onset  . Stroke Sister   . Hypertension Mother   . Stroke Mother   . Alcohol abuse Brother   . HIV/AIDS Brother     Past Surgical History:  Procedure Laterality Date  .  TUBAL LIGATION    . VAGINAL DELIVERY     x3   Social History   Occupational History  . Occupation: retired  Tobacco Use  . Smoking status: Former Smoker    Packs/day: 0.50    Years: 25.00    Pack years: 12.50    Types: Cigarettes    Last attempt to quit: 11/16/2004    Years since quitting: 12.8  . Smokeless tobacco: Never Used  Substance and Sexual Activity  . Alcohol use: Yes    Alcohol/week: 0.0 oz    Comment: occas  . Drug use: No  . Sexual activity: No

## 2017-09-27 ENCOUNTER — Other Ambulatory Visit: Payer: Self-pay | Admitting: *Deleted

## 2017-09-27 MED ORDER — PRAVASTATIN SODIUM 10 MG PO TABS: 10.0000 mg | ORAL_TABLET | Freq: Every day | ORAL | 1 refills | Status: DC

## 2017-09-27 NOTE — Telephone Encounter (Signed)
Michele Page

## 2017-10-22 ENCOUNTER — Ambulatory Visit (HOSPITAL_BASED_OUTPATIENT_CLINIC_OR_DEPARTMENT_OTHER): Payer: Medicare HMO | Admitting: Hematology and Oncology

## 2017-10-22 ENCOUNTER — Telehealth: Payer: Self-pay | Admitting: Hematology and Oncology

## 2017-10-22 DIAGNOSIS — Z170 Estrogen receptor positive status [ER+]: Secondary | ICD-10-CM

## 2017-10-22 DIAGNOSIS — Z79811 Long term (current) use of aromatase inhibitors: Secondary | ICD-10-CM | POA: Diagnosis not present

## 2017-10-22 DIAGNOSIS — C50412 Malignant neoplasm of upper-outer quadrant of left female breast: Secondary | ICD-10-CM | POA: Diagnosis not present

## 2017-10-22 MED ORDER — TAMOXIFEN CITRATE 20 MG PO TABS: 20.0000 mg | ORAL_TABLET | Freq: Every day | ORAL | 3 refills | Status: DC

## 2017-10-22 NOTE — Telephone Encounter (Signed)
Gave patient schedule/avs per 12/7/ los

## 2017-10-22 NOTE — Progress Notes (Signed)
Patient Care Team: Gildardo Cranker, DO as PCP - General (Internal Medicine) Nicholas Lose, MD as Consulting Physician (Oncology) Erroll Luna, MD as Consulting Physician (General Surgery) Thea Silversmith, MD (Inactive) as Consulting Physician (Radiation Oncology)  DIAGNOSIS:  Encounter Diagnosis  Name Primary?  . Malignant neoplasm of upper-outer quadrant of left breast in female, estrogen receptor positive ([place])     SUMMARY OF ONCOLOGIC HISTORY:   Breast cancer of upper-outer quadrant of left female breast ([place])   03/31/2016 Initial Diagnosis    Screening detected left breast mass, 2 nodules, 1.9 x 1.6 x 0.8 cm= fat necrosis; 8 x 7 x 7 mm= grade 2 IDC ER 100%, PR 100%, HER-2 negative ratio 1.39, Ki-67 20%      04/17/2016 Surgery    Left lumpectomy ([name]): IDC grade 2, 1.3 cm, with DCIS, margins negative, 0/4 lymph nodes negative, T1 cN0 stage IA pathologic stage, ER 100%, PR 100%, HER-2 negative ratio 1.39, Ki-67 20% Oncotype DX score 20, 13% ROR, intermediate risk      04/17/2016 Oncotype testing    Recurrence score: 20; ROR 15% (intermediate risk)       05/27/2016 - 07/13/2016 Radiation Therapy    Adjuvant radiation therapy [facility]): Left breast treated with breath hold to 50.4 Gy in 28 fractions at 1.8 Gy/fraction.  Left breast boosted to 10 Gy in 5 fractions at 2 Gy/fraction      07/23/2016 -  Anti-estrogen oral therapy    Anastrozole 1 mg switched to letrozole 04/22/2017 due to hair loss, switch to tamoxifen 10/22/2017 due to hair loss from letrozole as well       CHIEF COMPLIANT: Follow-up on letrozole therapy  INTERVAL HISTORY: Michele Page is a 61 year old with above-mentioned history of left breast cancer treated with lumpectomy adjuvant radiation therapy and is currently on antiestrogen therapy.  She was initially on anastrozole and has noted hair thinning because this was switched at letrozole.  In spite of this she is continuing to lose hair.  She is very unhappy  about this..  Denies any hot flashes or arthralgias or myalgias.  Denies any lumps or nodules in the breast.  REVIEW OF SYSTEMS:   Constitutional: Denies fevers, chills or abnormal weight loss Eyes: Denies blurriness of vision Ears, nose, mouth, throat, and face: Denies mucositis or sore throat Respiratory: Denies cough, dyspnea or wheezes Cardiovascular: Denies palpitation, chest discomfort Gastrointestinal:  Denies nausea, heartburn or change in bowel habits Skin: Denies abnormal skin rashes Lymphatics: Denies new lymphadenopathy or easy bruising Neurological:Denies numbness, tingling or new weaknesses Behavioral/Psych: Mood is stable, no new changes  Extremities: No lower extremity edema Breast:  denies any pain or lumps or nodules in either breasts All other systems were reviewed with the patient and are negative.  I have reviewed the past medical history, past surgical history, social history and family history with the patient and they are unchanged from previous note.  ALLERGIES:  has No Known Allergies.  MEDICATIONS:  Current Outpatient Medications  Medication Sig Dispense Refill  . aspirin EC 81 MG tablet Take 1 tablet (81 mg total) by mouth daily. 30 tablet 6  . eszopiclone (LUNESTA) 2 MG TABS tablet Take 1 tablet (2 mg total) by mouth at bedtime as needed for sleep. Take immediately before bedtime 15 tablet 0  . labetalol (NORMODYNE) 300 MG tablet TAKE TWO TABLETS BY MOUTH THREE TIMES DAILY 180 tablet 6  . NIFEdipine (PROCARDIA-XL/ADALAT CC) 60 MG 24 hr tablet TAKE 1 TABLET BY MOUTH TWICE DAILY 180  tablet 1  . pravastatin (PRAVACHOL) 10 MG tablet Take 1 tablet (10 mg total) at bedtime by mouth. 90 tablet 1  . tamoxifen (NOLVADEX) 20 MG tablet Take 1 tablet (20 mg total) by mouth daily. 90 tablet 3   No current facility-administered medications for this visit.     PHYSICAL EXAMINATION: ECOG PERFORMANCE STATUS: 1 - Symptomatic but completely ambulatory  Vitals:    10/22/17 1005  BP: (!) 169/92  Pulse: 80  Resp: 16  Temp: 98.8 F (37.1 C)  SpO2: 100%   Filed Weights   10/22/17 1005  Weight: 192 lb 14.4 oz (87.5 kg)    GENERAL:alert, no distress and comfortable SKIN: skin color, texture, turgor are normal, no rashes or significant lesions EYES: normal, Conjunctiva are pink and non-injected, sclera clear OROPHARYNX:no exudate, no erythema and lips, buccal mucosa, and tongue normal  NECK: supple, thyroid normal size, non-tender, without nodularity LYMPH:  no palpable lymphadenopathy in the cervical, axillary or inguinal LUNGS: clear to auscultation and percussion with normal breathing effort HEART: regular rate & rhythm and no murmurs and no lower extremity edema ABDOMEN:abdomen soft, non-tender and normal bowel sounds MUSCULOSKELETAL:no cyanosis of digits and no clubbing  NEURO: alert & oriented x 3 with fluent speech, no focal motor/sensory deficits EXTREMITIES: No lower extremity edema BREAST: No palpable masses or nodules in either right or left breasts. No palpable axillary supraclavicular or infraclavicular adenopathy no breast tenderness or nipple discharge. (exam performed in the presence of a chaperone)  LABORATORY DATA:  I have reviewed the data as listed   Chemistry      Component Value Date/Time   NA 140 05/26/2017 0932   NA 141 04/08/2016 0904   K 3.6 05/26/2017 0932   K 4.7 04/08/2016 0904   CL 103 05/26/2017 0932   CO2 25 05/26/2017 0932   CO2 26 04/08/2016 0904   BUN 14 05/26/2017 0932   BUN 16.3 04/08/2016 0904   CREATININE 1.29 (H) 05/26/2017 0932   CREATININE 1.5 (H) 04/08/2016 0904      Component Value Date/Time   CALCIUM 9.7 05/26/2017 0932   CALCIUM 10.0 04/08/2016 0904   ALKPHOS 81 05/26/2017 0932   ALKPHOS 90 04/08/2016 0904   AST 23 05/26/2017 0932   AST 25 04/08/2016 0904   ALT 19 05/26/2017 0932   ALT 23 04/08/2016 0904   BILITOT 0.5 05/26/2017 0932   BILITOT 0.58 04/08/2016 0904       Lab  Results  Component Value Date   WBC 4.2 05/26/2017   HGB 12.4 05/26/2017   HCT 37.5 05/26/2017   MCV 93.3 05/26/2017   PLT 309 05/26/2017   NEUTROABS 2,562 05/26/2017    ASSESSMENT & PLAN:  Breast cancer of upper-outer quadrant of left female breast ([place]) 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, 1.3 cm, with DCIS, margins negative, 0/4 lymph nodes negative, T1 cN0 stage IA pathologic stage, ER 100%, PR 100%, HER-2 negative ratio 1.39, Ki-67 20% (Originally 2 nodules were detected on screening mammogram 1.9 cm= fat necrosis and 8 mm IDC) Oncotype DX score 20, 13% risk of recurrence, intermediate risk Adjuvant radiation therapy started 07/20/2017completed 07/13/2016  Current treatment: Adjuvant antiestrogen therapy with anastrozole started 07/23/2016 but switched to letrozole in June 2018 due to hair loss, switched to tamoxifen 10/22/2017 due to continuing her loss with letrozole  Letrozole toxicities: Continues to have hair loss issues.  Because of this we will switch her to tamoxifen. Patient will start to take 10 mg tamoxifen if she tolerates  it well then she will increase it to 20 mg daily.  Tamoxifen counseling:We discussed the risks and benefits of tamoxifen. These include but not limited to insomnia, hot flashes, mood changes, vaginal dryness, and weight gain. Although rare, serious side effects including endometrial cancer, risk of blood clots were also discussed. We strongly believe that the benefits far outweigh the risks. Patient understands these risks and consented to starting treatment. Planned treatment duration is 5 years.  Surveillance: 1. Breast exam 04/22/2017: Benign 2. Mammogram was ordered but not performed I instructed her that she needs to get a mammogram completed  MRI brain: 02/27/2017: No acute intracranial abnormality, remote hemorrhage infarct left basal ganglia the lungs extensive periventricular and subcortical white matter disease nonspecific.  RTC one  year   I spent 25 minutes talking to the patient of which more than half was spent in counseling and coordination of care.  No orders of the defined types were placed in this encounter.  The patient has a good understanding of the overall plan. she agrees with it. she will call with any problems that may develop before the next visit here.   Rulon Eisenmenger, MD 10/22/17

## 2017-10-22 NOTE — Assessment & Plan Note (Addendum)
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, 1.3 cm, with DCIS, margins negative, 0/4 lymph nodes negative, T1 cN0 stage IA pathologic stage, ER 100%, PR 100%, HER-2 negative ratio 1.39, Ki-67 20% (Originally 2 nodules were detected on screening mammogram 1.9 cm= fat necrosis and 8 mm IDC) Oncotype DX score 20, 13% risk of recurrence, intermediate risk Adjuvant radiation therapy started 07/20/2017completed 07/13/2016  Current treatment: Adjuvant antiestrogen therapy with anastrozole started 07/23/2016 but switched to letrozole in June 2018 due to hair loss  Letrozole toxicities: Denies any side effects.   Hypertension:  Surveillance: 1. Breast exam 04/22/2017: Benign 2. Mammogram was ordered but not performed  MRI brain: 02/27/2017: No acute intracranial abnormality, remote hemorrhage infarct left basal ganglia the lungs extensive periventricular and subcortical white matter disease nonspecific.  RTC one year

## 2017-11-24 ENCOUNTER — Ambulatory Visit (INDEPENDENT_AMBULATORY_CARE_PROVIDER_SITE_OTHER): Payer: Medicare HMO | Admitting: Internal Medicine

## 2017-11-24 ENCOUNTER — Ambulatory Visit (INDEPENDENT_AMBULATORY_CARE_PROVIDER_SITE_OTHER): Payer: Medicare HMO

## 2017-11-24 ENCOUNTER — Encounter: Payer: Self-pay | Admitting: Internal Medicine

## 2017-11-24 VITALS — BP 138/80 | HR 84 | Temp 98.5°F | Ht 63.0 in | Wt 200.0 lb

## 2017-11-24 VITALS — BP 138/58 | HR 84 | Temp 98.5°F | Ht 63.0 in | Wt 200.0 lb

## 2017-11-24 DIAGNOSIS — M25512 Pain in left shoulder: Secondary | ICD-10-CM | POA: Diagnosis not present

## 2017-11-24 DIAGNOSIS — G4700 Insomnia, unspecified: Secondary | ICD-10-CM

## 2017-11-24 DIAGNOSIS — Z23 Encounter for immunization: Secondary | ICD-10-CM

## 2017-11-24 DIAGNOSIS — I10 Essential (primary) hypertension: Secondary | ICD-10-CM

## 2017-11-24 DIAGNOSIS — Z0000 Encounter for general adult medical examination without abnormal findings: Secondary | ICD-10-CM

## 2017-11-24 DIAGNOSIS — E782 Mixed hyperlipidemia: Secondary | ICD-10-CM | POA: Diagnosis not present

## 2017-11-24 DIAGNOSIS — I6930 Unspecified sequelae of cerebral infarction: Secondary | ICD-10-CM | POA: Diagnosis not present

## 2017-11-24 DIAGNOSIS — H8110 Benign paroxysmal vertigo, unspecified ear: Secondary | ICD-10-CM | POA: Diagnosis not present

## 2017-11-24 DIAGNOSIS — Z853 Personal history of malignant neoplasm of breast: Secondary | ICD-10-CM | POA: Diagnosis not present

## 2017-11-24 LAB — COMPLETE METABOLIC PANEL WITH GFR
AG Ratio: 1.9 (calc) (ref 1.0–2.5)
ALT: 19 U/L (ref 6–29)
AST: 21 U/L (ref 10–35)
Albumin: 4.6 g/dL (ref 3.6–5.1)
Alkaline phosphatase (APISO): 76 U/L (ref 33–130)
BUN/Creatinine Ratio: 10 (calc) (ref 6–22)
BUN: 12 mg/dL (ref 7–25)
CO2: 27 mmol/L (ref 20–32)
Calcium: 9.9 mg/dL (ref 8.6–10.4)
Chloride: 103 mmol/L (ref 98–110)
Creat: 1.15 mg/dL — ABNORMAL HIGH (ref 0.50–0.99)
GFR, Est African American: 59 mL/min/{1.73_m2} — ABNORMAL LOW (ref >=60)
GFR, Est Non African American: 51 mL/min/{1.73_m2} — ABNORMAL LOW (ref >=60)
Globulin: 2.4 g/dL (calc) (ref 1.9–3.7)
Glucose, Bld: 98 mg/dL (ref 65–99)
Potassium: 4.3 mmol/L (ref 3.5–5.3)
Sodium: 140 mmol/L (ref 135–146)
Total Bilirubin: 0.5 mg/dL (ref 0.2–1.2)
Total Protein: 7 g/dL (ref 6.1–8.1)

## 2017-11-24 LAB — CBC WITH DIFFERENTIAL/PLATELET
Basophils Absolute: 22 cells/uL (ref 0–200)
Basophils Relative: 0.5 %
Eosinophils Absolute: 62 cells/uL (ref 15–500)
Eosinophils Relative: 1.4 %
HCT: 36.1 % (ref 35.0–45.0)
Hemoglobin: 12.3 g/dL (ref 11.7–15.5)
Lymphs Abs: 1245 cells/uL (ref 850–3900)
MCH: 30.8 pg (ref 27.0–33.0)
MCHC: 34.1 g/dL (ref 32.0–36.0)
MCV: 90.5 fL (ref 80.0–100.0)
MPV: 10.2 fL (ref 7.5–12.5)
Monocytes Relative: 8.4 %
Neutro Abs: 2702 cells/uL (ref 1500–7800)
Neutrophils Relative %: 61.4 %
Platelets: 321 10*3/uL (ref 140–400)
RBC: 3.99 10*6/uL (ref 3.80–5.10)
RDW: 12.8 % (ref 11.0–15.0)
Total Lymphocyte: 28.3 %
WBC mixed population: 370 cells/uL (ref 200–950)
WBC: 4.4 10*3/uL (ref 3.8–10.8)

## 2017-11-24 LAB — LIPID PANEL
CHOL/HDL RATIO: 2.3 (calc) (ref <5.0)
Cholesterol: 178 mg/dL (ref <200)
HDL: 77 mg/dL (ref >50)
LDL CHOLESTEROL (CALC): 82 mg/dL
Non-HDL Cholesterol (Calc): 101 mg/dL (calc) (ref <130)
Triglycerides: 98 mg/dL (ref <150)

## 2017-11-24 MED ORDER — ESZOPICLONE 2 MG PO TABS: 2.0000 mg | ORAL_TABLET | Freq: Every evening | ORAL | 0 refills | Status: DC | PRN

## 2017-11-24 MED ORDER — TETANUS-DIPHTH-ACELL PERTUSSIS 5-2.5-18.5 LF-MCG/0.5 IM SUSP: 0.5000 mL | Freq: Once | INTRAMUSCULAR | 0 refills | Status: AC

## 2017-11-24 NOTE — Patient Instructions (Addendum)
Continue current medications as ordered  Reassurance given for left shoulder pain. May need xray +/- Physical therapy if if does not improve  Follow up with specialists as scheduled  Influenza vaccine given today  Will call with lab results  Follow up in 3 mos for HTN, hyperlipidemia, hx breast cancer, left shoulder pain

## 2017-11-24 NOTE — Patient Instructions (Signed)
Michele Page , Thank you for taking time to come for your Medicare Wellness Visit. I appreciate your ongoing commitment to your health goals. Please review the following plan we discussed and let me know if I can assist you in the future.   Screening recommendations/referrals: Colonoscopy up to date, due 07/07/2018 Mammogram up to date. Due 03/31/2018 Bone Density due at age 62 Recommended yearly ophthalmology/optometry visit for glaucoma screening and checkup Recommended yearly dental visit for hygiene and checkup  Vaccinations: Influenza vaccine due, declined Pneumococcal vaccine due at age 45 Tdap vaccine due, prescription sent to pharmacy Shingles vaccine due at age 43    Advanced directives: Please fill out your living will and health care power of attorney and give [place] a copy  Conditions/risks identified: none  Next appointment: Dr. Eulas Post 11/24/2017 @ 9:15am             Tyson Dense, RN 11/25/2018 @ 9:15am  Preventive Care 40-64 Years, Female Preventive care refers to lifestyle choices and visits with your health care provider that can promote health and wellness. What does preventive care include?  A yearly physical exam. This is also called an annual well check.  Dental exams once or twice a year.  Routine eye exams. Ask your health care provider how often you should have your eyes checked.  Personal lifestyle choices, including:  Daily care of your teeth and gums.  Regular physical activity.  Eating a healthy diet.  Avoiding tobacco and drug use.  Limiting alcohol use.  Practicing safe sex.  Taking low-dose aspirin daily starting at age 44.  Taking vitamin and mineral supplements as recommended by your health care provider. What happens during an annual well check? The services and screenings done by your health care provider during your annual well check will depend on your age, overall health, lifestyle risk factors, and family history of disease. Counseling    Your health care provider may ask you questions about your:  Alcohol use.  Tobacco use.  Drug use.  Emotional well-being.  Home and relationship well-being.  Sexual activity.  Eating habits.  Work and work Statistician.  Method of birth control.  Menstrual cycle.  Pregnancy history. Screening  You may have the following tests or measurements:  Height, weight, and BMI.  Blood pressure.  Lipid and cholesterol levels. These may be checked every 5 years, or more frequently if you are over 59 years old.  Skin check.  Lung cancer screening. You may have this screening every year starting at age 23 if you have a 30-pack-year history of smoking and currently smoke or have quit within the past 15 years.  Fecal occult blood test (FOBT) of the stool. You may have this test every year starting at age 5.  Flexible sigmoidoscopy or colonoscopy. You may have a sigmoidoscopy every 5 years or a colonoscopy every 10 years starting at age 13.  Hepatitis C blood test.  Hepatitis B blood test.  Sexually transmitted disease (STD) testing.  Diabetes screening. This is done by checking your blood sugar (glucose) after you have not eaten for a while (fasting). You may have this done every 1-3 years.  Mammogram. This may be done every 1-2 years. Talk to your health care provider about when you should start having regular mammograms. This may depend on whether you have a family history of breast cancer.  BRCA-related cancer screening. This may be done if you have a family history of breast, ovarian, tubal, or peritoneal cancers.  Pelvic exam  and Pap test. This may be done every 3 years starting at age 59. Starting at age 29, this may be done every 5 years if you have a Pap test in combination with an HPV test.  Bone density scan. This is done to screen for osteoporosis. You may have this scan if you are at high risk for osteoporosis. Discuss your test results, treatment options, and if  necessary, the need for more tests with your health care provider. Vaccines  Your health care provider may recommend certain vaccines, such as:  Influenza vaccine. This is recommended every year.  Tetanus, diphtheria, and acellular pertussis (Tdap, Td) vaccine. You may need a Td booster every 10 years.  Zoster vaccine. You may need this after age 38.  Pneumococcal 13-valent conjugate (PCV13) vaccine. You may need this if you have certain conditions and were not previously vaccinated.  Pneumococcal polysaccharide (PPSV23) vaccine. You may need one or two doses if you smoke cigarettes or if you have certain conditions. Talk to your health care provider about which screenings and vaccines you need and how often you need them. This information is not intended to replace advice given to you by your health care provider. Make sure you discuss any questions you have with your health care provider. Document Released: 11/29/2015 Document Revised: 07/22/2016 Document Reviewed: 09/03/2015 Elsevier Interactive Patient Education  2017 [place] Prevention in the Home Falls can cause injuries. They can happen to people of all ages. There are many things you can do to make your home safe and to help prevent falls. What can I do on the outside of my home?  Regularly fix the edges of walkways and driveways and fix any cracks.  Remove anything that might make you trip as you walk through a door, such as a raised step or threshold.  Trim any bushes or trees on the path to your home.  Use bright outdoor lighting.  Clear any walking paths of anything that might make someone trip, such as rocks or tools.  Regularly check to see if handrails are loose or broken. Make sure that both sides of any steps have handrails.  Any raised decks and porches should have guardrails on the edges.  Have any leaves, snow, or ice cleared regularly.  Use sand or salt on walking paths during  winter.  Clean up any spills in your garage right away. This includes oil or grease spills. What can I do in the bathroom?  Use night lights.  Install grab bars by the toilet and in the tub and shower. Do not use towel bars as grab bars.  Use non-skid mats or decals in the tub or shower.  If you need to sit down in the shower, use a plastic, non-slip stool.  Keep the floor dry. Clean up any water that spills on the floor as soon as it happens.  Remove soap buildup in the tub or shower regularly.  Attach bath mats securely with double-sided non-slip rug tape.  Do not have throw rugs and other things on the floor that can make you trip. What can I do in the bedroom?  Use night lights.  Make sure that you have a light by your bed that is easy to reach.  Do not use any sheets or blankets that are too big for your bed. They should not hang down onto the floor.  Have a firm chair that has side arms. You can use this for support while  you get dressed.  Do not have throw rugs and other things on the floor that can make you trip. What can I do in the kitchen?  Clean up any spills right away.  Avoid walking on wet floors.  Keep items that you use a lot in easy-to-reach places.  If you need to reach something above you, use a strong step stool that has a grab bar.  Keep electrical cords out of the way.  Do not use floor polish or wax that makes floors slippery. If you must use wax, use non-skid floor wax.  Do not have throw rugs and other things on the floor that can make you trip. What can I do with my stairs?  Do not leave any items on the stairs.  Make sure that there are handrails on both sides of the stairs and use them. Fix handrails that are broken or loose. Make sure that handrails are as long as the stairways.  Check any carpeting to make sure that it is firmly attached to the stairs. Fix any carpet that is loose or worn.  Avoid having throw rugs at the top or  bottom of the stairs. If you do have throw rugs, attach them to the floor with carpet tape.  Make sure that you have a light switch at the top of the stairs and the bottom of the stairs. If you do not have them, ask someone to add them for you. What else can I do to help prevent falls?  Wear shoes that:  Do not have high heels.  Have rubber bottoms.  Are comfortable and fit you well.  Are closed at the toe. Do not wear sandals.  If you use a stepladder:  Make sure that it is fully opened. Do not climb a closed stepladder.  Make sure that both sides of the stepladder are locked into place.  Ask someone to hold it for you, if possible.  Clearly mark and make sure that you can see:  Any grab bars or handrails.  First and last steps.  Where the edge of each step is.  Use tools that help you move around (mobility aids) if they are needed. These include:  Canes.  Walkers.  Scooters.  Crutches.  Turn on the lights when you go into a dark area. Replace any light bulbs as soon as they burn out.  Set up your furniture so you have a clear path. Avoid moving your furniture around.  If any of your floors are uneven, fix them.  If there are any pets around you, be aware of where they are.  Review your medicines with your doctor. Some medicines can make you feel dizzy. This can increase your chance of falling. Ask your doctor what other things that you can do to help prevent falls. This information is not intended to replace advice given to you by your health care provider. Make sure you discuss any questions you have with your health care provider. Document Released: 08/29/2009 Document Revised: 04/09/2016 Document Reviewed: 12/07/2014 Elsevier Interactive Patient Education  2017 [employer].

## 2017-11-24 NOTE — Progress Notes (Signed)
Patient ID: Michele Page, female   DOB: 07/17/1956, 62 y.o.   MRN: [id number]   Location:  [facility] OFFICE  Provider: DR Arletha Grippe  Code Status: FULL CODE Goals of Care:  Advanced Directives 11/24/2017  Does Patient Have a Medical Advance Directive? No  Would patient like information on creating a medical advance directive? No - Patient declined     Chief Complaint  Patient presents with  . Medical Management of Chronic Issues    3 month follow-up CVA, HTN, GERD, Breast Cancer, and Hyperlipidemia. AWV completed today   . Immunizations    Discuss need for [id number]   . Medication Management    Discuss sleeping medication     HPI: Patient is a 62 y.o. female seen today for medical management of chronic diseases.  She is c/a twitching sensation in left arm intermittently x 1 month. No known trauma. She has not changed lifestyle. No heavy lifting or exertion. Arm occasionally throbs. No muscle weakness or swelling. No numbness but has some tingling. Objects can "slip" out of hand if holding them for prolonged time.   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 She does have occasional episode of dizziness with rising form seated to standing position. She did get vestibular rehab exercises from PT but has not needed to perform them.  Left knee pain - stable. She did f/u with Ortho for 2nd opinion and had fluid aspirated which helped. She wears a knee brace prn which also helps. In the past, she has rec'd knee injection by Ortho that she states made her pain worse and she has gait dysfunction due to knee. She no longer takes any medication for it. MRI done in March 2018 revealed large joint effusion and tricompartmental cartilage abnormalities, and lateral femoral condyle friction syndrome.   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 She completed XRT in June 2018. She was told no chemotx needed. On femara since 04/2017. Arimidex caused hair loss.  Followed by Dr Lindi Adie  Insomnia - stable on prn lunesta. Needs new Rx today  HTN - controlled on labetalol and procardia. Followed by cardio Dr Gwenlyn Found. She takes ASA daily  Hyperlipidemia - stable on pravastatin. LDL 88. No myalgias  COPD - no exacerbations. No issues with breathing  Hx CVA - stable. Takes statin and ASA daily. LDL 88. Followed by Dr Felecia Shelling. MRI brain in Apr 2018 revealed remote hemorrhagic infarct in left basal ganglia and thalamus.  MS - asymptomatic.  no diplopia but she cannot "stare" for prolonged time without blinking. Followed by Neurology Dr Felecia Shelling. No exacerbations. 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  Gastritis/GERD - stable off nexium.  She had a colonoscopy last yr and tubular adenoma polyps removed. No acid reflux sx's.  CKD - stage 3. probably due to HTN. Cr 1.29    Past Medical History:  Diagnosis Date  . Arthritis    lt knee  . Breast cancer ([place])   . Breast cancer of upper-outer quadrant of left female breast ([place]) 04/02/2016  . Colon polyps   . COPD (chronic obstructive pulmonary disease) ([place])   . Hyperlipidemia   . Hypertension   . Multiple sclerosis ([place])   . Stroke ([place])    2013, mild cognitive deficits  . Vision abnormalities     Past Surgical History:  Procedure Laterality Date  . HYSTEROSCOPY W/D&C N/A 12/19/2015  Procedure: DILATATION AND CURETTAGE /HYSTEROSCOPY;  Surgeon: Linda Hedges, DO;  Location: [facility];  Service: Gynecology;  Laterality: N/A;  . RADIOACTIVE SEED GUIDED PARTIAL MASTECTOMY WITH AXILLARY SENTINEL LYMPH NODE BIOPSY Left 04/17/2016   Procedure: RADIOACTIVE SEED GUIDED PARTIAL MASTECTOMY WITH AXILLARY SENTINEL LYMPH NODE BIOPSY;  Surgeon: Erroll Luna, MD;  Location: [facility];  Service: General;  Laterality: Left;  RADIOACTIVE SEED GUIDED PARTIAL MASTECTOMY WITH AXILLARY SENTINEL LYMPH NODE BIOPSY   . TUBAL LIGATION    . VAGINAL  DELIVERY     x3     reports that she quit smoking about 13 years ago. Her smoking use included cigarettes. She has a 12.50 pack-year smoking history. she has never used smokeless tobacco. She reports that she drinks alcohol. She reports that she does not use drugs. Social History   Socioeconomic History  . Marital status: Married    Spouse name: Not on file  . Number of children: 3  . Years of education: Not on file  . Highest education level: Not on file  Social Needs  . Financial resource strain: Not hard at all  . Food insecurity - worry: Never true  . Food insecurity - inability: Never true  . Transportation needs - medical: No  . Transportation needs - non-medical: No  Occupational History  . Occupation: retired  Tobacco Use  . Smoking status: Former Smoker    Packs/day: 0.50    Years: 25.00    Pack years: 12.50    Types: Cigarettes    Last attempt to quit: 11/16/2004    Years since quitting: 13.0  . Smokeless tobacco: Never Used  Substance and Sexual Activity  . Alcohol use: Yes    Alcohol/week: 0.0 oz    Comment: occas  . Drug use: No  . Sexual activity: No  Other Topics Concern  . Not on file  Social History Narrative   Diet- N/A   Caffeine- Yes   Married- Yes   House- 2 story with 2 people   Pets- No   Current/past profession- Nurse, mental health   Exercise- Yes   Living will-No   DNR-N/A   POA/HPOA-No       Family History  Problem Relation Age of Onset  . Stroke Sister   . Hypertension Mother   . Stroke Mother   . Alcohol abuse Brother   . HIV/AIDS Brother     No Known Allergies  Outpatient Encounter Medications as of 11/24/2017  Medication Sig  . aspirin EC 81 MG tablet Take 1 tablet (81 mg total) by mouth daily.  . eszopiclone (LUNESTA) 2 MG TABS tablet Take 1 tablet (2 mg total) by mouth at bedtime as needed for sleep. Take immediately before bedtime  . labetalol (NORMODYNE) 300 MG tablet TAKE TWO TABLETS BY MOUTH THREE TIMES DAILY  .  NIFEdipine (PROCARDIA-XL/ADALAT CC) 60 MG 24 hr tablet TAKE 1 TABLET BY MOUTH TWICE DAILY  . pravastatin (PRAVACHOL) 10 MG tablet Take 1 tablet (10 mg total) at bedtime by mouth.  . tamoxifen (NOLVADEX) 20 MG tablet Take 1 tablet (20 mg total) by mouth daily.   No facility-administered encounter medications on file as of 11/24/2017.     Review of Systems:  Review of Systems  Musculoskeletal: Positive for arthralgias and myalgias.  Neurological: Positive for dizziness.  Psychiatric/Behavioral: Positive for sleep disturbance.  All other systems reviewed and are negative.   Health Maintenance  Topic Date Due  . INFLUENZA VACCINE  11/16/2018 (Originally 06/16/2017)  .  PAP SMEAR  11/16/2018 (Originally 08/03/1976)  . HIV Screening  11/16/2018 (Originally 08/03/1970)  . Hepatitis C Screening  11/24/2018 (Originally 02/22/1956)  . TETANUS/TDAP  11/17/2019 (Originally 08/03/1974)  . MAMMOGRAM  03/31/2018  . COLONOSCOPY  07/07/2018    Physical Exam: Vitals:   11/24/17 0848  BP: 138/80  Pulse: 84  Temp: 98.5 F (36.9 C)  TempSrc: Oral  SpO2: 95%  Weight: 200 lb (90.7 kg)  Height: 5' 3"  (1.6 m)   Body mass index is 35.43 kg/m. Physical Exam  Constitutional: She is oriented to person, place, and time. She appears well-developed and well-nourished.  HENT:  Mouth/Throat: Oropharynx is clear and moist. No oropharyngeal exudate.  MMM; no oral thrush  Eyes: Pupils are equal, round, and reactive to light. No scleral icterus.  Neck: Neck supple. Carotid bruit is not present. No tracheal deviation present. No thyromegaly present.  Cardiovascular: Normal rate, regular rhythm, normal heart sounds and intact distal pulses. Exam reveals no gallop and no friction rub.  No murmur heard. No LE edema b/l. no calf TTP.   Pulmonary/Chest: Effort normal and breath sounds normal. No stridor. No respiratory distress. She has no wheezes. She has no rales. She exhibits no tenderness.  Abdominal: Soft. Normal  appearance and bowel sounds are normal. She exhibits no distension and no mass. There is no hepatomegaly. There is no tenderness. There is no rigidity, no rebound and no guarding. No hernia.  Musculoskeletal: She exhibits edema and tenderness. She exhibits no deformity.       Left shoulder: She exhibits tenderness (coracoid process), pain, spasm and decreased strength. She exhibits normal range of motion, no bony tenderness, no swelling, no crepitus and no deformity.       Arms: Ropy tissue texture changes with TTP at left axillary fold   Lymphadenopathy:    She has no cervical adenopathy.  Neurological: She is alert and oriented to person, place, and time.  Skin: Skin is warm and dry. No rash noted.  Psychiatric: She has a normal mood and affect. Her behavior is normal. Judgment and thought content normal.    Labs reviewed: Basic Metabolic Panel: Recent Labs    12/18/16 0001 05/26/17 0932  NA 140 140  K 3.9 3.6  CL 105 103  CO2 25 25  GLUCOSE 120* 94  BUN 17 14  CREATININE 1.49* 1.29*  CALCIUM 9.2 9.7  TSH  --  1.37   Liver Function Tests: Recent Labs    12/18/16 0001 05/26/17 0932  AST 19 23  ALT 15 19  ALKPHOS 74 81  BILITOT 0.4 0.5  PROT 6.6 6.9  ALBUMIN 4.3 4.6   No results for input(s): LIPASE, AMYLASE in the last 8760 hours. No results for input(s): AMMONIA in the last 8760 hours. CBC: Recent Labs    05/26/17 0932  WBC 4.2  NEUTROABS 2,562  HGB 12.4  HCT 37.5  MCV 93.3  PLT 309   Lipid Panel: Recent Labs    12/18/16 0001 05/26/17 0932  CHOL 161 175  HDL 57 65  LDLCALC 84 88  TRIG 98 108  CHOLHDL 2.8 2.7   No results found for: HGBA1C  Procedures since last visit: No results found.  Assessment/Plan   ICD-10-CM   1. Left shoulder pain, unspecified chronicity M25.512   2. History of stroke with residual deficit I69.30   3. Benign paroxysmal positional vertigo, unspecified laterality H81.10   4. Essential hypertension I10 CMP with eGFR     CBC with Differential/Platelets  5. History of breast cancer Z85.3 CMP with eGFR    CBC with Differential/Platelets  6. Mixed hyperlipidemia E78.2 Lipid Panel  7. Insomnia, unspecified type G47.00 eszopiclone (LUNESTA) 2 MG TABS tablet  8. Need for immunization against influenza Z23 Flu Vaccine QUAD 6+ mos PF IM (Fluarix Quad PF)   Continue current medications as ordered  Reassurance given for left shoulder pain. May need xray +/- Physical therapy if if does not improve  Follow up with specialists as scheduled  Influenza vaccine given today  Will call with lab results  Follow up in 3 mos for HTN, hyperlipidemia, hx breast cancer, left shoulder pain    Giovonnie Trettel S. Perlie Gold  [facility] [address] [place], [place] [address] [phone] Cell (Monday-Friday 8 AM - 5 PM) [phone] After 5 PM and follow prompts

## 2017-11-24 NOTE — Progress Notes (Signed)
Subjective:   Michele Page is a 62 y.o. female who presents for Medicare Annual (Subsequent) preventive examination.  Last AWV-12/11/2016    Objective:     Vitals: BP (!) 138/58 (BP Location: Right Arm, Patient Position: Sitting)   Pulse 84   Temp 98.5 F (36.9 C) (Oral)   Ht 5\' 3"  (1.6 m)   Wt 200 lb (90.7 kg)   SpO2 95%   BMI 35.43 kg/m   Body mass index is 35.43 kg/m.  Advanced Directives 11/24/2017 05/21/2017 04/22/2017 03/04/2017 02/15/2017 12/22/2016 12/11/2016  Does Patient Have a Medical Advance Directive? No No No No No No No  Would patient like information on creating a medical advance directive? No - Patient declined No - Patient declined - No - Patient declined No - Patient declined - No - Patient declined    Tobacco Social History   Tobacco Use  Smoking Status Former Smoker  . Packs/day: 0.50  . Years: 25.00  . Pack years: 12.50  . Types: Cigarettes  . Last attempt to quit: 11/16/2004  . Years since quitting: 13.0  Smokeless Tobacco Never Used     Counseling given: Not Answered   Clinical Intake:  Pre-visit preparation completed: No  Pain : 0-10 Pain Type: Acute pain Pain Location: Arm Pain Orientation: Left Pain Descriptors / Indicators: Throbbing Pain Onset: 1 to 4 weeks ago Pain Frequency: Intermittent     Diabetes: No  How often do you need to have someone help you when you read instructions, pamphlets, or other written materials from your doctor or pharmacy?: 1 - Never What is the last grade level you completed in school?: 3 years of college  Interpreter Needed?: No  Information entered by :: Tyson Dense, Rn  Past Medical History:  Diagnosis Date  . Arthritis    lt knee  . Breast cancer ([place])   . Breast cancer of upper-outer quadrant of left female breast ([place]) 04/02/2016  . Colon polyps   . COPD (chronic obstructive pulmonary disease) ([place])   . Hyperlipidemia   . Hypertension   . Multiple sclerosis ([place])   . Stroke ([place])    2013, mild  cognitive deficits  . Vision abnormalities    Past Surgical History:  Procedure Laterality Date  . HYSTEROSCOPY W/D&C N/A 12/19/2015   Procedure: DILATATION AND CURETTAGE /HYSTEROSCOPY;  Surgeon: Linda Hedges, DO;  Location: [facility];  Service: Gynecology;  Laterality: N/A;  . RADIOACTIVE SEED GUIDED PARTIAL MASTECTOMY WITH AXILLARY SENTINEL LYMPH NODE BIOPSY Left 04/17/2016   Procedure: RADIOACTIVE SEED GUIDED PARTIAL MASTECTOMY WITH AXILLARY SENTINEL LYMPH NODE BIOPSY;  Surgeon: Erroll Luna, MD;  Location: [facility];  Service: General;  Laterality: Left;  RADIOACTIVE SEED GUIDED PARTIAL MASTECTOMY WITH AXILLARY SENTINEL LYMPH NODE BIOPSY   . TUBAL LIGATION    . VAGINAL DELIVERY     x3   Family History  Problem Relation Age of Onset  . Stroke Sister   . Hypertension Mother   . Stroke Mother   . Alcohol abuse Brother   . HIV/AIDS Brother    Social History   Socioeconomic History  . Marital status: Married    Spouse name: None  . Number of children: 3  . Years of education: None  . Highest education level: None  Social Needs  . Financial resource strain: Not hard at all  . Food insecurity - worry: Never true  . Food insecurity - inability: Never true  . Transportation needs - medical: No  . Transportation needs -  non-medical: No  Occupational History  . Occupation: retired  Tobacco Use  . Smoking status: Former Smoker    Packs/day: 0.50    Years: 25.00    Pack years: 12.50    Types: Cigarettes    Last attempt to quit: 11/16/2004    Years since quitting: 13.0  . Smokeless tobacco: Never Used  Substance and Sexual Activity  . Alcohol use: Yes    Alcohol/week: 0.0 oz    Comment: occas  . Drug use: No  . Sexual activity: No  Other Topics Concern  . None  Social History Narrative   Diet- N/A   Caffeine- Yes   Married- Yes   House- 2 story with 2 people   Pets- No   Current/past profession- Insurance underwriter daycare   Exercise- Yes   Living will-No    DNR-N/A   POA/HPOA-No       Outpatient Encounter Medications as of 11/24/2017  Medication Sig  . aspirin EC 81 MG tablet Take 1 tablet (81 mg total) by mouth daily.  . eszopiclone (LUNESTA) 2 MG TABS tablet Take 1 tablet (2 mg total) by mouth at bedtime as needed for sleep. Take immediately before bedtime  . labetalol (NORMODYNE) 300 MG tablet TAKE TWO TABLETS BY MOUTH THREE TIMES DAILY  . NIFEdipine (PROCARDIA-XL/ADALAT CC) 60 MG 24 hr tablet TAKE 1 TABLET BY MOUTH TWICE DAILY  . pravastatin (PRAVACHOL) 10 MG tablet Take 1 tablet (10 mg total) at bedtime by mouth.  . tamoxifen (NOLVADEX) 20 MG tablet Take 1 tablet (20 mg total) by mouth daily.   No facility-administered encounter medications on file as of 11/24/2017.     Activities of Daily Living In your present state of health, do you have any difficulty performing the following activities: 11/24/2017 12/11/2016  Hearing? N N  Vision? N N  Difficulty concentrating or making decisions? N Y  Comment - Difficulty remembering  Walking or climbing stairs? N N  Dressing or bathing? N N  Doing errands, shopping? N N  Comment - Pt still driving vehicle.   Preparing Food and eating ? N N  Using the Toilet? N N  In the past six months, have you accidently leaked urine? N N  Do you have problems with loss of bowel control? N N  Managing your Medications? N N  Managing your Finances? N N  Housekeeping or managing your Housekeeping? N N  Some recent data might be hidden    Patient Care Team: Gildardo Cranker, DO as PCP - General (Internal Medicine) Nicholas Lose, MD as Consulting Physician (Oncology) Erroll Luna, MD as Consulting Physician (General Surgery) Thea Silversmith, MD (Inactive) as Consulting Physician (Radiation Oncology)    Assessment:   This is a routine wellness examination for Michele Page.  Exercise Activities and Dietary recommendations Current Exercise Habits: Home exercise routine, Type of exercise: treadmill, Time  (Minutes): > 60, Frequency (Times/Week): 2, Weekly Exercise (Minutes/Week): 0, Intensity: Mild, Exercise limited by: None identified  Goals    None      Fall Risk Fall Risk  11/24/2017 08/24/2017 05/21/2017 02/15/2017 12/22/2016  Falls in the past year? No No No No No   Is the patient's home free of loose throw rugs in walkways, pet beds, electrical cords, etc?   no      Grab bars in the bathroom? no      Handrails on the stairs?   yes      Adequate lighting?   yes  Timed Get Up and Go  performed: 16 seconds, fall risk  Depression Screen PHQ 2/9 Scores 11/24/2017 05/21/2017 02/15/2017 12/11/2016  PHQ - 2 Score 0 0 0 0     Cognitive Function within normal limits MMSE - Mini Mental State Exam 12/11/2016  Orientation to time 5  Orientation to Place 4  Registration 3  Attention/ Calculation 5  Recall 2  Language- name 2 objects 2  Language- repeat 1  Language- follow 3 step command 2  Language- read & follow direction 1  Write a sentence 1  Copy design 0  Total score 26        Immunization History  Administered Date(s) Administered  . Influenza,inj,Quad PF,6+ Mos 08/07/2015, 08/06/2016  . PPD Test 07/13/2017    Qualifies for Shingles Vaccine? No, pt is not age 21  Screening Tests Health Maintenance  Topic Date Due  . Hepatitis C Screening  [birth year]  . INFLUENZA VACCINE  11/16/2018 (Originally 06/16/2017)  . PAP SMEAR  11/16/2018 (Originally 08/03/1976)  . HIV Screening  11/16/2018 (Originally 08/03/1970)  . TETANUS/TDAP  11/17/2019 (Originally 08/03/1974)  . MAMMOGRAM  03/31/2018  . COLONOSCOPY  07/07/2018    Cancer Screenings: Lung: Low Dose CT Chest recommended if Age 44-80 years, 30 pack-year currently smoking OR have quit w/in 15years. Patient does qualify. Breast:  Up to date on Mammogram? Yes   Up to date of Bone Density/Dexa? No Colorectal: up to date  Additional Screenings:  Hepatitis B/HIV/Syphillis: Declined Hepatitis C Screening: Declined     Plan:    I have  personally reviewed and addressed the Medicare Annual Wellness questionnaire and have noted the following in the patient's chart:  A. Medical and social history B. Use of alcohol, tobacco or illicit drugs  C. Current medications and supplements D. Functional ability and status E.  Nutritional status F.  Physical activity G. Advance directives H. List of other physicians I.  Hospitalizations, surgeries, and ER visits in previous 12 months J.  [place] to include hearing, vision, cognitive, depression L. Referrals and appointments - none  In addition, I have reviewed and discussed with patient certain preventive protocols, quality metrics, and best practice recommendations. A written personalized care plan for preventive services as well as general preventive health recommendations were provided to patient.  See attached scanned questionnaire for additional information.   Signed,   Tyson Dense, RN Nurse Health Advisor   Quick Notes   Health Maintenance: Hep C and HIV screenings, flu vaccine due and declined. TDAP due, prescription sent to pharmacy. Pap smear due, pt stated she will make her next appointment     Abnormal Screen: None     Patient Concerns: None     Nurse Concerns: None

## 2017-11-25 DIAGNOSIS — Z853 Personal history of malignant neoplasm of breast: Secondary | ICD-10-CM | POA: Insufficient documentation

## 2017-11-25 DIAGNOSIS — F5101 Primary insomnia: Secondary | ICD-10-CM | POA: Insufficient documentation

## 2017-11-25 DIAGNOSIS — G4700 Insomnia, unspecified: Secondary | ICD-10-CM | POA: Insufficient documentation

## 2017-11-25 DIAGNOSIS — H8110 Benign paroxysmal vertigo, unspecified ear: Secondary | ICD-10-CM | POA: Insufficient documentation

## 2017-11-25 DIAGNOSIS — M25512 Pain in left shoulder: Secondary | ICD-10-CM | POA: Insufficient documentation

## 2017-12-01 ENCOUNTER — Telehealth: Payer: Self-pay | Admitting: *Deleted

## 2017-12-01 DIAGNOSIS — M25512 Pain in left shoulder: Secondary | ICD-10-CM

## 2017-12-01 NOTE — Telephone Encounter (Signed)
Patient called and stated that she is still having the  Left Shoulder/Left Arm pain and is requesting a X-Ray to be done. Stated that she has seen you for this. Reviewed last OV note. Patient also stated that she is starting to get stiffness in the Right shoulder. Please Advise.

## 2017-12-01 NOTE — Telephone Encounter (Signed)
Order placed. Patient notified.  

## 2017-12-01 NOTE — Telephone Encounter (Signed)
Ok for left shoulder xray for pain

## 2017-12-02 ENCOUNTER — Other Ambulatory Visit: Payer: Medicare HMO

## 2017-12-02 NOTE — Telephone Encounter (Signed)
Patient called and stated that She is at [place] and she stated that it is not her Shoulder that she needs a x-ray on and stated that she saw you for this. OV note 11/24/17 also states Left shoulder pain.  Stated that it is her Left arm. Patient did not want to do the x-ray for shoulder. Please Advise.

## 2017-12-02 NOTE — Telephone Encounter (Signed)
No - it's her shoulder that is the problem, not her arm. She needs to follow through with the shoulder xray as ordered

## 2017-12-02 NOTE — Telephone Encounter (Signed)
Patient notified. LM with Dr. Vale Haven response.

## 2017-12-03 ENCOUNTER — Ambulatory Visit
Admission: RE | Admit: 2017-12-03 | Discharge: 2017-12-03 | Disposition: A | Discharge status: Home / Self Care | Payer: Medicare HMO | Source: Ambulatory Visit | Attending: Internal Medicine | Admitting: Internal Medicine

## 2017-12-03 DIAGNOSIS — M25512 Pain in left shoulder: Secondary | ICD-10-CM | POA: Diagnosis not present

## 2017-12-09 ENCOUNTER — Telehealth: Payer: Self-pay

## 2017-12-09 DIAGNOSIS — M25512 Pain in left shoulder: Secondary | ICD-10-CM

## 2017-12-09 NOTE — Telephone Encounter (Signed)
Spoke with patient, patient states she will think about it and call back next week if she decides to proceed with PT

## 2017-12-09 NOTE — Telephone Encounter (Signed)
Patient called c/o ongoing left shoulder pain. Patient would like to know the plan of care to address her concern since the xray was negative/normal.  Please advise

## 2017-12-09 NOTE — Telephone Encounter (Signed)
Referral PT, may need Ortho eval

## 2018-01-08 ENCOUNTER — Other Ambulatory Visit: Payer: Self-pay | Admitting: Cardiovascular Disease

## 2018-01-11 ENCOUNTER — Ambulatory Visit: Payer: Medicare HMO | Admitting: Nurse Practitioner

## 2018-01-14 ENCOUNTER — Ambulatory Visit (INDEPENDENT_AMBULATORY_CARE_PROVIDER_SITE_OTHER): Payer: Medicare HMO | Admitting: Internal Medicine

## 2018-01-14 ENCOUNTER — Encounter: Payer: Self-pay | Admitting: Internal Medicine

## 2018-01-14 VITALS — BP 132/80 | HR 73 | Temp 98.6°F | Wt 198.0 lb

## 2018-01-14 DIAGNOSIS — J302 Other seasonal allergic rhinitis: Secondary | ICD-10-CM | POA: Diagnosis not present

## 2018-01-14 MED ORDER — MONTELUKAST SODIUM 5 MG PO CHEW: 5.0000 mg | CHEWABLE_TABLET | Freq: Every day | ORAL | 6 refills | Status: DC

## 2018-01-14 NOTE — Patient Instructions (Signed)
START MONTELUKAST (SINGULAIR) 5MG  AT BEDTIME FOR SEASONAL ALLERGY  START OTC SALINE NASAL SPRAY AS NEEDED TO KEEP NOSE MOIST  May take daily claritin, allegra or zyrtec for breakthrough allergy symptoms  Continue other medications as ordered  Follow up as scheduled or sooner if not feeling better   Nasal Allergies Nasal allergies are a reaction to allergens in the air. Allergens are tiny specks (particles) in the air that cause your body to have an allergic reaction. Nasal allergies are not passed from person to person (contagious). They cannot be cured, but they can be controlled. Common causes of nasal allergies include:  Pollen from grasses, trees, and weeds.  House dust mites.  Pet dander.  Mold.  Follow these instructions at home:  Avoid the allergen that is causing your symptoms, if you can.  Keep windows closed. If possible, use air conditioning when there is a lot of pollen in the air.  Do not use fans in your home.  Do not hang clothes outside to dry.  Wear sunglasses to keep pollen out of your eyes.  Wash your hands right away after you touch household pets.  Take over-the-counter and prescription medicines only as told by your doctor.  Keep all follow-up visits as told by your doctor. This is important. Contact a doctor if:  You have a fever.  You have a cough that does not go away (is persistent).  You start to make whistling sounds when you breathe (wheeze).  Your symptoms do not get better with treatment.  You have thick fluid coming from your nose.  You start to have nosebleeds. Get help right away if:  Your tongue or your lips are swollen.  You have trouble breathing.  You feel light-headed or you feel like you are going to pass out (faint).  You have cold sweats. This information is not intended to replace advice given to you by your health care provider. Make sure you discuss any questions you have with your health care  provider. Document Released: 03/04/2011 Document Revised: 04/09/2016 Document Reviewed: 05/15/2015 Elsevier Interactive Patient Education  [employer].

## 2018-01-14 NOTE — Progress Notes (Signed)
Patient ID: Michele Page, female   DOB: 06/16/1956, 62 y.o.   MRN: [id number]   [facility] OFFICE  Provider: DR Arletha Grippe  Code Status: Goals of Care:  Advanced Directives 11/24/2017  Does Patient Have a Medical Advance Directive? No  Would patient like information on creating a medical advance directive? No - Patient declined     Chief Complaint  Patient presents with  . Acute Visit    runny nose x23mths    HPI: Patient is a 62 y.o. female seen today for an acute visit for rhinorrhea. She has a hx MS, CVA and cognitive impairment. She reports no relief with OTC flonase and "another medicine". She has intermittent clear rhinorrhea most of the day but does have nasal congestion by the end of the day. She has not tried OTC antihistamine recently but when she took claritin for a short time about 6 mos ago her sx's did improve. No f/c, HA, sinus pressure/pain, ear pressure/pain, sore throat, post nasal drip. She has chronic dizziness.  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  Past Medical History:  Diagnosis Date  . Arthritis    lt knee  . Breast cancer ([place])   . Breast cancer of upper-outer quadrant of left female breast ([place]) 04/02/2016  . Colon polyps   . COPD (chronic obstructive pulmonary disease) ([place])   . Hyperlipidemia   . Hypertension   . Multiple sclerosis ([place])   . Stroke ([place])    2013, mild cognitive deficits  . Vision abnormalities     Past Surgical History:  Procedure Laterality Date  . HYSTEROSCOPY W/D&C N/A 12/19/2015   Procedure: DILATATION AND CURETTAGE /HYSTEROSCOPY;  Surgeon: Linda Hedges, DO;  Location: [facility];  Service: Gynecology;  Laterality: N/A;  . RADIOACTIVE SEED GUIDED PARTIAL MASTECTOMY WITH AXILLARY SENTINEL LYMPH NODE BIOPSY Left 04/17/2016   Procedure: RADIOACTIVE SEED GUIDED PARTIAL MASTECTOMY WITH AXILLARY SENTINEL LYMPH NODE BIOPSY;  Surgeon: Erroll Luna, MD;  Location: [facility];  Service: General;  Laterality: Left;   RADIOACTIVE SEED GUIDED PARTIAL MASTECTOMY WITH AXILLARY SENTINEL LYMPH NODE BIOPSY   . TUBAL LIGATION    . VAGINAL DELIVERY     x3     reports that she quit smoking about 13 years ago. Her smoking use included cigarettes. She has a 12.50 pack-year smoking history. she has never used smokeless tobacco. She reports that she drinks alcohol. She reports that she does not use drugs. Social History   Socioeconomic History  . Marital status: Married    Spouse name: Not on file  . Number of children: 3  . Years of education: Not on file  . Highest education level: Not on file  Social Needs  . Financial resource strain: Not hard at all  . Food insecurity - worry: Never true  . Food insecurity - inability: Never true  . Transportation needs - medical: No  . Transportation needs - non-medical: No  Occupational History  . Occupation: retired  Tobacco Use  . Smoking status: Former Smoker    Packs/day: 0.50    Years: 25.00    Pack years: 12.50    Types: Cigarettes    Last attempt to quit: 11/16/2004    Years since quitting: 13.1  . Smokeless tobacco: Never Used  Substance and Sexual Activity  . Alcohol use: Yes    Alcohol/week: 0.0 oz    Comment: occas  . Drug use: No  . Sexual activity: No  Other Topics Concern  .  Not on file  Social History Narrative   Diet- N/A   Caffeine- Yes   Married- Yes   House- 2 story with 2 people   Pets- No   Current/past profession- Nurse, mental health   Exercise- Yes   Living will-No   DNR-N/A   POA/HPOA-No       Family History  Problem Relation Age of Onset  . Stroke Sister   . Hypertension Mother   . Stroke Mother   . Alcohol abuse Brother   . HIV/AIDS Brother     No Known Allergies  Outpatient Encounter Medications as of 01/14/2018  Medication Sig  . aspirin EC 81 MG tablet Take 1 tablet (81 mg total) by mouth daily.  . eszopiclone (LUNESTA) 2 MG TABS tablet Take 1 tablet (2 mg total) by mouth at bedtime as needed for sleep. Take  immediately before bedtime  . labetalol (NORMODYNE) 300 MG tablet TAKE TWO TABLETS BY MOUTH THREE TIMES DAILY  . NIFEdipine (PROCARDIA-XL/ADALAT CC) 60 MG 24 hr tablet TAKE 1 TABLET BY MOUTH TWICE DAILY  . pravastatin (PRAVACHOL) 10 MG tablet Take 1 tablet (10 mg total) at bedtime by mouth.  . tamoxifen (NOLVADEX) 20 MG tablet Take 1 tablet (20 mg total) by mouth daily.   No facility-administered encounter medications on file as of 01/14/2018.     Review of Systems:  Review of Systems  HENT: Positive for congestion and rhinorrhea.   All other systems reviewed and are negative.   Health Maintenance  Topic Date Due  . PAP SMEAR  11/16/2018 (Originally 08/03/1976)  . HIV Screening  11/16/2018 (Originally 08/03/1970)  . Hepatitis C Screening  11/24/2018 (Originally May 22, 1956)  . TETANUS/TDAP  11/17/2019 (Originally 08/03/1974)  . MAMMOGRAM  03/31/2018  . COLONOSCOPY  07/07/2018  . INFLUENZA VACCINE  Completed    Physical Exam: Vitals:   01/14/18 1033  BP: 132/80  Pulse: 73  Temp: 98.6 F (37 C)  TempSrc: Oral  SpO2: 96%  Weight: 198 lb (89.8 kg)   Body mass index is 35.07 kg/m. Physical Exam  Constitutional: She appears well-developed and well-nourished.  Looks well in NAD  HENT:  TMs appear nml; left maxillary sinus TTP with boggy tissue texture changes; nares with increased turbinate swelling, red and dry; oropharynx cobblestoning but no redness or exudate. MMM; no oral thrush  Eyes: Pupils are equal, round, and reactive to light. No scleral icterus.  Neck: Neck supple.  Cardiovascular: Normal rate, regular rhythm and intact distal pulses. Exam reveals no gallop and no friction rub.  Murmur (1/6 SEM) heard. Pulmonary/Chest: Effort normal and breath sounds normal. No respiratory distress. She has no wheezes. She has no rales.  No rhonchi  Neurological: She is alert.  Skin: Skin is warm and dry. No rash noted.  Psychiatric: She has a normal mood and affect. Her behavior is  normal. Thought content normal.    Labs reviewed: Basic Metabolic Panel: Recent Labs    05/26/17 0932 11/24/17 0951  NA 140 140  K 3.6 4.3  CL 103 103  CO2 25 27  GLUCOSE 94 98  BUN 14 12  CREATININE 1.29* 1.15*  CALCIUM 9.7 9.9  TSH 1.37  --    Liver Function Tests: Recent Labs    05/26/17 0932 11/24/17 0951  AST 23 21  ALT 19 19  ALKPHOS 81  --   BILITOT 0.5 0.5  PROT 6.9 7.0  ALBUMIN 4.6  --    No results for input(s): LIPASE, AMYLASE in the  last 8760 hours. No results for input(s): AMMONIA in the last 8760 hours. CBC: Recent Labs    05/26/17 0932 11/24/17 0951  WBC 4.2 4.4  NEUTROABS 2,562 2,702  HGB 12.4 12.3  HCT 37.5 36.1  MCV 93.3 90.5  PLT 309 321   Lipid Panel: Recent Labs    05/26/17 0932 11/24/17 0951  CHOL 175 178  HDL 65 77  LDLCALC 88  --   TRIG 108 98  CHOLHDL 2.7 2.3   No results found for: HGBA1C  Procedures since last visit: No results found.  Assessment/Plan   ICD-10-CM   1. Seasonal allergic rhinitis, unspecified trigger - with some component of vasomotor rhinitis J30.2 montelukast (SINGULAIR) 5 MG chewable tablet   START MONTELUKAST (SINGULAIR) 5MG  AT BEDTIME FOR SEASONAL ALLERGY  START OTC SALINE NASAL SPRAY AS NEEDED TO KEEP NOSE MOIST  May take daily claritin, allegra or zyrtec for breakthrough allergy symptoms  T/c dymista if sx's do not improve  Continue other medications as ordered  Follow up as scheduled or sooner if not feeling better  Nasal allergies handout given  Michele Page  [facility] [address] [place], [place] [address] [phone] Cell (Monday-Friday 8 AM - 5 PM) [phone] After 5 PM and follow prompts

## 2018-01-21 ENCOUNTER — Telehealth: Payer: Self-pay | Admitting: Hematology and Oncology

## 2018-01-21 NOTE — Telephone Encounter (Signed)
Patient called in and wanted to see Dr. Lindi Adie.

## 2018-01-25 ENCOUNTER — Other Ambulatory Visit: Payer: Self-pay

## 2018-01-25 ENCOUNTER — Ambulatory Visit: Payer: Medicare HMO | Attending: Internal Medicine | Admitting: Physical Therapy

## 2018-01-25 ENCOUNTER — Encounter: Payer: Self-pay | Admitting: Physical Therapy

## 2018-01-25 DIAGNOSIS — M25612 Stiffness of left shoulder, not elsewhere classified: Secondary | ICD-10-CM | POA: Insufficient documentation

## 2018-01-25 DIAGNOSIS — M25512 Pain in left shoulder: Secondary | ICD-10-CM | POA: Diagnosis not present

## 2018-01-25 DIAGNOSIS — H8111 Benign paroxysmal vertigo, right ear: Secondary | ICD-10-CM | POA: Diagnosis not present

## 2018-01-25 DIAGNOSIS — M6281 Muscle weakness (generalized): Secondary | ICD-10-CM | POA: Diagnosis not present

## 2018-01-25 NOTE — Therapy (Signed)
[facility] [address] [place], [place], [address] Phone: [phone]   Fax:  [phone]  Physical Therapy Evaluation  Patient Details  Name: Michele Page MRN: [id number] Date of Birth: 08/23/1956 Referring Provider: Gildardo Cranker   Encounter Date: 01/25/2018  PT End of Session - 01/25/18 1040    Visit Number  1    Date for PT Re-Evaluation  03/27/18    PT Start Time  1005    PT Stop Time  1045    PT Time Calculation (min)  40 min    Activity Tolerance  Patient tolerated treatment well    Behavior During Therapy  [facility] for tasks assessed/performed       Past Medical History:  Diagnosis Date  . Arthritis    lt knee  . Breast cancer ([place])   . Breast cancer of upper-outer quadrant of left female breast ([place]) 04/02/2016  . Colon polyps   . COPD (chronic obstructive pulmonary disease) ([place])   . Hyperlipidemia   . Hypertension   . Multiple sclerosis ([place])   . Stroke ([place])    2013, mild cognitive deficits  . Vision abnormalities     Past Surgical History:  Procedure Laterality Date  . HYSTEROSCOPY W/D&C N/A 12/19/2015   Procedure: DILATATION AND CURETTAGE /HYSTEROSCOPY;  Surgeon: Linda Hedges, DO;  Location: [facility];  Service: Gynecology;  Laterality: N/A;  . RADIOACTIVE SEED GUIDED PARTIAL MASTECTOMY WITH AXILLARY SENTINEL LYMPH NODE BIOPSY Left 04/17/2016   Procedure: RADIOACTIVE SEED GUIDED PARTIAL MASTECTOMY WITH AXILLARY SENTINEL LYMPH NODE BIOPSY;  Surgeon: Erroll Luna, MD;  Location: [facility];  Service: General;  Laterality: Left;  RADIOACTIVE SEED GUIDED PARTIAL MASTECTOMY WITH AXILLARY SENTINEL LYMPH NODE BIOPSY   . TUBAL LIGATION    . VAGINAL DELIVERY     x3    There were no vitals filed for this visit.   Subjective Assessment - 01/25/18 1011    Subjective  Patient reports having left shoulder pain for about 8 months, she is unsure of a cause, has a history of stroke that affected the  right side, reviewing her history she also had a left side partial mastectomy with sentinel lymph node biopsy.  X-rays were negative    Limitations  Lifting;House hold activities    Patient Stated Goals  have less pain    Currently in Pain?  Yes    Pain Score  0-No pain    Pain Location  Shoulder    Pain Orientation  Left    Pain Descriptors / Indicators  Aching    Pain Type  Acute pain    Pain Onset  More than a month ago    Pain Frequency  Intermittent    Aggravating Factors   unsure of why the pain comes, reports that pain will be up to a 4/10, reports pain will be at least 1x/week    Pain Relieving Factors  ibuprofen helps    Effect of Pain on Daily Activities  just hurts         [facility] PT Assessment - 01/25/18 0001      Assessment   Medical Diagnosis  left shoulder pain    Referring Provider  Gildardo Cranker    Onset Date/Surgical Date  12/28/17    Hand Dominance  Right    Prior Therapy  for BPPV      Precautions   Precautions  None      Balance Screen   Has the  patient fallen in the past 6 months  No    Has the patient had a decrease in activity level because of a fear of falling?   No    Is the patient reluctant to leave their home because of a fear of falling?   No      Home Environment   Additional Comments  does her own housework, yardwork and gardening      Prior Function   Level of Independence  Independent    Vocation  Retired    Leisure  goes to the gym 2x/week      [employer] Comments  fwd head, rounded shoulders      ROM / Strength   AROM / PROM / Strength  AROM;PROM;Strength      AROM   AROM Assessment Site  Shoulder    Right/Left Shoulder  Left    Left Shoulder Flexion  150 Degrees    Left Shoulder ABduction  135 Degrees    Left Shoulder Internal Rotation  70 Degrees    Left Shoulder External Rotation  75 Degrees      PROM   PROM Assessment Site  Shoulder    Right/Left Shoulder  Left    Left Shoulder Flexion  170  Degrees    Left Shoulder ABduction  160 Degrees    Left Shoulder Internal Rotation  80 Degrees pain    Left Shoulder External Rotation  80 Degrees pain      Strength   Overall Strength Comments  4-/5, pain in the biceps, weak scapulae, winging with AROM      Flexibility   Soft Tissue Assessment /Muscle Length  -- mild left UE neural tension      Palpation   Palpation comment  she is very tender in the left biceps origin      Special Tests   Other special tests  + biceps tests             Objective measurements completed on examination: See above findings.              PT Education - 01/25/18 1039    Education provided  Yes    Education Details  Doorway stretch, scapular stabilization    Person(s) Educated  Patient    Methods  Explanation;Demonstration;Tactile cues;Verbal cues;Handout    Comprehension  Returned demonstration;Verbalized understanding;Verbal cues required;Tactile cues required       PT Short Term Goals - 01/25/18 1047      PT SHORT TERM GOAL #1   Title  independent with initial HEP    Time  2    Period  Weeks    Status  New        PT Long Term Goals - 01/25/18 1047      PT LONG TERM GOAL #1   Title  decrease pain 50%    Time  8    Period  Weeks    Status  New      PT LONG TERM GOAL #2   Title  understand proper posture and body mechanics    Time  8    Period  Weeks    Status  New      PT LONG TERM GOAL #3   Title  independent and safe at the gym    Time  8    Period  Weeks    Status  New      PT LONG TERM GOAL #4   Title  report  no difficulty dressing, doing hair or sleeping    Time  8    Period  Weeks    Status  New             Plan - 01/25/18 1044    Clinical Impression Statement  Patient with left shoulder pain over the past 8 months, she is right handed, she has a history of stroke 5-6 years ago, a partial left mastectomy 2 years ago.  She has elevated shoulders with spasms in the upper traps, she is very  tight in the pecs and the anterior shoulder.  She had positive biceps tests, very tender in the biceps origin.  She had some limitations in ROM but not significant.  Weak scapulae.    Clinical Presentation  Evolving    Clinical Decision Making  Low    Rehab Potential  Good    PT Frequency  2x / week    PT Duration  8 weeks    PT Treatment/Interventions  ADLs/Self Care Home Management;Cryotherapy;Electrical Stimulation;Iontophoresis 4mg /ml Dexamethasone;Moist Heat;Ultrasound;Therapeutic exercise;Therapeutic activities;Patient/family education;Manual techniques    PT Next Visit Plan  assure HEP, go over what she is doing at the gym and problem solve with her, she is tight anterior and weak posterior, seems to have some biceps tendonitis, could try ionto    Consulted and Agree with Plan of Care  Patient       Patient will benefit from skilled therapeutic intervention in order to improve the following deficits and impairments:  Decreased range of motion, Increased muscle spasms, Impaired UE functional use, Pain, Impaired flexibility, Decreased strength, Postural dysfunction  Visit Diagnosis: Acute pain of left shoulder - Plan: PT plan of care cert/re-cert  Stiffness of left shoulder, not elsewhere classified - Plan: PT plan of care cert/re-cert     Problem List Patient Active Problem List   Diagnosis Date Noted  . Left shoulder pain 11/25/2017  . Benign paroxysmal positional vertigo 11/25/2017  . History of breast cancer 11/25/2017  . Insomnia 11/25/2017  . Breast cancer of upper-outer quadrant of left female breast ([place]) 04/02/2016  . Essential hypertension 01/10/2016  . 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 Gastroesophageal reflux disease without esophagitis 01/10/2016  . Hyperlipidemia 01/10/2016  . CKD (chronic kidney disease) 01/10/2016  . Multiple sclerosis ([place]) 03/20/2015  . Accelerated hypertension 03/20/2015  . CVA (cerebral  infarction) 03/20/2015  . Hemiplegia following CVA (cerebrovascular accident) ([place]) 03/20/2015  . Cognitive disorder 03/20/2015  . Nocturia 03/20/2015  . Snoring 03/20/2015  . Aphasia S/P CVA 03/20/2015  . 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

## 2018-01-27 ENCOUNTER — Inpatient Hospital Stay: Payer: Medicare HMO | Attending: Hematology and Oncology | Admitting: Hematology and Oncology

## 2018-01-27 DIAGNOSIS — C50412 Malignant neoplasm of upper-outer quadrant of left female breast: Secondary | ICD-10-CM | POA: Diagnosis not present

## 2018-01-27 DIAGNOSIS — Z170 Estrogen receptor positive status [ER+]: Secondary | ICD-10-CM | POA: Diagnosis not present

## 2018-01-27 DIAGNOSIS — Z79810 Long term (current) use of selective estrogen receptor modulators (SERMs): Secondary | ICD-10-CM | POA: Diagnosis not present

## 2018-01-27 DIAGNOSIS — Z923 Personal history of irradiation: Secondary | ICD-10-CM | POA: Diagnosis not present

## 2018-01-27 NOTE — Progress Notes (Signed)
Patient Care Team: Gildardo Cranker, DO as PCP - General (Internal Medicine) Nicholas Lose, MD as Consulting Physician (Oncology) Erroll Luna, MD as Consulting Physician (General Surgery) Thea Silversmith, MD (Inactive) as Consulting Physician (Radiation Oncology)  DIAGNOSIS:  Encounter Diagnosis  Name Primary?  . Malignant neoplasm of upper-outer quadrant of left breast in female, estrogen receptor positive ([place])     SUMMARY OF ONCOLOGIC HISTORY:   Breast cancer of upper-outer quadrant of left female breast ([place])   03/31/2016 Initial Diagnosis    Screening detected left breast mass, 2 nodules, 1.9 x 1.6 x 0.8 cm= fat necrosis; 8 x 7 x 7 mm= grade 2 IDC ER 100%, PR 100%, HER-2 negative ratio 1.39, Ki-67 20%      04/17/2016 Surgery    Left lumpectomy ([name]): IDC grade 2, 1.3 cm, with DCIS, margins negative, 0/4 lymph nodes negative, T1 cN0 stage IA pathologic stage, ER 100%, PR 100%, HER-2 negative ratio 1.39, Ki-67 20% Oncotype DX score 20, 13% ROR, intermediate risk      04/17/2016 Oncotype testing    Recurrence score: 20; ROR 15% (intermediate risk)       05/27/2016 - 07/13/2016 Radiation Therapy    Adjuvant radiation therapy [facility]): Left breast treated with breath hold to 50.4 Gy in 28 fractions at 1.8 Gy/fraction.  Left breast boosted to 10 Gy in 5 fractions at 2 Gy/fraction      07/23/2016 -  Anti-estrogen oral therapy    Anastrozole 1 mg switched to letrozole 04/22/2017 due to hair loss, switch to tamoxifen 10/22/2017 due to hair loss from letrozole as well       CHIEF COMPLIANT: Continued problems with hair loss  INTERVAL HISTORY: Michele Page is a 62 year old with above-mentioned history of left breast cancer currently on antiestrogen therapy.  She could not tolerate letrozole because of hair loss and we switched her to tamoxifen.  It appears that the hair loss is slightly lesser with the tamoxifen but she continues to have hair loss and is very concerned about it.  She  does not think the hair loss is coming from any of her other medications.  She is otherwise tolerating tamoxifen extremely well.  She denies any lumps or nodules in the breast.  REVIEW OF SYSTEMS:   Constitutional: Denies fevers, chills or abnormal weight loss, complaining of hair loss Eyes: Denies blurriness of vision Ears, nose, mouth, throat, and face: Denies mucositis or sore throat Respiratory: Denies cough, dyspnea or wheezes Cardiovascular: Denies palpitation, chest discomfort Gastrointestinal:  Denies nausea, heartburn or change in bowel habits Skin: Denies abnormal skin rashes Lymphatics: Denies new lymphadenopathy or easy bruising Neurological:Denies numbness, tingling or new weaknesses Behavioral/Psych: Mood is stable, no new changes  Extremities: No lower extremity edema Breast:  denies any pain or lumps or nodules in either breasts All other systems were reviewed with the patient and are negative.  I have reviewed the past medical history, past surgical history, social history and family history with the patient and they are unchanged from previous note.  ALLERGIES:  has No Known Allergies.  MEDICATIONS:  Current Outpatient Medications  Medication Sig Dispense Refill  . aspirin EC 81 MG tablet Take 1 tablet (81 mg total) by mouth daily. 30 tablet 6  . eszopiclone (LUNESTA) 2 MG TABS tablet Take 1 tablet (2 mg total) by mouth at bedtime as needed for sleep. Take immediately before bedtime 15 tablet 0  . labetalol (NORMODYNE) 300 MG tablet TAKE TWO TABLETS BY MOUTH THREE TIMES DAILY 180  tablet 6  . montelukast (SINGULAIR) 5 MG chewable tablet Chew 1 tablet (5 mg total) by mouth at bedtime. 30 tablet 6  . NIFEdipine (PROCARDIA-XL/ADALAT CC) 60 MG 24 hr tablet TAKE 1 TABLET BY MOUTH TWICE DAILY 120 tablet 0  . pravastatin (PRAVACHOL) 10 MG tablet Take 1 tablet (10 mg total) at bedtime by mouth. 90 tablet 1  . tamoxifen (NOLVADEX) 20 MG tablet Take 1 tablet (20 mg total) by  mouth daily. 90 tablet 3   No current facility-administered medications for this visit.     PHYSICAL EXAMINATION: ECOG PERFORMANCE STATUS: 1 - Symptomatic but completely ambulatory  Vitals:   01/27/18 0923  BP: 134/75  Pulse: 81  Resp: 18  Temp: 98.9 F (37.2 C)  SpO2: 99%   Filed Weights   01/27/18 0923  Weight: 198 lb 9.6 oz (90.1 kg)    GENERAL:alert, no distress and comfortable SKIN: skin color, texture, turgor are normal, no rashes or significant lesions EYES: normal, Conjunctiva are pink and non-injected, sclera clear OROPHARYNX:no exudate, no erythema and lips, buccal mucosa, and tongue normal  NECK: supple, thyroid normal size, non-tender, without nodularity LYMPH:  no palpable lymphadenopathy in the cervical, axillary or inguinal LUNGS: clear to auscultation and percussion with normal breathing effort HEART: regular rate & rhythm and no murmurs and no lower extremity edema ABDOMEN:abdomen soft, non-tender and normal bowel sounds MUSCULOSKELETAL:no cyanosis of digits and no clubbing  NEURO: alert & oriented x 3 with fluent speech, no focal motor/sensory deficits EXTREMITIES: No lower extremity edema  LABORATORY DATA:  I have reviewed the data as listed CMP Latest Ref Rng & Units 11/24/2017 05/26/2017 12/18/2016  Glucose 65 - 99 mg/dL 98 94 120(H)  BUN 7 - 25 mg/dL _0 Creatinine 0.50 - 0.99 mg/dL 1.15(H) 1.29(H) 1.49(H)  Sodium 135 - 146 mmol/L 140 140 140  Potassium 3.5 - 5.3 mmol/L 4.3 3.6 3.9  Chloride 98 - 110 mmol/L 103 103 105  CO2 20 - 32 mmol/L _1 Calcium 8.6 - 10.4 mg/dL 9.9 9.7 9.2  Total Protein 6.1 - 8.1 g/dL 7.0 6.9 6.6  Total Bilirubin 0.2 - 1.2 mg/dL 0.5 0.5 0.4  Alkaline Phos 33 - 130 U/L - 81 74  AST 10 - 35 U/L _2 ALT 6 - 29 U/L _3 Lab Results  Component Value Date   WBC 4.4 11/24/2017   HGB 12.3 11/24/2017   HCT 36.1 11/24/2017   MCV 90.5 11/24/2017   PLT 321 11/24/2017   NEUTROABS 2,702 11/24/2017     ASSESSMENT & PLAN:  Breast cancer of upper-outer quadrant of left female breast ([place]) 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, 1.3 cm, with DCIS, margins negative, 0/4 lymph nodes negative, T1 cN0 stage IA pathologic stage, ER 100%, PR 100%, HER-2 negative ratio 1.39, Ki-67 20% (Originally 2 nodules were detected on screening mammogram 1.9 cm= fat necrosis and 8 mm IDC) Oncotype DX score 20, 13% risk of recurrence, intermediate risk Adjuvant radiation therapy started 07/20/2017completed 07/13/2016  Current treatment: Adjuvant antiestrogen therapy with anastrozole started 07/23/2016 but switched to letrozole in June 2018 due to hair loss, switched to tamoxifen 10/22/2017 due to continuing hair loss with letrozole  Tamoxifen toxicities: Denies any hot flashes or myalgias.  Hair loss: Patient has having extraordinary amount of emotional suffering from the hair loss that she is experiencing.  I offered her holding tamoxifen for a few months to see if it makes it  any better.  However she did not want to hold it.  Instead I did recommended that she increase the dosage of tamoxifen to 10 mg daily.  She will give that a try.  I also encouraged her to use coconut oil on the scalp before her shower so that it can strengthen her hair follicles.  She had previously used biotin which was not of much help.  Surveillance: 1. Breast exam 04/22/2017: Benign 2. Mammogram : Patient needs a mammogram performed.  Return to clinic in 6 months for follow-up   I spent 25 minutes talking to the patient of which more than half was spent in counseling and coordination of care.  No orders of the defined types were placed in this encounter.  The patient has a good understanding of the overall plan. she agrees with it. she will call with any problems that may develop before the next visit here.   Harriette Ohara, MD 01/27/18

## 2018-01-27 NOTE — Assessment & Plan Note (Signed)
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, 1.3 cm, with DCIS, margins negative, 0/4 lymph nodes negative, T1 cN0 stage IA pathologic stage, ER 100%, PR 100%, HER-2 negative ratio 1.39, Ki-67 20% (Originally 2 nodules were detected on screening mammogram 1.9 cm= fat necrosis and 8 mm IDC) Oncotype DX score 20, 13% risk of recurrence, intermediate risk Adjuvant radiation therapy started 07/20/2017completed 07/13/2016  Current treatment: Adjuvant antiestrogen therapy with anastrozole started 07/23/2016 but switched to letrozole in June 2018 due to hair loss, switched to tamoxifen 10/22/2017 due to continuing hair loss with letrozole  Tamoxifen toxicities:  Surveillance: 1. Breast exam 04/22/2017: Benign 2. Mammogram : Patient needs a mammogram performed.  Return to clinic in 6 months for follow-up

## 2018-02-03 ENCOUNTER — Ambulatory Visit: Payer: Medicare HMO | Admitting: Physical Therapy

## 2018-02-03 ENCOUNTER — Other Ambulatory Visit: Payer: Self-pay

## 2018-02-03 ENCOUNTER — Encounter: Payer: Self-pay | Admitting: Physical Therapy

## 2018-02-03 DIAGNOSIS — H8111 Benign paroxysmal vertigo, right ear: Secondary | ICD-10-CM | POA: Diagnosis not present

## 2018-02-03 DIAGNOSIS — M25612 Stiffness of left shoulder, not elsewhere classified: Secondary | ICD-10-CM | POA: Diagnosis not present

## 2018-02-03 DIAGNOSIS — M25512 Pain in left shoulder: Secondary | ICD-10-CM | POA: Diagnosis not present

## 2018-02-03 DIAGNOSIS — M6281 Muscle weakness (generalized): Secondary | ICD-10-CM

## 2018-02-03 NOTE — Therapy (Signed)
[address] [place], [place], [address] Phone: [phone]   Fax:  [phone]  Physical Therapy Treatment  Patient Details  Name: Michele Page MRN: [id number] Date of Birth: 1956/03/04 Referring Provider: Gildardo Cranker   Encounter Date: 02/03/2018  PT End of Session - 02/03/18 1043    Visit Number  2    Date for PT Re-Evaluation  03/27/18       Past Medical History:  Diagnosis Date  . Arthritis    lt knee  . Breast cancer ([place])   . Breast cancer of upper-outer quadrant of left female breast ([place]) 04/02/2016  . Colon polyps   . COPD (chronic obstructive pulmonary disease) ([place])   . Hyperlipidemia   . Hypertension   . Multiple sclerosis ([place])   . Stroke ([place])    2013, mild cognitive deficits  . Vision abnormalities     Past Surgical History:  Procedure Laterality Date  . HYSTEROSCOPY W/D&C N/A 12/19/2015   Procedure: DILATATION AND CURETTAGE /HYSTEROSCOPY;  Surgeon: Linda Hedges, DO;  Location: [facility];  Service: Gynecology;  Laterality: N/A;  . RADIOACTIVE SEED GUIDED PARTIAL MASTECTOMY WITH AXILLARY SENTINEL LYMPH NODE BIOPSY Left 04/17/2016   Procedure: RADIOACTIVE SEED GUIDED PARTIAL MASTECTOMY WITH AXILLARY SENTINEL LYMPH NODE BIOPSY;  Surgeon: Erroll Luna, MD;  Location: [facility];  Service: General;  Laterality: Left;  RADIOACTIVE SEED GUIDED PARTIAL MASTECTOMY WITH AXILLARY SENTINEL LYMPH NODE BIOPSY   . TUBAL LIGATION    . VAGINAL DELIVERY     x3    There were no vitals filed for this visit.  Subjective Assessment - 02/03/18 1259    Subjective  Pt arriving to therapy reporting no pain and feels she would like to decrease her therapy frequency. Pt reported she would like to skip next week and only come one visit the following week.     Pertinent History  history of stroke that affected the right side, reviewing her history she also had a left side partial mastectomy with  sentinel lymph node biopsy.  X-rays were negative    Limitations  Lifting;House hold activities    Currently in Pain?  No/denies                      [facility] Adult PT Treatment/Exercise - 02/03/18 0001      Exercises   Exercises  Shoulder      Shoulder Exercises: Supine   External Rotation  AAROM;15 reps    Flexion  AAROM;Both;15 reps    ABduction  AAROM;Left;15 reps      Shoulder Exercises: Standing   External Rotation  Strengthening;Left;10 reps;Theraband    Theraband Level (Shoulder External Rotation)  Level 1 (Yellow)    Flexion  Strengthening;10 reps;Theraband    Theraband Level (Shoulder Flexion)  Level 1 (Yellow)    Extension  Strengthening;10 reps;Theraband    Row  Strengthening;15 reps;Theraband    Theraband Level (Shoulder Row)  Level 2 (Red)    Other Standing Exercises  ball walks x 5 times,     Other Standing Exercises  wall push ups               PT Short Term Goals - 02/03/18 1042      PT SHORT TERM GOAL #1   Title  independent with initial HEP    Time  2    Period  Weeks    Status  Achieved  PT Long Term Goals - 02/03/18 1302      PT LONG TERM GOAL #3   Title  independent and safe at the gym with upper extremity exercises    Baseline  Pt reported going to [employer] 4 days each week and having no diffculty.             Plan - 02/03/18 1304    Clinical Impression Statement  Pt tolerating ther exercises well today with concentration on shoulder ROM and mobility. Pt requesting to come every other week. Continue toward original goals, new ROM goals added for shoulder flexion and abduction.     Rehab Potential  Good    PT Frequency  2x / week    PT Duration  8 weeks    PT Treatment/Interventions  ADLs/Self Care Home Management;Cryotherapy;Electrical Stimulation;Iontophoresis 4mg /ml Dexamethasone;Moist Heat;Ultrasound;Therapeutic exercise;Therapeutic activities;Patient/family education;Manual techniques    PT Next Visit  Plan  assure HEP, go over what she is doing at the gym and problem solve with her, she is tight anterior and weak posterior, seems to have some biceps tendonitis, could try ionto    PT Home Exercise Plan  cane exercises for flexion and abduction.     Consulted and Agree with Plan of Care  Patient       Patient will benefit from skilled therapeutic intervention in order to improve the following deficits and impairments:  Decreased range of motion, Increased muscle spasms, Impaired UE functional use, Pain, Impaired flexibility, Decreased strength, Postural dysfunction  Visit Diagnosis: Acute pain of left shoulder  Stiffness of left shoulder, not elsewhere classified  BPPV (benign paroxysmal positional vertigo), right  Muscle weakness (generalized)     Problem List Patient Active Problem List   Diagnosis Date Noted  . Left shoulder pain 11/25/2017  . Benign paroxysmal positional vertigo 11/25/2017  . History of breast cancer 11/25/2017  . Insomnia 11/25/2017  . Breast cancer of upper-outer quadrant of left female breast ([place]) 04/02/2016  . Essential hypertension 01/10/2016  . 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 Gastroesophageal reflux disease without esophagitis 01/10/2016  . Hyperlipidemia 01/10/2016  . CKD (chronic kidney disease) 01/10/2016  . Multiple sclerosis ([place]) 03/20/2015  . Accelerated hypertension 03/20/2015  . CVA (cerebral infarction) 03/20/2015  . Hemiplegia following CVA (cerebrovascular accident) ([place]) 03/20/2015  . Cognitive disorder 03/20/2015  . Nocturia 03/20/2015  . Snoring 03/20/2015  . Aphasia S/P CVA 03/20/2015  . 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

## 2018-02-07 ENCOUNTER — Other Ambulatory Visit: Payer: Self-pay | Admitting: Cardiovascular Disease

## 2018-02-07 DIAGNOSIS — I10 Essential (primary) hypertension: Secondary | ICD-10-CM

## 2018-02-14 ENCOUNTER — Other Ambulatory Visit: Payer: Self-pay | Admitting: *Deleted

## 2018-02-14 MED ORDER — PRAVASTATIN SODIUM 10 MG PO TABS: 10.0000 mg | ORAL_TABLET | Freq: Every day | ORAL | 1 refills | Status: DC

## 2018-02-14 NOTE — Telephone Encounter (Signed)
Michele Page

## 2018-02-15 ENCOUNTER — Ambulatory Visit: Payer: Medicare HMO | Admitting: Physical Therapy

## 2018-02-25 ENCOUNTER — Ambulatory Visit (INDEPENDENT_AMBULATORY_CARE_PROVIDER_SITE_OTHER): Payer: Medicare HMO | Admitting: Internal Medicine

## 2018-02-25 ENCOUNTER — Encounter: Payer: Self-pay | Admitting: Internal Medicine

## 2018-02-25 VITALS — BP 114/68 | HR 78 | Temp 98.6°F | Ht 63.0 in | Wt 198.6 lb

## 2018-02-25 DIAGNOSIS — Z853 Personal history of malignant neoplasm of breast: Secondary | ICD-10-CM | POA: Diagnosis not present

## 2018-02-25 DIAGNOSIS — J302 Other seasonal allergic rhinitis: Secondary | ICD-10-CM | POA: Diagnosis not present

## 2018-02-25 DIAGNOSIS — I6930 Unspecified sequelae of cerebral infarction: Secondary | ICD-10-CM

## 2018-02-25 DIAGNOSIS — T50905A Adverse effect of unspecified drugs, medicaments and biological substances, initial encounter: Secondary | ICD-10-CM

## 2018-02-25 DIAGNOSIS — E782 Mixed hyperlipidemia: Secondary | ICD-10-CM | POA: Diagnosis not present

## 2018-02-25 DIAGNOSIS — L658 Other specified nonscarring hair loss: Secondary | ICD-10-CM

## 2018-02-25 DIAGNOSIS — I10 Essential (primary) hypertension: Secondary | ICD-10-CM

## 2018-02-25 NOTE — Patient Instructions (Addendum)
Continue current medications as ordered  Follow up with Dr Lindi Adie as scheduled  Follow up in 4 mos for HTN, breast cancer, hx stroke, MS. Fasting labs prior to appt

## 2018-02-25 NOTE — Progress Notes (Signed)
Patient ID: Michele Page, female   DOB: 07/09/1956, 62 y.o.   MRN: [id number]   Location:  [facility] OFFICE  Provider: DR Arletha Grippe  Code Status:  Goals of Care:  Advanced Directives 01/25/2018  Does Patient Have a Medical Advance Directive? No  Would patient like information on creating a medical advance directive? -     Chief Complaint  Patient presents with  . Medical Management of Chronic Issues    3 month follow-up on HTNm Hyperlipidemia, history of breast cancer, and left shoulder pain. Patient c/o black stool off/on   . Advance Care Planning    Refused ACP   . Medication Refill    No refills needed   . Medication Management    Discuss discontinuing Pravastatin to see if cholesterol will remain within range   . Health Maintenance    Patient refused all HM and Best Pratice Recommendations, Pap, Shingrix, Tdap, and Pneu     HPI: Patient is a 62 y.o. female seen today for medical management of chronic diseases.  Seasonal allergy sx's improved on singulair.  She c/o hair loss issues and states "I'm not taking tamoxifen" and she has decided to change back to initial antiestrogen arimidex. She has not notified her oncologist regarding her decision.   Left knee pain - stable. She did f/u with Ortho for 2nd opinion and had fluid aspirated which helped. She wears a knee brace prn which also helps. In the past, she has rec'd knee injection by Ortho that she states made her pain worse and she has gait dysfunction due to knee. She no longer takes any medication for it. MRI done in March 2018 revealed large joint effusion and tricompartmental cartilage abnormalities, and lateral femoral condyle friction syndrome.   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 She completed XRT in June 2018. She was told no chemotx needed. On femara from 04/2017- Dec 2018 and changed to tamoxifen due to hair loss concerns. Arimidex caused hair loss.  Followed by Dr Lindi Adie. At her last appt with oncology last month, tamoxifen reduced to '10mg'$  due to continued hair loss  Insomnia - stable. She stopped taking lunesta.  HTN - stable on labetalol and procardia. Followed by cardio Dr Gwenlyn Found. She takes ASA daily  Hyperlipidemia - controlled on pravastatin. LDL 82. No myalgias  COPD - stable. no exacerbations. No issues with breathing  Hx CVA - stable. Takes statin and ASA daily. LDL 82. Followed by Dr Felecia Shelling. MRI brain in Apr 2018 revealed remote hemorrhagic infarct in left basal ganglia and thalamus. She has expressive aphasia.  MS - no changes. asymptomatic. no diplopia but she cannot "stare" for prolonged time without blinking. Followed by Neurology Dr Felecia Shelling. No exacerbations. MRI brain in Apr 2018 revealed b/l nonspecific extensive periventricular and subcortical white matter diseasel.  Gastritis/GERD - stable off nexium.  She had a colonoscopy last yr and tubular adenoma polyps removed. Denies acid reflux sx's.  CKD - stage 3. probably due to HTN. Cr 1.15   Past Medical History:  Diagnosis Date  . Arthritis    lt knee  . Breast cancer ([place])   . Breast cancer of upper-outer quadrant of left female breast ([place]) 04/02/2016  . Colon polyps   . COPD (chronic obstructive pulmonary disease) ([place])   . Hyperlipidemia   . Hypertension   . Multiple sclerosis ([place])   . Stroke ([place])    2013, mild cognitive deficits  .  Vision abnormalities     Past Surgical History:  Procedure Laterality Date  . HYSTEROSCOPY W/D&C N/A 12/19/2015   Procedure: DILATATION AND CURETTAGE /HYSTEROSCOPY;  Surgeon: Linda Hedges, DO;  Location: [facility];  Service: Gynecology;  Laterality: N/A;  . RADIOACTIVE SEED GUIDED PARTIAL MASTECTOMY WITH AXILLARY SENTINEL LYMPH NODE BIOPSY Left 04/17/2016   Procedure: RADIOACTIVE SEED GUIDED PARTIAL MASTECTOMY WITH AXILLARY SENTINEL LYMPH NODE BIOPSY;  Surgeon: Erroll Luna, MD;  Location: [facility];  Service:  General;  Laterality: Left;  RADIOACTIVE SEED GUIDED PARTIAL MASTECTOMY WITH AXILLARY SENTINEL LYMPH NODE BIOPSY   . TUBAL LIGATION    . VAGINAL DELIVERY     x3     reports that she quit smoking about 13 years ago. Her smoking use included cigarettes. She has a 12.50 pack-year smoking history. She has never used smokeless tobacco. She reports that she drinks alcohol. She reports that she does not use drugs. Social History   Socioeconomic History  . Marital status: Married    Spouse name: Not on file  . Number of children: 3  . Years of education: Not on file  . Highest education level: Not on file  Occupational History  . Occupation: retired  Scientific laboratory technician  . Financial resource strain: Not hard at all  . Food insecurity:    Worry: Never true    Inability: Never true  . Transportation needs:    Medical: No    Non-medical: No  Tobacco Use  . Smoking status: Former Smoker    Packs/day: 0.50    Years: 25.00    Pack years: 12.50    Types: Cigarettes    Last attempt to quit: 11/16/2004    Years since quitting: 13.2  . Smokeless tobacco: Never Used  Substance and Sexual Activity  . Alcohol use: Yes    Alcohol/week: 0.0 oz    Comment: occas  . Drug use: No  . Sexual activity: Never  Lifestyle  . Physical activity:    Days per week: 1 day    Minutes per session: 90 min  . Stress: Only a little  Relationships  . Social connections:    Talks on phone: Twice a week    Gets together: Once a week    Attends religious service: More than 4 times per year    Active member of club or organization: No    Attends meetings of clubs or organizations: Never    Relationship status: Married  . Intimate partner violence:    Fear of current or ex partner: No    Emotionally abused: No    Physically abused: No    Forced sexual activity: No  Other Topics Concern  . Not on file  Social History Narrative   Diet- N/A   Caffeine- Yes   Married- Yes   House- 2 story with 2 people   Pets-  No   Current/past profession- Nurse, mental health   Exercise- Yes   Living will-No   DNR-N/A   POA/HPOA-No       Family History  Problem Relation Age of Onset  . Stroke Sister   . Hypertension Mother   . Stroke Mother   . Alcohol abuse Brother   . HIV/AIDS Brother     No Known Allergies  Outpatient Encounter Medications as of 02/25/2018  Medication Sig  . aspirin EC 81 MG tablet Take 1 tablet (81 mg total) by mouth daily.  Marland Kitchen labetalol (NORMODYNE) 300 MG tablet TAKE 2 TABLETS BY MOUTH THREE  TIMES DAILY  . NIFEdipine (PROCARDIA-XL/ADALAT CC) 60 MG 24 hr tablet TAKE 1 TABLET BY MOUTH TWICE DAILY  . pravastatin (PRAVACHOL) 10 MG tablet Take 1 tablet (10 mg total) by mouth at bedtime.  . [DISCONTINUED] eszopiclone (LUNESTA) 2 MG TABS tablet Take 1 tablet (2 mg total) by mouth at bedtime as needed for sleep. Take immediately before bedtime  . [DISCONTINUED] montelukast (SINGULAIR) 5 MG chewable tablet Chew 1 tablet (5 mg total) by mouth at bedtime.  . [DISCONTINUED] tamoxifen (NOLVADEX) 20 MG tablet Take 1 tablet (20 mg total) by mouth daily.   No facility-administered encounter medications on file as of 02/25/2018.     Review of Systems:  Review of Systems  Unable to perform ROS: Other (expressive aphasia)    Health Maintenance  Topic Date Due  . PAP SMEAR  11/16/2018 (Originally 08/03/1976)  . HIV Screening  11/16/2018 (Originally 08/03/1970)  . Hepatitis C Screening  11/24/2018 (Originally 1956-05-01)  . TETANUS/TDAP  11/17/2019 (Originally 08/03/1974)  . MAMMOGRAM  03/31/2018  . INFLUENZA VACCINE  06/16/2018  . COLONOSCOPY  07/07/2018    Physical Exam: Vitals:   02/25/18 0844  BP: 114/68  Pulse: 78  Temp: 98.6 F (37 C)  TempSrc: Oral  SpO2: 95%  Weight: 198 lb 9.6 oz (90.1 kg)  Height: 5' 3"  (1.6 m)   Body mass index is 35.18 kg/m. Physical Exam  Constitutional: She is oriented to person, place, and time. She appears well-developed and well-nourished.  HENT:    Mouth/Throat: Oropharynx is clear and moist. No oropharyngeal exudate.  MMM; no oral thrush  Eyes: Pupils are equal, round, and reactive to light. No scleral icterus.  Neck: Neck supple. Carotid bruit is not present. No tracheal deviation present. No thyromegaly present.  Cardiovascular: Normal rate, regular rhythm and intact distal pulses. Exam reveals no gallop and no friction rub.  Murmur (1/6 SEM) heard. No LE edema b/l. no calf TTP.   Pulmonary/Chest: Effort normal and breath sounds normal. No stridor. No respiratory distress. She has no wheezes. She has no rales.  Abdominal: Soft. Normal appearance and bowel sounds are normal. She exhibits no distension and no mass. There is no hepatomegaly. There is no tenderness. There is no rigidity, no rebound and no guarding. No hernia.  Musculoskeletal: She exhibits edema.  Lymphadenopathy:    She has no cervical adenopathy.  Neurological: She is alert and oriented to person, place, and time.  Skin: Skin is warm and dry. No rash noted.  Psychiatric: Thought content normal. Her affect is angry. Her speech is slurred. She is aggressive.    Labs reviewed: Basic Metabolic Panel: Recent Labs    05/26/17 0932 11/24/17 0951  NA 140 140  K 3.6 4.3  CL 103 103  CO2 25 27  GLUCOSE 94 98  BUN 14 12  CREATININE 1.29* 1.15*  CALCIUM 9.7 9.9  TSH 1.37  --    Liver Function Tests: Recent Labs    05/26/17 0932 11/24/17 0951  AST 23 21  ALT 19 19  ALKPHOS 81  --   BILITOT 0.5 0.5  PROT 6.9 7.0  ALBUMIN 4.6  --    No results for input(s): LIPASE, AMYLASE in the last 8760 hours. No results for input(s): AMMONIA in the last 8760 hours. CBC: Recent Labs    05/26/17 0932 11/24/17 0951  WBC 4.2 4.4  NEUTROABS 2,562 2,702  HGB 12.4 12.3  HCT 37.5 36.1  MCV 93.3 90.5  PLT 309 321   Lipid Panel:  Recent Labs    05/26/17 0932 11/24/17 0951  CHOL 175 178  HDL 65 77  LDLCALC 88 82  TRIG 108 98  CHOLHDL 2.7 2.3   No results found  for: HGBA1C  Procedures since last visit: No results found.  Assessment/Plan   ICD-10-CM   1. History of stroke with residual deficit I69.30   2. History of breast cancer Z85.3 CBC with Differential/Platelets    CMP with eGFR([facility])  3. 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 Mixed hyperlipidemia E78.2 Lipid Panel    TSH  5. Essential hypertension I10 Urinalysis with Reflex Microscopic  6. Seasonal allergic rhinitis, unspecified trigger J30.2     Continue current medications as ordered -Arimidex 66m daily added to med list  Follow up with Dr GLindi Adieas scheduled  Follow up in 4 mos for HTN, breast cancer, hx stroke, MS. Fasting labs prior to appt    MLindsayS. CPerlie Gold PThe Orthopaedic [facility] [address]GBono  [phone] (Monday-Friday 8 AM - 5 PM) [phone] 5 PM and follow prompts

## 2018-03-12 ENCOUNTER — Other Ambulatory Visit: Payer: Self-pay | Admitting: Cardiovascular Disease

## 2018-03-14 NOTE — Telephone Encounter (Signed)
Rx sent to pharmacy   

## 2018-03-21 ENCOUNTER — Ambulatory Visit: Payer: Medicare HMO | Admitting: Neurology

## 2018-03-22 ENCOUNTER — Encounter: Payer: Self-pay | Admitting: Neurology

## 2018-04-18 ENCOUNTER — Other Ambulatory Visit: Payer: Self-pay

## 2018-04-18 ENCOUNTER — Telehealth: Payer: Self-pay

## 2018-04-18 DIAGNOSIS — Z853 Personal history of malignant neoplasm of breast: Secondary | ICD-10-CM

## 2018-04-18 MED ORDER — ANASTROZOLE 1 MG PO TABS: 1.0000 mg | ORAL_TABLET | Freq: Every day | ORAL | 2 refills | Status: DC

## 2018-04-18 NOTE — Telephone Encounter (Signed)
Returned pt's call regarding she would like the anastrozole refilled.  Per last visit from 01/2018, Pt has tried anastrozole 07-2016, but due to hair loss, switched to letrozole in 04/2017 then had ongoing issues w/ hair loss, was switched to tamoxifen in 10-2017.    Per Dr Eulas Post in 02/2017, pt does not want to take tamoxifen any longer and switched back to anastrozole. Reports she had some left over and has been taking the Anastrozole a month now with no issues.  Reports her hair is no longer falling out, said it is not really growing back yet but no longer falling out, so pt prefers to be on Anastrozole and has stopped Tamoxifen.  Will inbasket Dr Lindi Adie to make him aware and pt's next appt is Dec 2019.  She voiced understanding to call if any further issues before then.

## 2018-04-18 NOTE — Telephone Encounter (Signed)
Sounds reasonable. We can keep her on Anastrozole VG

## 2018-04-19 ENCOUNTER — Encounter: Payer: Self-pay | Admitting: Internal Medicine

## 2018-04-19 ENCOUNTER — Ambulatory Visit (INDEPENDENT_AMBULATORY_CARE_PROVIDER_SITE_OTHER): Payer: Medicare HMO | Admitting: Internal Medicine

## 2018-04-19 VITALS — BP 134/80 | HR 82 | Temp 98.3°F | Ht 63.0 in | Wt 198.0 lb

## 2018-04-19 DIAGNOSIS — Z124 Encounter for screening for malignant neoplasm of cervix: Secondary | ICD-10-CM | POA: Diagnosis not present

## 2018-04-19 DIAGNOSIS — J302 Other seasonal allergic rhinitis: Secondary | ICD-10-CM | POA: Diagnosis not present

## 2018-04-19 MED ORDER — NIFEDIPINE ER 60 MG PO TB24: 60.0000 mg | ORAL_TABLET | Freq: Two times a day (BID) | ORAL | 2 refills | Status: DC

## 2018-04-19 MED ORDER — MONTELUKAST SODIUM 10 MG PO TABS: 10.0000 mg | ORAL_TABLET | Freq: Every day | ORAL | 6 refills | Status: DC

## 2018-04-19 MED ORDER — METHYLPREDNISOLONE ACETATE 40 MG/ML IJ SUSP
40.0000 mg | Freq: Once | INTRAMUSCULAR | Status: AC
  Administered 2018-04-19: 40 mg via INTRAMUSCULAR

## 2018-04-19 MED ORDER — PREDNISONE 10 MG PO TABS: ORAL_TABLET | ORAL | 0 refills | Status: DC

## 2018-04-19 NOTE — Patient Instructions (Addendum)
DEPO-MEDROL 40MG  INJECTION GIVEN TODAY  START SINGULAIR/MONTELUKAST 10MG  AT BEDTIME for seasonal allergy  Will call with GYN referral  Continue other medications as ordered  Follow up as scheduled or sooner if need be

## 2018-04-19 NOTE — Progress Notes (Signed)
Patient ID: Michele Page, female   DOB: 1956/01/02, 62 y.o.   MRN: [id number]   [facility] OFFICE  Provider: DR Arletha Grippe  Code Status:  Goals of Care:  Advanced Directives 01/25/2018  Does Patient Have a Medical Advance Directive? No  Would patient like information on creating a medical advance directive? -     Chief Complaint  Patient presents with  . Acute Visit    Mucous in throat (thick/white) x 3 weeks, patient with breathing issues at night as a results of thick mucous.   . Medication Refill    Procardia, 90 day supply   . Referral    GYN referral, Pap overdue   . Medication Management    Requesting water pill to assist with weight loss, patient states " My thighs are too big and my weight is not coming off and I go to the gym every other day."   . Bleeding/Bruising    Unexplained bruising     HPI: Patient is a 62 y.o. female seen today for an acute visit for sinus concerns. She has thick mucous production x 3 weeks. She stopped taking singulair some time ago as she felt it was not working. She has tried other antihistamines in the past without success.  She reports no HA, new dizziness, f/c. She has dry intermittent cough. No N/V. No sick contacts.  She is a poor historian due to memory loss. Hx obtained from chart  Past Medical History:  Diagnosis Date  . Arthritis    lt knee  . Breast cancer ([place])   . Breast cancer of upper-outer quadrant of left female breast ([place]) 04/02/2016  . Colon polyps   . COPD (chronic obstructive pulmonary disease) ([place])   . Hyperlipidemia   . Hypertension   . Multiple sclerosis ([place])   . Stroke ([place])    2013, mild cognitive deficits  . Vision abnormalities     Past Surgical History:  Procedure Laterality Date  . HYSTEROSCOPY W/D&C N/A 12/19/2015   Procedure: DILATATION AND CURETTAGE /HYSTEROSCOPY;  Surgeon: Linda Hedges, DO;  Location: [facility];  Service: Gynecology;  Laterality: N/A;  . RADIOACTIVE SEED GUIDED PARTIAL MASTECTOMY WITH  AXILLARY SENTINEL LYMPH NODE BIOPSY Left 04/17/2016   Procedure: RADIOACTIVE SEED GUIDED PARTIAL MASTECTOMY WITH AXILLARY SENTINEL LYMPH NODE BIOPSY;  Surgeon: Erroll Luna, MD;  Location: [facility];  Service: General;  Laterality: Left;  RADIOACTIVE SEED GUIDED PARTIAL MASTECTOMY WITH AXILLARY SENTINEL LYMPH NODE BIOPSY   . TUBAL LIGATION    . VAGINAL DELIVERY     x3     reports that she quit smoking about 13 years ago. Her smoking use included cigarettes. She has a 12.50 pack-year smoking history. She has never used smokeless tobacco. She reports that she drinks alcohol. She reports that she does not use drugs. Social History   Socioeconomic History  . Marital status: Married    Spouse name: Not on file  . Number of children: 3  . Years of education: Not on file  . Highest education level: Not on file  Occupational History  . Occupation: retired  Scientific laboratory technician  . Financial resource strain: Not hard at all  . Food insecurity:    Worry: Never true    Inability: Never true  . Transportation needs:    Medical: No    Non-medical: No  Tobacco Use  . Smoking status: Former Smoker    Packs/day: 0.50    Years: 25.00    Pack years: 12.50  Types: Cigarettes    Last attempt to quit: 11/16/2004    Years since quitting: 13.4  . Smokeless tobacco: Never Used  Substance and Sexual Activity  . Alcohol use: Yes    Alcohol/week: 0.0 oz    Comment: occas  . Drug use: No  . Sexual activity: Never  Lifestyle  . Physical activity:    Days per week: 1 day    Minutes per session: 90 min  . Stress: Only a little  Relationships  . Social connections:    Talks on phone: Twice a week    Gets together: Once a week    Attends religious service: More than 4 times per year    Active member of club or organization: No    Attends meetings of clubs or organizations: Never    Relationship status: Married  . Intimate partner violence:    Fear of current or ex partner: No     Emotionally abused: No    Physically abused: No    Forced sexual activity: No  Other Topics Concern  . Not on file  Social History Narrative   Diet- N/A   Caffeine- Yes   Married- Yes   House- 2 story with 2 people   Pets- No   Current/past profession- Nurse, mental health   Exercise- Yes   Living will-No   DNR-N/A   POA/HPOA-No       Family History  Problem Relation Age of Onset  . Stroke Sister   . Hypertension Mother   . Stroke Mother   . Alcohol abuse Brother   . HIV/AIDS Brother     No Known Allergies  Outpatient Encounter Medications as of 04/19/2018  Medication Sig  . anastrozole (ARIMIDEX) 1 MG tablet Take 1 tablet (1 mg total) by mouth daily.  Marland Kitchen aspirin EC 81 MG tablet Take 1 tablet (81 mg total) by mouth daily.  Marland Kitchen labetalol (NORMODYNE) 300 MG tablet TAKE 2 TABLETS BY MOUTH THREE TIMES DAILY  . NIFEdipine (PROCARDIA-XL/ADALAT CC) 60 MG 24 hr tablet Take 1 tablet (60 mg total) by mouth 2 (two) times daily. Please call to make appointment for further refills  . pravastatin (PRAVACHOL) 10 MG tablet Take 1 tablet (10 mg total) by mouth at bedtime.   No facility-administered encounter medications on file as of 04/19/2018.     Review of Systems:  Review of Systems  Unable to perform ROS: Other (memory loss)    Health Maintenance  Topic Date Due  . MAMMOGRAM  03/31/2018  . PAP SMEAR  11/16/2018 (Originally 08/03/1976)  . HIV Screening  11/16/2018 (Originally 08/03/1970)  . Hepatitis C Screening  11/24/2018 (Originally [birth year])  . TETANUS/TDAP  11/17/2019 (Originally 08/03/1974)  . INFLUENZA VACCINE  06/16/2018  . COLONOSCOPY  07/07/2018    Physical Exam: Vitals:   04/19/18 1337  BP: 134/80  Pulse: 82  Temp: 98.3 F (36.8 C)  TempSrc: Oral  SpO2: 96%  Weight: 198 lb (89.8 kg)  Height: 5\' 3"  (1.6 m)   Body mass index is 35.07 kg/m. Physical Exam  Constitutional: She appears well-developed and well-nourished.  Looks ill in NAD  HENT:  TMs dull b/l;  no sinus TTP; no boggy sinus tissue texture changes; nares with enlarged grey dry turbinates; oropharynx cobblestoning with post nasal drip noted; no redness; MMM; no oral thrush  Neck: Neck supple.  Cardiovascular: Normal rate, regular rhythm and intact distal pulses. Exam reveals no gallop and no friction rub.  Murmur (1/6 SEM) heard. Trace LE edema  b/l. No calf TTP  Pulmonary/Chest: Effort normal and breath sounds normal. No stridor. No respiratory distress. She has no wheezes. She has no rales. She exhibits no tenderness.  Musculoskeletal: She exhibits edema.  Neurological: She is alert.  Skin: Skin is warm and dry. No rash noted.  Psychiatric: She has a normal mood and affect. Thought content normal. She is aggressive.    Labs reviewed: Basic Metabolic Panel: Recent Labs    05/26/17 0932 11/24/17 0951  NA 140 140  K 3.6 4.3  CL 103 103  CO2 25 27  GLUCOSE 94 98  BUN 14 12  CREATININE 1.29* 1.15*  CALCIUM 9.7 9.9  TSH 1.37  --    Liver Function Tests: Recent Labs    05/26/17 0932 11/24/17 0951  AST 23 21  ALT 19 19  ALKPHOS 81  --   BILITOT 0.5 0.5  PROT 6.9 7.0  ALBUMIN 4.6  --    No results for input(s): LIPASE, AMYLASE in the last 8760 hours. No results for input(s): AMMONIA in the last 8760 hours. CBC: Recent Labs    05/26/17 0932 11/24/17 0951  WBC 4.2 4.4  NEUTROABS 2,562 2,702  HGB 12.4 12.3  HCT 37.5 36.1  MCV 93.3 90.5  PLT 309 321   Lipid Panel: Recent Labs    05/26/17 0932 11/24/17 0951  CHOL 175 178  HDL 65 77  LDLCALC 88 82  TRIG 108 98  CHOLHDL 2.7 2.3   No results found for: HGBA1C  Procedures since last visit: No results found.  Assessment/Plan   ICD-10-CM   1. Seasonal allergic rhinitis, unspecified trigger J30.2 predniSONE (DELTASONE) 10 MG tablet    montelukast (SINGULAIR) 10 MG tablet  2. Cervical cancer screening Z12.4 Ambulatory referral to Gynecology   DEPO-MEDROL 40MG  INJECTION GIVEN TODAY  START  SINGULAIR/MONTELUKAST 10MG  AT BEDTIME for seasonal allergy  Will call with GYN referral  Continue other medications as ordered  Follow up as scheduled or sooner if need be. She will wait to discuss other concerns at her next ov.  Edward Guthmiller S. Perlie Gold  [facility] [address] [place], [place] [address] [phone] Cell (Monday-Friday 8 AM - 5 PM) [phone] After 5 PM and follow prompts

## 2018-05-02 ENCOUNTER — Encounter: Payer: Self-pay | Admitting: Gynecology

## 2018-05-02 ENCOUNTER — Ambulatory Visit (INDEPENDENT_AMBULATORY_CARE_PROVIDER_SITE_OTHER): Payer: Medicare HMO | Admitting: Gynecology

## 2018-05-02 VITALS — BP 124/80 | Ht 63.0 in | Wt 197.0 lb

## 2018-05-02 DIAGNOSIS — Z01411 Encounter for gynecological examination (general) (routine) with abnormal findings: Secondary | ICD-10-CM | POA: Diagnosis not present

## 2018-05-02 DIAGNOSIS — Z124 Encounter for screening for malignant neoplasm of cervix: Secondary | ICD-10-CM | POA: Diagnosis not present

## 2018-05-02 DIAGNOSIS — Z9189 Other specified personal risk factors, not elsewhere classified: Secondary | ICD-10-CM

## 2018-05-02 DIAGNOSIS — N952 Postmenopausal atrophic vaginitis: Secondary | ICD-10-CM

## 2018-05-02 DIAGNOSIS — Z853 Personal history of malignant neoplasm of breast: Secondary | ICD-10-CM

## 2018-05-02 NOTE — Progress Notes (Signed)
    Michele Page 06/28/1956 [id number]        61 y.o.  G3P3 new patient for breast and pelvic exam.  Without gynecologic complaints.  Past medical history,surgical history, problem list, medications, allergies, family history and social history were all reviewed and documented as reviewed in the EPIC chart.  ROS:  Performed with pertinent positives and negatives included in the history, assessment and plan.   Additional significant findings : None   Exam: Caryn Bee assistant Vitals:   05/02/18 1505  BP: 124/80  Weight: 197 lb (89.4 kg)  Height: 5\' 3"  (1.6 m)   Body mass index is 34.9 kg/m.  General appearance:  Normal affect, orientation and appearance. Skin: Grossly normal HEENT: Without gross lesions.  No cervical or supraclavicular adenopathy. Thyroid normal.  Lungs:  Clear without wheezing, rales or rhonchi Cardiac: RR, without RMG Abdominal:  Soft, nontender, without masses, guarding, rebound, organomegaly or hernia Breasts:  Examined lying and sitting without masses, retractions, discharge or axillary adenopathy. Pelvic:  Ext, BUS, Vagina: With atrophic changes  Cervix: With atrophic changes.  Pap smear done  Uterus: Anteverted, normal size, shape and contour, midline and mobile nontender   Adnexa: Without masses or tenderness    Anus and perineum: Normal   Rectovaginal: Normal sphincter tone without palpated masses or tenderness.    Assessment/Plan:  62 y.o. G3P3 female for breast and pelvic exam.   1. Postmenopausal/atrophic genital changes.  No significant hot flushes, night sweats, vaginal dryness or any bleeding. 2. Mammography in the chart 2017.  Was diagnosed with breast cancer and underwent treatment to include radiation following.  Is currently on anastrozole.  Actively followed by oncology.  Exam today NED.  Is overdue for mammogram now and I strongly recommended her to schedule this and she agrees to call and schedule this. 3. Bone density reported 2016.  As  patient continues on anastrozole recommend scheduling baseline DEXA now when she will go ahead and do so. 4. Pap smear 2016.  Pap smear done today.  No history of abnormal Pap smears previously. 5. Colonoscopy 2016.  Repeat at their recommended interval. 6. Health maintenance.  No routine lab work done as patient does this elsewhere.  Follow-up 1 year, sooner as needed.   Anastasio Auerbach MD, 3:34 PM 05/02/2018

## 2018-05-02 NOTE — Patient Instructions (Signed)
Follow-up for the bone density as scheduled here in our office.  Schedule your mammogram:  [facility]. [address] [place] Phone: [phone]

## 2018-05-02 NOTE — Addendum Note (Signed)
Addended by: Nelva Nay on: 05/02/2018 03:49 PM   Modules accepted: Orders

## 2018-05-04 LAB — PAP IG W/ RFLX HPV ASCU

## 2018-05-08 ENCOUNTER — Encounter: Payer: Self-pay | Admitting: Internal Medicine

## 2018-05-10 ENCOUNTER — Other Ambulatory Visit: Payer: Self-pay | Admitting: *Deleted

## 2018-05-10 DIAGNOSIS — Z853 Personal history of malignant neoplasm of breast: Secondary | ICD-10-CM

## 2018-05-24 ENCOUNTER — Encounter: Payer: Self-pay | Admitting: Internal Medicine

## 2018-05-24 ENCOUNTER — Ambulatory Visit
Admission: RE | Admit: 2018-05-24 | Discharge: 2018-05-24 | Disposition: A | Discharge status: Home / Self Care | Payer: Medicare HMO | Source: Ambulatory Visit | Attending: Internal Medicine | Admitting: Internal Medicine

## 2018-05-24 ENCOUNTER — Ambulatory Visit (INDEPENDENT_AMBULATORY_CARE_PROVIDER_SITE_OTHER): Payer: Medicare HMO | Admitting: Internal Medicine

## 2018-05-24 VITALS — BP 122/78 | HR 60 | Temp 98.4°F | Ht 63.0 in | Wt 197.0 lb

## 2018-05-24 DIAGNOSIS — M25511 Pain in right shoulder: Secondary | ICD-10-CM | POA: Diagnosis not present

## 2018-05-24 MED ORDER — HYDROCODONE-ACETAMINOPHEN 5-325 MG PO TABS: 1.0000 | ORAL_TABLET | Freq: Four times a day (QID) | ORAL | 0 refills | Status: DC | PRN

## 2018-05-24 NOTE — Patient Instructions (Addendum)
NO HEAVY LIFTING WITH RIGHT ARM UNTIL SEEN BY ORTHO  GO GET XRAYS TODAY  START [place] (HYDROCODONE) EVERY 6 HRS AS NEEDED FOR PAIN - no driving or using heavy machinery while on medication  May use ice to right shoulder as needed for pain and swelling  Continue other medications as ordered  Will call with Ortho referral  Follow up as scheduled   Shoulder Pain Many things can cause shoulder pain, including:  An injury.  Moving the arm in the same way again and again (overuse).  Joint pain (arthritis).  Follow these instructions at home: Take these actions to help with your pain:  Squeeze a soft ball or a foam pad as much as you can. This helps to prevent swelling. It also makes the arm stronger.  Take over-the-counter and prescription medicines only as told by your doctor.  If told, put ice on the area: ? Put ice in a plastic bag. ? Place a towel between your skin and the bag. ? Leave the ice on for 20 minutes, 2-3 times per day. Stop putting on ice if it does not help with the pain.  If you were given a shoulder sling or immobilizer: ? Wear it as told. ? Remove it to shower or bathe. ? Move your arm as little as possible. ? Keep your hand moving. This helps prevent swelling.  Contact a doctor if:  Your pain gets worse.  Medicine does not help your pain.  You have new pain in your arm, hand, or fingers. Get help right away if:  Your arm, hand, or fingers: ? Tingle. ? Are numb. ? Are swollen. ? Are painful. ? Turn white or blue. This information is not intended to replace advice given to you by your health care provider. Make sure you discuss any questions you have with your health care provider. Document Released: 04/20/2008 Document Revised: 06/28/2016 Document Reviewed: 02/25/2015 Elsevier Interactive Patient Education  [employer].

## 2018-05-24 NOTE — Progress Notes (Signed)
Patient ID: Michele Page, female   DOB: 1956/07/19, 62 y.o.   MRN: [id number]   [facility] OFFICE  Provider: DR Arletha Grippe  Code Status:  Goals of Care:  Advanced Directives 01/25/2018  Does Patient Have a Medical Advance Directive? No  Would patient like information on creating a medical advance directive? -     Chief Complaint  Patient presents with  . Acute Visit    Pt has pain at right shoulder, underarm, and upper right arm for over 2 weeks.     HPI: Patient is a 62 y.o. female seen today for an acute visit for right shoulder pain x 2 weeks. She states pain began after lifting heavy garbage bag and throwing it into garbage can. No falls. No relief with tylenol or warm compresses. She had tingling in hand but has resolved. She has hx CVA with right hemiparesis. Pain interrupts sleep.  Past Medical History:  Diagnosis Date  . Arthritis    lt knee  . Breast cancer ([place])   . Breast cancer of upper-outer quadrant of left female breast ([place]) 04/02/2016  . Colon polyps   . COPD (chronic obstructive pulmonary disease) ([place])   . Hyperlipidemia   . Hypertension   . Multiple sclerosis ([place])   . Stroke ([place])    2013, mild cognitive deficits  . Vision abnormalities     Past Surgical History:  Procedure Laterality Date  . HYSTEROSCOPY W/D&C N/A 12/19/2015   Procedure: DILATATION AND CURETTAGE /HYSTEROSCOPY;  Surgeon: Linda Hedges, DO;  Location: [facility];  Service: Gynecology;  Laterality: N/A;  . RADIOACTIVE SEED GUIDED PARTIAL MASTECTOMY WITH AXILLARY SENTINEL LYMPH NODE BIOPSY Left 04/17/2016   Procedure: RADIOACTIVE SEED GUIDED PARTIAL MASTECTOMY WITH AXILLARY SENTINEL LYMPH NODE BIOPSY;  Surgeon: Erroll Luna, MD;  Location: [facility];  Service: General;  Laterality: Left;  RADIOACTIVE SEED GUIDED PARTIAL MASTECTOMY WITH AXILLARY SENTINEL LYMPH NODE BIOPSY   . TUBAL LIGATION    . VAGINAL DELIVERY     x3     reports that she quit smoking about 13 years ago. Her  smoking use included cigarettes. She has a 12.50 pack-year smoking history. She has never used smokeless tobacco. She reports that she drinks alcohol. She reports that she does not use drugs. Social History   Socioeconomic History  . Marital status: Married    Spouse name: Not on file  . Number of children: 3  . Years of education: Not on file  . Highest education level: Not on file  Occupational History  . Occupation: retired  Scientific laboratory technician  . Financial resource strain: Not hard at all  . Food insecurity:    Worry: Never true    Inability: Never true  . Transportation needs:    Medical: No    Non-medical: No  Tobacco Use  . Smoking status: Former Smoker    Packs/day: 0.50    Years: 25.00    Pack years: 12.50    Types: Cigarettes    Last attempt to quit: 11/16/2004    Years since quitting: 13.5  . Smokeless tobacco: Never Used  Substance and Sexual Activity  . Alcohol use: Yes    Alcohol/week: 0.0 oz    Comment: occas  . Drug use: No  . Sexual activity: Yes    Comment: 1st intercourse 29 yo-5 partners  Lifestyle  . Physical activity:    Days per week: 1 day    Minutes per session: 90 min  . Stress: Only a little  Relationships  . Social connections:    Talks on phone: Twice a week    Gets together: Once a week    Attends religious service: More than 4 times per year    Active member of club or organization: No    Attends meetings of clubs or organizations: Never    Relationship status: Married  . Intimate partner violence:    Fear of current or ex partner: No    Emotionally abused: No    Physically abused: No    Forced sexual activity: No  Other Topics Concern  . Not on file  Social History Narrative   Diet- N/A   Caffeine- Yes   Married- Yes   House- 2 story with 2 people   Pets- No   Current/past profession- Nurse, mental health   Exercise- Yes   Living will-No   DNR-N/A   POA/HPOA-No       Family History  Problem Relation Age of Onset  . Stroke  Sister   . Hypertension Mother   . Stroke Mother   . Alcohol abuse Brother   . HIV/AIDS Brother     No Known Allergies  Outpatient Encounter Medications as of 05/24/2018  Medication Sig  . anastrozole (ARIMIDEX) 1 MG tablet Take 1 tablet (1 mg total) by mouth daily.  Marland Kitchen aspirin EC 81 MG tablet Take 1 tablet (81 mg total) by mouth daily.  Marland Kitchen labetalol (NORMODYNE) 300 MG tablet TAKE 2 TABLETS BY MOUTH THREE TIMES DAILY  . NIFEdipine (PROCARDIA-XL/ADALAT CC) 60 MG 24 hr tablet Take 1 tablet (60 mg total) by mouth 2 (two) times daily.  . pravastatin (PRAVACHOL) 10 MG tablet Take 1 tablet (10 mg total) by mouth at bedtime.   No facility-administered encounter medications on file as of 05/24/2018.     Review of Systems:  Review of Systems  Unable to perform ROS: Other (memory loss)    Health Maintenance  Topic Date Due  . MAMMOGRAM  03/31/2018  . HIV Screening  11/16/2018 (Originally 08/03/1970)  . Hepatitis C Screening  11/24/2018 (Originally 02-08-1956)  . TETANUS/TDAP  11/17/2019 (Originally 08/03/1974)  . INFLUENZA VACCINE  06/16/2018  . COLONOSCOPY  07/07/2018  . PAP SMEAR  05/02/2021    Physical Exam: Vitals:   05/24/18 1303  BP: 122/78  Pulse: 60  Temp: 98.4 F (36.9 C)  TempSrc: Oral  SpO2: 97%  Weight: 197 lb (89.4 kg)  Height: 5\' 3"  (1.6 m)   Body mass index is 34.9 kg/m. Physical Exam  Constitutional: She appears well-developed and well-nourished.  Looks uncomfortable  Musculoskeletal: She exhibits edema and tenderness. She exhibits no deformity.       Right shoulder: She exhibits decreased range of motion (in all directions), tenderness, swelling and crepitus (with ROM).       Arms: (+) Apley scratch test on right;  Neurological: She is alert.  Strength 4/5 in RUE  Skin: Skin is warm and dry. No rash noted.  Psychiatric: She has a normal mood and affect. Her behavior is normal. Thought content normal.    Labs reviewed: Basic Metabolic Panel: Recent Labs      05/26/17 0932 11/24/17 0951  NA 140 140  K 3.6 4.3  CL 103 103  CO2 25 27  GLUCOSE 94 98  BUN 14 12  CREATININE 1.29* 1.15*  CALCIUM 9.7 9.9  TSH 1.37  --    Liver Function Tests: Recent Labs    05/26/17 0932 11/24/17 0951  AST 23 21  ALT  19 19  ALKPHOS 81  --   BILITOT 0.5 0.5  PROT 6.9 7.0  ALBUMIN 4.6  --    No results for input(s): LIPASE, AMYLASE in the last 8760 hours. No results for input(s): AMMONIA in the last 8760 hours. CBC: Recent Labs    05/26/17 0932 11/24/17 0951  WBC 4.2 4.4  NEUTROABS 2,562 2,702  HGB 12.4 12.3  HCT 37.5 36.1  MCV 93.3 90.5  PLT 309 321   Lipid Panel: Recent Labs    05/26/17 0932 11/24/17 0951  CHOL 175 178  HDL 65 77  LDLCALC 88 82  TRIG 108 98  CHOLHDL 2.7 2.3   No results found for: HGBA1C  Procedures since last visit: No results found.  Assessment/Plan   ICD-10-CM   1. Acute pain of right shoulder M25.511 HYDROcodone-acetaminophen (NORCO) 5-325 MG tablet    DG Shoulder Right    Ambulatory referral to Orthopedic Surgery   Handout on shoulder pain  NO HEAVY LIFTING WITH RIGHT ARM UNTIL SEEN BY ORTHO  GO GET XRAYS TODAY  START NORCO (HYDROCODONE) EVERY 6 HRS AS NEEDED FOR PAIN - no driving or using heavy machinery while on medication  May use ice to right shoulder as needed for pain and swelling  Continue other medications as ordered  Will call with Ortho referral  Follow up as scheduled   Nayvie Lips S. Perlie Gold  [facility] [address] [place], [place] [address] [phone] Cell (Monday-Friday 8 AM - 5 PM) [phone] After 5 PM and follow prompts

## 2018-06-01 ENCOUNTER — Ambulatory Visit (INDEPENDENT_AMBULATORY_CARE_PROVIDER_SITE_OTHER): Payer: Self-pay

## 2018-06-01 ENCOUNTER — Ambulatory Visit (INDEPENDENT_AMBULATORY_CARE_PROVIDER_SITE_OTHER): Payer: Medicare HMO

## 2018-06-01 ENCOUNTER — Encounter (INDEPENDENT_AMBULATORY_CARE_PROVIDER_SITE_OTHER): Payer: Self-pay | Admitting: Orthopaedic Surgery

## 2018-06-01 ENCOUNTER — Ambulatory Visit (INDEPENDENT_AMBULATORY_CARE_PROVIDER_SITE_OTHER): Payer: Medicare HMO | Admitting: Orthopaedic Surgery

## 2018-06-01 DIAGNOSIS — M546 Pain in thoracic spine: Secondary | ICD-10-CM | POA: Diagnosis not present

## 2018-06-01 DIAGNOSIS — M542 Cervicalgia: Secondary | ICD-10-CM

## 2018-06-01 MED ORDER — PREDNISONE 10 MG (21) PO TBPK: ORAL_TABLET | ORAL | 0 refills | Status: DC

## 2018-06-01 MED ORDER — TIZANIDINE HCL 2 MG PO CAPS: ORAL_CAPSULE | ORAL | 1 refills | Status: DC

## 2018-06-01 NOTE — Progress Notes (Signed)
Office Visit Note   Patient: Michele Page           Date of Birth: Oct 09, 1956           MRN: [id number] Visit Date: 06/01/2018              Requested by: Gildardo Cranker, [place], [place] [id number] PCP: Gildardo Cranker, DO    Assessment & Plan: Visit Diagnoses:  1. Neck pain on right side   2. Acute right-sided thoracic back pain     Plan: Impression is cervical spine radiculopathy.  At this point, we will start the patient on a steroid taper and muscle relaxer.  We will also start the patient in formal physical therapy.  She will follow-up with [place] in 6 weeks time if she is not any better.  Call with concerns or questions in the meantime.  Follow-Up Instructions: Return if symptoms worsen or fail to improve.   Orders:  Orders Placed This Encounter  Procedures  . XR Cervical Spine 2 or 3 views   No orders of the defined types were placed in this encounter.     Procedures: No procedures performed   Clinical Data: No additional findings.   Subjective: Chief Complaint  Patient presents with  . Neck - Pain  . Middle Back - Pain  . Right Shoulder - Pain    HPI patient is a pleasant 62 year old female who presents to our clinic today with right-sided neck and shoulder pain.  This began a few weeks ago when she was taking out the trash at work.  She is unsure if she awkwardly twisted her neck while throwing the trash bag away.  Since then she has had pain.  Her pain is to the right lateral neck radiating down the right upper arm and into the parascapular region on the right.  Pain is worse when she rotates her head to the right.  No pain with movement of the shoulder.  No pain sleeping on the affected side.  She has tried over-the-counter medications without relief of symptoms.  No numbness, tingling or burning to the right upper extremity.  No previous neck pathology.  Review of Systems as detailed in HPI.  All others reviewed and are  negative.   Objective: Vital Signs: There were no vitals taken for this visit.  Physical Exam well-developed and well-nourished female in no acute distress.  Alert and oriented x3.  Ortho Exam examination of her neck reveals no spinous or paraspinous tenderness.  She does have tenderness to the lateral aspect of her neck.  She has tenderness to the right parascapular region as well.  Pain with right-sided rotation of the neck.  Normal shoulder exam on the right.  She is neurovascularly intact distally.  Specialty Comments:  No specialty comments available.  Imaging: Xr Cervical Spine 2 Or 3 Views  Result Date: 06/01/2018 Multilevel degenerative disc disease with anterior spurring worse at c4-6    PMFS History: Patient Active Problem List   Diagnosis Date Noted  . Neck pain on right side 06/01/2018  . Left shoulder pain 11/25/2017  . Benign paroxysmal positional vertigo 11/25/2017  . History of breast cancer 11/25/2017  . Insomnia 11/25/2017  . Breast cancer of upper-outer quadrant of left female breast ([place] [facility]) 04/02/2016  . Essential hypertension 01/10/2016  . 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 Gastroesophageal reflux disease without esophagitis 01/10/2016  . Hyperlipidemia 01/10/2016  . CKD (chronic kidney  disease) 01/10/2016  . Multiple sclerosis ([place]) 03/20/2015  . Accelerated hypertension 03/20/2015  . CVA (cerebral infarction) 03/20/2015  . Hemiplegia following CVA (cerebrovascular accident) ([place]) 03/20/2015  . Cognitive disorder 03/20/2015  . Nocturia 03/20/2015  . Snoring 03/20/2015  . Aphasia S/P CVA 03/20/2015  . 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 Arthritis    lt knee  . Breast cancer ([place] [facility])   . Breast cancer of upper-outer quadrant of left female breast ([place]) 04/02/2016  . Colon polyps   . COPD (chronic obstructive pulmonary disease) ([place])   .  Hyperlipidemia   . Hypertension   . Multiple sclerosis ([place])   . Stroke ([facility])    2013, mild cognitive deficits  . Vision abnormalities     Family History  Problem Relation Age of Onset  . Stroke Sister   . Hypertension Mother   . Stroke Mother   . Alcohol abuse Brother   . HIV/AIDS Brother     Past Surgical History:  Procedure Laterality Date  . HYSTEROSCOPY W/D&C N/A 12/19/2015   Procedure: DILATATION AND CURETTAGE /HYSTEROSCOPY;  Surgeon: Linda Hedges, DO;  Location: [facility];  Service: Gynecology;  Laterality: N/A;  . RADIOACTIVE SEED GUIDED PARTIAL MASTECTOMY WITH AXILLARY SENTINEL LYMPH NODE BIOPSY Left 04/17/2016   Procedure: RADIOACTIVE SEED GUIDED PARTIAL MASTECTOMY WITH AXILLARY SENTINEL LYMPH NODE BIOPSY;  Surgeon: Erroll Luna, MD;  Location: [facility];  Service: General;  Laterality: Left;  RADIOACTIVE SEED GUIDED PARTIAL MASTECTOMY WITH AXILLARY SENTINEL LYMPH NODE BIOPSY   . TUBAL LIGATION    . VAGINAL DELIVERY     x3   Social History   Occupational History  . Occupation: retired  Tobacco Use  . Smoking status: Former Smoker    Packs/day: 0.50    Years: 25.00    Pack years: 12.50    Types: Cigarettes    Last attempt to quit: 11/16/2004    Years since quitting: 13.5  . Smokeless tobacco: Never Used  Substance and Sexual Activity  . Alcohol use: Yes    Alcohol/week: 0.0 oz    Comment: occas  . Drug use: No  . Sexual activity: Yes    Comment: 1st intercourse 13 yo-5 partners

## 2018-06-24 ENCOUNTER — Other Ambulatory Visit: Payer: Medicare HMO

## 2018-06-24 DIAGNOSIS — E782 Mixed hyperlipidemia: Secondary | ICD-10-CM

## 2018-06-24 DIAGNOSIS — I10 Essential (primary) hypertension: Secondary | ICD-10-CM

## 2018-06-24 DIAGNOSIS — Z853 Personal history of malignant neoplasm of breast: Secondary | ICD-10-CM | POA: Diagnosis not present

## 2018-06-25 LAB — CBC WITH DIFFERENTIAL/PLATELET
Basophils Absolute: 31 cells/uL (ref 0–200)
Basophils Relative: 0.7 %
Eosinophils Absolute: 48 cells/uL (ref 15–500)
Eosinophils Relative: 1.1 %
HEMATOCRIT: 34.7 % — AB (ref 35.0–45.0)
Hemoglobin: 11.7 g/dL (ref 11.7–15.5)
Lymphs Abs: 1157 cells/uL (ref 850–3900)
MCH: 31.3 pg (ref 27.0–33.0)
MCHC: 33.7 g/dL (ref 32.0–36.0)
MCV: 92.8 fL (ref 80.0–100.0)
MPV: 10.3 fL (ref 7.5–12.5)
Monocytes Relative: 6.4 %
NEUTROS PCT: 65.5 %
Neutro Abs: 2882 cells/uL (ref 1500–7800)
Platelets: 293 10*3/uL (ref 140–400)
RBC: 3.74 10*6/uL — AB (ref 3.80–5.10)
RDW: 13.3 % (ref 11.0–15.0)
Total Lymphocyte: 26.3 %
WBC: 4.4 10*3/uL (ref 3.8–10.8)
WBCMIX: 282 {cells}/uL (ref 200–950)

## 2018-06-25 LAB — URINALYSIS, ROUTINE W REFLEX MICROSCOPIC
Bilirubin Urine: NEGATIVE
Glucose, UA: NEGATIVE
HGB URINE DIPSTICK: NEGATIVE
Nitrite: POSITIVE — AB
Specific Gravity, Urine: 1.042 — ABNORMAL HIGH (ref 1.001–1.03)
pH: 5.5 (ref 5.0–8.0)

## 2018-06-25 LAB — COMPLETE METABOLIC PANEL WITH GFR
AG Ratio: 2 (calc) (ref 1.0–2.5)
ALBUMIN MSPROF: 4.5 g/dL (ref 3.6–5.1)
ALT: 17 U/L (ref 6–29)
AST: 19 U/L (ref 10–35)
Alkaline phosphatase (APISO): 79 U/L (ref 33–130)
BUN / CREAT RATIO: 11 (calc) (ref 6–22)
BUN: 17 mg/dL (ref 7–25)
CO2: 24 mmol/L (ref 20–32)
CREATININE: 1.53 mg/dL — AB (ref 0.50–0.99)
Calcium: 9.5 mg/dL (ref 8.6–10.4)
Chloride: 107 mmol/L (ref 98–110)
GFR, Est African American: 42 mL/min/{1.73_m2} — ABNORMAL LOW (ref >=60)
GFR, Est Non African American: 36 mL/min/{1.73_m2} — ABNORMAL LOW (ref >=60)
GLUCOSE: 106 mg/dL — AB (ref 65–99)
Globulin: 2.3 g/dL (calc) (ref 1.9–3.7)
Potassium: 3.9 mmol/L (ref 3.5–5.3)
Sodium: 143 mmol/L (ref 135–146)
TOTAL PROTEIN: 6.8 g/dL (ref 6.1–8.1)
Total Bilirubin: 0.5 mg/dL (ref 0.2–1.2)

## 2018-06-25 LAB — LIPID PANEL
Cholesterol: 205 mg/dL — ABNORMAL HIGH (ref <200)
HDL: 65 mg/dL (ref >50)
LDL Cholesterol (Calc): 121 mg/dL (calc) — ABNORMAL HIGH
Non-HDL Cholesterol (Calc): 140 mg/dL (calc) — ABNORMAL HIGH (ref <130)
Total CHOL/HDL Ratio: 3.2 (calc) (ref <5.0)
Triglycerides: 88 mg/dL (ref <150)

## 2018-06-25 LAB — TSH: TSH: 1.7 m[IU]/L (ref 0.40–4.50)

## 2018-06-27 ENCOUNTER — Telehealth: Payer: Self-pay

## 2018-06-27 MED ORDER — NITROFURANTOIN MONOHYD MACRO 100 MG PO CAPS: 100.0000 mg | ORAL_CAPSULE | Freq: Two times a day (BID) | ORAL | 0 refills | Status: DC

## 2018-06-27 NOTE — Telephone Encounter (Signed)
-----   Message from [place], [place] sent at 06/25/2018 10:49 PM EDT ----- (+) UTI - start macrobid 100mg  #6 take 1 tab po BID x 3 days; push fluids; cholesterol is worse - is she taking all medication as ordered; kidney function is worse - drink more water; other labs stable; follow up as scheduled

## 2018-06-27 NOTE — Telephone Encounter (Signed)
Discussed results with patient, patient verbalized understanding of results  RX sent to pharmacy on file  Patient will further discuss with Dr.Carter tomorrow

## 2018-06-28 ENCOUNTER — Encounter: Payer: Self-pay | Admitting: Internal Medicine

## 2018-06-28 ENCOUNTER — Ambulatory Visit (INDEPENDENT_AMBULATORY_CARE_PROVIDER_SITE_OTHER): Payer: Medicare HMO | Admitting: Internal Medicine

## 2018-06-28 VITALS — BP 122/70 | HR 86 | Temp 98.5°F | Resp 14 | Ht 63.0 in | Wt 196.0 lb

## 2018-06-28 DIAGNOSIS — I10 Essential (primary) hypertension: Secondary | ICD-10-CM

## 2018-06-28 DIAGNOSIS — E782 Mixed hyperlipidemia: Secondary | ICD-10-CM

## 2018-06-28 DIAGNOSIS — K219 Gastro-esophageal reflux disease without esophagitis: Secondary | ICD-10-CM

## 2018-06-28 DIAGNOSIS — I6930 Unspecified sequelae of cerebral infarction: Secondary | ICD-10-CM | POA: Diagnosis not present

## 2018-06-28 DIAGNOSIS — G35D Multiple sclerosis, unspecified: Secondary | ICD-10-CM

## 2018-06-28 DIAGNOSIS — N3000 Acute cystitis without hematuria: Secondary | ICD-10-CM

## 2018-06-28 DIAGNOSIS — G35 Multiple sclerosis: Secondary | ICD-10-CM | POA: Diagnosis not present

## 2018-06-28 NOTE — Progress Notes (Signed)
Patient ID: Michele Page, female   DOB: 04-05-1956, 62 y.o.   MRN: [id number]   Location:  [facility] OFFICE  Provider: DR Arletha Grippe  Code Status:  Goals of Care:  Advanced Directives 01/25/2018  Does Patient Have a Medical Advance Directive? No  Would patient like information on creating a medical advance directive? -     Chief Complaint  Patient presents with  . Follow-up    due for mammo    HPI: Patient is a 62 y.o. female seen today for medical management of chronic diseases.  She plans to start abx today for UTI. She reports no sx's. She does not drink a lot of water. UA revealed sp gravity 1.042 with 2+ protein, (+) nitrites and 1+ leuks (10-20 WBC, 6-10 epithelial cells, few bacteria, 1-3 hyaline cast).  Discussed other lab results from 06/24/18. LDL 82>>>121. She was out of statin x 2 weeks while on vacation in [place]. She has since resumed medicine.  Left knee pain - stable. She did f/u with Ortho for 2nd opinion and had fluid aspirated which helped. She wears a knee brace prn which also helps. In the past, she has rec'd knee injection by Ortho that she states made her pain worse and she has gait dysfunction due to knee. She no longer takes any medication for it. MRI done in March 2018 revealed large joint effusion and tricompartmental cartilage abnormalities, and lateral femoral condyle friction syndrome.   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 She completed XRT in June 2018. She was told no chemotx needed. On femara from 04/2017- Dec 2018 and changed to tamoxifen due to hair loss concerns. Arimidex caused hair loss but she is now taking it again. Followed by Dr Lindi Adie.   Insomnia - stable. She stopped taking lunesta.  HTN - stable on labetalol and procardia. Followed by cardio Dr Gwenlyn Found. She takes ASA daily  Hyperlipidemia - controlled on pravastatin. LDL 82. No myalgias  COPD - stable. no exacerbations. No issues with  breathing  Hx CVA - stable. Takes statin and ASA daily. LDL 82. Followed by Dr Felecia Shelling. MRI brain in Apr 2018 revealed remote hemorrhagic infarct in left basal ganglia and thalamus. She has expressive aphasia.  MS - no changes. asymptomatic. no diplopia but she cannot "stare" for prolonged time without blinking. Followed by Neurology Dr Felecia Shelling. No exacerbations. MRI brain in Apr 2018 revealed b/l nonspecific extensive periventricular and subcortical white matter diseasel.  Gastritis/GERD - stable off nexium.  She had a colonoscopy last yr and tubular adenoma polyps removed. Denies acid reflux sx's.  CKD - stage 3. probably due to HTN. Cr 1.15   Past Medical History:  Diagnosis Date  . Arthritis    lt knee  . Breast cancer ([place])   . Breast cancer of upper-outer quadrant of left female breast ([facility]) 04/02/2016  . Colon polyps   . COPD (chronic obstructive pulmonary disease) ([place])   . Hyperlipidemia   . Hypertension   . Multiple sclerosis ([place])   . Stroke ([facility])    2013, mild cognitive deficits  . Vision abnormalities     Past Surgical History:  Procedure Laterality Date  . HYSTEROSCOPY W/D&C N/A 12/19/2015   Procedure: DILATATION AND CURETTAGE /HYSTEROSCOPY;  Surgeon: Linda Hedges, DO;  Location: [facility];  Service: Gynecology;  Laterality: N/A;  . RADIOACTIVE SEED GUIDED PARTIAL MASTECTOMY WITH AXILLARY SENTINEL LYMPH NODE BIOPSY Left 04/17/2016   Procedure:  RADIOACTIVE SEED GUIDED PARTIAL MASTECTOMY WITH AXILLARY SENTINEL LYMPH NODE BIOPSY;  Surgeon: Erroll Luna, MD;  Location: [facility];  Service: General;  Laterality: Left;  RADIOACTIVE SEED GUIDED PARTIAL MASTECTOMY WITH AXILLARY SENTINEL LYMPH NODE BIOPSY   . TUBAL LIGATION    . VAGINAL DELIVERY     x3     reports that she quit smoking about 13 years ago. Her smoking use included cigarettes. She has a 12.50 pack-year smoking history. She has never used smokeless tobacco. She reports that she drinks alcohol. She  reports that she does not use drugs. Social History   Socioeconomic History  . Marital status: Married    Spouse name: Not on file  . Number of children: 3  . Years of education: Not on file  . Highest education level: Not on file  Occupational History  . Occupation: retired  Scientific laboratory technician  . Financial resource strain: Not hard at all  . Food insecurity:    Worry: Never true    Inability: Never true  . Transportation needs:    Medical: No    Non-medical: No  Tobacco Use  . Smoking status: Former Smoker    Packs/day: 0.50    Years: 25.00    Pack years: 12.50    Types: Cigarettes    Last attempt to quit: 11/16/2004    Years since quitting: 13.6  . Smokeless tobacco: Never Used  Substance and Sexual Activity  . Alcohol use: Yes    Alcohol/week: 0.0 standard drinks    Comment: occas  . Drug use: No  . Sexual activity: Yes    Comment: 1st intercourse 22 yo-5 partners  Lifestyle  . Physical activity:    Days per week: 1 day    Minutes per session: 90 min  . Stress: Only a little  Relationships  . Social connections:    Talks on phone: Twice a week    Gets together: Once a week    Attends religious service: More than 4 times per year    Active member of club or organization: No    Attends meetings of clubs or organizations: Never    Relationship status: Married  . Intimate partner violence:    Fear of current or ex partner: No    Emotionally abused: No    Physically abused: No    Forced sexual activity: No  Other Topics Concern  . Not on file  Social History Narrative   Diet- N/A   Caffeine- Yes   Married- Yes   House- 2 story with 2 people   Pets- No   Current/past profession- Nurse, mental health   Exercise- Yes   Living will-No   DNR-N/A   POA/HPOA-No       Family History  Problem Relation Age of Onset  . Stroke Sister   . Hypertension Mother   . Stroke Mother   . Alcohol abuse Brother   . HIV/AIDS Brother     No Known Allergies  Outpatient  Encounter Medications as of 06/28/2018  Medication Sig  . anastrozole (ARIMIDEX) 1 MG tablet Take 1 tablet (1 mg total) by mouth daily.  [place] Kitchen aspirin EC 81 MG tablet Take 1 tablet (81 mg total) by mouth daily.  Marland Kitchen labetalol (NORMODYNE) 300 MG tablet TAKE 2 TABLETS BY MOUTH THREE TIMES DAILY  . NIFEdipine (PROCARDIA-XL/ADALAT CC) 60 MG 24 hr tablet Take 1 tablet (60 mg total) by mouth 2 (two) times daily.  . nitrofurantoin, macrocrystal-monohydrate, (MACROBID) 100 MG capsule Take  1 capsule (100 mg total) by mouth 2 (two) times daily.  . pravastatin (PRAVACHOL) 10 MG tablet Take 1 tablet (10 mg total) by mouth at bedtime.  . [DISCONTINUED] HYDROcodone-acetaminophen (NORCO) 5-325 MG tablet Take 1 tablet by mouth every 6 (six) hours as needed for moderate pain.  . [DISCONTINUED] predniSONE (STERAPRED UNI-PAK 21 TAB) 10 MG (21) TBPK tablet Take as directed  . [DISCONTINUED] tizanidine (ZANAFLEX) 2 MG capsule Take one tab po tid prn pain   No facility-administered encounter medications on file as of 06/28/2018.     Review of Systems:  Review of Systems  Unable to perform ROS: Other (expressive aphasia)    Health Maintenance  Topic Date Due  . MAMMOGRAM  03/31/2018  . INFLUENZA VACCINE  06/16/2018  . HIV Screening  11/16/2018 (Originally 08[birth year]03/1970)  . Hepatitis C Screening  11/24/2018 (Originally 1956/01/16)  . TETANUS/TDAP  11/17/2019 (Originally 08/03/1974)  . COLONOSCOPY  07/07/2018  . PAP SMEAR  05/02/2021    Physical Exam: Vitals:   06/28/18 1007  BP: 122/70  Pulse: 86  Resp: 14  Temp: 98.5 F (36.9 C)  TempSrc: Oral  SpO2: 93%  Weight: 196 lb (88.9 kg)  Height: 5' 3" (1.6 m)   Body mass index is 34.72 kg/m. Physical Exam  Constitutional: She is oriented to person, place, and time. She appears well-developed and well-nourished.  HENT:  Mouth/Throat: Oropharynx is clear and moist. No oropharyngeal exudate.  MMM; no oral thrush  Eyes: Pupils are equal, round, and reactive to  light. No scleral icterus.  Neck: Neck supple. Carotid bruit is not present. No tracheal deviation present. No thyromegaly present.  Cardiovascular: Normal rate, regular rhythm and intact distal pulses. Exam reveals no gallop and no friction rub.  Murmur (1/6 SEM) heard. No LE edema b/l. no calf TTP.   Pulmonary/Chest: Effort normal and breath sounds normal. No stridor. No respiratory distress. She has no wheezes. She has no rales.  Abdominal: Soft. Normal appearance and bowel sounds are normal. She exhibits no distension and no mass. There is no hepatomegaly. There is tenderness (epigastric). There is no rigidity, no rebound and no guarding. No hernia.  obese  Musculoskeletal: She exhibits edema (small and large joints).  Lymphadenopathy:    She has no cervical adenopathy.  Neurological: She is alert and oriented to person, place, and time. She has normal reflexes.  Skin: Skin is warm and dry. No rash noted.  Psychiatric: She has a normal mood and affect. Her behavior is normal. Judgment and thought content normal. Her speech is slurred.  Expressive aphasia    Labs reviewed: Basic Metabolic Panel: Recent Labs    11/24/17 0951 06/24/18 1045  NA 140 143  K 4.3 3.9  CL 103 107  CO2 27 24  GLUCOSE 98 106*  BUN 12 17  CREATININE 1.15* 1.53*  CALCIUM 9.9 9.5  TSH  --  1.70   Liver Function Tests: Recent Labs    11/24/17 0951 06/24/18 1045  AST 21 19  ALT 19 17  BILITOT 0.5 0.5  PROT 7.0 6.8   No results for input(s): LIPASE, AMYLASE in the last 8760 hours. No results for input(s): AMMONIA in the last 8760 hours. CBC: Recent Labs    11/24/17 0951 06/24/18 1045  WBC 4.4 4.4  NEUTROABS 2,702 2,882  HGB 12.3 11.7  HCT 36.1 34.7*  MCV 90.5 92.8  PLT 321 293   Lipid Panel: Recent Labs    11/24/17 0951 06/24/18 1045  CHOL 178 205*  HDL 77 65  LDLCALC 82 121*  TRIG 98 88  CHOLHDL 2.3 3.2   No results found for: HGBA1C  Procedures since last visit: Xr Cervical  Spine 2 Or 3 Views  Result Date: 06/01/2018 Multilevel degenerative disc disease with anterior spurring worse at c4-6   Assessment/Plan   ICD-10-CM   1. Mixed hyperlipidemia E78.2 Lipid Panel  2. Acute cystitis without hematuria N30.00   3. Gastroesophageal reflux disease without esophagitis K21.9   4. History of stroke with residual deficit I69.30   5. Essential hypertension I10 BMP with eGFR([facility])    ALT  6. Multiple sclerosis ([name]) G35    TAKE NITROFURANTOIN 2 TIMES DAILY FOR BLADDER INFECTION X 3 DAYS  Continue other medications as ordered  Watch fatty foods  Increase exercise as tolerated   Drink plenty of water (at least 32 ounces per day)  Follow up in 3 mos with [name] for  HTN, hyperlipidemia. Fasting labs prior to visit    Michele Page  [facility] [address] [place], [place] [address] [phone] Cell (Monday-Friday 8 AM - 5 PM) [phone] After 5 PM and follow prompts

## 2018-06-28 NOTE — Patient Instructions (Addendum)
TAKE NITROFURANTOIN 2 TIMES DAILY FOR BLADDER INFECTION X 3 DAYS  Continue other medications as ordered  Watch fatty foods  Increase exercise as tolerated   Drink plenty of water (at least 32 ounces per day)  Follow up in 3 mos with [name] for  HTN, hyperlipidemia. Fasting labs prior to visit

## 2018-07-06 ENCOUNTER — Encounter: Payer: Self-pay | Admitting: Internal Medicine

## 2018-07-11 ENCOUNTER — Telehealth: Payer: Self-pay | Admitting: Cardiovascular Disease

## 2018-07-11 ENCOUNTER — Other Ambulatory Visit: Payer: Self-pay | Admitting: Cardiovascular Disease

## 2018-07-11 DIAGNOSIS — I10 Essential (primary) hypertension: Secondary | ICD-10-CM

## 2018-07-11 MED ORDER — LABETALOL HCL 300 MG PO TABS
600.0000 mg | ORAL_TABLET | Freq: Three times a day (TID) | ORAL | 1 refills | Status: DC
Start: 2018-07-11 — End: 2019-02-20

## 2018-07-11 NOTE — Telephone Encounter (Signed)
New Message        *STAT* If patient is at the pharmacy, call can be transferred to refill team.   1. Which medications need to be refilled? (please list name of each medication and dose if known) labetalol (NORMODYNE) 300 MG tablet  2. Which pharmacy/location (including street and city if local pharmacy) is medication to be sent to? [facility] [address]  3. Do they need a 30 day or 90 day supply? [place]

## 2018-07-11 NOTE — Telephone Encounter (Signed)
Rx sent to pharmacy-advised to keep upcoming appt for further refills.

## 2018-07-13 ENCOUNTER — Other Ambulatory Visit: Payer: Self-pay

## 2018-07-13 DIAGNOSIS — I10 Essential (primary) hypertension: Secondary | ICD-10-CM

## 2018-07-13 DIAGNOSIS — E782 Mixed hyperlipidemia: Secondary | ICD-10-CM

## 2018-07-25 ENCOUNTER — Other Ambulatory Visit: Payer: Self-pay

## 2018-07-25 ENCOUNTER — Emergency Department (HOSPITAL_COMMUNITY): Payer: Medicare HMO

## 2018-07-25 ENCOUNTER — Encounter (HOSPITAL_COMMUNITY): Payer: Self-pay | Admitting: Emergency Medicine

## 2018-07-25 ENCOUNTER — Telehealth: Payer: Self-pay | Admitting: *Deleted

## 2018-07-25 ENCOUNTER — Emergency Department (HOSPITAL_COMMUNITY)
Admission: EM | Admit: 2018-07-25 | Discharge: 2018-07-26 | Disposition: A | Discharge status: Home / Self Care | Payer: Medicare HMO | Attending: Emergency Medicine | Admitting: Emergency Medicine

## 2018-07-25 DIAGNOSIS — Z7982 Long term (current) use of aspirin: Secondary | ICD-10-CM | POA: Insufficient documentation

## 2018-07-25 DIAGNOSIS — I129 Hypertensive chronic kidney disease with stage 1 through stage 4 chronic kidney disease, or unspecified chronic kidney disease: Secondary | ICD-10-CM | POA: Insufficient documentation

## 2018-07-25 DIAGNOSIS — R079 Chest pain, unspecified: Secondary | ICD-10-CM | POA: Diagnosis not present

## 2018-07-25 DIAGNOSIS — Z79899 Other long term (current) drug therapy: Secondary | ICD-10-CM | POA: Insufficient documentation

## 2018-07-25 DIAGNOSIS — N189 Chronic kidney disease, unspecified: Secondary | ICD-10-CM | POA: Insufficient documentation

## 2018-07-25 DIAGNOSIS — Z87891 Personal history of nicotine dependence: Secondary | ICD-10-CM | POA: Diagnosis not present

## 2018-07-25 DIAGNOSIS — J449 Chronic obstructive pulmonary disease, unspecified: Secondary | ICD-10-CM | POA: Diagnosis not present

## 2018-07-25 LAB — CBC
HCT: 38 % (ref 36.0–46.0)
Hemoglobin: 12.6 g/dL (ref 12.0–15.0)
MCH: 31.4 pg (ref 26.0–34.0)
MCHC: 33.2 g/dL (ref 30.0–36.0)
MCV: 94.8 fL (ref 78.0–100.0)
PLATELETS: 308 10*3/uL (ref 150–400)
RBC: 4.01 MIL/uL (ref 3.87–5.11)
RDW: 12.9 % (ref 11.5–15.5)
WBC: 4.7 10*3/uL (ref 4.0–10.5)

## 2018-07-25 LAB — I-STAT TROPONIN, ED: Troponin i, poc: 0.02 ng/mL (ref 0.00–0.08)

## 2018-07-25 LAB — BASIC METABOLIC PANEL
Anion gap: 11 (ref 5–15)
BUN: 17 mg/dL (ref 8–23)
CHLORIDE: 106 mmol/L (ref 98–111)
CO2: 23 mmol/L (ref 22–32)
CREATININE: 1.55 mg/dL — AB (ref 0.44–1.00)
Calcium: 9.2 mg/dL (ref 8.9–10.3)
GFR calc Af Amer: 40 mL/min — ABNORMAL LOW (ref >60)
GFR calc non Af Amer: 35 mL/min — ABNORMAL LOW (ref >60)
GLUCOSE: 104 mg/dL — AB (ref 70–99)
Potassium: 3.4 mmol/L — ABNORMAL LOW (ref 3.5–5.1)
Sodium: 140 mmol/L (ref 135–145)

## 2018-07-25 NOTE — Telephone Encounter (Signed)
Patient called and stated that she had to leave work early at 11:00 today because she started having Chest pains with pressure. Stated that it comes and goes every 10-20 minutes. Some SOB.  Not feeling good.   Advised patient to call 911 and go to the ER to be evaluated. Patient agreed.

## 2018-07-25 NOTE — ED Triage Notes (Signed)
Pt presents with CP since yesterday, denies any other associated symptoms; pt was not doing any strenuous activity, went away later than night and she went to work today and the pain came back

## 2018-07-26 MED ORDER — SUCRALFATE 1 G PO TABS: 1.0000 g | ORAL_TABLET | Freq: Three times a day (TID) | ORAL | 0 refills | Status: DC

## 2018-07-26 NOTE — ED Notes (Signed)
Patient verbalizes understanding of discharge instructions. Opportunity for questioning and answers were provided. Armband removed by staff, pt discharged from ED ambulatory.   

## 2018-07-26 NOTE — ED Provider Notes (Signed)
[facility] Provider Note   CSN: [id number] Arrival date & time: 07/25/18  1615     History   Chief Complaint Chief Complaint  Patient presents with  . Chest Pain    HPI Michele Page is a 62 y.o. female.  Patient presents for evaluation of chest pain she describes as sharp, burning pain to the center of her lower chest. It does not radiate. It is not made better or worse by anything she can identify. It does not affect her breathing. No nausea, vomiting or diaphoresis. No cough or fever. She reports pain has been coming and going for the past several months and seems to be getting more frequent. She has tried OTC antacids without any change in symptoms. She is not having any pain currently. She has not discussed her symptoms with her primary care provider as of yet. No regular use of NSAIDs.   The history is provided by the patient. No language interpreter was used.    Past Medical History:  Diagnosis Date  . Arthritis    lt knee  . Breast cancer ([place])   . Breast cancer of upper-outer quadrant of left female breast ([place]) 04/02/2016  . Colon polyps   . COPD (chronic obstructive pulmonary disease) ([place])   . Hyperlipidemia   . Hypertension   . Multiple sclerosis ([place])   . Stroke ([place])    2013, mild cognitive deficits  . Vision abnormalities     Patient Active Problem List   Diagnosis Date Noted  . Neck pain on right side 06/01/2018  . Left shoulder pain 11/25/2017  . Benign paroxysmal positional vertigo 11/25/2017  . History of breast cancer 11/25/2017  . Insomnia 11/25/2017  . Breast cancer of upper-outer quadrant of left female breast ([place]) 04/02/2016  . Essential hypertension 01/10/2016  . 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 Gastroesophageal reflux disease without esophagitis 01/10/2016  . Hyperlipidemia 01/10/2016  . CKD (chronic kidney disease) 01/10/2016  . Multiple sclerosis ([place])  03/20/2015  . Accelerated hypertension 03/20/2015  . CVA (cerebral infarction) 03/20/2015  . Hemiplegia following CVA (cerebrovascular accident) ([place]) 03/20/2015  . Cognitive disorder 03/20/2015  . Nocturia 03/20/2015  . Snoring 03/20/2015  . Aphasia S/P CVA 03/20/2015  . 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 HYSTEROSCOPY W/D&C N/A 12/19/2015   Procedure: DILATATION AND CURETTAGE /HYSTEROSCOPY;  Surgeon: Linda Hedges, DO;  Location: [facility];  Service: Gynecology;  Laterality: N/A;  . RADIOACTIVE SEED GUIDED PARTIAL MASTECTOMY WITH AXILLARY SENTINEL LYMPH NODE BIOPSY Left 04/17/2016   Procedure: RADIOACTIVE SEED GUIDED PARTIAL MASTECTOMY WITH AXILLARY SENTINEL LYMPH NODE BIOPSY;  Surgeon: Erroll [place], MD;  Location: [facility];  Service: General;  Laterality: Left;  RADIOACTIVE SEED GUIDED PARTIAL MASTECTOMY WITH AXILLARY SENTINEL LYMPH NODE BIOPSY   . TUBAL LIGATION    . VAGINAL DELIVERY     x3     OB History    Gravida  3   Para  3   Term      Preterm      AB      Living  3     SAB      TAB      Ectopic      Multiple      Live Births               Home Medications  Prior to Admission medications   Medication Sig Start Date End Date Taking? Authorizing Provider  anastrozole (ARIMIDEX) 1 MG tablet Take 1 tablet (1 mg total) by mouth daily. 04/18/18   Nicholas Lose, MD  aspirin EC 81 MG tablet Take 1 tablet (81 mg total) by mouth daily. 03/01/15   Gildardo Cranker, DO  labetalol (NORMODYNE) 300 MG tablet Take 2 tablets (600 mg total) by mouth 3 (three) times daily. KEEP OV. 07/11/18   Lorretta Harp, MD  labetalol (NORMODYNE) 300 MG tablet Take 2 tablets (600 mg total) by mouth 3 (three) times daily. Please keep upcoming for further refills, thanks! 07/11/18   Lorretta Harp, MD  NIFEdipine (PROCARDIA-XL/ADALAT CC) 60 MG 24 hr tablet Take 1 tablet (60 mg total) by  mouth 2 (two) times daily. 04/19/18   Gildardo Cranker, DO  nitrofurantoin, macrocrystal-monohydrate, (MACROBID) 100 MG capsule Take 1 capsule (100 mg total) by mouth 2 (two) times daily. 06/27/18   Gildardo Cranker, DO  pravastatin (PRAVACHOL) 10 MG tablet Take 1 tablet (10 mg total) by mouth at bedtime. 02/14/18   Gildardo Cranker, DO  sucralfate (CARAFATE) 1 g tablet Take 1 tablet (1 g total) by mouth 4 (four) times daily -  with meals and at bedtime for 7 days. 07/26/18 08/02/18  Charlann Lange, PA-C    Family History Family History  Problem Relation Age of Onset  . Stroke Sister   . Hypertension Mother   . Stroke Mother   . Alcohol abuse Brother   . HIV/AIDS Brother     Social History Social History   Tobacco Use  . Smoking status: Former Smoker    Packs/day: 0.50    Years: 25.00    Pack years: 12.50    Types: Cigarettes    Last attempt to quit: 11/16/2004    Years since quitting: 13.6  . Smokeless tobacco: Never Used  Substance Use Topics  . Alcohol use: Yes    Alcohol/week: 0.0 standard drinks    Comment: occas  . Drug use: No     Allergies   Patient has no known allergies.   Review of Systems Review of Systems  Constitutional: Negative for chills, diaphoresis and fever.  HENT: Negative.   Respiratory: Negative.  Negative for cough and shortness of breath.   Cardiovascular: Positive for chest pain.  Gastrointestinal: Negative.  Negative for abdominal pain, nausea and vomiting.  Musculoskeletal: Negative.   Skin: Negative.   Neurological: Negative.      Physical Exam Updated Vital Signs BP 128/75   Pulse 72   Temp 98.5 F (36.9 C) (Oral)   Resp 14   Ht 5\' 3"  (1.6 m)   Wt 86.2 kg   SpO2 98%   BMI 33.66 kg/m   Physical Exam  Constitutional: She is oriented to person, place, and time. She appears well-developed and well-nourished.  HENT:  Head: Normocephalic.  Neck: Normal range of motion. Neck supple.  Cardiovascular: Normal rate and regular rhythm.  No  murmur heard. Pulmonary/Chest: Effort normal and breath sounds normal. She has no wheezes. She has no rhonchi. She has no rales. She exhibits no tenderness.  Abdominal: Soft. Bowel sounds are normal. There is no tenderness. There is no rebound and no guarding.  Musculoskeletal: Normal range of motion. She exhibits no edema.  Neurological: She is alert and oriented to person, place, and time.  Skin: Skin is warm and dry. No rash noted.  Psychiatric: She has a normal mood and affect.  ED Treatments / Results  Labs (all labs ordered are listed, but only abnormal results are displayed) Labs Reviewed  BASIC METABOLIC PANEL - Abnormal; Notable for the following components:      Result Value   Potassium 3.4 (*)    Glucose, Bld 104 (*)    Creatinine, Ser 1.55 (*)    GFR calc non Af Amer 35 (*)    GFR calc Af Amer 40 (*)    All other components within normal limits  CBC  I-STAT TROPONIN, ED   Results for orders placed or performed during the hospital encounter of 19/50/93  Basic metabolic panel  Result Value Ref Range   Sodium 140 135 - 145 mmol/L   Potassium 3.4 (L) 3.5 - 5.1 mmol/L   Chloride 106 98 - 111 mmol/L   CO2 23 22 - 32 mmol/L   Glucose, Bld 104 (H) 70 - 99 mg/dL   BUN 17 8 - 23 mg/dL   Creatinine, Ser 1.55 (H) 0.44 - 1.00 mg/dL   Calcium 9.2 8.9 - 10.3 mg/dL   GFR calc non Af Amer 35 (L) >60 mL/min   GFR calc Af Amer 40 (L) >60 mL/min   Anion gap 11 5 - 15  CBC  Result Value Ref Range   WBC 4.7 4.0 - 10.5 K/uL   RBC 4.01 3.87 - 5.11 MIL/uL   Hemoglobin 12.6 12.0 - 15.0 g/dL   HCT 38.0 36.0 - 46.0 %   MCV 94.8 78.0 - 100.0 fL   MCH 31.4 26.0 - 34.0 pg   MCHC 33.2 30.0 - 36.0 g/dL   RDW 12.9 11.5 - 15.5 %   Platelets 308 150 - 400 K/uL  I-stat troponin, ED  Result Value Ref Range   Troponin i, poc 0.02 0.00 - 0.08 ng/mL   Comment 3            EKG None  Radiology Dg Chest 2 View  Result Date: 07/25/2018 CLINICAL DATA:  Left-sided chest pain and  tightness for the past 2 days. History of COPD, hypertension, MS, breast malignancy. EXAM: CHEST - 2 VIEW COMPARISON:  PA and lateral chest x-ray of August 19, 2014 FINDINGS: The lungs are well-expanded. There is no focal infiltrate. There is no pleural effusion. The heart and pulmonary vascularity are normal. There is calcification in the wall of the aortic arch. The bony thorax exhibits no acute abnormality. There are surgical clips in the left breast. IMPRESSION: There is no active cardiopulmonary disease. Electronically Signed   By: David  Martinique M.D.   On: 07/25/2018 16:54    Procedures Procedures (including critical care time)  Medications Ordered in ED Medications - No data to display   Initial Impression / Assessment and Plan / ED Course  I have reviewed the triage vital signs and the nursing notes.  Pertinent labs & imaging results that were available during my care of the patient were reviewed by me and considered in my medical decision making (see chart for details).     Patient here for evaluation of chest pain that has been intermittent for the past several months. No pain now. No modifying factors.   Labs, CXR and EKG done here to evaluate chest pain are unremarkable. No evidence of ischemic disease with her symptoms that are atypical of cardiac chest pain.   She can be discharged home. Will provide Rx carafate for 7 days and encouraged her to see her doctor this week for recheck to see if this helps her  symptoms.   Final Clinical Impressions(s) / ED Diagnoses   Final diagnoses:  Nonspecific chest pain    ED Discharge Orders         Ordered    sucralfate (CARAFATE) 1 g tablet  3 times daily with meals & bedtime     07/26/18 0224           Charlann Lange, PA-C 39/43/20 0379    Delora Fuel, MD 44/46/19 [phone]

## 2018-07-26 NOTE — Discharge Instructions (Addendum)
Take Carafate as directed and follow up with your doctor for recheck this week to have your symptoms of chest pain re-evaluated.

## 2018-07-26 NOTE — ED Notes (Signed)
ED Provider at bedside. 

## 2018-07-27 ENCOUNTER — Telehealth: Payer: Self-pay

## 2018-07-27 NOTE — Telephone Encounter (Signed)
Transition Care Management Follow-up Telephone Call  Date of discharge and from where: [facility] on 07/26/2018  How have you been since you were released from the hospital? Feeling good but still having the chest pain  Any questions or concerns? Yes   Items Reviewed:  Did the pt receive and understand the discharge instructions provided? Yes   Medications obtained and verified? No , picking up carafate today at the pharmacy  Any new allergies since your discharge? No   Dietary orders reviewed? Yes  Do you have support at home? Yes , lives with husband  Other (ie: DME, Home Health, etc) N/A  Functional Questionnaire: (I = Independent and D = Dependent) ADL's: I  Bathing/Dressing- I   Meal Prep- I  Eating- I  Maintaining continence- I  Transferring/Ambulation- I  Managing Meds- I   Follow up appointments reviewed:    PCP Hospital f/u appt confirmed? Yes  Scheduled to see Dr. Eulas Post on 07/29/2018 @ 8:45am.  [facility] f/u appt confirmed? Yes  Scheduled to see Cardioloist, Dr. Gwenlyn Found for check up on 08/16/2018 @ 2:45pm  Are transportation arrangements needed? Yes   If their condition worsens, is the pt aware to call  their PCP or go to the ED? Yes  Was the patient provided with contact information for the PCP's office or ED? Yes  Was the pt encouraged to call back with questions or concerns? Yes

## 2018-07-27 NOTE — Telephone Encounter (Signed)
I have made the 1st attempt to contact the patient or family member in charge, in order to follow up from recently being discharged from the hospital. I left a message on voicemail but I will make another attempt at a different time.  

## 2018-07-29 ENCOUNTER — Telehealth: Payer: Self-pay

## 2018-07-29 ENCOUNTER — Encounter: Payer: Self-pay | Admitting: Internal Medicine

## 2018-07-29 ENCOUNTER — Ambulatory Visit (INDEPENDENT_AMBULATORY_CARE_PROVIDER_SITE_OTHER): Payer: Medicare HMO | Admitting: Internal Medicine

## 2018-07-29 VITALS — BP 128/70 | HR 80 | Temp 98.5°F | Ht 63.0 in | Wt 196.0 lb

## 2018-07-29 DIAGNOSIS — K219 Gastro-esophageal reflux disease without esophagitis: Secondary | ICD-10-CM | POA: Diagnosis not present

## 2018-07-29 DIAGNOSIS — R0789 Other chest pain: Secondary | ICD-10-CM | POA: Diagnosis not present

## 2018-07-29 MED ORDER — ESOMEPRAZOLE MAGNESIUM 40 MG PO PACK: 40.0000 mg | PACK | Freq: Every day | ORAL | 12 refills | Status: DC

## 2018-07-29 NOTE — Telephone Encounter (Signed)
A fax was received from [facility] stating that Nexium 40 mg packets has been denied by patient's insurance. Approved medications include omeprazole, pantoprazole, and dexilant. A PA was initiated for the Nexium 40 mg packets via cover my meds.   Awaiting determination.

## 2018-07-29 NOTE — Progress Notes (Signed)
Patient ID: Michele Page, female   DOB: 1956-01-01, 62 y.o.   MRN: [id number]   Location:  [facility] OFFICE  Provider: DR Arletha Grippe  Code Status: FULL CODE Goals of Care:  Advanced Directives 01/25/2018  Does Patient Have a Medical Advance Directive? No  Would patient like information on creating a medical advance directive? -     Chief Complaint  Patient presents with  . Transitions Of Care    07/25/2018 - 07/26/2018 (10 hours)    HPI: Patient is a 62 y.o. female seen today for ED f/u. She was seen in the ED on 07/25/18 for atypical CP. Cardiac w/u neg. She was rx carafate which is helping. Pain in upper abdomen is dull and intermittent. No increased belching or gas. No N/v, change in bowel/bladder habits. Last EGD by Dr Hilarie Fredrickson in 2016 revealed reactive gastropathy. She was taking nexium at the time. Denies use of OTC NSAID tx. She is on arimidex for breast cancer    Past Medical History:  Diagnosis Date  . Arthritis    lt knee  . Breast cancer ([place])   . Breast cancer of upper-outer quadrant of left female breast ([place]) 04/02/2016  . Colon polyps   . COPD (chronic obstructive pulmonary disease) ([place])   . Hyperlipidemia   . Hypertension   . Multiple sclerosis ([place])   . Stroke ([place])    2013, mild cognitive deficits  . Vision abnormalities     Past Surgical History:  Procedure Laterality Date  . HYSTEROSCOPY W/D&C N/A 12/19/2015   Procedure: DILATATION AND CURETTAGE /HYSTEROSCOPY;  Surgeon: Linda Hedges, DO;  Location: [facility];  Service: Gynecology;  Laterality: N/A;  . RADIOACTIVE SEED GUIDED PARTIAL MASTECTOMY WITH AXILLARY SENTINEL LYMPH NODE BIOPSY Left 04/17/2016   Procedure: RADIOACTIVE SEED GUIDED PARTIAL MASTECTOMY WITH AXILLARY SENTINEL LYMPH NODE BIOPSY;  Surgeon: Erroll [place], MD;  Location: [facility];  Service: General;  Laterality: Left;  RADIOACTIVE SEED GUIDED PARTIAL MASTECTOMY WITH AXILLARY SENTINEL LYMPH NODE BIOPSY   . TUBAL LIGATION    . VAGINAL  DELIVERY     x3     reports that she quit smoking about 13 years ago. Her smoking use included cigarettes. She has a 12.50 pack-year smoking history. She has never used smokeless tobacco. She reports that she drinks alcohol. She reports that she does not use drugs. Social History   Socioeconomic History  . Marital status: Married    Spouse name: Not on file  . Number of children: 3  . Years of education: Not on file  . Highest education level: Not on file  Occupational History  . Occupation: retired  Scientific laboratory technician  . Financial resource strain: Not hard at all  . Food insecurity:    Worry: Never true    Inability: Never true  . Transportation needs:    Medical: No    Non-medical: No  Tobacco Use  . Smoking status: Former Smoker    Packs/day: 0.50    Years: 25.00    Pack years: 12.50    Types: Cigarettes    Last attempt to quit: 11/16/2004    Years since quitting: 13.7  . Smokeless tobacco: Never Used  Substance and Sexual Activity  . Alcohol use: Yes    Alcohol/week: 0.0 standard drinks    Comment: occas  . Drug use: No  . Sexual activity: Yes    Comment: 1st intercourse 7 yo-5 partners  Lifestyle  . Physical activity:    Days per week:  1 day    Minutes per session: 90 min  . Stress: Only a little  Relationships  . Social connections:    Talks on phone: Twice a week    Gets together: Once a week    Attends religious service: More than 4 times per year    Active member of club or organization: No    Attends meetings of clubs or organizations: Never    Relationship status: Married  . Intimate partner violence:    Fear of current or ex partner: No    Emotionally abused: No    Physically abused: No    Forced sexual activity: No  Other Topics Concern  . Not on file  Social History Narrative   Diet- N/A   Caffeine- Yes   Married- Yes   House- 2 story with 2 people   Pets- No   Current/past profession- Nurse, mental health   Exercise- Yes   Living will-No    DNR-N/A   POA/HPOA-No       Family History  Problem Relation Age of Onset  . Stroke Sister   . Hypertension Mother   . Stroke Mother   . Alcohol abuse Brother   . HIV/AIDS Brother     No Known Allergies  Outpatient Encounter Medications as of 07/29/2018  Medication Sig  . anastrozole (ARIMIDEX) 1 MG tablet Take 1 tablet (1 mg total) by mouth daily.  Marland Kitchen aspirin EC 81 MG tablet Take 1 tablet (81 mg total) by mouth daily.  Marland Kitchen labetalol (NORMODYNE) 300 MG tablet Take 2 tablets (600 mg total) by mouth 3 (three) times daily. Please keep upcoming for further refills, thanks!  Marland Kitchen NIFEdipine (PROCARDIA-XL/ADALAT CC) 60 MG 24 hr tablet Take 1 tablet (60 mg total) by mouth 2 (two) times daily.  . pravastatin (PRAVACHOL) 10 MG tablet Take 1 tablet (10 mg total) by mouth at bedtime.  . sucralfate (CARAFATE) 1 g tablet Take 1 tablet (1 g total) by mouth 4 (four) times daily -  with meals and at bedtime for 7 days.  . [DISCONTINUED] labetalol (NORMODYNE) 300 MG tablet Take 2 tablets (600 mg total) by mouth 3 (three) times daily. KEEP OV.  . [DISCONTINUED] nitrofurantoin, macrocrystal-monohydrate, (MACROBID) 100 MG capsule Take 1 capsule (100 mg total) by mouth 2 (two) times daily.   No facility-administered encounter medications on file as of 07/29/2018.     Review of Systems:  Review of Systems  Cardiovascular: Positive for chest pain.  Gastrointestinal: Positive for abdominal pain.  All other systems reviewed and are negative.   Health Maintenance  Topic Date Due  . MAMMOGRAM  03/31/2018  . INFLUENZA VACCINE  06/16/2018  . COLONOSCOPY  07/07/2018  . HIV Screening  11/16/2018 (Originally 08/03/1971)  . Hepatitis C Screening  11/24/2018 (Originally [birth year])  . TETANUS/TDAP  11/17/2019 (Originally 08/03/1974)  . PAP SMEAR  05/02/2021    Physical Exam: Vitals:   07/29/18 0850  BP: 128/70  Pulse: 80  Temp: 98.5 F (36.9 C)  TempSrc: Oral  SpO2: 95%  Weight: 196 lb (88.9 kg)    Height: 5\' 3"  (1.6 m)   Body mass index is 34.72 kg/m. Physical Exam  Constitutional: She is oriented to person, place, and time. She appears well-developed and well-nourished.  HENT:  Mouth/Throat: Oropharynx is clear and moist. No oropharyngeal exudate.  MMM; no oral thrush  Eyes: Pupils are equal, round, and reactive to light. No scleral icterus.  Neck: Neck supple. Carotid bruit is not present.  Cardiovascular: Normal  rate, regular rhythm and intact distal pulses. Exam reveals no gallop and no friction rub.  Murmur (1/6 SEM) heard. No LE edema b/l. no calf TTP.   Pulmonary/Chest: Effort normal and breath sounds normal. No stridor. No respiratory distress. She has no wheezes. She has no rales. She exhibits no tenderness (CP not reproducible).  Abdominal: Soft. Normal appearance and bowel sounds are normal. She exhibits distension. She exhibits no mass. There is no hepatomegaly. There is tenderness (epigastric). There is no rigidity, no rebound and no guarding. No hernia.  obese  Lymphadenopathy:    She has no cervical adenopathy.  Neurological: She is alert and oriented to person, place, and time. She has normal reflexes.  Skin: Skin is warm and dry. No rash noted.  Psychiatric: She has a normal mood and affect. Her behavior is normal. Judgment and thought content normal.    Labs reviewed: Basic Metabolic Panel: Recent Labs    11/24/17 0951 06/24/18 1045 07/25/18 1631  NA 140 143 140  K 4.3 3.9 3.4*  CL 103 107 106  CO2 27 24 23   GLUCOSE 98 106* 104*  BUN 12 17 17   CREATININE 1.15* 1.53* 1.55*  CALCIUM 9.9 9.5 9.2  TSH  --  1.70  --    Liver Function Tests: Recent Labs    11/24/17 0951 06/24/18 1045  AST 21 19  ALT 19 17  BILITOT 0.5 0.5  PROT 7.0 6.8   No results for input(s): LIPASE, AMYLASE in the last 8760 hours. No results for input(s): AMMONIA in the last 8760 hours. CBC: Recent Labs    11/24/17 0951 06/24/18 1045 07/25/18 1631  WBC 4.4 4.4 4.7   NEUTROABS 2,702 2,882  --   HGB 12.3 11.7 12.6  HCT 36.1 34.7* 38.0  MCV 90.5 92.8 94.8  PLT 321 293 308   Lipid Panel: Recent Labs    11/24/17 0951 06/24/18 1045  CHOL 178 205*  HDL 77 65  LDLCALC 82 121*  TRIG 98 88  CHOLHDL 2.3 3.2   No results found for: HGBA1C  Procedures since last visit: Dg Chest 2 View  Result Date: 07/25/2018 CLINICAL DATA:  Left-sided chest pain and tightness for the past 2 days. History of COPD, hypertension, MS, breast malignancy. EXAM: CHEST - 2 VIEW COMPARISON:  PA and lateral chest x-ray of August 19, 2014 FINDINGS: The lungs are well-expanded. There is no focal infiltrate. There is no pleural effusion. The heart and pulmonary vascularity are normal. There is calcification in the wall of the aortic arch. The bony thorax exhibits no acute abnormality. There are surgical clips in the left breast. IMPRESSION: There is no active cardiopulmonary disease. Electronically Signed   By: David  Martinique M.D.   On: 07/25/2018 16:54    Assessment/Plan   ICD-10-CM   1. Gastroesophageal reflux disease without esophagitis - with hx reactive gastropathy K21.9 esomeprazole (NEXIUM) 40 MG packet  2. Atypical chest pain - improving R07.89    START NEXIUM 40MG  DAILY 30 MINUTES BEFORE BREAKFAST FOR ACID REFLUX  Continue other medications as ordered  AVOID NSAIDS (IBUPROFEN, ALEVE, ADVIL)  May need GI Dr Hilarie Fredrickson if no better in the next 4 weeks  Follow up as scheduled or sooner if need be.       Lynnex Fulp S. Perlie Gold  [facility] [address] [place], [place] [address] [phone] Cell (Monday-Friday 8 AM - 5 PM) [phone] After 5 PM and follow prompts

## 2018-07-29 NOTE — Patient Instructions (Signed)
START NEXIUM 40MG  DAILY 30 MINUTES BEFORE BREAKFAST FOR ACID REFLUX  Continue other medications as ordered  AVOID NSAIDS (IBUPROFEN, ALEVE, ADVIL)  May need GI Dr Hilarie Fredrickson if no better in the next 4 weeks  Follow up as scheduled or sooner if need be.

## 2018-08-03 MED ORDER — ESOMEPRAZOLE MAGNESIUM 40 MG PO CPDR: 40.0000 mg | DELAYED_RELEASE_CAPSULE | Freq: Every day | ORAL | 3 refills | Status: DC

## 2018-08-03 NOTE — Telephone Encounter (Signed)
Fax received from [facility] [phone] and Nexium 40 mg packet DENIED. Preferred drugs, include Pantoprazole, Dexilant. Please Advise.   Member ID: [id number]

## 2018-08-03 NOTE — Telephone Encounter (Signed)
Patient notified and agreed. Medication list updated and rx faxed to pharmacy.

## 2018-08-03 NOTE — Telephone Encounter (Signed)
Please order nexium 40mg  caps po daily #30 with 3 RF if they cover nexium. If not, dexilant 30mg  #30 take 1 cap po daily with 3 RF

## 2018-08-16 ENCOUNTER — Encounter: Payer: Self-pay | Admitting: Cardiovascular Disease

## 2018-08-16 ENCOUNTER — Ambulatory Visit: Payer: Medicare HMO | Admitting: Cardiovascular Disease

## 2018-08-16 ENCOUNTER — Ambulatory Visit (INDEPENDENT_AMBULATORY_CARE_PROVIDER_SITE_OTHER): Payer: Medicare HMO | Admitting: Cardiovascular Disease

## 2018-08-16 DIAGNOSIS — I10 Essential (primary) hypertension: Secondary | ICD-10-CM | POA: Diagnosis not present

## 2018-08-16 DIAGNOSIS — E7800 Pure hypercholesterolemia, unspecified: Secondary | ICD-10-CM

## 2018-08-16 MED ORDER — PRAVASTATIN SODIUM 20 MG PO TABS: 20.0000 mg | ORAL_TABLET | Freq: Every day | ORAL | 3 refills | Status: DC

## 2018-08-16 NOTE — Progress Notes (Signed)
08/16/2018 [place]   [birth year]  [id number]  Primary Physician Gildardo Cranker, DO Primary Cardiologist: Lorretta Harp MD Lupe [place], [place]  HPI:  Michele Page is a 62 y.o.  moderately overweight married African-American female mother of 3 children, grandmother of 39 grandchildren who relocated from [place] to [place] 4 years ago. She is retired from working in her own daycare. I last saw her in the office 03/30/2017 She is a history of hypertension and hypokalemia. She's had a stroke 4 years ago leaving her with some motor and cognitive deficit deficits. She's never had a heart attack. She denies chest pain or shortness of breath. She was referred here for further management of her hypertension. Since I saw her a year ago her blood pressure has remained well controlled on her current medical regimen.  Since I saw her in the office a year and a half ago she is remained stable.  Blood pressures been under good control on labetalol and Procardia.  She denies chest pain or shortness of breath. No outpatient medications have been marked as taking for the 08/16/18 encounter (Office Visit) with Lorretta Harp, MD.     No Known Allergies  Social History   Socioeconomic History  . Marital status: Married    Spouse name: Not on file  . Number of children: 3  . Years of education: Not on file  . Highest education level: Not on file  Occupational History  . Occupation: retired  Scientific laboratory technician  . Financial resource strain: Not hard at all  . Food insecurity:    Worry: Never true    Inability: Never true  . Transportation needs:    Medical: No    Non-medical: No  Tobacco Use  . Smoking status: Former Smoker    Packs/day: 0.50    Years: 25.00    Pack years: 12.50    Types: Cigarettes    Last attempt to quit: 11/16/2004    Years since quitting: 13.7  . Smokeless tobacco: Never Used  Substance and Sexual Activity  . Alcohol use: Yes    Alcohol/week: 0.0 standard  drinks    Comment: occas  . Drug use: No  . Sexual activity: Yes    Comment: 1st intercourse 53 yo-5 partners  Lifestyle  . Physical activity:    Days per week: 1 day    Minutes per session: 90 min  . Stress: Only a little  Relationships  . Social connections:    Talks on phone: Twice a week    Gets together: Once a week    Attends religious service: More than 4 times per year    Active member of club or organization: No    Attends meetings of clubs or organizations: Never    Relationship status: Married  . Intimate partner violence:    Fear of current or ex partner: No    Emotionally abused: No    Physically abused: No    Forced sexual activity: No  Other Topics Concern  . Not on file  Social History Narrative   Diet- N/A   Caffeine- Yes   Married- Yes   House- 2 story with 2 people   Pets- No   Current/past profession- Nurse, mental health   Exercise- Yes   Living will-No   DNR-N/A   POA/HPOA-No        Review of Systems: General: negative for chills, fever, night sweats or weight changes.  Cardiovascular: negative for chest pain,  dyspnea on exertion, edema, orthopnea, palpitations, paroxysmal nocturnal dyspnea or shortness of breath Dermatological: negative for rash Respiratory: negative for cough or wheezing Urologic: negative for hematuria Abdominal: negative for nausea, vomiting, diarrhea, bright red blood per rectum, melena, or hematemesis Neurologic: negative for visual changes, syncope, or dizziness All other systems reviewed and are otherwise negative except as noted above.    Blood pressure 130/70, pulse 84, height 5\' 2"  (1.575 m), weight 195 lb 9.6 oz (88.7 kg).  General appearance: alert and no distress Neck: no adenopathy, no carotid bruit, no JVD, supple, symmetrical, trachea midline and thyroid not enlarged, symmetric, no tenderness/mass/nodules Lungs: clear to auscultation bilaterally Heart: regular rate and rhythm, S1, S2 normal, no murmur,  click, rub or gallop Extremities: extremities normal, atraumatic, no cyanosis or edema Pulses: 2+ and symmetric Skin: Skin color, texture, turgor normal. No rashes or lesions Neurologic: Alert and oriented X 3, normal strength and tone. Normal symmetric reflexes. Normal coordination and gait  EKG not performed today  ASSESSMENT AND PLAN:   Accelerated hypertension Of essential hypertension her blood pressure measured at 130/70 on labetalol and nifedipine.  Continue current meds at current dosing.  Hyperlipidemia History of hyperlipidemia on Pravachol 10 mg a day with an LDL of 120.  Increase Pravachol 20 mg a day and we will recheck a lipid liver profile in 3 months.      Lorretta Harp MD FACP,FACC,FAHA, [facility] 08/16/2018 1:47 PM

## 2018-08-16 NOTE — Assessment & Plan Note (Signed)
History of hyperlipidemia on Pravachol 10 mg a day with an LDL of 120.  Increase Pravachol 20 mg a day and we will recheck a lipid liver profile in 3 months.

## 2018-08-16 NOTE — Addendum Note (Signed)
Addended by: Newt Minion on: 08/16/2018 01:54 PM   Modules accepted: Orders

## 2018-08-16 NOTE — Assessment & Plan Note (Signed)
Of essential hypertension her blood pressure measured at 130/70 on labetalol and nifedipine.  Continue current meds at current dosing.

## 2018-08-16 NOTE — Patient Instructions (Signed)
Medication Instructions:  Your physician has recommended you make the following change in your medication:  1) INCREASE Pravastatin to 20 mg tablet by mouth ONCE daily   Labwork: Your physician recommends that you return for lab work in: FASTING Lipid/Liver - 3 months   Testing/Procedures: none  Follow-Up: Your physician wants you to follow-up in: 12 months with Dr. Gwenlyn Found. You will receive a reminder letter in the mail two months in advance. If you don't receive a letter, please call our office to schedule the follow-up appointment.   Any Other Special Instructions Will Be Listed Below (If Applicable).     If you need a refill on your cardiac medications before your next appointment, please call your pharmacy.

## 2018-09-05 ENCOUNTER — Telehealth: Payer: Self-pay

## 2018-09-05 NOTE — Telephone Encounter (Signed)
Spoke with patient, patient states she would like to resume Ambien that she took in 2018, 5 mg 1 by mouth qhs  Dr.Carter please advise

## 2018-09-05 NOTE — Telephone Encounter (Signed)
Patient called requesting prescribed sleep aid. Patient with trouble falling and staying asleep x 2 weeks. Patient has tried several OTC medications with no relief   Pharmacy on file confirmed   Please advise

## 2018-09-05 NOTE — Telephone Encounter (Signed)
Would she like to resume lunesta? If so, ok for lunesta 2mg  #30 take 1 tab po qhs with no RF

## 2018-09-06 MED ORDER — ESZOPICLONE 2 MG PO TABS: 2.0000 mg | ORAL_TABLET | Freq: Every day | ORAL | 0 refills | Status: DC

## 2018-09-06 NOTE — Telephone Encounter (Signed)
Patient notified and agreed. Medication list updated and phoned in Rx.

## 2018-09-06 NOTE — Telephone Encounter (Signed)
I believe the lunesta would be better for her. She did not do well with ambien (regarding compliance)

## 2018-09-08 ENCOUNTER — Telehealth: Payer: Self-pay | Admitting: *Deleted

## 2018-09-08 DIAGNOSIS — G4700 Insomnia, unspecified: Secondary | ICD-10-CM

## 2018-09-08 NOTE — Telephone Encounter (Signed)
Received fax from [facility] [place] was DENIED due to non formulary.  Belsomra is the formulary choice.  Please Advise.  ID: [id number]

## 2018-09-08 NOTE — Telephone Encounter (Signed)
Fax received from [facility] for Prior authorization for Lunesta. Initiated through [facility]. Went into determination. (Belsomra is the formulary choice) Key: [id number] PA Case: [id number]

## 2018-09-12 MED ORDER — SUVOREXANT 5 MG PO TABS
1.0000 | ORAL_TABLET | Freq: Every day | ORAL | 0 refills | Status: DC
Start: 2018-09-12 — End: 2018-12-01

## 2018-09-12 NOTE — Telephone Encounter (Signed)
Patient notified and agreed.  

## 2018-09-12 NOTE — Addendum Note (Signed)
Addended by: Gildardo Cranker on: 09/12/2018 12:15 AM   Modules accepted: Orders

## 2018-09-12 NOTE — Addendum Note (Signed)
Addended by: Rafael Bihari A on: 09/12/2018 08:45 AM   Modules accepted: Orders

## 2018-09-23 ENCOUNTER — Other Ambulatory Visit: Payer: Medicare HMO

## 2018-09-28 ENCOUNTER — Ambulatory Visit: Payer: Medicare HMO | Admitting: Nurse Practitioner

## 2018-10-03 ENCOUNTER — Telehealth: Payer: Self-pay | Admitting: Hematology and Oncology

## 2018-10-03 NOTE — Telephone Encounter (Signed)
[facility] PAL 12/9 moved f/u to 12/20. Left message for patient. Schedule mailed.

## 2018-10-17 ENCOUNTER — Ambulatory Visit: Payer: Medicare HMO | Admitting: Neurology

## 2018-10-18 ENCOUNTER — Other Ambulatory Visit: Payer: Medicare HMO

## 2018-10-19 ENCOUNTER — Telehealth: Payer: Self-pay | Admitting: *Deleted

## 2018-10-19 NOTE — Telephone Encounter (Signed)
Patient called and left message on clinical intake stating that she thinks she wants to cancel her appointment for Friday because things were "messed" up.   Tried calling patient back and LMOM to return call.

## 2018-10-20 NOTE — Telephone Encounter (Signed)
Patient canceled tomorrow's appointment and rescheduled for next week so she will have her fasting labs done before the appointment time.

## 2018-10-21 ENCOUNTER — Telehealth: Payer: Self-pay | Admitting: Hematology and Oncology

## 2018-10-21 ENCOUNTER — Ambulatory Visit: Payer: Medicare HMO | Admitting: Nurse Practitioner

## 2018-10-21 NOTE — Telephone Encounter (Signed)
Per 12/6 sch message - left message for patient about appt on 12/9 - no longer on that date - moved to 12/20

## 2018-10-24 ENCOUNTER — Other Ambulatory Visit: Payer: Medicare HMO

## 2018-10-24 ENCOUNTER — Ambulatory Visit: Payer: Medicare HMO | Admitting: Hematology and Oncology

## 2018-10-24 DIAGNOSIS — I10 Essential (primary) hypertension: Secondary | ICD-10-CM | POA: Diagnosis not present

## 2018-10-24 DIAGNOSIS — E782 Mixed hyperlipidemia: Secondary | ICD-10-CM | POA: Diagnosis not present

## 2018-10-25 LAB — BASIC METABOLIC PANEL WITH GFR
BUN/Creatinine Ratio: 14 (calc) (ref 6–22)
BUN: 17 mg/dL (ref 7–25)
CHLORIDE: 103 mmol/L (ref 98–110)
CO2: 29 mmol/L (ref 20–32)
Calcium: 9.7 mg/dL (ref 8.6–10.4)
Creat: 1.21 mg/dL — ABNORMAL HIGH (ref 0.50–0.99)
GFR, EST AFRICAN AMERICAN: 55 mL/min/{1.73_m2} — AB (ref >=60)
GFR, Est Non African American: 48 mL/min/{1.73_m2} — ABNORMAL LOW (ref >=60)
Glucose, Bld: 91 mg/dL (ref 65–99)
Potassium: 4.1 mmol/L (ref 3.5–5.3)
Sodium: 139 mmol/L (ref 135–146)

## 2018-10-25 LAB — LIPID PANEL
CHOL/HDL RATIO: 2.7 (calc) (ref <5.0)
Cholesterol: 176 mg/dL (ref <200)
HDL: 65 mg/dL (ref >50)
LDL Cholesterol (Calc): 94 mg/dL (calc)
Non-HDL Cholesterol (Calc): 111 mg/dL (calc) (ref <130)
Triglycerides: 84 mg/dL (ref <150)

## 2018-10-25 LAB — ALT: ALT: 29 U/L (ref 6–29)

## 2018-10-26 ENCOUNTER — Ambulatory Visit (INDEPENDENT_AMBULATORY_CARE_PROVIDER_SITE_OTHER): Payer: Medicare HMO | Admitting: Nurse Practitioner

## 2018-10-26 ENCOUNTER — Encounter: Payer: Self-pay | Admitting: Nurse Practitioner

## 2018-10-26 VITALS — BP 124/72 | HR 71 | Temp 98.6°F | Ht 62.0 in | Wt 204.6 lb

## 2018-10-26 DIAGNOSIS — I6930 Unspecified sequelae of cerebral infarction: Secondary | ICD-10-CM | POA: Diagnosis not present

## 2018-10-26 DIAGNOSIS — K219 Gastro-esophageal reflux disease without esophagitis: Secondary | ICD-10-CM

## 2018-10-26 DIAGNOSIS — Z23 Encounter for immunization: Secondary | ICD-10-CM | POA: Diagnosis not present

## 2018-10-26 DIAGNOSIS — N183 Chronic kidney disease, stage 3 unspecified: Secondary | ICD-10-CM

## 2018-10-26 DIAGNOSIS — G35 Multiple sclerosis: Secondary | ICD-10-CM

## 2018-10-26 DIAGNOSIS — E782 Mixed hyperlipidemia: Secondary | ICD-10-CM | POA: Diagnosis not present

## 2018-10-26 DIAGNOSIS — I10 Essential (primary) hypertension: Secondary | ICD-10-CM | POA: Diagnosis not present

## 2018-10-26 DIAGNOSIS — G4700 Insomnia, unspecified: Secondary | ICD-10-CM | POA: Diagnosis not present

## 2018-10-26 NOTE — Progress Notes (Signed)
Careteam: Patient Care Team: [name], NP as PCP - General (Geriatric Medicine) Nicholas Lose, MD as Consulting Physician (Oncology) Erroll Luna, MD as Consulting Physician (General Surgery) Thea Silversmith, MD as Consulting Physician (Radiation Oncology)  Advanced Directive information Does Patient Have a Medical Advance Directive?: No  No Known Allergies  Chief Complaint  Patient presents with  . Medical Management of Chronic Issues    Pt is being seen for a 3 month routine visit. Pt is having pain in left index finger and thumb.   . Form Completion    Pt needs handicapp placard application completed.      HPI: Patient is a 62 y.o. female seen in the office today for routine follow up.   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 She completed XRT in June 2018. She was told no chemotx needed. On femara from 04/2017- Dec 2018 and changed to tamoxifen due to hair loss concerns. Arimidex caused hair loss but she is now taking it again. Followed by Dr Lindi Adie.   Insomnia - stable. Taking belsomra as needed  HTN - stable on labetalol 300 mg 2 tablets 3 times daily and procardia 60 mg daily . Followed by cardio Dr Gwenlyn Found. She takes ASA daily  Hyperlipidemia - controlled on pravastatin. LDL 82. No myalgias  COPD - stable. no exacerbations. No issues with breathing  Hx CVA - stable. Takes statin and ASA daily. LDL 94. Followed by Dr Felecia Shelling. MRI brain in Apr 2018 revealed remote hemorrhagic infarct in left basal ganglia and thalamus. She has expressive aphasia.occasionally will have to sit, gets tired easily.   MS - no changes. asymptomatic. no diplopia but she cannot "stare" for prolonged time without blinking. Followed by Neurology Dr Felecia Shelling. No exacerbations. MRI brain in Apr 2018 revealed b/l nonspecific extensive periventricular and subcortical white matter diseasel.  Gastritis/GERD - stable' went To  ED due to symptoms. Denies acid reflux sx's.  CKD - stage 3. probably due to HTN. Cr 1.21  Labs reviewed with pt    Review of Systems:  Review of Systems  Constitutional: Negative for chills, fever and weight loss.  HENT: Negative for tinnitus.   Respiratory: Negative for cough, sputum production and shortness of breath.   Cardiovascular: Negative for chest pain, palpitations and leg swelling.  Gastrointestinal: Negative for abdominal pain, constipation, diarrhea and heartburn.  Genitourinary: Negative for dysuria, frequency and urgency.  Musculoskeletal: Negative for back pain, falls, joint pain and myalgias.  Skin: Negative.   Neurological: Negative for dizziness and headaches.  Psychiatric/Behavioral: Negative for depression and memory loss. The patient does not have insomnia.     Past Medical History:  Diagnosis Date  . Arthritis    lt knee  . Breast cancer ([place])   . Breast cancer of upper-outer quadrant of left female breast ([place]) 04/02/2016  . Colon polyps   . COPD (chronic obstructive pulmonary disease) ([place])   . Hyperlipidemia   . Hypertension   . Multiple sclerosis ([place])   . Stroke ([place])    2013, mild cognitive deficits  . Vision abnormalities    Past Surgical History:  Procedure Laterality Date  . HYSTEROSCOPY W/D&C N/A 12/19/2015   Procedure: DILATATION AND CURETTAGE /HYSTEROSCOPY;  Surgeon: Linda Hedges, DO;  Location: [facility];  Service: Gynecology;  Laterality: N/A;  . RADIOACTIVE SEED GUIDED PARTIAL MASTECTOMY WITH AXILLARY SENTINEL LYMPH NODE BIOPSY Left 04/17/2016   Procedure: RADIOACTIVE SEED GUIDED  PARTIAL MASTECTOMY WITH AXILLARY SENTINEL LYMPH NODE BIOPSY;  Surgeon: Erroll Luna, MD;  Location: [facility];  Service: General;  Laterality: Left;  RADIOACTIVE SEED GUIDED PARTIAL MASTECTOMY WITH AXILLARY SENTINEL LYMPH NODE BIOPSY   . TUBAL LIGATION    . VAGINAL DELIVERY     x3   Social History:   reports that she quit smoking about 13 years  ago. Her smoking use included cigarettes. She has a 12.50 pack-year smoking history. She has never used smokeless tobacco. She reports that she drinks alcohol. She reports that she does not use drugs.  Family History  Problem Relation Age of Onset  . Stroke Sister   . Hypertension Mother   . Stroke Mother   . Alcohol abuse Brother   . HIV/AIDS Brother     Medications: Patient's Medications  New Prescriptions   No medications on file  Previous Medications   ANASTROZOLE (ARIMIDEX) 1 MG TABLET    Take 1 tablet (1 mg total) by mouth daily.   ASPIRIN EC 81 MG TABLET    Take 1 tablet (81 mg total) by mouth daily.   LABETALOL (NORMODYNE) 300 MG TABLET    Take 2 tablets (600 mg total) by mouth 3 (three) times daily. Please keep upcoming for further refills, thanks!   NIFEDIPINE (PROCARDIA-XL/ADALAT CC) 60 MG 24 HR TABLET    Take 1 tablet (60 mg total) by mouth 2 (two) times daily.   PRAVASTATIN (PRAVACHOL) 20 MG TABLET    Take 1 tablet (20 mg total) by mouth at bedtime.   SUVOREXANT (BELSOMRA) 5 MG TABS    Take 1 tablet by mouth at bedtime. For insomnia  Modified Medications   No medications on file  Discontinued Medications   No medications on file     Physical Exam:  Vitals:   10/26/18 1346  BP: 124/72  Pulse: 71  Temp: 98.6 F (37 C)  TempSrc: Oral  SpO2: 97%  Weight: 204 lb 9.6 oz (92.8 kg)  Height: 5\' 2"  (1.575 m)   Body mass index is 37.42 kg/m.  Physical Exam  Constitutional: She is oriented to person, place, and time. She appears well-developed and well-nourished.  HENT:  Mouth/Throat: Oropharynx is clear and moist. No oropharyngeal exudate.  MMM; no oral thrush  Eyes: Pupils are equal, round, and reactive to light. No scleral icterus.  Neck: Neck supple. Carotid bruit is not present. No tracheal deviation present. No thyromegaly present.  Cardiovascular: Normal rate, regular rhythm and intact distal pulses. Exam reveals no gallop and no friction rub.  Murmur (1/6  SEM) heard. No LE edema b/l. no calf TTP.   Pulmonary/Chest: Effort normal and breath sounds normal. No stridor. No respiratory distress.  Abdominal: Soft. Normal appearance and bowel sounds are normal. There is no hepatomegaly. There is no rigidity.  obese  Lymphadenopathy:    She has no cervical adenopathy.  Neurological: She is alert and oriented to person, place, and time. She has normal reflexes.  Skin: Skin is warm and dry. No rash noted.  Psychiatric: She has a normal mood and affect. Her behavior is normal. Judgment and thought content normal. Her speech is slurred.  Expressive aphasia    Labs reviewed: Basic Metabolic Panel: Recent Labs    06/24/18 1045 07/25/18 1631 10/24/18 1015  NA 143 140 139  K 3.9 3.4* 4.1  CL 107 106 103  CO2 24 23 29   GLUCOSE 106* 104* 91  BUN 17 17 17   CREATININE 1.53* 1.55* 1.21*  CALCIUM 9.5 9.2 9.7  TSH 1.70  --   --    Liver Function Tests: Recent Labs    11/24/17 0951 06/24/18 1045 10/24/18 1015  AST 21 19  --   ALT 19 17 29   BILITOT 0.5 0.5  --   PROT 7.0 6.8  --    No results for input(s): LIPASE, AMYLASE in the last 8760 hours. No results for input(s): AMMONIA in the last 8760 hours. CBC: Recent Labs    11/24/17 0951 06/24/18 1045 07/25/18 1631  WBC 4.4 4.4 4.7  NEUTROABS 2,702 2,882  --   HGB 12.3 11.7 12.6  HCT 36.1 34.7* 38.0  MCV 90.5 92.8 94.8  PLT 321 293 308   Lipid Panel: Recent Labs    11/24/17 0951 06/24/18 1045 10/24/18 1015  CHOL 178 205* 176  HDL 77 65 65  LDLCALC 82 121* 94  TRIG 98 88 84  CHOLHDL 2.3 3.2 2.7   TSH: Recent Labs    06/24/18 1045  TSH 1.70   A1C: No results found for: HGBA1C   Assessment/Plan 1. Mixed hyperlipidemia Continues on pravastatin, LDL 94 improved from 121 but goal <70, to continue statin with dietary modifications.  - COMPLETE METABOLIC PANEL WITH GFR; Future - Lipid panel; Future  2. Insomnia, unspecified type -controlled, uses belsomra 5 mg  PRN.  3. Essential hypertension -stable, will continue current regimen, encouraged DASH diet.   4. Multiple sclerosis ([name]) Stable, continues to follow up with neurology, no recent exacerbations.   5. History of stroke with residual deficit Stable, cont on ASA and statin, goal LDL < 70. Pt plans to work on dietary modifications.   6. Gastroesophageal reflux disease without esophagitis Stable, not needing medications at this time.  7. Stage 3 chronic kidney disease ([name]) -stable. Encourage proper hydration and to avoid NSAIDS (Aleve, Advil, Motrin, Ibuprofen)   Next appt: 4 months with lab work before.  Carlos American. [place], [facility] [phone]

## 2018-10-26 NOTE — Patient Instructions (Addendum)
Make sure to follow up with gastroenterologist for colonoscopy  Schedule Mammogram   DASH Eating Plan DASH stands for "Dietary Approaches to Stop Hypertension." The DASH eating plan is a healthy eating plan that has been shown to reduce high blood pressure (hypertension). It may also reduce your risk for type 2 diabetes, heart disease, and stroke. The DASH eating plan may also help with weight loss. What are tips for following this plan? General guidelines  Avoid eating more than 2,300 mg (milligrams) of salt (sodium) a day. If you have hypertension, you may need to reduce your sodium intake to 1,500 mg a day.  Limit alcohol intake to no more than 1 drink a day for nonpregnant women and 2 drinks a day for men. One drink equals 12 oz of beer, 5 oz of wine, or 1 oz of hard liquor.  Work with your health care provider to maintain a healthy body weight or to lose weight. Ask what an ideal weight is for you.  Get at least 30 minutes of exercise that causes your heart to beat faster (aerobic exercise) most days of the week. Activities may include walking, swimming, or biking.  Work with your health care provider or diet and nutrition specialist (dietitian) to adjust your eating plan to your individual calorie needs. Reading food labels  Check food labels for the amount of sodium per serving. Choose foods with less than 5 percent of the Daily Value of sodium. Generally, foods with less than 300 mg of sodium per serving fit into this eating plan.  To find whole grains, look for the word "whole" as the first word in the ingredient list. Shopping  Buy products labeled as "low-sodium" or "no salt added."  Buy fresh foods. Avoid canned foods and premade or frozen meals. Cooking  Avoid adding salt when cooking. Use salt-free seasonings or herbs instead of table salt or sea salt. Check with your health care provider or pharmacist before using salt substitutes.  Do not fry foods. Cook foods using  healthy methods such as baking, boiling, grilling, and broiling instead.  Cook with heart-healthy oils, such as olive, canola, soybean, or sunflower oil. Meal planning   Eat a balanced diet that includes: ? 5 or more servings of fruits and vegetables each day. At each meal, try to fill half of your plate with fruits and vegetables. ? Up to 6-8 servings of whole grains each day. ? Less than 6 oz of lean meat, poultry, or fish each day. A 3-oz serving of meat is about the same size as a deck of cards. One egg equals 1 oz. ? 2 servings of low-fat dairy each day. ? A serving of nuts, seeds, or beans 5 times each week. ? Heart-healthy fats. Healthy fats called Omega-3 fatty acids are found in foods such as flaxseeds and coldwater fish, like sardines, salmon, and mackerel.  Limit how much you eat of the following: ? Canned or prepackaged foods. ? Food that is high in trans fat, such as fried foods. ? Food that is high in saturated fat, such as fatty meat. ? Sweets, desserts, sugary drinks, and other foods with added sugar. ? Full-fat dairy products.  Do not salt foods before eating.  Try to eat at least 2 vegetarian meals each week.  Eat more home-cooked food and less restaurant, buffet, and fast food.  When eating at a restaurant, ask that your food be prepared with less salt or no salt, if possible. What foods are recommended? The  items listed may not be a complete list. Talk with your dietitian about what dietary choices are best for you. Grains Whole-grain or whole-wheat bread. Whole-grain or whole-wheat pasta. Brown rice. [place]. Bulgur. Whole-grain and low-sodium cereals. Pita bread. Low-fat, low-sodium crackers. Whole-wheat flour tortillas. Vegetables Fresh or frozen vegetables (raw, steamed, roasted, or grilled). Low-sodium or reduced-sodium tomato and vegetable juice. Low-sodium or reduced-sodium tomato sauce and tomato paste. Low-sodium or reduced-sodium canned  vegetables. Fruits All fresh, dried, or frozen fruit. Canned fruit in natural juice (without added sugar). Meat and other protein foods Skinless chicken or [place]. Ground chicken or [place]. Pork with fat trimmed off. Fish and seafood. Egg whites. Dried beans, peas, or lentils. Unsalted nuts, nut butters, and seeds. Unsalted canned beans. Lean cuts of beef with fat trimmed off. Low-sodium, lean deli meat. Dairy Low-fat (1%) or fat-free (skim) milk. Fat-free, low-fat, or reduced-fat cheeses. Nonfat, low-sodium ricotta or cottage cheese. Low-fat or nonfat yogurt. Low-fat, low-sodium cheese. Fats and oils Soft margarine without trans fats. Vegetable oil. Low-fat, reduced-fat, or light mayonnaise and salad dressings (reduced-sodium). Canola, safflower, olive, soybean, and sunflower oils. Avocado. Seasoning and other foods Herbs. Spices. Seasoning mixes without salt. Unsalted popcorn and pretzels. Fat-free sweets. What foods are not recommended? The items listed may not be a complete list. Talk with your dietitian about what dietary choices are best for you. Grains Baked goods made with fat, such as croissants, muffins, or some breads. Dry pasta or rice meal packs. Vegetables Creamed or fried vegetables. Vegetables in a cheese sauce. Regular canned vegetables (not low-sodium or reduced-sodium). Regular canned tomato sauce and paste (not low-sodium or reduced-sodium). Regular tomato and vegetable juice (not low-sodium or reduced-sodium). Angie Fava. Olives. Fruits Canned fruit in a light or heavy syrup. Fried fruit. Fruit in cream or butter sauce. Meat and other protein foods Fatty cuts of meat. Ribs. Fried meat. Berniece Salines. Sausage. Bologna and other processed lunch meats. Salami. Fatback. Hotdogs. Bratwurst. Salted nuts and seeds. Canned beans with added salt. Canned or smoked fish. Whole eggs or egg yolks. Chicken or [place] with skin. Dairy Whole or 2% milk, cream, and half-and-half. Whole or full-fat  cream cheese. Whole-fat or sweetened yogurt. Full-fat cheese. Nondairy creamers. Whipped toppings. Processed cheese and cheese spreads. Fats and oils Butter. Stick margarine. Lard. Shortening. Ghee. Bacon fat. Tropical oils, such as coconut, palm kernel, or palm oil. Seasoning and other foods Salted popcorn and pretzels. Onion salt, garlic salt, seasoned salt, table salt, and sea salt. Worcestershire sauce. Tartar sauce. Barbecue sauce. Teriyaki sauce. Soy sauce, including reduced-sodium. Steak sauce. Canned and packaged gravies. Fish sauce. Oyster sauce. Cocktail sauce. Horseradish that you find on the shelf. Ketchup. Mustard. Meat flavorings and tenderizers. Bouillon cubes. Hot sauce and Tabasco sauce. Premade or packaged marinades. Premade or packaged taco seasonings. Relishes. Regular salad dressings. Where to find more information:  National Heart, Lung, and [place]: https://wilson-eaton.com/  American Heart Association: www.heart.org Summary  The DASH eating plan is a healthy eating plan that has been shown to reduce high blood pressure (hypertension). It may also reduce your risk for type 2 diabetes, heart disease, and stroke.  With the DASH eating plan, you should limit salt (sodium) intake to 2,300 mg a day. If you have hypertension, you may need to reduce your sodium intake to 1,500 mg a day.  When on the DASH eating plan, aim to eat more fresh fruits and vegetables, whole grains, lean proteins, low-fat dairy, and heart-healthy fats.  Work with your health care provider  or diet and nutrition specialist (dietitian) to adjust your eating plan to your individual calorie needs. This information is not intended to replace advice given to you by your health care provider. Make sure you discuss any questions you have with your health care provider. Document Released: 10/22/2011 Document Revised: 10/26/2016 Document Reviewed: 10/26/2016 Elsevier Interactive Patient Education  [employer].

## 2018-11-03 NOTE — Assessment & Plan Note (Signed)
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, 1.3 cm, with DCIS, margins negative, 0/4 lymph nodes negative, T1 cN0 stage IA pathologic stage, ER 100%, PR 100%, HER-2 negative ratio 1.39, Ki-67 20% (Originally 2 nodules were detected on screening mammogram 1.9 cm= fat necrosis and 8 mm IDC) Oncotype DX score 20, 13% risk of recurrence, intermediate risk Adjuvant radiation therapy started 07/20/2017completed 07/13/2016  Current treatment: Adjuvant antiestrogen therapy withanastrozole started 07/23/2016 but switched toletrozolein June 2018 due to hair loss, switched to tamoxifen 10/22/2017 due to continuing hair loss with letrozole  Tamoxifen toxicities: Denies any hot flashes or myalgias.  Hair loss: Patient has having extraordinary amount of emotional suffering from the hair loss that she is experiencing  Surveillance: 1. Breast exam 04/22/2017: Benign 2. Mammogram: Patient needs a mammogram performed.  Return to clinic in 1 year for follow-up

## 2018-11-04 ENCOUNTER — Telehealth: Payer: Self-pay | Admitting: Hematology and Oncology

## 2018-11-04 ENCOUNTER — Inpatient Hospital Stay: Payer: Medicare HMO | Attending: Hematology and Oncology | Admitting: Hematology and Oncology

## 2018-11-04 DIAGNOSIS — Z923 Personal history of irradiation: Secondary | ICD-10-CM | POA: Diagnosis not present

## 2018-11-04 DIAGNOSIS — C50412 Malignant neoplasm of upper-outer quadrant of left female breast: Secondary | ICD-10-CM | POA: Diagnosis not present

## 2018-11-04 DIAGNOSIS — Z79811 Long term (current) use of aromatase inhibitors: Secondary | ICD-10-CM | POA: Diagnosis not present

## 2018-11-04 DIAGNOSIS — L658 Other specified nonscarring hair loss: Secondary | ICD-10-CM | POA: Insufficient documentation

## 2018-11-04 DIAGNOSIS — Z79899 Other long term (current) drug therapy: Secondary | ICD-10-CM | POA: Insufficient documentation

## 2018-11-04 DIAGNOSIS — Z170 Estrogen receptor positive status [ER+]: Secondary | ICD-10-CM

## 2018-11-04 DIAGNOSIS — Z853 Personal history of malignant neoplasm of breast: Secondary | ICD-10-CM

## 2018-11-04 MED ORDER — ANASTROZOLE 1 MG PO TABS: 1.0000 mg | ORAL_TABLET | Freq: Every day | ORAL | 3 refills | Status: DC

## 2018-11-04 MED ORDER — ANASTROZOLE 1 MG PO TABS: 1.0000 mg | ORAL_TABLET | Freq: Every day | ORAL | 2 refills | Status: DC

## 2018-11-04 NOTE — Progress Notes (Signed)
Patient Care Team: [name], NP as PCP - General (Geriatric Medicine) Nicholas Lose, MD as Consulting Physician (Oncology) Erroll Luna, MD as Consulting Physician (General Surgery) Thea Silversmith, MD as Consulting Physician (Radiation Oncology)  DIAGNOSIS:  Encounter Diagnosis  Name Primary?  . Malignant neoplasm of upper-outer quadrant of left breast in female, estrogen receptor positive (Michele Page)     SUMMARY OF ONCOLOGIC HISTORY:   Breast cancer of upper-outer quadrant of left female breast ([place]ichele Page)   03/31/2016 Initial Diagnosis    Screening detected left breast mass, 2 nodules, 1.9 x 1.6 x 0.8 cm= fat necrosis; 8 x 7 x 7 mm= grade 2 IDC ER 100%, PR 100%, HER-2 negative ratio 1.39, Ki-67 20%    04/17/2016 Surgery    Left lumpectomy ([name]): IDC grade 2, 1.3 cm, with DCIS, margins negative, 0/4 lymph nodes negative, T1 cN0 stage IA pathologic stage, ER 100%, PR 100%, HER-2 negative ratio 1.39, Ki-67 20% Oncotype DX score 20, 13% ROR, intermediate risk    04/17/2016 Oncotype testing    Recurrence score: 20; ROR 15% (intermediate risk)     05/27/2016 - 07/13/2016 Radiation Therapy    Adjuvant radiation therapy [facility]): Left breast treated with breath hold to 50.4 Gy in 28 fractions at 1.8 Gy/fraction.  Left breast boosted to 10 Gy in 5 fractions at 2 Gy/fraction    07/23/2016 -  Anti-estrogen oral therapy    Anastrozole 1 mg switched to letrozole 04/22/2017 due to hair loss, switch to tamoxifen 10/22/2017 due to hair loss from letrozole as well     CHIEF COMPLIANT: Follow-up on anastrozole  INTERVAL HISTORY: Michele Page is a 62 year old with above-mentioned history of left breast cancer treated with lumpectomy radiation is currently on anastrozole.  She switched multiple medications and finally decided that it was not antiestrogen therapy that is causing her hair loss and hence she went back to anastrozole.  She is tolerating it fairly well.  She does not have any hot flashes.   She has not had a mammogram in the last year even though she thought she did.  REVIEW OF SYSTEMS:   Constitutional: Denies fevers, chills or abnormal weight loss Eyes: Denies blurriness of vision Ears, nose, mouth, throat, and face: Denies mucositis or sore throat Respiratory: Denies cough, dyspnea or wheezes Cardiovascular: Denies palpitation, chest discomfort Gastrointestinal:  Denies nausea, heartburn or change in bowel habits Skin: Denies abnormal skin rashes Lymphatics: Denies new lymphadenopathy or easy bruising Neurological:Denies numbness, tingling or new weaknesses Behavioral/Psych: Mood is stable, no new changes  Extremities: No lower extremity edema Breast: Small subcutaneous nodule in the left breast 11 o'clock position All other systems were reviewed with the patient and are negative.  I have reviewed the past medical history, past surgical history, social history and family history with the patient and they are unchanged from previous note.  ALLERGIES:  has No Known Allergies.  MEDICATIONS:  Current Outpatient Medications  Medication Sig Dispense Refill  . anastrozole (ARIMIDEX) 1 MG tablet Take 1 tablet (1 mg total) by mouth daily. 90 tablet 2  . aspirin EC 81 MG tablet Take 1 tablet (81 mg total) by mouth daily. 30 tablet 6  . labetalol (NORMODYNE) 300 MG tablet Take 2 tablets (600 mg total) by mouth 3 (three) times daily. Please keep upcoming for further refills, thanks! 180 tablet 1  . NIFEdipine (PROCARDIA-XL/ADALAT CC) 60 MG 24 hr tablet Take 1 tablet (60 mg total) by mouth 2 (two) times daily. 180 tablet 2  .  pravastatin (PRAVACHOL) 20 MG tablet Take 1 tablet (20 mg total) by mouth at bedtime. 90 tablet 3  . Suvorexant (BELSOMRA) 5 MG TABS Take 1 tablet by mouth at bedtime. For insomnia 30 tablet 0   No current facility-administered medications for this visit.     PHYSICAL EXAMINATION: ECOG PERFORMANCE STATUS: 1 - Symptomatic but completely  ambulatory  Vitals:   11/04/18 0924  BP: (!) 166/86  Pulse: 78  Resp: 17  Temp: 98.3 F (36.8 C)  SpO2: 99%   Filed Weights   11/04/18 0924  Weight: 204 lb 4.8 oz (92.7 kg)    GENERAL:alert, no distress and comfortable SKIN: skin color, texture, turgor are normal, no rashes or significant lesions EYES: normal, Conjunctiva are pink and non-injected, sclera clear OROPHARYNX:no exudate, no erythema and lips, buccal mucosa, and tongue normal  NECK: supple, thyroid normal size, non-tender, without nodularity LYMPH:  no palpable lymphadenopathy in the cervical, axillary or inguinal LUNGS: clear to auscultation and percussion with normal breathing effort HEART: regular rate & rhythm and no murmurs and no lower extremity edema ABDOMEN:abdomen soft, non-tender and normal bowel sounds MUSCULOSKELETAL:no cyanosis of digits and no clubbing  NEURO: alert & oriented x 3 with fluent speech, no focal motor/sensory deficits EXTREMITIES: No lower extremity edema BREAST: No significant palpable lumps or nodules in bilateral breasts or axilla.  There is a small subcutaneous nodule is palpable in the left breast 11 o'clock position. (exam performed in the presence of a chaperone)  LABORATORY DATA:  I have reviewed the data as listed CMP Latest Ref Rng & Units 10/24/2018 07/25/2018 06/24/2018  Glucose 65 - 99 mg/dL 91 104(H) 106(H)  BUN 7 - 25 mg/dL 17 17 17   Creatinine 0.50 - 0.99 mg/dL 1.21(H) 1.55(H) 1.53(H)  Sodium 135 - 146 mmol/L 139 140 143  Potassium 3.5 - 5.3 mmol/L 4.1 3.4(L) 3.9  Chloride 98 - 110 mmol/L 103 106 107  CO2 20 - 32 mmol/L 29 23 24   Calcium 8.6 - 10.4 mg/dL 9.7 9.2 9.5  Total Protein 6.1 - 8.1 g/dL - - 6.8  Total Bilirubin 0.2 - 1.2 mg/dL - - 0.5  Alkaline Phos 33 - 130 U/L - - -  AST 10 - 35 U/L - - 19  ALT 6 - 29 U/L 29 - 17    Lab Results  Component Value Date   WBC 4.7 07/25/2018   HGB 12.6 07/25/2018   HCT 38.0 07/25/2018   MCV 94.8 07/25/2018   PLT 308  07/25/2018   NEUTROABS 2,882 06/24/2018    ASSESSMENT & PLAN:  Breast cancer of upper-outer quadrant of left female breast ([name]) 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, 1.3 cm, with DCIS, margins negative, 0/4 lymph nodes negative, T1 cN0 stage IA pathologic stage, ER 100%, PR 100%, HER-2 negative ratio 1.39, Ki-67 20% (Originally 2 nodules were detected on screening mammogram 1.9 cm= fat necrosis and 8 mm IDC) Oncotype DX score 20, 13% risk of recurrence, intermediate risk Adjuvant radiation therapy started 07/20/2017completed 07/13/2016  Current treatment: Adjuvant antiestrogen therapy withanastrozole started 07/23/2016 but switched toletrozolein June 2018 due to hair loss, switched to tamoxifen 10/22/2017 due to continuing hair loss with letrozole, switched back to anastrozole  Anastrozole toxicities: Denies any hot flashes or myalgias.  Hair loss: Patient has having extraordinary amount of emotional suffering from the hair loss that she is experiencing She attributes the hair loss to pravastatin.  Surveillance: 1. Breast exam 04/22/2017: Benign 2. Mammogram: Patient needs a mammogram performed. I ordered  another mammogram to be done within a month.  I gave the patient a phone number to call as well.  I stressed the importance of getting annual mammograms. Return to clinic in 1 year for follow-up    No orders of the defined types were placed in this encounter.  The patient has a good understanding of the overall plan. she agrees with it. she will call with any problems that may develop before the next visit here.   Harriette Ohara, MD 11/04/18

## 2018-11-04 NOTE — Telephone Encounter (Signed)
Gave avs and calendar ° °

## 2018-11-22 ENCOUNTER — Encounter: Payer: Self-pay | Admitting: Nurse Practitioner

## 2018-11-22 ENCOUNTER — Ambulatory Visit
Admission: RE | Admit: 2018-11-22 | Discharge: 2018-11-22 | Disposition: A | Discharge status: Home / Self Care | Payer: Medicare HMO | Source: Ambulatory Visit | Attending: Nurse Practitioner | Admitting: Nurse Practitioner

## 2018-11-22 ENCOUNTER — Ambulatory Visit: Payer: Medicare HMO | Admitting: Neurology

## 2018-11-22 ENCOUNTER — Encounter

## 2018-11-22 ENCOUNTER — Ambulatory Visit (INDEPENDENT_AMBULATORY_CARE_PROVIDER_SITE_OTHER): Payer: Medicare HMO | Admitting: Nurse Practitioner

## 2018-11-22 VITALS — BP 128/80 | HR 71 | Temp 98.2°F | Ht 62.0 in | Wt 200.0 lb

## 2018-11-22 DIAGNOSIS — M25532 Pain in left wrist: Secondary | ICD-10-CM

## 2018-11-22 DIAGNOSIS — G4700 Insomnia, unspecified: Secondary | ICD-10-CM | POA: Diagnosis not present

## 2018-11-22 DIAGNOSIS — M79645 Pain in left finger(s): Secondary | ICD-10-CM | POA: Diagnosis not present

## 2018-11-22 MED ORDER — PREDNISONE 20 MG PO TABS: 20.0000 mg | ORAL_TABLET | Freq: Two times a day (BID) | ORAL | 0 refills | Status: DC

## 2018-11-22 MED ORDER — TRAZODONE HCL 50 MG PO TABS: 25.0000 mg | ORAL_TABLET | Freq: Every evening | ORAL | 3 refills | Status: DC | PRN

## 2018-11-22 NOTE — Patient Instructions (Addendum)
To get xray of hand/wrist Start prednisone tomorrow twice daily with food- do not take close to bedtime as it can effect sleep To use ice to wrist twice daily To use muscle rub (icy hot, ben gay, biofreeze) AFTER ice and as needed rub into painful area   To stop belsomra  Start trazodone as needed for sleep.   Follow up in 2 weeks for wrist pain and sleep

## 2018-11-22 NOTE — Progress Notes (Signed)
Careteam: Patient Care Team: [name], NP as PCP - General (Geriatric Medicine) Nicholas Lose, MD as Consulting Physician (Oncology) Erroll Luna, MD as Consulting Physician (General Surgery) Thea Silversmith, MD as Consulting Physician (Radiation Oncology)  Advanced Directive information    No Known Allergies  Chief Complaint  Patient presents with  . Acute Visit    Left hand pain x 1 month or longer (no known injury) and discuss sleeping pill (ineffective)      HPI: Patient is a 63 y.o. female seen in the office today left hand pain for 1 month that has gotten worse.  No injury noted. At the base of thumb, reports it was more in hand but this has improved.  No hx of gout or OA. Never had pain before.  Reports area is swollen. No redness. Right handed but due to hx of CVA with right sided weakness uses left hand more. Lifting heavy things at work.Reports at work she uses hand a lot with repeatative motion.  Not able to sleep due to pain but in the morning it does not bother her. Has not taken any medication for pain.  Painful to touch and when she moves her thumb.  7/10, when she presses area. Not painful when she does not touch area.  Overall getting better but still painful.  Reports belsomra is not helpful, this has been ongoing and is expensive. Feels like she needs something else to help her with sleep.   Review of Systems:  Review of Systems  Constitutional: Negative for chills and fever.  Musculoskeletal: Positive for joint pain.  Psychiatric/Behavioral: The patient has insomnia.     Past Medical History:  Diagnosis Date  . Arthritis    lt knee  . Breast cancer ([place])   . Breast cancer of upper-outer quadrant of left female breast ([place]) 04/02/2016  . Colon polyps   . COPD (chronic obstructive pulmonary disease) ([place])   . Hyperlipidemia   . Hypertension   . Multiple sclerosis ([place])   . Stroke ([place])    2013, mild cognitive deficits  . Vision  abnormalities    Past Surgical History:  Procedure Laterality Date  . HYSTEROSCOPY W/D&C N/A 12/19/2015   Procedure: DILATATION AND CURETTAGE /HYSTEROSCOPY;  Surgeon: Linda Hedges, DO;  Location: [facility];  Service: Gynecology;  Laterality: N/A;  . RADIOACTIVE SEED GUIDED PARTIAL MASTECTOMY WITH AXILLARY SENTINEL LYMPH NODE BIOPSY Left 04/17/2016   Procedure: RADIOACTIVE SEED GUIDED PARTIAL MASTECTOMY WITH AXILLARY SENTINEL LYMPH NODE BIOPSY;  Surgeon: Erroll Luna, MD;  Location: [facility];  Service: General;  Laterality: Left;  RADIOACTIVE SEED GUIDED PARTIAL MASTECTOMY WITH AXILLARY SENTINEL LYMPH NODE BIOPSY   . TUBAL LIGATION    . VAGINAL DELIVERY     x3   Social History:   reports that she quit smoking about 14 years ago. Her smoking use included cigarettes. She has a 12.50 pack-year smoking history. She has never used smokeless tobacco. She reports current alcohol use. She reports that she does not use drugs.  Family History  Problem Relation Age of Onset  . Stroke Sister   . Hypertension Mother   . Stroke Mother   . Alcohol abuse Brother   . HIV/AIDS Brother     Medications: Patient's Medications  New Prescriptions   No medications on file  Previous Medications   ANASTROZOLE (ARIMIDEX) 1 MG TABLET    Take 1 tablet (1 mg total) by mouth daily.   ASPIRIN EC 81 MG TABLET  Take 1 tablet (81 mg total) by mouth daily.   LABETALOL (NORMODYNE) 300 MG TABLET    Take 2 tablets (600 mg total) by mouth 3 (three) times daily. Please keep upcoming for further refills, thanks!   NIFEDIPINE (PROCARDIA-XL/ADALAT CC) 60 MG 24 HR TABLET    Take 1 tablet (60 mg total) by mouth 2 (two) times daily.   PRAVASTATIN (PRAVACHOL) 20 MG TABLET    Take 1 tablet (20 mg total) by mouth at bedtime.   SUVOREXANT (BELSOMRA) 5 MG TABS    Take 1 tablet by mouth at bedtime. For insomnia  Modified Medications   No medications on file  Discontinued Medications   No medications on file      Physical Exam:  Vitals:   11/22/18 1518  BP: 128/80  Pulse: 71  Temp: 98.2 F (36.8 C)  TempSrc: Oral  SpO2: 98%  Weight: 200 lb (90.7 kg)  Height: 5\' 2"  (1.575 m)   Body mass index is 36.58 kg/m.  Physical Exam Constitutional:      Appearance: Normal appearance. She is well-developed.  Neck:     Musculoskeletal: Neck supple.     Thyroid: No thyromegaly.     Vascular: No carotid bruit.     Trachea: No tracheal deviation.  Cardiovascular:     Rate and Rhythm: Normal rate and regular rhythm.     Heart sounds: Murmur (1/6 SEM) present. No friction rub. No gallop.      Comments: No LE edema b/l. no calf TTP.  Pulmonary:     Effort: Pulmonary effort is normal. No respiratory distress.     Breath sounds: Normal breath sounds. No stridor.  Abdominal:     Palpations: Abdomen is not rigid.  Musculoskeletal:     Left wrist: She exhibits tenderness.       Hands:     Comments: Tenderness noted to lateral wrist and Carpometacarpal joint with edema noted compared to right   Lymphadenopathy:     Cervical: No cervical adenopathy.  Skin:    General: Skin is warm and dry.     Findings: No bruising, erythema, lesion or rash.  Neurological:     Mental Status: She is alert and oriented to person, place, and time.     Deep Tendon Reflexes: Reflexes are normal and symmetric.  Psychiatric:        Speech: Speech is slurred.        Behavior: Behavior normal.        Thought Content: Thought content normal.        Judgment: Judgment normal.     Comments: Expressive aphasia     Labs reviewed: Basic Metabolic Panel: Recent Labs    06/24/18 1045 07/25/18 1631 10/24/18 1015  NA 143 140 139  K 3.9 3.4* 4.1  CL 107 106 103  CO2 24 23 29   GLUCOSE 106* 104* 91  BUN 17 17 17   CREATININE 1.53* 1.55* 1.21*  CALCIUM 9.5 9.2 9.7  TSH 1.70  --   --    Liver Function Tests: Recent Labs    11/24/17 0951 06/24/18 1045 10/24/18 1015  AST 21 19  --   ALT 19 17 29   BILITOT 0.5  0.5  --   PROT 7.0 6.8  --    No results for input(s): LIPASE, AMYLASE in the last 8760 hours. No results for input(s): AMMONIA in the last 8760 hours. CBC: Recent Labs    11/24/17 0951 06/24/18 1045 07/25/18 1631  WBC 4.4 4.4 4.7  NEUTROABS 2,702 2,882  --   HGB 12.3 11.7 12.6  HCT 36.1 34.7* 38.0  MCV 90.5 92.8 94.8  PLT 321 293 308   Lipid Panel: Recent Labs    11/24/17 0951 06/24/18 1045 10/24/18 1015  CHOL 178 205* 176  HDL 77 65 65  LDLCALC 82 121* 94  TRIG 98 88 84  CHOLHDL 2.3 3.2 2.7   TSH: Recent Labs    06/24/18 1045  TSH 1.70   A1C: No results found for: HGBA1C   Assessment/Plan 1. Left wrist pain -to use ice twice daily with muscle rub after ice and as needed  - DG Wrist Complete Left; Future - DG Hand Complete Left; Future - CBC with Differential/Platelets - Uric acid - predniSONE (DELTASONE) 20 MG tablet; Take 1 tablet (20 mg total) by mouth 2 (two) times daily with a meal.  Dispense: 6 tablet; Refill: 0  2. Insomnia, unspecified type To stop belsomra, sleep routine encouraged, to get pain under control from left wrist which could be contributing  - traZODone (DESYREL) 50 MG tablet; Take 0.5-1 tablets (25-50 mg total) by mouth at bedtime as needed for sleep.  Dispense: 30 tablet; Refill: 3  Next appt: follow up in 2 weeks.  Carlos American. [place], [facility] [phone]

## 2018-11-23 LAB — CBC WITH DIFFERENTIAL/PLATELET
Absolute Monocytes: 444 cells/uL (ref 200–950)
BASOS PCT: 1 %
Basophils Absolute: 51 cells/uL (ref 0–200)
EOS ABS: 82 {cells}/uL (ref 15–500)
Eosinophils Relative: 1.6 %
HCT: 36.3 % (ref 35.0–45.0)
Hemoglobin: 12.5 g/dL (ref 11.7–15.5)
Lymphs Abs: 1632 cells/uL (ref 850–3900)
MCH: 31.3 pg (ref 27.0–33.0)
MCHC: 34.4 g/dL (ref 32.0–36.0)
MCV: 90.8 fL (ref 80.0–100.0)
MPV: 10.4 fL (ref 7.5–12.5)
Monocytes Relative: 8.7 %
NEUTROS PCT: 56.7 %
Neutro Abs: 2892 cells/uL (ref 1500–7800)
Platelets: 324 10*3/uL (ref 140–400)
RBC: 4 10*6/uL (ref 3.80–5.10)
RDW: 12.9 % (ref 11.0–15.0)
TOTAL LYMPHOCYTE: 32 %
WBC: 5.1 10*3/uL (ref 3.8–10.8)

## 2018-11-23 LAB — URIC ACID: Uric Acid, Serum: 7.9 mg/dL — ABNORMAL HIGH (ref 2.5–7.0)

## 2018-11-25 ENCOUNTER — Ambulatory Visit: Payer: Self-pay

## 2018-11-26 ENCOUNTER — Other Ambulatory Visit: Payer: Self-pay | Admitting: Cardiovascular Disease

## 2018-11-26 ENCOUNTER — Other Ambulatory Visit: Payer: Self-pay

## 2018-11-26 ENCOUNTER — Emergency Department (HOSPITAL_COMMUNITY): Payer: Medicare HMO

## 2018-11-26 ENCOUNTER — Encounter (HOSPITAL_COMMUNITY): Payer: Self-pay

## 2018-11-26 ENCOUNTER — Emergency Department (HOSPITAL_COMMUNITY)
Admission: EM | Admit: 2018-11-26 | Discharge: 2018-11-26 | Disposition: A | Discharge status: Home / Self Care | Payer: Medicare HMO | Attending: Emergency Medicine | Admitting: Emergency Medicine

## 2018-11-26 DIAGNOSIS — J069 Acute upper respiratory infection, unspecified: Secondary | ICD-10-CM | POA: Diagnosis not present

## 2018-11-26 DIAGNOSIS — R05 Cough: Secondary | ICD-10-CM | POA: Insufficient documentation

## 2018-11-26 DIAGNOSIS — R5381 Other malaise: Secondary | ICD-10-CM | POA: Diagnosis not present

## 2018-11-26 DIAGNOSIS — R0602 Shortness of breath: Secondary | ICD-10-CM | POA: Diagnosis not present

## 2018-11-26 LAB — INFLUENZA PANEL BY PCR (TYPE A & B)
Influenza A By PCR: NEGATIVE
Influenza B By PCR: NEGATIVE

## 2018-11-26 LAB — BASIC METABOLIC PANEL
Anion gap: 10 (ref 5–15)
BUN: 14 mg/dL (ref 8–23)
CHLORIDE: 104 mmol/L (ref 98–111)
CO2: 25 mmol/L (ref 22–32)
Calcium: 9.3 mg/dL (ref 8.9–10.3)
Creatinine, Ser: 1.38 mg/dL — ABNORMAL HIGH (ref 0.44–1.00)
GFR calc Af Amer: 47 mL/min — ABNORMAL LOW (ref >60)
GFR calc non Af Amer: 41 mL/min — ABNORMAL LOW (ref >60)
GLUCOSE: 99 mg/dL (ref 70–99)
Potassium: 3.2 mmol/L — ABNORMAL LOW (ref 3.5–5.1)
Sodium: 139 mmol/L (ref 135–145)

## 2018-11-26 LAB — CBC WITH DIFFERENTIAL/PLATELET
Abs Immature Granulocytes: 0.03 10*3/uL (ref 0.00–0.07)
Basophils Absolute: 0 10*3/uL (ref 0.0–0.1)
Basophils Relative: 0 %
EOS ABS: 0 10*3/uL (ref 0.0–0.5)
Eosinophils Relative: 0 %
HCT: 37.3 % (ref 36.0–46.0)
Hemoglobin: 12.2 g/dL (ref 12.0–15.0)
Immature Granulocytes: 0 %
Lymphocytes Relative: 27 %
Lymphs Abs: 2.3 10*3/uL (ref 0.7–4.0)
MCH: 30.3 pg (ref 26.0–34.0)
MCHC: 32.7 g/dL (ref 30.0–36.0)
MCV: 92.6 fL (ref 80.0–100.0)
Monocytes Absolute: 0.7 10*3/uL (ref 0.1–1.0)
Monocytes Relative: 8 %
Neutro Abs: 5.6 10*3/uL (ref 1.7–7.7)
Neutrophils Relative %: 65 %
Platelets: 280 10*3/uL (ref 150–400)
RBC: 4.03 MIL/uL (ref 3.87–5.11)
RDW: 12.6 % (ref 11.5–15.5)
WBC: 8.7 10*3/uL (ref 4.0–10.5)
nRBC: 0 % (ref 0.0–0.2)

## 2018-11-26 MED ORDER — ALBUTEROL SULFATE HFA 108 (90 BASE) MCG/ACT IN AERS
2.0000 | INHALATION_SPRAY | Freq: Once | RESPIRATORY_TRACT | Status: AC
  Administered 2018-11-26: 2 via RESPIRATORY_TRACT
  Filled 2018-11-26: qty 6.7

## 2018-11-26 MED ORDER — SODIUM CHLORIDE 0.9 % IV BOLUS
1000.0000 mL | Freq: Once | INTRAVENOUS | Status: AC
  Administered 2018-11-26: 1000 mL via INTRAVENOUS

## 2018-11-26 MED ORDER — ALBUTEROL SULFATE (2.5 MG/3ML) 0.083% IN NEBU
2.5000 mg | INHALATION_SOLUTION | Freq: Once | RESPIRATORY_TRACT | Status: AC
  Administered 2018-11-26: 2.5 mg via RESPIRATORY_TRACT
  Filled 2018-11-26: qty 3

## 2018-11-26 MED ORDER — POTASSIUM CHLORIDE CRYS ER 20 MEQ PO TBCR
20.0000 meq | EXTENDED_RELEASE_TABLET | Freq: Once | ORAL | Status: AC
  Administered 2018-11-26: 20 meq via ORAL
  Filled 2018-11-26: qty 1

## 2018-11-26 NOTE — ED Provider Notes (Signed)
[facility] EMERGENCY DEPARTMENT Provider Note   CSN: [id number] Arrival date & time: 11/26/18  1241     History   Chief Complaint Chief Complaint  Patient presents with  . Cough  . Shortness of Breath    HPI Michele Page is a 63 y.o. female.  She has a history of stroke and has a prior tracheostomy that is healed over.  She is complaining of 2 or 3 days of sore throat cough shortness of breath and feeling generally weak.  She has not been taking good p.o.  She does not think there is been a fever.  The cough is been nonproductive.  The history is provided by the patient.  Cough  Cough characteristics:  Non-productive Severity:  Moderate Onset quality:  Gradual Timing:  Intermittent Progression:  Worsening Chronicity:  New Smoker: no   Context: not sick contacts   Relieved by:  Nothing Worsened by:  Nothing Associated symptoms: rhinorrhea, shortness of breath and sore throat   Associated symptoms: no chest pain, no chills, no fever and no rash   Shortness of Breath  Associated symptoms: cough and sore throat   Associated symptoms: no abdominal pain, no chest pain, no fever, no neck pain, no rash and no vomiting     Past Medical History:  Diagnosis Date  . Arthritis    lt knee  . Breast cancer ([place])   . Breast cancer of upper-outer quadrant of left female breast ([place]) 04/02/2016  . Colon polyps   . COPD (chronic obstructive pulmonary disease) ([facility])   . Hyperlipidemia   . Hypertension   . Multiple sclerosis ([place])   . Stroke ([place])    2013, mild cognitive deficits  . Vision abnormalities     Patient Active Problem List   Diagnosis Date Noted  . Neck pain on right side 06/01/2018  . Left shoulder pain 11/25/2017  . Benign paroxysmal positional vertigo 11/25/2017  . History of breast cancer 11/25/2017  . Insomnia 11/25/2017  . Breast cancer of upper-outer quadrant of left female breast ([place]) 04/02/2016  . Essential hypertension 01/10/2016  .  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 Gastroesophageal reflux disease without esophagitis 01/10/2016  . Hyperlipidemia 01/10/2016  . CKD (chronic kidney disease) 01/10/2016  . Multiple sclerosis ([place]) 03/20/2015  . Accelerated hypertension 03/20/2015  . CVA (cerebral infarction) 03/20/2015  . Hemiplegia following CVA (cerebrovascular accident) ([place]) 03/20/2015  . Cognitive disorder 03/20/2015  . Nocturia 03/20/2015  . Snoring 03/20/2015  . Aphasia S/P CVA 03/20/2015  . 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 HYSTEROSCOPY W/D&C N/A 12/19/2015   Procedure: DILATATION AND CURETTAGE /HYSTEROSCOPY;  Surgeon: Linda Hedges, DO;  Location: [facility];  Service: Gynecology;  Laterality: N/A;  . RADIOACTIVE SEED GUIDED PARTIAL MASTECTOMY WITH AXILLARY SENTINEL LYMPH NODE BIOPSY Left 04/17/2016   Procedure: RADIOACTIVE SEED GUIDED PARTIAL MASTECTOMY WITH AXILLARY SENTINEL LYMPH NODE BIOPSY;  Surgeon: Erroll Luna, MD;  Location: [facility];  Service: General;  Laterality: Left;  RADIOACTIVE SEED GUIDED PARTIAL MASTECTOMY WITH AXILLARY SENTINEL LYMPH NODE BIOPSY   . TUBAL LIGATION    . VAGINAL DELIVERY     x3     OB History    Gravida  3   Para  3   Term      Preterm      AB      Living  3  SAB      TAB      Ectopic      Multiple      Live Births               Home Medications    Prior to Admission medications   Medication Sig Start Date End Date Taking? Authorizing Provider  anastrozole (ARIMIDEX) 1 MG tablet Take 1 tablet (1 mg total) by mouth daily. 11/04/18   Nicholas Lose, MD  aspirin EC 81 MG tablet Take 1 tablet (81 mg total) by mouth daily. 03/01/15   Gildardo Cranker, DO  labetalol (NORMODYNE) 300 MG tablet Take 2 tablets (600 mg total) by mouth 3 (three) times daily. Please keep upcoming for further refills, thanks! 07/11/18    Lorretta Harp, MD  NIFEdipine (PROCARDIA-XL/ADALAT CC) 60 MG 24 hr tablet Take 1 tablet (60 mg total) by mouth 2 (two) times daily. 04/19/18   Gildardo Cranker, DO  pravastatin (PRAVACHOL) 20 MG tablet Take 1 tablet (20 mg total) by mouth at bedtime. 08/16/18   Lorretta Harp, MD  predniSONE (DELTASONE) 20 MG tablet Take 1 tablet (20 mg total) by mouth 2 (two) times daily with a meal. 11/22/18   Eubanks, Carlos American, NP  Suvorexant (BELSOMRA) 5 MG TABS Take 1 tablet by mouth at bedtime. For insomnia 09/12/18   Gildardo Cranker, DO  traZODone (DESYREL) 50 MG tablet Take 0.5-1 tablets (25-50 mg total) by mouth at bedtime as needed for sleep. 11/22/18   [name], NP    Family History Family History  Problem Relation Age of Onset  . Stroke Sister   . Hypertension Mother   . Stroke Mother   . Alcohol abuse Brother   . HIV/AIDS Brother     Social History Social History   Tobacco Use  . Smoking status: Former Smoker    Packs/day: 0.50    Years: 25.00    Pack years: 12.50    Types: Cigarettes    Last attempt to quit: 11/16/2004    Years since quitting: 14.0  . Smokeless tobacco: Never Used  Substance Use Topics  . Alcohol use: Yes    Alcohol/week: 0.0 standard drinks    Comment: occas  . Drug use: No     Allergies   Patient has no known allergies.   Review of Systems Review of Systems  Constitutional: Positive for appetite change and fatigue. Negative for chills and fever.  HENT: Positive for rhinorrhea and sore throat.   Eyes: Negative for visual disturbance.  Respiratory: Positive for cough and shortness of breath.   Cardiovascular: Negative for chest pain.  Gastrointestinal: Negative for abdominal pain, diarrhea, nausea and vomiting.  Genitourinary: Negative for dysuria.  Musculoskeletal: Negative for neck pain.  Skin: Negative for rash.  Neurological: Negative for syncope.     Physical Exam Updated Vital Signs BP 120/80 (BP Location: Right Arm)   Pulse 78    Temp 98.2 F (36.8 C) (Oral)   Resp 16   Ht 5\' 2"  (1.575 m)   Wt 88 kg   SpO2 99%   BMI 35.48 kg/m   Physical Exam Vitals signs and nursing note reviewed.  Constitutional:      General: She is not in acute distress.    Appearance: She is well-developed.  HENT:     Head: Normocephalic and atraumatic.  Eyes:     Conjunctiva/sclera: Conjunctivae normal.  Neck:     Musculoskeletal: Neck supple.  Cardiovascular:     Rate and  Rhythm: Normal rate and regular rhythm.     Heart sounds: No murmur.  Pulmonary:     Effort: Pulmonary effort is normal. No respiratory distress.     Breath sounds: No stridor. Rhonchi (few scattered) present.  Abdominal:     Palpations: Abdomen is soft.     Tenderness: There is no abdominal tenderness.  Musculoskeletal: Normal range of motion.     Right lower leg: She exhibits no tenderness. No edema.     Left lower leg: She exhibits no tenderness. No edema.  Skin:    General: Skin is warm and dry.     Capillary Refill: Capillary refill takes less than 2 seconds.  Neurological:     Mental Status: She is alert. Mental status is at baseline.      ED Treatments / Results  Labs (all labs ordered are listed, but only abnormal results are displayed) Labs Reviewed  BASIC METABOLIC PANEL - Abnormal; Notable for the following components:      Result Value   Potassium 3.2 (*)    Creatinine, Ser 1.38 (*)    GFR calc non Af Amer 41 (*)    GFR calc Af Amer 47 (*)    All other components within normal limits  CBC WITH DIFFERENTIAL/PLATELET  INFLUENZA PANEL BY PCR (TYPE A & B)    EKG None  Radiology Dg Chest 2 View  Result Date: 11/26/2018 CLINICAL DATA:  Cough, shortness of breath. EXAM: CHEST - 2 VIEW COMPARISON:  Radiographs of July 25, 2018. FINDINGS: The heart size and mediastinal contours are within normal limits. Both lungs are clear. No pneumothorax or pleural effusion is noted. The visualized skeletal structures are unremarkable.  IMPRESSION: No active cardiopulmonary disease. Electronically Signed   By: Marijo Conception, M.D.   On: 11/26/2018 14:23    Procedures Procedures (including critical care time)  Medications Ordered in ED Medications  albuterol (PROVENTIL) (2.5 MG/3ML) 0.083% nebulizer solution 2.5 mg (2.5 mg Nebulization Given 11/26/18 1306)  sodium chloride 0.9 % bolus 1,000 mL (0 mLs Intravenous Stopped 11/26/18 1453)  potassium chloride SA (K-DUR,KLOR-CON) CR tablet 20 mEq (20 mEq Oral Given 11/26/18 1458)  albuterol (PROVENTIL HFA;VENTOLIN HFA) 108 (90 Base) MCG/ACT inhaler 2 puff (2 puffs Inhalation Given 11/26/18 1501)     Initial Impression / Assessment and Plan / ED Course  I have reviewed the triage vital signs and the nursing notes.  Pertinent labs & imaging results that were available during my care of the patient were reviewed by me and considered in my medical decision making (see chart for details).  Clinical Course as of Nov 26 1648  Sat Nov 26, 2018  1430 Patient states she is felt a little bit better with the nebulizer treatment.  The rest of her lab work is fairly unremarkable other than a mildly low potassium of 3.2.  She has had some chronic renal insufficiency.  Chest x-ray did not show any obvious infiltrates.  Flu testing is still pending.   [MB]  1503 Patient has received her IV fluids and potassium.  We have gotten some instructions on how to use an inhaler.  She is eaten here and is feeling a little more energized.  Flu test is negative white count is normal sats are 99%.  I think this is most of the upper respiratory viral infection and she is going to need to stay well-hydrated and follow-up with her doctor.  She is comfortable with the plan.   [MB]  Clinical Course User Index [MB] Hayden Rasmussen, MD      Final Clinical Impressions(s) / ED Diagnoses   Final diagnoses:  Upper respiratory tract infection, unspecified type  [facility]    ED Discharge Orders    None        Hayden Rasmussen, MD 11/26/18 1650

## 2018-11-26 NOTE — ED Triage Notes (Signed)
Per pt, 2 days ago started with sore throat, cough, sob, dizziness. Pt also reports feeling weakness to legs. Has not had good PO intake last 2 days

## 2018-11-26 NOTE — ED Notes (Signed)
Pt able to drink and eat

## 2018-11-26 NOTE — Discharge Instructions (Addendum)
You were seen in the emergency department for cough and shortness of breath along with some fatigue and lack of appetite.  Your blood work did not show an obvious cause of your symptoms and your chest x-ray was negative for pneumonia.  This is likely upper respiratory infection and probably viral.  Keep well-hydrated.  You may use the inhaler of albuterol 2 puffs every 4-6 hours as needed.  Please follow-up with your doctor and return if any worsening symptoms.

## 2018-11-29 ENCOUNTER — Encounter: Payer: Medicare HMO | Admitting: Family

## 2018-12-01 ENCOUNTER — Telehealth: Payer: Self-pay | Admitting: Neurology

## 2018-12-01 ENCOUNTER — Encounter: Payer: Self-pay | Admitting: Neurology

## 2018-12-01 ENCOUNTER — Ambulatory Visit: Payer: Medicare HMO | Admitting: Neurology

## 2018-12-01 VITALS — BP 138/78 | HR 70 | Ht 62.0 in | Wt 196.0 lb

## 2018-12-01 DIAGNOSIS — N183 Chronic kidney disease, stage 3 unspecified: Secondary | ICD-10-CM

## 2018-12-01 DIAGNOSIS — I69359 Hemiplegia and hemiparesis following cerebral infarction affecting unspecified side: Secondary | ICD-10-CM

## 2018-12-01 DIAGNOSIS — Z853 Personal history of malignant neoplasm of breast: Secondary | ICD-10-CM

## 2018-12-01 DIAGNOSIS — G35 Multiple sclerosis: Secondary | ICD-10-CM

## 2018-12-01 DIAGNOSIS — I10 Essential (primary) hypertension: Secondary | ICD-10-CM | POA: Diagnosis not present

## 2018-12-01 NOTE — Progress Notes (Signed)
[facility]  PATIENT: Michele Page DOB: 23-Sep-1956  REFERRING DOCTOR OR PCP:  Gildardo Cranker SOURCE: patient  _________________________________   HISTORICAL  CHIEF COMPLAINT:  Chief Complaint  Patient presents with  . Follow-up    RM 13, alone. Last seen 03/18/2017. Doing well, no new sx. Still in remission from breast cancer.   . Multiple Sclerosis    Not on DMT    HISTORY OF PRESENT ILLNESS:  Michele Page is a 63 y.o. woman with MS and h/o CVA.       Update 12/01/2018: She feels she is doing well.  She has never been on a DMT.   No new MS symptoms.   However, her gait is a little worse and she is stumbling more.  He right side is a little slower and clumsy compared to her left. She notes right foot drop, worse with long distances.  No dysesthesia or numbness.   Bladder is about the same --- has frequency and urgency but some urge incontinence   She had a  Brain MRI 02/27/17 showing multiple lesions, some periventricular and others more nonspecific.  Likely significant small vessel ischemic overlap.    None were acute.   Sometimes she has fatigue.   Mild cognitive issues with verbal fluency issues.   She has short term memory loss but LTM is good.    She has breast cancer that seems to be doing well.     She does f/u appts regularly.  She had chemotherapy.     From 03/18/2017: Since last visit, she has also been treated for breast cancer with lumpectomy (03/2016) and radiation and appears to be in remission.   Patient is poor historian.  MS:   She continues off medications.  I reviewed the MRI of the brain from 02/27/2017. There is a remote hemorrhage involving the left thalamus and adjacent internal capsule little bit of the basal ganglia. Some wallerian degeneration is noted. She also has extensive T2/FLAIR hyperintense foci in the hemispheres some radially oriented in the ventricles and some of the juxtacortical white matter. This likely represents a combination of  demyelination and chronic microvascular ischemic change.  There were no acute findings.  MS History:   About 2008, she had the onset of left sided numbness. The symptoms came on fairly quickly. They resolved over 2-3 weeks. She had an MRI at the time that showed lesions consistent with MS. She thinks she may have had a lumbar puncture as well. Her doctor at the time advised that she start medications for MS. However, all of the options were self injectable at that time and she preferred not to begin any of the therapies. She feels that she has done fairly well since that time and denies any new exacerbations. A few months ago, she moved down to [place] from [place].  Stroke History:  She has elevated blood pressure and had around 2011. She had the sudden onset of right arm and leg weakness. Additionally, she was having a lot of speech difficulties initially. Over time, the weakness improved and her speech improved, though she does not feel that they got back to baseline. She notes difficulty with handwriting and other skilled tasks with the right hand, even though she feels her strength is fine.Marland Kitchen MRI was performed in the hospital at that time (she does not remember where). Her symptoms and imaging studies were felt to be more consistent with stroke than to an MS exacerbation. She was placed on  aspirin and continues to take 1 baby aspirin daily.  Gait/strength/sensation: Since the stroke there is some right sided weakness and very mild numbness and clumsiness.   This has led to a clumsy gait.   She walks without a cane and the right foot sometimes drags a bit She notes mild weakness in the right leg. She denies any numbness in either leg. She denies any actual weakness in the right arm but notes that since his stroke that her handwriting has been poor.  Bladder/Bowel:   She denies any significant bowel or bladder issue.  She has 1-2 x nocturia at night.  Vision: She denies any significant problems  with her vision. She wears glasses for correction but notes no MS related vision problem. There is no diplopia.   Fatigue/Sleep:   She feels her fatigue is minimal and she notes no major difficulty with heat. She falls asleep easily.  She has 1-2 nocturia and quickly falls back asleep.   She snores but denies any excessive daytime sleepiness. Her husband reportedly has not noted pauses or gasping at night.  Mood/Cognition:   She denies anxiety or depression.       Cognition   She has major cognitive difficulty.  She has a lot of difficulty with memory and often needs hints/cues.   She feels LTM is better.   She notes some difficulty coming up with the right words.   She also feels processing speed is reduced.  REVIEW OF SYSTEMS: Constitutional: No fevers, chills, sweats, or change in appetite Eyes: No visual changes, double vision, eye pain Ear, nose and throat: No hearing loss, ear pain, nasal congestion, sore throat Cardiovascular: No chest pain, palpitations Respiratory: No shortness of breath at rest or with exertion.   No wheezes GastrointestinaI: No nausea, vomiting, diarrhea, abdominal pain, fecal incontinence Genitourinary: No dysuria, urinary retention or frequency.  She has nocturia. She has mildly elevated creatinine  Musculoskeletal: No neck pain, back pain Integumentary: No rash, pruritus, skin lesions Neurological: as above Psychiatric: No depression at this time.  No anxiety.  She notes some memory loss. Endocrine: No palpitations, diaphoresis, change in appetite, change in weigh or increased thirst Hematologic/Lymphatic: No anemia, purpura, petechiae. Allergic/Immunologic: No itchy/runny eyes, nasal congestion, recent allergic reactions, rashes  ALLERGIES: No Known Allergies  HOME MEDICATIONS:  Current Outpatient Medications:  .  anastrozole (ARIMIDEX) 1 MG tablet, Take 1 tablet (1 mg total) by mouth daily., Disp: 90 tablet, Rfl: 3 .  aspirin EC 81 MG tablet, Take 1  tablet (81 mg total) by mouth daily., Disp: 30 tablet, Rfl: 6 .  labetalol (NORMODYNE) 300 MG tablet, Take 2 tablets (600 mg total) by mouth 3 (three) times daily. Please keep upcoming for further refills, thanks!, Disp: 180 tablet, Rfl: 1 .  NIFEdipine (ADALAT CC) 60 MG 24 hr tablet, TAKE 1 TABLET BY MOUTH TWICE DAILY, Disp: 120 tablet, Rfl: 0 .  pravastatin (PRAVACHOL) 20 MG tablet, Take 1 tablet (20 mg total) by mouth at bedtime., Disp: 90 tablet, Rfl: 3 .  predniSONE (DELTASONE) 20 MG tablet, Take 1 tablet (20 mg total) by mouth 2 (two) times daily with a meal., Disp: 6 tablet, Rfl: 0 .  traZODone (DESYREL) 50 MG tablet, Take 0.5-1 tablets (25-50 mg total) by mouth at bedtime as needed for sleep., Disp: 30 tablet, Rfl: 3  PAST MEDICAL HISTORY: Past Medical History:  Diagnosis Date  . Arthritis    lt knee  . Breast cancer ([place])   . Breast cancer  of upper-outer quadrant of left female breast ([place]) 04/02/2016  . Colon polyps   . COPD (chronic obstructive pulmonary disease) ([place])   . Hyperlipidemia   . Hypertension   . Multiple sclerosis ([place])   . Stroke ([place])    2013, mild cognitive deficits  . Vision abnormalities     PAST SURGICAL HISTORY: Past Surgical History:  Procedure Laterality Date  . HYSTEROSCOPY W/D&C N/A 12/19/2015   Procedure: DILATATION AND CURETTAGE /HYSTEROSCOPY;  Surgeon: Linda Hedges, DO;  Location: [facility];  Service: Gynecology;  Laterality: N/A;  . RADIOACTIVE SEED GUIDED PARTIAL MASTECTOMY WITH AXILLARY SENTINEL LYMPH NODE BIOPSY Left 04/17/2016   Procedure: RADIOACTIVE SEED GUIDED PARTIAL MASTECTOMY WITH AXILLARY SENTINEL LYMPH NODE BIOPSY;  Surgeon: Erroll Luna, MD;  Location: [facility];  Service: General;  Laterality: Left;  RADIOACTIVE SEED GUIDED PARTIAL MASTECTOMY WITH AXILLARY SENTINEL LYMPH NODE BIOPSY   . TUBAL LIGATION    . VAGINAL DELIVERY     x3    FAMILY HISTORY: Family History  Problem Relation Age of Onset  . Stroke Sister   .  Hypertension Mother   . Stroke Mother   . Alcohol abuse Brother   . HIV/AIDS Brother     SOCIAL HISTORY:  Social History   Socioeconomic History  . Marital status: Married    Spouse name: Not on file  . Number of children: 3  . Years of education: Not on file  . Highest education level: Not on file  Occupational History  . Occupation: retired  Scientific laboratory technician  . Financial resource strain: Not hard at all  . Food insecurity:    Worry: Never true    Inability: Never true  . Transportation needs:    Medical: No    Non-medical: No  Tobacco Use  . Smoking status: Former Smoker    Packs/day: 0.50    Years: 25.00    Pack years: 12.50    Types: Cigarettes    Last attempt to quit: 11/16/2004    Years since quitting: 14.0  . Smokeless tobacco: Never Used  Substance and Sexual Activity  . Alcohol use: Yes    Alcohol/week: 0.0 standard drinks    Comment: occas  . Drug use: No  . Sexual activity: Yes    Comment: 1st intercourse 44 yo-5 partners  Lifestyle  . Physical activity:    Days per week: 1 day    Minutes per session: 90 min  . Stress: Only a little  Relationships  . Social connections:    Talks on phone: Twice a week    Gets together: Once a week    Attends religious service: More than 4 times per year    Active member of club or organization: No    Attends meetings of clubs or organizations: Never    Relationship status: Married  . Intimate partner violence:    Fear of current or ex partner: No    Emotionally abused: No    Physically abused: No    Forced sexual activity: No  Other Topics Concern  . Not on file  Social History Narrative   Diet- N/A   Caffeine- Yes   Married- Yes   House- 2 story with 2 people   Pets- No   Current/past profession- Insurance underwriter daycare   Exercise- Yes   Living will-No   DNR-N/A   POA/HPOA-No        PHYSICAL EXAM  Vitals:   12/01/18 1257  BP: 138/78  Pulse: 70  SpO2:  98%  Weight: 196 lb (88.9 kg)  Height: 5\' 2"   (1.575 m)    Body mass index is 35.85 kg/m.   General: The patient is well-developed and well-nourished and in no acute distress   Neurologic Exam  Mental status: The patient is alert and oriented x 3 at the time of the examination. The patient has reduced recent and remote memory, and a reduced  apparently attention span and concentration ability.   Speech has a few errors.  She understands well.  She has a right hand apraxia.  Cranial nerves: Extraocular movements are full. Pupils are equal, round, and reactive to light and accomodation.  Facial strength and sensation is normal. Trapezius and sternocleidomastoid strength is normal. No dysarthria is noted.  The tongue is midline, and the patient has symmetric elevation of the soft palate. No obvious hearing deficits are noted.  Motor:  Muscle bulk is normal.   Tone is normal. Strength is  5 / 5 in  both arms and the left leg and 4+ over 5 in the right leg.   Sensory: Sensory testing is intact to pinprick, soft touch and vibration sensation in all 4 extremities.  Coordination: Cerebellar testing reveals good  left and mildly reduced right finger-nose-finger and  poor right heel-to-shin . She has difficulty writing  Gait and station: Station is normal.   Gait is wide with slight right foot drop.  Tandem gait is wide.    Romberg is negative.   Reflexes: Deep tendon reflexes are symmetric and normal bilaterally in arms and slightly elevated in the right leg compared to the left.       DIAGNOSTIC DATA (LABS, IMAGING, TESTING) - I reviewed patient records, labs, notes, testing and imaging myself where available.  Lab Results  Component Value Date   WBC 8.7 11/26/2018   HGB 12.2 11/26/2018   HCT 37.3 11/26/2018   MCV 92.6 11/26/2018   PLT 280 11/26/2018      Component Value Date/Time   NA 139 11/26/2018 1320   NA 141 04/08/2016 0904   K 3.2 (L) 11/26/2018 1320   K 4.7 04/08/2016 0904   CL 104 11/26/2018 1320   CO2 25  11/26/2018 1320   CO2 26 04/08/2016 0904   GLUCOSE 99 11/26/2018 1320   GLUCOSE 124 04/08/2016 0904   BUN 14 11/26/2018 1320   BUN 16.3 04/08/2016 0904   CREATININE 1.38 (H) 11/26/2018 1320   CREATININE 1.21 (H) 10/24/2018 1015   CREATININE 1.5 (H) 04/08/2016 0904   CALCIUM 9.3 11/26/2018 1320   CALCIUM 10.0 04/08/2016 0904   PROT 6.8 06/24/2018 1045   PROT 7.6 04/08/2016 0904   ALBUMIN 4.6 05/26/2017 0932   ALBUMIN 4.3 04/08/2016 0904   AST 19 06/24/2018 1045   AST 25 04/08/2016 0904   ALT 29 10/24/2018 1015   ALT 23 04/08/2016 0904   ALKPHOS 81 05/26/2017 0932   ALKPHOS 90 04/08/2016 0904   BILITOT 0.5 06/24/2018 1045   BILITOT 0.58 04/08/2016 0904   GFRNONAA 41 (L) 11/26/2018 1320   GFRNONAA 48 (L) 10/24/2018 1015   GFRAA 47 (L) 11/26/2018 1320   GFRAA 55 (L) 10/24/2018 1015    Lab Results  Component Value Date   TSH 1.70 06/24/2018       ASSESSMENT AND PLAN  Hemiplegia following CVA (cerebrovascular accident) ([place])  Multiple sclerosis ([place]) - Plan: MR BRAIN WO CONTRAST  Accelerated hypertension  Stage 3 chronic kidney disease ([place])  History of breast cancer  1.  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   We will check an MRI of the brain.   Not currently on a disease modifying therapy.  If any subclinical progression would recommend a DMT, otherwise she will remain off.    2.   We discussed the importance of staying on her hypertensive medication 3.    She will return to see me in one year or sooner if there are new or worsening neurologic symptoms. Richard A. Felecia Shelling, MD, PhD 1/77/9390, 3:00 PM Certified in Neurology, Clinical Neurophysiology, Sleep Medicine, Pain Medicine and Neuroimaging  [facility] [address] [place], [place] [address] [phone]

## 2018-12-01 NOTE — Telephone Encounter (Signed)
Mcarthur Rossetti Josem Kaufmann: [id number] (exp. 12/01/18 to 12/31/18) order sent to GI.

## 2018-12-05 ENCOUNTER — Ambulatory Visit
Admission: RE | Admit: 2018-12-05 | Discharge: 2018-12-05 | Disposition: A | Discharge status: Home / Self Care | Payer: Medicare HMO | Source: Ambulatory Visit | Attending: Hematology and Oncology | Admitting: Hematology and Oncology

## 2018-12-05 DIAGNOSIS — Z170 Estrogen receptor positive status [ER+]: Principal | ICD-10-CM

## 2018-12-05 DIAGNOSIS — Z9012 Acquired absence of left breast and nipple: Secondary | ICD-10-CM | POA: Diagnosis not present

## 2018-12-05 DIAGNOSIS — C50412 Malignant neoplasm of upper-outer quadrant of left female breast: Secondary | ICD-10-CM

## 2018-12-05 HISTORY — DX: Personal history of antineoplastic chemotherapy: Z92.21

## 2018-12-05 NOTE — Telephone Encounter (Signed)
Spoke to the patient she is aware.  °

## 2018-12-08 ENCOUNTER — Ambulatory Visit (INDEPENDENT_AMBULATORY_CARE_PROVIDER_SITE_OTHER): Payer: Medicare HMO | Admitting: Nurse Practitioner

## 2018-12-08 ENCOUNTER — Encounter: Payer: Self-pay | Admitting: Nurse Practitioner

## 2018-12-08 VITALS — BP 114/70 | HR 70 | Temp 98.0°F | Resp 10 | Ht 62.0 in | Wt 197.0 lb

## 2018-12-08 VITALS — BP 114/70 | HR 70 | Temp 98.0°F | Ht 62.0 in | Wt 197.2 lb

## 2018-12-08 DIAGNOSIS — Z0000 Encounter for general adult medical examination without abnormal findings: Secondary | ICD-10-CM

## 2018-12-08 DIAGNOSIS — E876 Hypokalemia: Secondary | ICD-10-CM | POA: Diagnosis not present

## 2018-12-08 DIAGNOSIS — M109 Gout, unspecified: Secondary | ICD-10-CM

## 2018-12-08 LAB — BASIC METABOLIC PANEL WITH GFR
BUN / CREAT RATIO: 9 (calc) (ref 6–22)
BUN: 13 mg/dL (ref 7–25)
CO2: 29 mmol/L (ref 20–32)
Calcium: 9.8 mg/dL (ref 8.6–10.4)
Chloride: 103 mmol/L (ref 98–110)
Creat: 1.38 mg/dL — ABNORMAL HIGH (ref 0.50–0.99)
GFR, Est African American: 47 mL/min/{1.73_m2} — ABNORMAL LOW (ref >=60)
GFR, Est Non African American: 41 mL/min/{1.73_m2} — ABNORMAL LOW (ref >=60)
Glucose, Bld: 93 mg/dL (ref 65–139)
Potassium: 4.3 mmol/L (ref 3.5–5.3)
Sodium: 141 mmol/L (ref 135–146)

## 2018-12-08 LAB — URIC ACID: Uric Acid, Serum: 7.7 mg/dL — ABNORMAL HIGH (ref 2.5–7.0)

## 2018-12-08 NOTE — Patient Instructions (Signed)
Michele Page , Thank you for taking time to come for your Medicare Wellness Visit. I appreciate your ongoing commitment to your health goals. Please review the following plan we discussed and let me know if I can assist you in the future.   Screening recommendations/referrals: Colonoscopy- schedule follow up colonoscopy you are over due Mammogram - up to date Bone Density-plans to get through GYn Recommended yearly ophthalmology/optometry visit for glaucoma screening and checkup Recommended yearly dental visit for hygiene and checkup  Vaccinations: consider getting immunizations  Influenza vaccine - up to date Pneumococcal vaccine need Tdap vaccine nded Shingles vaccine need    Advanced directives: to read over, complete and bring back to next visit.   Next appointment: 1 year for AWV  Preventive Care 40-64 Years, Female Preventive care refers to lifestyle choices and visits with your health care provider that can promote health and wellness. What does preventive care include?  A yearly physical exam. This is also called an annual well check.  Dental exams once or twice a year.  Routine eye exams. Ask your health care provider how often you should have your eyes checked.  Personal lifestyle choices, including:  Daily care of your teeth and gums.  Regular physical activity.  Eating a healthy diet.  Avoiding tobacco and drug use.  Limiting alcohol use.  Practicing safe sex.  Taking low-dose aspirin daily starting at age 64.  Taking vitamin and mineral supplements as recommended by your health care provider. What happens during an annual well check? The services and screenings done by your health care provider during your annual well check will depend on your age, overall health, lifestyle risk factors, and family history of disease. Counseling  Your health care provider may ask you questions about your:  Alcohol use.  Tobacco use.  Drug use.  Emotional  well-being.  Home and relationship well-being.  Sexual activity.  Eating habits.  Work and work Statistician.  Method of birth control.  Menstrual cycle.  Pregnancy history. Screening  You may have the following tests or measurements:  Height, weight, and BMI.  Blood pressure.  Lipid and cholesterol levels. These may be checked every 5 years, or more frequently if you are over 3 years old.  Skin check.  Lung cancer screening. You may have this screening every year starting at age 66 if you have a 30-pack-year history of smoking and currently smoke or have quit within the past 15 years.  Fecal occult blood test (FOBT) of the stool. You may have this test every year starting at age 46.  Flexible sigmoidoscopy or colonoscopy. You may have a sigmoidoscopy every 5 years or a colonoscopy every 10 years starting at age 43.  Hepatitis C blood test.  Hepatitis B blood test.  Sexually transmitted disease (STD) testing.  Diabetes screening. This is done by checking your blood sugar (glucose) after you have not eaten for a while (fasting). You may have this done every 1-3 years.  Mammogram. This may be done every 1-2 years. Talk to your health care provider about when you should start having regular mammograms. This may depend on whether you have a family history of breast cancer.  BRCA-related cancer screening. This may be done if you have a family history of breast, ovarian, tubal, or peritoneal cancers.  Pelvic exam and Pap test. This may be done every 3 years starting at age 48. Starting at age 54, this may be done every 5 years if you have a Pap test in  combination with an HPV test.  Bone density scan. This is done to screen for osteoporosis. You may have this scan if you are at high risk for osteoporosis. Discuss your test results, treatment options, and if necessary, the need for more tests with your health care provider. Vaccines  Your health care provider may recommend  certain vaccines, such as:  Influenza vaccine. This is recommended every year.  Tetanus, diphtheria, and acellular pertussis (Tdap, Td) vaccine. You may need a Td booster every 10 years.  Zoster vaccine. You may need this after age 32.  Pneumococcal 13-valent conjugate (PCV13) vaccine. You may need this if you have certain conditions and were not previously vaccinated.  Pneumococcal polysaccharide (PPSV23) vaccine. You may need one or two doses if you smoke cigarettes or if you have certain conditions. Talk to your health care provider about which screenings and vaccines you need and how often you need them. This information is not intended to replace advice given to you by your health care provider. Make sure you discuss any questions you have with your health care provider. Document Released: 11/29/2015 Document Revised: 07/22/2016 Document Reviewed: 09/03/2015 Elsevier Interactive Patient Education  2017 [employer] Prevention in the Home Falls can cause injuries. They can happen to people of all ages. There are many things you can do to make your home safe and to help prevent falls. What can I do on the outside of my home?  Regularly fix the edges of walkways and driveways and fix any cracks.  Remove anything that might make you trip as you walk through a door, such as a raised step or threshold.  Trim any bushes or trees on the path to your home.  Use bright outdoor lighting.  Clear any walking paths of anything that might make someone trip, such as rocks or tools.  Regularly check to see if handrails are loose or broken. Make sure that both sides of any steps have handrails.  Any raised decks and porches should have guardrails on the edges.  Have any leaves, snow, or ice cleared regularly.  Use sand or salt on walking paths during winter.  Clean up any spills in your garage right away. This includes oil or grease spills. What can I do in the bathroom?  Use  night lights.  Install grab bars by the toilet and in the tub and shower. Do not use towel bars as grab bars.  Use non-skid mats or decals in the tub or shower.  If you need to sit down in the shower, use a plastic, non-slip stool.  Keep the floor dry. Clean up any water that spills on the floor as soon as it happens.  Remove soap buildup in the tub or shower regularly.  Attach bath mats securely with double-sided non-slip rug tape.  Do not have throw rugs and other things on the floor that can make you trip. What can I do in the bedroom?  Use night lights.  Make sure that you have a light by your bed that is easy to reach.  Do not use any sheets or blankets that are too big for your bed. They should not hang down onto the floor.  Have a firm chair that has side arms. You can use this for support while you get dressed.  Do not have throw rugs and other things on the floor that can make you trip. What can I do in the kitchen?  Clean up any spills  right away.  Avoid walking on wet floors.  Keep items that you use a lot in easy-to-reach places.  If you need to reach something above you, use a strong step stool that has a grab bar.  Keep electrical cords out of the way.  Do not use floor polish or wax that makes floors slippery. If you must use wax, use non-skid floor wax.  Do not have throw rugs and other things on the floor that can make you trip. What can I do with my stairs?  Do not leave any items on the stairs.  Make sure that there are handrails on both sides of the stairs and use them. Fix handrails that are broken or loose. Make sure that handrails are as long as the stairways.  Check any carpeting to make sure that it is firmly attached to the stairs. Fix any carpet that is loose or worn.  Avoid having throw rugs at the top or bottom of the stairs. If you do have throw rugs, attach them to the floor with carpet tape.  Make sure that you have a light switch at  the top of the stairs and the bottom of the stairs. If you do not have them, ask someone to add them for you. What else can I do to help prevent falls?  Wear shoes that:  Do not have high heels.  Have rubber bottoms.  Are comfortable and fit you well.  Are closed at the toe. Do not wear sandals.  If you use a stepladder:  Make sure that it is fully opened. Do not climb a closed stepladder.  Make sure that both sides of the stepladder are locked into place.  Ask someone to hold it for you, if possible.  Clearly mark and make sure that you can see:  Any grab bars or handrails.  First and last steps.  Where the edge of each step is.  Use tools that help you move around (mobility aids) if they are needed. These include:  Canes.  Walkers.  Scooters.  Crutches.  Turn on the lights when you go into a dark area. Replace any light bulbs as soon as they burn out.  Set up your furniture so you have a clear path. Avoid moving your furniture around.  If any of your floors are uneven, fix them.  If there are any pets around you, be aware of where they are.  Review your medicines with your doctor. Some medicines can make you feel dizzy. This can increase your chance of falling. Ask your doctor what other things that you can do to help prevent falls. This information is not intended to replace advice given to you by your health care provider. Make sure you discuss any questions you have with your health care provider. Document Released: 08/29/2009 Document Revised: 04/09/2016 Document Reviewed: 12/07/2014 Elsevier Interactive Patient Education  2017 [employer].

## 2018-12-08 NOTE — Patient Instructions (Signed)
Low-Purine Eating Plan A low-purine eating plan involves making food choices to limit your intake of purine. Purine is a kind of uric acid. Too much uric acid in your blood can cause certain conditions, such as gout and kidney stones. Eating a low-purine diet can help control these conditions. What are tips for following this plan? Reading food labels   Avoid foods with saturated or Trans fat.  Check the ingredient list of grains-based foods, such as bread and cereal, to make sure that they contain whole grains.  Check the ingredient list of sauces or soups to make sure they do not contain meat or fish.  When choosing soft drinks, check the ingredient list to make sure they do not contain high-fructose corn syrup. Shopping  Buy plenty of fresh fruits and vegetables.  Avoid buying canned or fresh fish.  Buy dairy products labeled as low-fat or nonfat.  Avoid buying premade or processed foods. These foods are often high in fat, salt (sodium), and added sugar. Cooking  Use olive oil instead of butter when cooking. Oils like olive oil, canola oil, and sunflower oil contain healthy fats. Meal planning  Learn which foods do or do not affect you. If you find out that a food tends to cause your gout symptoms to flare up, avoid eating that food. You can enjoy foods that do not cause problems. If you have any questions about a food item, talk with your dietitian or health care provider.  Limit foods high in fat, especially saturated fat. Fat makes it harder for your body to get rid of uric acid.  Choose foods that are lower in fat and are lean sources of protein. General guidelines  Limit alcohol intake to no more than 1 drink a day for nonpregnant women and 2 drinks a day for men. One drink equals 12 oz of beer, 5 oz of wine, or 1 oz of hard liquor. Alcohol can affect the way your body gets rid of uric acid.  Drink plenty of water to keep your urine clear or pale yellow. Fluids can help  remove uric acid from your body.  If directed by your health care provider, take a vitamin C supplement.  Work with your health care provider and dietitian to develop a plan to achieve or maintain a healthy weight. Losing weight can help reduce uric acid in your blood. What foods are recommended? The items listed may not be a complete list. Talk with your dietitian about what dietary choices are best for you. Foods low in purines Foods low in purines do not need to be limited. These include:  All fruits.  All low-purine vegetables, pickles, and olives.  Breads, pasta, rice, cornbread, and popcorn. Cake and other baked goods.  All dairy foods.  Eggs, nuts, and nut butters.  Spices and condiments, such as salt, herbs, and vinegar.  Plant oils, butter, and margarine.  Water, sugar-free soft drinks, tea, coffee, and cocoa.  Vegetable-based soups, broths, sauces, and gravies. Foods moderate in purines Foods moderate in purines should be limited to the amounts listed.   cup of asparagus, cauliflower, spinach, mushrooms, or green peas, each day.  2/3 cup uncooked oatmeal, each day.   cup dry wheat bran or wheat germ, each day.  2-3 ounces of meat or poultry, each day.  4-6 ounces of shellfish, such as crab, lobster, oysters, or shrimp, each day.  1 cup cooked beans, peas, or lentils, each day.  Soup, broths, or bouillon made from meat or   2-3 ounces of meat or poultry, each day.   4-6 ounces of shellfish, such as crab, lobster, oysters, or shrimp, each day.   1 cup cooked beans, peas, or lentils, each day.   Soup, broths, or bouillon made from meat or fish. Limit these foods as much as possible.  What foods are not recommended?  The items listed may not be a complete list. Talk with your dietitian about what dietary choices are best for you.  Limit your intake of foods high in purines, including:   Beer and other alcohol.   Meat-based gravy or sauce.   Canned or fresh fish, such as:  ? Anchovies, sardines, herring, and tuna.  ? Mussels and scallops.  ? Codfish, trout, and haddock.   Bacon.   Organ meats, such as:  ? Liver or kidney.  ? Tripe.  ? Sweetbreads (thymus gland or  pancreas).   Wild game or goose.   Yeast or yeast extract supplements.   Drinks sweetened with high-fructose corn syrup.  Summary   Eating a low-purine diet can help control conditions caused by too much uric acid in the body, such as gout or kidney stones.   Choose low-purine foods, limit alcohol, and limit foods high in fat.   You will learn over time which foods do or do not affect you. If you find out that a food tends to cause your gout symptoms to flare up, avoid eating that food.  This information is not intended to replace advice given to you by your health care provider. Make sure you discuss any questions you have with your health care provider.  Document Released: 02/27/2011 Document Revised: 12/16/2016 Document Reviewed: 12/16/2016  Elsevier Interactive Patient Education  2019 [employer]      Gout    Gout is painful swelling of your joints. Gout is a type of arthritis. It is caused by having too much uric acid in your body. Uric acid is a chemical that is made when your body breaks down substances called purines. If your body has too much uric acid, sharp crystals can form and build up in your joints. This causes pain and swelling.  Gout attacks can happen quickly and be very painful (acute gout). Over time, the attacks can affect more joints and happen more often (chronic gout).  What are the causes?   Too much uric acid in your blood. This can happen because:  ? Your kidneys do not remove enough uric acid from your blood.  ? Your body makes too much uric acid.  ? You eat too many foods that are high in purines. These foods include organ meats, some seafood, and beer.   Trauma or stress.  What increases the risk?   Having a family history of gout.   Being female and middle-aged.   Being female and having gone through menopause.   Being very overweight (obese).   Drinking alcohol, especially beer.   Not having enough water in the body (being dehydrated).   Losing weight too  quickly.   Having an organ transplant.   Having lead poisoning.   Taking certain medicines.   Having kidney disease.   Having a skin condition called psoriasis.  What are the signs or symptoms?  An attack of acute gout usually happens in just one joint. The most common place is the big toe. Attacks often start at night. Other joints that may be affected include joints of the feet, ankle, knee, fingers, wrist, or elbow. Symptoms   of an attack may include:   Very bad pain.   Warmth.   Swelling.   Stiffness.   Shiny, red, or purple skin.   Tenderness. The affected joint may be very painful to touch.   Chills and fever.  Chronic gout may cause symptoms more often. More joints may be involved. You may also have white or yellow lumps (tophi) on your hands or feet or in other areas near your joints.  How is this treated?   Treatment for this condition has two phases: treating an acute attack and preventing future attacks.   Acute gout treatment may include:  ? NSAIDs.  ? Steroids. These are taken by mouth or injected into a joint.  ? Colchicine. This medicine relieves pain and swelling. It can be given by mouth or through an IV tube.   Preventive treatment may include:  ? Taking small doses of NSAIDs or colchicine daily.  ? Using a medicine that reduces uric acid levels in your blood.  ? Making changes to your diet. You may need to see a food expert (dietitian) about what to eat and drink to prevent gout.  Follow these instructions at home:  During a gout attack     If told, put ice on the painful area:  ? Put ice in a plastic bag.  ? Place a towel between your skin and the bag.  ? Leave the ice on for 20 minutes, 2-3 times a day.   Raise (elevate) the painful joint above the level of your heart as often as you can.   Rest the joint as much as possible. If the joint is in your leg, you may be given crutches.   Follow instructions from your doctor about what you cannot eat or drink.  Avoiding future gout  attacks   Eat a low-purine diet. Avoid foods and drinks such as:  ? Liver.  ? Kidney.  ? Anchovies.  ? Asparagus.  ? Herring.  ? Mushrooms.  ? Mussels.  ? Beer.   Stay at a healthy weight. If you want to lose weight, talk with your doctor. Do not lose weight too fast.   Start or continue an exercise plan as told by your doctor.  Eating and drinking   Drink enough fluids to keep your pee (urine) pale yellow.   If you drink alcohol:  ? Limit how much you use to:   0-1 drink a day for women.   0-2 drinks a day for men.  ? Be aware of how much alcohol is in your drink. In the [place] one drink equals one 12 oz bottle of beer (355 mL), one 5 oz glass of wine (148 mL), or one 1 oz glass of hard liquor (44 mL).  General instructions   Take over-the-counter and prescription medicines only as told by your doctor.   Do not drive or use heavy machinery while taking prescription pain medicine.   Return to your normal activities as told by your doctor. Ask your doctor what activities are safe for you.   Keep all follow-up visits as told by your doctor. This is important.  Contact a doctor if:   You have another gout attack.   You still have symptoms of a gout attack after 10 days of treatment.   You have problems (side effects) because of your medicines.   You have chills or a fever.   You have burning pain when you pee (urinate).   You have pain in your

## 2018-12-08 NOTE — Progress Notes (Signed)
Subjective:   Michele Page is a 63 y.o. female who presents for Medicare Annual (Subsequent) preventive examination.  Review of Systems:   Cardiac Risk Factors include: advanced age (>35men, >79 women);family history of premature cardiovascular disease;hypertension;obesity (BMI >30kg/m2);dyslipidemia     Objective:     Vitals: BP 114/70   Pulse 70   Temp 98 F (36.7 C) (Oral)   Ht 5\' 2"  (1.575 m)   Wt 197 lb 3.2 oz (89.4 kg)   SpO2 96%   BMI 36.07 kg/m   Body mass index is 36.07 kg/m.  Advanced Directives 12/08/2018 11/26/2018 10/26/2018 01/25/2018 11/24/2017 05/21/2017 04/22/2017  Does Patient Have a Medical Advance Directive? No No No No No No No  Would patient like information on creating a medical advance directive? Yes (MAU/Ambulatory/Procedural Areas - Information given) No - Patient declined - - No - Patient declined No - Patient declined -    Tobacco Social History   Tobacco Use  Smoking Status Former Smoker  . Packs/day: 0.50  . Years: 25.00  . Pack years: 12.50  . Types: Cigarettes  . Last attempt to quit: 11/16/2004  . Years since quitting: 14.0  Smokeless Tobacco Never Used     Counseling given: Not Answered   Clinical Intake:  Pre-visit preparation completed: Yes  Pain : No/denies pain     Nutritional Status: BMI > 30  Obese Diabetes: No  How often do you need to have someone help you when you read instructions, pamphlets, or other written materials from your doctor or pharmacy?: 1 - Never What is the last grade level you completed in school?: 4 years of college  Interpreter Needed?: No     Past Medical History:  Diagnosis Date  . Arthritis    lt knee  . Breast cancer ([facility])   . Breast cancer of upper-outer quadrant of left female breast ([facility]) 04/02/2016  . Colon polyps   . COPD (chronic obstructive pulmonary disease) ([facility])   . Hyperlipidemia   . Hypertension   . Multiple sclerosis ([place])   . Personal history of chemotherapy   . Stroke ([facility] [facility])      2013, mild cognitive deficits  . Vision abnormalities    Past Surgical History:  Procedure Laterality Date  . BREAST BIOPSY Left 03/31/2016  . BREAST LUMPECTOMY Left 04/17/2016  . HYSTEROSCOPY W/D&C N/A 12/19/2015   Procedure: DILATATION AND CURETTAGE /HYSTEROSCOPY;  Surgeon: Linda Hedges, DO;  Location: [facility];  Service: Gynecology;  Laterality: N/A;  . RADIOACTIVE SEED GUIDED PARTIAL MASTECTOMY WITH AXILLARY SENTINEL LYMPH NODE BIOPSY Left 04/17/2016   Procedure: RADIOACTIVE SEED GUIDED PARTIAL MASTECTOMY WITH AXILLARY SENTINEL LYMPH NODE BIOPSY;  Surgeon: Erroll Luna, MD;  Location: [facility];  Service: General;  Laterality: Left;  RADIOACTIVE SEED GUIDED PARTIAL MASTECTOMY WITH AXILLARY SENTINEL LYMPH NODE BIOPSY   . TUBAL LIGATION    . VAGINAL DELIVERY     x3   Family History  Problem Relation Age of Onset  . Stroke Sister   . Hypertension Mother   . Stroke Mother   . Alcohol abuse Brother   . HIV/AIDS Brother    Social History   Socioeconomic History  . Marital status: Married    Spouse name: Not on file  . Number of children: 3  . Years of education: Not on file  . Highest education level: Not on file  Occupational History  . Occupation: retired  Scientific laboratory technician  . Financial resource strain: Not hard at all  . Food  insecurity:    Worry: Never true    Inability: Never true  . Transportation needs:    Medical: No    Non-medical: No  Tobacco Use  . Smoking status: Former Smoker    Packs/day: 0.50    Years: 25.00    Pack years: 12.50    Types: Cigarettes    Last attempt to quit: 11/16/2004    Years since quitting: 14.0  . Smokeless tobacco: Never Used  Substance and Sexual Activity  . Alcohol use: Yes    Alcohol/week: 0.0 standard drinks    Comment: occas  . Drug use: No  . Sexual activity: Yes    Comment: 1st intercourse 13 yo-5 partners  Lifestyle  . Physical activity:    Days per week: 1 day    Minutes per session: 90 min  . Stress: Only  a little  Relationships  . Social connections:    Talks on phone: Twice a week    Gets together: Once a week    Attends religious service: More than 4 times per year    Active member of club or organization: No    Attends meetings of clubs or organizations: Never    Relationship status: Married  Other Topics Concern  . Not on file  Social History Narrative   Diet- N/A   Caffeine- Yes   Married- Yes   House- 2 story with 2 people   Pets- No   Current/past profession- Insurance underwriter daycare   Exercise- Yes   Living will-No   DNR-N/A   POA/HPOA-No       Outpatient Encounter Medications as of 12/08/2018  Medication Sig  . anastrozole (ARIMIDEX) 1 MG tablet Take 1 tablet (1 mg total) by mouth daily.  Marland Kitchen aspirin EC 81 MG tablet Take 1 tablet (81 mg total) by mouth daily.  Marland Kitchen labetalol (NORMODYNE) 300 MG tablet Take 2 tablets (600 mg total) by mouth 3 (three) times daily. Please keep upcoming for further refills, thanks!  Marland Kitchen NIFEdipine (ADALAT CC) 60 MG 24 hr tablet TAKE 1 TABLET BY MOUTH TWICE DAILY  . pravastatin (PRAVACHOL) 20 MG tablet Take 1 tablet (20 mg total) by mouth at bedtime.  . traZODone (DESYREL) 50 MG tablet Take 0.5-1 tablets (25-50 mg total) by mouth at bedtime as needed for sleep.   No facility-administered encounter medications on file as of 12/08/2018.     Activities of Daily Living In your present state of health, do you have any difficulty performing the following activities: 12/08/2018  Hearing? N  Vision? N  Difficulty concentrating or making decisions? Y  Comment memory deficits after CVA  Walking or climbing stairs? N  Dressing or bathing? N  Doing errands, shopping? N  Preparing Food and eating ? N  Using the Toilet? N  In the past six months, have you accidently leaked urine? N  Do you have problems with loss of bowel control? N  Managing your Medications? N  Managing your Finances? N  Housekeeping or managing your Housekeeping? N  Some recent data  might be hidden    Patient Care Team: [name], NP as PCP - General (Geriatric Medicine) Nicholas Lose, MD as Consulting Physician (Oncology) Erroll Luna, MD as Consulting Physician (General Surgery) Thea Silversmith, MD as Consulting Physician (Radiation Oncology)    Assessment:   This is a routine wellness examination for Nguyen.  Exercise Activities and Dietary recommendations Current Exercise Habits: Home exercise routine, Type of exercise: treadmill;Other - see comments(stationary bike), Time (Minutes):  60, Frequency (Times/Week): 3, Weekly Exercise (Minutes/Week): 180, Intensity: Moderate  Goals    . Increase water intake     Starting 12/11/16, I will attempt to increase my water intake from 2 glasses to 5 glasses per day.     . Weight (lb) < 150 lb (68 kg)     Would like to lose 50 lbs, by increasing exercise and modification to diet.        Fall Risk Fall Risk  12/08/2018 12/08/2018 10/26/2018 07/29/2018 05/24/2018  Falls in the past year? 0 0 0 No No  Number falls in past yr: 0 0 - - -  Injury with Fall? 0 0 - - -   Is the patient's home free of loose throw rugs in walkways, pet beds, electrical cords, etc?   no      Grab bars in the bathroom? no      Handrails on the stairs?   yes      Adequate lighting?   yes  Timed Get Up and Go performed: n/a  Depression Screen PHQ 2/9 Scores 12/08/2018 12/08/2018 07/29/2018 01/14/2018  PHQ - 2 Score 0 0 0 0     Cognitive Function MMSE - Mini Mental State Exam 12/11/2016  Orientation to time 5  Orientation to Place 4  Registration 3  Attention/ Calculation 5  Recall 2  Language- name 2 objects 2  Language- repeat 1  Language- follow 3 step command 2  Language- read & follow direction 1  Write a sentence 1  Copy design 0  Total score 26        Immunization History  Administered Date(s) Administered  . Influenza,inj,Quad PF,6+ Mos 08/07/2015, 08/06/2016, 11/24/2017, 10/26/2018  . PPD Test 07/13/2017     Qualifies for Shingles Vaccine? Yes but does not want at this time.   Screening Tests Health Maintenance  Topic Date Due  . Hepatitis C Screening  [birth year]  . HIV Screening  [birth year]1970  . COLONOSCOPY  07/07/2018  . TETANUS/TDAP  11/17/2019 (Originally 08/03/1974)  . MAMMOGRAM  12/05/2020  . PAP SMEAR-Modifier  05/02/2021  . INFLUENZA VACCINE  Completed    Cancer Screenings: Lung: Low Dose CT Chest recommended if Age 67-80 years, 30 pack-year currently smoking OR have quit w/in 15years. Patient does not qualify. Breast:  Up to date on Mammogram? Yes   Up to date of Bone Density/Dexa? No Colorectal: currently due, last colonoscopy 2016, recommended every 3 years  Additional Screenings: Hepatitis C Screening: declines hep C and HIV screening. Reports she plans to get bone density through GYN      Plan:     I have personally reviewed and noted the following in the patient's chart:   . Medical and social history . Use of alcohol, tobacco or illicit drugs  . Current medications and supplements . Functional ability and status . Nutritional status . Physical activity . Advanced directives . List of other physicians . Hospitalizations, surgeries, and ER visits in previous 12 months . Vitals . Screenings to include cognitive, depression, and falls . Referrals and appointments  In addition, I have reviewed and discussed with patient certain preventive protocols, quality metrics, and best practice recommendations. A written personalized care plan for preventive services as well as general preventive health recommendations were provided to patient.     [name], NP  12/08/2018

## 2018-12-08 NOTE — Progress Notes (Signed)
  Careteam: Patient Care Team: ,  K, NP as PCP - General (Geriatric Medicine) Gudena, Vinay, MD as Consulting Physician (Oncology) [name], Thomas, MD as Consulting Physician (General Surgery) Wentworth, Stacy, MD as Consulting Physician (Radiation Oncology)  Advanced Directive information    No Known Allergies  Chief Complaint  Patient presents with  . Follow-up    2 week follow-up on wrist pain and sleep concerns. Check Uric Acid   . Hospitalization Follow-up    ER follow-up on URI, all symptoms resolved      HPI: Patient is a 63 y.o. female seen in the office today to follow up wrist pain. She was seen 2 weeks ago due to pain in her wrist- Xray was negative. Uric acid was noted to be elevated. She was treated with prednisone. Wrist pain is better. Continues to be slightly sore but overall "fine"  Went to the ED for URI, reports she gets a viral illness every January but this year it was worse than normal and took a long time to get better however she is feeling much better today.  It was noted that her potassium was down and she was given a replacement.  No fevers, shortness of breath or fevers.    Review of Systems:  Review of Systems  Constitutional: Negative for chills and fever.  Respiratory: Negative for sputum production.   Cardiovascular: Negative for leg swelling.  Gastrointestinal: Negative for abdominal pain, constipation and diarrhea.  Musculoskeletal: Negative for joint pain.    Past Medical History:  Diagnosis Date  . Arthritis    lt knee  . Breast cancer ([name])   . Breast cancer of 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) 04/02/2016  . Colon polyps   . COPD (chronic obstructive pulmonary disease) ([name])   . Hyperlipidemia   . Hypertension   . Multiple sclerosis ([name])   . Personal history of chemotherapy   . Stroke ([name])    2013, mild cognitive deficits  . Vision abnormalities    Past Surgical History:  Procedure Laterality  Date  . BREAST BIOPSY Left 03/31/2016  . BREAST LUMPECTOMY Left 04/17/2016  . HYSTEROSCOPY W/D&C N/A 12/19/2015   Procedure: DILATATION AND CURETTAGE /HYSTEROSCOPY;  Surgeon: Megan Morris, DO;  Location: [facility];  Service: Gynecology;  Laterality: N/A;  . RADIOACTIVE SEED GUIDED PARTIAL MASTECTOMY WITH AXILLARY SENTINEL LYMPH NODE BIOPSY Left 04/17/2016   Procedure: RADIOACTIVE SEED GUIDED PARTIAL MASTECTOMY WITH AXILLARY SENTINEL LYMPH NODE BIOPSY;  Surgeon: Thomas [name], MD;  Location: [facility];  Service: General;  Laterality: Left;  RADIOACTIVE SEED GUIDED PARTIAL MASTECTOMY WITH AXILLARY SENTINEL LYMPH NODE BIOPSY   . TUBAL LIGATION    . VAGINAL DELIVERY     x3   Social History:   reports that she quit smoking about 14 years ago. Her smoking use included cigarettes. She has a 12.50 pack-year smoking history. She has never used smokeless tobacco. She reports current alcohol use. She reports that she does not use drugs.  Family History  Problem Relation Age of Onset  . Stroke Sister   . Hypertension Mother   . Stroke Mother   . Alcohol abuse Brother   . HIV/AIDS Brother     Medications: Patient's Medications  New Prescriptions   No medications on file  Previous Medications   ANASTROZOLE (ARIMIDEX) 1 MG TABLET    Take 1 tablet (1 mg total) by mouth daily.   ASPIRIN EC 81 MG TABLET    Take 1 tablet (81 mg   total) by mouth daily.   LABETALOL (NORMODYNE) 300 MG TABLET    Take 2 tablets (600 mg total) by mouth 3 (three) times daily. Please keep upcoming for further refills, thanks!   NIFEDIPINE (ADALAT CC) 60 MG 24 HR TABLET    TAKE 1 TABLET BY MOUTH TWICE DAILY   PRAVASTATIN (PRAVACHOL) 20 MG TABLET    Take 1 tablet (20 mg total) by mouth at bedtime.   TRAZODONE (DESYREL) 50 MG TABLET    Take 0.5-1 tablets (25-50 mg total) by mouth at bedtime as needed for sleep.  Modified Medications   No medications on file  Discontinued Medications   PREDNISONE (DELTASONE) 20 MG  TABLET    Take 1 tablet (20 mg total) by mouth 2 (two) times daily with a meal.     Physical Exam:  Vitals:   12/08/18 1305  BP: 114/70  Pulse: 70  Resp: 10  Temp: 98 F (36.7 C)  TempSrc: Oral  SpO2: 96%  Weight: 197 lb (89.4 kg)  Height: 5' 2" (1.575 m)   Body mass index is 36.03 kg/m.  Physical Exam Constitutional:      Appearance: Normal appearance. She is well-developed.  Neck:     Musculoskeletal: Neck supple.     Thyroid: No thyromegaly.     Vascular: No carotid bruit.     Trachea: No tracheal deviation.  Cardiovascular:     Rate and Rhythm: Normal rate and regular rhythm.     Heart sounds: Murmur (1/6 SEM) present. No friction rub. No gallop.      Comments: No LE edema b/l. no calf TTP.  Pulmonary:     Effort: Pulmonary effort is normal. No respiratory distress.     Breath sounds: Normal breath sounds. No stridor.  Abdominal:     Palpations: Abdomen is not rigid.  Musculoskeletal:        General: No tenderness.       Hands:  Lymphadenopathy:     Cervical: No cervical adenopathy.  Skin:    General: Skin is warm and dry.     Findings: No bruising, erythema, lesion or rash.  Neurological:     Mental Status: She is alert and oriented to person, place, and time.     Deep Tendon Reflexes: Reflexes are normal and symmetric.  Psychiatric:        Speech: Speech is slurred.        Behavior: Behavior normal.        Thought Content: Thought content normal.        Judgment: Judgment normal.     Comments: Expressive aphasia     Labs reviewed: Basic Metabolic Panel: Recent Labs    06/24/18 1045 07/25/18 1631 10/24/18 1015 11/26/18 1320  NA 143 140 139 139  K 3.9 3.4* 4.1 3.2*  CL 107 106 103 104  CO2 24 23 29 25  GLUCOSE 106* 104* 91 99  BUN 17 17 17 14  CREATININE 1.53* 1.55* 1.21* 1.38*  CALCIUM 9.5 9.2 9.7 9.3  TSH 1.70  --   --   --    Liver Function Tests: Recent Labs    06/24/18 1045 10/24/18 1015  AST 19  --   ALT 17 29  BILITOT 0.5   --   PROT 6.8  --    No results for input(s): LIPASE, AMYLASE in the last 8760 hours. No results for input(s): AMMONIA in the last 8760 hours. CBC: Recent Labs    06/24/18 1045 07/25/18 1631 11/22/18 1557   11/26/18 1320  WBC 4.4 4.7 5.1 8.7  NEUTROABS 2,882  --  2,892 5.6  HGB 11.7 12.6 12.5 12.2  HCT 34.7* 38.0 36.3 37.3  MCV 92.8 94.8 90.8 92.6  PLT 293 308 324 280   Lipid Panel: Recent Labs    06/24/18 1045 10/24/18 1015  CHOL 205* 176  HDL 65 65  LDLCALC 121* 94  TRIG 88 84  CHOLHDL 3.2 2.7   TSH: Recent Labs    06/24/18 1045  TSH 1.70   A1C: No results found for: HGBA1C   Assessment/Plan 1. Acute gout of left wrist, unspecified cause -improved pain with prednisone. Information provided on low purine diet and gout, will follow up uric acid level today may need to add allopurinol to prevent future flares.  - Uric acid  2. Hypokalemia Noted during ED visit - BMP with eGFR([facility]) to follow up  3. URI Has resolved.   Next appt: 4 months for routine follow up. Carlos American. [place], [facility] [phone]

## 2018-12-09 ENCOUNTER — Telehealth: Payer: Self-pay

## 2018-12-09 MED ORDER — ALLOPURINOL 100 MG PO TABS: 100.0000 mg | ORAL_TABLET | Freq: Every day | ORAL | 11 refills | Status: DC

## 2018-12-09 NOTE — Telephone Encounter (Signed)
Discussed results with patient, patient verbalized understanding of results  RX sent to pharmacy. Patient was hesitant about taking medication and questioned how long she will be on medication. Patient will research medication and cal if she decides not to take.  Copy of labs mailed

## 2018-12-09 NOTE — Telephone Encounter (Signed)
-----   Message from [name], NP sent at 12/09/2018  7:21 AM EST ----- Uric acid remains elevated, lets have her start allopurinol 100 mg by mouth daily to prevent recurrent flares. Potassium level now normal

## 2018-12-14 ENCOUNTER — Ambulatory Visit
Admission: RE | Admit: 2018-12-14 | Discharge: 2018-12-14 | Disposition: A | Discharge status: Home / Self Care | Payer: Medicare HMO | Source: Ambulatory Visit | Attending: Neurology | Admitting: Neurology

## 2018-12-14 DIAGNOSIS — G35 Multiple sclerosis: Secondary | ICD-10-CM

## 2018-12-16 ENCOUNTER — Telehealth: Payer: Self-pay | Admitting: *Deleted

## 2018-12-16 NOTE — Telephone Encounter (Signed)
-----   Message from Britt Bottom, MD sent at 12/15/2018  6:12 PM EST ----- Please let her know that the MRI looks unchanged compared to the 2018 MRI.

## 2018-12-16 NOTE — Telephone Encounter (Signed)
Pt called back and was informed of her MRI results.

## 2018-12-16 NOTE — Telephone Encounter (Signed)
Called and LVM for pt to call about results.   Ok to inform her of Dr. Felecia Shelling message. Thank you

## 2018-12-16 NOTE — Telephone Encounter (Signed)
Noted, thank you

## 2019-02-20 ENCOUNTER — Other Ambulatory Visit: Payer: Self-pay | Admitting: Cardiovascular Disease

## 2019-02-20 DIAGNOSIS — I10 Essential (primary) hypertension: Secondary | ICD-10-CM

## 2019-02-27 ENCOUNTER — Other Ambulatory Visit: Payer: Self-pay | Admitting: *Deleted

## 2019-02-27 MED ORDER — NIFEDIPINE ER 60 MG PO TB24: 60.0000 mg | ORAL_TABLET | Freq: Two times a day (BID) | ORAL | 0 refills | Status: DC

## 2019-03-12 ENCOUNTER — Encounter: Payer: Self-pay | Admitting: Nurse Practitioner

## 2019-03-21 ENCOUNTER — Encounter: Payer: Self-pay | Admitting: Nurse Practitioner

## 2019-03-21 ENCOUNTER — Ambulatory Visit (INDEPENDENT_AMBULATORY_CARE_PROVIDER_SITE_OTHER): Payer: Medicare HMO | Admitting: Nurse Practitioner

## 2019-03-21 ENCOUNTER — Other Ambulatory Visit: Payer: Self-pay

## 2019-03-21 DIAGNOSIS — E7800 Pure hypercholesterolemia, unspecified: Secondary | ICD-10-CM

## 2019-03-21 DIAGNOSIS — G4700 Insomnia, unspecified: Secondary | ICD-10-CM | POA: Diagnosis not present

## 2019-03-21 DIAGNOSIS — M109 Gout, unspecified: Secondary | ICD-10-CM

## 2019-03-21 MED ORDER — ATORVASTATIN CALCIUM 20 MG PO TABS: 20.0000 mg | ORAL_TABLET | Freq: Every day | ORAL | 1 refills | Status: DC

## 2019-03-21 MED ORDER — ESZOPICLONE 2 MG PO TABS: 2.0000 mg | ORAL_TABLET | Freq: Every evening | ORAL | 1 refills | Status: DC | PRN

## 2019-03-21 NOTE — Patient Instructions (Addendum)
To stop pravastatin and start Lipitor 20 mg daily To follow low purine diet to reduce uric acid level to prevent gout flare Lunesta 2 mg sent to pharmacy, to use AS NEEDED for sleep.   Insomnia Insomnia is a sleep disorder that makes it difficult to fall asleep or stay asleep. Insomnia can cause fatigue, low energy, difficulty concentrating, mood swings, and poor performance at work or school. There are three different ways to classify insomnia:  Difficulty falling asleep.  Difficulty staying asleep.  Waking up too early in the morning. Any type of insomnia can be long-term (chronic) or short-term (acute). Both are common. Short-term insomnia usually lasts for three months or less. Chronic insomnia occurs at least three times a week for longer than three months. What are the causes? Insomnia may be caused by another condition, situation, or substance, such as:  Anxiety.  Certain medicines.  Gastroesophageal reflux disease (GERD) or other gastrointestinal conditions.  Asthma or other breathing conditions.  Restless legs syndrome, sleep apnea, or other sleep disorders.  Chronic pain.  Menopause.  Stroke.  Abuse of alcohol, tobacco, or illegal drugs.  Mental health conditions, such as depression.  Caffeine.  Neurological disorders, such as Alzheimer's disease.  An overactive thyroid (hyperthyroidism). Sometimes, the cause of insomnia may not be known. What increases the risk? Risk factors for insomnia include:  Gender. Women are affected more often than men.  Age. Insomnia is more common as you get older.  Stress.  Lack of exercise.  Irregular work schedule or working night shifts.  Traveling between different time zones.  Certain medical and mental health conditions. What are the signs or symptoms? If you have insomnia, the main symptom is having trouble falling asleep or having trouble staying asleep. This may lead to other symptoms, such as:  Feeling  fatigued or having low energy.  Feeling nervous about going to sleep.  Not feeling rested in the morning.  Having trouble concentrating.  Feeling irritable, anxious, or depressed. How is this diagnosed? This condition may be diagnosed based on:  Your symptoms and medical history. Your health care provider may ask about: ? Your sleep habits. ? Any medical conditions you have. ? Your mental health.  A physical exam. How is this treated? Treatment for insomnia depends on the cause. Treatment may focus on treating an underlying condition that is causing insomnia. Treatment may also include:  Medicines to help you sleep.  Counseling or therapy.  Lifestyle adjustments to help you sleep better. Follow these instructions at home: Eating and drinking   Limit or avoid alcohol, caffeinated beverages, and cigarettes, especially close to bedtime. These can disrupt your sleep.  Do not eat a large meal or eat spicy foods right before bedtime. This can lead to digestive discomfort that can make it hard for you to sleep. Sleep habits   Keep a sleep diary to help you and your health care provider figure out what could be causing your insomnia. Write down: ? When you sleep. ? When you wake up during the night. ? How well you sleep. ? How rested you feel the next day. ? Any side effects of medicines you are taking. ? What you eat and drink.  Make your bedroom a dark, comfortable place where it is easy to fall asleep. ? Put up shades or blackout curtains to block light from outside. ? Use a white noise machine to block noise. ? Keep the temperature cool.  Limit screen use before bedtime. This includes: ? Watching  TV. ? Using your smartphone, tablet, or computer.  Stick to a routine that includes going to bed and waking up at the same times every day and night. This can help you fall asleep faster. Consider making a quiet activity, such as reading, part of your nighttime routine.   Try to avoid taking naps during the day so that you sleep better at night.  Get out of bed if you are still awake after 15 minutes of trying to sleep. Keep the lights down, but try reading or doing a quiet activity. When you feel sleepy, go back to bed. General instructions  Take over-the-counter and prescription medicines only as told by your health care provider.  Exercise regularly, as told by your health care provider. Avoid exercise starting several hours before bedtime.  Use relaxation techniques to manage stress. Ask your health care provider to suggest some techniques that may work well for you. These may include: ? Breathing exercises. ? Routines to release muscle tension. ? Visualizing peaceful scenes.  Make sure that you drive carefully. Avoid driving if you feel very sleepy.  Keep all follow-up visits as told by your health care provider. This is important. Contact a health care provider if:  You are tired throughout the day.  You have trouble in your daily routine due to sleepiness.  You continue to have sleep problems, or your sleep problems get worse. Get help right away if:  You have serious thoughts about hurting yourself or someone else. If you ever feel like you may hurt yourself or others, or have thoughts about taking your own life, get help right away. You can go to your nearest emergency department or call:  Your local emergency services (911 in the [place]  A suicide crisis helpline, such as the [facility] at 475-013-7632. This is open 24 hours a day. Summary  Insomnia is a sleep disorder that makes it difficult to fall asleep or stay asleep.  Insomnia can be long-term (chronic) or short-term (acute).  Treatment for insomnia depends on the cause. Treatment may focus on treating an underlying condition that is causing insomnia.  Keep a sleep diary to help you and your health care provider figure out what could be causing your  insomnia. This information is not intended to replace advice given to you by your health care provider. Make sure you discuss any questions you have with your health care provider. Document Released: 10/30/2000 Document Revised: 08/12/2017 Document Reviewed: 08/12/2017 Elsevier Interactive Patient Education  2019 [place] A low-purine eating plan involves making food choices to limit your intake of purine. Purine is a kind of uric acid. Too much uric acid in your blood can cause certain conditions, such as gout and kidney stones. Eating a low-purine diet can help control these conditions. What are tips for following this plan? Reading food labels   Avoid foods with saturated or Trans fat.  Check the ingredient list of grains-based foods, such as bread and cereal, to make sure that they contain whole grains.  Check the ingredient list of sauces or soups to make sure they do not contain meat or fish.  When choosing soft drinks, check the ingredient list to make sure they do not contain high-fructose corn syrup. Shopping  Buy plenty of fresh fruits and vegetables.  Avoid buying canned or fresh fish.  Buy dairy products labeled as low-fat or nonfat.  Avoid buying premade or processed foods. These foods are often high  in fat, salt (sodium), and added sugar. Cooking  Use olive oil instead of butter when cooking. Oils like olive oil, canola oil, and sunflower oil contain healthy fats. Meal planning  Learn which foods do or do not affect you. If you find out that a food tends to cause your gout symptoms to flare up, avoid eating that food. You can enjoy foods that do not cause problems. If you have any questions about a food item, talk with your dietitian or health care provider.  Limit foods high in fat, especially saturated fat. Fat makes it harder for your body to get rid of uric acid.  Choose foods that are lower in fat and are lean sources of protein.  General guidelines  Limit alcohol intake to no more than 1 drink a day for nonpregnant women and 2 drinks a day for men. One drink equals 12 oz of beer, 5 oz of wine, or 1 oz of hard liquor. Alcohol can affect the way your body gets rid of uric acid.  Drink plenty of water to keep your urine clear or pale yellow. Fluids can help remove uric acid from your body.  If directed by your health care provider, take a vitamin C supplement.  Work with your health care provider and dietitian to develop a plan to achieve or maintain a healthy weight. Losing weight can help reduce uric acid in your blood. What foods are recommended? The items listed may not be a complete list. Talk with your dietitian about what dietary choices are best for you. Foods low in purines Foods low in purines do not need to be limited. These include:  All fruits.  All low-purine vegetables, pickles, and olives.  Breads, pasta, rice, cornbread, and popcorn. Cake and other baked goods.  All dairy foods.  Eggs, nuts, and nut butters.  Spices and condiments, such as salt, herbs, and vinegar.  Plant oils, butter, and margarine.  Water, sugar-free soft drinks, tea, coffee, and cocoa.  Vegetable-based soups, broths, sauces, and gravies. Foods moderate in purines Foods moderate in purines should be limited to the amounts listed.   cup of asparagus, cauliflower, spinach, mushrooms, or green peas, each day.  2/3 cup uncooked oatmeal, each day.   cup dry wheat bran or wheat germ, each day.  2-3 ounces of meat or poultry, each day.  4-6 ounces of shellfish, such as crab, lobster, oysters, or shrimp, each day.  1 cup cooked beans, peas, or lentils, each day.  Soup, broths, or bouillon made from meat or fish. Limit these foods as much as possible. What foods are not recommended? The items listed may not be a complete list. Talk with your dietitian about what dietary choices are best for you. Limit your intake of  foods high in purines, including:  Beer and other alcohol.  Meat-based gravy or sauce.  Canned or fresh fish, such as: ? Anchovies, sardines, herring, and tuna. ? Mussels and scallops. ? Codfish, trout, and haddock.  Berniece Salines.  Organ meats, such as: ? Liver or kidney. ? Tripe. ? Sweetbreads (thymus gland or pancreas).  [employer] biochemist.  Yeast or yeast extract supplements.  Drinks sweetened with high-fructose corn syrup. Summary  Eating a low-purine diet can help control conditions caused by too much uric acid in the body, such as gout or kidney stones.  Choose low-purine foods, limit alcohol, and limit foods high in fat.  You will learn over time which foods do or do not affect you. If  you find out that a food tends to cause your gout symptoms to flare up, avoid eating that food. This information is not intended to replace advice given to you by your health care provider. Make sure you discuss any questions you have with your health care provider. Document Released: 02/27/2011 Document Revised: 12/16/2016 Document Reviewed: 12/16/2016 Elsevier Interactive Patient Education  2019 [employer].

## 2019-03-21 NOTE — Progress Notes (Signed)
This service is provided via telemedicine  No vital signs collected/recorded due to the encounter was a telemedicine visit.   Location of patient (ex: home, work): Home  Patient consents to a telephone visit:  Yes  Location of the provider (ex: office, home):  [facility], Office   Name of any referring provider:  N/A  Names of all persons participating in the telemedicine service and their role in the encounter:  S.Chrae B/CMA, [name], NP, and Patient    Time spent on call: 7 min with medical assistant      Careteam: Patient Care Team: [name], NP as PCP - General (Geriatric Medicine) Nicholas Lose, MD as Consulting Physician (Oncology) Erroll Luna, MD as Consulting Physician (General Surgery) Thea Silversmith, MD as Consulting Physician (Radiation Oncology)  Advanced Directive information    No Known Allergies  Chief Complaint  Patient presents with  . Acute Visit    Sleep Issues, Trazodone not working. Telephone and Virtual Visit.   . Medication Management    Patient not taking gout prevenative, patient states she is w/o symptoms.   . Medication Management    Discuss pravachol, patient thinks medication is taking her hair out. Cardiologist increased medication.      HPI: Patient is a 63 y.o. female for follow up on sleep.  Reports that the trazodone is not working, reports took trazodone 50 mg by mouth at bedtime for sleep and did not work.  Does not have a problem falling asleep but will sleep for about 1 hour and then wake up. Has not tried any other medications.  Reports Dr Eulas Post had her on another medication that was effective.  She was on lunesta 2 mg which was very effective and not having side effects. Slept well, woke up without feeling groggy or "hungover." she stopped taking it because sleep improved but now back to having issues.  Stays up the whole night which is hard because she works.  She has tried melatonin  and not effective- "did nothing" Feels like she has tired about 3 other medications as well that has not helped. No increase in anxiety or depression, just can not sleep.   Gout- no longer having pain or flares, never took allopurinol.   Hyperlipidemia-taking Pravachol-- feels like the Pravachol is making her hair come out.  Stopped taking Pravachol for 3 months (about 1 year ago) and her hair loss stopped. She restarted medication but does not wish to continue it.   Review of Systems:  Review of Systems  Constitutional: Negative for chills and fever.  Respiratory: Negative for sputum production.   Cardiovascular: Negative for leg swelling.  Gastrointestinal: Negative for abdominal pain, constipation and diarrhea.  Musculoskeletal: Negative for joint pain.  Skin: Negative for itching and rash.  Psychiatric/Behavioral: Negative for depression. The patient has insomnia. The patient is not nervous/anxious.     Past Medical History:  Diagnosis Date  . Arthritis    lt knee  . Breast cancer ([place])   . Breast cancer of upper-outer quadrant of left female breast ([place]) 04/02/2016  . Colon polyps   . COPD (chronic obstructive pulmonary disease) ([place])   . Hyperlipidemia   . Hypertension   . Multiple sclerosis ([place])   . Personal history of chemotherapy   . Stroke ([place])    2013, mild cognitive deficits  . Vision abnormalities    Past Surgical History:  Procedure Laterality Date  . BREAST BIOPSY Left 03/31/2016  . BREAST LUMPECTOMY Left  04/17/2016  . HYSTEROSCOPY W/D&C N/A 12/19/2015   Procedure: DILATATION AND CURETTAGE /HYSTEROSCOPY;  Surgeon: Linda Hedges, DO;  Location: [facility];  Service: Gynecology;  Laterality: N/A;  . RADIOACTIVE SEED GUIDED PARTIAL MASTECTOMY WITH AXILLARY SENTINEL LYMPH NODE BIOPSY Left 04/17/2016   Procedure: RADIOACTIVE SEED GUIDED PARTIAL MASTECTOMY WITH AXILLARY SENTINEL LYMPH NODE BIOPSY;  Surgeon: Erroll Luna, MD;  Location: [facility];  Service:  General;  Laterality: Left;  RADIOACTIVE SEED GUIDED PARTIAL MASTECTOMY WITH AXILLARY SENTINEL LYMPH NODE BIOPSY   . TUBAL LIGATION    . VAGINAL DELIVERY     x3   Social History:   reports that she quit smoking about 14 years ago. Her smoking use included cigarettes. She has a 12.50 pack-year smoking history. She has never used smokeless tobacco. She reports current alcohol use. She reports that she does not use drugs.  Family History  Problem Relation Age of Onset  . Stroke Sister   . Hypertension Mother   . Stroke Mother   . Alcohol abuse Brother   . HIV/AIDS Brother     Medications: Patient's Medications  New Prescriptions   No medications on file  Previous Medications   ANASTROZOLE (ARIMIDEX) 1 MG TABLET    Take 1 tablet (1 mg total) by mouth daily.   ASPIRIN EC 81 MG TABLET    Take 1 tablet (81 mg total) by mouth daily.   LABETALOL (NORMODYNE) 300 MG TABLET    TAKE 2 TABLETS BY MOUTH THREE TIMES DAILY   NIFEDIPINE (ADALAT CC) 60 MG 24 HR TABLET    Take 1 tablet (60 mg total) by mouth 2 (two) times daily.   PRAVASTATIN (PRAVACHOL) 20 MG TABLET    Take 1 tablet (20 mg total) by mouth at bedtime.   TRAZODONE (DESYREL) 50 MG TABLET    Take 0.5-1 tablets (25-50 mg total) by mouth at bedtime as needed for sleep.  Modified Medications   No medications on file  Discontinued Medications   ALLOPURINOL (ZYLOPRIM) 100 MG TABLET    Take 1 tablet (100 mg total) by mouth daily.     Physical Exam: unable due to tele-visit.   Labs reviewed: Basic Metabolic Panel: Recent Labs    06/24/18 1045  10/24/18 1015 11/26/18 1320 12/08/18 1351  NA 143   < > 139 139 141  K 3.9   < > 4.1 3.2* 4.3  CL 107   < > 103 104 103  CO2 24   < > 29 25 29   GLUCOSE 106*   < > 91 99 93  BUN 17   < > 17 14 13   CREATININE 1.53*   < > 1.21* 1.38* 1.38*  CALCIUM 9.5   < > 9.7 9.3 9.8  TSH 1.70  --   --   --   --    < > = values in this interval not displayed.   Liver Function Tests: Recent Labs     06/24/18 1045 10/24/18 1015  AST 19  --   ALT 17 29  BILITOT 0.5  --   PROT 6.8  --    No results for input(s): LIPASE, AMYLASE in the last 8760 hours. No results for input(s): AMMONIA in the last 8760 hours. CBC: Recent Labs    06/24/18 1045 07/25/18 1631 11/22/18 1557 11/26/18 1320  WBC 4.4 4.7 5.1 8.7  NEUTROABS 2,882  --  2,892 5.6  HGB 11.7 12.6 12.5 12.2  HCT 34.7* 38.0 36.3 37.3  MCV 92.8 94.8  90.8 92.6  PLT 293 308 324 280   Lipid Panel: Recent Labs    06/24/18 1045 10/24/18 1015  CHOL 205* 176  HDL 65 65  LDLCALC 121* 94  TRIG 88 84  CHOLHDL 3.2 2.7   TSH: Recent Labs    06/24/18 1045  TSH 1.70   A1C: No results found for: HGBA1C   Assessment/Plan 1. Insomnia, unspecified type -trazodone not effective, will remove from medication list.  -she was on lunesta in the past as needed with good effect.stopped taking because insomnia improved but now worse.  - eszopiclone (LUNESTA) 2 MG TABS tablet; Take 1 tablet (2 mg total) by mouth at bedtime as needed for sleep. Take immediately before sleep  Dispense: 15 tablet; Refill: 1  2. Gout, unspecified cause, unspecified chronicity, unspecified site -no longer having pain, never started allopurinol, low purine diet recommended, will follow up uric acid level with next labs.  3. Pure hypercholesterolemia -wants to stop pravastatin due to hair loss however discussed in detail importance of proper lipid control due to hx of CVA. She is willing to switch to another statin to see if hair loss symptoms improve.  - atorvastatin (LIPITOR) 20 MG tablet; Take 1 tablet (20 mg total) by mouth daily.  Dispense: 90 tablet; Refill: 1  Next appt: 3 months with labs prior to visit.  Carlos American. Harle Battiest  [facility] [phone]   Follow Up Instructions:    I discussed the assessment and treatment plan with the patient. The patient was provided an opportunity to ask questions and all were  answered. The patient agreed with the plan and demonstrated an understanding of the instructions.   The patient was advised to call back or seek an in-person evaluation if the symptoms worsen or if the condition fails to improve as anticipated.  I provided 21 minutes of non-face-to-face time during this encounter.   [name], NP avs printed and mailed.

## 2019-03-29 ENCOUNTER — Ambulatory Visit (INDEPENDENT_AMBULATORY_CARE_PROVIDER_SITE_OTHER): Payer: Medicare HMO | Admitting: Family

## 2019-03-29 ENCOUNTER — Other Ambulatory Visit: Payer: Self-pay

## 2019-03-29 ENCOUNTER — Encounter: Payer: Self-pay | Admitting: Family

## 2019-03-29 VITALS — BP 130/80

## 2019-03-29 DIAGNOSIS — R6884 Jaw pain: Secondary | ICD-10-CM

## 2019-03-29 DIAGNOSIS — K047 Periapical abscess without sinus: Secondary | ICD-10-CM | POA: Diagnosis not present

## 2019-03-29 MED ORDER — SACCHAROMYCES BOULARDII 250 MG PO CAPS: 250.0000 mg | ORAL_CAPSULE | Freq: Two times a day (BID) | ORAL | 0 refills | Status: AC

## 2019-03-29 MED ORDER — AMOXICILLIN-POT CLAVULANATE 875-125 MG PO TABS: 1.0000 | ORAL_TABLET | Freq: Two times a day (BID) | ORAL | 0 refills | Status: AC

## 2019-03-29 NOTE — Patient Instructions (Signed)
Dental Abscess  A dental abscess is an area of pus in or around a tooth. It comes from an infection. It can cause pain and other symptoms. Treatment will help with symptoms and prevent the infection from spreading. Follow these instructions at home: Medicines  Take over-the-counter and prescription medicines only as told by your dentist.  If you were prescribed an antibiotic medicine, take it as told by your dentist. Do not stop taking it even if you start to feel better.  If you were prescribed a gel that has numbing medicine in it, use it exactly as told.  Do not drive or use heavy machinery (like a lawn mower) while taking prescription pain medicine. General instructions  Rinse out your mouth often with salt water. ? To make salt water, dissolve -1 tsp of salt in 1 cup of warm water.  Eat a soft diet while your mouth is healing.  Drink enough fluid to keep your urine pale yellow.  Do not apply heat to the outside of your mouth.  Do not use any products that contain nicotine or tobacco. These include cigarettes and e-cigarettes. If you need help quitting, ask your doctor.  Keep all follow-up visits as told by your dentist. This is important. Prevent an abscess  Brush your teeth every morning and every night. Use fluoride toothpaste.  Floss your teeth each day.  Get dental cleanings as often as told by your dentist.  Think about getting dental sealant put on teeth that have deep holes (decay).  Drink water that has fluoride in it. ? Most tap water has fluoride. ? Check the label on bottled water to see if it has fluoride in it.  Drink water instead of sugary drinks.  Eat healthy meals and snacks.  Wear a mouth guard or face shield when you play sports. Contact a doctor if:  Your pain is worse, and medicine does not help. Get help right away if:  You have a fever or chills.  Your symptoms suddenly get worse.  You have a very bad headache.  You have problems  breathing or swallowing.  You have trouble opening your mouth.  You have swelling in your neck or close to your eye. Summary  A dental abscess is an area of pus in or around a tooth. It is caused by an infection.  Treatment will help with symptoms and prevent the infection from spreading.  Take over-the-counter and prescription medicines only as told by your dentist.  To prevent an abscess, take good care of your teeth. Brush your teeth every morning and night. Use floss every day.  Get dental cleanings as often as told by your dentist. This information is not intended to replace advice given to you by your health care provider. Make sure you discuss any questions you have with your health care provider. Document Released: 03/19/2015 Document Revised: 07/05/2017 Document Reviewed: 07/05/2017 Elsevier Interactive Patient Education  2019 [employer]  

## 2019-03-29 NOTE — Progress Notes (Addendum)
This service is provided via telemedicine  No vital signs collected/recorded due to the encounter was a telemedicine visit.   Location of patient (ex: home, work):  Home   Patient consents to a telephone visit:  Yes   Location of the provider (ex: office, home):  Office   Name of any referring provider:  [name], NP   Names of all persons participating in the telemedicine service and their role in the encounter: Ruthell Rummage CMA, [place]  NP, Early Chars   Time spent on call:  Ruthell Rummage CMA spent 5  Minutes on phone with patient     [facility] clinic   Provider: Marlowe Sax, NP   Code Status: FULL  Goals of Care:  Advanced Directives 12/08/2018  Does Patient Have a Medical Advance Directive? No  Would patient like information on creating a medical advance directive? Yes (MAU/Ambulatory/Procedural Areas - Information given)     No chief complaint on file.   HPI: Patient is a 63 y.o. female seen today for an acute visit for tooth abscess.she states has her left top last tooth/gum has been swollen and painful unable to chew food.also states left side jaw has been swollen.she noticed drainage from the abscess site yesterday which seems to have help with the swelling of the gum but jaw is still swollen today.she denies any fever or chills.she thinks she has a cavity on the tooth but unable to go to the dentist due to COVID-19 restrictions not sure whether the dentist office is open.    Past Medical History:  Diagnosis Date  . Arthritis    lt knee  . Breast cancer ([place])   . Breast cancer of upper-outer quadrant of left female breast ([place]) 04/02/2016  . Colon polyps   . COPD (chronic obstructive pulmonary disease) ([place])   . Hyperlipidemia   . Hypertension   . Multiple sclerosis ([place])   . Personal history of chemotherapy   . Stroke ([place])    2013, mild cognitive deficits  . Vision abnormalities     Past Surgical History:  Procedure Laterality Date  .  BREAST BIOPSY Left 03/31/2016  . BREAST LUMPECTOMY Left 04/17/2016  . HYSTEROSCOPY W/D&C N/A 12/19/2015   Procedure: DILATATION AND CURETTAGE /HYSTEROSCOPY;  Surgeon: Linda Hedges, DO;  Location: [facility];  Service: Gynecology;  Laterality: N/A;  . RADIOACTIVE SEED GUIDED PARTIAL MASTECTOMY WITH AXILLARY SENTINEL LYMPH NODE BIOPSY Left 04/17/2016   Procedure: RADIOACTIVE SEED GUIDED PARTIAL MASTECTOMY WITH AXILLARY SENTINEL LYMPH NODE BIOPSY;  Surgeon: Erroll Luna, MD;  Location: [facility];  Service: General;  Laterality: Left;  RADIOACTIVE SEED GUIDED PARTIAL MASTECTOMY WITH AXILLARY SENTINEL LYMPH NODE BIOPSY   . TUBAL LIGATION    . VAGINAL DELIVERY     x3    No Known Allergies  Outpatient Encounter Medications as of 03/29/2019  Medication Sig  . anastrozole (ARIMIDEX) 1 MG tablet Take 1 tablet (1 mg total) by mouth daily.  Marland Kitchen aspirin EC 81 MG tablet Take 1 tablet (81 mg total) by mouth daily.  Marland Kitchen atorvastatin (LIPITOR) 20 MG tablet Take 1 tablet (20 mg total) by mouth daily.  . eszopiclone (LUNESTA) 2 MG TABS tablet Take 1 tablet (2 mg total) by mouth at bedtime as needed for sleep. Take immediately before sleep  . labetalol (NORMODYNE) 300 MG tablet TAKE 2 TABLETS BY MOUTH THREE TIMES DAILY  . NIFEdipine (ADALAT CC) 60 MG 24 hr tablet Take 1 tablet (60 mg total) by mouth 2 (two) times  daily.   No facility-administered encounter medications on file as of 03/29/2019.     Review of Systems:  Review of Systems  Constitutional: Negative for appetite change, chills, fatigue and fever.  HENT: Positive for dental problem. Negative for congestion, rhinorrhea, sinus pressure, sinus pain, sneezing, sore throat and trouble swallowing.        Left jaw/gum swelling and painful per HPI   Eyes: Negative for pain, discharge and redness.  Respiratory: Negative for cough, chest tightness, shortness of breath and wheezing.   Cardiovascular: Negative for chest pain and palpitations.   Neurological: Negative for dizziness, light-headedness and headaches.    Health Maintenance  Topic Date Due  . COLONOSCOPY  07/07/2018  . TETANUS/TDAP  11/17/2019 (Originally 08/03/1975)  . Hepatitis C Screening  12/09/2019 (Originally Oct 16, 1956)  . HIV Screening  12/09/2019 (Originally 08/03/1971)  . INFLUENZA VACCINE  06/17/2019  . MAMMOGRAM  12/05/2020  . PAP SMEAR-Modifier  05/02/2021    Physical Exam: There were no vitals filed for this visit. There is no height or weight on file to calculate BMI. Physical Exam Unable to complete on telephone visit   Labs reviewed: Basic Metabolic Panel: Recent Labs    06/24/18 1045  10/24/18 1015 11/26/18 1320 12/08/18 1351  NA 143   < > 139 139 141  K 3.9   < > 4.1 3.2* 4.3  CL 107   < > 103 104 103  CO2 24   < > 29 25 29   GLUCOSE 106*   < > 91 99 93  BUN 17   < > 17 14 13   CREATININE 1.53*   < > 1.21* 1.38* 1.38*  CALCIUM 9.5   < > 9.7 9.3 9.8  TSH 1.70  --   --   --   --    < > = values in this interval not displayed.   Liver Function Tests: Recent Labs    06/24/18 1045 10/24/18 1015  AST 19  --   ALT 17 29  BILITOT 0.5  --   PROT 6.8  --    CBC: Recent Labs    06/24/18 1045 07/25/18 1631 11/22/18 1557 11/26/18 1320  WBC 4.4 4.7 5.1 8.7  NEUTROABS 2,882  --  2,892 5.6  HGB 11.7 12.6 12.5 12.2  HCT 34.7* 38.0 36.3 37.3  MCV 92.8 94.8 90.8 92.6  PLT 293 308 324 280   Lipid Panel: Recent Labs    06/24/18 1045 10/24/18 1015  CHOL 205* 176  HDL 65 65  LDLCALC 121* 94  TRIG 88 84  CHOLHDL 3.2 2.7    Procedures since last visit:  Assessment/Plan   Tooth abscess/Jaw pain Reports no fever or chills.left jaw and upper molars gum swollen and painful concerning for infection. - gurgle left upper molar with warm salt water daily. - Augmentin 875/125 mg tablet take one by mouth twice daily x 7 days  - florastor 250 mg capsule or equivalent probiotic one by mouth twice daily x 10 days for antibiotic  associated diarrhea preventions.  - May take over the counter Tylenol as needed for pain  - encouraged to call dentist office to make appointment for evaluation of left upper molar for possible cavities.  - Encouraged to go to the ER if symptoms does not resolve or improve.   Labs/tests ordered: None  Next appt:  04/11/2019  Time spent with patient 11 minutes >50% time spent counseling; reviewing medical record; tests; labs; and developing future plan of care.

## 2019-04-11 ENCOUNTER — Other Ambulatory Visit: Payer: Self-pay

## 2019-04-11 ENCOUNTER — Ambulatory Visit (INDEPENDENT_AMBULATORY_CARE_PROVIDER_SITE_OTHER): Payer: Medicare HMO | Admitting: Nurse Practitioner

## 2019-04-11 ENCOUNTER — Encounter: Payer: Self-pay | Admitting: Nurse Practitioner

## 2019-04-11 VITALS — BP 128/82 | HR 71 | Temp 98.4°F | Ht 62.0 in | Wt 198.6 lb

## 2019-04-11 DIAGNOSIS — K047 Periapical abscess without sinus: Secondary | ICD-10-CM

## 2019-04-11 DIAGNOSIS — E7800 Pure hypercholesterolemia, unspecified: Secondary | ICD-10-CM

## 2019-04-11 DIAGNOSIS — Z853 Personal history of malignant neoplasm of breast: Secondary | ICD-10-CM | POA: Diagnosis not present

## 2019-04-11 DIAGNOSIS — I10 Essential (primary) hypertension: Secondary | ICD-10-CM | POA: Diagnosis not present

## 2019-04-11 DIAGNOSIS — I69359 Hemiplegia and hemiparesis following cerebral infarction affecting unspecified side: Secondary | ICD-10-CM

## 2019-04-11 DIAGNOSIS — M109 Gout, unspecified: Secondary | ICD-10-CM | POA: Diagnosis not present

## 2019-04-11 DIAGNOSIS — G4700 Insomnia, unspecified: Secondary | ICD-10-CM | POA: Diagnosis not present

## 2019-04-11 NOTE — Progress Notes (Signed)
Careteam: Patient Care Team: [name], NP as PCP - General (Geriatric Medicine) Nicholas Lose, MD as Consulting Physician (Oncology) Erroll Luna, MD as Consulting Physician (General Surgery) Thea Silversmith, MD as Consulting Physician (Radiation Oncology)  Advanced Directive information Does Patient Have a Medical Advance Directive?: No, Would patient like information on creating a medical advance directive?: No - Patient declined  No Known Allergies  Chief Complaint  Patient presents with  . Medical Management of Chronic Issues    4 month follow-up. Fasting if labs due   . Best Practice Recommendations    Referral for colonoscopy   . Immunizations    Refused vaccine updates      HPI: Patient is a 63 y.o. female seen in the office today for routine follow up   Pt was given antibiotic(augmentin) due to tooth abscess- last dose today tooth and swelling feeling much better- need to get tooth out but has not called and made appt.   Has not gotten colonoscopy - knows she was supposed to follow up with GI but did not go to follow up   Insomnia- controlled on lunesta- does not take every night, just uses occasionally (could use nightly bc she "fights the nights" she does not take)  Gout- no flares, not taking allopurinol. Tried to do diet but "not working"  Hyperlipidemia- switched to Lipitor due to hair loss that she was contributing to pravastatin at the beginning of the month.   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 She completed XRT in June 2018. She was told no chemotx needed. On femara from 04/2017- Dec 2018 and changed to tamoxifen due to hair loss concerns. Arimidex caused hair lossin the past but she is now taking it again. Followed by Dr Lindi Adie.   Hypertension- blood pressure stable on nifedipine and labetolol.   Hx of CVA- continues on ASA daily, no further deficits. Right side weaker  SLIGHTLY than left.   Doing very well. No aches or pains. Best she's felt in a while.  Review of Systems:  Review of Systems  Constitutional: Negative for chills and fever.  HENT: Negative for congestion, hearing loss and sinus pain.   Respiratory: Negative for sputum production.   Cardiovascular: Negative for leg swelling.  Gastrointestinal: Negative for abdominal pain, constipation and diarrhea.  Genitourinary: Negative.   Musculoskeletal: Negative for joint pain and myalgias.  Skin: Negative for itching and rash.  Neurological: Negative for dizziness, focal weakness and headaches.  Psychiatric/Behavioral: Negative for depression. The patient has insomnia. The patient is not nervous/anxious.     Past Medical History:  Diagnosis Date  . Arthritis    lt knee  . Breast cancer ([place])   . Breast cancer of upper-outer quadrant of left female breast ([place]) 04/02/2016  . Colon polyps   . COPD (chronic obstructive pulmonary disease) ([place])   . Hyperlipidemia   . Hypertension   . Multiple sclerosis ([place])   . Personal history of chemotherapy   . Stroke ([place])    2013, mild cognitive deficits  . Vision abnormalities    Past Surgical History:  Procedure Laterality Date  . BREAST BIOPSY Left 03/31/2016  . BREAST LUMPECTOMY Left 04/17/2016  . HYSTEROSCOPY W/D&C N/A 12/19/2015   Procedure: DILATATION AND CURETTAGE /HYSTEROSCOPY;  Surgeon: Linda Hedges, DO;  Location: [facility];  Service: Gynecology;  Laterality: N/A;  . RADIOACTIVE SEED GUIDED PARTIAL MASTECTOMY WITH AXILLARY SENTINEL LYMPH NODE BIOPSY  Left 04/17/2016   Procedure: RADIOACTIVE SEED GUIDED PARTIAL MASTECTOMY WITH AXILLARY SENTINEL LYMPH NODE BIOPSY;  Surgeon: Erroll Luna, MD;  Location: [facility];  Service: General;  Laterality: Left;  RADIOACTIVE SEED GUIDED PARTIAL MASTECTOMY WITH AXILLARY SENTINEL LYMPH NODE BIOPSY   . TUBAL LIGATION    . VAGINAL DELIVERY     x3   Social History:   reports that she quit  smoking about 14 years ago. Her smoking use included cigarettes. She has a 12.50 pack-year smoking history. She has never used smokeless tobacco. She reports current alcohol use. She reports that she does not use drugs.  Family History  Problem Relation Age of Onset  . Stroke Sister   . Hypertension Mother   . Stroke Mother   . Alcohol abuse Brother   . HIV/AIDS Brother     Medications: Patient's Medications  New Prescriptions   No medications on file  Previous Medications   ANASTROZOLE (ARIMIDEX) 1 MG TABLET    Take 1 tablet (1 mg total) by mouth daily.   ASPIRIN EC 81 MG TABLET    Take 1 tablet (81 mg total) by mouth daily.   ATORVASTATIN (LIPITOR) 20 MG TABLET    Take 1 tablet (20 mg total) by mouth daily.   ESZOPICLONE (LUNESTA) 2 MG TABS TABLET    Take 1 tablet (2 mg total) by mouth at bedtime as needed for sleep. Take immediately before sleep   LABETALOL (NORMODYNE) 300 MG TABLET    TAKE 2 TABLETS BY MOUTH THREE TIMES DAILY   NIFEDIPINE (ADALAT CC) 60 MG 24 HR TABLET    Take 1 tablet (60 mg total) by mouth 2 (two) times daily.  Modified Medications   No medications on file  Discontinued Medications   No medications on file     Physical Exam:  Vitals:   04/11/19 0820  BP: 128/82  Pulse: 71  Temp: 98.4 F (36.9 C)  TempSrc: Oral  SpO2: 97%  Weight: 198 lb 9.6 oz (90.1 kg)  Height: 5\' 2"  (1.575 m)   Body mass index is 36.32 kg/m.  Physical Exam Constitutional:      Appearance: Normal appearance. She is well-developed.  HENT:     Mouth/Throat:     Pharynx: No oropharyngeal exudate.  Eyes:     General: No scleral icterus.    Pupils: Pupils are equal, round, and reactive to light.  Neck:     Musculoskeletal: Neck supple.     Thyroid: No thyromegaly.     Vascular: No carotid bruit.     Trachea: No tracheal deviation.  Cardiovascular:     Rate and Rhythm: Normal rate and regular rhythm.     Heart sounds: Murmur (1/6 SEM) present. No friction rub. No gallop.       Comments: No LE edema b/l. no calf TTP.  Pulmonary:     Effort: Pulmonary effort is normal. No respiratory distress.     Breath sounds: Normal breath sounds. No stridor.  Abdominal:     General: Bowel sounds are normal.     Palpations: Abdomen is soft. Abdomen is not rigid. There is no hepatomegaly.     Comments: obese  Lymphadenopathy:     Cervical: No cervical adenopathy.  Skin:    General: Skin is warm and dry.     Findings: No rash.  Neurological:     Mental Status: She is alert and oriented to person, place, and time.     Deep Tendon Reflexes: Reflexes are  normal and symmetric.  Psychiatric:        Speech: Speech is slurred.        Behavior: Behavior normal.        Thought Content: Thought content normal.        Judgment: Judgment normal.     Comments: Expressive aphasia     Labs reviewed: Basic Metabolic Panel: Recent Labs    06/24/18 1045  10/24/18 1015 11/26/18 1320 12/08/18 1351  NA 143   < > 139 139 141  K 3.9   < > 4.1 3.2* 4.3  CL 107   < > 103 104 103  CO2 24   < > 29 25 29   GLUCOSE 106*   < > 91 99 93  BUN 17   < > 17 14 13   CREATININE 1.53*   < > 1.21* 1.38* 1.38*  CALCIUM 9.5   < > 9.7 9.3 9.8  TSH 1.70  --   --   --   --    < > = values in this interval not displayed.   Liver Function Tests: Recent Labs    06/24/18 1045 10/24/18 1015  AST 19  --   ALT 17 29  BILITOT 0.5  --   PROT 6.8  --    No results for input(s): LIPASE, AMYLASE in the last 8760 hours. No results for input(s): AMMONIA in the last 8760 hours. CBC: Recent Labs    06/24/18 1045 07/25/18 1631 11/22/18 1557 11/26/18 1320  WBC 4.4 4.7 5.1 8.7  NEUTROABS 2,882  --  2,892 5.6  HGB 11.7 12.6 12.5 12.2  HCT 34.7* 38.0 36.3 37.3  MCV 92.8 94.8 90.8 92.6  PLT 293 308 324 280   Lipid Panel: Recent Labs    06/24/18 1045 10/24/18 1015  CHOL 205* 176  HDL 65 65  LDLCALC 121* 94  TRIG 88 84  CHOLHDL 3.2 2.7   TSH: Recent Labs    06/24/18 1045  TSH 1.70    A1C: No results found for: HGBA1C   Assessment/Plan 1. Gout, unspecified cause, unspecified chronicity, unspecified site No pain or acute flare, unable due adhere to low purine diet.  - Uric acid  2. Tooth abscess Pain and swelling has resolved, encouraged to follow up with dentist for proper tooth care.  3. Insomnia, unspecified type Ongoing, using lunesta with good results.   4. Pure hypercholesterolemia -recently changed to Lipitor as she felt pravastatin was contributing to hair loss - COMPLETE METABOLIC PANEL WITH GFR - Lipid Panel  5. Essential hypertension -blood pressure stable, continues on labetolol  - CBC with Differential/Platelet - COMPLETE METABOLIC PANEL WITH GFR  6. Hemiplegia following CVA (cerebrovascular accident) [facility]) Doing well with mild weakness. Continues on ASA daily - CBC with Differential/Platelet  7. History of breast cancer Ongoing follow up with oncology. continues on anastrozole daily   Next appt: 4 month follow up  [place]. [place], [facility] [phone]

## 2019-04-11 NOTE — Patient Instructions (Addendum)
To call and set up follow up colonoscopy with Dr Hilarie Fredrickson, Lajuan Lines, MD

## 2019-04-13 DIAGNOSIS — M109 Gout, unspecified: Secondary | ICD-10-CM | POA: Diagnosis not present

## 2019-04-13 DIAGNOSIS — I69359 Hemiplegia and hemiparesis following cerebral infarction affecting unspecified side: Secondary | ICD-10-CM | POA: Diagnosis not present

## 2019-04-13 DIAGNOSIS — R7309 Other abnormal glucose: Secondary | ICD-10-CM | POA: Diagnosis not present

## 2019-04-13 DIAGNOSIS — E7800 Pure hypercholesterolemia, unspecified: Secondary | ICD-10-CM | POA: Diagnosis not present

## 2019-04-13 DIAGNOSIS — I10 Essential (primary) hypertension: Secondary | ICD-10-CM | POA: Diagnosis not present

## 2019-04-18 LAB — TEST AUTHORIZATION

## 2019-04-18 LAB — HEMOGLOBIN A1C
Hgb A1c MFr Bld: 5.7 % of total Hgb — ABNORMAL HIGH (ref <5.7)
Mean Plasma Glucose: 117 (calc)
eAG (mmol/L): 6.5 (calc)

## 2019-04-18 LAB — CBC WITH DIFFERENTIAL/PLATELET
Absolute Monocytes: 260 cells/uL (ref 200–950)
Basophils Absolute: 40 cells/uL (ref 0–200)
Basophils Relative: 0.9 %
Eosinophils Absolute: 180 cells/uL (ref 15–500)
Eosinophils Relative: 4.1 %
HCT: 37.6 % (ref 35.0–45.0)
Hemoglobin: 12.6 g/dL (ref 11.7–15.5)
Lymphs Abs: 1355 cells/uL (ref 850–3900)
MCH: 30.7 pg (ref 27.0–33.0)
MCHC: 33.5 g/dL (ref 32.0–36.0)
MCV: 91.7 fL (ref 80.0–100.0)
MPV: 10.3 fL (ref 7.5–12.5)
Monocytes Relative: 5.9 %
Neutro Abs: 2565 cells/uL (ref 1500–7800)
Neutrophils Relative %: 58.3 %
Platelets: 331 10*3/uL (ref 140–400)
RBC: 4.1 10*6/uL (ref 3.80–5.10)
RDW: 12.8 % (ref 11.0–15.0)
Total Lymphocyte: 30.8 %
WBC: 4.4 10*3/uL (ref 3.8–10.8)

## 2019-04-18 LAB — COMPLETE METABOLIC PANEL WITH GFR
AG Ratio: 1.8 (calc) (ref 1.0–2.5)
ALT: 14 U/L (ref 6–29)
AST: 21 U/L (ref 10–35)
Albumin: 4.4 g/dL (ref 3.6–5.1)
Alkaline phosphatase (APISO): 93 U/L (ref 37–153)
BUN/Creatinine Ratio: 10 (calc) (ref 6–22)
BUN: 12 mg/dL (ref 7–25)
CO2: 22 mmol/L (ref 20–32)
Calcium: 9.6 mg/dL (ref 8.6–10.4)
Chloride: 105 mmol/L (ref 98–110)
Creat: 1.19 mg/dL — ABNORMAL HIGH (ref 0.50–0.99)
GFR, Est African American: 56 mL/min/{1.73_m2} — ABNORMAL LOW (ref >=60)
GFR, Est Non African American: 49 mL/min/{1.73_m2} — ABNORMAL LOW (ref >=60)
Globulin: 2.5 g/dL (calc) (ref 1.9–3.7)
Glucose, Bld: 124 mg/dL — ABNORMAL HIGH (ref 65–99)
Potassium: 3.9 mmol/L (ref 3.5–5.3)
Sodium: 140 mmol/L (ref 135–146)
Total Bilirubin: 0.4 mg/dL (ref 0.2–1.2)
Total Protein: 6.9 g/dL (ref 6.1–8.1)

## 2019-04-18 LAB — LIPID PANEL
Cholesterol: 138 mg/dL (ref <200)
HDL: 65 mg/dL (ref >=50)
LDL Cholesterol (Calc): 58 mg/dL (calc)
Non-HDL Cholesterol (Calc): 73 mg/dL (calc) (ref <130)
Total CHOL/HDL Ratio: 2.1 (calc) (ref <5.0)
Triglycerides: 74 mg/dL (ref <150)

## 2019-04-18 LAB — URIC ACID: Uric Acid, Serum: 6.8 mg/dL (ref 2.5–7.0)

## 2019-04-19 ENCOUNTER — Other Ambulatory Visit: Payer: Self-pay

## 2019-04-19 ENCOUNTER — Ambulatory Visit (INDEPENDENT_AMBULATORY_CARE_PROVIDER_SITE_OTHER): Payer: Medicare HMO | Admitting: Nurse Practitioner

## 2019-04-19 ENCOUNTER — Telehealth: Payer: Self-pay | Admitting: *Deleted

## 2019-04-19 ENCOUNTER — Encounter: Payer: Self-pay | Admitting: Nurse Practitioner

## 2019-04-19 DIAGNOSIS — R1030 Lower abdominal pain, unspecified: Secondary | ICD-10-CM | POA: Diagnosis not present

## 2019-04-19 NOTE — Patient Instructions (Signed)
Can use tylenol 325 mg 2 tablets every 6 hours as needed for pain.   Will get blood work and CT scan of abdomen for further evaluation at this time

## 2019-04-19 NOTE — Progress Notes (Signed)
Careteam: Patient Care Team: [name], NP as PCP - General (Geriatric Medicine) Nicholas Lose, MD as Consulting Physician (Oncology) Erroll Luna, MD as Consulting Physician (General Surgery) Thea Silversmith, MD as Consulting Physician (Radiation Oncology)  Advanced Directive information    No Known Allergies  Chief Complaint  Patient presents with  . Acute Visit    Left lower side and back pain since Monday, no known injury      HPI: Patient is a 63 y.o. female seen in the office today due to left ow abdomen pain for 2 days. Pain comes and goes. Off and on. Worse when walking/bending or pressing side. Reports she will get a sharp pain. States it is in a round circle. Bothers her when she walking.  Reports pain 5-6/10.  Has not done anything to help the pain States she took a tylenol 325 2 tablets which helped the pain, brought the pain down to 5/10 from an 8/10 No pain with urination. Drinking plenty of water and clear urine.  No nausea or vomiting or diarrhea.  Has been having regular BMs No shortness of breath, chest pain or cough.  Review of Systems:  Review of Systems  Constitutional: Negative for chills, fever and malaise/fatigue.  Respiratory: Negative for cough and shortness of breath.   Cardiovascular: Negative for chest pain.  Gastrointestinal: Positive for abdominal pain.  Genitourinary: Negative for dysuria, frequency, hematuria and urgency.  Musculoskeletal: Positive for myalgias. Negative for back pain.    Past Medical History:  Diagnosis Date  . Arthritis    lt knee  . Breast cancer ([place])   . Breast cancer of upper-outer quadrant of left female breast ([place]) 04/02/2016  . Colon polyps   . COPD (chronic obstructive pulmonary disease) ([place])   . Hyperlipidemia   . Hypertension   . Multiple sclerosis ([place])   . Personal history of chemotherapy   . 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 Vision abnormalities    Past Surgical  History:  Procedure Laterality Date  . BREAST BIOPSY Left 03/31/2016  . BREAST LUMPECTOMY Left 04/17/2016  . HYSTEROSCOPY W/D&C N/A 12/19/2015   Procedure: DILATATION AND CURETTAGE /HYSTEROSCOPY;  Surgeon: Linda Hedges, DO;  Location: [facility];  Service: Gynecology;  Laterality: N/A;  . RADIOACTIVE SEED GUIDED PARTIAL MASTECTOMY WITH AXILLARY SENTINEL LYMPH NODE BIOPSY Left 04/17/2016   Procedure: RADIOACTIVE SEED GUIDED PARTIAL MASTECTOMY WITH AXILLARY SENTINEL LYMPH NODE BIOPSY;  Surgeon: Erroll Luna, MD;  Location: [facility];  Service: General;  Laterality: Left;  RADIOACTIVE SEED GUIDED PARTIAL MASTECTOMY WITH AXILLARY SENTINEL LYMPH NODE BIOPSY   . TUBAL LIGATION    . VAGINAL DELIVERY     x3   Social History:   reports that she quit smoking about 14 years ago. Her smoking use included cigarettes. She has a 12.50 pack-year smoking history. She has never used smokeless tobacco. She reports current alcohol use. She reports that she does not use drugs.  Family History  Problem Relation Age of Onset  . Stroke Sister   . Hypertension Mother   . Stroke Mother   . Alcohol abuse Brother   . HIV/AIDS Brother     Medications: Patient's Medications  New Prescriptions   No medications on file  Previous Medications   ANASTROZOLE (ARIMIDEX) 1 MG TABLET    Take 1 tablet (1 mg total) by mouth daily.   ASPIRIN EC 81 MG TABLET    Take 1 tablet (81 mg total) by  mouth daily.   ATORVASTATIN (LIPITOR) 20 MG TABLET    Take 1 tablet (20 mg total) by mouth daily.   ESZOPICLONE (LUNESTA) 2 MG TABS TABLET    Take 1 tablet (2 mg total) by mouth at bedtime as needed for sleep. Take immediately before sleep   LABETALOL (NORMODYNE) 300 MG TABLET    TAKE 2 TABLETS BY MOUTH THREE TIMES DAILY   NIFEDIPINE (ADALAT CC) 60 MG 24 HR TABLET    Take 1 tablet (60 mg total) by mouth 2 (two) times daily.  Modified Medications   No medications on file  Discontinued Medications   No medications on file     Physical Exam:  Vitals:   04/19/19 0951  BP: 126/70  Pulse: 74  Temp: 98.2 F (36.8 C)  TempSrc: Oral  SpO2: 96%  Weight: 198 lb 9.6 oz (90.1 kg)  Height: 5\' 2"  (1.575 m)   Body mass index is 36.32 kg/m. Wt Readings from Last 3 Encounters:  04/19/19 198 lb 9.6 oz (90.1 kg)  04/11/19 198 lb 9.6 oz (90.1 kg)  12/08/18 197 lb 3.2 oz (89.4 kg)    Physical Exam Constitutional:      Appearance: Normal appearance.  HENT:     Head: Normocephalic and atraumatic.  Cardiovascular:     Rate and Rhythm: Normal rate and regular rhythm.     Pulses: Normal pulses.  Pulmonary:     Effort: Pulmonary effort is normal.     Breath sounds: Normal breath sounds.  Abdominal:     General: Bowel sounds are normal. There is no distension.     Palpations: Abdomen is soft.     Tenderness: There is abdominal tenderness in the left lower quadrant. There is guarding.     Comments: Left lower quad tenderness that goes to left side  Neurological:     Mental Status: She is alert.     Labs reviewed: Basic Metabolic Panel: Recent Labs    06/24/18 1045  11/26/18 1320 12/08/18 1351 04/13/19 0912  NA 143   < > 139 141 140  K 3.9   < > 3.2* 4.3 3.9  CL 107   < > 104 103 105  CO2 24   < > 25 29 22   GLUCOSE 106*   < > 99 93 124*  BUN 17   < > 14 13 12   CREATININE 1.53*   < > 1.38* 1.38* 1.19*  CALCIUM 9.5   < > 9.3 9.8 9.6  TSH 1.70  --   --   --   --    < > = values in this interval not displayed.   Liver Function Tests: Recent Labs    06/24/18 1045 10/24/18 1015 04/13/19 0912  AST 19  --  21  ALT 17 29 14   BILITOT 0.5  --  0.4  PROT 6.8  --  6.9   No results for input(s): LIPASE, AMYLASE in the last 8760 hours. No results for input(s): AMMONIA in the last 8760 hours. CBC: Recent Labs    11/22/18 1557 11/26/18 1320 04/13/19 0912  WBC 5.1 8.7 4.4  NEUTROABS 2,892 5.6 2,565  HGB 12.5 12.2 12.6  HCT 36.3 37.3 37.6  MCV 90.8 92.6 91.7  PLT 324 280 331   Lipid Panel:  Recent Labs    06/24/18 1045 10/24/18 1015 04/13/19 0912  CHOL 205* 176 138  HDL 65 65 65  LDLCALC 121* 94 58  TRIG 88 84 74  CHOLHDL 3.2 2.7 2.1  TSH: Recent Labs    06/24/18 1045  TSH 1.70   A1C: Lab Results  Component Value Date   HGBA1C 5.7 (H) 04/13/2019     Assessment/Plan 1. Lower abdominal pain -significant diffuse lower abdominal pain, no other associated symptoms (N,V,D or constipation) reports she feels fine other than significant pain, eating and drinking well. No fever (not on fever reducing medication).  - CBC with Differential/Platelets - CT Abdomen Pelvis W Contrast; Future - COMPLETE METABOLIC PANEL WITH GFR - Amylase - Lipase -strict precautions to go to ED if symptoms worsen, nausea, vomiting or fever occur  Michele Page K. [place], [facility] [phone]

## 2019-04-19 NOTE — Telephone Encounter (Signed)
Michele Page ordered CT Abd/Pelvis with Contrast. [facility] [phone] and spoke with Porsha.  Reference #:[id number] Authorization Code: [id number] approved through 05/19/2019.

## 2019-04-20 ENCOUNTER — Other Ambulatory Visit: Payer: Self-pay | Admitting: Nurse Practitioner

## 2019-04-20 ENCOUNTER — Ambulatory Visit
Admission: RE | Admit: 2019-04-20 | Discharge: 2019-04-20 | Disposition: A | Discharge status: Home / Self Care | Payer: Medicare HMO | Source: Ambulatory Visit | Attending: Nurse Practitioner | Admitting: Nurse Practitioner

## 2019-04-20 DIAGNOSIS — K760 Fatty (change of) liver, not elsewhere classified: Secondary | ICD-10-CM | POA: Diagnosis not present

## 2019-04-20 DIAGNOSIS — R1030 Lower abdominal pain, unspecified: Secondary | ICD-10-CM

## 2019-04-20 DIAGNOSIS — K5790 Diverticulosis of intestine, part unspecified, without perforation or abscess without bleeding: Secondary | ICD-10-CM

## 2019-04-20 DIAGNOSIS — K5730 Diverticulosis of large intestine without perforation or abscess without bleeding: Secondary | ICD-10-CM | POA: Diagnosis not present

## 2019-04-20 LAB — COMPLETE METABOLIC PANEL WITH GFR
AG Ratio: 2 (calc) (ref 1.0–2.5)
ALT: 18 U/L (ref 6–29)
AST: 21 U/L (ref 10–35)
Albumin: 4.6 g/dL (ref 3.6–5.1)
Alkaline phosphatase (APISO): 96 U/L (ref 37–153)
BUN/Creatinine Ratio: 11 (calc) (ref 6–22)
BUN: 13 mg/dL (ref 7–25)
CO2: 23 mmol/L (ref 20–32)
Calcium: 9.9 mg/dL (ref 8.6–10.4)
Chloride: 106 mmol/L (ref 98–110)
Creat: 1.2 mg/dL — ABNORMAL HIGH (ref 0.50–0.99)
GFR, Est African American: 56 mL/min/{1.73_m2} — ABNORMAL LOW (ref >=60)
GFR, Est Non African American: 48 mL/min/{1.73_m2} — ABNORMAL LOW (ref >=60)
Globulin: 2.3 g/dL (calc) (ref 1.9–3.7)
Glucose, Bld: 119 mg/dL — ABNORMAL HIGH (ref 65–99)
Potassium: 4.2 mmol/L (ref 3.5–5.3)
Sodium: 141 mmol/L (ref 135–146)
Total Bilirubin: 0.5 mg/dL (ref 0.2–1.2)
Total Protein: 6.9 g/dL (ref 6.1–8.1)

## 2019-04-20 LAB — CBC WITH DIFFERENTIAL/PLATELET
Absolute Monocytes: 355 cells/uL (ref 200–950)
Basophils Absolute: 37 cells/uL (ref 0–200)
Basophils Relative: 0.7 %
Eosinophils Absolute: 90 cells/uL (ref 15–500)
Eosinophils Relative: 1.7 %
HCT: 36.4 % (ref 35.0–45.0)
Hemoglobin: 11.9 g/dL (ref 11.7–15.5)
Lymphs Abs: 1092 cells/uL (ref 850–3900)
MCH: 30 pg (ref 27.0–33.0)
MCHC: 32.7 g/dL (ref 32.0–36.0)
MCV: 91.7 fL (ref 80.0–100.0)
MPV: 10.3 fL (ref 7.5–12.5)
Monocytes Relative: 6.7 %
Neutro Abs: 3726 cells/uL (ref 1500–7800)
Neutrophils Relative %: 70.3 %
Platelets: 298 10*3/uL (ref 140–400)
RBC: 3.97 10*6/uL (ref 3.80–5.10)
RDW: 12.6 % (ref 11.0–15.0)
Total Lymphocyte: 20.6 %
WBC: 5.3 10*3/uL (ref 3.8–10.8)

## 2019-04-20 LAB — LIPASE: Lipase: 34 U/L (ref 7–60)

## 2019-04-20 LAB — AMYLASE: Amylase: 53 U/L (ref 21–101)

## 2019-04-20 MED ORDER — CIPROFLOXACIN HCL 500 MG PO TABS: 500.0000 mg | ORAL_TABLET | Freq: Two times a day (BID) | ORAL | 0 refills | Status: DC

## 2019-04-20 MED ORDER — IOPAMIDOL (ISOVUE-300) INJECTION 61%
100.0000 mL | Freq: Once | INTRAVENOUS | Status: AC | PRN
  Administered 2019-04-20: 100 mL via INTRAVENOUS

## 2019-04-20 MED ORDER — METRONIDAZOLE 500 MG PO TABS: 500.0000 mg | ORAL_TABLET | Freq: Three times a day (TID) | ORAL | 0 refills | Status: DC

## 2019-04-24 ENCOUNTER — Other Ambulatory Visit: Payer: Self-pay

## 2019-04-24 ENCOUNTER — Ambulatory Visit (INDEPENDENT_AMBULATORY_CARE_PROVIDER_SITE_OTHER): Payer: Medicare HMO | Admitting: Internal Medicine

## 2019-04-24 ENCOUNTER — Encounter: Payer: Self-pay | Admitting: Internal Medicine

## 2019-04-24 DIAGNOSIS — H1012 Acute atopic conjunctivitis, left eye: Secondary | ICD-10-CM | POA: Diagnosis not present

## 2019-04-24 DIAGNOSIS — K5790 Diverticulosis of intestine, part unspecified, without perforation or abscess without bleeding: Secondary | ICD-10-CM | POA: Diagnosis not present

## 2019-04-24 DIAGNOSIS — G4700 Insomnia, unspecified: Secondary | ICD-10-CM

## 2019-04-24 MED ORDER — ESZOPICLONE 2 MG PO TABS: 2.0000 mg | ORAL_TABLET | Freq: Every evening | ORAL | 1 refills | Status: DC | PRN

## 2019-04-24 MED ORDER — PREDNISONE 10 MG (21) PO TBPK: ORAL_TABLET | ORAL | 0 refills | Status: DC

## 2019-04-24 MED ORDER — CETIRIZINE HCL 10 MG PO TABS: 10.0000 mg | ORAL_TABLET | Freq: Every day | ORAL | 0 refills | Status: DC

## 2019-04-24 NOTE — Progress Notes (Signed)
Patient ID: Michele Page, female   DOB: June 20, 1956, 63 y.o.   MRN: [id number] This service is provided via telemedicine  No vital signs collected/recorded due to the encounter was a telemedicine visit.   Location of patient (ex: home, work):  HOME  Patient consents to a telephone visit:  YES  Location of the provider (ex: office, home):  OFFICE   Name of any referring provider:  [name], NP  Names of all persons participating in the telemedicine service and their role in the encounter:  PATIENT, [place], [place], DO  Time spent on call:  4:21    Provider: Amai Cappiello L. Mariea Clonts, D.O., C.M.D. PCP:  [name], NP  Goals of Care:  Advanced Directives 04/11/2019  Does Patient Have a Medical Advance Directive? No  Would patient like information on creating a medical advance directive? No - Patient declined   Chief Complaint  Patient presents with  . Acute Visit    Left eye itching, sneezing coughing    HPI: Patient is a 63 y.o. female spoke with by phone today for an acute visit for left eye swelling, itching and running.  She wondered if it was from the antibiotics for her diverticulitis.  It does not hurt.  She's also sneezing and coughing.  No rash, no other parts swelling.  She says that she has not had allergies before.  She does not feel sick at all.  She is almost done with the cipro/flagyl.  Abdominal pain is getting better.  The eye is also red.    Needed her sleeping medicine refilled.  Past Medical History:  Diagnosis Date  . Arthritis    lt knee  . Breast cancer ([place])   . Breast cancer of upper-outer quadrant of left female breast ([place]) 04/02/2016  . Colon polyps   . COPD (chronic obstructive pulmonary disease) ([place])   . Hyperlipidemia   . Hypertension   . Multiple sclerosis ([place])   . Personal history of chemotherapy   . Stroke ([place])    2013, mild cognitive deficits  . Vision abnormalities     Past Surgical History:  Procedure Laterality  Date  . BREAST BIOPSY Left 03/31/2016  . BREAST LUMPECTOMY Left 04/17/2016  . HYSTEROSCOPY W/D&C N/A 12/19/2015   Procedure: DILATATION AND CURETTAGE /HYSTEROSCOPY;  Surgeon: Linda Hedges, DO;  Location: [facility];  Service: Gynecology;  Laterality: N/A;  . RADIOACTIVE SEED GUIDED PARTIAL MASTECTOMY WITH AXILLARY SENTINEL LYMPH NODE BIOPSY Left 04/17/2016   Procedure: RADIOACTIVE SEED GUIDED PARTIAL MASTECTOMY WITH AXILLARY SENTINEL LYMPH NODE BIOPSY;  Surgeon: Erroll Luna, MD;  Location: [facility];  Service: General;  Laterality: Left;  RADIOACTIVE SEED GUIDED PARTIAL MASTECTOMY WITH AXILLARY SENTINEL LYMPH NODE BIOPSY   . TUBAL LIGATION    . VAGINAL DELIVERY     x3    No Known Allergies  Outpatient Encounter Medications as of 04/24/2019  Medication Sig  . anastrozole (ARIMIDEX) 1 MG tablet Take 1 tablet (1 mg total) by mouth daily.  Marland Kitchen aspirin EC 81 MG tablet Take 1 tablet (81 mg total) by mouth daily.  Marland Kitchen atorvastatin (LIPITOR) 20 MG tablet Take 1 tablet (20 mg total) by mouth daily.  . ciprofloxacin (CIPRO) 500 MG tablet Take 1 tablet (500 mg total) by mouth 2 (two) times daily.  . eszopiclone (LUNESTA) 2 MG TABS tablet Take 1 tablet (2 mg total) by mouth at bedtime as needed for sleep. Take immediately before sleep  . labetalol (NORMODYNE) 300 MG tablet TAKE 2  TABLETS BY MOUTH THREE TIMES DAILY  . metroNIDAZOLE (FLAGYL) 500 MG tablet Take 1 tablet (500 mg total) by mouth 3 (three) times daily.  Marland Kitchen NIFEdipine (ADALAT CC) 60 MG 24 hr tablet Take 1 tablet (60 mg total) by mouth 2 (two) times daily.   No facility-administered encounter medications on file as of 04/24/2019.     Review of Systems:  Review of Systems  Constitutional: Negative for chills and fever.  HENT:       Rhinitis  Eyes: Positive for discharge and redness. Negative for blurred vision, double vision, photophobia and pain.       Clear watery discharge, itching, redness of left eye  Respiratory: Positive for  cough. Negative for shortness of breath.        Sneezing  Cardiovascular: Negative for chest pain, palpitations and leg swelling.  Gastrointestinal:       Abdominal pain improving with abx for diverticulitis  Musculoskeletal: Negative for myalgias.  Neurological: Negative for headaches.    Health Maintenance  Topic Date Due  . COLONOSCOPY  07/07/2018  . TETANUS/TDAP  11/17/2019 (Originally 08/03/1974)  . Hepatitis C Screening  12/09/2019 (Originally 05/18/1956)  . HIV Screening  12/09/2019 (Originally 08/03/1970)  . INFLUENZA VACCINE  06/17/2019  . MAMMOGRAM  12/05/2020  . PAP SMEAR-Modifier  05/02/2021    Physical Exam: This portion of visit could not be done due to non face-to-face visit (telehealth)  Labs reviewed: Basic Metabolic Panel: Recent Labs    06/24/18 1045  12/08/18 1351 04/13/19 0912 04/19/19 1033  NA 143   < > 141 140 141  K 3.9   < > 4.3 3.9 4.2  CL 107   < > 103 105 106  CO2 24   < > 29 22 23   GLUCOSE 106*   < > 93 124* 119*  BUN 17   < > 13 12 13   CREATININE 1.53*   < > 1.38* 1.19* 1.20*  CALCIUM 9.5   < > 9.8 9.6 9.9  TSH 1.70  --   --   --   --    < > = values in this interval not displayed.   Liver Function Tests: Recent Labs    06/24/18 1045 10/24/18 1015 04/13/19 0912 04/19/19 1033  AST 19  --  21 21  ALT 17 29 14 18   BILITOT 0.5  --  0.4 0.5  PROT 6.8  --  6.9 6.9   Recent Labs    04/19/19 1033  LIPASE 34  AMYLASE 53   No results for input(s): AMMONIA in the last 8760 hours. CBC: Recent Labs    11/26/18 1320 04/13/19 0912 04/19/19 1033  WBC 8.7 4.4 5.3  NEUTROABS 5.6 2,565 3,726  HGB 12.2 12.6 11.9  HCT 37.3 37.6 36.4  MCV 92.6 91.7 91.7  PLT 280 331 298   Lipid Panel: Recent Labs    06/24/18 1045 10/24/18 1015 04/13/19 0912  CHOL 205* 176 138  HDL 65 65 65  LDLCALC 121* 94 58  TRIG 88 84 74  CHOLHDL 3.2 2.7 2.1   Lab Results  Component Value Date   HGBA1C 5.7 (H) 04/13/2019    Procedures since last  visit: Ct Abdomen Pelvis W Contrast  Result Date: 04/20/2019 CLINICAL DATA:  Left lower quadrant pain EXAM: CT ABDOMEN AND PELVIS WITH CONTRAST TECHNIQUE: Multidetector CT imaging of the abdomen and pelvis was performed using the standard protocol following bolus administration of intravenous contrast. CONTRAST:  131mL ISOVUE-300 IOPAMIDOL (ISOVUE-300) INJECTION 61%  COMPARISON:  None. FINDINGS: Lower chest: Mild cardiomegaly.  No acute abnormality. Hepatobiliary: Diffuse low-density throughout the liver compatible with fatty infiltration. No focal abnormality. Gallbladder unremarkable. Pancreas: No focal abnormality or ductal dilatation. Spleen: No focal abnormality.  Normal size. Adrenals/Urinary Tract: No adrenal abnormality. No focal renal abnormality. No stones or hydronephrosis. Urinary bladder is unremarkable. Stomach/Bowel: Diffuse colonic diverticulosis. This is most pronounced in the distal descending colon and sigmoid colon. No active diverticulitis. Normal appendix. Stomach and small bowel decompressed, unremarkable. Vascular/Lymphatic: Aortic atherosclerosis. No enlarged abdominal or pelvic lymph nodes. Reproductive: Uterus and adnexa unremarkable.  No mass. Other: No free fluid or free air. Musculoskeletal: No acute bony abnormality. IMPRESSION: Diffuse colonic diverticulosis, most pronounced in the left colon. No active diverticulitis. Aortic atherosclerosis. Mild fatty infiltration of the liver. No acute findings. Electronically Signed   By: Rolm Baptise M.D.   On: 04/20/2019 10:19    Assessment/Plan 1. Acute allergic conjunctivitis of left eye -suspect this is what she has given array of symptoms  - tx with zyrtec otc daily for 2 weeks and prednisone dose pack -if no better, contact ophtho for further eval - predniSONE (STERAPRED UNI-PAK 21 TAB) 10 MG (21) TBPK tablet; Use as directed on package  Dispense: 21 tablet; Refill: 0  2. Insomnia, unspecified type - renewed medication -  eszopiclone (LUNESTA) 2 MG TABS tablet; Take 1 tablet (2 mg total) by mouth at bedtime as needed for sleep. Take immediately before sleep  Dispense: 15 tablet; Refill: 1  3. Diverticulosis -clinically had diverticulitis (though imaging did not show active inflammation and wbc normal) so NP treated with abx and now abdominal pain improving -educated pt that reaction to the abx would cause more symmetrical symptoms  Labs/tests ordered:  No new Next appt:  07/12/2019 Non face-to-face time spent on televisit:  25 minutes  Mitchelle Goerner L. Kyana Aicher, D.O. [facility] [address], [place] [address] Cell Phone (Mon-Fri 8am-5pm):  [phone] On Call:  [phone] & follow prompts after 5pm & weekends Office Phone:  [phone] Office Fax:  [phone]

## 2019-04-24 NOTE — Patient Instructions (Addendum)
Zyrtec 10mg  by mouth daily.  This is an antihistamine over-the-counter.  It should help stop the itching, draining and redness. Prednisone taper to completion will help with swelling.    If you do the above and your eye remains red OR if you start having pain in the eye or changes in the vision, please contact your ophthalmologist (eye doctor).  Also, let us know if you are not better.

## 2019-05-31 DIAGNOSIS — Z0100 Encounter for examination of eyes and vision without abnormal findings: Secondary | ICD-10-CM | POA: Diagnosis not present

## 2019-05-31 DIAGNOSIS — H524 Presbyopia: Secondary | ICD-10-CM | POA: Diagnosis not present

## 2019-06-12 ENCOUNTER — Other Ambulatory Visit: Payer: Self-pay | Admitting: Cardiovascular Disease

## 2019-06-12 DIAGNOSIS — I10 Essential (primary) hypertension: Secondary | ICD-10-CM

## 2019-06-16 ENCOUNTER — Other Ambulatory Visit: Payer: Medicare HMO

## 2019-06-21 ENCOUNTER — Ambulatory Visit: Payer: Medicare HMO | Admitting: Nurse Practitioner

## 2019-07-06 ENCOUNTER — Other Ambulatory Visit: Payer: Self-pay | Admitting: Cardiovascular Disease

## 2019-07-06 DIAGNOSIS — I10 Essential (primary) hypertension: Secondary | ICD-10-CM

## 2019-07-10 NOTE — Telephone Encounter (Signed)
Rx(s) sent to pharmacy electronically.  

## 2019-07-12 ENCOUNTER — Ambulatory Visit (INDEPENDENT_AMBULATORY_CARE_PROVIDER_SITE_OTHER): Payer: Medicare HMO | Admitting: Nurse Practitioner

## 2019-07-12 ENCOUNTER — Encounter: Payer: Self-pay | Admitting: Nurse Practitioner

## 2019-07-12 ENCOUNTER — Other Ambulatory Visit: Payer: Self-pay

## 2019-07-12 VITALS — BP 130/78 | HR 75 | Temp 98.5°F | Ht 62.0 in | Wt 196.0 lb

## 2019-07-12 DIAGNOSIS — I69359 Hemiplegia and hemiparesis following cerebral infarction affecting unspecified side: Secondary | ICD-10-CM | POA: Diagnosis not present

## 2019-07-12 DIAGNOSIS — N183 Chronic kidney disease, stage 3 unspecified: Secondary | ICD-10-CM

## 2019-07-12 DIAGNOSIS — G4700 Insomnia, unspecified: Secondary | ICD-10-CM

## 2019-07-12 DIAGNOSIS — I10 Essential (primary) hypertension: Secondary | ICD-10-CM | POA: Diagnosis not present

## 2019-07-12 DIAGNOSIS — I6930 Unspecified sequelae of cerebral infarction: Secondary | ICD-10-CM | POA: Diagnosis not present

## 2019-07-12 DIAGNOSIS — R739 Hyperglycemia, unspecified: Secondary | ICD-10-CM

## 2019-07-12 DIAGNOSIS — E7800 Pure hypercholesterolemia, unspecified: Secondary | ICD-10-CM | POA: Diagnosis not present

## 2019-07-12 DIAGNOSIS — Z23 Encounter for immunization: Secondary | ICD-10-CM

## 2019-07-12 DIAGNOSIS — M109 Gout, unspecified: Secondary | ICD-10-CM | POA: Diagnosis not present

## 2019-07-12 DIAGNOSIS — Z853 Personal history of malignant neoplasm of breast: Secondary | ICD-10-CM

## 2019-07-12 MED ORDER — ESZOPICLONE 2 MG PO TABS: 2.0000 mg | ORAL_TABLET | Freq: Every evening | ORAL | 1 refills | Status: DC | PRN

## 2019-07-12 NOTE — Progress Notes (Signed)
Careteam: Patient Care Team: [name], NP as PCP - General (Geriatric Medicine) Nicholas Lose, MD as Consulting Physician (Oncology) Erroll Luna, MD as Consulting Physician (General Surgery) Thea Silversmith, MD as Consulting Physician (Radiation Oncology)  Advanced Directive information Does Patient Have a Medical Advance Directive?: No, Would patient like information on creating a medical advance directive?: No - Patient declined  No Known Allergies  Chief Complaint  Patient presents with  . Medical Management of Chronic Issues    3 month follow-up   . Quality Metric Gaps    Discuss need for Colonoscopy   . Immunizations    Patient will wait for flu vaccine   . Medication Refill    Renew Lunesta      HPI: Patient is a 63 y.o. female seen in the office today for routine follow up.   Has not gotten colonoscopy - knows she was supposed to follow up with GI but did not go to follow up- seen for acute visit in June which reveal diverticulosis treated, no further symptoms.   Insomnia- controlled on lunesta- does not take every night, just uses occasionally (could use nightly bc she "fights the nights" she does not take)  Gout- no flares, not taking allopurinol. Tried to do diet but "not working" uric acid level 6.8- 3 months ago. No flares.   Hyperlipidemia- switched to Lipitor LDL at goal in may  Hyperglycemia- A1c 5.7 has worked on cutting back on sugar.   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 She completed XRT in June 2018. She was told no chemotx needed. On femara from 04/2017- Dec 2018 and changed to tamoxifen due to hair loss concerns. Arimidex caused hair lossin the past but she is now taking it again. Followed by Dr Lindi Adie.   Hypertension- blood pressure stable on nifedipine and labetolol.   Hx of CVA- continues on ASA daily, no further deficits. Right side mildly weaker than left.      Review of Systems:  Review of Systems  Constitutional: Negative for chills, fever and weight loss.  HENT: Negative for tinnitus.   Respiratory: Negative for cough, sputum production and shortness of breath.   Cardiovascular: Negative for chest pain, palpitations and leg swelling.  Gastrointestinal: Negative for abdominal pain, constipation, diarrhea and heartburn.  Genitourinary: Negative for dysuria, frequency and urgency.  Musculoskeletal: Negative for back pain, falls, joint pain and myalgias.  Skin: Negative.   Neurological: Negative for dizziness and headaches.  Psychiatric/Behavioral: Negative for depression and memory loss. The patient does not have insomnia.     Past Medical History:  Diagnosis Date  . Arthritis    lt knee  . Breast cancer ([place])   . Breast cancer of upper-outer quadrant of left female breast ([place]) 04/02/2016  . Colon polyps   . COPD (chronic obstructive pulmonary disease) ([place])   . Hyperlipidemia   . Hypertension   . Multiple sclerosis ([place])   . Personal history of chemotherapy   . 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 Vision abnormalities    Past Surgical History:  Procedure Laterality Date  . BREAST BIOPSY Left 03/31/2016  . BREAST LUMPECTOMY Left 04/17/2016  . HYSTEROSCOPY W/D&C N/A 12/19/2015   Procedure: DILATATION AND CURETTAGE /HYSTEROSCOPY;  Surgeon: Linda Hedges, DO;  Location: [facility];  Service: Gynecology;  Laterality: N/A;  . RADIOACTIVE SEED GUIDED PARTIAL MASTECTOMY WITH AXILLARY SENTINEL LYMPH NODE BIOPSY Left  04/17/2016   Procedure: RADIOACTIVE SEED GUIDED PARTIAL MASTECTOMY WITH AXILLARY SENTINEL LYMPH NODE BIOPSY;  Surgeon: Erroll Luna, MD;  Location: [facility];  Service: General;  Laterality: Left;  RADIOACTIVE SEED GUIDED PARTIAL MASTECTOMY WITH AXILLARY SENTINEL LYMPH NODE BIOPSY   . TUBAL LIGATION    . VAGINAL DELIVERY     x3   Social History:   reports that she quit smoking about 14 years ago.  Her smoking use included cigarettes. She has a 12.50 pack-year smoking history. She has never used smokeless tobacco. She reports current alcohol use. She reports that she does not use drugs.  Family History  Problem Relation Age of Onset  . Stroke Sister   . Hypertension Mother   . Stroke Mother   . Alcohol abuse Brother   . HIV/AIDS Brother     Medications: Patient's Medications  New Prescriptions   No medications on file  Previous Medications   ANASTROZOLE (ARIMIDEX) 1 MG TABLET    Take 1 tablet (1 mg total) by mouth daily.   ASPIRIN EC 81 MG TABLET    Take 1 tablet (81 mg total) by mouth daily.   ATORVASTATIN (LIPITOR) 20 MG TABLET    Take 1 tablet (20 mg total) by mouth daily.   ESZOPICLONE (LUNESTA) 2 MG TABS TABLET    Take 1 tablet (2 mg total) by mouth at bedtime as needed for sleep. Take immediately before sleep   LABETALOL (NORMODYNE) 300 MG TABLET    Take 2 tablets (600 mg total) by mouth 3 (three) times daily.   NIFEDIPINE (ADALAT CC) 60 MG 24 HR TABLET    Take 1 tablet (60 mg total) by mouth 2 (two) times daily.  Modified Medications   No medications on file  Discontinued Medications   CETIRIZINE (ZYRTEC) 10 MG TABLET    Take 1 tablet (10 mg total) by mouth daily.   CIPROFLOXACIN (CIPRO) 500 MG TABLET    Take 1 tablet (500 mg total) by mouth 2 (two) times daily.   METRONIDAZOLE (FLAGYL) 500 MG TABLET    Take 1 tablet (500 mg total) by mouth 3 (three) times daily.   PREDNISONE (STERAPRED UNI-PAK 21 TAB) 10 MG (21) TBPK TABLET    Use as directed on package    Physical Exam:  Vitals:   07/12/19 0856  BP: 130/78  Pulse: 75  Temp: 98.5 F (36.9 C)  TempSrc: Oral  SpO2: 97%  Weight: 196 lb (88.9 kg)  Height: 5\' 2"  (1.575 m)   Body mass index is 35.85 kg/m. Wt Readings from Last 3 Encounters:  07/12/19 196 lb (88.9 kg)  04/19/19 198 lb 9.6 oz (90.1 kg)  04/11/19 198 lb 9.6 oz (90.1 kg)    Physical Exam Constitutional:      Appearance: Normal appearance. She  is well-developed.  Eyes:     Conjunctiva/sclera: Conjunctivae normal.     Pupils: Pupils are equal, round, and reactive to light.  Neck:     Musculoskeletal: Neck supple.     Thyroid: No thyromegaly.     Trachea: No tracheal deviation.  Cardiovascular:     Rate and Rhythm: Normal rate and regular rhythm.     Heart sounds: Murmur (1/6 SEM) present. No friction rub. No gallop.      Comments: No LE edema b/l. no calf TTP.  Pulmonary:     Effort: Pulmonary effort is normal. No respiratory distress.     Breath sounds: Normal breath sounds. No stridor.  Abdominal:  General: Bowel sounds are normal.     Palpations: Abdomen is soft. Abdomen is not rigid. There is no hepatomegaly.     Comments: obese  Musculoskeletal:     Right lower leg: No edema.     Left lower leg: No edema.  Lymphadenopathy:     Cervical: No cervical adenopathy.  Skin:    General: Skin is warm and dry.     Findings: No rash.  Neurological:     Mental Status: She is alert and oriented to person, place, and time.     Deep Tendon Reflexes: Reflexes are normal and symmetric.  Psychiatric:        Speech: Speech is slurred.        Behavior: Behavior normal.        Thought Content: Thought content normal.        Judgment: Judgment normal.     Comments: Expressive aphasia     Labs reviewed: Basic Metabolic Panel: Recent Labs    12/08/18 1351 04/13/19 0912 04/19/19 1033  NA 141 140 141  K 4.3 3.9 4.2  CL 103 105 106  CO2 29 22 23   GLUCOSE 93 124* 119*  BUN 13 12 13   CREATININE 1.38* 1.19* 1.20*  CALCIUM 9.8 9.6 9.9   Liver Function Tests: Recent Labs    10/24/18 1015 04/13/19 0912 04/19/19 1033  AST  --  21 21  ALT 29 14 18   BILITOT  --  0.4 0.5  PROT  --  6.9 6.9   Recent Labs    04/19/19 1033  LIPASE 34  AMYLASE 53   No results for input(s): AMMONIA in the last 8760 hours. CBC: Recent Labs    11/26/18 1320 04/13/19 0912 04/19/19 1033  WBC 8.7 4.4 5.3  NEUTROABS 5.6 2,565 3,726   HGB 12.2 12.6 11.9  HCT 37.3 37.6 36.4  MCV 92.6 91.7 91.7  PLT 280 331 298   Lipid Panel: Recent Labs    10/24/18 1015 04/13/19 0912  CHOL 176 138  HDL 65 65  LDLCALC 94 58  TRIG 84 74  CHOLHDL 2.7 2.1   TSH: No results for input(s): TSH in the last 8760 hours. A1C: Lab Results  Component Value Date   HGBA1C 5.7 (H) 04/13/2019     Assessment/Plan 1. Insomnia, unspecified type -does well on lunesta. No side effects noted from medication.  - eszopiclone (LUNESTA) 2 MG TABS tablet; Take 1 tablet (2 mg total) by mouth at bedtime as needed for sleep. Take immediately before sleep  Dispense: 30 tablet; Refill: 1  2. Need for influenza vaccination - Flu Vaccine QUAD 6+ mos PF IM (Fluarix Quad PF)  3. History of breast cancer S/p lumpectomy and radiation; on anastrozole, Continues to follow up with oncology.  4. Essential hypertension -stable on current regimen, encouraged to continue current medications with dietary modifications.   5. 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   6. Stage 3 chronic kidney disease ([place]) -Encourage proper hydration and to avoid NSAIDS (Aleve, Advil, Motrin, Ibuprofen)   7. Pure hypercholesterolemia Continues on Lipitor with dietary modifications.   8. Gout, unspecified cause, unspecified chronicity, unspecified site -no recent flares, Uric acid 6.8 on last lab.  9. Hyperglycemia  Continues to work on dietary modifications.   Next appt: 4 months for routine follow up.  Carlos American. Harle Battiest  [facility] [phone]

## 2019-07-12 NOTE — Patient Instructions (Addendum)
Keep up the good work with diet and exercise 30 mins/5 days a week as tolerates   DASH Eating Plan DASH stands for "Dietary Approaches to Stop Hypertension." The DASH eating plan is a healthy eating plan that has been shown to reduce high blood pressure (hypertension). It may also reduce your risk for type 2 diabetes, heart disease, and stroke. The DASH eating plan may also help with weight loss. What are tips for following this plan?  General guidelines  Avoid eating more than 2,300 mg (milligrams) of salt (sodium) a day. If you have hypertension, you may need to reduce your sodium intake to 1,500 mg a day.  Limit alcohol intake to no more than 1 drink a day for nonpregnant women and 2 drinks a day for men. One drink equals 12 oz of beer, 5 oz of wine, or 1 oz of hard liquor.  Work with your health care provider to maintain a healthy body weight or to lose weight. Ask what an ideal weight is for you.  Get at least 30 minutes of exercise that causes your heart to beat faster (aerobic exercise) most days of the week. Activities may include walking, swimming, or biking.  Work with your health care provider or diet and nutrition specialist (dietitian) to adjust your eating plan to your individual calorie needs. Reading food labels   Check food labels for the amount of sodium per serving. Choose foods with less than 5 percent of the Daily Value of sodium. Generally, foods with less than 300 mg of sodium per serving fit into this eating plan.  To find whole grains, look for the word "whole" as the first word in the ingredient list. Shopping  Buy products labeled as "low-sodium" or "no salt added."  Buy fresh foods. Avoid canned foods and premade or frozen meals. Cooking  Avoid adding salt when cooking. Use salt-free seasonings or herbs instead of table salt or sea salt. Check with your health care provider or pharmacist before using salt substitutes.  Do not fry foods. Cook foods using  healthy methods such as baking, boiling, grilling, and broiling instead.  Cook with heart-healthy oils, such as olive, canola, soybean, or sunflower oil. Meal planning  Eat a balanced diet that includes: ? 5 or more servings of fruits and vegetables each day. At each meal, try to fill half of your plate with fruits and vegetables. ? Up to 6-8 servings of whole grains each day. ? Less than 6 oz of lean meat, poultry, or fish each day. A 3-oz serving of meat is about the same size as a deck of cards. One egg equals 1 oz. ? 2 servings of low-fat dairy each day. ? A serving of nuts, seeds, or beans 5 times each week. ? Heart-healthy fats. Healthy fats called Omega-3 fatty acids are found in foods such as flaxseeds and coldwater fish, like sardines, salmon, and mackerel.  Limit how much you eat of the following: ? Canned or prepackaged foods. ? Food that is high in trans fat, such as fried foods. ? Food that is high in saturated fat, such as fatty meat. ? Sweets, desserts, sugary drinks, and other foods with added sugar. ? Full-fat dairy products.  Do not salt foods before eating.  Try to eat at least 2 vegetarian meals each week.  Eat more home-cooked food and less restaurant, buffet, and fast food.  When eating at a restaurant, ask that your food be prepared with less salt or no salt, if possible.  What foods are recommended? The items listed may not be a complete list. Talk with your dietitian about what dietary choices are best for you. Grains Whole-grain or whole-wheat bread. Whole-grain or whole-wheat pasta. Brown rice. [place]. Bulgur. Whole-grain and low-sodium cereals. Pita bread. Low-fat, low-sodium crackers. Whole-wheat flour tortillas. Vegetables Fresh or frozen vegetables (raw, steamed, roasted, or grilled). Low-sodium or reduced-sodium tomato and vegetable juice. Low-sodium or reduced-sodium tomato sauce and tomato paste. Low-sodium or reduced-sodium canned  vegetables. Fruits All fresh, dried, or frozen fruit. Canned fruit in natural juice (without added sugar). Meat and other protein foods Skinless chicken or [place]. Ground chicken or [place]. Pork with fat trimmed off. Fish and seafood. Egg whites. Dried beans, peas, or lentils. Unsalted nuts, nut butters, and seeds. Unsalted canned beans. Lean cuts of beef with fat trimmed off. Low-sodium, lean deli meat. Dairy Low-fat (1%) or fat-free (skim) milk. Fat-free, low-fat, or reduced-fat cheeses. Nonfat, low-sodium ricotta or cottage cheese. Low-fat or nonfat yogurt. Low-fat, low-sodium cheese. Fats and oils Soft margarine without trans fats. Vegetable oil. Low-fat, reduced-fat, or light mayonnaise and salad dressings (reduced-sodium). Canola, safflower, olive, soybean, and sunflower oils. Avocado. Seasoning and other foods Herbs. Spices. Seasoning mixes without salt. Unsalted popcorn and pretzels. Fat-free sweets. What foods are not recommended? The items listed may not be a complete list. Talk with your dietitian about what dietary choices are best for you. Grains Baked goods made with fat, such as croissants, muffins, or some breads. Dry pasta or rice meal packs. Vegetables Creamed or fried vegetables. Vegetables in a cheese sauce. Regular canned vegetables (not low-sodium or reduced-sodium). Regular canned tomato sauce and paste (not low-sodium or reduced-sodium). Regular tomato and vegetable juice (not low-sodium or reduced-sodium). Angie Fava. Olives. Fruits Canned fruit in a light or heavy syrup. Fried fruit. Fruit in cream or butter sauce. Meat and other protein foods Fatty cuts of meat. Ribs. Fried meat. Berniece Salines. Sausage. Bologna and other processed lunch meats. Salami. Fatback. Hotdogs. Bratwurst. Salted nuts and seeds. Canned beans with added salt. Canned or smoked fish. Whole eggs or egg yolks. Chicken or [place] with skin. Dairy Whole or 2% milk, cream, and half-and-half. Whole or full-fat  cream cheese. Whole-fat or sweetened yogurt. Full-fat cheese. Nondairy creamers. Whipped toppings. Processed cheese and cheese spreads. Fats and oils Butter. Stick margarine. Lard. Shortening. Ghee. Bacon fat. Tropical oils, such as coconut, palm kernel, or palm oil. Seasoning and other foods Salted popcorn and pretzels. Onion salt, garlic salt, seasoned salt, table salt, and sea salt. Worcestershire sauce. Tartar sauce. Barbecue sauce. Teriyaki sauce. Soy sauce, including reduced-sodium. Steak sauce. Canned and packaged gravies. Fish sauce. Oyster sauce. Cocktail sauce. Horseradish that you find on the shelf. Ketchup. Mustard. Meat flavorings and tenderizers. Bouillon cubes. Hot sauce and Tabasco sauce. Premade or packaged marinades. Premade or packaged taco seasonings. Relishes. Regular salad dressings. Where to find more information:  National Heart, Lung, and [place]: https://wilson-eaton.com/  American Heart Association: www.heart.org Summary  The DASH eating plan is a healthy eating plan that has been shown to reduce high blood pressure (hypertension). It may also reduce your risk for type 2 diabetes, heart disease, and stroke.  With the DASH eating plan, you should limit salt (sodium) intake to 2,300 mg a day. If you have hypertension, you may need to reduce your sodium intake to 1,500 mg a day.  When on the DASH eating plan, aim to eat more fresh fruits and vegetables, whole grains, lean proteins, low-fat dairy, and heart-healthy fats.  Work  with your health care provider or diet and nutrition specialist (dietitian) to adjust your eating plan to your individual calorie needs. This information is not intended to replace advice given to you by your health care provider. Make sure you discuss any questions you have with your health care provider. Document Released: 10/22/2011 Document Revised: 10/15/2017 Document Reviewed: 10/26/2016 Elsevier Patient Education  2020 [employer].

## 2019-07-31 ENCOUNTER — Other Ambulatory Visit: Payer: Self-pay | Admitting: Cardiovascular Disease

## 2019-08-15 ENCOUNTER — Encounter: Payer: Self-pay | Admitting: Gynecology

## 2019-08-28 ENCOUNTER — Other Ambulatory Visit: Payer: Self-pay | Admitting: Nurse Practitioner

## 2019-08-29 ENCOUNTER — Telehealth: Payer: Self-pay

## 2019-08-29 NOTE — Telephone Encounter (Signed)
Called patient and informed her of provider response 

## 2019-08-29 NOTE — Telephone Encounter (Signed)
Patient requesting an appointment with provider. She states she has not been recently tested or been around any one that has been tested. She denied having fever, chills, shortness of breath, diarrhea, nausea and vomiting, loss of taste or smell. She states for the past 2 weeks she has been dealing with left eye drainage and feeling like there is dirt in eye, a runny nose, and scratching throat. Scheduled patient with a visit on Friday with provider. Routing to provider for review if patient can come in for visit or needs to be telephone visit.

## 2019-08-29 NOTE — Telephone Encounter (Signed)
This is fine for her to be in office

## 2019-09-01 ENCOUNTER — Ambulatory Visit (INDEPENDENT_AMBULATORY_CARE_PROVIDER_SITE_OTHER): Payer: Medicare HMO | Admitting: Nurse Practitioner

## 2019-09-01 ENCOUNTER — Encounter: Payer: Self-pay | Admitting: Nurse Practitioner

## 2019-09-01 ENCOUNTER — Other Ambulatory Visit: Payer: Self-pay

## 2019-09-01 VITALS — BP 142/76 | HR 76 | Temp 97.7°F | Ht 62.0 in | Wt 199.6 lb

## 2019-09-01 DIAGNOSIS — J302 Other seasonal allergic rhinitis: Secondary | ICD-10-CM | POA: Diagnosis not present

## 2019-09-01 DIAGNOSIS — M791 Myalgia, unspecified site: Secondary | ICD-10-CM | POA: Diagnosis not present

## 2019-09-01 MED ORDER — OLOPATADINE HCL 0.1 % OP SOLN: 1.0000 [drp] | Freq: Two times a day (BID) | OPHTHALMIC | 2 refills | Status: DC

## 2019-09-01 MED ORDER — CETIRIZINE HCL 10 MG PO TABS: 10.0000 mg | ORAL_TABLET | Freq: Every day | ORAL | 11 refills | Status: DC

## 2019-09-01 NOTE — Patient Instructions (Addendum)
To use zyrtec (cetirizine) 10 mg by mouth daily as needed for allergies  To use pataday 1 drop into left eye twice daily   Allergic Rhinitis, Adult Allergic rhinitis is a reaction to allergens in the air. Allergens are tiny specks (particles) in the air that cause your body to have an allergic reaction. This condition cannot be passed from person to person (is not contagious). Allergic rhinitis cannot be cured, but it can be controlled. There are two types of allergic rhinitis:  Seasonal. This type is also called hay fever. It happens only during certain times of the year.  Perennial. This type can happen at any time of the year. What are the causes? This condition may be caused by:  Pollen from grasses, trees, and weeds.  House dust mites.  Pet dander.  Mold. What are the signs or symptoms? Symptoms of this condition include:  Sneezing.  Runny or stuffy nose (nasal congestion).  A lot of mucus in the back of the throat (postnasal drip).  Itchy nose.  Tearing of the eyes.  Trouble sleeping.  Being sleepy during day. How is this treated? There is no cure for this condition. You should avoid things that trigger your symptoms (allergens). Treatment can help to relieve symptoms. This may include:  Medicines that block allergy symptoms, such as antihistamines. These may be given as a shot, nasal spray, or pill.  Shots that are given until your body becomes less sensitive to the allergen (desensitization).  Stronger medicines, if all other treatments have not worked. Follow these instructions at home: Avoiding allergens   Find out what you are allergic to. Common allergens include smoke, dust, and pollen.  Avoid them if you can. These are some of the things that you can do to avoid allergens: ? Replace carpet with wood, tile, or vinyl flooring. Carpet can trap dander and dust. ? Clean any mold found in the home. ? Do not smoke. Do not allow smoking in your  home. ? Change your heating and air conditioning filter at least once a month. ? During allergy season:  Keep windows closed as much as you can. If possible, use air conditioning when there is a lot of pollen in the air.  Use a special filter for allergies with your furnace and air conditioner.  Plan outdoor activities when pollen counts are lowest. This is usually during the early morning or evening hours.  If you do go outdoors when pollen count is high, wear a special mask for people with allergies.  When you come indoors, take a shower and change your clothes before sitting on furniture or bedding. General instructions  Do not use fans in your home.  Do not hang clothes outside to dry.  Wear sunglasses to keep pollen out of your eyes.  Wash your hands right away after you touch household pets.  Take over-the-counter and prescription medicines only as told by your doctor.  Keep all follow-up visits as told by your doctor. This is important. Contact a doctor if:  You have a fever.  You have a cough that does not go away (is persistent).  You start to make whistling sounds when you breathe (wheeze).  Your symptoms do not get better with treatment.  You have thick fluid coming from your nose.  You start to have nosebleeds. Get help right away if:  Your tongue or your lips are swollen.  You have trouble breathing.  You feel dizzy or you feel like you are going  to pass out (faint).  You have cold sweats. Summary  Allergic rhinitis is a reaction to allergens in the air.  This condition may be caused by allergens. These include pollen, dust mites, pet dander, and mold.  Symptoms include a runny, itchy nose, sneezing, or tearing eyes. You may also have trouble sleeping or feel sleepy during the day.  Treatment includes taking medicines and avoiding allergens. You may also get shots or take stronger medicines.  Get help if you have a fever or a cough that does not  stop. Get help right away if you are short of breath. This information is not intended to replace advice given to you by your health care provider. Make sure you discuss any questions you have with your health care provider. Document Released: 03/04/2011 Document Revised: 02/21/2019 Document Reviewed: 05/24/2018 Elsevier Patient Education  2020 [employer].

## 2019-09-01 NOTE — Progress Notes (Signed)
Careteam: Patient Care Team: [name], NP as PCP - General (Geriatric Medicine) Nicholas Lose, MD as Consulting Physician (Oncology) Erroll Luna, MD as Consulting Physician (General Surgery) Thea Silversmith, MD as Consulting Physician (Radiation Oncology)  Advanced Directive information    No Known Allergies  Chief Complaint  Patient presents with  . Acute Visit    Left eye drainage and runny nose x 2 week. Scratchy thorat is resolved. Patient also c/o left upper arm concerns.      HPI: Patient is a 63 y.o. female seen in the office today due to left eye drainage. For the past 2 weeks off and on she has noted tearing from left eye. Clear thin drainage.  No scratchy, reports nasal congestion and runny nose.  Has not tired any medication.  Family is telling her she has allergies.  Has not been outside more than normal.   Also has been moving a lot of furniture at work. Now has a funny sensation to her upper arm. Bicep region. No weakness or deformity of muscle.   Review of Systems:  Review of Systems  Constitutional: Negative for chills and fever.  HENT: Positive for congestion (mild). Negative for ear discharge, ear pain, hearing loss, sinus pain and sore throat.   Eyes: Positive for discharge (watery).  Respiratory: Negative for cough, shortness of breath and wheezing.   Cardiovascular: Negative for chest pain, palpitations and leg swelling.  Musculoskeletal: Positive for myalgias. Negative for back pain, falls, joint pain and neck pain.    Past Medical History:  Diagnosis Date  . Arthritis    lt knee  . Breast cancer ([place])   . Breast cancer of upper-outer quadrant of left female breast ([place]) 04/02/2016  . Colon polyps   . COPD (chronic obstructive pulmonary disease) ([place])   . Hyperlipidemia   . Hypertension   . Multiple sclerosis ([place])   . Personal history of chemotherapy   . Stroke ([place])    2013, mild cognitive deficits  . Vision abnormalities     Past Surgical History:  Procedure Laterality Date  . BREAST BIOPSY Left 03/31/2016  . BREAST LUMPECTOMY Left 04/17/2016  . HYSTEROSCOPY W/D&C N/A 12/19/2015   Procedure: DILATATION AND CURETTAGE /HYSTEROSCOPY;  Surgeon: Linda Hedges, DO;  Location: [facility];  Service: Gynecology;  Laterality: N/A;  . RADIOACTIVE SEED GUIDED PARTIAL MASTECTOMY WITH AXILLARY SENTINEL LYMPH NODE BIOPSY Left 04/17/2016   Procedure: RADIOACTIVE SEED GUIDED PARTIAL MASTECTOMY WITH AXILLARY SENTINEL LYMPH NODE BIOPSY;  Surgeon: Erroll Luna, MD;  Location: [facility];  Service: General;  Laterality: Left;  RADIOACTIVE SEED GUIDED PARTIAL MASTECTOMY WITH AXILLARY SENTINEL LYMPH NODE BIOPSY   . TUBAL LIGATION    . VAGINAL DELIVERY     x3   Social History:   reports that she quit smoking about 14 years ago. Her smoking use included cigarettes. She has a 12.50 pack-year smoking history. She has never used smokeless tobacco. She reports current alcohol use. She reports that she does not use drugs.  Family History  Problem Relation Age of Onset  . Stroke Sister   . Hypertension Mother   . Stroke Mother   . Alcohol abuse Brother   . HIV/AIDS Brother     Medications: Patient's Medications  New Prescriptions   No medications on file  Previous Medications   ANASTROZOLE (ARIMIDEX) 1 MG TABLET    Take 1 tablet (1 mg total) by mouth daily.   ASPIRIN EC 81 MG TABLET  Take 1 tablet (81 mg total) by mouth daily.   ATORVASTATIN (LIPITOR) 20 MG TABLET    Take 1 tablet (20 mg total) by mouth daily.   ESZOPICLONE (LUNESTA) 2 MG TABS TABLET    Take 1 tablet (2 mg total) by mouth at bedtime as needed for sleep. Take immediately before sleep   LABETALOL (NORMODYNE) 300 MG TABLET    Take 2 tablets (600 mg total) by mouth 3 (three) times daily.   NIFEDIPINE (ADALAT CC) 60 MG 24 HR TABLET    Take 1 tablet by mouth twice daily  Modified Medications   No medications on file  Discontinued Medications   No  medications on file    Physical Exam:  Vitals:   09/01/19 1358  BP: (!) 142/76  Pulse: 76  Temp: 97.7 F (36.5 C)  TempSrc: Temporal  SpO2: 97%  Weight: 199 lb 9.6 oz (90.5 kg)  Height: 5\' 2"  (1.575 m)   Body mass index is 36.51 kg/m. Wt Readings from Last 3 Encounters:  09/01/19 199 lb 9.6 oz (90.5 kg)  07/12/19 196 lb (88.9 kg)  04/19/19 198 lb 9.6 oz (90.1 kg)    Physical Exam Constitutional:      Appearance: Normal appearance. She is obese. She is not ill-appearing.  HENT:     Head: Normocephalic and atraumatic.     Right Ear: Tympanic membrane, ear canal and external ear normal.     Left Ear: Tympanic membrane, ear canal and external ear normal.     Nose: Congestion and rhinorrhea present.  Eyes:     General:        Right eye: No discharge.        Left eye: No discharge.     Extraocular Movements: Extraocular movements intact.     Conjunctiva/sclera: Conjunctivae normal.     Pupils: Pupils are equal, round, and reactive to light.  Neck:     Musculoskeletal: Normal range of motion and neck supple.  Cardiovascular:     Rate and Rhythm: Normal rate.     Pulses: Normal pulses.  Pulmonary:     Effort: Pulmonary effort is normal.     Breath sounds: Normal breath sounds.  Musculoskeletal:        General: No swelling, tenderness, deformity or signs of injury.     Right lower leg: No edema.     Left lower leg: No edema.  Skin:    General: Skin is warm and dry.  Neurological:     Mental Status: She is alert.     Labs reviewed: Basic Metabolic Panel: Recent Labs    12/08/18 1351 04/13/19 0912 04/19/19 1033  NA 141 140 141  K 4.3 3.9 4.2  CL 103 105 106  CO2 29 22 23   GLUCOSE 93 124* 119*  BUN 13 12 13   CREATININE 1.38* 1.19* 1.20*  CALCIUM 9.8 9.6 9.9   Liver Function Tests: Recent Labs    10/24/18 1015 04/13/19 0912 04/19/19 1033  AST  --  21 21  ALT 29 14 18   BILITOT  --  0.4 0.5  PROT  --  6.9 6.9   Recent Labs    04/19/19 1033   LIPASE 34  AMYLASE 53   No results for input(s): AMMONIA in the last 8760 hours. CBC: Recent Labs    11/26/18 1320 04/13/19 0912 04/19/19 1033  WBC 8.7 4.4 5.3  NEUTROABS 5.6 2,565 3,726  HGB 12.2 12.6 11.9  HCT 37.3 37.6 36.4  MCV 92.6 91.7  91.7  PLT 280 331 298   Lipid Panel: Recent Labs    10/24/18 1015 04/13/19 0912  CHOL 176 138  HDL 65 65  LDLCALC 94 58  TRIG 84 74  CHOLHDL 2.7 2.1   TSH: No results for input(s): TSH in the last 8760 hours. A1C: Lab Results  Component Value Date   HGBA1C 5.7 (H) 04/13/2019     Assessment/Plan 1. Seasonal allergies - cetirizine (ZYRTEC) 10 MG tablet; Take 1 tablet (10 mg total) by mouth daily.  Dispense: 30 tablet; Refill: 11 - olopatadine (PATADAY) 0.1 % ophthalmic solution; Place 1 drop into the left eye 2 (two) times daily.  Dispense: 5 mL; Refill: 2  2. Muscle soreness To left bicep after moving furniture, good ROM without pain, swelling deformity or bruising.  Reassurance given.   Next appt: 11/13/2019 as scheduled, sooner if needed   K. Delcambre, [facility] [phone]

## 2019-09-18 ENCOUNTER — Other Ambulatory Visit: Payer: Self-pay

## 2019-09-18 DIAGNOSIS — G4700 Insomnia, unspecified: Secondary | ICD-10-CM

## 2019-09-18 NOTE — Telephone Encounter (Signed)
Patient requesting a refill on Lunesta.  Last filled on 07/12/19, #90 x1 refill. Last office visit 09/01/19.

## 2019-09-21 ENCOUNTER — Other Ambulatory Visit: Payer: Self-pay | Admitting: Nurse Practitioner

## 2019-09-21 DIAGNOSIS — E7800 Pure hypercholesterolemia, unspecified: Secondary | ICD-10-CM

## 2019-09-21 DIAGNOSIS — G4700 Insomnia, unspecified: Secondary | ICD-10-CM

## 2019-09-21 NOTE — Telephone Encounter (Signed)
Controlled medication.

## 2019-09-29 ENCOUNTER — Ambulatory Visit (INDEPENDENT_AMBULATORY_CARE_PROVIDER_SITE_OTHER): Payer: Medicare HMO | Admitting: Cardiovascular Disease

## 2019-09-29 ENCOUNTER — Encounter: Payer: Self-pay | Admitting: Cardiovascular Disease

## 2019-09-29 ENCOUNTER — Other Ambulatory Visit: Payer: Self-pay

## 2019-09-29 VITALS — BP 150/82 | HR 75 | Ht 62.0 in | Wt 192.0 lb

## 2019-09-29 DIAGNOSIS — I10 Essential (primary) hypertension: Secondary | ICD-10-CM | POA: Diagnosis not present

## 2019-09-29 DIAGNOSIS — E782 Mixed hyperlipidemia: Secondary | ICD-10-CM

## 2019-09-29 NOTE — Assessment & Plan Note (Signed)
History of hyperlipidemia on statin therapy with lipid profile performed 04/05/2019 revealing total cholesterol 138, LDL 58 and HDL 65.

## 2019-09-29 NOTE — Patient Instructions (Addendum)

## 2019-09-29 NOTE — Progress Notes (Signed)
09/29/2019 INTA SUSKI   06/28/1956  [id number]  Primary Physician [name], NP Primary Cardiologist: Lorretta Harp MD Renae Gloss  HPI:  STEFENIE VEST is a 63 y.o.  moderately overweight married African-American female mother of 3 children, grandmother of 19 grandchildren who relocated from [place] to [place] 5 years ago. She is retired from working in her own daycare.I last saw her in the office 5/15/2018She is a history of hypertension and hypokalemia. She's had a stroke 4 years ago leaving her with some motor and cognitive deficit deficits. She's never had a heart attack. She denies chest pain or shortness of breath. She wasreferred here for further management of her hypertension.Since I saw her a year ago her blood pressure has remained well controlled on her current medical regimen.  Since I saw her in the office a year ago she has done well.  She is completely asymptomatic denying chest pain or shortness of breath.   Current Meds  Medication Sig  . anastrozole (ARIMIDEX) 1 MG tablet Take 1 tablet (1 mg total) by mouth daily.  Marland Kitchen aspirin EC 81 MG tablet Take 1 tablet (81 mg total) by mouth daily.  Marland Kitchen atorvastatin (LIPITOR) 20 MG tablet Take 1 tablet by mouth once daily  . labetalol (NORMODYNE) 300 MG tablet Take 2 tablets (600 mg total) by mouth 3 (three) times daily.  Marland Kitchen NIFEdipine (ADALAT CC) 60 MG 24 hr tablet Take 1 tablet by mouth twice daily     No Known Allergies  Social History   Socioeconomic History  . Marital status: Married    Spouse name: Not on file  . Number of children: 3  . Years of education: Not on file  . Highest education level: Not on file  Occupational History  . Occupation: retired  Scientific laboratory technician  . Financial resource strain: Not hard at all  . Food insecurity    Worry: Never true    Inability: Never true  . Transportation needs    Medical: No    Non-medical: No  Tobacco Use  . Smoking status: Former  Smoker    Packs/day: 0.50    Years: 25.00    Pack years: 12.50    Types: Cigarettes    Quit date: 11/16/2004    Years since quitting: 14.8  . Smokeless tobacco: Never Used  Substance and Sexual Activity  . Alcohol use: Yes    Alcohol/week: 0.0 standard drinks    Comment: occas  . Drug use: No  . Sexual activity: Yes    Comment: 1st intercourse 25 yo-5 partners  Lifestyle  . Physical activity    Days per week: 1 day    Minutes per session: 90 min  . Stress: Only a little  Relationships  . Social Herbalist on phone: Twice a week    Gets together: Once a week    Attends religious service: More than 4 times per year    Active member of club or organization: No    Attends meetings of clubs or organizations: Never    Relationship status: Married  . Intimate partner violence    Fear of current or ex partner: No    Emotionally abused: No    Physically abused: No    Forced sexual activity: No  Other Topics Concern  . Not on file  Social History Narrative   Diet- N/A   Caffeine- Yes   Married- Yes   House-  2 story with 2 people   Pets- No   Current/past profession- Nurse, mental health   Exercise- Yes   Living will-No   DNR-N/A   POA/HPOA-No        Review of Systems: General: negative for chills, fever, night sweats or weight changes.  Cardiovascular: negative for chest pain, dyspnea on exertion, edema, orthopnea, palpitations, paroxysmal nocturnal dyspnea or shortness of breath Dermatological: negative for rash Respiratory: negative for cough or wheezing Urologic: negative for hematuria Abdominal: negative for nausea, vomiting, diarrhea, bright red blood per rectum, melena, or hematemesis Neurologic: negative for visual changes, syncope, or dizziness All other systems reviewed and are otherwise negative except as noted above.    Blood pressure (!) 150/82, pulse 75, height 5\' 2"  (1.575 m), weight 192 lb (87.1 kg).  General appearance: alert and no distress  Neck: no adenopathy, no carotid bruit, no JVD, supple, symmetrical, trachea midline and thyroid not enlarged, symmetric, no tenderness/mass/nodules Lungs: clear to auscultation bilaterally Heart: regular rate and rhythm, S1, S2 normal, no murmur, click, rub or gallop Extremities: extremities normal, atraumatic, no cyanosis or edema Pulses: 2+ and symmetric Skin: Skin color, texture, turgor normal. No rashes or lesions Neurologic: Alert and oriented X 3, normal strength and tone. Normal symmetric reflexes. Normal coordination and gait  EKG sinus rhythm at 75 without ST or T wave changes. I  Personally reviewed this EKG.  ASSESSMENT AND PLAN:   Essential hypertension History of essential hypertension with blood pressure measured today 150/82.  She did not take her hypertensive medications this morning and is on labetalol and nifedipine.  She checks her blood pressure at home and it usually runs in the 122/80 range.  Hyperlipidemia History of hyperlipidemia on statin therapy with lipid profile performed 04/05/2019 revealing total cholesterol 138, LDL 58 and HDL 65.      Lorretta Harp MD [facility], [facility] 09/29/2019 9:10 AM

## 2019-09-29 NOTE — Assessment & Plan Note (Signed)
History of essential hypertension with blood pressure measured today 150/82.  She did not take her hypertensive medications this morning and is on labetalol and nifedipine.  She checks her blood pressure at home and it usually runs in the 122/80 range.

## 2019-11-03 ENCOUNTER — Other Ambulatory Visit: Payer: Self-pay | Admitting: Hematology and Oncology

## 2019-11-03 DIAGNOSIS — Z853 Personal history of malignant neoplasm of breast: Secondary | ICD-10-CM

## 2019-11-05 NOTE — Progress Notes (Signed)
Patient Care Team: [name], NP as PCP - General (Geriatric Medicine) Nicholas Lose, MD as Consulting Physician (Oncology) Erroll Luna, MD as Consulting Physician (General Surgery) Thea Silversmith, MD as Consulting Physician (Radiation Oncology)  DIAGNOSIS:    ICD-10-CM   1. Malignant neoplasm of upper-outer quadrant of left breast in female, estrogen receptor positive ([place])  C50.412    Z17.0     SUMMARY OF ONCOLOGIC HISTORY: Oncology History  Breast cancer of upper-outer quadrant of left female breast ([place])  03/31/2016 Initial Diagnosis   Screening detected left breast mass, 2 nodules, 1.9 x 1.6 x 0.8 cm= fat necrosis; 8 x 7 x 7 mm= grade 2 IDC ER 100%, PR 100%, HER-2 negative ratio 1.39, Ki-67 20%   04/17/2016 Surgery   Left lumpectomy ([name]): IDC grade 2, 1.3 cm, with DCIS, margins negative, 0/4 lymph nodes negative, T1 cN0 stage IA pathologic stage, ER 100%, PR 100%, HER-2 negative ratio 1.39, Ki-67 20% Oncotype DX score 20, 13% ROR, intermediate risk   04/17/2016 Oncotype testing   Recurrence score: 20; ROR 15% (intermediate risk)    05/27/2016 - 07/13/2016 Radiation Therapy   Adjuvant radiation therapy [facility]): Left breast treated with breath hold to 50.4 Gy in 28 fractions at 1.8 Gy/fraction.  Left breast boosted to 10 Gy in 5 fractions at 2 Gy/fraction   07/23/2016 -  Anti-estrogen oral therapy   Anastrozole 1 mg switched to letrozole 04/22/2017 due to hair loss, switch to tamoxifen 10/22/2017 due to hair loss from letrozole as well     CHIEF COMPLIANT: Follow-up of left breast cancer on anastrozole  INTERVAL HISTORY: Michele Page is a 63 y.o. with above-mentioned history of left breast cancer treated with lumpectomy, radiation, and who is currently on anastrozole. Mammogram on 12/05/18 showed no evidence of malignancy bilaterally. She presents to the clinic today for annual follow-up.  She complains of the breast feels sore.  REVIEW OF SYSTEMS:     Constitutional: Denies fevers, chills or abnormal weight loss Eyes: Denies blurriness of vision Ears, nose, mouth, throat, and face: Denies mucositis or sore throat Respiratory: Denies cough, dyspnea or wheezes Cardiovascular: Denies palpitation, chest discomfort Gastrointestinal: Denies nausea, heartburn or change in bowel habits Skin: Denies abnormal skin rashes Lymphatics: Denies new lymphadenopathy or easy bruising Neurological: Denies numbness, tingling or new weaknesses Behavioral/Psych: Mood is stable, no new changes  Extremities: No lower extremity edema Breast: denies any pain or lumps or nodules in either breasts All other systems were reviewed with the patient and are negative.  I have reviewed the past medical history, past surgical history, social history and family history with the patient and they are unchanged from previous note.  ALLERGIES:  has No Known Allergies.  MEDICATIONS:  Current Outpatient Medications  Medication Sig Dispense Refill  . anastrozole (ARIMIDEX) 1 MG tablet Take 1 tablet (1 mg total) by mouth daily. 90 tablet 3  . aspirin EC 81 MG tablet Take 1 tablet (81 mg total) by mouth daily. 30 tablet 6  . atorvastatin (LIPITOR) 20 MG tablet Take 1 tablet by mouth once daily 90 tablet 0  . labetalol (NORMODYNE) 300 MG tablet Take 2 tablets (600 mg total) by mouth 3 (three) times daily. 540 tablet 0  . NIFEdipine (ADALAT CC) 60 MG 24 hr tablet Take 1 tablet by mouth twice daily 60 tablet 5   No current facility-administered medications for this visit.    PHYSICAL EXAMINATION: ECOG PERFORMANCE STATUS: 1 - Symptomatic but completely ambulatory  Vitals:  11/06/19 0845  BP: (!) 160/82  Pulse: 73  Resp: 18  Temp: 98.1 F (36.7 C)  SpO2: 100%   Filed Weights   11/06/19 0845  Weight: 200 lb 11.2 oz (91 kg)    GENERAL: alert, no distress and comfortable SKIN: skin color, texture, turgor are normal, no rashes or significant lesions EYES: normal,  Conjunctiva are pink and non-injected, sclera clear OROPHARYNX: no exudate, no erythema and lips, buccal mucosa, and tongue normal  NECK: supple, thyroid normal size, non-tender, without nodularity LYMPH: no palpable lymphadenopathy in the cervical, axillary or inguinal LUNGS: clear to auscultation and percussion with normal breathing effort HEART: regular rate & rhythm and no murmurs and no lower extremity edema ABDOMEN: abdomen soft, non-tender and normal bowel sounds MUSCULOSKELETAL: no cyanosis of digits and no clubbing  NEURO: alert & oriented x 3 with fluent speech, no focal motor/sensory deficits EXTREMITIES: No lower extremity edema BREAST: No palpable masses or nodules in either right or left breasts. No palpable axillary supraclavicular or infraclavicular adenopathy no breast tenderness or nipple discharge. (exam performed in the presence of a chaperone)  LABORATORY DATA:  I have reviewed the data as listed CMP Latest Ref Rng & Units 04/19/2019 04/13/2019 12/08/2018  Glucose 65 - 99 mg/dL 119(H) 124(H) 93  BUN 7 - 25 mg/dL _0 Creatinine 0.50 - 0.99 mg/dL 1.20(H) 1.19(H) 1.38(H)  Sodium 135 - 146 mmol/L 141 140 141  Potassium 3.5 - 5.3 mmol/L 4.2 3.9 4.3  Chloride 98 - 110 mmol/L 106 105 103  CO2 20 - 32 mmol/L _1 Calcium 8.6 - 10.4 mg/dL 9.9 9.6 9.8  Total Protein 6.1 - 8.1 g/dL 6.9 6.9 -  Total Bilirubin 0.2 - 1.2 mg/dL 0.5 0.4 -  Alkaline Phos 33 - 130 U/L - - -  AST 10 - 35 U/L 21 21 -  ALT 6 - 29 U/L 18 14 -    Lab Results  Component Value Date   WBC 5.3 04/19/2019   HGB 11.9 04/19/2019   HCT 36.4 04/19/2019   MCV 91.7 04/19/2019   PLT 298 04/19/2019   NEUTROABS 3,726 04/19/2019    ASSESSMENT & PLAN:  Breast cancer of upper-outer quadrant of left female breast ([place]) 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, 1.3 cm, with DCIS, margins negative, 0/4 lymph nodes negative, T1 cN0 stage IA pathologic stage, ER 100%, PR 100%, HER-2 negative ratio 1.39, Ki-67  20% (Originally 2 nodules were detected on screening mammogram 1.9 cm= fat necrosis and 8 mm IDC) Oncotype DX score 20, 13% risk of recurrence, intermediate risk Adjuvant radiation therapy started 07/20/2017completed 07/13/2016  Current treatment: Adjuvant antiestrogen therapy withanastrozole started 07/23/2016 but switched toletrozolein June 2018 due to hair loss, switched to tamoxifen 10/22/2017 due to continuing hair loss with letrozole, switched back to anastrozole  Anastrozole toxicities: Denies any hot flashes or myalgias.  Hair loss: Patient has having extraordinary amount of emotional suffering from the hair loss that she is experiencing She attributes the hair loss to pravastatin.  Surveillance: 1. Breast exam 11/06/2019: Benign 2. 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  The patient has a good understanding of the overall plan. she agrees with it. she will call with any problems that may develop before the next visit here.  Nicholas Lose, MD 11/06/2019  Julious Oka Dorshimer, am acting as scribe for Dr. Nicholas Lose.  I have reviewed the above document for accuracy and completeness, and I agree with the above.

## 2019-11-06 ENCOUNTER — Other Ambulatory Visit: Payer: Self-pay

## 2019-11-06 ENCOUNTER — Inpatient Hospital Stay: Payer: Medicare HMO | Attending: Hematology and Oncology | Admitting: Hematology and Oncology

## 2019-11-06 DIAGNOSIS — Z79899 Other long term (current) drug therapy: Secondary | ICD-10-CM | POA: Diagnosis not present

## 2019-11-06 DIAGNOSIS — K5730 Diverticulosis of large intestine without perforation or abscess without bleeding: Secondary | ICD-10-CM | POA: Insufficient documentation

## 2019-11-06 DIAGNOSIS — Z853 Personal history of malignant neoplasm of breast: Secondary | ICD-10-CM

## 2019-11-06 DIAGNOSIS — C50412 Malignant neoplasm of upper-outer quadrant of left female breast: Secondary | ICD-10-CM | POA: Diagnosis not present

## 2019-11-06 DIAGNOSIS — Z170 Estrogen receptor positive status [ER+]: Secondary | ICD-10-CM | POA: Diagnosis not present

## 2019-11-06 DIAGNOSIS — Z79811 Long term (current) use of aromatase inhibitors: Secondary | ICD-10-CM | POA: Insufficient documentation

## 2019-11-06 MED ORDER — ANASTROZOLE 1 MG PO TABS: 1.0000 mg | ORAL_TABLET | Freq: Every day | ORAL | 3 refills | Status: DC

## 2019-11-06 NOTE — Assessment & Plan Note (Signed)
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, 1.3 cm, with DCIS, margins negative, 0/4 lymph nodes negative, T1 cN0 stage IA pathologic stage, ER 100%, PR 100%, HER-2 negative ratio 1.39, Ki-67 20% (Originally 2 nodules were detected on screening mammogram 1.9 cm= fat necrosis and 8 mm IDC) Oncotype DX score 20, 13% risk of recurrence, intermediate risk Adjuvant radiation therapy started 07/20/2017completed 07/13/2016  Current treatment: Adjuvant antiestrogen therapy withanastrozole started 07/23/2016 but switched toletrozolein June 2018 due to hair loss, switched to tamoxifen 10/22/2017 due to continuing hair loss with letrozole, switched back to anastrozole  Anastrozole toxicities: Denies any hot flashes or myalgias.  Hair loss: Patient has having extraordinary amount of emotional suffering from the hair loss that she is experiencing She attributes the hair loss to pravastatin.  Surveillance: 1. Breast exam 11/06/2019: Benign 2. 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  Right cerebellar chronic cerebral microhemorrhage no acute findings no change from MRI 02/27/2017  CT abdomen 04/20/2019: Diffuse colonic diverticulosis  Return to clinic in 1 year for follow-up

## 2019-11-07 ENCOUNTER — Telehealth: Payer: Self-pay | Admitting: Hematology and Oncology

## 2019-11-07 NOTE — Telephone Encounter (Signed)
I left a message regarding schedule  

## 2019-11-12 ENCOUNTER — Other Ambulatory Visit: Payer: Self-pay | Admitting: Cardiovascular Disease

## 2019-11-12 DIAGNOSIS — I10 Essential (primary) hypertension: Secondary | ICD-10-CM

## 2019-11-13 ENCOUNTER — Encounter: Payer: Self-pay | Admitting: Nurse Practitioner

## 2019-11-13 ENCOUNTER — Other Ambulatory Visit: Payer: Self-pay

## 2019-11-13 ENCOUNTER — Ambulatory Visit (INDEPENDENT_AMBULATORY_CARE_PROVIDER_SITE_OTHER): Payer: Medicare HMO | Admitting: Nurse Practitioner

## 2019-11-13 VITALS — BP 136/78 | HR 82 | Temp 97.5°F | Ht 62.0 in | Wt 203.0 lb

## 2019-11-13 DIAGNOSIS — E7800 Pure hypercholesterolemia, unspecified: Secondary | ICD-10-CM

## 2019-11-13 DIAGNOSIS — G4700 Insomnia, unspecified: Secondary | ICD-10-CM

## 2019-11-13 DIAGNOSIS — G35 Multiple sclerosis: Secondary | ICD-10-CM | POA: Diagnosis not present

## 2019-11-13 DIAGNOSIS — I10 Essential (primary) hypertension: Secondary | ICD-10-CM | POA: Diagnosis not present

## 2019-11-13 DIAGNOSIS — R739 Hyperglycemia, unspecified: Secondary | ICD-10-CM | POA: Diagnosis not present

## 2019-11-13 DIAGNOSIS — Z1212 Encounter for screening for malignant neoplasm of rectum: Secondary | ICD-10-CM

## 2019-11-13 DIAGNOSIS — Z6837 Body mass index (BMI) 37.0-37.9, adult: Secondary | ICD-10-CM | POA: Diagnosis not present

## 2019-11-13 DIAGNOSIS — Z1211 Encounter for screening for malignant neoplasm of colon: Secondary | ICD-10-CM | POA: Diagnosis not present

## 2019-11-13 DIAGNOSIS — I69359 Hemiplegia and hemiparesis following cerebral infarction affecting unspecified side: Secondary | ICD-10-CM | POA: Diagnosis not present

## 2019-11-13 MED ORDER — LABETALOL HCL 300 MG PO TABS: 600.0000 mg | ORAL_TABLET | Freq: Three times a day (TID) | ORAL | 1 refills | Status: DC

## 2019-11-13 MED ORDER — ESZOPICLONE 2 MG PO TABS: 2.0000 mg | ORAL_TABLET | Freq: Every evening | ORAL | 0 refills | Status: DC | PRN

## 2019-11-13 NOTE — Patient Instructions (Addendum)
Call and make a follow up appt with Dr Hilarie Fredrickson, you are OVERDUE for colonoscopy  [facility] Address: [address], [place], [place] [address] Phone: [phone]   Increase physical activity- 30 mins of exercise 5 days a week   DASH Eating Plan DASH stands for "Dietary Approaches to Stop Hypertension." The DASH eating plan is a healthy eating plan that has been shown to reduce high blood pressure (hypertension). It may also reduce your risk for type 2 diabetes, heart disease, and stroke. The DASH eating plan may also help with weight loss. What are tips for following this plan?  General guidelines  Avoid eating more than 2,300 mg (milligrams) of salt (sodium) a day. If you have hypertension, you may need to reduce your sodium intake to 1,500 mg a day.  Limit alcohol intake to no more than 1 drink a day for nonpregnant women and 2 drinks a day for men. One drink equals 12 oz of beer, 5 oz of wine, or 1 oz of hard liquor.  Work with your health care provider to maintain a healthy body weight or to lose weight. Ask what an ideal weight is for you.  Get at least 30 minutes of exercise that causes your heart to beat faster (aerobic exercise) most days of the week. Activities may include walking, swimming, or biking.  Work with your health care provider or diet and nutrition specialist (dietitian) to adjust your eating plan to your individual calorie needs. Reading food labels   Check food labels for the amount of sodium per serving. Choose foods with less than 5 percent of the Daily Value of sodium. Generally, foods with less than 300 mg of sodium per serving fit into this eating plan.  To find whole grains, look for the word "whole" as the first word in the ingredient list. Shopping  Buy products labeled as "low-sodium" or "no salt added."  Buy fresh foods. Avoid canned foods and premade or frozen meals. Cooking  Avoid adding salt when cooking. Use salt-free  seasonings or herbs instead of table salt or sea salt. Check with your health care provider or pharmacist before using salt substitutes.  Do not fry foods. Cook foods using healthy methods such as baking, boiling, grilling, and broiling instead.  Cook with heart-healthy oils, such as olive, canola, soybean, or sunflower oil. Meal planning  Eat a balanced diet that includes: ? 5 or more servings of fruits and vegetables each day. At each meal, try to fill half of your plate with fruits and vegetables. ? Up to 6-8 servings of whole grains each day. ? Less than 6 oz of lean meat, poultry, or fish each day. A 3-oz serving of meat is about the same size as a deck of cards. One egg equals 1 oz. ? 2 servings of low-fat dairy each day. ? A serving of nuts, seeds, or beans 5 times each week. ? Heart-healthy fats. Healthy fats called Omega-3 fatty acids are found in foods such as flaxseeds and coldwater fish, like sardines, salmon, and mackerel.  Limit how much you eat of the following: ? Canned or prepackaged foods. ? Food that is high in trans fat, such as fried foods. ? Food that is high in saturated fat, such as fatty meat. ? Sweets, desserts, sugary drinks, and other foods with added sugar. ? Full-fat dairy products.  Do not salt foods before eating.  Try to eat at least 2 vegetarian meals each week.  Eat more home-cooked food and  less restaurant, buffet, and fast food.  When eating at a restaurant, ask that your food be prepared with less salt or no salt, if possible. What foods are recommended? The items listed may not be a complete list. Talk with your dietitian about what dietary choices are best for you. Grains Whole-grain or whole-wheat bread. Whole-grain or whole-wheat pasta. Brown rice. Modena Morrow. Bulgur. Whole-grain and low-sodium cereals. Pita bread. Low-fat, low-sodium crackers. Whole-wheat flour tortillas. Vegetables Fresh or frozen vegetables (raw, steamed, roasted, or  grilled). Low-sodium or reduced-sodium tomato and vegetable juice. Low-sodium or reduced-sodium tomato sauce and tomato paste. Low-sodium or reduced-sodium canned vegetables. Fruits All fresh, dried, or frozen fruit. Canned fruit in natural juice (without added sugar). Meat and other protein foods Skinless chicken or [place]. Ground chicken or [place]. Pork with fat trimmed off. Fish and seafood. Egg whites. Dried beans, peas, or lentils. Unsalted nuts, nut butters, and seeds. Unsalted canned beans. Lean cuts of beef with fat trimmed off. Low-sodium, lean deli meat. Dairy Low-fat (1%) or fat-free (skim) milk. Fat-free, low-fat, or reduced-fat cheeses. Nonfat, low-sodium ricotta or cottage cheese. Low-fat or nonfat yogurt. Low-fat, low-sodium cheese. Fats and oils Soft margarine without trans fats. Vegetable oil. Low-fat, reduced-fat, or light mayonnaise and salad dressings (reduced-sodium). Canola, safflower, olive, soybean, and sunflower oils. Avocado. Seasoning and other foods Herbs. Spices. Seasoning mixes without salt. Unsalted popcorn and pretzels. Fat-free sweets. What foods are not recommended? The items listed may not be a complete list. Talk with your dietitian about what dietary choices are best for you. Grains Baked goods made with fat, such as croissants, muffins, or some breads. Dry pasta or rice meal packs. Vegetables Creamed or fried vegetables. Vegetables in a cheese sauce. Regular canned vegetables (not low-sodium or reduced-sodium). Regular canned tomato sauce and paste (not low-sodium or reduced-sodium). Regular tomato and vegetable juice (not low-sodium or reduced-sodium). Angie Fava. Olives. Fruits Canned fruit in a light or heavy syrup. Fried fruit. Fruit in cream or butter sauce. Meat and other protein foods Fatty cuts of meat. Ribs. Fried meat. Berniece Salines. Sausage. Bologna and other processed lunch meats. Salami. Fatback. Hotdogs. Bratwurst. Salted nuts and seeds. Canned beans with  added salt. Canned or smoked fish. Whole eggs or egg yolks. Chicken or [place] with skin. Dairy Whole or 2% milk, cream, and half-and-half. Whole or full-fat cream cheese. Whole-fat or sweetened yogurt. Full-fat cheese. Nondairy creamers. Whipped toppings. Processed cheese and cheese spreads. Fats and oils Butter. Stick margarine. Lard. Shortening. Ghee. Bacon fat. Tropical oils, such as coconut, palm kernel, or palm oil. Seasoning and other foods Salted popcorn and pretzels. Onion salt, garlic salt, seasoned salt, table salt, and sea salt. Worcestershire sauce. Tartar sauce. Barbecue sauce. Teriyaki sauce. Soy sauce, including reduced-sodium. Steak sauce. Canned and packaged gravies. Fish sauce. Oyster sauce. Cocktail sauce. Horseradish that you find on the shelf. Ketchup. Mustard. Meat flavorings and tenderizers. Bouillon cubes. Hot sauce and Tabasco sauce. Premade or packaged marinades. Premade or packaged taco seasonings. Relishes. Regular salad dressings. Where to find more information:  National Heart, Lung, and [place]: https://wilson-eaton.com/  American Heart Association: www.heart.org Summary  The DASH eating plan is a healthy eating plan that has been shown to reduce high blood pressure (hypertension). It may also reduce your risk for type 2 diabetes, heart disease, and stroke.  With the DASH eating plan, you should limit salt (sodium) intake to 2,300 mg a day. If you have hypertension, you may need to reduce your sodium intake to 1,500 mg a day.  When on the DASH eating plan, aim to eat more fresh fruits and vegetables, whole grains, lean proteins, low-fat dairy, and heart-healthy fats.  Work with your health care provider or diet and nutrition specialist (dietitian) to adjust your eating plan to your individual calorie needs. This information is not intended to replace advice given to you by your health care provider. Make sure you discuss any questions you have with your health care  provider. Document Released: 10/22/2011 Document Revised: 10/15/2017 Document Reviewed: 10/26/2016 Elsevier Patient Education  2020 [employer].

## 2019-11-13 NOTE — Progress Notes (Signed)
Careteam: Patient Care Team: [name], NP as PCP - General (Geriatric Medicine) Nicholas Lose, MD as Consulting Physician (Oncology) Erroll Luna, MD as Consulting Physician (General Surgery) Thea Silversmith, MD as Consulting Physician (Radiation Oncology)  Advanced Directive information    No Known Allergies  Chief Complaint  Patient presents with  . Medical Management of Chronic Issues    4 month follow-up   . Medication Management    Discuss getting sleeping pill. Patient is having trouble falling asleep.   . Quality Metric Gaps    Dicsuss colonoscopy recommendation      HPI: Patient is a 63 y.o. female seen in the office today for routine follow up.   Insomnia- well controlled but occasionally will have an issue. Has bene on lunesta in the past and works well when needed. 1 mg was not effective but 2 mg works well without making her tired during the day.   htn- saw cardiologist who updated EKG, blood pressure controlled on labetolol and nifedipine.   Hyperlipidemia- continues on lipitor 20 mg daily with diet modification  Obesity- weight is not coming off- would like referral for nutritionist. Not doing exercise.   Unsure why she has not followed up with dr pyrtle GI for colonoscopy, aware she is over due. Plans to do so.     Review of Systems:  Review of Systems  Constitutional: Negative for chills, fever and weight loss.  HENT: Negative for tinnitus.   Respiratory: Negative for cough, sputum production and shortness of breath.   Cardiovascular: Negative for chest pain, palpitations and leg swelling.  Gastrointestinal: Negative for abdominal pain, constipation, diarrhea and heartburn.  Genitourinary: Negative for dysuria, frequency and urgency.  Musculoskeletal: Negative for back pain, falls, joint pain and myalgias.  Skin: Negative.   Neurological: Negative for dizziness and headaches.  Psychiatric/Behavioral: Negative for depression and  memory loss. The patient has insomnia (occasionally, controlled at this time).    Past Medical History:  Diagnosis Date  . Arthritis    lt knee  . Breast cancer ([place])   . Breast cancer of upper-outer quadrant of left female breast ([place]) 04/02/2016  . Colon polyps   . COPD (chronic obstructive pulmonary disease) ([place])   . Hyperlipidemia   . Hypertension   . Multiple sclerosis ([place])   . Personal history of chemotherapy   . Stroke ([place])    2013, mild cognitive deficits  . Vision abnormalities    Past Surgical History:  Procedure Laterality Date  . BREAST BIOPSY Left 03/31/2016  . BREAST LUMPECTOMY Left 04/17/2016  . HYSTEROSCOPY WITH D & C N/A 12/19/2015   Procedure: DILATATION AND CURETTAGE /HYSTEROSCOPY;  Surgeon: Linda Hedges, DO;  Location: [facility];  Service: Gynecology;  Laterality: N/A;  . RADIOACTIVE SEED GUIDED PARTIAL MASTECTOMY WITH AXILLARY SENTINEL LYMPH NODE BIOPSY Left 04/17/2016   Procedure: RADIOACTIVE SEED GUIDED PARTIAL MASTECTOMY WITH AXILLARY SENTINEL LYMPH NODE BIOPSY;  Surgeon: Erroll Luna, MD;  Location: [facility];  Service: General;  Laterality: Left;  RADIOACTIVE SEED GUIDED PARTIAL MASTECTOMY WITH AXILLARY SENTINEL LYMPH NODE BIOPSY   . TUBAL LIGATION    . VAGINAL DELIVERY     x3   Social History:   reports that she quit smoking about 15 years ago. Her smoking use included cigarettes. She has a 12.50 pack-year smoking history. She has never used smokeless tobacco. She reports current alcohol use. She reports that she does not use drugs.  Family History  Problem Relation Age of Onset  .  Stroke Sister   . Hypertension Mother   . Stroke Mother   . Alcohol abuse Brother   . HIV/AIDS Brother     Medications: Patient's Medications  New Prescriptions   No medications on file  Previous Medications   ANASTROZOLE (ARIMIDEX) 1 MG TABLET    Take 1 tablet (1 mg total) by mouth daily.   ASPIRIN EC 81 MG TABLET    Take 1 tablet (81 mg total) by  mouth daily.   ATORVASTATIN (LIPITOR) 20 MG TABLET    Take 1 tablet by mouth once daily   LABETALOL (NORMODYNE) 300 MG TABLET    Take 2 tablets (600 mg total) by mouth 3 (three) times daily.   NIFEDIPINE (ADALAT CC) 60 MG 24 HR TABLET    Take 1 tablet by mouth twice daily  Modified Medications   No medications on file  Discontinued Medications   No medications on file    Physical Exam:  Vitals:   11/13/19 0920  BP: 136/78  Pulse: 82  Temp: (!) 97.5 F (36.4 C)  TempSrc: Temporal  SpO2: 98%  Weight: 203 lb (92.1 kg)  Height: _0  (1.575 m)   Body mass index is 37.13 kg/m. Wt Readings from Last 3 Encounters:  11/13/19 203 lb (92.1 kg)  11/06/19 200 lb 11.2 oz (91 kg)  09/29/19 192 lb (87.1 kg)    Physical Exam Constitutional:      General: She is not in acute distress.    Appearance: She is well-developed. She is not diaphoretic.  HENT:     Head: Normocephalic and atraumatic.     Mouth/Throat:     Pharynx: No oropharyngeal exudate.  Eyes:     Conjunctiva/sclera: Conjunctivae normal.     Pupils: Pupils are equal, round, and reactive to light.  Cardiovascular:     Rate and Rhythm: Normal rate and regular rhythm.     Heart sounds: Normal heart sounds.  Pulmonary:     Effort: Pulmonary effort is normal.     Breath sounds: Normal breath sounds.  Abdominal:     General: Bowel sounds are normal.     Palpations: Abdomen is soft.  Musculoskeletal:        General: No tenderness.     Cervical back: Normal range of motion and neck supple.  Skin:    General: Skin is warm and dry.  Neurological:     Mental Status: She is alert and oriented to person, place, and time.     Labs reviewed: Basic Metabolic Panel: Recent Labs    12/08/18 1351 04/13/19 0912 04/19/19 1033  NA 141 140 141  K 4.3 3.9 4.2  CL 103 105 106  CO2 _1 GLUCOSE 93 124* 119*  BUN _2 CREATININE 1.38* 1.19* 1.20*  CALCIUM 9.8 9.6 9.9   Liver Function Tests: Recent Labs     04/13/19 0912 04/19/19 1033  AST 21 21  ALT 14 18  BILITOT 0.4 0.5  PROT 6.9 6.9   Recent Labs    04/19/19 1033  LIPASE 34  AMYLASE 53   No results for input(s): AMMONIA in the last 8760 hours. CBC: Recent Labs    11/26/18 1320 04/13/19 0912 04/19/19 1033  WBC 8.7 4.4 5.3  NEUTROABS 5.6 2,565 3,726  HGB 12.2 12.6 11.9  HCT 37.3 37.6 36.4  MCV 92.6 91.7 91.7  PLT 280 331 298   Lipid Panel: Recent Labs    04/13/19 0912  CHOL 138  HDL  31  LDLCALC 58  TRIG 74  CHOLHDL 2.1   TSH: No results for input(s): TSH in the last 8760 hours. A1C: Lab Results  Component Value Date   HGBA1C 5.7 (H) 04/13/2019     Assessment/Plan 1. Essential hypertension -controlled with diet and current medication, to continue dash diet with medications. - CMP with eGFR([facility]) - CBC with Differential/Platelet  2. Pure hypercholesterolemia -discussed LDL goal <70, currently at goal on lipitor 20 mg. To continue medication with dietary modifications. - CMP with eGFR([facility]) - CBC with Differential/Platelet  3. Insomnia, unspecified type Overall controlled but occasionally will need medication.  - eszopiclone (LUNESTA) 2 MG TABS tablet; Take 1 tablet (2 mg total) by mouth at bedtime as needed for sleep. Take immediately before bedtime  Dispense: 10 tablet; Refill: 0  4. Hemiplegia following CVA (cerebrovascular accident) ([place]) -continues on ASA daily with proper control of cholesterol and blood pressure  5. Hyperglycemia -reinforced dietary modifications.  - Amb ref to Medical Nutrition Therapy-MNT - Hemoglobin A1c  6. Class 2 severe obesity due to excess calories with serious comorbidity and body mass index (BMI) of 37.0 to 37.9 in adult [facility]) -discussed need for weight loss through proper diet modifications and increase in physical activity, recommending exercise 30 mins 5 days a week. - Amb ref to Medical Nutrition Therapy-MNT  7. Multiple sclerosis ([place]) -stable, followed by  neurology yearly.  8. Encounter for colorectal cancer screening -stressed importance of follow up, number provided and encouraged to go ahead and call to make an appt.  Next appt: 6 month for routine follow up. Carlos American. [place], [facility] [phone]

## 2019-11-14 LAB — CBC WITH DIFFERENTIAL/PLATELET
Absolute Monocytes: 329 cells/uL (ref 200–950)
Basophils Absolute: 32 cells/uL (ref 0–200)
Basophils Relative: 0.7 %
Eosinophils Absolute: 108 cells/uL (ref 15–500)
Eosinophils Relative: 2.4 %
HCT: 37.4 % (ref 35.0–45.0)
Hemoglobin: 12.7 g/dL (ref 11.7–15.5)
Lymphs Abs: 1377 cells/uL (ref 850–3900)
MCH: 31.1 pg (ref 27.0–33.0)
MCHC: 34 g/dL (ref 32.0–36.0)
MCV: 91.4 fL (ref 80.0–100.0)
MPV: 10.6 fL (ref 7.5–12.5)
Monocytes Relative: 7.3 %
Neutro Abs: 2655 cells/uL (ref 1500–7800)
Neutrophils Relative %: 59 %
Platelets: 301 10*3/uL (ref 140–400)
RBC: 4.09 10*6/uL (ref 3.80–5.10)
RDW: 12.8 % (ref 11.0–15.0)
Total Lymphocyte: 30.6 %
WBC: 4.5 10*3/uL (ref 3.8–10.8)

## 2019-11-14 LAB — COMPLETE METABOLIC PANEL WITH GFR
AG Ratio: 2 (calc) (ref 1.0–2.5)
ALT: 21 U/L (ref 6–29)
AST: 23 U/L (ref 10–35)
Albumin: 4.7 g/dL (ref 3.6–5.1)
Alkaline phosphatase (APISO): 98 U/L (ref 37–153)
BUN/Creatinine Ratio: 18 (calc) (ref 6–22)
BUN: 22 mg/dL (ref 7–25)
CO2: 25 mmol/L (ref 20–32)
Calcium: 10 mg/dL (ref 8.6–10.4)
Chloride: 107 mmol/L (ref 98–110)
Creat: 1.22 mg/dL — ABNORMAL HIGH (ref 0.50–0.99)
GFR, Est African American: 54 mL/min/{1.73_m2} — ABNORMAL LOW (ref >=60)
GFR, Est Non African American: 47 mL/min/{1.73_m2} — ABNORMAL LOW (ref >=60)
Globulin: 2.4 g/dL (calc) (ref 1.9–3.7)
Glucose, Bld: 89 mg/dL (ref 65–99)
Potassium: 4 mmol/L (ref 3.5–5.3)
Sodium: 143 mmol/L (ref 135–146)
Total Bilirubin: 0.5 mg/dL (ref 0.2–1.2)
Total Protein: 7.1 g/dL (ref 6.1–8.1)

## 2019-11-14 LAB — HEMOGLOBIN A1C
Hgb A1c MFr Bld: 5.9 % of total Hgb — ABNORMAL HIGH (ref <5.7)
Mean Plasma Glucose: 123 (calc)
eAG (mmol/L): 6.8 (calc)

## 2019-12-07 ENCOUNTER — Ambulatory Visit
Admission: RE | Admit: 2019-12-07 | Discharge: 2019-12-07 | Disposition: A | Discharge status: Home / Self Care | Payer: Medicare HMO | Source: Ambulatory Visit | Attending: Hematology and Oncology | Admitting: Hematology and Oncology

## 2019-12-07 ENCOUNTER — Other Ambulatory Visit: Payer: Self-pay

## 2019-12-07 ENCOUNTER — Ambulatory Visit: Payer: Medicare HMO | Admitting: Neurology

## 2019-12-07 DIAGNOSIS — Z853 Personal history of malignant neoplasm of breast: Secondary | ICD-10-CM

## 2019-12-07 DIAGNOSIS — R928 Other abnormal and inconclusive findings on diagnostic imaging of breast: Secondary | ICD-10-CM | POA: Diagnosis not present

## 2019-12-12 ENCOUNTER — Ambulatory Visit: Payer: Medicare HMO | Admitting: Neurology

## 2019-12-16 ENCOUNTER — Other Ambulatory Visit: Payer: Self-pay | Admitting: Nurse Practitioner

## 2019-12-16 DIAGNOSIS — E7800 Pure hypercholesterolemia, unspecified: Secondary | ICD-10-CM

## 2020-01-31 ENCOUNTER — Other Ambulatory Visit: Payer: Self-pay

## 2020-01-31 DIAGNOSIS — G4700 Insomnia, unspecified: Secondary | ICD-10-CM

## 2020-01-31 MED ORDER — ESZOPICLONE 2 MG PO TABS: 2.0000 mg | ORAL_TABLET | Freq: Every evening | ORAL | 0 refills | Status: DC | PRN

## 2020-01-31 NOTE — Telephone Encounter (Signed)
Patient states she is having difficulty sleeping and is requesting a refill on Lunesta.  Last filled 11/13/19, which was her last office visit.  Next appointment is in June for 6 month followup.   "Insomnia- well controlled but occasionally will have an issue. Has bene on lunesta in the past and works well when needed. 1 mg was not effective but 2 mg works well without making her tired during the day. "

## 2020-02-23 ENCOUNTER — Other Ambulatory Visit: Payer: Self-pay | Admitting: Nurse Practitioner

## 2020-03-22 ENCOUNTER — Other Ambulatory Visit: Payer: Self-pay | Admitting: Nurse Practitioner

## 2020-03-22 DIAGNOSIS — E7800 Pure hypercholesterolemia, unspecified: Secondary | ICD-10-CM

## 2020-03-29 ENCOUNTER — Other Ambulatory Visit: Payer: Self-pay | Admitting: Nurse Practitioner

## 2020-04-11 ENCOUNTER — Other Ambulatory Visit: Payer: Self-pay | Admitting: *Deleted

## 2020-04-11 DIAGNOSIS — G4700 Insomnia, unspecified: Secondary | ICD-10-CM

## 2020-04-11 MED ORDER — ESZOPICLONE 2 MG PO TABS: 2.0000 mg | ORAL_TABLET | Freq: Every evening | ORAL | 0 refills | Status: DC | PRN

## 2020-04-11 NOTE — Telephone Encounter (Signed)
Patient requested refill [place] Verified LR: 01/31/2020 Patient takes as needed Pended Rx and sent to Dr. Mariea Clonts for approval due to [name] out of office.

## 2020-05-03 ENCOUNTER — Other Ambulatory Visit: Payer: Self-pay | Admitting: Nurse Practitioner

## 2020-05-13 ENCOUNTER — Ambulatory Visit (INDEPENDENT_AMBULATORY_CARE_PROVIDER_SITE_OTHER): Payer: Medicare HMO | Admitting: Nurse Practitioner

## 2020-05-13 ENCOUNTER — Encounter: Payer: Self-pay | Admitting: Nurse Practitioner

## 2020-05-13 ENCOUNTER — Other Ambulatory Visit: Payer: Self-pay

## 2020-05-13 VITALS — BP 122/78 | HR 74 | Temp 97.8°F | Ht 62.0 in | Wt 195.0 lb

## 2020-05-13 DIAGNOSIS — R739 Hyperglycemia, unspecified: Secondary | ICD-10-CM

## 2020-05-13 DIAGNOSIS — Z853 Personal history of malignant neoplasm of breast: Secondary | ICD-10-CM | POA: Diagnosis not present

## 2020-05-13 DIAGNOSIS — I69359 Hemiplegia and hemiparesis following cerebral infarction affecting unspecified side: Secondary | ICD-10-CM | POA: Diagnosis not present

## 2020-05-13 DIAGNOSIS — G35 Multiple sclerosis: Secondary | ICD-10-CM

## 2020-05-13 DIAGNOSIS — I10 Essential (primary) hypertension: Secondary | ICD-10-CM | POA: Diagnosis not present

## 2020-05-13 DIAGNOSIS — Z6837 Body mass index (BMI) 37.0-37.9, adult: Secondary | ICD-10-CM

## 2020-05-13 DIAGNOSIS — G4700 Insomnia, unspecified: Secondary | ICD-10-CM

## 2020-05-13 DIAGNOSIS — N1832 Chronic kidney disease, stage 3b: Secondary | ICD-10-CM | POA: Diagnosis not present

## 2020-05-13 DIAGNOSIS — E7800 Pure hypercholesterolemia, unspecified: Secondary | ICD-10-CM | POA: Diagnosis not present

## 2020-05-13 MED ORDER — ESZOPICLONE 2 MG PO TABS: 2.0000 mg | ORAL_TABLET | Freq: Every evening | ORAL | 0 refills | Status: DC | PRN

## 2020-05-13 MED ORDER — AMLODIPINE BESYLATE 10 MG PO TABS: 10.0000 mg | ORAL_TABLET | Freq: Every day | ORAL | 0 refills | Status: DC

## 2020-05-13 NOTE — Patient Instructions (Addendum)
Once you complete nifedpine prescription start NORVASC (amlodipine) 10 mg by mouth ONCE daily for blood pressure Take in the afternoon when blood pressure getting high. Continue to monitor blood pressure.   Goal bp is <140/90 To call if your blood pressure   COGNITIVE BEHAVIORAL THERAPY FOR SLEEP Take take sleep medication as prescribed. -- do NOT take 2 tablets at once.    Insomnia Insomnia is a sleep disorder that makes it difficult to fall asleep or stay asleep. Insomnia can cause fatigue, low energy, difficulty concentrating, mood swings, and poor performance at work or school. There are three different ways to classify insomnia:  Difficulty falling asleep.  Difficulty staying asleep.  Waking up too early in the morning. Any type of insomnia can be long-term (chronic) or short-term (acute). Both are common. Short-term insomnia usually lasts for three months or less. Chronic insomnia occurs at least three times a week for longer than three months. What are the causes? Insomnia may be caused by another condition, situation, or substance, such as:  Anxiety.  Certain medicines.  Gastroesophageal reflux disease (GERD) or other gastrointestinal conditions.  Asthma or other breathing conditions.  Restless legs syndrome, sleep apnea, or other sleep disorders.  Chronic pain.  Menopause.  Stroke.  Abuse of alcohol, tobacco, or illegal drugs.  Mental health conditions, such as depression.  Caffeine.  Neurological disorders, such as Alzheimer's disease.  An overactive thyroid (hyperthyroidism). Sometimes, the cause of insomnia may not be known. What increases the risk? Risk factors for insomnia include:  Gender. Women are affected more often than men.  Age. Insomnia is more common as you get older.  Stress.  Lack of exercise.  Irregular work schedule or working night shifts.  Traveling between different time zones.  Certain medical and mental health  conditions. What are the signs or symptoms? If you have insomnia, the main symptom is having trouble falling asleep or having trouble staying asleep. This may lead to other symptoms, such as:  Feeling fatigued or having low energy.  Feeling nervous about going to sleep.  Not feeling rested in the morning.  Having trouble concentrating.  Feeling irritable, anxious, or depressed. How is this diagnosed? This condition may be diagnosed based on:  Your symptoms and medical history. Your health care provider may ask about: ? Your sleep habits. ? Any medical conditions you have. ? Your mental health.  A physical exam. How is this treated? Treatment for insomnia depends on the cause. Treatment may focus on treating an underlying condition that is causing insomnia. Treatment may also include:  Medicines to help you sleep.  Counseling or therapy.  Lifestyle adjustments to help you sleep better. Follow these instructions at home: Eating and drinking   Limit or avoid alcohol, caffeinated beverages, and cigarettes, especially close to bedtime. These can disrupt your sleep.  Do not eat a large meal or eat spicy foods right before bedtime. This can lead to digestive discomfort that can make it hard for you to sleep. Sleep habits   Keep a sleep diary to help you and your health care provider figure out what could be causing your insomnia. Write down: ? When you sleep. ? When you wake up during the night. ? How well you sleep. ? How rested you feel the next day. ? Any side effects of medicines you are taking. ? What you eat and drink.  Make your bedroom a dark, comfortable place where it is easy to fall asleep. ? Put up shades or blackout  curtains to block light from outside. ? Use a white noise machine to block noise. ? Keep the temperature cool.  Limit screen use before bedtime. This includes: ? Watching TV. ? Using your smartphone, tablet, or computer.  Stick to a routine  that includes going to bed and waking up at the same times every day and night. This can help you fall asleep faster. Consider making a quiet activity, such as reading, part of your nighttime routine.  Try to avoid taking naps during the day so that you sleep better at night.  Get out of bed if you are still awake after 15 minutes of trying to sleep. Keep the lights down, but try reading or doing a quiet activity. When you feel sleepy, go back to bed. General instructions  Take over-the-counter and prescription medicines only as told by your health care provider.  Exercise regularly, as told by your health care provider. Avoid exercise starting several hours before bedtime.  Use relaxation techniques to manage stress. Ask your health care provider to suggest some techniques that may work well for you. These may include: ? Breathing exercises. ? Routines to release muscle tension. ? Visualizing peaceful scenes.  Make sure that you drive carefully. Avoid driving if you feel very sleepy.  Keep all follow-up visits as told by your health care provider. This is important. Contact a health care provider if:  You are tired throughout the day.  You have trouble in your daily routine due to sleepiness.  You continue to have sleep problems, or your sleep problems get worse. Get help right away if:  You have serious thoughts about hurting yourself or someone else. If you ever feel like you may hurt yourself or others, or have thoughts about taking your own life, get help right away. You can go to your nearest emergency department or call:  Your local emergency services (911 in the [place]  A suicide crisis helpline, such as the [facility] at 406 308 4772. This is open 24 hours a day. Summary  Insomnia is a sleep disorder that makes it difficult to fall asleep or stay asleep.  Insomnia can be long-term (chronic) or short-term (acute).  Treatment for insomnia  depends on the cause. Treatment may focus on treating an underlying condition that is causing insomnia.  Keep a sleep diary to help you and your health care provider figure out what could be causing your insomnia. This information is not intended to replace advice given to you by your health care provider. Make sure you discuss any questions you have with your health care provider. Document Revised: 10/15/2017 Document Reviewed: 08/12/2017 Elsevier Patient Education  [place] DASH stands for "Dietary Approaches to Stop Hypertension." The DASH eating plan is a healthy eating plan that has been shown to reduce high blood pressure (hypertension). It may also reduce your risk for type 2 diabetes, heart disease, and stroke. The DASH eating plan may also help with weight loss. What are tips for following this plan?  General guidelines  Avoid eating more than 2,300 mg (milligrams) of salt (sodium) a day. If you have hypertension, you may need to reduce your sodium intake to 1,500 mg a day.  Limit alcohol intake to no more than 1 drink a day for nonpregnant women and 2 drinks a day for men. One drink equals 12 oz of beer, 5 oz of wine, or 1 oz of hard liquor.  Work with your health  care provider to maintain a healthy body weight or to lose weight. Ask what an ideal weight is for you.  Get at least 30 minutes of exercise that causes your heart to beat faster (aerobic exercise) most days of the week. Activities may include walking, swimming, or biking.  Work with your health care provider or diet and nutrition specialist (dietitian) to adjust your eating plan to your individual calorie needs. Reading food labels   Check food labels for the amount of sodium per serving. Choose foods with less than 5 percent of the Daily Value of sodium. Generally, foods with less than 300 mg of sodium per serving fit into this eating plan.  To find whole grains, look for the word  "whole" as the first word in the ingredient list. Shopping  Buy products labeled as "low-sodium" or "no salt added."  Buy fresh foods. Avoid canned foods and premade or frozen meals. Cooking  Avoid adding salt when cooking. Use salt-free seasonings or herbs instead of table salt or sea salt. Check with your health care provider or pharmacist before using salt substitutes.  Do not fry foods. Cook foods using healthy methods such as baking, boiling, grilling, and broiling instead.  Cook with heart-healthy oils, such as olive, canola, soybean, or sunflower oil. Meal planning  Eat a balanced diet that includes: ? 5 or more servings of fruits and vegetables each day. At each meal, try to fill half of your plate with fruits and vegetables. ? Up to 6-8 servings of whole grains each day. ? Less than 6 oz of lean meat, poultry, or fish each day. A 3-oz serving of meat is about the same size as a deck of cards. One egg equals 1 oz. ? 2 servings of low-fat dairy each day. ? A serving of nuts, seeds, or beans 5 times each week. ? Heart-healthy fats. Healthy fats called Omega-3 fatty acids are found in foods such as flaxseeds and coldwater fish, like sardines, salmon, and mackerel.  Limit how much you eat of the following: ? Canned or prepackaged foods. ? Food that is high in trans fat, such as fried foods. ? Food that is high in saturated fat, such as fatty meat. ? Sweets, desserts, sugary drinks, and other foods with added sugar. ? Full-fat dairy products.  Do not salt foods before eating.  Try to eat at least 2 vegetarian meals each week.  Eat more home-cooked food and less restaurant, buffet, and fast food.  When eating at a restaurant, ask that your food be prepared with less salt or no salt, if possible. What foods are recommended? The items listed may not be a complete list. Talk with your dietitian about what dietary choices are best for you. Grains Whole-grain or whole-wheat  bread. Whole-grain or whole-wheat pasta. Brown rice. [place]. Bulgur. Whole-grain and low-sodium cereals. Pita bread. Low-fat, low-sodium crackers. Whole-wheat flour tortillas. Vegetables Fresh or frozen vegetables (raw, steamed, roasted, or grilled). Low-sodium or reduced-sodium tomato and vegetable juice. Low-sodium or reduced-sodium tomato sauce and tomato paste. Low-sodium or reduced-sodium canned vegetables. Fruits All fresh, dried, or frozen fruit. Canned fruit in natural juice (without added sugar). Meat and other protein foods Skinless chicken or [place]. Ground chicken or [place]. Pork with fat trimmed off. Fish and seafood. Egg whites. Dried beans, peas, or lentils. Unsalted nuts, nut butters, and seeds. Unsalted canned beans. Lean cuts of beef with fat trimmed off. Low-sodium, lean deli meat. Dairy Low-fat (1%) or fat-free (skim) milk. Fat-free, low-fat, or  reduced-fat cheeses. Nonfat, low-sodium ricotta or cottage cheese. Low-fat or nonfat yogurt. Low-fat, low-sodium cheese. Fats and oils Soft margarine without trans fats. Vegetable oil. Low-fat, reduced-fat, or light mayonnaise and salad dressings (reduced-sodium). Canola, safflower, olive, soybean, and sunflower oils. Avocado. Seasoning and other foods Herbs. Spices. Seasoning mixes without salt. Unsalted popcorn and pretzels. Fat-free sweets. What foods are not recommended? The items listed may not be a complete list. Talk with your dietitian about what dietary choices are best for you. Grains Baked goods made with fat, such as croissants, muffins, or some breads. Dry pasta or rice meal packs. Vegetables Creamed or fried vegetables. Vegetables in a cheese sauce. Regular canned vegetables (not low-sodium or reduced-sodium). Regular canned tomato sauce and paste (not low-sodium or reduced-sodium). Regular tomato and vegetable juice (not low-sodium or reduced-sodium). Angie Fava. Olives. Fruits Canned fruit in a light or heavy  syrup. Fried fruit. Fruit in cream or butter sauce. Meat and other protein foods Fatty cuts of meat. Ribs. Fried meat. Berniece Salines. Sausage. Bologna and other processed lunch meats. Salami. Fatback. Hotdogs. Bratwurst. Salted nuts and seeds. Canned beans with added salt. Canned or smoked fish. Whole eggs or egg yolks. Chicken or [place] with skin. Dairy Whole or 2% milk, cream, and half-and-half. Whole or full-fat cream cheese. Whole-fat or sweetened yogurt. Full-fat cheese. Nondairy creamers. Whipped toppings. Processed cheese and cheese spreads. Fats and oils Butter. Stick margarine. Lard. Shortening. Ghee. Bacon fat. Tropical oils, such as coconut, palm kernel, or palm oil. Seasoning and other foods Salted popcorn and pretzels. Onion salt, garlic salt, seasoned salt, table salt, and sea salt. Worcestershire sauce. Tartar sauce. Barbecue sauce. Teriyaki sauce. Soy sauce, including reduced-sodium. Steak sauce. Canned and packaged gravies. Fish sauce. Oyster sauce. Cocktail sauce. Horseradish that you find on the shelf. Ketchup. Mustard. Meat flavorings and tenderizers. Bouillon cubes. Hot sauce and Tabasco sauce. Premade or packaged marinades. Premade or packaged taco seasonings. Relishes. Regular salad dressings. Where to find more information:  National Heart, Lung, and [place]: https://wilson-eaton.com/  American Heart Association: www.heart.org Summary  The DASH eating plan is a healthy eating plan that has been shown to reduce high blood pressure (hypertension). It may also reduce your risk for type 2 diabetes, heart disease, and stroke.  With the DASH eating plan, you should limit salt (sodium) intake to 2,300 mg a day. If you have hypertension, you may need to reduce your sodium intake to 1,500 mg a day.  When on the DASH eating plan, aim to eat more fresh fruits and vegetables, whole grains, lean proteins, low-fat dairy, and heart-healthy fats.  Work with your health care provider or diet and  nutrition specialist (dietitian) to adjust your eating plan to your individual calorie needs. This information is not intended to replace advice given to you by your health care provider. Make sure you discuss any questions you have with your health care provider. Document Revised: 10/15/2017 Document Reviewed: 10/26/2016 Elsevier Patient Education  2020 [employer].

## 2020-05-13 NOTE — Progress Notes (Addendum)
Careteam: Patient Care Team: [name], NP as PCP - General (Geriatric Medicine) Nicholas Lose, MD as Consulting Physician (Oncology) Erroll Luna, MD as Consulting Physician (General Surgery) Thea Silversmith, MD as Consulting Physician (Radiation Oncology)  PLACE OF SERVICE:  [place] Directive information Does Patient Have a Medical Advance Directive?: No  No Known Allergies  Chief Complaint  Patient presents with   Medical Management of Chronic Issues    6 month follow up      HPI: Patient is a 64 y.o. female for routine follow up  Insomnia- ongoing, has a hard time settling down and needs something to sleep. Sometimes she takes 2 at a time. Has to be at work at 60. She gets #10 when filled and last refill was 04/11/20  Hyperlipidemia- working with her self to help   Hypertension- making dietary modifications, reports labetalol, reports it works only for a few hours in the afternoon. She feels like this medication needs to be changed. States she is doing what she is supposed to do but blood pressure is high in the afternoon. On nifedipine 60 mg but medication is very expensive. Would like lower cost. She can not afford it.   Hx of breast cancer- continues to follow up with oncology. Continues on the anastrozole- has another year of treatment to complete 5 years  More active at work, more muscle. "working out" with her job. Eating "what I am supposed to eat"  Feeling great.   Review of Systems:  Review of Systems  Constitutional: Negative for chills, fever and weight loss.  HENT: Negative for tinnitus.   Respiratory: Negative for cough, sputum production and shortness of breath.   Cardiovascular: Negative for chest pain, palpitations and leg swelling.  Gastrointestinal: Negative for abdominal pain, constipation, diarrhea and heartburn.  Genitourinary: Negative for dysuria, frequency and urgency.  Musculoskeletal: Negative for back pain, falls,  joint pain and myalgias.  Skin: Negative.   Neurological: Negative for dizziness and headaches.  Psychiatric/Behavioral: Negative for depression and memory loss. The patient has insomnia (at times).     Past Medical History:  Diagnosis Date   Arthritis    lt knee   Breast cancer ([place])    Breast cancer of upper-outer quadrant of left female breast ([place]) 04/02/2016   Colon polyps    COPD (chronic obstructive pulmonary disease) ([place])    Hyperlipidemia    Hypertension    Multiple sclerosis ([place])    Personal history of chemotherapy    Stroke ([place])    2013, mild cognitive deficits   Vision abnormalities    Past Surgical History:  Procedure Laterality Date   BREAST BIOPSY Left 03/31/2016   BREAST LUMPECTOMY Left 04/17/2016   HYSTEROSCOPY WITH D & C N/A 12/19/2015   Procedure: DILATATION AND CURETTAGE /HYSTEROSCOPY;  Surgeon: Linda Hedges, DO;  Location: [facility];  Service: Gynecology;  Laterality: N/A;   RADIOACTIVE SEED GUIDED PARTIAL MASTECTOMY WITH AXILLARY SENTINEL LYMPH NODE BIOPSY Left 04/17/2016   Procedure: RADIOACTIVE SEED GUIDED PARTIAL MASTECTOMY WITH AXILLARY SENTINEL LYMPH NODE BIOPSY;  Surgeon: Erroll Luna, MD;  Location: [facility];  Service: General;  Laterality: Left;  RADIOACTIVE SEED GUIDED PARTIAL MASTECTOMY WITH AXILLARY SENTINEL LYMPH NODE BIOPSY    TUBAL LIGATION     VAGINAL DELIVERY     x3   Social History:   reports that she quit smoking about 15 years ago. Her smoking use included cigarettes. She has a 12.50 pack-year smoking history. She has  never used smokeless tobacco. She reports current alcohol use. She reports that she does not use drugs.  Family History  Problem Relation Age of Onset   Stroke Sister    Hypertension Mother    Stroke Mother    Alcohol abuse Brother    HIV/AIDS Brother     Medications: Patient's Medications  New Prescriptions   No medications on file  Previous Medications   ANASTROZOLE  (ARIMIDEX) 1 MG TABLET    Take 1 tablet (1 mg total) by mouth daily.   ASPIRIN EC 81 MG TABLET    Take 1 tablet (81 mg total) by mouth daily.   ATORVASTATIN (LIPITOR) 20 MG TABLET    Take 1 tablet by mouth once daily   ESZOPICLONE (LUNESTA) 2 MG TABS TABLET    Take 1 tablet (2 mg total) by mouth at bedtime as needed for sleep. Take immediately before bedtime   LABETALOL (NORMODYNE) 300 MG TABLET    Take 2 tablets (600 mg total) by mouth 3 (three) times daily.   NIFEDIPINE (ADALAT CC) 60 MG 24 HR TABLET    Take 1 tablet by mouth twice daily  Modified Medications   No medications on file  Discontinued Medications   No medications on file    Physical Exam:  Vitals:   05/13/20 0851  BP: 122/78  Pulse: 74  Temp: 97.8 F (36.6 C)  TempSrc: Temporal  SpO2: 97%  Weight: 195 lb (88.5 kg)  Height: 5\' 2"  (1.575 m)   Body mass index is 35.67 kg/m. Wt Readings from Last 3 Encounters:  05/13/20 195 lb (88.5 kg)  11/13/19 203 lb (92.1 kg)  11/06/19 200 lb 11.2 oz (91 kg)    Physical Exam Constitutional:      General: She is not in acute distress.    Appearance: She is well-developed. She is not diaphoretic.  HENT:     Head: Normocephalic and atraumatic.     Mouth/Throat:     Pharynx: No oropharyngeal exudate.  Eyes:     Conjunctiva/sclera: Conjunctivae normal.     Pupils: Pupils are equal, round, and reactive to light.  Cardiovascular:     Rate and Rhythm: Normal rate and regular rhythm.     Heart sounds: Normal heart sounds.  Pulmonary:     Effort: Pulmonary effort is normal.     Breath sounds: Normal breath sounds.  Abdominal:     General: Bowel sounds are normal.     Palpations: Abdomen is soft.  Musculoskeletal:        General: No tenderness.     Cervical back: Normal range of motion and neck supple.     Right lower leg: No edema.     Left lower leg: No edema.  Skin:    General: Skin is warm and dry.  Neurological:     Mental Status: She is alert and oriented to  person, place, and time.  Psychiatric:        Mood and Affect: Mood normal.        Behavior: Behavior normal.     Labs reviewed: Basic Metabolic Panel: Recent Labs    11/13/19 0957  NA 143  K 4.0  CL 107  CO2 25  GLUCOSE 89  BUN 22  CREATININE 1.22*  CALCIUM 10.0   Liver Function Tests: Recent Labs    11/13/19 0957  AST 23  ALT 21  BILITOT 0.5  PROT 7.1   No results for input(s): LIPASE, AMYLASE in the last 8760 hours.  No results for input(s): AMMONIA in the last 8760 hours. CBC: Recent Labs    11/13/19 0957  WBC 4.5  NEUTROABS 2,655  HGB 12.7  HCT 37.4  MCV 91.4  PLT 301   Lipid Panel: No results for input(s): CHOL, HDL, LDLCALC, TRIG, CHOLHDL, LDLDIRECT in the last 8760 hours. TSH: No results for input(s): TSH in the last 8760 hours. A1C: Lab Results  Component Value Date   HGBA1C 5.9 (H) 11/13/2019     Assessment/Plan 1. Essential hypertension Controlled during the visit but reports high blood pressure in the afternoon. Taking labetalol TID and nifedipine  But can not afford, will stop nifedipine  And have her start norvasc 10 mg, to monitor bp at home, goal <140/90, and notify if staying elevated. Continue dash diet. - CBC with Differential/Platelet - amLODipine (NORVASC) 10 MG tablet; Take 1 tablet (10 mg total) by mouth daily.  Dispense: 90 tablet; Refill: 0  2. Hemiplegia following CVA (cerebrovascular accident) ([place]) Stable, without worsening symptoms, continues on   3. Class 2 severe obesity due to excess calories with serious comorbidity and body mass index (BMI) of 37.0 to 37.9 in adult [facility]) Need for weight loss through increase in physical activity and dietary modifications.   4. Pure hypercholesterolemia -continue lipitor with dietary modifications.   - Lipid Panel - COMPLETE METABOLIC PANEL WITH GFR  5. Insomnia, unspecified type -discussed importance of using prescription as directed to avoid adverse drug reaction. discusses  cognitive behavior therapy for sleep if needed.  - eszopiclone (LUNESTA) 2 MG TABS tablet; Take 1 tablet (2 mg total) by mouth at bedtime as needed for sleep. Take immediately before bedtime  Dispense: 10 tablet; Refill: 0  6. History of breast cancer Ongoing follow up with oncology, continues on anastrole 1 mg daily  7. Hyperglycemia -discussed lifestyle modifications.  - Hemoglobin A1c  8. Multiple sclerosis ([place]) -followed by neurology, not on any medication at this time. Has been stable.   9. CKD stage 3 Encourage proper hydration and to avoid NSAIDS (Aleve, Advil, Motrin, Ibuprofen)   Next appt: 6 months, sooner if needed  Trenell Moxey K. [place], [facility] [phone]

## 2020-05-14 LAB — COMPLETE METABOLIC PANEL WITH GFR
AG Ratio: 1.9 (calc) (ref 1.0–2.5)
ALT: 20 U/L (ref 6–29)
AST: 19 U/L (ref 10–35)
Albumin: 4.6 g/dL (ref 3.6–5.1)
Alkaline phosphatase (APISO): 92 U/L (ref 37–153)
BUN/Creatinine Ratio: 16 (calc) (ref 6–22)
BUN: 23 mg/dL (ref 7–25)
CO2: 27 mmol/L (ref 20–32)
Calcium: 10.1 mg/dL (ref 8.6–10.4)
Chloride: 109 mmol/L (ref 98–110)
Creat: 1.43 mg/dL — ABNORMAL HIGH (ref 0.50–0.99)
GFR, Est African American: 45 mL/min/{1.73_m2} — ABNORMAL LOW (ref >=60)
GFR, Est Non African American: 39 mL/min/{1.73_m2} — ABNORMAL LOW (ref >=60)
Globulin: 2.4 g/dL (calc) (ref 1.9–3.7)
Glucose, Bld: 101 mg/dL — ABNORMAL HIGH (ref 65–99)
Potassium: 4.3 mmol/L (ref 3.5–5.3)
Sodium: 146 mmol/L (ref 135–146)
Total Bilirubin: 0.5 mg/dL (ref 0.2–1.2)
Total Protein: 7 g/dL (ref 6.1–8.1)

## 2020-05-14 LAB — CBC WITH DIFFERENTIAL/PLATELET
Absolute Monocytes: 400 cells/uL (ref 200–950)
Basophils Absolute: 49 cells/uL (ref 0–200)
Basophils Relative: 0.9 %
Eosinophils Absolute: 103 cells/uL (ref 15–500)
Eosinophils Relative: 1.9 %
HCT: 35.3 % (ref 35.0–45.0)
Hemoglobin: 12 g/dL (ref 11.7–15.5)
Lymphs Abs: 1463 cells/uL (ref 850–3900)
MCH: 31.3 pg (ref 27.0–33.0)
MCHC: 34 g/dL (ref 32.0–36.0)
MCV: 92.2 fL (ref 80.0–100.0)
MPV: 10.3 fL (ref 7.5–12.5)
Monocytes Relative: 7.4 %
Neutro Abs: 3386 cells/uL (ref 1500–7800)
Neutrophils Relative %: 62.7 %
Platelets: 272 10*3/uL (ref 140–400)
RBC: 3.83 10*6/uL (ref 3.80–5.10)
RDW: 12.8 % (ref 11.0–15.0)
Total Lymphocyte: 27.1 %
WBC: 5.4 10*3/uL (ref 3.8–10.8)

## 2020-05-14 LAB — LIPID PANEL
Cholesterol: 137 mg/dL (ref <200)
HDL: 70 mg/dL (ref >=50)
LDL Cholesterol (Calc): 55 mg/dL (calc)
Non-HDL Cholesterol (Calc): 67 mg/dL (calc) (ref <130)
Total CHOL/HDL Ratio: 2 (calc) (ref <5.0)
Triglycerides: 50 mg/dL (ref <150)

## 2020-05-14 LAB — HEMOGLOBIN A1C
Hgb A1c MFr Bld: 5.8 % of total Hgb — ABNORMAL HIGH (ref <5.7)
Mean Plasma Glucose: 120 (calc)
eAG (mmol/L): 6.6 (calc)

## 2020-06-01 ENCOUNTER — Other Ambulatory Visit: Payer: Self-pay | Admitting: Nurse Practitioner

## 2020-06-01 DIAGNOSIS — I10 Essential (primary) hypertension: Secondary | ICD-10-CM

## 2020-06-05 ENCOUNTER — Telehealth: Payer: Self-pay | Admitting: *Deleted

## 2020-06-05 ENCOUNTER — Encounter: Payer: Self-pay | Admitting: Nurse Practitioner

## 2020-06-05 ENCOUNTER — Telehealth: Payer: Self-pay

## 2020-06-05 ENCOUNTER — Other Ambulatory Visit: Payer: Self-pay

## 2020-06-05 ENCOUNTER — Telehealth (INDEPENDENT_AMBULATORY_CARE_PROVIDER_SITE_OTHER): Payer: Medicare HMO | Admitting: Nurse Practitioner

## 2020-06-05 VITALS — BP 190/120

## 2020-06-05 DIAGNOSIS — I10 Essential (primary) hypertension: Secondary | ICD-10-CM | POA: Diagnosis not present

## 2020-06-05 MED ORDER — HYDRALAZINE HCL 25 MG PO TABS: 25.0000 mg | ORAL_TABLET | Freq: Three times a day (TID) | ORAL | 0 refills | Status: DC

## 2020-06-05 NOTE — Telephone Encounter (Signed)
Televisit scheduled for today with [name]

## 2020-06-05 NOTE — Telephone Encounter (Signed)
[name], cea are scheduled for a virtual visit with your provider today.    Just as we do with appointments in the office, we must obtain your consent to participate.  Your consent will be active for this visit and any virtual visit you may have with one of our providers in the next 365 days.    If you have a MyChart account, I can also send a copy of this consent to you electronically.  All virtual visits are billed to your insurance company just like a traditional visit in the office.  As this is a virtual visit, video technology does not allow for your provider to perform a traditional examination.  This may limit your provider's ability to fully assess your condition.  If your provider identifies any concerns that need to be evaluated in person or the need to arrange testing such as labs, EKG, etc, we will make arrangements to do so.    Although advances in technology are sophisticated, we cannot ensure that it will always work on either your end or our end.  If the connection with a video visit is poor, we may have to switch to a telephone visit.  With either a video or telephone visit, we are not always able to ensure that we have a secure connection.   I need to obtain your verbal consent now.   Are you willing to proceed with your visit today?   Michele Page has provided verbal consent on 06/05/2020 for a virtual visit (video or telephone).   [name] [place], [place] 06/05/2020  2:42 PM

## 2020-06-05 NOTE — Telephone Encounter (Signed)
Does she want to go back on nifedipine? What was her blood pressure when she was on the amlodipine 10 mg ?

## 2020-06-05 NOTE — Telephone Encounter (Signed)
We can discuss other blood pressure medication if she is interested, or needs anything else; I did not realize she was against taking the amlodipine.

## 2020-06-05 NOTE — Telephone Encounter (Signed)
Patient called and stated that she is upset about her blood pressure medication. Stated that [name] told her to just try Amlodipine 10mg  when she couldn't afford the Nifedipine ($45/Month).  Patient stated that she tried to tell [name] that was not going to work and it hasn't she stated. So she has stopped taking it.  Patient has 2 BP readings: 06/04/20- 160/100 06/05/20- 195/110  Patient stated that ONLY Medications she is taking are: Labetalol 300mg  2 tablets Three times daily (Stated she is taking more than prescribed, stated sometimes she takes as much as 5 times daily)  Anastrozole 1mg  daily  ASA daily  Atorvastatin 20mg  daily.   Patient is requesting a medication similar to the Nifedipine.  Please Advise.

## 2020-06-05 NOTE — Telephone Encounter (Signed)
Per [name]: Start the Amlodipine back and record blood pressures. If blood pressures are no better, Needs an appointment to re evaluate.     Patient stated that she IS NOT going to take the Amlodipine. Stated that there is no sense in taking something she knows is not going to work.   Patient stated that she is going to find her another Provider and thanks anyway.

## 2020-06-05 NOTE — Progress Notes (Signed)
This service is provided via telemedicine  No vital signs collected/recorded due to the encounter was a telemedicine visit.   Location of patient (ex: home, work):  Home  Patient consents to a telephone visit:  Yes, see telephone encounter dated 06/05/2020 with annual consent   Location of the provider (ex: office, home):  [facility], Office   Name of any referring provider:  N/A  Names of all persons participating in the telemedicine service and their role in the encounter:  S.Chrae B/CMA, [name], NP, and Patient   Time spent on call:  5 min with medical assistant      Careteam: Patient Care Team: [name], NP as PCP - General (Geriatric Medicine) Nicholas Lose, MD as Consulting Physician (Oncology) Erroll Luna, MD as Consulting Physician (General Surgery) Thea Silversmith, MD as Consulting Physician (Radiation Oncology)  PLACE OF SERVICE:  [place] Directive information    No Known Allergies  Chief Complaint  Patient presents with   Acute Visit    Blood pressure concerns. Televisit    Medication Management    Patient is taking Labetolol 2 by mouth 5 times daily (different than prescribed)   Medication Management    Patient is not taking Norvasc, tried x 3 days and it did nothing per patient      HPI: Patient is a 64 y.o. female  Blood pressure 190/110 with labetalol and amlodipine.  Reports blood pressure was 190/110 with labetalol only.  Reports I have "had high blood pressure for a long time and I need a strong pill" "10 mg is not going to be enough" Reports she feels fine but it bothers me that my blood pressure is high Denies chest pains, shortness of breath, blurred vision  Review of Systems:  Review of Systems  Constitutional: Negative for chills and fever.  Eyes: Negative for blurred vision.  Respiratory: Negative for shortness of breath.   Cardiovascular: Negative for chest pain,  palpitations and leg swelling.  Neurological: Negative for dizziness, sensory change and headaches.    Past Medical History:  Diagnosis Date   Arthritis    lt knee   Breast cancer ([place])    Breast cancer of upper-outer quadrant of left female breast ([place]) 04/02/2016   Colon polyps    COPD (chronic obstructive pulmonary disease) ([place])    Hyperlipidemia    Hypertension    Multiple sclerosis ([place])    Personal history of chemotherapy    Stroke ([place])    2013, mild cognitive deficits   Vision abnormalities    Past Surgical History:  Procedure Laterality Date   BREAST BIOPSY Left 03/31/2016   BREAST LUMPECTOMY Left 04/17/2016   HYSTEROSCOPY WITH D & C N/A 12/19/2015   Procedure: DILATATION AND CURETTAGE /HYSTEROSCOPY;  Surgeon: Linda Hedges, DO;  Location: [facility];  Service: Gynecology;  Laterality: N/A;   RADIOACTIVE SEED GUIDED PARTIAL MASTECTOMY WITH AXILLARY SENTINEL LYMPH NODE BIOPSY Left 04/17/2016   Procedure: RADIOACTIVE SEED GUIDED PARTIAL MASTECTOMY WITH AXILLARY SENTINEL LYMPH NODE BIOPSY;  Surgeon: Erroll Luna, MD;  Location: [facility];  Service: General;  Laterality: Left;  RADIOACTIVE SEED GUIDED PARTIAL MASTECTOMY WITH AXILLARY SENTINEL LYMPH NODE BIOPSY    TUBAL LIGATION     VAGINAL DELIVERY     x3   Social History:   reports that she quit smoking about 15 years ago. Her smoking use included cigarettes. She has a 12.50 pack-year smoking history. She has never used smokeless  tobacco. She reports current alcohol use. She reports that she does not use drugs.  Family History  Problem Relation Age of Onset   Stroke Sister    Hypertension Mother    Stroke Mother    Alcohol abuse Brother    HIV/AIDS Brother     Medications: Patient's Medications  New Prescriptions   No medications on file  Previous Medications   AMLODIPINE (NORVASC) 10 MG TABLET    Take 1 tablet (10 mg total) by mouth daily.   ANASTROZOLE (ARIMIDEX) 1 MG TABLET     Take 1 tablet (1 mg total) by mouth daily.   ASPIRIN EC 81 MG TABLET    Take 1 tablet (81 mg total) by mouth daily.   ATORVASTATIN (LIPITOR) 20 MG TABLET    Take 1 tablet by mouth once daily   ESZOPICLONE (LUNESTA) 2 MG TABS TABLET    Take 1 tablet (2 mg total) by mouth at bedtime as needed for sleep. Take immediately before bedtime   LABETALOL (NORMODYNE) 300 MG TABLET    TAKE 2 TABLETS BY MOUTH THREE TIMES DAILY  Modified Medications   No medications on file  Discontinued Medications   No medications on file    Physical Exam:  Vitals:   06/05/20 1450  BP: (!) 190/120   There is no height or weight on file to calculate BMI. Wt Readings from Last 3 Encounters:  05/13/20 195 lb (88.5 kg)  11/13/19 203 lb (92.1 kg)  11/06/19 200 lb 11.2 oz (91 kg)   Labs reviewed: Basic Metabolic Panel: Recent Labs    11/13/19 0957 05/13/20 0930  NA 143 146  K 4.0 4.3  CL 107 109  CO2 25 27  GLUCOSE 89 101*  BUN 22 23  CREATININE 1.22* 1.43*  CALCIUM 10.0 10.1   Liver Function Tests: Recent Labs    11/13/19 0957 05/13/20 0930  AST 23 19  ALT 21 20  BILITOT 0.5 0.5  PROT 7.1 7.0   No results for input(s): LIPASE, AMYLASE in the last 8760 hours. No results for input(s): AMMONIA in the last 8760 hours. CBC: Recent Labs    11/13/19 0957 05/13/20 0930  WBC 4.5 5.4  NEUTROABS 2,655 3,386  HGB 12.7 12.0  HCT 37.4 35.3  MCV 91.4 92.2  PLT 301 272   Lipid Panel: Recent Labs    05/13/20 0930  CHOL 137  HDL 70  LDLCALC 55  TRIG 50  CHOLHDL 2.0   TSH: No results for input(s): TSH in the last 8760 hours. A1C: Lab Results  Component Value Date   HGBA1C 5.8 (H) 05/13/2020     Assessment/Plan 1. Essential hypertension Pt very agitated because she felt like a 10 mg tablet was not enough. Educated her dosing of medication and how different medications has different mg but still can be very effective. She is agreeable to start hydralazine 25 mg TID and to continue  labetolol 300 mg 2 tablets TID as prescribed, DASH diet encouraged -she will follow up in office in 1 week, to call sooner if needed -strict precautions on when to go to the ED -also educated her to bring BP cuff in, as home BP cuff may be off. - hydrALAZINE (APRESOLINE) 25 MG tablet; Take 1 tablet (25 mg total) by mouth 3 (three) times daily.  Dispense: 90 tablet; Refill: 0  Next appt: 1 week.  Carlos American. [place], [facility] [phone]    Virtual Visit via Telephone Note  I connected with@ on 06/05/20 at  2:45 PM EDT by telephone and verified that I am speaking with the correct person using two identifiers.  Location: Patient: home Provider: office   I discussed the limitations, risks, security and privacy concerns of performing an evaluation and management service by telephone and the availability of in person appointments. I also discussed with the patient that there may be a patient responsible charge related to this service. The patient expressed understanding and agreed to proceed.   I discussed the assessment and treatment plan with the patient. The patient was provided an opportunity to ask questions and all were answered. The patient agreed with the plan and demonstrated an understanding of the instructions.   The patient was advised to call back or seek an in-person evaluation if the symptoms worsen or if the condition fails to improve as anticipated.  I provided 15 minutes of non-face-to-face time during this encounter.  Carlos American. [facility] printed and mailed

## 2020-06-14 ENCOUNTER — Encounter: Payer: Self-pay | Admitting: Nurse Practitioner

## 2020-06-14 ENCOUNTER — Ambulatory Visit (INDEPENDENT_AMBULATORY_CARE_PROVIDER_SITE_OTHER): Payer: Medicare HMO | Admitting: Nurse Practitioner

## 2020-06-14 ENCOUNTER — Other Ambulatory Visit: Payer: Self-pay

## 2020-06-14 VITALS — BP 132/74 | HR 68 | Temp 96.8°F | Ht 62.0 in | Wt 200.4 lb

## 2020-06-14 DIAGNOSIS — I10 Essential (primary) hypertension: Secondary | ICD-10-CM | POA: Diagnosis not present

## 2020-06-14 DIAGNOSIS — Z1212 Encounter for screening for malignant neoplasm of rectum: Secondary | ICD-10-CM | POA: Diagnosis not present

## 2020-06-14 DIAGNOSIS — Z114 Encounter for screening for human immunodeficiency virus [HIV]: Secondary | ICD-10-CM | POA: Diagnosis not present

## 2020-06-14 DIAGNOSIS — Z1211 Encounter for screening for malignant neoplasm of colon: Secondary | ICD-10-CM | POA: Diagnosis not present

## 2020-06-14 DIAGNOSIS — Z1159 Encounter for screening for other viral diseases: Secondary | ICD-10-CM | POA: Diagnosis not present

## 2020-06-14 MED ORDER — NIFEDIPINE ER OSMOTIC RELEASE 60 MG PO TB24: 60.0000 mg | ORAL_TABLET | Freq: Two times a day (BID) | ORAL | 1 refills | Status: DC

## 2020-06-14 MED ORDER — NIFEDIPINE ER OSMOTIC RELEASE 60 MG PO TB24: 60.0000 mg | ORAL_TABLET | Freq: Every day | ORAL | 1 refills | Status: DC

## 2020-06-14 NOTE — Progress Notes (Signed)
Careteam: Patient Care Team: [name], NP as PCP - General (Geriatric Medicine) Nicholas Lose, MD as Consulting Physician (Oncology) Erroll Luna, MD as Consulting Physician (General Surgery) Thea Silversmith, MD as Consulting Physician (Radiation Oncology)  PLACE OF SERVICE:  [place] Directive information    No Known Allergies  Chief Complaint  Patient presents with  . Acute Visit    Review and discuss medication: Hydralazine      HPI: Patient is a 64 y.o. female for review of blood pressure.  "I need a medication that has high as the labetalol" others do  not work for me. Reports labetalol only last 3 hours and blood pressure high. Feels like she needs to be back on nifedipine but is expensive. Reports when she was on nifedipine blood pressure was fine in the evening now high. No symptoms. Now sbp 180-190/70 No chest pains or headaches.  No blurred vision.  Would like to go back on nifedipine at this time.   Overdue for colonoscopy.   Review of Systems:  Review of Systems  Constitutional: Negative for chills, fever and weight loss.  HENT: Negative for tinnitus.   Respiratory: Negative for cough, sputum production and shortness of breath.   Cardiovascular: Negative for chest pain, palpitations and leg swelling.  Gastrointestinal: Negative for abdominal pain, constipation, diarrhea and heartburn.  Genitourinary: Negative for dysuria, frequency and urgency.  Musculoskeletal: Negative for back pain, falls, joint pain and myalgias.  Skin: Negative.   Neurological: Negative for dizziness and headaches.    Past Medical History:  Diagnosis Date  . Arthritis    lt knee  . Breast cancer ([place])   . Breast cancer of upper-outer quadrant of left female breast ([place]) 04/02/2016  . Colon polyps   . COPD (chronic obstructive pulmonary disease) ([place])   . Hyperlipidemia   . Hypertension   . Multiple sclerosis ([place])   . Personal history of  chemotherapy   . Stroke ([place])    2013, mild cognitive deficits  . Vision abnormalities    Past Surgical History:  Procedure Laterality Date  . BREAST BIOPSY Left 03/31/2016  . BREAST LUMPECTOMY Left 04/17/2016  . HYSTEROSCOPY WITH D & C N/A 12/19/2015   Procedure: DILATATION AND CURETTAGE /HYSTEROSCOPY;  Surgeon: Linda Hedges, DO;  Location: [facility];  Service: Gynecology;  Laterality: N/A;  . RADIOACTIVE SEED GUIDED PARTIAL MASTECTOMY WITH AXILLARY SENTINEL LYMPH NODE BIOPSY Left 04/17/2016   Procedure: RADIOACTIVE SEED GUIDED PARTIAL MASTECTOMY WITH AXILLARY SENTINEL LYMPH NODE BIOPSY;  Surgeon: Erroll Luna, MD;  Location: [facility];  Service: General;  Laterality: Left;  RADIOACTIVE SEED GUIDED PARTIAL MASTECTOMY WITH AXILLARY SENTINEL LYMPH NODE BIOPSY   . TUBAL LIGATION    . VAGINAL DELIVERY     x3   Social History:   reports that she quit smoking about 15 years ago. Her smoking use included cigarettes. She has a 12.50 pack-year smoking history. She has never used smokeless tobacco. She reports current alcohol use. She reports that she does not use drugs.  Family History  Problem Relation Age of Onset  . Stroke Sister   . Hypertension Mother   . Stroke Mother   . Alcohol abuse Brother   . HIV/AIDS Brother     Medications: Patient's Medications  New Prescriptions   No medications on file  Previous Medications   ANASTROZOLE (ARIMIDEX) 1 MG TABLET    Take 1 tablet (1 mg total) by mouth daily.   ASPIRIN EC 81  MG TABLET    Take 1 tablet (81 mg total) by mouth daily.   ATORVASTATIN (LIPITOR) 20 MG TABLET    Take 1 tablet by mouth once daily   ESZOPICLONE (LUNESTA) 2 MG TABS TABLET    Take 1 tablet (2 mg total) by mouth at bedtime as needed for sleep. Take immediately before bedtime   HYDRALAZINE (APRESOLINE) 25 MG TABLET    Take 1 tablet (25 mg total) by mouth 3 (three) times daily.   LABETALOL (NORMODYNE) 300 MG TABLET    TAKE 2 TABLETS BY MOUTH THREE TIMES DAILY    Modified Medications   No medications on file  Discontinued Medications   AMLODIPINE (NORVASC) 10 MG TABLET    Take 1 tablet (10 mg total) by mouth daily.    Physical Exam:  Vitals:   06/14/20 0920  BP: (!) 132/74  Pulse: 68  Temp: (!) 96.8 F (36 C)  TempSrc: Temporal  SpO2: 98%  Weight: 200 lb 6.4 oz (90.9 kg)  Height: 5\' 2"  (1.575 m)   Body mass index is 36.65 kg/m. Wt Readings from Last 3 Encounters:  06/14/20 200 lb 6.4 oz (90.9 kg)  05/13/20 195 lb (88.5 kg)  11/13/19 203 lb (92.1 kg)    Physical Exam Constitutional:      General: She is not in acute distress.    Appearance: She is well-developed. She is not diaphoretic.  HENT:     Head: Normocephalic and atraumatic.  Eyes:     Conjunctiva/sclera: Conjunctivae normal.     Pupils: Pupils are equal, round, and reactive to light.  Cardiovascular:     Rate and Rhythm: Normal rate and regular rhythm.     Heart sounds: Normal heart sounds.  Pulmonary:     Effort: Pulmonary effort is normal.     Breath sounds: Normal breath sounds.  Abdominal:     General: Bowel sounds are normal.     Palpations: Abdomen is soft.  Musculoskeletal:        General: No tenderness.     Cervical back: Normal range of motion and neck supple.  Skin:    General: Skin is warm and dry.  Neurological:     Mental Status: She is alert and oriented to person, place, and time.     Labs reviewed: Basic Metabolic Panel: Recent Labs    11/13/19 0957 05/13/20 0930  NA 143 146  K 4.0 4.3  CL 107 109  CO2 25 27  GLUCOSE 89 101*  BUN 22 23  CREATININE 1.22* 1.43*  CALCIUM 10.0 10.1   Liver Function Tests: Recent Labs    11/13/19 0957 05/13/20 0930  AST 23 19  ALT 21 20  BILITOT 0.5 0.5  PROT 7.1 7.0   No results for input(s): LIPASE, AMYLASE in the last 8760 hours. No results for input(s): AMMONIA in the last 8760 hours. CBC: Recent Labs    11/13/19 0957 05/13/20 0930  WBC 4.5 5.4  NEUTROABS 2,655 3,386  HGB 12.7  12.0  HCT 37.4 35.3  MCV 91.4 92.2  PLT 301 272   Lipid Panel: Recent Labs    05/13/20 0930  CHOL 137  HDL 70  LDLCALC 55  TRIG 50  CHOLHDL 2.0   TSH: No results for input(s): TSH in the last 8760 hours. A1C: Lab Results  Component Value Date   HGBA1C 5.8 (H) 05/13/2020     Assessment/Plan 1. Essential hypertension Controlled in office today but she states it is high in the evening.  Stop Hydralazine at this time and restart nidedipine 60 mg BID.  - NIFEdipine (PROCARDIA XL/NIFEDICAL XL) 60 MG 24 hr tablet; Take 1 tablet (60 mg total) by mouth 2 (two) times daily.  Dispense: 60 tablet; Refill: 1  2. Need for hepatitis C screening test - Hepatitis C antibody  3. Encounter for screening for HIV - HIV Antibody (routine testing w rflx)  4. Colorectal screening She plans to call to schedule colonoscopy- number provided   Next appt: 3 months for orutine follow up.  Carlos American. [place], [facility] [phone]

## 2020-06-14 NOTE — Patient Instructions (Addendum)
Dr. Lorretta Harp, MD  [phone]  To bring blood pressure cuff and blood pressure log to appt.    To call Gastroenterologist for follow up colonoscopy Annitta Needs, MD Address: [address], [place], [place] [address] Main Line: [phone]  TO GET TDAP at your local pharmacy.       DASH Eating Plan DASH stands for "Dietary Approaches to Stop Hypertension." The DASH eating plan is a healthy eating plan that has been shown to reduce high blood pressure (hypertension). It may also reduce your risk for type 2 diabetes, heart disease, and stroke. The DASH eating plan may also help with weight loss. What are tips for following this plan?  General guidelines  Avoid eating more than 2,300 mg (milligrams) of salt (sodium) a day. If you have hypertension, you may need to reduce your sodium intake to 1,500 mg a day.  Limit alcohol intake to no more than 1 drink a day for nonpregnant women and 2 drinks a day for men. One drink equals 12 oz of beer, 5 oz of wine, or 1 oz of hard liquor.  Work with your health care provider to maintain a healthy body weight or to lose weight. Ask what an ideal weight is for you.  Get at least 30 minutes of exercise that causes your heart to beat faster (aerobic exercise) most days of the week. Activities may include walking, swimming, or biking.  Work with your health care provider or diet and nutrition specialist (dietitian) to adjust your eating plan to your individual calorie needs. Reading food labels   Check food labels for the amount of sodium per serving. Choose foods with less than 5 percent of the Daily Value of sodium. Generally, foods with less than 300 mg of sodium per serving fit into this eating plan.  To find whole grains, look for the word "whole" as the first word in the ingredient list. Shopping  Buy products labeled as "low-sodium" or "no salt added."  Buy fresh foods. Avoid canned foods and premade or frozen  meals. Cooking  Avoid adding salt when cooking. Use salt-free seasonings or herbs instead of table salt or sea salt. Check with your health care provider or pharmacist before using salt substitutes.  Do not fry foods. Cook foods using healthy methods such as baking, boiling, grilling, and broiling instead.  Cook with heart-healthy oils, such as olive, canola, soybean, or sunflower oil. Meal planning  Eat a balanced diet that includes: ? 5 or more servings of fruits and vegetables each day. At each meal, try to fill half of your plate with fruits and vegetables. ? Up to 6-8 servings of whole grains each day. ? Less than 6 oz of lean meat, poultry, or fish each day. A 3-oz serving of meat is about the same size as a deck of cards. One egg equals 1 oz. ? 2 servings of low-fat dairy each day. ? A serving of nuts, seeds, or beans 5 times each week. ? Heart-healthy fats. Healthy fats called Omega-3 fatty acids are found in foods such as flaxseeds and coldwater fish, like sardines, salmon, and mackerel.  Limit how much you eat of the following: ? Canned or prepackaged foods. ? Food that is high in trans fat, such as fried foods. ? Food that is high in saturated fat, such as fatty meat. ? Sweets, desserts, sugary drinks, and other foods with added sugar. ? Full-fat dairy products.  Do not salt foods before eating.  Try to eat at least 2 vegetarian meals each week.  Eat more home-cooked food and less restaurant, buffet, and fast food.  When eating at a restaurant, ask that your food be prepared with less salt or no salt, if possible. What foods are recommended? The items listed may not be a complete list. Talk with your dietitian about what dietary choices are best for you. Grains Whole-grain or whole-wheat bread. Whole-grain or whole-wheat pasta. Brown rice. Modena Morrow. Bulgur. Whole-grain and low-sodium cereals. Pita bread. Low-fat, low-sodium crackers. Whole-wheat flour  tortillas. Vegetables Fresh or frozen vegetables (raw, steamed, roasted, or grilled). Low-sodium or reduced-sodium tomato and vegetable juice. Low-sodium or reduced-sodium tomato sauce and tomato paste. Low-sodium or reduced-sodium canned vegetables. Fruits All fresh, dried, or frozen fruit. Canned fruit in natural juice (without added sugar). Meat and other protein foods Skinless chicken or [place]. Ground chicken or [place]. Pork with fat trimmed off. Fish and seafood. Egg whites. Dried beans, peas, or lentils. Unsalted nuts, nut butters, and seeds. Unsalted canned beans. Lean cuts of beef with fat trimmed off. Low-sodium, lean deli meat. Dairy Low-fat (1%) or fat-free (skim) milk. Fat-free, low-fat, or reduced-fat cheeses. Nonfat, low-sodium ricotta or cottage cheese. Low-fat or nonfat yogurt. Low-fat, low-sodium cheese. Fats and oils Soft margarine without trans fats. Vegetable oil. Low-fat, reduced-fat, or light mayonnaise and salad dressings (reduced-sodium). Canola, safflower, olive, soybean, and sunflower oils. Avocado. Seasoning and other foods Herbs. Spices. Seasoning mixes without salt. Unsalted popcorn and pretzels. Fat-free sweets. What foods are not recommended? The items listed may not be a complete list. Talk with your dietitian about what dietary choices are best for you. Grains Baked goods made with fat, such as croissants, muffins, or some breads. Dry pasta or rice meal packs. Vegetables Creamed or fried vegetables. Vegetables in a cheese sauce. Regular canned vegetables (not low-sodium or reduced-sodium). Regular canned tomato sauce and paste (not low-sodium or reduced-sodium). Regular tomato and vegetable juice (not low-sodium or reduced-sodium). Angie Fava. Olives. Fruits Canned fruit in a light or heavy syrup. Fried fruit. Fruit in cream or butter sauce. Meat and other protein foods Fatty cuts of meat. Ribs. Fried meat. Berniece Salines. Sausage. Bologna and other processed lunch meats.  Salami. Fatback. Hotdogs. Bratwurst. Salted nuts and seeds. Canned beans with added salt. Canned or smoked fish. Whole eggs or egg yolks. Chicken or [place] with skin. Dairy Whole or 2% milk, cream, and half-and-half. Whole or full-fat cream cheese. Whole-fat or sweetened yogurt. Full-fat cheese. Nondairy creamers. Whipped toppings. Processed cheese and cheese spreads. Fats and oils Butter. Stick margarine. Lard. Shortening. Ghee. Bacon fat. Tropical oils, such as coconut, palm kernel, or palm oil. Seasoning and other foods Salted popcorn and pretzels. Onion salt, garlic salt, seasoned salt, table salt, and sea salt. Worcestershire sauce. Tartar sauce. Barbecue sauce. Teriyaki sauce. Soy sauce, including reduced-sodium. Steak sauce. Canned and packaged gravies. Fish sauce. Oyster sauce. Cocktail sauce. Horseradish that you find on the shelf. Ketchup. Mustard. Meat flavorings and tenderizers. Bouillon cubes. Hot sauce and Tabasco sauce. Premade or packaged marinades. Premade or packaged taco seasonings. Relishes. Regular salad dressings. Where to find more information:  National Heart, Lung, and [place]: https://wilson-eaton.com/  American Heart Association: www.heart.org Summary  The DASH eating plan is a healthy eating plan that has been shown to reduce high blood pressure (hypertension). It may also reduce your risk for type 2 diabetes, heart disease, and stroke.  With the DASH eating plan, you should limit salt (sodium) intake to 2,300 mg a day. If  you have hypertension, you may need to reduce your sodium intake to 1,500 mg a day.  When on the DASH eating plan, aim to eat more fresh fruits and vegetables, whole grains, lean proteins, low-fat dairy, and heart-healthy fats.  Work with your health care provider or diet and nutrition specialist (dietitian) to adjust your eating plan to your individual calorie needs. This information is not intended to replace advice given to you by your health  care provider. Make sure you discuss any questions you have with your health care provider. Document Revised: 10/15/2017 Document Reviewed: 10/26/2016 Elsevier Patient Education  2020 [employer].

## 2020-06-17 ENCOUNTER — Other Ambulatory Visit: Payer: Self-pay

## 2020-06-17 LAB — HEPATITIS C ANTIBODY
Hepatitis C Ab: NONREACTIVE
SIGNAL TO CUT-OFF: 0.01 (ref <1.00)

## 2020-06-17 LAB — HIV ANTIBODY (ROUTINE TESTING W REFLEX): HIV 1&2 Ab, 4th Generation: NONREACTIVE

## 2020-06-17 MED ORDER — TRUE METRIX LEVEL 1 LOW VI SOLN: 1.0000 | 0 refills | Status: DC | PRN

## 2020-06-19 ENCOUNTER — Other Ambulatory Visit: Payer: Self-pay | Admitting: Nurse Practitioner

## 2020-06-19 ENCOUNTER — Telehealth: Payer: Self-pay

## 2020-06-19 ENCOUNTER — Ambulatory Visit: Payer: Medicare HMO | Admitting: Nurse Practitioner

## 2020-06-19 DIAGNOSIS — E7800 Pure hypercholesterolemia, unspecified: Secondary | ICD-10-CM

## 2020-06-19 NOTE — Telephone Encounter (Signed)
Patient states she has a "sinus infection" and needs something for it. Her only symptom is clear mucus drainage "coming out." No fever or any other symptoms. She stated she had this issue when she saw you last week but didn't think to say anything about it.

## 2020-06-19 NOTE — Telephone Encounter (Signed)
Called patient and gave her the recommendations made. After telling her about the nasal spray she stated it wasn't going to work. I told her I had other recommendations and she was not happy with any recommendation, that those things were not going to work, that it was  like I had not even asked you about her issue. She asked how she was going to get the medications. I told her that everything was OTC. She stated that "she needed something that was going to clear this up, that I had been dealing with this for a long time." She told me earlier that she had not mentioned it when she was seen in the office on 06/14/20.

## 2020-06-19 NOTE — Telephone Encounter (Signed)
For sinus issues I would recommend  Plain nasal saline spray throughout the day as needed (for sinus congestion) May use tylenol 325 mg 2 tablets every 6 hours as needed for aches and pains or sore throat Mucinex DM by mouth twice daily as needed for cough and congestion with full glass of water  Keep well hydrated Avoid forcefully blowing nose Can also use OTC claritin or zyrtec 10 mg for nasal drainage.

## 2020-06-24 ENCOUNTER — Other Ambulatory Visit: Payer: Self-pay

## 2020-06-24 NOTE — Progress Notes (Signed)
This encounter was created in error - please disregard.

## 2020-06-24 NOTE — Addendum Note (Signed)
Addended by: Carroll Kinds on: 06/24/2020 12:31 PM   Modules accepted: Level of Service, SmartSet

## 2020-07-02 ENCOUNTER — Encounter: Payer: Self-pay | Admitting: Nurse Practitioner

## 2020-07-08 ENCOUNTER — Telehealth: Payer: Self-pay | Admitting: *Deleted

## 2020-07-08 DIAGNOSIS — I10 Essential (primary) hypertension: Secondary | ICD-10-CM

## 2020-07-08 DIAGNOSIS — E7800 Pure hypercholesterolemia, unspecified: Secondary | ICD-10-CM

## 2020-07-08 MED ORDER — ATORVASTATIN CALCIUM 20 MG PO TABS: 20.0000 mg | ORAL_TABLET | Freq: Every day | ORAL | 1 refills | Status: DC

## 2020-07-08 MED ORDER — LABETALOL HCL 300 MG PO TABS: 600.0000 mg | ORAL_TABLET | Freq: Three times a day (TID) | ORAL | 1 refills | Status: DC

## 2020-07-08 NOTE — Telephone Encounter (Signed)
Patient called back and requested Rx's to be sent to [facility].  Pended Rx and sent to [facility] for approval due to [place].

## 2020-07-08 NOTE — Telephone Encounter (Signed)
Received refill Request from [facility]. Not in patient's list of pharmacies.  Called and left message for patient to return call.   Regarding if she wants her Rx's sent to [facility].  Awaiting call back.

## 2020-07-10 ENCOUNTER — Other Ambulatory Visit: Payer: Self-pay | Admitting: *Deleted

## 2020-07-10 DIAGNOSIS — Z853 Personal history of malignant neoplasm of breast: Secondary | ICD-10-CM

## 2020-07-10 MED ORDER — ANASTROZOLE 1 MG PO TABS: 1.0000 mg | ORAL_TABLET | Freq: Every day | ORAL | 3 refills | Status: DC

## 2020-07-17 ENCOUNTER — Ambulatory Visit: Payer: Medicare HMO | Admitting: Cardiovascular Disease

## 2020-07-24 ENCOUNTER — Other Ambulatory Visit: Payer: Self-pay | Admitting: *Deleted

## 2020-07-24 DIAGNOSIS — G4700 Insomnia, unspecified: Secondary | ICD-10-CM

## 2020-07-24 MED ORDER — ESZOPICLONE 2 MG PO TABS: 2.0000 mg | ORAL_TABLET | Freq: Every evening | ORAL | 0 refills | Status: DC | PRN

## 2020-07-24 NOTE — Telephone Encounter (Signed)
Patient requested refill Pended Rx and sent to [facility] for approval. [name] out of office)

## 2020-07-29 ENCOUNTER — Other Ambulatory Visit: Payer: Self-pay

## 2020-07-29 ENCOUNTER — Ambulatory Visit (INDEPENDENT_AMBULATORY_CARE_PROVIDER_SITE_OTHER): Payer: Medicare HMO | Admitting: Cardiovascular Disease

## 2020-07-29 ENCOUNTER — Encounter: Payer: Self-pay | Admitting: Cardiovascular Disease

## 2020-07-29 VITALS — BP 142/84 | HR 72 | Ht 62.0 in | Wt 198.0 lb

## 2020-07-29 DIAGNOSIS — I10 Essential (primary) hypertension: Secondary | ICD-10-CM | POA: Diagnosis not present

## 2020-07-29 DIAGNOSIS — I129 Hypertensive chronic kidney disease with stage 1 through stage 4 chronic kidney disease, or unspecified chronic kidney disease: Secondary | ICD-10-CM

## 2020-07-29 DIAGNOSIS — E782 Mixed hyperlipidemia: Secondary | ICD-10-CM

## 2020-07-29 MED ORDER — HYDRALAZINE HCL 25 MG PO TABS
25.0000 mg | ORAL_TABLET | Freq: Three times a day (TID) | ORAL | 1 refills | Status: DC
Start: 2020-07-29 — End: 2020-08-22

## 2020-07-29 MED ORDER — AMLODIPINE BESYLATE 10 MG PO TABS: 10.0000 mg | ORAL_TABLET | Freq: Every day | ORAL | 1 refills | Status: DC

## 2020-07-29 NOTE — Patient Instructions (Addendum)
Medication Instructions:   STOP NIFEDIPINE   START AMLODIPINE 10 MG DAILY  START HYDRALAZINE 25 MG 3 TIMES A DAY *If you need a refill on your cardiac medications before your next appointment, please call your pharmacy*   Lab Work: Your physician recommends that you return for lab work TODAY:   BMET If you have labs (blood work) drawn today and your tests are completely normal, you will receive your results only by: Marland Kitchen MyChart Message (if you have MyChart) OR . A paper copy in the mail If you have any lab test that is abnormal or we need to change your treatment, we will call you to review the results.   Testing/Procedures: Your physician has requested that you have a renal artery duplex. During this test, an ultrasound is used to evaluate blood flow to the kidneys. Allow one hour for this exam. Do not eat after midnight the day before and avoid carbonated beverages. Take your medications as you usually do.   PLEASE SCHEDULE FOR 1-2 WEEKS   Follow-Up: At [facility], you and your health needs are our priority.  As part of our continuing mission to provide you with exceptional heart care, we have created designated Provider Care Teams.  These Care Teams include your primary Cardiologist (physician) and Advanced Practice Providers (APPs -  Physician Assistants and Nurse Practitioners) who all work together to provide you with the care you need, when you need it.  We recommend signing up for the patient portal called "MyChart".  Sign up information is provided on this After Visit Summary.  MyChart is used to connect with patients for Virtual Visits (Telemedicine).  Patients are able to view lab/test results, encounter notes, upcoming appointments, etc.  Non-urgent messages can be sent to your provider as well.   To learn more about what you can do with MyChart, go to [email or web address].    Your next appointment:   4 week(s) 3 MONTH(S)   The format for your next appointment:     In Person [place]  Provider:   [place], MD   Other Instructions  MONITOR BLOOD PRESSURE FOR 4 WEEK. BRING LOG WITH YOU TO PHARM D APPOINTMENT

## 2020-07-29 NOTE — Progress Notes (Signed)
07/29/2020 Michele Page   30-Apr-1956  [id number]  Primary Physician [name], NP Primary Cardiologist: Lorretta Harp MD Renae Gloss  HPI:  Michele Page is a 64 y.o.  moderately overweight married African-American female mother of 3 children, grandmother of 33 grandchildrenwh[address] from [place] to [place] 5 years ago. She is retired from working in her own daycare.I last saw her in the office 09/29/2019.  Is a history of hypertension and hypokalemia. She's had a stroke 4 years ago leaving her with some motor and cognitive deficit deficits. She's never had a heart attack. She denies chest pain or shortness of breath. She wasreferred here for further management of her hypertension.Since I saw her a year ago her blood pressure has remained well controlled on her current medical regimen.  Since I saw her in the office almost a year ago she does complain of elevated blood pressure.  She denies chest pain or shortness of breath..   Current Meds  Medication Sig  . anastrozole (ARIMIDEX) 1 MG tablet Take 1 tablet (1 mg total) by mouth daily.  Marland Kitchen aspirin EC 81 MG tablet Take 1 tablet (81 mg total) by mouth daily.  Marland Kitchen atorvastatin (LIPITOR) 20 MG tablet Take 1 tablet (20 mg total) by mouth daily.  . Blood Glucose Calibration (TRUE METRIX LEVEL 1) Low SOLN 1 Bottle by In Vitro route as needed.  . labetalol (NORMODYNE) 300 MG tablet Take 2 tablets (600 mg total) by mouth 3 (three) times daily.  . [DISCONTINUED] NIFEdipine (PROCARDIA XL/NIFEDICAL XL) 60 MG 24 hr tablet Take 1 tablet (60 mg total) by mouth 2 (two) times daily.     No Known Allergies  Social History   Socioeconomic History  . Marital status: Married    Spouse name: Not on file  . Number of children: 3  . Years of education: Not on file  . Highest education level: Not on file  Occupational History  . Occupation: retired  Tobacco Use  . Smoking status: Former Smoker    Packs/day:  0.50    Years: 25.00    Pack years: 12.50    Types: Cigarettes    Quit date: 11/16/2004    Years since quitting: 15.7  . Smokeless tobacco: Never Used  Vaping Use  . Vaping Use: Never used  Substance and Sexual Activity  . Alcohol use: Yes    Alcohol/week: 0.0 standard drinks    Comment: occas  . Drug use: No  . Sexual activity: Yes    Comment: 1st intercourse 46 yo-5 partners  Other Topics Concern  . Not on file  Social History Narrative   Diet- N/A   Caffeine- Yes   Married- Yes   House- 2 story with 2 people   Pets- No   Current/past profession- Nurse, mental health   Exercise- Yes   Living will-No   DNR-N/A   POA/HPOA-No      Social Determinants of Health   Financial Resource Strain:   . Difficulty of Paying Living Expenses: Not on file  Food Insecurity:   . Worried About Charity fundraiser in the Last Year: Not on file  . Ran Out of Food in the Last Year: Not on file  Transportation Needs:   . Lack of Transportation (Medical): Not on file  . Lack of Transportation (Non-Medical): Not on file  Physical Activity:   . Days of Exercise per Week: Not on file  . Minutes of Exercise  per Session: Not on file  Stress:   . Feeling of Stress : Not on file  Social Connections:   . Frequency of Communication with Friends and Family: Not on file  . Frequency of Social Gatherings with Friends and Family: Not on file  . Attends Religious Services: Not on file  . Active Member of Clubs or Organizations: Not on file  . Attends Archivist Meetings: Not on file  . Marital Status: Not on file  Intimate Partner Violence:   . Fear of Current or Ex-Partner: Not on file  . Emotionally Abused: Not on file  . Physically Abused: Not on file  . Sexually Abused: Not on file     Review of Systems: General: negative for chills, fever, night sweats or weight changes.  Cardiovascular: negative for chest pain, dyspnea on exertion, edema, orthopnea, palpitations, paroxysmal  nocturnal dyspnea or shortness of breath Dermatological: negative for rash Respiratory: negative for cough or wheezing Urologic: negative for hematuria Abdominal: negative for nausea, vomiting, diarrhea, bright red blood per rectum, melena, or hematemesis Neurologic: negative for visual changes, syncope, or dizziness All other systems reviewed and are otherwise negative except as noted above.    Blood pressure (!) 142/84, pulse 72, height 5\' 2"  (1.575 m), weight 198 lb (89.8 kg).  General appearance: alert and no distress Neck: no adenopathy, no carotid bruit, no JVD, supple, symmetrical, trachea midline and thyroid not enlarged, symmetric, no tenderness/mass/nodules Lungs: clear to auscultation bilaterally Heart: regular rate and rhythm, S1, S2 normal, no murmur, click, rub or gallop Extremities: extremities normal, atraumatic, no cyanosis or edema Pulses: 2+ and symmetric Skin: Skin color, texture, turgor normal. No rashes or lesions Neurologic: Alert and oriented X 3, normal strength and tone. Normal symmetric reflexes. Normal coordination and gait  EKG sinus rhythm at 72 with nonspecific ST and T wave changes.  I personally reviewed this EKG.  ASSESSMENT AND PLAN:   Accelerated hypertension [name] returns today for follow-up of blood pressure.  Its been elevated at home.  I am to check a basic metabolic panel today, discontinue her Procardia and start Norvasc 10 mg a day as well as hydralazine 25 mg p.o. 3 times daily.  Will check renal Doppler studies.  I will have her keep a blood pressure log and see a Pharm.D. back in 4 weeks and me in 3 months.  Hyperlipidemia History of hyperlipidemia on statin therapy with lipid profile performed 05/05/2020 revealing total cholesterol 137, LDL 55 and HDL of 70.      Lorretta Harp MD FACP,FACC,FAHA, [facility] 07/29/2020 4:29 PM

## 2020-07-29 NOTE — Assessment & Plan Note (Signed)
History of hyperlipidemia on statin therapy with lipid profile performed 05/05/2020 revealing total cholesterol 137, LDL 55 and HDL of 70.

## 2020-07-29 NOTE — Assessment & Plan Note (Signed)
Michele Page returns today for follow-up of blood pressure.  Its been elevated at home.  I am to check a basic metabolic panel today, discontinue her Procardia and start Norvasc 10 mg a day as well as hydralazine 25 mg p.o. 3 times daily.  Will check renal Doppler studies.  I will have her keep a blood pressure log and see a Pharm.D. back in 4 weeks and me in 3 months.

## 2020-07-30 LAB — BASIC METABOLIC PANEL
BUN/Creatinine Ratio: 13 (ref 12–28)
BUN: 23 mg/dL (ref 8–27)
CO2: 25 mmol/L (ref 20–29)
Calcium: 9.9 mg/dL (ref 8.7–10.3)
Chloride: 106 mmol/L (ref 96–106)
Creatinine, Ser: 1.79 mg/dL — ABNORMAL HIGH (ref 0.57–1.00)
GFR calc Af Amer: 34 mL/min/{1.73_m2} — ABNORMAL LOW (ref >59)
GFR calc non Af Amer: 30 mL/min/{1.73_m2} — ABNORMAL LOW (ref >59)
Glucose: 107 mg/dL — ABNORMAL HIGH (ref 65–99)
Potassium: 4 mmol/L (ref 3.5–5.2)
Sodium: 146 mmol/L — ABNORMAL HIGH (ref 134–144)

## 2020-08-16 ENCOUNTER — Other Ambulatory Visit: Payer: Self-pay

## 2020-08-16 ENCOUNTER — Other Ambulatory Visit: Payer: Self-pay | Admitting: Cardiovascular Disease

## 2020-08-16 ENCOUNTER — Other Ambulatory Visit: Payer: Self-pay | Admitting: Nurse Practitioner

## 2020-08-16 ENCOUNTER — Ambulatory Visit (HOSPITAL_COMMUNITY)
Admission: RE | Admit: 2020-08-16 | Discharge: 2020-08-16 | Disposition: A | Discharge status: Home / Self Care | Payer: Medicare HMO | Source: Ambulatory Visit | Attending: Cardiovascular Disease | Admitting: Cardiovascular Disease

## 2020-08-16 ENCOUNTER — Telehealth: Payer: Self-pay | Admitting: *Deleted

## 2020-08-16 DIAGNOSIS — I129 Hypertensive chronic kidney disease with stage 1 through stage 4 chronic kidney disease, or unspecified chronic kidney disease: Secondary | ICD-10-CM | POA: Insufficient documentation

## 2020-08-16 DIAGNOSIS — I10 Essential (primary) hypertension: Secondary | ICD-10-CM

## 2020-08-16 NOTE — Telephone Encounter (Signed)
Patient called requesting a refill on Nifedipine 60mg  twice daily.  Medication is not in patient's current medication list.  Reviewed chart and Dr. Gwenlyn Found (Cardiologist) DISCONTINUED the medication on 07/29/2020.   I recommended patient to call his office for clarification. She stated that she will call his office.      Lorretta Harp MD FACP,FACC,FAHA, [facility] 07/29/2020 4:29 PM    Patient Instructions by Jacqulynn Cadet, [place] at 07/29/2020 3:30 PM Author: Jacqulynn Cadet, CMA Author Type: Certified Medical Assistant Filed: 07/29/2020 4:38 PM  Note Status: Addendum Cosign: Cosign Not Required Encounter Date: 07/29/2020  Editor: Jacqulynn Cadet, [place] (Certified Psychologist, sport and exercise)      Prior Versions: 1. Jacqulynn Cadet, [place] (Certified Medical Assistant) at 07/29/2020 4:32 PM - Signed    Medication Instructions:   STOP NIFEDIPINE   START AMLODIPINE 10 MG DAILY  START HYDRALAZINE 25 MG 3 TIMES A DAY *If you need a refill on your cardiac medications before your next appointment, please call your pharmacy

## 2020-08-16 NOTE — Telephone Encounter (Signed)
°*  STAT* If patient is at the pharmacy, call can be transferred to refill team.   1. Which medications need to be refilled? (please list name of each medication and dose if known) NIFEdipine (ADALAT CC) 60 MG 24 hr tablet   2. Which pharmacy/location (including street and city if local pharmacy) is medication to be sent to?[place] ([place]), [place] [address]  3. Do they need a 30 day or 90 day supply? 30 days Out of meds

## 2020-08-16 NOTE — Telephone Encounter (Signed)
Agreed -

## 2020-08-22 ENCOUNTER — Other Ambulatory Visit: Payer: Self-pay | Admitting: Pharmacist

## 2020-08-22 MED ORDER — NIFEDIPINE ER OSMOTIC RELEASE 60 MG PO TB24: 60.0000 mg | ORAL_TABLET | Freq: Every day | ORAL | 0 refills | Status: DC

## 2020-08-22 NOTE — Telephone Encounter (Signed)
Spoke to patient. She states she  Stop taking  Amlodipine 10 mg and Hydralazine 25 mg  Three times a day . Patient states that these medication did not work to decrease her blood pressures. Patient states she had informed this to Dr Gwenlyn Found and he told her to  Restart the Nifedipine .  No documentation noted. Patient states she only used the new medication for one day and then restarted the Nifedipine. She states blood pressure have been running  Systolic number  612, 244, 132 she did give dastolic numbers.   Patient is out of  Nifedipine as of yesterday   blood pressure  Yesterday morning 158/68 and @2  pm 165/85  , this morning 150 /130.  informed patient RN  Would have to discuss with [facility] pharmacist due to Dr Gwenlyn Found is not in the office.  Since no documentation.  RN spoke to Pharmacist _ Nifedipine e-sent to pharmacy - patient to keep up coming appointment  Patient is aware.

## 2020-08-22 NOTE — Telephone Encounter (Signed)
Patient calling back. She states she is out of her medication and has not heard anything in a week.

## 2020-08-29 ENCOUNTER — Ambulatory Visit (INDEPENDENT_AMBULATORY_CARE_PROVIDER_SITE_OTHER): Payer: Medicare HMO | Admitting: Pharmacist Clinician (PhC)/ Clinical Pharmacy Specialist

## 2020-08-29 ENCOUNTER — Other Ambulatory Visit: Payer: Self-pay

## 2020-08-29 DIAGNOSIS — I10 Essential (primary) hypertension: Secondary | ICD-10-CM | POA: Diagnosis not present

## 2020-08-29 NOTE — Assessment & Plan Note (Signed)
Patient with essential hypertension, not well controlled.  Had a long discussion with the patient about taking medications consistently on a daily basis.  We cannot determine what works and what doesn't if she only takes meds when the numbers start to increase, or she changes the med routine on a weekly basis.  Stressed to her that she will need to take labetalol 600 mg twice daily EVERY DAY for the next month, as well as only 1 tablet of nifedipine xl 60 mg.  She should only take her BP twice daily, and not adjust her medications unless the systolic pressure starts to go below 100.  We will see her back in one month, at which time she needs to bring her home cuff for verification, as well a home readings.  Patient seemed hesitant with plan but stated she would try.

## 2020-08-29 NOTE — Patient Instructions (Signed)
Return for a a follow up appointment November 11 at 8 am  Check your blood pressure at home twice daily and keep record of the readings.  Take your BP meds as follows:  Take labetalol 600 mg every morning (7-8 am) and night (9-10 pm)  Take nifedipine xl 60 mg ONCE DAILY at 2:30 pm  Bring all of your meds, your BP cuff and your record of home blood pressures to your next appointment.  Exercise as you're able, try to walk approximately 30 minutes per day.  Keep salt intake to a minimum, especially watch canned and prepared boxed foods.  Eat more fresh fruits and vegetables and fewer canned items.  Avoid eating in fast food restaurants.    HOW TO TAKE YOUR BLOOD PRESSURE: . Rest 5 minutes before taking your blood pressure. .  Don't smoke or drink caffeinated beverages for at least 30 minutes before. . Take your blood pressure before (not after) you eat. . Sit comfortably with your back supported and both feet on the floor (don't cross your legs). . Elevate your arm to heart level on a table or a desk. . Use the proper sized cuff. It should fit smoothly and snugly around your bare upper arm. There should be enough room to slip a fingertip under the cuff. The bottom edge of the cuff should be 1 inch above the crease of the elbow. . Ideally, take 3 measurements at one sitting and record the average.

## 2020-08-29 NOTE — Progress Notes (Signed)
08/29/2020 Michele Page [birth year] [id number]   HPI:  Michele Page is a 64 y.o. female patient of Dr Gwenlyn Found, with a [place] below who presents today for hypertension clinic evaluation.  She was seen by Dr. Gwenlyn Found last month with a BP of 142/84.  At that time she was not compliant with medications, which consisted of labetalol 300 mg tid and nifedipine xl 60 mg qd.  She would take the nifedipine most days, but rarely took more than 2-3 tablets of the labetalol daily.  And when she did, it was at varying times of the day.  By how she described usage, it was hard to get a picture of what she was actually taking.  Dr. Gwenlyn Found switched the nifedipine xl to amlodipine 10 mg and added hydralazine 25 mg tid.  She states she took these for 3 weeks but they "didn't work", so she went back to nifedipine.  She also admits that the nifedipine worked best and many days would take at 2:30 in the afternoon, then a second tablet at bedtime.  She thought the morning doses of labetalol worked well, but finally admitted that she checked her BP a couple of hours after taking, and really didn't know if her pressure was already good in the mornings before med.  In short, it is difficult to determine if she has taken the same meds, at the same times, any 2 days in a row.   Renal artery dopplers show no indication of stenosis, and although he mentions issues with hypokalemia in her past her current potassium level is good - will defer testing for hyperaldosteronism at this time.    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  CKD  GFR 34  hyperlipidemia 6/21 LDL 55 on atorvastatin 20 mg qd  Hx of breast cancer On anastrazole     Blood Pressure Goal:  130/80  Current Medications: labetalol 600 mg tid,   Only taking nefedipine xl 60 around 2:30 and 600 qam  Taking second nifedipine at bedtime - total of 120 mg daily some days  Family Hx: mother with hypertension died at 30 from stroke; father  died from [place] at 1 35;  Sister - died from hypertension/stroke, 3 brothers deceased (85 maybe from heart disease); All 3 children with hypertension  Social Hx: former smoker  Diet: mostly home cooked meals, no added salt; occasional boxed foods, some from scratch; vegetables (fresh or frozen) daily; not much meats, more chicken or [place], beans;    Exercise: walk daily   Home BP readings: mornings usually good 135/80 ish per patient, after 12-1 pm becomes uncontrolled 175-190/diastolic mostly WNL.    Intolerances: nkda  Labs: 9/21: Na 146, K 4.0, Glu 107, BUN 23, SCr 1.79, GFR 34  Wt Readings from Last 3 Encounters:  08/29/20 199 lb 12.8 oz (90.6 kg)  07/29/20 198 lb (89.8 kg)  06/14/20 200 lb 6.4 oz (90.9 kg)   BP Readings from Last 3 Encounters:  08/29/20 130/84  07/29/20 (!) 142/84  06/14/20 (!) 132/74   Pulse Readings from Last 3 Encounters:  08/29/20 77  07/29/20 72  06/14/20 68    Current Outpatient Medications  Medication Sig Dispense Refill   anastrozole (ARIMIDEX) 1 MG tablet Take 1 tablet (1 mg total) by mouth daily. 90 tablet 3   aspirin EC 81 MG tablet Take 1 tablet (81 mg total) by mouth daily. 30 tablet 6   atorvastatin (LIPITOR) 20 MG tablet  Take 1 tablet (20 mg total) by mouth daily. 90 tablet 1   labetalol (NORMODYNE) 300 MG tablet Take 2 tablets (600 mg total) by mouth 3 (three) times daily. 540 tablet 1   NIFEdipine (PROCARDIA XL/NIFEDICAL XL) 60 MG 24 hr tablet Take 1 tablet (60 mg total) by mouth daily. 90 tablet 0   No current facility-administered medications for this visit.    No Known Allergies  Past Medical History:  Diagnosis Date   Arthritis    lt knee   Breast cancer ([place])    Breast cancer of upper-outer quadrant of left female breast ([place]) 04/02/2016   Colon polyps    COPD (chronic obstructive pulmonary disease) ([name])    Hyperlipidemia    Hypertension    Multiple sclerosis ([place])    Personal history of chemotherapy     Stroke ([place])    2013, mild cognitive deficits   Vision abnormalities     Blood pressure 130/84, pulse 77, resp. rate 16, height 5\' 3"  (1.6 m), weight 199 lb 12.8 oz (90.6 kg), SpO2 97 %.  Essential hypertension Patient with essential hypertension, not well controlled.  Had a long discussion with the patient about taking medications consistently on a daily basis.  We cannot determine what works and what doesn't if she only takes meds when the numbers start to increase, or she changes the med routine on a weekly basis.  Stressed to her that she will need to take labetalol 600 mg twice daily EVERY DAY for the next month, as well as only 1 tablet of nifedipine xl 60 mg.  She should only take her BP twice daily, and not adjust her medications unless the systolic pressure starts to go below 100.  We will see her back in one month, at which time she needs to bring her home cuff for verification, as well a home readings.  Patient seemed hesitant with plan but stated she would try.     Tommy Medal PharmD CPP [facility] [address] [place], [place] [address] [phone]

## 2020-09-12 DIAGNOSIS — H5203 Hypermetropia, bilateral: Secondary | ICD-10-CM | POA: Diagnosis not present

## 2020-09-13 ENCOUNTER — Ambulatory Visit: Payer: Medicare HMO | Admitting: Nurse Practitioner

## 2020-09-19 ENCOUNTER — Ambulatory Visit (INDEPENDENT_AMBULATORY_CARE_PROVIDER_SITE_OTHER): Payer: Medicare HMO | Admitting: Internal Medicine

## 2020-09-19 ENCOUNTER — Encounter: Payer: Self-pay | Admitting: Internal Medicine

## 2020-09-19 ENCOUNTER — Other Ambulatory Visit: Payer: Self-pay

## 2020-09-19 VITALS — BP 142/82 | HR 78 | Temp 97.5°F | Ht 63.0 in | Wt 200.0 lb

## 2020-09-19 DIAGNOSIS — M778 Other enthesopathies, not elsewhere classified: Secondary | ICD-10-CM

## 2020-09-19 DIAGNOSIS — M25611 Stiffness of right shoulder, not elsewhere classified: Secondary | ICD-10-CM | POA: Diagnosis not present

## 2020-09-19 DIAGNOSIS — F5101 Primary insomnia: Secondary | ICD-10-CM | POA: Diagnosis not present

## 2020-09-19 DIAGNOSIS — M542 Cervicalgia: Secondary | ICD-10-CM

## 2020-09-19 MED ORDER — ESZOPICLONE 2 MG PO TABS: 2.0000 mg | ORAL_TABLET | Freq: Every evening | ORAL | 0 refills | Status: DC | PRN

## 2020-09-19 MED ORDER — ACETAMINOPHEN 500 MG PO TABS: 500.0000 mg | ORAL_TABLET | Freq: Two times a day (BID) | ORAL | 0 refills | Status: DC

## 2020-09-19 NOTE — Progress Notes (Signed)
Location:  [facility] clinic Provider: Yianni Skilling L. Mariea Clonts, D.O., C.M.D.  Goals of Care:  Advanced Directives 09/19/2020  Does Patient Have a Medical Advance Directive? No  Would patient like information on creating a medical advance directive? No - Patient declined   Chief Complaint  Patient presents with  . Acute Visit    Right arm pain x 2 months she describes the pain as tighness and stiff  Discuss the need for sleeping pills  . Health Maintenance    dexa scan, tetanus, colonoscopy, Influenza and covid . Patient declined on covid vac and flu shot     HPI: Patient is a 64 y.o. female pt of NP [place] seen today for an acute visit for right arm pain for 2 months--tightness and stiffness.  Thinks it comes from work.  Cleans bathrooms, sweeps.  She's right-handed.  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  She cannot write with the right.  Bothers her most when she's not doing anything.  Feels it if she pushes on it.  It's very very tight.  When she puts it down, it's not as tight.  No numb, tingly, burning.  Does not bother her at night unless she finds the sore area in her forearm.  Hasn't done anything for her pain thus far--no meds taken.  Bothers her every now and then, ignores it.  Getting so she cannot ignore it b/c of the shoulder portion.   Right neck hurts--worse in the am.  Tried to go to one pillow but no luck.    Refuses flu shot and covid vaccines.  Due for cscope, tetanus and dexa to be addressed during routine care.  Past Medical History:  Diagnosis Date  . Arthritis    lt knee  . Breast cancer ([place])   . Breast cancer of upper-outer quadrant of left female breast ([place]) 04/02/2016  . Colon polyps   . COPD (chronic obstructive pulmonary disease) ([place])   . Hyperlipidemia   . Hypertension   . Multiple sclerosis ([place])   . Personal history of chemotherapy   . Stroke ([place])    2013, mild cognitive deficits  . Vision abnormalities     Past Surgical History:  Procedure  Laterality Date  . BREAST BIOPSY Left 03/31/2016  . BREAST LUMPECTOMY Left 04/17/2016  . HYSTEROSCOPY WITH D & C N/A 12/19/2015   Procedure: DILATATION AND CURETTAGE /HYSTEROSCOPY;  Surgeon: Linda Hedges, DO;  Location: [facility];  Service: Gynecology;  Laterality: N/A;  . RADIOACTIVE SEED GUIDED PARTIAL MASTECTOMY WITH AXILLARY SENTINEL LYMPH NODE BIOPSY Left 04/17/2016   Procedure: RADIOACTIVE SEED GUIDED PARTIAL MASTECTOMY WITH AXILLARY SENTINEL LYMPH NODE BIOPSY;  Surgeon: Erroll Luna, MD;  Location: [facility];  Service: General;  Laterality: Left;  RADIOACTIVE SEED GUIDED PARTIAL MASTECTOMY WITH AXILLARY SENTINEL LYMPH NODE BIOPSY   . TUBAL LIGATION    . VAGINAL DELIVERY     x3    No Known Allergies  Outpatient Encounter Medications as of 09/19/2020  Medication Sig  . anastrozole (ARIMIDEX) 1 MG tablet Take 1 tablet (1 mg total) by mouth daily.  Marland Kitchen aspirin EC 81 MG tablet Take 1 tablet (81 mg total) by mouth daily.  Marland Kitchen atorvastatin (LIPITOR) 20 MG tablet Take 1 tablet (20 mg total) by mouth daily.  Marland Kitchen labetalol (NORMODYNE) 300 MG tablet Take 2 tablets (600 mg total) by mouth 3 (three) times daily.  Marland Kitchen NIFEdipine (PROCARDIA XL/NIFEDICAL XL) 60 MG 24 hr tablet Take 1 tablet (60  mg total) by mouth daily.   No facility-administered encounter medications on file as of 09/19/2020.    Review of Systems:  Review of Systems  Constitutional: Negative for chills, fever and malaise/fatigue.  HENT: Negative for congestion.   Eyes: Negative for blurred vision.  Respiratory: Negative for cough and shortness of breath.   Cardiovascular: Negative for chest pain, palpitations and leg swelling.  Gastrointestinal: Negative for abdominal pain.  Genitourinary: Negative for dysuria.  Musculoskeletal: Positive for joint pain, myalgias and neck pain. Negative for falls.  Neurological: Positive for weakness. Negative for tingling, sensory change and loss of consciousness.       Some weakness of  right hand, no longer able to write when she once was after her stroke  Psychiatric/Behavioral: Positive for memory loss. Negative for depression. The patient is not nervous/anxious and does not have insomnia.     Health Maintenance  Topic Date Due  . COVID-19 Vaccine (1) Never done  . TETANUS/TDAP  Never done  . COLONOSCOPY  07/07/2018  . INFLUENZA VACCINE  06/16/2020  . DEXA SCAN  Never done  . PNA vac Low Risk Adult (1 of 2 - PCV13) Never done  . PAP SMEAR-Modifier  05/02/2021  . MAMMOGRAM  12/06/2021  . Hepatitis C Screening  Completed  . HIV Screening  Completed    Physical Exam: Vitals:   09/19/20 0826  BP: (!) 142/82  Pulse: 78  Temp: (!) 97.5 F (36.4 C)  TempSrc: Temporal  SpO2: 97%  Weight: 200 lb (90.7 kg)  Height: 5\' 3"  (1.6 m)   Body mass index is 35.43 kg/m. Physical Exam Vitals reviewed.  Constitutional:      General: She is not in acute distress.    Appearance: Normal appearance. She is not toxic-appearing.  HENT:     Head: Normocephalic and atraumatic.  Cardiovascular:     Rate and Rhythm: Normal rate and regular rhythm.  Pulmonary:     Effort: Pulmonary effort is normal.     Breath sounds: Normal breath sounds. No wheezing, rhonchi or rales.  Musculoskeletal:        General: Normal range of motion.  Neurological:     Mental Status: She is alert.     Sensory: No sensory deficit.     Gait: Gait normal.     Comments: Right hand grip weakness, full mobility of shoulder with pain in rotator cuff, tenderness and swelling in forearm; tenderness over trapezius muscle     Labs reviewed: Basic Metabolic Panel: Recent Labs    11/13/19 0957 05/13/20 0930 07/29/20 1655  NA 143 146 146*  K 4.0 4.3 4.0  CL 107 109 106  CO2 25 27 25   GLUCOSE 89 101* 107*  BUN 22 23 23   CREATININE 1.22* 1.43* 1.79*  CALCIUM 10.0 10.1 9.9   Liver Function Tests: Recent Labs    11/13/19 0957 05/13/20 0930  AST 23 19  ALT 21 20  BILITOT 0.5 0.5  PROT 7.1 7.0    No results for input(s): LIPASE, AMYLASE in the last 8760 hours. No results for input(s): AMMONIA in the last 8760 hours. CBC: Recent Labs    11/13/19 0957 05/13/20 0930  WBC 4.5 5.4  NEUTROABS 2,655 3,386  HGB 12.7 12.0  HCT 37.4 35.3  MCV 91.4 92.2  PLT 301 272   Lipid Panel: Recent Labs    05/13/20 0930  CHOL 137  HDL 70  LDLCALC 55  TRIG 50  CHOLHDL 2.0   Lab Results  Component  Value Date   HGBA1C 5.8 (H) 05/13/2020    Assessment/Plan 1. Neck pain on right side - Ambulatory referral to Physical Therapy - acetaminophen (TYLENOL) 500 MG tablet; Take 1 tablet (500 mg total) by mouth in the morning and at bedtime.  Dispense: 28 tablet; Refill: 0 -ice  2. Right elbow tendonitis - ice - Ambulatory referral to Physical Therapy - acetaminophen (TYLENOL) 500 MG tablet; Take 1 tablet (500 mg total) by mouth in the morning and at bedtime.  Dispense: 28 tablet; Refill: 0  3. Decreased right shoulder range of motion - ice - Ambulatory referral to Physical Therapy - acetaminophen (TYLENOL) 500 MG tablet; Take 1 tablet (500 mg total) by mouth in the morning and at bedtime.  Dispense: 28 tablet; Refill: 0  4. Primary insomnia -uses rarely--has not gotten refilled since 2020 - eszopiclone (LUNESTA) 2 MG TABS tablet; Take 1 tablet (2 mg total) by mouth at bedtime as needed for sleep. Take immediately before bedtime  Dispense: 10 tablet; Refill: 0  Labs/tests ordered:  No new at this time  Next appt:  PRN if not improving in 2 wks  Oria Klimas L. Othello Dickenson, D.O. [facility] [address], [place] [address] Cell Phone (Mon-Fri 8am-5pm):  [phone] On Call:  [phone] & follow prompts after 5pm & weekends Office Phone:  [phone] Office Fax:  [phone]

## 2020-09-19 NOTE — Patient Instructions (Signed)
Start tylenol 500mg  twice a day for the next 2 weeks. Apply ice for 20 mins up to 4 times a day on your right shoulder and forearm. I'll send you for physical therapy. If you are not improving after a couple of weeks, call us back to be seen again and have some xrays done.

## 2020-09-26 ENCOUNTER — Ambulatory Visit (INDEPENDENT_AMBULATORY_CARE_PROVIDER_SITE_OTHER): Payer: Medicare HMO | Admitting: Pharmacist Clinician (PhC)/ Clinical Pharmacy Specialist

## 2020-09-26 ENCOUNTER — Other Ambulatory Visit: Payer: Self-pay

## 2020-09-26 DIAGNOSIS — I10 Essential (primary) hypertension: Secondary | ICD-10-CM | POA: Diagnosis not present

## 2020-09-26 MED ORDER — NIFEDIPINE ER OSMOTIC RELEASE 90 MG PO TB24: 90.0000 mg | ORAL_TABLET | Freq: Every day | ORAL | 6 refills | Status: DC

## 2020-09-26 NOTE — Progress Notes (Signed)
09/26/2020 Michele Page January 13, 1956 [id number]   HPI:  Michele Page is a 64 y.o. female patient of Dr Gwenlyn Found, with a [place] below who presents today for hypertension clinic evaluation.  She was seen by Dr. Gwenlyn Found last month with a BP of 142/84.  At that time she was not compliant with medications, which consisted of labetalol 300 mg tid and nifedipine xl 60 mg qd.  She would take the nifedipine most days, but rarely took more than 2-3 tablets of the labetalol daily.  And when she did, it was at varying times of the day.  By how she described usage, it was hard to get a picture of what she was actually taking.  Dr. Gwenlyn Found switched the nifedipine xl to amlodipine 10 mg and added hydralazine 25 mg tid.  She states she took these for 3 weeks but they "didn't work", so she went back to nifedipine.  She also admits that the nifedipine worked best and many days would take at 2:30 in the afternoon, then a second tablet at bedtime.  She thought the morning doses of labetalol worked well, but finally admitted that she checked her BP a couple of hours after taking, and really didn't know if her pressure was already good in the mornings before med.  In short, it is difficult to determine if she has taken the same meds, at the same times, any 2 days in a row.   Renal artery dopplers show no indication of stenosis, and although he mentions issues with hypokalemia in her past her current potassium level is good - will defer testing for hyperaldosteronism at this time.    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  CKD  GFR 34  hyperlipidemia 6/21 LDL 55 on atorvastatin 20 mg qd  Hx of breast cancer On anastrazole     Blood Pressure Goal:  130/80  Current Medications: labetalol 600 mg bid, nifedipine xl 60 mg qd  Family Hx: mother with hypertension died at 21 from stroke; father died from [place] at 69 9;  Sister - died from hypertension/stroke, 3 brothers deceased (14 maybe from heart  disease); All 3 children with hypertension  Social Hx: former smoker  Diet: mostly home cooked meals, no added salt; occasional boxed foods, some from scratch; vegetables (fresh or frozen) daily; not much meats, more chicken or [place], beans;    Exercise: walk daily   Home BP readings: 21 morning readings average 135/80, 17 evening readings average 146/76  Intolerances: nkda  Labs: 9/21: Na 146, K 4.0, Glu 107, BUN 23, SCr 1.79, GFR 34  Wt Readings from Last 3 Encounters:  09/26/20 202 lb 3.2 oz (91.7 kg)  09/19/20 200 lb (90.7 kg)  08/29/20 199 lb 12.8 oz (90.6 kg)   BP Readings from Last 3 Encounters:  09/26/20 132/78  09/19/20 (!) 142/82  08/29/20 130/84   Pulse Readings from Last 3 Encounters:  09/26/20 84  09/19/20 78  08/29/20 77    Current Outpatient Medications  Medication Sig Dispense Refill   acetaminophen (TYLENOL) 500 MG tablet Take 1 tablet (500 mg total) by mouth in the morning and at bedtime. 28 tablet 0   anastrozole (ARIMIDEX) 1 MG tablet Take 1 tablet (1 mg total) by mouth daily. 90 tablet 3   aspirin EC 81 MG tablet Take 1 tablet (81 mg total) by mouth daily. 30 tablet 6   atorvastatin (LIPITOR) 20 MG tablet Take 1 tablet (20 mg  total) by mouth daily. 90 tablet 1   eszopiclone (LUNESTA) 2 MG TABS tablet Take 1 tablet (2 mg total) by mouth at bedtime as needed for sleep. Take immediately before bedtime 10 tablet 0   labetalol (NORMODYNE) 300 MG tablet Take 2 tablets (600 mg total) by mouth 3 (three) times daily. 540 tablet 1   NIFEdipine (PROCARDIA XL/NIFEDICAL-XL) 90 MG 24 hr tablet Take 1 tablet (90 mg total) by mouth daily. 30 tablet 6   No current facility-administered medications for this visit.    No Known Allergies  Past Medical History:  Diagnosis Date   Arthritis    lt knee   Breast cancer ([place])    Breast cancer of upper-outer quadrant of left female breast ([place]) 04/02/2016   Colon polyps    COPD (chronic obstructive pulmonary  disease) ([name])    Hyperlipidemia    Hypertension    Multiple sclerosis ([place])    Personal history of chemotherapy    Stroke ([place])    2013, mild cognitive deficits   Vision abnormalities     Blood pressure 132/78, pulse 84, resp. rate 16, height 5\' 3"  (1.6 m), weight 202 lb 3.2 oz (91.7 kg), SpO2 93 %.  Essential hypertension Patient with essential hypertension, doing better now on a regular schedule of medication.  Still having some elevated readings at night, but averages are doing better.  Will have her increase the nifedipine xl to 90 mg once daily and shift to earlier in the day.  Hopefully this will help decrease the evening readings a little better.  Stressed again that she is not in danger from a stroke with these readings, as she is concerned that readings are still way too high.  She is scheduled to see Dr. Gwenlyn Found in a month, at which time, if her readings are still elevated in the evenings, she may benefit from a 300 mg dose of labetalol around mid-day.  We can see her after that should she need continued monitoring.     Tommy Medal PharmD CPP [facility] [address] [place], [place] [address] [phone]

## 2020-09-26 NOTE — Patient Instructions (Addendum)
Return for a a follow up appointment with Dr. Gwenlyn Found on December 14  Check your blood pressure at home daily (if able) and keep record of the readings.  Take your BP meds as follows:  Increase nifedipine xl to 90 mg once daily, around 11 am.    Continue with labetalol 600 mg twice daily (morning and evening)  Bring all of your meds, your BP cuff and your record of home blood pressures to your next appointment.  Exercise as you're able, try to walk approximately 30 minutes per day.  Keep salt intake to a minimum, especially watch canned and prepared boxed foods.  Eat more fresh fruits and vegetables and fewer canned items.  Avoid eating in fast food restaurants.    HOW TO TAKE YOUR BLOOD PRESSURE: . Rest 5 minutes before taking your blood pressure. .  Don't smoke or drink caffeinated beverages for at least 30 minutes before. . Take your blood pressure before (not after) you eat. . Sit comfortably with your back supported and both feet on the floor (don't cross your legs). . Elevate your arm to heart level on a table or a desk. . Use the proper sized cuff. It should fit smoothly and snugly around your bare upper arm. There should be enough room to slip a fingertip under the cuff. The bottom edge of the cuff should be 1 inch above the crease of the elbow. . Ideally, take 3 measurements at one sitting and record the average.

## 2020-09-26 NOTE — Assessment & Plan Note (Signed)
Patient with essential hypertension, doing better now on a regular schedule of medication.  Still having some elevated readings at night, but averages are doing better.  Will have her increase the nifedipine xl to 90 mg once daily and shift to earlier in the day.  Hopefully this will help decrease the evening readings a little better.  Stressed again that she is not in danger from a stroke with these readings, as she is concerned that readings are still way too high.  She is scheduled to see Dr. Gwenlyn Found in a month, at which time, if her readings are still elevated in the evenings, she may benefit from a 300 mg dose of labetalol around mid-day.  We can see her after that should she need continued monitoring.

## 2020-10-28 ENCOUNTER — Encounter: Payer: Self-pay | Admitting: Nurse Practitioner

## 2020-10-28 ENCOUNTER — Other Ambulatory Visit: Payer: Self-pay

## 2020-10-28 ENCOUNTER — Ambulatory Visit (INDEPENDENT_AMBULATORY_CARE_PROVIDER_SITE_OTHER): Payer: Medicare HMO | Admitting: Nurse Practitioner

## 2020-10-28 ENCOUNTER — Other Ambulatory Visit: Payer: Self-pay | Admitting: Hematology and Oncology

## 2020-10-28 VITALS — BP 136/78 | HR 76 | Temp 96.8°F | Ht 63.0 in | Wt 205.0 lb

## 2020-10-28 DIAGNOSIS — Z1211 Encounter for screening for malignant neoplasm of colon: Secondary | ICD-10-CM

## 2020-10-28 DIAGNOSIS — M79645 Pain in left finger(s): Secondary | ICD-10-CM | POA: Diagnosis not present

## 2020-10-28 DIAGNOSIS — M25511 Pain in right shoulder: Secondary | ICD-10-CM

## 2020-10-28 DIAGNOSIS — E2839 Other primary ovarian failure: Secondary | ICD-10-CM | POA: Diagnosis not present

## 2020-10-28 DIAGNOSIS — M25512 Pain in left shoulder: Secondary | ICD-10-CM | POA: Diagnosis not present

## 2020-10-28 DIAGNOSIS — Z9889 Other specified postprocedural states: Secondary | ICD-10-CM

## 2020-10-28 DIAGNOSIS — Z1212 Encounter for screening for malignant neoplasm of rectum: Secondary | ICD-10-CM | POA: Diagnosis not present

## 2020-10-28 DIAGNOSIS — Z853 Personal history of malignant neoplasm of breast: Secondary | ICD-10-CM

## 2020-10-28 NOTE — Patient Instructions (Signed)
To use tylenol 500 mg 2 tablets every 8 hours as needed pain To ice hand 3 times daily ~20 mins for 1 week   To follow up if no better in 1 week   To call [phone] to schedule bone density.   To schedule AWV ( has not had in the last year) To schedule routine follow up in 1 month

## 2020-10-28 NOTE — Progress Notes (Signed)
Careteam: Patient Care Team: [name], NP as PCP - General (Geriatric Medicine) Nicholas Lose, MD as Consulting Physician (Oncology) Erroll Luna, MD as Consulting Physician (General Surgery) Thea Silversmith, MD as Consulting Physician (Radiation Oncology)  PLACE OF SERVICE:  [facility] Directive information    No Known Allergies  Chief Complaint  Patient presents with  . Acute Visit    Left hand is bothering her, patient c/o bilateral wrist, arm, and shoulder pain x 1 month.      HPI: Patient is a 64 y.o. female due to arm soreness.  Reports main concern is on her left hand thumb is sore.  Has been going on for 3 weeks. Does not stop her from doing anything but its "annyoying"  Reports thumb is about 8/10.  No pain currently.  Reports sharp pain that comes.  Reports it has happen randomly, nothing that she knows brings it on No injury.  No repetitive  movement She is right handed and left hand tender   Reports pain in shoulders as well that improve with stretches and exercises. Has been ongoing for 3 weeks. Pain in shoulder 3/10  She saw Dr Mariea Clonts back in November due to neck pain and shoulder pain but she reports she did not want to do that. "did not need it" Never took tylenol or did ice   Declines pneumonia vaccine.    Review of Systems:  Review of Systems  Constitutional: Negative for chills and fever.  Musculoskeletal: Positive for joint pain.    Past Medical History:  Diagnosis Date  . Arthritis    lt knee  . Breast cancer ([place])   . Breast cancer of upper-outer quadrant of left female breast ([place]) 04/02/2016  . Colon polyps   . COPD (chronic obstructive pulmonary disease) ([place])   . Hyperlipidemia   . Hypertension   . Multiple sclerosis ([place])   . Personal history of chemotherapy   . Stroke ([place])    2013, mild cognitive deficits  . Vision abnormalities    Past Surgical History:  Procedure Laterality Date  . BREAST BIOPSY  Left 03/31/2016  . BREAST LUMPECTOMY Left 04/17/2016  . HYSTEROSCOPY WITH D & C N/A 12/19/2015   Procedure: DILATATION AND CURETTAGE /HYSTEROSCOPY;  Surgeon: Linda Hedges, DO;  Location: [facility];  Service: Gynecology;  Laterality: N/A;  . RADIOACTIVE SEED GUIDED PARTIAL MASTECTOMY WITH AXILLARY SENTINEL LYMPH NODE BIOPSY Left 04/17/2016   Procedure: RADIOACTIVE SEED GUIDED PARTIAL MASTECTOMY WITH AXILLARY SENTINEL LYMPH NODE BIOPSY;  Surgeon: Erroll Luna, MD;  Location: [facility];  Service: General;  Laterality: Left;  RADIOACTIVE SEED GUIDED PARTIAL MASTECTOMY WITH AXILLARY SENTINEL LYMPH NODE BIOPSY   . TUBAL LIGATION    . VAGINAL DELIVERY     x3   Social History:   reports that she quit smoking about 15 years ago. Her smoking use included cigarettes. She has a 12.50 pack-year smoking history. She has never used smokeless tobacco. She reports current alcohol use. She reports that she does not use drugs.  Family History  Problem Relation Age of Onset  . Stroke Sister   . Hypertension Mother   . Stroke Mother   . Alcohol abuse Brother   . HIV/AIDS Brother     Medications: Patient's Medications  New Prescriptions   No medications on file  Previous Medications   ANASTROZOLE (ARIMIDEX) 1 MG TABLET    Take 1 tablet (1 mg total) by mouth daily.   ASPIRIN EC 81  MG TABLET    Take 1 tablet (81 mg total) by mouth daily.   ATORVASTATIN (LIPITOR) 20 MG TABLET    Take 1 tablet (20 mg total) by mouth daily.   ESZOPICLONE (LUNESTA) 2 MG TABS TABLET    Take 1 tablet (2 mg total) by mouth at bedtime as needed for sleep. Take immediately before bedtime   LABETALOL (NORMODYNE) 300 MG TABLET    Take 2 tablets (600 mg total) by mouth 3 (three) times daily.   NIFEDIPINE (PROCARDIA XL/NIFEDICAL-XL) 90 MG 24 HR TABLET    Take 1 tablet (90 mg total) by mouth daily.  Modified Medications   No medications on file  Discontinued Medications   ACETAMINOPHEN (TYLENOL) 500 MG TABLET    Take 1  tablet (500 mg total) by mouth in the morning and at bedtime.    Physical Exam:  Vitals:   10/28/20 1436  BP: 136/78  Pulse: 76  Temp: (!) 96.8 F (36 C)  TempSrc: Temporal  SpO2: 97%  Weight: 205 lb (93 kg)  Height: 5\' 3"  (1.6 m)   Body mass index is 36.31 kg/m. Wt Readings from Last 3 Encounters:  10/28/20 205 lb (93 kg)  09/26/20 202 lb 3.2 oz (91.7 kg)  09/19/20 200 lb (90.7 kg)    Physical Exam Constitutional:      General: She is not in acute distress.    Appearance: She is well-developed and well-nourished. She is not diaphoretic.  HENT:     Head: Normocephalic and atraumatic.     Mouth/Throat:     Mouth: Oropharynx is clear and moist.     Pharynx: No oropharyngeal exudate.  Eyes:     Conjunctiva/sclera: Conjunctivae normal.     Pupils: Pupils are equal, round, and reactive to light.  Cardiovascular:     Rate and Rhythm: Normal rate and regular rhythm.     Heart sounds: Normal heart sounds.  Pulmonary:     Effort: Pulmonary effort is normal.     Breath sounds: Normal breath sounds.  Abdominal:     General: Bowel sounds are normal.     Palpations: Abdomen is soft.  Musculoskeletal:        General: No edema.     Right shoulder: Normal. No bony tenderness. Normal range of motion. Normal strength.     Left shoulder: Normal. No bony tenderness. Normal range of motion. Normal strength.     Right hand: Normal.     Left hand: Tenderness and bony tenderness present. Normal range of motion.       Hands:     Cervical back: Normal range of motion and neck supple.  Skin:    General: Skin is warm and dry.  Neurological:     Mental Status: She is alert and oriented to person, place, and time.  Psychiatric:        Mood and Affect: Mood and affect normal.     Labs reviewed: Basic Metabolic Panel: Recent Labs    11/13/19 0957 05/13/20 0930 07/29/20 1655  NA 143 146 146*  K 4.0 4.3 4.0  CL 107 109 106  CO2 25 27 25   GLUCOSE 89 101* 107*  BUN 22 23 23    CREATININE 1.22* 1.43* 1.79*  CALCIUM 10.0 10.1 9.9   Liver Function Tests: Recent Labs    11/13/19 0957 05/13/20 0930  AST 23 19  ALT 21 20  BILITOT 0.5 0.5  PROT 7.1 7.0   No results for input(s): LIPASE, AMYLASE in the last  8760 hours. No results for input(s): AMMONIA in the last 8760 hours. CBC: Recent Labs    11/13/19 0957 05/13/20 0930  WBC 4.5 5.4  NEUTROABS 2,655 3,386  HGB 12.7 12.0  HCT 37.4 35.3  MCV 91.4 92.2  PLT 301 272   Lipid Panel: Recent Labs    05/13/20 0930  CHOL 137  HDL 70  LDLCALC 55  TRIG 50  CHOLHDL 2.0   TSH: No results for input(s): TSH in the last 8760 hours. A1C: Lab Results  Component Value Date   HGBA1C 5.8 (H) 05/13/2020     Assessment/Plan 1. Encounter for colorectal cancer screening - Ambulatory referral to Gastroenterology  2. Estrogen deficiency - DG Bone Density; Future  3. Pain of left thumb Suspect arthritis due to nature of her work. She had been in to see Dr Mariea Clonts last month due to pain but did not wish to do PT. That pain has improved.  Will have her ice, tylenol 500 mg 2 tablets every 8 hours as needed and avoid repeat motion.   4. Acute pain of both shoulders Mild. Can ice, tylenol 500 mg 2 tablets every 8 hours. Suspect arthritis.   Next appt: to follow up in 1 week if pain persist, will get orthopedic referral. Carlos American. [place], [facility] [phone]

## 2020-10-29 ENCOUNTER — Encounter: Payer: Self-pay | Admitting: Cardiovascular Disease

## 2020-10-29 ENCOUNTER — Ambulatory Visit (INDEPENDENT_AMBULATORY_CARE_PROVIDER_SITE_OTHER): Payer: Medicare HMO | Admitting: Cardiovascular Disease

## 2020-10-29 DIAGNOSIS — E782 Mixed hyperlipidemia: Secondary | ICD-10-CM | POA: Diagnosis not present

## 2020-10-29 DIAGNOSIS — I10 Essential (primary) hypertension: Secondary | ICD-10-CM | POA: Diagnosis not present

## 2020-10-29 NOTE — Assessment & Plan Note (Signed)
History of hyperlipidemia on statin therapy lipid profile performed 05/05/2020 revealing total cholesterol 137, LDL 55 and HDL of 70.

## 2020-10-29 NOTE — Patient Instructions (Signed)
Medication Instructions:  Your physician recommends that you continue on your current medications as directed. Please refer to the Current Medication list given to you today.  *If you need a refill on your cardiac medications before your next appointment, please call your pharmacy*   Follow-Up: At [facility], you and your health needs are our priority.  As part of our continuing mission to provide you with exceptional heart care, we have created designated Provider Care Teams.  These Care Teams include your primary Cardiologist (physician) and Advanced Practice Providers (APPs -  Physician Assistants and Nurse Practitioners) who all work together to provide you with the care you need, when you need it.  We recommend signing up for the patient portal called "MyChart".  Sign up information is provided on this After Visit Summary.  MyChart is used to connect with patients for Virtual Visits (Telemedicine).  Patients are able to view lab/test results, encounter notes, upcoming appointments, etc.  Non-urgent messages can be sent to your provider as well.   To learn more about what you can do with MyChart, go to [email or web address].    Your next appointment:   6 month(s)  The format for your next appointment:   In Person  Provider:   You will see one of the following Advanced Practice Providers on your designated Care Team:    Luke Kilroy, PA-C  Callie Goodrich, PA-C  Jesse Cleaver, FNP  Then, Jonathan Berry, MD will plan to see you again in 12 month(s). 

## 2020-10-29 NOTE — Progress Notes (Signed)
10/29/2020 MEYER DOCKERY   1956-05-11  [id number]  Primary Physician [name], NP Primary Cardiologist: Lorretta Harp MD Renae Gloss  HPI:  Michele Page is a 64 y.o.  moderately overweight married African-American female mother of 3 children, grandmother of 64 grandchildrenwhorelocated from [place]. She is retired from working in her own daycare.I last saw her in the office 07/29/2020.  Is a history of hypertension and hypokalemia. She's had a stroke 4 years ago leaving her with some motor and cognitive deficit deficits. She's never had a heart attack. She denies chest pain or shortness of breath. She wasreferred here for further management of her hypertension.Since I saw her a year ago her blood pressure has remained well controlled on her current medical regimen.  Since I saw her in the office  3 months ago I did get renal Doppler studies 08/17/2020 that showed no evidence of renal artery stenosis.  I referred her to our Pharm.D. who has been working with her antihypertensive medications.  She has been keeping a blood pressure log which for the most part has shown her in an acceptable range.    Current Meds  Medication Sig   anastrozole (ARIMIDEX) 1 MG tablet Take 1 tablet (1 mg total) by mouth daily.   aspirin EC 81 MG tablet Take 1 tablet (81 mg total) by mouth daily.   atorvastatin (LIPITOR) 20 MG tablet Take 1 tablet (20 mg total) by mouth daily.   eszopiclone (LUNESTA) 2 MG TABS tablet Take 1 tablet (2 mg total) by mouth at bedtime as needed for sleep. Take immediately before bedtime   labetalol (NORMODYNE) 300 MG tablet Take 2 tablets (600 mg total) by mouth 3 (three) times daily.   NIFEdipine (PROCARDIA XL/NIFEDICAL-XL) 90 MG 24 hr tablet Take 1 tablet (90 mg total) by mouth daily.     No Known Allergies  Social History   Socioeconomic History   Marital status: Married    Spouse name: Not on file    Number of children: 3   Years of education: Not on file   Highest education level: Not on file  Occupational History   Occupation: retired  Tobacco Use   Smoking status: Former Smoker    Packs/day: 0.50    Years: 25.00    Pack years: 12.50    Types: Cigarettes    Quit date: 11/16/2004    Years since quitting: 15.9   Smokeless tobacco: Never Used  Scientific laboratory technician Use: Never used  Substance and Sexual Activity   Alcohol use: Yes    Alcohol/week: 0.0 standard drinks    Comment: occas   Drug use: No   Sexual activity: Yes    Comment: 1st intercourse 51 yo-5 partners  Other Topics Concern   Not on file  Social History Narrative   Diet- N/A   Caffeine- Yes   Married- Yes   House- 2 story with 2 people   Pets- No   Current/past profession- Nurse, mental health   Exercise- Yes   Living will-No   DNR-N/A   POA/HPOA-No      Social Determinants of Health   Financial Resource Strain: Not on file  Food Insecurity: Not on file  Transportation Needs: Not on file  Physical Activity: Not on file  Stress: Not on file  Social Connections: Not on file  Intimate Partner Violence: Not on file     Review of Systems: General: negative  for chills, fever, night sweats or weight changes.  Cardiovascular: negative for chest pain, dyspnea on exertion, edema, orthopnea, palpitations, paroxysmal nocturnal dyspnea or shortness of breath Dermatological: negative for rash Respiratory: negative for cough or wheezing Urologic: negative for hematuria Abdominal: negative for nausea, vomiting, diarrhea, bright red blood per rectum, melena, or hematemesis Neurologic: negative for visual changes, syncope, or dizziness All other systems reviewed and are otherwise negative except as noted above.    Blood pressure 132/72, pulse 78, height 5\' 3"  (1.6 m), weight 205 lb (93 kg), SpO2 99 %.  General appearance: alert and no distress Neck: no adenopathy, no carotid bruit, no JVD, supple,  symmetrical, trachea midline and thyroid not enlarged, symmetric, no tenderness/mass/nodules Lungs: clear to auscultation bilaterally Heart: regular rate and rhythm, S1, S2 normal, no murmur, click, rub or gallop Extremities: extremities normal, atraumatic, no cyanosis or edema Pulses: 2+ and symmetric Skin: Skin color, texture, turgor normal. No rashes or lesions Neurologic: Alert and oriented X 3, normal strength and tone. Normal symmetric reflexes. Normal coordination and gait  EKG not performed today  ASSESSMENT AND PLAN:   Accelerated hypertension History of accelerated hypertension blood pressure measured today 132/72.  She is on nifedipine and labetalol with negative renal Doppler studies.  She did see York Pellant.D., in our hypertension clinic who has been working with her as well.  Her blood pressure log the most part shows white blood pressure under good control except for occasional spikes.  Hyperlipidemia History of hyperlipidemia on statin therapy lipid profile performed 05/05/2020 revealing total cholesterol 137, LDL 55 and HDL of 70.      Lorretta Harp MD FACP,FACC,FAHA, [facility] 10/29/2020 3:52 PM

## 2020-10-29 NOTE — Assessment & Plan Note (Signed)
History of accelerated hypertension blood pressure measured today 132/72.  She is on nifedipine and labetalol with negative renal Doppler studies.  She did see York Pellant.D., in our hypertension clinic who has been working with her as well.  Her blood pressure log the most part shows white blood pressure under good control except for occasional spikes.

## 2020-11-04 NOTE — Assessment & Plan Note (Signed)
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, 1.3 cm, with DCIS, margins negative, 0/4 lymph nodes negative, T1 cN0 stage IA pathologic stage, ER 100%, PR 100%, HER-2 negative ratio 1.39, Ki-67 20% (Originally 2 nodules were detected on screening mammogram 1.9 cm= fat necrosis and 8 mm IDC) Oncotype DX score 20, 13% risk of recurrence, intermediate risk Adjuvant radiation therapy started 07/20/2017completed 07/13/2016  Current treatment: Adjuvant antiestrogen therapy withanastrozole started 07/23/2016 but switched toletrozolein June 2018 due to hair loss, switched to tamoxifen 10/22/2017 due to continuing hair loss with letrozole, switched back to anastrozole  Anastrozoletoxicities: Denies any hot flashes or myalgias.  Hair loss: Patient has having extraordinary amount of emotional suffering from the hair loss that she is experiencing She attributes the hair loss to pravastatin.  Surveillance: 1. Breast exam 11/05/2020: Benign 2. 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  Right cerebellar chronic cerebral microhemorrhage no acute findings no change from MRI 02/27/2017  CT abdomen 04/20/2019: Diffuse colonic diverticulosis  Return to clinic in 1 year for follow-up

## 2020-11-04 NOTE — Progress Notes (Signed)
Patient Care Team: [name], NP as PCP - General (Geriatric Medicine) Nicholas Lose, MD as Consulting Physician (Oncology) Erroll Luna, MD as Consulting Physician (General Surgery) Thea Silversmith, MD as Consulting Physician (Radiation Oncology)  DIAGNOSIS:    ICD-10-CM   1. Malignant neoplasm of upper-outer quadrant of left breast in female, estrogen receptor positive ([place])  C50.412    Z17.0     SUMMARY OF ONCOLOGIC HISTORY: Oncology History  Breast cancer of upper-outer quadrant of left female breast ([place])  03/31/2016 Initial Diagnosis   Screening detected left breast mass, 2 nodules, 1.9 x 1.6 x 0.8 cm= fat necrosis; 8 x 7 x 7 mm= grade 2 IDC ER 100%, PR 100%, HER-2 negative ratio 1.39, Ki-67 20%   04/17/2016 Surgery   Left lumpectomy ([name]): IDC grade 2, 1.3 cm, with DCIS, margins negative, 0/4 lymph nodes negative, T1 cN0 stage IA pathologic stage, ER 100%, PR 100%, HER-2 negative ratio 1.39, Ki-67 20% Oncotype DX score 20, 13% ROR, intermediate risk   04/17/2016 Oncotype testing   Recurrence score: 20; ROR 15% (intermediate risk)    05/27/2016 - 07/13/2016 Radiation Therapy   Adjuvant radiation therapy [facility]): Left breast treated with breath hold to 50.4 Gy in 28 fractions at 1.8 Gy/fraction.  Left breast boosted to 10 Gy in 5 fractions at 2 Gy/fraction   07/23/2016 -  Anti-estrogen oral therapy   Anastrozole 1 mg switched to letrozole 04/22/2017 due to hair loss, switch to tamoxifen 10/22/2017 due to hair loss from letrozole as well     CHIEF COMPLIANT: Follow-up of left breast cancer on anastrozole  INTERVAL HISTORY: Michele Page is a 64 y.o. with above-mentioned history of left breast cancer treated with lumpectomy, radiation, and who is currently on anastrozole. Mammogram on 12/06/18 showed no evidence of malignancy bilaterally. She presents to the clinic today for annual follow-up.  She is tolerating the treatment extremely well without any problems or  concerns.  Continues to have soreness in the breast.  ALLERGIES:  has No Known Allergies.  MEDICATIONS:  Current Outpatient Medications  Medication Sig Dispense Refill  . anastrozole (ARIMIDEX) 1 MG tablet Take 1 tablet (1 mg total) by mouth daily. 90 tablet 3  . aspirin EC 81 MG tablet Take 1 tablet (81 mg total) by mouth daily. 30 tablet 6  . atorvastatin (LIPITOR) 20 MG tablet Take 1 tablet (20 mg total) by mouth daily. 90 tablet 1  . eszopiclone (LUNESTA) 2 MG TABS tablet Take 1 tablet (2 mg total) by mouth at bedtime as needed for sleep. Take immediately before bedtime 10 tablet 0  . labetalol (NORMODYNE) 300 MG tablet Take 2 tablets (600 mg total) by mouth 3 (three) times daily. 540 tablet 1  . NIFEdipine (PROCARDIA XL/NIFEDICAL-XL) 90 MG 24 hr tablet Take 1 tablet (90 mg total) by mouth daily. 30 tablet 6   No current facility-administered medications for this visit.    PHYSICAL EXAMINATION: ECOG PERFORMANCE STATUS: 1 - Symptomatic but completely ambulatory  Vitals:   11/05/20 0841  BP: 133/67  Pulse: 75  Resp: 18  Temp: 97.9 F (36.6 C)  SpO2: 100%   Filed Weights   11/05/20 0841  Weight: 204 lb 1.6 oz (92.6 kg)     LABORATORY DATA:  I have reviewed the data as listed CMP Latest Ref Rng & Units 07/29/2020 05/13/2020 11/13/2019  Glucose 65 - 99 mg/dL 107(H) 101(H) 89  BUN 8 - 27 mg/dL 23 23 22   Creatinine 0.57 - 1.00 mg/dL  1.79(H) 1.43(H) 1.22(H)  Sodium 134 - 144 mmol/L 146(H) 146 143  Potassium 3.5 - 5.2 mmol/L 4.0 4.3 4.0  Chloride 96 - 106 mmol/L 106 109 107  CO2 20 - 29 mmol/L 25 27 25   Calcium 8.7 - 10.3 mg/dL 9.9 10.1 10.0  Total Protein 6.1 - 8.1 g/dL - 7.0 7.1  Total Bilirubin 0.2 - 1.2 mg/dL - 0.5 0.5  Alkaline Phos 33 - 130 U/L - - -  AST 10 - 35 U/L - 19 23  ALT 6 - 29 U/L - 20 21    Lab Results  Component Value Date   WBC 5.4 05/13/2020   HGB 12.0 05/13/2020   HCT 35.3 05/13/2020   MCV 92.2 05/13/2020   PLT 272 05/13/2020   NEUTROABS 3,386  05/13/2020    ASSESSMENT & PLAN:  Breast cancer of upper-outer quadrant of left female breast ([place]) 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, 1.3 cm, with DCIS, margins negative, 0/4 lymph nodes negative, T1 cN0 stage IA pathologic stage, ER 100%, PR 100%, HER-2 negative ratio 1.39, Ki-67 20% (Originally 2 nodules were detected on screening mammogram 1.9 cm= fat necrosis and 8 mm IDC) Oncotype DX score 20, 13% risk of recurrence, intermediate risk Adjuvant radiation therapy started 07/20/2017completed 07/13/2016  Current treatment: Adjuvant antiestrogen therapy withanastrozole started 07/23/2016 but switched toletrozolein June 2018 due to hair loss, switched to tamoxifen 10/22/2017 due to continuing hair loss with letrozole, switched back to anastrozole  Anastrozoletoxicities: Denies any hot flashes or myalgias. Duration of treatment: I discussed with her that it would be ideal to continue the antiestrogen therapy for a total of 7 years. She will make a decision on this next year when we meet again.  Hair loss: Continues to be a problem which she attributes to anastrozole.  Surveillance: 1. Breast exam 11/05/2020: Benign 2. Mammogram: 12/09/20: Benign breast density category B    CT abdomen 04/20/2019: Diffuse colonic diverticulosis  Return to clinic in 1 year for follow-up     No orders of the defined types were placed in this encounter.  The patient has a good understanding of the overall plan. she agrees with it. she will call with any problems that may develop before the next visit here.  Total time spent: 20 mins including face to face time and time spent for planning, charting and coordination of care  Nicholas Lose, MD 11/05/2020  I, Cloyde Reams Dorshimer, am acting as scribe for Dr. Nicholas Lose.  I have reviewed the above documentation for accuracy and completeness, and I agree with the above.

## 2020-11-05 ENCOUNTER — Inpatient Hospital Stay: Payer: Medicare HMO | Attending: Hematology and Oncology | Admitting: Hematology and Oncology

## 2020-11-05 ENCOUNTER — Other Ambulatory Visit: Payer: Self-pay

## 2020-11-05 ENCOUNTER — Telehealth: Payer: Self-pay | Admitting: Hematology and Oncology

## 2020-11-05 VITALS — BP 133/67 | HR 75 | Temp 97.9°F | Resp 18 | Ht 63.0 in | Wt 204.1 lb

## 2020-11-05 DIAGNOSIS — T451X5A Adverse effect of antineoplastic and immunosuppressive drugs, initial encounter: Secondary | ICD-10-CM | POA: Diagnosis not present

## 2020-11-05 DIAGNOSIS — C50412 Malignant neoplasm of upper-outer quadrant of left female breast: Secondary | ICD-10-CM

## 2020-11-05 DIAGNOSIS — Z923 Personal history of irradiation: Secondary | ICD-10-CM | POA: Diagnosis not present

## 2020-11-05 DIAGNOSIS — L659 Nonscarring hair loss, unspecified: Secondary | ICD-10-CM | POA: Diagnosis not present

## 2020-11-05 DIAGNOSIS — Z853 Personal history of malignant neoplasm of breast: Secondary | ICD-10-CM

## 2020-11-05 DIAGNOSIS — Z170 Estrogen receptor positive status [ER+]: Secondary | ICD-10-CM

## 2020-11-05 DIAGNOSIS — Z79811 Long term (current) use of aromatase inhibitors: Secondary | ICD-10-CM | POA: Insufficient documentation

## 2020-11-05 MED ORDER — ANASTROZOLE 1 MG PO TABS: 1.0000 mg | ORAL_TABLET | Freq: Every day | ORAL | 3 refills | Status: DC

## 2020-11-05 NOTE — Telephone Encounter (Signed)
Scheduled appts per 12/21 LOS. Gave pt a print out of AVS.  °

## 2020-11-18 ENCOUNTER — Ambulatory Visit (INDEPENDENT_AMBULATORY_CARE_PROVIDER_SITE_OTHER): Payer: Medicare HMO | Admitting: Nurse Practitioner

## 2020-11-18 ENCOUNTER — Other Ambulatory Visit: Payer: Self-pay

## 2020-11-18 ENCOUNTER — Encounter: Payer: Self-pay | Admitting: Nurse Practitioner

## 2020-11-18 VITALS — BP 152/70 | HR 66 | Temp 96.9°F | Ht 63.0 in | Wt 203.0 lb

## 2020-11-18 DIAGNOSIS — I10 Essential (primary) hypertension: Secondary | ICD-10-CM | POA: Diagnosis not present

## 2020-11-18 DIAGNOSIS — M79645 Pain in left finger(s): Secondary | ICD-10-CM

## 2020-11-18 DIAGNOSIS — C50412 Malignant neoplasm of upper-outer quadrant of left female breast: Secondary | ICD-10-CM

## 2020-11-18 DIAGNOSIS — G35 Multiple sclerosis: Secondary | ICD-10-CM

## 2020-11-18 DIAGNOSIS — I69359 Hemiplegia and hemiparesis following cerebral infarction affecting unspecified side: Secondary | ICD-10-CM

## 2020-11-18 DIAGNOSIS — F5101 Primary insomnia: Secondary | ICD-10-CM

## 2020-11-18 DIAGNOSIS — Z170 Estrogen receptor positive status [ER+]: Secondary | ICD-10-CM | POA: Diagnosis not present

## 2020-11-18 MED ORDER — HYDRALAZINE HCL 25 MG PO TABS: 25.0000 mg | ORAL_TABLET | Freq: Three times a day (TID) | ORAL | 1 refills | Status: DC

## 2020-11-18 MED ORDER — PREDNISONE 10 MG (21) PO TBPK: ORAL_TABLET | ORAL | 0 refills | Status: DC

## 2020-11-18 MED ORDER — ESZOPICLONE 2 MG PO TABS: 2.0000 mg | ORAL_TABLET | Freq: Every evening | ORAL | 0 refills | Status: DC | PRN

## 2020-11-18 NOTE — Progress Notes (Signed)
Careteam: Patient Care Team: Sharon Seller, NP as PCP - General (Geriatric Medicine) Serena Croissant, MD as Consulting Physician (Oncology) Harriette Bouillon, MD as Consulting Physician (General Surgery) Lurline Hare, MD as Consulting Physician (Radiation Oncology)  PLACE OF SERVICE:  [facility]  Advanced Directive information    No Known Allergies  Chief Complaint  Patient presents with  . Acute Visit    Ongoing left wrist pain x 3 weeks      HPI: Patient is a 65 y.o. female due to ongoing thumb pain in left hand.  Reports aches a lot and hurts Pain 8/10. She can do things but it is painful.  The rest of her hand pain has resolved has been going on for over 6 weeks.  Effects her sleep when really pain.  Tylenol does not help Iced area.   Uric acid level elevated in 2020 at 7.9 when she was having similar pain.   htn- continues to be elevated, reports it is not working and she needs to follow up with cardiology Blood pressure at home not at goal. 140-150/75  Insomnia- not sleeping well. Out of lunesta   Review of Systems:  Review of Systems  Constitutional: Negative for chills, fever and weight loss.  HENT: Negative for tinnitus.   Respiratory: Negative for cough, sputum production and shortness of breath.   Cardiovascular: Negative for chest pain, palpitations and leg swelling.  Gastrointestinal: Negative for abdominal pain, constipation, diarrhea and heartburn.  Genitourinary: Negative for dysuria, frequency and urgency.  Musculoskeletal: Positive for joint pain and myalgias. Negative for back pain and falls.  Skin: Negative.   Neurological: Negative for dizziness and headaches.  Psychiatric/Behavioral: Negative for depression and memory loss. The patient does not have insomnia.     Past Medical History:  Diagnosis Date  . Arthritis    lt knee  . Breast cancer ([name])   . Breast cancer of 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) 04/02/2016  .  Colon polyps   . COPD (chronic obstructive pulmonary disease) ([name])   . Hyperlipidemia   . Hypertension   . Multiple sclerosis ([name])   . Personal history of chemotherapy   . Stroke ([name])    2013, mild cognitive deficits  . Vision abnormalities    Past Surgical History:  Procedure Laterality Date  . BREAST BIOPSY Left 03/31/2016  . BREAST LUMPECTOMY Left 04/17/2016  . HYSTEROSCOPY WITH D & C N/A 12/19/2015   Procedure: DILATATION AND CURETTAGE /HYSTEROSCOPY;  Surgeon: Mitchel Honour, DO;  Location: [facility];  Service: Gynecology;  Laterality: N/A;  . RADIOACTIVE SEED GUIDED PARTIAL MASTECTOMY WITH AXILLARY SENTINEL LYMPH NODE BIOPSY Left 04/17/2016   Procedure: RADIOACTIVE SEED GUIDED PARTIAL MASTECTOMY WITH AXILLARY SENTINEL LYMPH NODE BIOPSY;  Surgeon: Harriette Bouillon, MD;  Location: [facility];  Service: General;  Laterality: Left;  RADIOACTIVE SEED GUIDED PARTIAL MASTECTOMY WITH AXILLARY SENTINEL LYMPH NODE BIOPSY   . TUBAL LIGATION    . VAGINAL DELIVERY     x3   Social History:   reports that she quit smoking about 16 years ago. Her smoking use included cigarettes. She has a 12.50 pack-year smoking history. She has never used smokeless tobacco. She reports current alcohol use. She reports that she does not use drugs.  Family History  Problem Relation Age of Onset  . Stroke Sister   . Hypertension Mother   . Stroke Mother   . Alcohol abuse Brother   . HIV/AIDS Brother     Medications:  Patient's Medications  New Prescriptions   No medications on file  Previous Medications   ANASTROZOLE (ARIMIDEX) 1 MG TABLET    Take 1 tablet (1 mg total) by mouth daily.   ASPIRIN EC 81 MG TABLET    Take 1 tablet (81 mg total) by mouth daily.   ATORVASTATIN (LIPITOR) 20 MG TABLET    Take 1 tablet (20 mg total) by mouth daily.   ESZOPICLONE (LUNESTA) 2 MG TABS TABLET    Take 1 tablet (2 mg total) by mouth at bedtime as needed for sleep. Take immediately before bedtime   LABETALOL  (NORMODYNE) 300 MG TABLET    Take 2 tablets (600 mg total) by mouth 3 (three) times daily.   NIFEDIPINE (PROCARDIA XL/NIFEDICAL-XL) 90 MG 24 HR TABLET    Take 1 tablet (90 mg total) by mouth daily.  Modified Medications   No medications on file  Discontinued Medications   No medications on file    Physical Exam:  Vitals:   11/18/20 1427  BP: (!) 152/70  Pulse: 66  Temp: (!) 96.9 F (36.1 C)  TempSrc: Temporal  SpO2: 97%  Weight: 203 lb (92.1 kg)  Height: 5\' 3"  (1.6 m)   Body mass index is 35.96 kg/m. Wt Readings from Last 3 Encounters:  11/18/20 203 lb (92.1 kg)  11/05/20 204 lb 1.6 oz (92.6 kg)  10/29/20 205 lb (93 kg)    Physical Exam Constitutional:      General: She is not in acute distress.    Appearance: She is well-developed and well-nourished. She is not diaphoretic.  HENT:     Head: Normocephalic and atraumatic.     Mouth/Throat:     Mouth: Oropharynx is clear and moist.     Pharynx: No oropharyngeal exudate.  Eyes:     Conjunctiva/sclera: Conjunctivae normal.     Pupils: Pupils are equal, round, and reactive to light.  Cardiovascular:     Rate and Rhythm: Normal rate and regular rhythm.     Heart sounds: Normal heart sounds.  Pulmonary:     Effort: Pulmonary effort is normal.     Breath sounds: Normal breath sounds.  Abdominal:     General: Bowel sounds are normal.     Palpations: Abdomen is soft.  Musculoskeletal:        General: No tenderness or edema.       Hands:     Cervical back: Normal range of motion and neck supple.  Skin:    General: Skin is warm and dry.  Neurological:     Mental Status: She is alert and oriented to person, place, and time.  Psychiatric:        Mood and Affect: Mood and affect and mood normal.        Behavior: Behavior normal.     Labs reviewed: Basic Metabolic Panel: Recent Labs    05/13/20 0930 07/29/20 1655  NA 146 146*  K 4.3 4.0  CL 109 106  CO2 27 25  GLUCOSE 101* 107*  BUN 23 23  CREATININE 1.43*  1.79*  CALCIUM 10.1 9.9   Liver Function Tests: Recent Labs    05/13/20 0930  AST 19  ALT 20  BILITOT 0.5  PROT 7.0   No results for input(s): LIPASE, AMYLASE in the last 8760 hours. No results for input(s): AMMONIA in the last 8760 hours. CBC: Recent Labs    05/13/20 0930  WBC 5.4  NEUTROABS 3,386  HGB 12.0  HCT 35.3  MCV 92.2  PLT 272   Lipid Panel: Recent Labs    05/13/20 0930  CHOL 137  HDL 70  LDLCALC 55  TRIG 50  CHOLHDL 2.0   TSH: No results for input(s): TSH in the last 8760 hours. A1C: Lab Results  Component Value Date   HGBA1C 5.8 (H) 05/13/2020     Assessment/Plan 1. Pain of left thumb -suspect GOUT flare, elevated uric acid level in January and May 2020 - predniSONE (STERAPRED UNI-PAK 21 TAB) 10 MG (21) TBPK tablet; Use as directed  Dispense: 21 tablet; Refill: 0 - Uric Acid- if elevated will need to start allopurinol  - CBC with Differential/Platelet  2. Essential hypertension -continues to be elevated. Continues on nifedipine and labetalol  -to start hydralazine 25 mg by mouth TID.  - hydrALAZINE (APRESOLINE) 25 MG tablet; Take 1 tablet (25 mg total) by mouth 3 (three) times daily.  Dispense: 90 tablet; Refill: 1 - Amb ref to Medical Nutrition Therapy-[facility] - COMPLETE METABOLIC PANEL WITH GFR  3. Hemiplegia following CVA (cerebrovascular accident) ([place]) Stable, continues on ASA 81 mg   4. Multiple sclerosis ([place]) Without worsening symptoms. Had been following by neuro yearly, last MRI stable in 2020.   5. Malignant neoplasm of upper-outer quadrant of left breast in female, estrogen receptor positive ([place]) Continues to follow up with oncologist. Continues on Arimidex 1 mg daily   6. Morbid obesity ([place]) With hx of CVA, hyperlipidemia, hypertension. Discussed healthy weight loss with diet and increase physical activity.  - Amb ref to Medical Nutrition Therapy-[facility]  7. Primary insomnia - eszopiclone (LUNESTA) 2 MG TABS tablet; Take 1  tablet (2 mg total) by mouth at bedtime as needed for sleep. Take immediately before bedtime  Dispense: 15 tablet; Refill: 0  Next appt: 12/10/2020 Carlos American. [place], [facility] [phone]

## 2020-11-18 NOTE — Patient Instructions (Signed)
ADD hydralazine 25 mg by mouth three times daily for blood pressure Continue to take blood pressure at home Goal <140/90  Prednisone taper for thumb/wrist Check labs.    Low-Purine Eating Plan A low-purine eating plan involves making food choices to limit your intake of purine. Purine is a kind of uric acid. Too much uric acid in your blood can cause certain conditions, such as gout and kidney stones. Eating a low-purine diet can help control these conditions. What are tips for following this plan? Reading food labels   Avoid foods with saturated or Trans fat.  Check the ingredient list of grains-based foods, such as bread and cereal, to make sure that they contain whole grains.  Check the ingredient list of sauces or soups to make sure they do not contain meat or fish.  When choosing soft drinks, check the ingredient list to make sure they do not contain high-fructose corn syrup. Shopping  Buy plenty of fresh fruits and vegetables.  Avoid buying canned or fresh fish.  Buy dairy products labeled as low-fat or nonfat.  Avoid buying premade or processed foods. These foods are often high in fat, salt (sodium), and added sugar. Cooking  Use olive oil instead of butter when cooking. Oils like olive oil, canola oil, and sunflower oil contain healthy fats. Meal planning  Learn which foods do or do not affect you. If you find out that a food tends to cause your gout symptoms to flare up, avoid eating that food. You can enjoy foods that do not cause problems. If you have any questions about a food item, talk with your dietitian or health care provider.  Limit foods high in fat, especially saturated fat. Fat makes it harder for your body to get rid of uric acid.  Choose foods that are lower in fat and are lean sources of protein. General guidelines  Limit alcohol intake to no more than 1 drink a day for nonpregnant women and 2 drinks a day for men. One drink equals 12 oz of beer, 5 oz  of wine, or 1 oz of hard liquor. Alcohol can affect the way your body gets rid of uric acid.  Drink plenty of water to keep your urine clear or pale yellow. Fluids can help remove uric acid from your body.  If directed by your health care provider, take a vitamin C supplement.  Work with your health care provider and dietitian to develop a plan to achieve or maintain a healthy weight. Losing weight can help reduce uric acid in your blood. What foods are recommended? The items listed may not be a complete list. Talk with your dietitian about what dietary choices are best for you. Foods low in purines Foods low in purines do not need to be limited. These include:  All fruits.  All low-purine vegetables, pickles, and olives.  Breads, pasta, rice, cornbread, and popcorn. Cake and other baked goods.  All dairy foods.  Eggs, nuts, and nut butters.  Spices and condiments, such as salt, herbs, and vinegar.  Plant oils, butter, and margarine.  Water, sugar-free soft drinks, tea, coffee, and cocoa.  Vegetable-based soups, broths, sauces, and gravies. Foods moderate in purines Foods moderate in purines should be limited to the amounts listed.   cup of asparagus, cauliflower, spinach, mushrooms, or green peas, each day.  2/3 cup uncooked oatmeal, each day.   cup dry wheat bran or wheat germ, each day.  2-3 ounces of meat or poultry, each day.  4-6  ounces of shellfish, such as crab, lobster, oysters, or shrimp, each day.  1 cup cooked beans, peas, or lentils, each day.  Soup, broths, or bouillon made from meat or fish. Limit these foods as much as possible. What foods are not recommended? The items listed may not be a complete list. Talk with your dietitian about what dietary choices are best for you. Limit your intake of foods high in purines, including:  Beer and other alcohol.  Meat-based gravy or sauce.  Canned or fresh fish, such as: ? Anchovies, sardines, herring,  and tuna. ? Mussels and scallops. ? Codfish, trout, and haddock.  Tomasa Blase.  Organ meats, such as: ? Liver or kidney. ? Tripe. ? Sweetbreads (thymus gland or pancreas).  Wild Education officer, environmental.  Yeast or yeast extract supplements.  Drinks sweetened with high-fructose corn syrup. Summary  Eating a low-purine diet can help control conditions caused by too much uric acid in the body, such as gout or kidney stones.  Choose low-purine foods, limit alcohol, and limit foods high in fat.  You will learn over time which foods do or do not affect you. If you find out that a food tends to cause your gout symptoms to flare up, avoid eating that food. This information is not intended to replace advice given to you by your health care provider. Make sure you discuss any questions you have with your health care provider. Document Revised: 10/15/2017 Document Reviewed: 12/16/2016 Elsevier Patient Education  2020 [employer].

## 2020-11-19 ENCOUNTER — Other Ambulatory Visit: Payer: Self-pay

## 2020-11-19 LAB — CBC WITH DIFFERENTIAL/PLATELET
Absolute Monocytes: 421 cells/uL (ref 200–950)
Basophils Absolute: 38 cells/uL (ref 0–200)
Basophils Relative: 0.7 %
Eosinophils Absolute: 151 cells/uL (ref 15–500)
Eosinophils Relative: 2.8 %
HCT: 37 % (ref 35.0–45.0)
Hemoglobin: 12.9 g/dL (ref 11.7–15.5)
Lymphs Abs: 1615 cells/uL (ref 850–3900)
MCH: 31.9 pg (ref 27.0–33.0)
MCHC: 34.9 g/dL (ref 32.0–36.0)
MCV: 91.6 fL (ref 80.0–100.0)
MPV: 10.3 fL (ref 7.5–12.5)
Monocytes Relative: 7.8 %
Neutro Abs: 3175 cells/uL (ref 1500–7800)
Neutrophils Relative %: 58.8 %
Platelets: 305 10*3/uL (ref 140–400)
RBC: 4.04 10*6/uL (ref 3.80–5.10)
RDW: 12.7 % (ref 11.0–15.0)
Total Lymphocyte: 29.9 %
WBC: 5.4 10*3/uL (ref 3.8–10.8)

## 2020-11-19 LAB — COMPLETE METABOLIC PANEL WITH GFR
AG Ratio: 1.9 (calc) (ref 1.0–2.5)
ALT: 20 U/L (ref 6–29)
AST: 25 U/L (ref 10–35)
Albumin: 4.8 g/dL (ref 3.6–5.1)
Alkaline phosphatase (APISO): 96 U/L (ref 37–153)
BUN/Creatinine Ratio: 11 (calc) (ref 6–22)
BUN: 15 mg/dL (ref 7–25)
CO2: 24 mmol/L (ref 20–32)
Calcium: 9.9 mg/dL (ref 8.6–10.4)
Chloride: 106 mmol/L (ref 98–110)
Creat: 1.33 mg/dL — ABNORMAL HIGH (ref 0.50–0.99)
GFR, Est African American: 48 mL/min/{1.73_m2} — ABNORMAL LOW (ref >=60)
GFR, Est Non African American: 42 mL/min/{1.73_m2} — ABNORMAL LOW (ref >=60)
Globulin: 2.5 g/dL (calc) (ref 1.9–3.7)
Glucose, Bld: 91 mg/dL (ref 65–139)
Potassium: 4.2 mmol/L (ref 3.5–5.3)
Sodium: 142 mmol/L (ref 135–146)
Total Bilirubin: 0.6 mg/dL (ref 0.2–1.2)
Total Protein: 7.3 g/dL (ref 6.1–8.1)

## 2020-11-19 LAB — URIC ACID: Uric Acid, Serum: 7.4 mg/dL — ABNORMAL HIGH (ref 2.5–7.0)

## 2020-11-19 MED ORDER — ALLOPURINOL 100 MG PO TABS: 100.0000 mg | ORAL_TABLET | Freq: Every day | ORAL | 6 refills | Status: DC

## 2020-11-20 ENCOUNTER — Encounter: Payer: Self-pay | Admitting: Registered"

## 2020-11-20 ENCOUNTER — Other Ambulatory Visit: Payer: Self-pay

## 2020-11-20 ENCOUNTER — Encounter: Payer: Medicare HMO | Attending: Nurse Practitioner | Admitting: Registered"

## 2020-11-20 DIAGNOSIS — I129 Hypertensive chronic kidney disease with stage 1 through stage 4 chronic kidney disease, or unspecified chronic kidney disease: Secondary | ICD-10-CM | POA: Diagnosis not present

## 2020-11-20 DIAGNOSIS — N189 Chronic kidney disease, unspecified: Secondary | ICD-10-CM | POA: Insufficient documentation

## 2020-11-20 DIAGNOSIS — I10 Essential (primary) hypertension: Secondary | ICD-10-CM

## 2020-11-20 NOTE — Patient Instructions (Addendum)
-   Aim to increase fruit, vegetable, and fiber intake. Aim for whole grains when choosing cereal, crackers, and pastas. See handout.   - Aim for lower fat dairy products.   - Increase physical activity to 30 min, 2-3 times/week to help with overall health and wellbeing.

## 2020-11-20 NOTE — Progress Notes (Signed)
Medical Nutrition Therapy  Appointment Start time:  2:00  Appointment End time:  3:09  Primary concerns today: weight loss  Referral diagnosis: hypertension, obesity, chronic kidney disease Preferred learning style: no preference indicated Learning readiness: contemplation   NUTRITION ASSESSMENT   Pt states she is not taking hydralazine because she doesn't need it. States she is trying to find a pharmacy that will fill her prescription for nifedipine.   Reports the purpose of her appt is for her to lose weight. States blood pressure is a concern as well but knows it can be well controlled when she is back on her prescription for nifedipine. States she has has hypertension since she was 20-29 years.   States she doesn't eat much because her stomach gets full fast. States she goes to bed 10-11pm and sleeps about 5 hrs/night.  States she no longer drinks diet soda anymore  States she uses very small amount of salt when cooking. Also uses garlic and black pepper. States she knows she has to lose the weight.   Pt expectations: wants to know ways she can lose 50 lbs   Clinical Medical Hx: CKD, hypertension, multiple sclerosis, breast cancer Medications: See list Labs: none  Notable Signs/Symptoms: none reported  Lifestyle & Dietary Hx Estimated daily fluid intake: 56 oz Supplements: none reported Sleep: 5 hrs/night Stress / self-care: none reported Current average weekly physical activity: gym sometimes (treadmill, bike, strength training) 60 min, 2x/week. Plans to go more. Last visit at gym was 3 weeks ago.   24-Hr Dietary Recall First Meal (7:30 am): slice of cake or doughnut  Snack:  Second Meal: crackers, grapes, chicken salad Snack:  Third Meal (2-3 pm): spinach + potatoes + meat Snack: sometimes jello or ice cream bar Beverages: coffee (8 oz), water (2*24 oz; 48 oz); 56 oz   NUTRITION DIAGNOSIS  NB-1.1 Food and nutrition-related knowledge deficit As related to  hypertension.  As evidenced by incomplete knowledge.   NUTRITION INTERVENTION  Nutrition education (E-1) on the following topics: Nutrition education and counseling. Pt was educated and counseled on hypertension, nutritional ways to lower blood pressure, ways to increase fiber, vitamin, and mineral intake, and ways to increase physical activity. Discussed metabolism and how to balance meals. Pt was in agreement with goals listed.  Handouts Provided Include   Hypertension Nutrition Therapy  Learning Style & Readiness for Change Teaching method utilized: Visual & Auditory  Demonstrated degree of understanding via: Teach Back  Barriers to learning/adherence to lifestyle change: contemplative stage of change  Goals Established by Pt  Aim to increase fruit, vegetable, and fiber intake. Aim for whole grains when choosing cereal, crackers, and pastas. See handout.   Aim for lower fat dairy products.   Increase physical activity to 30 min, 2-3 times/week to help with overall health and wellbeing.   MONITORING & EVALUATION Dietary intake and weekly physical activity, prn.  Next Steps  Patient is to call for follow-up appt as needed.

## 2020-11-21 ENCOUNTER — Ambulatory Visit: Payer: Medicare HMO | Admitting: Registered"

## 2020-12-09 ENCOUNTER — Ambulatory Visit
Admission: RE | Admit: 2020-12-09 | Discharge: 2020-12-09 | Disposition: A | Discharge status: Home / Self Care | Payer: Medicare HMO | Source: Ambulatory Visit | Attending: Hematology and Oncology | Admitting: Hematology and Oncology

## 2020-12-09 ENCOUNTER — Other Ambulatory Visit: Payer: Self-pay

## 2020-12-09 DIAGNOSIS — Z853 Personal history of malignant neoplasm of breast: Secondary | ICD-10-CM | POA: Diagnosis not present

## 2020-12-09 DIAGNOSIS — R922 Inconclusive mammogram: Secondary | ICD-10-CM | POA: Diagnosis not present

## 2020-12-09 DIAGNOSIS — Z9889 Other specified postprocedural states: Secondary | ICD-10-CM

## 2020-12-09 HISTORY — DX: Personal history of irradiation: Z92.3

## 2020-12-10 ENCOUNTER — Encounter: Payer: Self-pay | Admitting: Nurse Practitioner

## 2020-12-10 ENCOUNTER — Ambulatory Visit (INDEPENDENT_AMBULATORY_CARE_PROVIDER_SITE_OTHER): Payer: Medicare HMO | Admitting: Nurse Practitioner

## 2020-12-10 DIAGNOSIS — Z0000 Encounter for general adult medical examination without abnormal findings: Secondary | ICD-10-CM | POA: Diagnosis not present

## 2020-12-10 NOTE — Progress Notes (Signed)
Subjective:   Michele Page is a 65 y.o. female who presents for Medicare Annual (Subsequent) preventive examination.  Review of Systems     Cardiac Risk Factors include: obesity (BMI >30kg/m2);advanced age (>64men, >67 women);family history of premature cardiovascular disease;hypertension;dyslipidemia     Objective:    There were no vitals filed for this visit. There is no height or weight on file to calculate BMI.  Advanced Directives 12/10/2020 11/20/2020 09/19/2020 05/13/2020 07/12/2019 04/11/2019 03/29/2019  Does Patient Have a Medical Advance Directive? No No No No No No No  Would patient like information on creating a medical advance directive? No - Patient declined No - Patient declined No - Patient declined - No - Patient declined No - Patient declined No - Patient declined    Current Medications (verified) Outpatient Encounter Medications as of 12/10/2020  Medication Sig  . allopurinol (ZYLOPRIM) 100 MG tablet Take 1 tablet (100 mg total) by mouth daily.  Marland Kitchen anastrozole (ARIMIDEX) 1 MG tablet Take 1 tablet (1 mg total) by mouth daily.  Marland Kitchen aspirin EC 81 MG tablet Take 1 tablet (81 mg total) by mouth daily.  Marland Kitchen atorvastatin (LIPITOR) 20 MG tablet Take 1 tablet (20 mg total) by mouth daily.  . eszopiclone (LUNESTA) 2 MG TABS tablet Take 1 tablet (2 mg total) by mouth at bedtime as needed for sleep. Take immediately before bedtime  . labetalol (NORMODYNE) 300 MG tablet Take 2 tablets (600 mg total) by mouth 3 (three) times daily.  Marland Kitchen NIFEdipine (PROCARDIA XL/NIFEDICAL-XL) 90 MG 24 hr tablet Take 1 tablet (90 mg total) by mouth daily.  . hydrALAZINE (APRESOLINE) 25 MG tablet Take 1 tablet (25 mg total) by mouth 3 (three) times daily. (Patient not taking: No sig reported)  . [DISCONTINUED] predniSONE (STERAPRED UNI-PAK 21 TAB) 10 MG (21) TBPK tablet Use as directed   No facility-administered encounter medications on file as of 12/10/2020.    Allergies (verified) Patient has no known  allergies.   History: Past Medical History:  Diagnosis Date  . Arthritis    lt knee  . Breast cancer ([place])   . Breast cancer of upper-outer quadrant of left female breast ([place]) 04/02/2016  . Colon polyps   . COPD (chronic obstructive pulmonary disease) ([place])   . Hyperlipidemia   . Hypertension   . Multiple sclerosis ([place])   . Personal history of chemotherapy   . Personal history of radiation therapy   . Stroke ([place])    2013, mild cognitive deficits  . Vision abnormalities    Past Surgical History:  Procedure Laterality Date  . BREAST BIOPSY Left 03/31/2016  . BREAST LUMPECTOMY Left 04/17/2016  . HYSTEROSCOPY WITH D & C N/A 12/19/2015   Procedure: DILATATION AND CURETTAGE /HYSTEROSCOPY;  Surgeon: Linda Hedges, DO;  Location: [facility];  Service: Gynecology;  Laterality: N/A;  . RADIOACTIVE SEED GUIDED PARTIAL MASTECTOMY WITH AXILLARY SENTINEL LYMPH NODE BIOPSY Left 04/17/2016   Procedure: RADIOACTIVE SEED GUIDED PARTIAL MASTECTOMY WITH AXILLARY SENTINEL LYMPH NODE BIOPSY;  Surgeon: Erroll Luna, MD;  Location: [facility];  Service: General;  Laterality: Left;  RADIOACTIVE SEED GUIDED PARTIAL MASTECTOMY WITH AXILLARY SENTINEL LYMPH NODE BIOPSY   . TUBAL LIGATION    . VAGINAL DELIVERY     x3   Family History  Problem Relation Age of Onset  . Stroke Sister   . Hypertension Mother   . Stroke Mother   . Alcohol abuse Brother   . HIV/AIDS Brother    Social History  Socioeconomic History  . Marital status: Married    Spouse name: Not on file  . Number of children: 3  . Years of education: Not on file  . Highest education level: Not on file  Occupational History  . Occupation: retired  Tobacco Use  . Smoking status: Former Smoker    Packs/day: 0.50    Years: 25.00    Pack years: 12.50    Types: Cigarettes    Quit date: 11/16/2004    Years since quitting: 16.0  . Smokeless tobacco: Never Used  Vaping Use  . Vaping Use: Never used  Substance and Sexual  Activity  . Alcohol use: Yes    Alcohol/week: 0.0 standard drinks    Comment: occas  . Drug use: No  . Sexual activity: Yes    Comment: 1st intercourse 50 yo-5 partners  Other Topics Concern  . Not on file  Social History Narrative   Diet- N/A   Caffeine- Yes   Married- Yes   House- 2 story with 2 people   Pets- No   Current/past profession- Nurse, mental health   Exercise- Yes   Living will-No   DNR-N/A   POA/HPOA-No      Works part-time at [employer] Strain: Not on [employer] Insecurity: Not on file  Transportation Needs: Not on file  Physical Activity: Not on file  Stress: Not on file  Social Connections: Not on file    Tobacco Counseling Counseling given: Not Answered   Clinical Intake:  Pre-visit preparation completed: Yes  Pain : No/denies pain     BMI - recorded: 35 Nutritional Status: BMI > 30  Obese Nutritional Risks: None Diabetes: No  How often do you need to have someone help you when you read instructions, pamphlets, or other written materials from your doctor or pharmacy?: 1 - Never  Diabetic?no         Activities of Daily Living In your present state of health, do you have any difficulty performing the following activities: 12/10/2020  Hearing? N  Vision? N  Difficulty concentrating or making decisions? Y  Walking or climbing stairs? N  Dressing or bathing? N  Doing errands, shopping? N  Preparing Food and eating ? N  Using the Toilet? N  In the past six months, have you accidently leaked urine? N  Do you have problems with loss of bowel control? N  Managing your Medications? N  Managing your Finances? N  Housekeeping or managing your Housekeeping? N  Some recent data might be hidden    Patient Care Team: [name], NP as PCP - General (Geriatric Medicine) Nicholas Lose, MD as Consulting Physician (Oncology) Erroll Luna, MD as Consulting Physician (General  Surgery) Thea Silversmith, MD as Consulting Physician (Radiation Oncology)  Indicate any recent Medical Services you may have received from other than [facility] providers in the past year (date may be approximate).     Assessment:   This is a routine wellness examination for Michele Page.  Hearing/Vision screen  Hearing Screening   125Hz  250Hz  500Hz  1000Hz  2000Hz  3000Hz  4000Hz  6000Hz  8000Hz   Right ear:           Left ear:           Comments: Patient has no hearing problems.  Vision Screening Comments: Patient wears glasses. Patient had eye exam in November.Patient can't remember doctor's name  Dietary issues and exercise activities discussed: Current Exercise Habits: Home exercise routine,  Type of exercise: treadmill, Time (Minutes): 60, Frequency (Times/Week): 3, Weekly Exercise (Minutes/Week): 180  Goals    . Increase water intake     Starting 12/11/16, I will attempt to increase my water intake from 2 glasses to 5 glasses per day.     . Weight (lb) < 150 lb (68 kg)     Would like to lose 50 lbs, by increasing exercise and modification to diet.       Depression Screen PHQ 2/9 Scores 12/10/2020 11/20/2020 09/19/2020 05/13/2020 12/08/2018 12/08/2018 07/29/2018  PHQ - 2 Score 0 0 0 0 0 0 0    Fall Risk Fall Risk  12/10/2020 11/20/2020 09/19/2020 05/13/2020 11/13/2019  Falls in the past year? 0 0 0 0 0  Number falls in past yr: 0 0 0 0 0  Injury with Fall? 0 - 0 0 0    FALL RISK PREVENTION PERTAINING TO THE HOME:  Any stairs in or around the home? Yes  If so, are there any without handrails? No  Home free of loose throw rugs in walkways, pet beds, electrical cords, etc? Yes  Adequate lighting in your home to reduce risk of falls? Yes   ASSISTIVE DEVICES UTILIZED TO PREVENT FALLS:  Life alert? No  Use of a cane, walker or w/c? No  Grab bars in the bathroom? No  Shower chair or bench in shower? No  Elevated toilet seat or a handicapped toilet? No   TIMED UP AND GO:  Was the test  performed? No .   Cognitive Function: MMSE - Mini Mental State Exam 12/11/2016  Orientation to time 5  Orientation to Place 4  Registration 3  Attention/ Calculation 5  Recall 2  Language- name 2 objects 2  Language- repeat 1  Language- follow 3 step command 2  Language- read & follow direction 1  Write a sentence 1  Copy design 0  Total score 26     6CIT Screen 12/10/2020  What Year? 0 points  What month? 0 points  What time? 0 points  Count back from 20 0 points  Months in reverse 0 points  Repeat phrase 2 points  Total Score 2    Immunizations Immunization History  Administered Date(s) Administered  . Influenza,inj,Quad PF,6+ Mos 08/07/2015, 08/06/2016, 11/24/2017, 10/26/2018, 07/12/2019  . PPD Test 07/13/2017    TDAP status: Due, Education has been provided regarding the importance of this vaccine. Advised may receive this vaccine at local pharmacy or Health Dept. Aware to provide a copy of the vaccination record if obtained from local pharmacy or Health Dept. Verbalized acceptance and understanding.  Flu Vaccine status: Declined, Education has been provided regarding the importance of this vaccine but patient still declined. Advised may receive this vaccine at local pharmacy or Health Dept. Aware to provide a copy of the vaccination record if obtained from local pharmacy or Health Dept. Verbalized acceptance and understanding.  Pneumococcal vaccine status: Declined,  Education has been provided regarding the importance of this vaccine but patient still declined. Advised may receive this vaccine at local pharmacy or Health Dept. Aware to provide a copy of the vaccination record if obtained from local pharmacy or Health Dept. Verbalized acceptance and understanding.   Covid-19 vaccine status: Declined, Education has been provided regarding the importance of this vaccine but patient still declined. Advised may receive this vaccine at local pharmacy or Health Dept.or vaccine  clinic. Aware to provide a copy of the vaccination record if obtained from local pharmacy or Health  Dept. Verbalized acceptance and understanding.  Qualifies for Shingles Vaccine? Yes   Zostavax completed No   Shingrix Completed?: No.    Education has been provided regarding the importance of this vaccine. Patient has been advised to call insurance company to determine out of pocket expense if they have not yet received this vaccine. Advised may also receive vaccine at local pharmacy or Health Dept. Verbalized acceptance and understanding.  Screening Tests Health Maintenance  Topic Date Due  . COVID-19 Vaccine (1) Never done  . TETANUS/TDAP  Never done  . COLONOSCOPY (Pts 45-39yrs Insurance coverage will need to be confirmed)  07/07/2018  . DEXA SCAN  Never done  . PNA vac Low Risk Adult (1 of 2 - PCV13) Never done  . INFLUENZA VACCINE  02/13/2021 (Originally 06/16/2020)  . PAP SMEAR-Modifier  05/02/2021  . MAMMOGRAM  12/09/2022  . Hepatitis C Screening  Completed  . HIV Screening  Completed    Health Maintenance  Health Maintenance Due  Topic Date Due  . COVID-19 Vaccine (1) Never done  . TETANUS/TDAP  Never done  . COLONOSCOPY (Pts 45-92yrs Insurance coverage will need to be confirmed)  07/07/2018  . DEXA SCAN  Never done  . PNA vac Low Risk Adult (1 of 2 - PCV13) Never done    Colorectal cancer screening: Referral to GI placed and scheduled. Pt aware the office will call re: appt.  Mammogram status: Completed 12/09/20. Repeat every year  Bone Density status: Ordered 10/28/2020. Pt provided with contact info and advised to call to schedule appt.  Lung Cancer Screening: (Low Dose CT Chest recommended if Age 36-80 years, 30 pack-year currently smoking OR have quit w/in 15years.) does not qualify.   Lung Cancer Screening Referral: na  Additional Screening:  Hepatitis C Screening: does qualify; Completed 2021   Vision Screening: Recommended annual ophthalmology exams for  early detection of glaucoma and other disorders of the eye. Is the patient up to date with their annual eye exam?  Yes  Who is the provider or what is the name of the office in which the patient attends annual eye exams?- does not remember If pt is not established with a provider, would they like to be referred to a provider to establish care? No .   Dental Screening: Recommended annual dental exams for proper oral hygiene  Community Resource Referral / Chronic Care Management: CRR required this visit?  No   CCM required this visit?  No      Plan:     I have personally reviewed and noted the following in the patient's chart:   . Medical and social history . Use of alcohol, tobacco or illicit drugs  . Current medications and supplements . Functional ability and status . Nutritional status . Physical activity . Advanced directives . List of other physicians . Hospitalizations, surgeries, and ER visits in previous 12 months . Vitals . Screenings to include cognitive, depression, and falls . Referrals and appointments  In addition, I have reviewed and discussed with patient certain preventive protocols, quality metrics, and best practice recommendations. A written personalized care plan for preventive services as well as general preventive health recommendations were provided to patient.     [name], NP   12/10/2020    Virtual Visit via Telephone Note  I connected with@ on 12/10/20 at  3:45 PM EST by telephone and verified that I am speaking with the correct person using two identifiers.  Location: Patient: home Provider: [facility]  I discussed the limitations, risks, security and privacy concerns of performing an evaluation and management service by telephone and the availability of in person appointments. I also discussed with the patient that there may be a patient responsible charge related to this service. The patient expressed understanding and agreed to  proceed.   I discussed the assessment and treatment plan with the patient. The patient was provided an opportunity to ask questions and all were answered. The patient agreed with the plan and demonstrated an understanding of the instructions.   The patient was advised to call back or seek an in-person evaluation if the symptoms worsen or if the condition fails to improve as anticipated.  I provided 18 minutes of non-face-to-face time during this encounter.  Carlos American. Harle Battiest Avs printed and mailed

## 2020-12-10 NOTE — Patient Instructions (Signed)
[name] , Thank you for taking time to come for your Medicare Wellness Visit. I appreciate your ongoing commitment to your health goals. Please review the following plan we discussed and let me know if I can assist you in the future.   Screening recommendations/referrals: Colonoscopy - DUE Mammogram -up to date Bone Density - RECOMMENDED- call [phone] Recommended yearly ophthalmology/optometry visit for glaucoma screening and checkup Recommended yearly dental visit for hygiene and checkup  Vaccinations: Influenza vaccine RECOMMENDED Pneumococcal vaccine RECOMMENDED- to get in office Tdap vaccine RECOMMENDED - to get at your local pharmacy Shingles vaccine -RECOMMENDED- to get at local pharmacy     Advanced directives: recommend to review MOST form at home and we will complete this together in office Recommended to complete living will and have notarized and then bring to the office to put on file.   Conditions/risks identified: cardiovascular risk, hypertension.   Next appointment: yearly    Preventive Care 66 Years and Older, Female Preventive care refers to lifestyle choices and visits with your health care provider that can promote health and wellness. What does preventive care include?  A yearly physical exam. This is also called an annual well check.  Dental exams once or twice a year.  Routine eye exams. Ask your health care provider how often you should have your eyes checked.  Personal lifestyle choices, including:  Daily care of your teeth and gums.  Regular physical activity.  Eating a healthy diet.  Avoiding tobacco and drug use.  Limiting alcohol use.  Practicing safe sex.  Taking low-dose aspirin every day.  Taking vitamin and mineral supplements as recommended by your health care provider. What happens during an annual well check? The services and screenings done by your health care provider during your annual well check will depend on your age,  overall health, lifestyle risk factors, and family history of disease. Counseling  Your health care provider may ask you questions about your:  Alcohol use.  Tobacco use.  Drug use.  Emotional well-being.  Home and relationship well-being.  Sexual activity.  Eating habits.  History of falls.  Memory and ability to understand (cognition).  Work and work Statistician.  Reproductive health. Screening  You may have the following tests or measurements:  Height, weight, and BMI.  Blood pressure.  Lipid and cholesterol levels. These may be checked every 5 years, or more frequently if you are over 53 years old.  Skin check.  Lung cancer screening. You may have this screening every year starting at age 66 if you have a 30-pack-year history of smoking and currently smoke or have quit within the past 15 years.  Fecal occult blood test (FOBT) of the stool. You may have this test every year starting at age 44.  Flexible sigmoidoscopy or colonoscopy. You may have a sigmoidoscopy every 5 years or a colonoscopy every 10 years starting at age 50.  Hepatitis C blood test.  Hepatitis B blood test.  Sexually transmitted disease (STD) testing.  Diabetes screening. This is done by checking your blood sugar (glucose) after you have not eaten for a while (fasting). You may have this done every 1-3 years.  Bone density scan. This is done to screen for osteoporosis. You may have this done starting at age 18.  Mammogram. This may be done every 1-2 years. Talk to your health care provider about how often you should have regular mammograms. Talk with your health care provider about your test results, treatment options, and if necessary,  the need for more tests. Vaccines  Your health care provider may recommend certain vaccines, such as:  Influenza vaccine. This is recommended every year.  Tetanus, diphtheria, and acellular pertussis (Tdap, Td) vaccine. You may need a Td booster every 10  years.  Zoster vaccine. You may need this after age 66.  Pneumococcal 13-valent conjugate (PCV13) vaccine. One dose is recommended after age 8.  Pneumococcal polysaccharide (PPSV23) vaccine. One dose is recommended after age 75. Talk to your health care provider about which screenings and vaccines you need and how often you need them. This information is not intended to replace advice given to you by your health care provider. Make sure you discuss any questions you have with your health care provider. Document Released: 11/29/2015 Document Revised: 07/22/2016 Document Reviewed: 09/03/2015 Elsevier Interactive Patient Education  2017 [place] Prevention in the Home Falls can cause injuries. They can happen to people of all ages. There are many things you can do to make your home safe and to help prevent falls. What can I do on the outside of my home?  Regularly fix the edges of walkways and driveways and fix any cracks.  Remove anything that might make you trip as you walk through a door, such as a raised step or threshold.  Trim any bushes or trees on the path to your home.  Use bright outdoor lighting.  Clear any walking paths of anything that might make someone trip, such as rocks or tools.  Regularly check to see if handrails are loose or broken. Make sure that both sides of any steps have handrails.  Any raised decks and porches should have guardrails on the edges.  Have any leaves, snow, or ice cleared regularly.  Use sand or salt on walking paths during winter.  Clean up any spills in your garage right away. This includes oil or grease spills. What can I do in the bathroom?  Use night lights.  Install grab bars by the toilet and in the tub and shower. Do not use towel bars as grab bars.  Use non-skid mats or decals in the tub or shower.  If you need to sit down in the shower, use a plastic, non-slip stool.  Keep the floor dry. Clean up any water that  spills on the floor as soon as it happens.  Remove soap buildup in the tub or shower regularly.  Attach bath mats securely with double-sided non-slip rug tape.  Do not have throw rugs and other things on the floor that can make you trip. What can I do in the bedroom?  Use night lights.  Make sure that you have a light by your bed that is easy to reach.  Do not use any sheets or blankets that are too big for your bed. They should not hang down onto the floor.  Have a firm chair that has side arms. You can use this for support while you get dressed.  Do not have throw rugs and other things on the floor that can make you trip. What can I do in the kitchen?  Clean up any spills right away.  Avoid walking on wet floors.  Keep items that you use a lot in easy-to-reach places.  If you need to reach something above you, use a strong step stool that has a grab bar.  Keep electrical cords out of the way.  Do not use floor polish or wax that makes floors slippery. If you must use  wax, use non-skid floor wax.  Do not have throw rugs and other things on the floor that can make you trip. What can I do with my stairs?  Do not leave any items on the stairs.  Make sure that there are handrails on both sides of the stairs and use them. Fix handrails that are broken or loose. Make sure that handrails are as long as the stairways.  Check any carpeting to make sure that it is firmly attached to the stairs. Fix any carpet that is loose or worn.  Avoid having throw rugs at the top or bottom of the stairs. If you do have throw rugs, attach them to the floor with carpet tape.  Make sure that you have a light switch at the top of the stairs and the bottom of the stairs. If you do not have them, ask someone to add them for you. What else can I do to help prevent falls?  Wear shoes that:  Do not have high heels.  Have rubber bottoms.  Are comfortable and fit you well.  Are closed at the  toe. Do not wear sandals.  If you use a stepladder:  Make sure that it is fully opened. Do not climb a closed stepladder.  Make sure that both sides of the stepladder are locked into place.  Ask someone to hold it for you, if possible.  Clearly mark and make sure that you can see:  Any grab bars or handrails.  First and last steps.  Where the edge of each step is.  Use tools that help you move around (mobility aids) if they are needed. These include:  Canes.  Walkers.  Scooters.  Crutches.  Turn on the lights when you go into a dark area. Replace any light bulbs as soon as they burn out.  Set up your furniture so you have a clear path. Avoid moving your furniture around.  If any of your floors are uneven, fix them.  If there are any pets around you, be aware of where they are.  Review your medicines with your doctor. Some medicines can make you feel dizzy. This can increase your chance of falling. Ask your doctor what other things that you can do to help prevent falls. This information is not intended to replace advice given to you by your health care provider. Make sure you discuss any questions you have with your health care provider. Document Released: 08/29/2009 Document Revised: 04/09/2016 Document Reviewed: 12/07/2014 Elsevier Interactive Patient Education  2017 [employer].

## 2020-12-10 NOTE — Progress Notes (Signed)
This service is provided via telemedicine  No vital signs collected/recorded due to the encounter was a telemedicine visit.   Location of patient (ex: home, work):  Home  Patient consents to a telephone visit:  Yes, see encounter dated 06/05/2020  Location of the provider (ex: office, home): [place]  Name of any referring provider: N/A  Names of all persons participating in the telemedicine service and their role in the encounter:  [name], Nurse Practitioner, Carroll Kinds, CMA, and patient.   Time spent on call:  8 minutes with medical assistant

## 2020-12-13 ENCOUNTER — Other Ambulatory Visit: Payer: Self-pay | Admitting: Nurse Practitioner

## 2020-12-13 ENCOUNTER — Encounter: Payer: Self-pay | Admitting: Nurse Practitioner

## 2020-12-13 ENCOUNTER — Other Ambulatory Visit: Payer: Self-pay

## 2020-12-13 ENCOUNTER — Ambulatory Visit (INDEPENDENT_AMBULATORY_CARE_PROVIDER_SITE_OTHER): Payer: Medicare HMO | Admitting: Nurse Practitioner

## 2020-12-13 VITALS — BP 146/80 | HR 73 | Temp 97.3°F | Ht 63.0 in | Wt 204.6 lb

## 2020-12-13 DIAGNOSIS — M1A0320 Idiopathic chronic gout, left wrist, without tophus (tophi): Secondary | ICD-10-CM

## 2020-12-13 DIAGNOSIS — I10 Essential (primary) hypertension: Secondary | ICD-10-CM

## 2020-12-13 DIAGNOSIS — M25521 Pain in right elbow: Secondary | ICD-10-CM

## 2020-12-13 NOTE — Patient Instructions (Signed)
Continue allopurinol.   To follow up with cardiology/pharmacologist for blood pressure   DASH Eating Plan DASH stands for Dietary Approaches to Stop Hypertension. The DASH eating plan is a healthy eating plan that has been shown to:  Reduce high blood pressure (hypertension).  Reduce your risk for type 2 diabetes, heart disease, and stroke.  Help with weight loss. What are tips for following this plan? Reading food labels  Check food labels for the amount of salt (sodium) per serving. Choose foods with less than 5 percent of the Daily Value of sodium. Generally, foods with less than 300 milligrams (mg) of sodium per serving fit into this eating plan.  To find whole grains, look for the word "whole" as the first word in the ingredient list. Shopping  Buy products labeled as "low-sodium" or "no salt added."  Buy fresh foods. Avoid canned foods and pre-made or frozen meals. Cooking  Avoid adding salt when cooking. Use salt-free seasonings or herbs instead of table salt or sea salt. Check with your health care provider or pharmacist before using salt substitutes.  Do not fry foods. Cook foods using healthy methods such as baking, boiling, grilling, roasting, and broiling instead.  Cook with heart-healthy oils, such as olive, canola, avocado, soybean, or sunflower oil. Meal planning  Eat a balanced diet that includes: ? 4 or more servings of fruits and 4 or more servings of vegetables each day. Try to fill one-half of your plate with fruits and vegetables. ? 6-8 servings of whole grains each day. ? Less than 6 oz (170 g) of lean meat, poultry, or fish each day. A 3-oz (85-g) serving of meat is about the same size as a deck of cards. One egg equals 1 oz (28 g). ? 2-3 servings of low-fat dairy each day. One serving is 1 cup (237 mL). ? 1 serving of nuts, seeds, or beans 5 times each week. ? 2-3 servings of heart-healthy fats. Healthy fats called omega-3 fatty acids are found in foods  such as walnuts, flaxseeds, fortified milks, and eggs. These fats are also found in cold-water fish, such as sardines, salmon, and mackerel.  Limit how much you eat of: ? Canned or prepackaged foods. ? Food that is high in trans fat, such as some fried foods. ? Food that is high in saturated fat, such as fatty meat. ? Desserts and other sweets, sugary drinks, and other foods with added sugar. ? Full-fat dairy products.  Do not salt foods before eating.  Do not eat more than 4 egg yolks a week.  Try to eat at least 2 vegetarian meals a week.  Eat more home-cooked food and less restaurant, buffet, and fast food.   Lifestyle  When eating at a restaurant, ask that your food be prepared with less salt or no salt, if possible.  If you drink alcohol: ? Limit how much you use to:  0-1 drink a day for women who are not pregnant.  0-2 drinks a day for men. ? Be aware of how much alcohol is in your drink. In the [place] one drink equals one 12 oz bottle of beer (355 mL), one 5 oz glass of wine (148 mL), or one 1 oz glass of hard liquor (44 mL). General information  Avoid eating more than 2,300 mg of salt a day. If you have hypertension, you may need to reduce your sodium intake to 1,500 mg a day.  Work with your health care provider to maintain a healthy body  weight or to lose weight. Ask what an ideal weight is for you.  Get at least 30 minutes of exercise that causes your heart to beat faster (aerobic exercise) most days of the week. Activities may include walking, swimming, or biking.  Work with your health care provider or dietitian to adjust your eating plan to your individual calorie needs. What foods should I eat? Fruits All fresh, dried, or frozen fruit. Canned fruit in natural juice (without added sugar). Vegetables Fresh or frozen vegetables (raw, steamed, roasted, or grilled). Low-sodium or reduced-sodium tomato and vegetable juice. Low-sodium or reduced-sodium tomato sauce and  tomato paste. Low-sodium or reduced-sodium canned vegetables. Grains Whole-grain or whole-wheat bread. Whole-grain or whole-wheat pasta. Brown rice. Michele Page. Bulgur. Whole-grain and low-sodium cereals. Pita bread. Low-fat, low-sodium crackers. Whole-wheat flour tortillas. Meats and other proteins Skinless chicken or [place]. Ground chicken or [place]. Pork with fat trimmed off. Fish and seafood. Egg whites. Dried beans, peas, or lentils. Unsalted nuts, nut butters, and seeds. Unsalted canned beans. Lean cuts of beef with fat trimmed off. Low-sodium, lean precooked or cured meat, such as sausages or meat loaves. Dairy Low-fat (1%) or fat-free (skim) milk. Reduced-fat, low-fat, or fat-free cheeses. Nonfat, low-sodium ricotta or cottage cheese. Low-fat or nonfat yogurt. Low-fat, low-sodium cheese. Fats and oils Soft margarine without trans fats. Vegetable oil. Reduced-fat, low-fat, or light mayonnaise and salad dressings (reduced-sodium). Canola, safflower, olive, avocado, soybean, and sunflower oils. Avocado. Seasonings and condiments Herbs. Spices. Seasoning mixes without salt. Other foods Unsalted popcorn and pretzels. Fat-free sweets. The items listed above may not be a complete list of foods and beverages you can eat. Contact a dietitian for more information. What foods should I avoid? Fruits Canned fruit in a light or heavy syrup. Fried fruit. Fruit in cream or butter sauce. Vegetables Creamed or fried vegetables. Vegetables in a cheese sauce. Regular canned vegetables (not low-sodium or reduced-sodium). Regular canned tomato sauce and paste (not low-sodium or reduced-sodium). Regular tomato and vegetable juice (not low-sodium or reduced-sodium). Michele Page. Olives. Grains Baked goods made with fat, such as croissants, muffins, or some breads. Dry pasta or rice meal packs. Meats and other proteins Fatty cuts of meat. Ribs. Fried meat. Michele Page. Bologna, salami, and other precooked or cured  meats, such as sausages or meat loaves. Fat from the back of a pig (fatback). Bratwurst. Salted nuts and seeds. Canned beans with added salt. Canned or smoked fish. Whole eggs or egg yolks. Chicken or [place] with skin. Dairy Whole or 2% milk, cream, and half-and-half. Whole or full-fat cream cheese. Whole-fat or sweetened yogurt. Full-fat cheese. Nondairy creamers. Whipped toppings. Processed cheese and cheese spreads. Fats and oils Butter. Stick margarine. Lard. Shortening. Ghee. Bacon fat. Tropical oils, such as coconut, palm kernel, or palm oil. Seasonings and condiments Onion salt, garlic salt, seasoned salt, table salt, and sea salt. Worcestershire sauce. Tartar sauce. Barbecue sauce. Teriyaki sauce. Soy sauce, including reduced-sodium. Steak sauce. Canned and packaged gravies. Fish sauce. Oyster sauce. Cocktail sauce. Store-bought horseradish. Ketchup. Mustard. Meat flavorings and tenderizers. Bouillon cubes. Hot sauces. Pre-made or packaged marinades. Pre-made or packaged taco seasonings. Relishes. Regular salad dressings. Other foods Salted popcorn and pretzels. The items listed above may not be a complete list of foods and beverages you should avoid. Contact a dietitian for more information. Where to find more information  National Heart, Lung, and Blood Institute: https://wilson-eaton.com/  American Heart Association: www.heart.org  Academy of Nutrition and Dietetics: www.eatright.[place]: www.kidney.org Summary  The DASH  eating plan is a healthy eating plan that has been shown to reduce high blood pressure (hypertension). It may also reduce your risk for type 2 diabetes, heart disease, and stroke.  When on the DASH eating plan, aim to eat more fresh fruits and vegetables, whole grains, lean proteins, low-fat dairy, and heart-healthy fats.  With the DASH eating plan, you should limit salt (sodium) intake to 2,300 mg a day. If you have hypertension, you may need to  reduce your sodium intake to 1,500 mg a day.  Work with your health care provider or dietitian to adjust your eating plan to your individual calorie needs. This information is not intended to replace advice given to you by your health care provider. Make sure you discuss any questions you have with your health care provider. Document Revised: 10/06/2019 Document Reviewed: 10/06/2019 Elsevier Patient Education  2021 [employer].

## 2020-12-13 NOTE — Progress Notes (Signed)
Careteam: Patient Care Team: [name], NP as PCP - General (Geriatric Medicine) Nicholas Lose, MD as Consulting Physician (Oncology) Erroll Luna, MD as Consulting Physician (General Surgery) Thea Silversmith, MD as Consulting Physician (Radiation Oncology)  PLACE OF SERVICE:  [facility] Directive information    No Known Allergies  Chief Complaint  Patient presents with  . Medical Management of Chronic Issues    Patient returns to the office for her 1 month follow up. She would like to discuss her right arm pain and left hand swelling/pain.   Mar[facility] Health Maintenance    TDAP, PCV13, Colonoscopy due. Patient refused COVID vacc.      HPI: Patient is a 65 y.o. female to follow up on blood pressure and gout.   She is following with a pharmacist who is helping her with her medication. She will not take hydralazine, states it does not work. Plans to follow up with cardiologist/pharmacist in regards to blood pressure.   Reports the hand is doing better but taking a while to see improvement.  Pain is now about a 3 and just about gone.  Not constant pain. No swelling.   Right arm is sore/hurting.  She is not able to sleep on that side due to the arm being sore/hurting. No pain at this time. It is the side effected by her stroke. No weakness noted.   She declines all vaccines at this time. "I am doing fine"  Review of Systems:  Review of Systems  Constitutional: Negative for chills, fever and weight loss.  HENT: Negative for tinnitus.   Respiratory: Negative for cough, sputum production and shortness of breath.   Cardiovascular: Negative for chest pain, palpitations and leg swelling.  Gastrointestinal: Negative for abdominal pain, constipation, diarrhea and heartburn.  Genitourinary: Negative for dysuria, frequency and urgency.  Musculoskeletal: Negative for back pain, falls, joint pain and myalgias.  Skin: Negative.   Neurological: Negative for dizziness  and headaches.  Psychiatric/Behavioral: Negative for depression and memory loss. The patient does not have insomnia.     Past Medical History:  Diagnosis Date  . Arthritis    lt knee  . Breast cancer ([place])   . Breast cancer of upper-outer quadrant of left female breast ([place]) 04/02/2016  . Colon polyps   . COPD (chronic obstructive pulmonary disease) ([place])   . Hyperlipidemia   . Hypertension   . Multiple sclerosis ([place])   . Personal history of chemotherapy   . Personal history of radiation therapy   . Stroke ([place])    2013, mild cognitive deficits  . Vision abnormalities    Past Surgical History:  Procedure Laterality Date  . BREAST BIOPSY Left 03/31/2016  . BREAST LUMPECTOMY Left 04/17/2016  . HYSTEROSCOPY WITH D & C N/A 12/19/2015   Procedure: DILATATION AND CURETTAGE /HYSTEROSCOPY;  Surgeon: Linda Hedges, DO;  Location: [facility];  Service: Gynecology;  Laterality: N/A;  . RADIOACTIVE SEED GUIDED PARTIAL MASTECTOMY WITH AXILLARY SENTINEL LYMPH NODE BIOPSY Left 04/17/2016   Procedure: RADIOACTIVE SEED GUIDED PARTIAL MASTECTOMY WITH AXILLARY SENTINEL LYMPH NODE BIOPSY;  Surgeon: Erroll Luna, MD;  Location: [facility];  Service: General;  Laterality: Left;  RADIOACTIVE SEED GUIDED PARTIAL MASTECTOMY WITH AXILLARY SENTINEL LYMPH NODE BIOPSY   . TUBAL LIGATION    . VAGINAL DELIVERY     x3   Social History:   reports that she quit smoking about 16 years ago. Her smoking use included cigarettes. She has a 12.50 pack-year smoking  history. She has never used smokeless tobacco. She reports current alcohol use. She reports that she does not use drugs.  Family History  Problem Relation Age of Onset  . Stroke Sister   . Hypertension Mother   . Stroke Mother   . Alcohol abuse Brother   . HIV/AIDS Brother     Medications: Patient's Medications  New Prescriptions   No medications on file  Previous Medications   ALLOPURINOL (ZYLOPRIM) 100 MG TABLET    Take 1 tablet (100 mg  total) by mouth daily.   ANASTROZOLE (ARIMIDEX) 1 MG TABLET    Take 1 tablet (1 mg total) by mouth daily.   ASPIRIN EC 81 MG TABLET    Take 1 tablet (81 mg total) by mouth daily.   ATORVASTATIN (LIPITOR) 20 MG TABLET    Take 1 tablet (20 mg total) by mouth daily.   ESZOPICLONE (LUNESTA) 2 MG TABS TABLET    Take 1 tablet (2 mg total) by mouth at bedtime as needed for sleep. Take immediately before bedtime   HYDRALAZINE (APRESOLINE) 25 MG TABLET    Take 1 tablet (25 mg total) by mouth 3 (three) times daily.   LABETALOL (NORMODYNE) 300 MG TABLET    Take 2 tablets (600 mg total) by mouth 3 (three) times daily.   NIFEDIPINE (PROCARDIA XL/NIFEDICAL-XL) 90 MG 24 HR TABLET    Take 1 tablet (90 mg total) by mouth daily.  Modified Medications   No medications on file  Discontinued Medications   No medications on file    Physical Exam:  Vitals:   12/13/20 1246  BP: (!) 146/80  Pulse: 73  Temp: (!) 97.3 F (36.3 C)  SpO2: 95%  Weight: 204 lb 9.6 oz (92.8 kg)  Height: 5\' 3"  (1.6 m)   Body mass index is 36.24 kg/m. Wt Readings from Last 3 Encounters:  12/13/20 204 lb 9.6 oz (92.8 kg)  11/18/20 203 lb (92.1 kg)  11/05/20 204 lb 1.6 oz (92.6 kg)    Physical Exam Constitutional:      General: She is not in acute distress.    Appearance: She is well-developed and well-nourished. She is not diaphoretic.  HENT:     Head: Normocephalic and atraumatic.     Mouth/Throat:     Mouth: Oropharynx is clear and moist.     Pharynx: No oropharyngeal exudate.  Eyes:     Conjunctiva/sclera: Conjunctivae normal.     Pupils: Pupils are equal, round, and reactive to light.  Cardiovascular:     Rate and Rhythm: Normal rate and regular rhythm.     Heart sounds: Normal heart sounds.  Pulmonary:     Effort: Pulmonary effort is normal.     Breath sounds: Normal breath sounds.  Abdominal:     General: Bowel sounds are normal.     Palpations: Abdomen is soft.  Musculoskeletal:        General: No edema.      Right elbow: Normal range of motion. Tenderness present.       Arms:     Cervical back: Normal range of motion and neck supple.     Comments: Tenderness to right lateral elbow   Skin:    General: Skin is warm and dry.  Neurological:     Mental Status: She is alert and oriented to person, place, and time.  Psychiatric:        Mood and Affect: Mood and affect normal.     Labs reviewed: Basic Metabolic Panel: Recent  Labs    05/13/20 0930 07/29/20 1655 11/18/20 0000  NA 146 146* 142  K 4.3 4.0 4.2  CL 109 106 106  CO2 27 25 24   GLUCOSE 101* 107* 91  BUN 23 23 15   CREATININE 1.43* 1.79* 1.33*  CALCIUM 10.1 9.9 9.9   Liver Function Tests: Recent Labs    05/13/20 0930 11/18/20 0000  AST 19 25  ALT 20 20  BILITOT 0.5 0.6  PROT 7.0 7.3   No results for input(s): LIPASE, AMYLASE in the last 8760 hours. No results for input(s): AMMONIA in the last 8760 hours. CBC: Recent Labs    05/13/20 0930 11/18/20 0000  WBC 5.4 5.4  NEUTROABS 3,386 3,175  HGB 12.0 12.9  HCT 35.3 37.0  MCV 92.2 91.6  PLT 272 305   Lipid Panel: Recent Labs    05/13/20 0930  CHOL 137  HDL 70  LDLCALC 55  TRIG 50  CHOLHDL 2.0   TSH: No results for input(s): TSH in the last 8760 hours. A1C: Lab Results  Component Value Date   HGBA1C 5.8 (H) 05/13/2020     Assessment/Plan 1. Right elbow pain -ongoing for the last 2 months. She was on prednisone taper for gout flare which did not help - AMB referral to orthopedics for further evaluation and treatment.   2. Essential hypertension Pt reports hydralazine does not help and would not take medication. States she is following with pharmacy and cardiologist in regards to her blood pressure. Encouraged follow up.   3. Chronic gout of left wrist, unspecified cause Improved pain and swelling. Continues on allopurinol 100 mg daily. Continue low purine diet.   Next appt: 4 months   [facility] [place], [facility] [phone]

## 2020-12-26 ENCOUNTER — Other Ambulatory Visit: Payer: Self-pay

## 2020-12-26 ENCOUNTER — Ambulatory Visit (AMBULATORY_SURGERY_CENTER): Payer: Medicare HMO | Admitting: *Deleted

## 2020-12-26 VITALS — Ht 63.0 in | Wt 200.0 lb

## 2020-12-26 DIAGNOSIS — Z86010 Personal history of colonic polyps: Secondary | ICD-10-CM

## 2020-12-26 MED ORDER — SUPREP BOWEL PREP KIT 17.5-3.13-1.6 GM/177ML PO SOLN: 1.0000 | Freq: Once | ORAL | 0 refills | Status: AC

## 2020-12-26 NOTE — Progress Notes (Signed)
No egg or soy allergy known to patient  No issues with past sedation with any surgeries or procedures No intubation problems in the past  No FH of Malignant Hyperthermia No diet pills per patient No home 02 use per patient  No blood thinners per patient  Pt denies issues with constipation  No A fib or A flutter  EMMI video to pt or via [place] 19 guidelines implemented in PV today with Pt and RN  Pt is fully vaccinated  for Covid   Pt denies loose teeth , she has  missing teeth denies dentures, partials, dental implants, capped or bonded teeth  Due to the COVID-19 pandemic we are asking patients to follow certain guidelines.  Pt aware of COVID protocols and [facility] guidelines

## 2021-01-06 ENCOUNTER — Other Ambulatory Visit: Payer: Self-pay | Admitting: Internal Medicine

## 2021-01-06 ENCOUNTER — Other Ambulatory Visit: Payer: Self-pay | Admitting: Nurse Practitioner

## 2021-01-06 DIAGNOSIS — Z1159 Encounter for screening for other viral diseases: Secondary | ICD-10-CM | POA: Diagnosis not present

## 2021-01-06 DIAGNOSIS — E7800 Pure hypercholesterolemia, unspecified: Secondary | ICD-10-CM

## 2021-01-07 ENCOUNTER — Ambulatory Visit: Payer: Medicare HMO | Admitting: Orthopaedic Surgery

## 2021-01-07 ENCOUNTER — Encounter: Payer: Self-pay | Admitting: Internal Medicine

## 2021-01-08 LAB — SARS CORONAVIRUS 2 (TAT 6-24 HRS): SARS Coronavirus 2: NEGATIVE

## 2021-01-09 ENCOUNTER — Ambulatory Visit (AMBULATORY_SURGERY_CENTER): Payer: Medicare HMO | Admitting: Internal Medicine

## 2021-01-09 ENCOUNTER — Encounter: Payer: Self-pay | Admitting: Internal Medicine

## 2021-01-09 ENCOUNTER — Other Ambulatory Visit: Payer: Self-pay

## 2021-01-09 VITALS — BP 136/81 | HR 78 | Temp 97.1°F | Resp 13 | Ht 63.0 in | Wt 200.0 lb

## 2021-01-09 DIAGNOSIS — D122 Benign neoplasm of ascending colon: Secondary | ICD-10-CM | POA: Diagnosis not present

## 2021-01-09 DIAGNOSIS — D123 Benign neoplasm of transverse colon: Secondary | ICD-10-CM

## 2021-01-09 DIAGNOSIS — Z86010 Personal history of colonic polyps: Secondary | ICD-10-CM

## 2021-01-09 DIAGNOSIS — J449 Chronic obstructive pulmonary disease, unspecified: Secondary | ICD-10-CM | POA: Diagnosis not present

## 2021-01-09 DIAGNOSIS — D124 Benign neoplasm of descending colon: Secondary | ICD-10-CM | POA: Diagnosis not present

## 2021-01-09 DIAGNOSIS — G35 Multiple sclerosis: Secondary | ICD-10-CM | POA: Diagnosis not present

## 2021-01-09 MED ORDER — SODIUM CHLORIDE 0.9 % IV SOLN
500.0000 mL | Freq: Once | INTRAVENOUS | Status: DC
Start: 2021-01-09 — End: 2021-01-09

## 2021-01-09 NOTE — Progress Notes (Signed)
Pt's states no medical or surgical changes since previsit or office visit.  VS CW  

## 2021-01-09 NOTE — Patient Instructions (Signed)
Handouts on polyps, hemorrhoids, and diverticulosis given to you today  Await pathology results    YOU HAD AN ENDOSCOPIC PROCEDURE TODAY AT THE  ENDOSCOPY CENTER:   Refer to the procedure report that was given to you for any specific questions about what was found during the examination.  If the procedure report does not answer your questions, please call your gastroenterologist to clarify.  If you requested that your care partner not be given the details of your procedure findings, then the procedure report has been included in a sealed envelope for you to review at your convenience later.  YOU SHOULD EXPECT: Some feelings of bloating in the abdomen. Passage of more gas than usual.  Walking can help get rid of the air that was put into your GI tract during the procedure and reduce the bloating. If you had a lower endoscopy (such as a colonoscopy or flexible sigmoidoscopy) you may notice spotting of blood in your stool or on the toilet paper. If you underwent a bowel prep for your procedure, you may not have a normal bowel movement for a few days.  Please Note:  You might notice some irritation and congestion in your nose or some drainage.  This is from the oxygen used during your procedure.  There is no need for concern and it should clear up in a day or so.  SYMPTOMS TO REPORT IMMEDIATELY:   Following lower endoscopy (colonoscopy or flexible sigmoidoscopy):  Excessive amounts of blood in the stool  Significant tenderness or worsening of abdominal pains  Swelling of the abdomen that is new, acute  Fever of 100F or higher  For urgent or emergent issues, a gastroenterologist can be reached at any hour by calling [phone]. Do not use MyChart messaging for urgent concerns.    DIET:  We do recommend a small meal at first, but then you may proceed to your regular diet.  Drink plenty of fluids but you should avoid alcoholic beverages for 24 hours.  ACTIVITY:  You should plan to take  it easy for the rest of today and you should NOT DRIVE or use heavy machinery until tomorrow (because of the sedation medicines used during the test).    FOLLOW UP: Our staff will call the number listed on your records 48-72 hours following your procedure to check on you and address any questions or concerns that you may have regarding the information given to you following your procedure. If we do not reach you, we will leave a message.  We will attempt to reach you two times.  During this call, we will ask if you have developed any symptoms of COVID 19. If you develop any symptoms (ie: fever, flu-like symptoms, shortness of breath, cough etc.) before then, please call [phone].  If you test positive for Covid 19 in the 2 weeks post procedure, please call and report this information to us.    If any biopsies were taken you will be contacted by phone or by letter within the next 1-3 weeks.  Please call us at [phone] if you have not heard about the biopsies in 3 weeks.    SIGNATURES/CONFIDENTIALITY: You and/or your care partner have signed paperwork which will be entered into your electronic medical record.  These signatures attest to the fact that that the information above on your After Visit Summary has been reviewed and is understood.  Full responsibility of the confidentiality of this discharge information lies with you and/or your care-partner. 

## 2021-01-09 NOTE — Progress Notes (Signed)
Called to room to assist during endoscopic procedure.  Patient ID and intended procedure confirmed with present staff. Received instructions for my participation in the procedure from the performing physician.  

## 2021-01-09 NOTE — Op Note (Signed)
[place] Patient Name: Michele Page Procedure Date: 01/09/2021 11:04 AM MRN: [id number] Endoscopist: Jerene Bears , MD Age: 65 Referring MD:  Date of Birth: 1956-10-23 Gender: Female Account #: [id number] Procedure:                Colonoscopy Indications:              High risk colon cancer surveillance: Personal                            history of multiple (3 or more) adenomas, Last                            colonoscopy: August 2016 Medicines:                Monitored Anesthesia Care Procedure:                Pre-Anesthesia Assessment:                           - Prior to the procedure, a History and Physical                            was performed, and patient medications and                            allergies were reviewed. The patient's tolerance of                            previous anesthesia was also reviewed. The risks                            and benefits of the procedure and the sedation                            options and risks were discussed with the patient.                            All questions were answered, and informed consent                            was obtained. Prior Anticoagulants: The patient has                            taken no previous anticoagulant or antiplatelet                            agents. ASA Grade Assessment: II - A patient with                            mild systemic disease. After reviewing the risks                            and benefits, the patient was deemed in  satisfactory condition to undergo the procedure.                           After obtaining informed consent, the colonoscope                            was passed under direct vision. Throughout the                            procedure, the patient's blood pressure, pulse, and                            oxygen saturations were monitored continuously. The                            Olympus PFC-[id number] (#[id number]) Colonoscope was                             introduced through the anus and advanced to the                            cecum, identified by appendiceal orifice and                            ileocecal valve. The colonoscopy was performed                            without difficulty. The patient tolerated the                            procedure well. The quality of the bowel                            preparation was good. The ileocecal valve,                            appendiceal orifice, and rectum were photographed. Scope In: 11:13:43 AM Scope Out: 11:33:55 AM Scope Withdrawal Time: 0 hours 15 minutes 4 seconds  Total Procedure Duration: 0 hours 20 minutes 12 seconds  Findings:                 The digital rectal exam was normal.                           Three sessile polyps were found in the ascending                            colon. The polyps were 3 to 5 mm in size. These                            polyps were removed with a cold snare. Resection                            and retrieval were complete.  Three sessile polyps were found in the transverse                            colon. The polyps were 3 to 6 mm in size. These                            polyps were removed with a cold snare. Resection                            and retrieval were complete.                           A 6 mm polyp was found in the descending colon. The                            polyp was sessile. The polyp was removed with a                            cold snare. Resection and retrieval were complete.                           Many small and large-mouthed diverticula were found                            from ascending colon to cecum.                           Retroflexion in the rectum was not performed due to                            narrow rectal vault. Small internal hemorrhoids                            were seen just proximal to the dentate line. Complications:            No  immediate complications. Estimated Blood Loss:     Estimated blood loss was minimal. Impression:               - Three 3 to 5 mm polyps in the ascending colon,                            removed with a cold snare. Resected and retrieved.                           - Three 3 to 6 mm polyps in the transverse colon,                            removed with a cold snare. Resected and retrieved.                           - One 6 mm polyp in the descending colon, removed  with a cold snare. Resected and retrieved.                           - Diverticulosis from ascending colon to cecum.                           - Small internal hemorrhoids. Recommendation:           - Patient has a contact number available for                            emergencies. The signs and symptoms of potential                            delayed complications were discussed with the                            patient. Return to normal activities tomorrow.                            Written discharge instructions were provided to the                            patient.                           - Resume previous diet.                           - Continue present medications.                           - Await pathology results.                           - Repeat colonoscopy is recommended for                            surveillance. The colonoscopy date will be                            determined after pathology results from today's                            exam become available for review. Jerene Bears, MD 01/09/2021 11:37:28 AM This report has been signed electronically.

## 2021-01-09 NOTE — Progress Notes (Signed)
Report to PACU, RN, vss, BBS= Clear.  

## 2021-01-13 ENCOUNTER — Telehealth: Payer: Self-pay | Admitting: Cardiovascular Disease

## 2021-01-13 ENCOUNTER — Telehealth: Payer: Self-pay | Admitting: Nurse Practitioner

## 2021-01-13 ENCOUNTER — Telehealth: Payer: Self-pay | Admitting: *Deleted

## 2021-01-13 NOTE — Telephone Encounter (Signed)
Pt called to say that referral that was sent over/enter was incorrect...  Ms Michele Page has left dull hand pain that runs from thumb to wrist daily. She's concerned that it is not arthritis.   Please advise if new referral is needed & I'll be glad to send it.  Thanks,Lisa M.

## 2021-01-13 NOTE — Telephone Encounter (Signed)
She has gout in her hand at the time of visit she was c/o elbow pain. Is this not still going on?

## 2021-01-13 NOTE — Telephone Encounter (Signed)
Spoke with pt, the nifedipine is not working. She has been taking 90 mg twice daily for 2 months now and her bp is still running 170-175/80. She reports she will take the medication but 4 hours later her bp will be elevated again. She would like to know what to do and would like a call back today. Will forward to the pharm md.

## 2021-01-13 NOTE — Telephone Encounter (Signed)
She should stop taking 2 nifedipine capsules per day.  She should continue with 90 mg once daily.  She should also be taking her labetalol 600 mg tid. (she tends to be non-compliant).   If she is taking both of these, then she needs to continue them and add spironolactone 25 mg once daily.  She will need metabolic panel in 2 weeks after starting spironolactone and a follow up with PharmD or APP in 3-4 weeks.

## 2021-01-13 NOTE — Telephone Encounter (Signed)
Pt c/o medication issue:  1. Name of Medication: NIFEdipine (PROCARDIA XL/NIFEDICAL-XL) 90 MG 24 hr tablet  2. How are you currently taking this medication (dosage and times per day)? 1 tablet daily  3. Are you having a reaction (difficulty breathing--STAT)? no  4. What is your medication issue? Patient states the medication is not working and she needs something stronger. She states is not controlling her BP. She states it will control if for an hour and then her BP goes back up. She states the highest dose of the medication is 90 mg so is not sure if she needs to be on a different medication. She states she needs someone to call her back today.

## 2021-01-13 NOTE — Telephone Encounter (Signed)
  Follow up Call-  Call back number 01/09/2021  Post procedure Call Back phone  # [phone]  Permission to leave phone message Yes  Some recent data might be hidden     Patient questions:  Do you have a fever, pain , or abdominal swelling? No. Pain Score  0 *  Have you tolerated food without any problems? Yes.    Have you been able to return to your normal activities? Yes.    Do you have any questions about your discharge instructions: Diet   No. Medications  No. Follow up visit  No.  Do you have questions or concerns about your Care? No.  Actions: * If pain score is 4 or above: 1. No action needed, pain <4.Have you developed a fever since your procedure? no  2.   Have you had an respiratory symptoms (SOB or cough) since your procedure? no  3.   Have you tested positive for COVID 19 since your procedure no  4.   Have you had any family members/close contacts diagnosed with the COVID 19 since your procedure?  no   If yes to any of these questions please route to Joylene John, RN and Joella Prince, RN

## 2021-01-14 NOTE — Telephone Encounter (Signed)
Ms Fouts denied any pain or problem with her elbow & acted as if the referral was made for the wrong issue.  She stated that [facility] called to schedule her for the elbow & she didn't need it. They ask her to reach out to her pcp for another referral.  After Ms Doner expressing her "hand" concerns/pain, I told her that I would let you advise on if she needed to see a hand specialist vs ortho.  Thanks, Vilinda Blanks

## 2021-01-14 NOTE — Telephone Encounter (Signed)
Yes she will need to make appt. Thank you.

## 2021-01-15 ENCOUNTER — Encounter: Payer: Self-pay | Admitting: Internal Medicine

## 2021-01-15 NOTE — Telephone Encounter (Signed)
Patient called to follow up on call from Monday. She states she has not heard anything from the office after talking with the Nurse. Please advise

## 2021-01-15 NOTE — Telephone Encounter (Signed)
Pt updated with Pharm D recommendations and voiced that spironolactone is not going to work. Pt state she never took the medication before but just know it not going to work. She state the only thing that works for her is the brand name medication for nifedipine.

## 2021-01-16 MED ORDER — PROCARDIA XL 90 MG PO TB24: 90.0000 mg | ORAL_TABLET | Freq: Every day | ORAL | 3 refills | Status: DC

## 2021-01-16 NOTE — Telephone Encounter (Signed)
Pt can have brand Procardia XL 90 mg 1 qd if she wants, but she will have to pay the price difference for brand.

## 2021-01-16 NOTE — Telephone Encounter (Signed)
Spoke with pt, aware procardia called into the pharmacy.

## 2021-01-17 ENCOUNTER — Ambulatory Visit (INDEPENDENT_AMBULATORY_CARE_PROVIDER_SITE_OTHER): Payer: Medicare HMO | Admitting: Nurse Practitioner

## 2021-01-17 ENCOUNTER — Other Ambulatory Visit: Payer: Self-pay

## 2021-01-17 ENCOUNTER — Encounter: Payer: Self-pay | Admitting: Nurse Practitioner

## 2021-01-17 VITALS — BP 160/90 | HR 63 | Temp 97.7°F | Ht 63.0 in | Wt 206.0 lb

## 2021-01-17 DIAGNOSIS — M1A0320 Idiopathic chronic gout, left wrist, without tophus (tophi): Secondary | ICD-10-CM

## 2021-01-17 NOTE — Progress Notes (Signed)
Careteam: Patient Care Team: [name], NP as PCP - General (Geriatric Medicine) Nicholas Lose, MD as Consulting Physician (Oncology) Erroll Luna, MD as Consulting Physician (General Surgery) Thea Silversmith, MD as Consulting Physician (Radiation Oncology)  PLACE OF SERVICE:  [facility] Directive information    No Known Allergies  Chief Complaint  Patient presents with  . Acute Visit    Left hand pain. Ongoing for about two months. Swelling along thumb line. Has been red around wrist area. Does not feel warm to the touch. Pain goes from thumb to wrist.  Discuss the need for COVID 19 vaccine, Tetanus/Tdap vaccine, pneumonia vaccine and completion of Dexa scan     HPI: Patient is a 65 y.o. female to follow up thumb pain. Reports elbow pain resolved.   She has had off and on thumb/wrist pain on left. Reports soreness has improved but continues to ache.  Reports it will swell and then go down.   Wrist is better but now more pain to base of thumb.  Reports overall it is feeling better. Pain is gone to wrist and finger. She can move thumb with full ROM and only has a little bit.  No pain currently.   Discussed with cardiology and plan to increase procardia to 90 mg daily, has not started as of yet.  Review of Systems:  Review of Systems  Constitutional: Negative for chills and fever.  Musculoskeletal: Positive for joint pain. Negative for back pain and myalgias.    Past Medical History:  Diagnosis Date  . Anemia    past hx many yrs ago   . Arthritis    lt knee  . Breast cancer ([place])   . Breast cancer of upper-outer quadrant of left female breast ([place]) 04/02/2016  . Chronic kidney disease    CKD  . Colon polyps   . COPD (chronic obstructive pulmonary disease) ([place])   . GERD (gastroesophageal reflux disease)    years ago- not current   . Hyperlipidemia   . Hypertension   . Multiple sclerosis ([place])   . Personal history of chemotherapy   .  Personal history of radiation therapy   . Stroke ([place])    2013, mild cognitive deficits  . Vision abnormalities    Past Surgical History:  Procedure Laterality Date  . BREAST BIOPSY Left 03/31/2016  . BREAST LUMPECTOMY Left 04/17/2016  . COLONOSCOPY    . HYSTEROSCOPY WITH D & C N/A 12/19/2015   Procedure: DILATATION AND CURETTAGE /HYSTEROSCOPY;  Surgeon: Linda Hedges, DO;  Location: [facility];  Service: Gynecology;  Laterality: N/A;  . POLYPECTOMY    . RADIOACTIVE SEED GUIDED PARTIAL MASTECTOMY WITH AXILLARY SENTINEL LYMPH NODE BIOPSY Left 04/17/2016   Procedure: RADIOACTIVE SEED GUIDED PARTIAL MASTECTOMY WITH AXILLARY SENTINEL LYMPH NODE BIOPSY;  Surgeon: Erroll Luna, MD;  Location: [facility];  Service: General;  Laterality: Left;  RADIOACTIVE SEED GUIDED PARTIAL MASTECTOMY WITH AXILLARY SENTINEL LYMPH NODE BIOPSY   . TUBAL LIGATION    . VAGINAL DELIVERY     x3   Social History:   reports that she quit smoking about 16 years ago. Her smoking use included cigarettes. She has a 12.50 pack-year smoking history. She has never used smokeless tobacco. She reports current alcohol use. She reports that she does not use drugs.  Family History  Problem Relation Age of Onset  . Stroke Sister   . Hypertension Mother   . Stroke Mother   . Alcohol abuse Brother   .  HIV/AIDS Brother   . Colon cancer Neg Hx   . Colon polyps Neg Hx   . Esophageal cancer Neg Hx   . Rectal cancer Neg Hx   . Stomach cancer Neg Hx     Medications: Patient's Medications  New Prescriptions   No medications on file  Previous Medications   ALLOPURINOL (ZYLOPRIM) 100 MG TABLET    Take 1 tablet (100 mg total) by mouth daily.   ANASTROZOLE (ARIMIDEX) 1 MG TABLET    Take 1 tablet (1 mg total) by mouth daily.   ASPIRIN EC 81 MG TABLET    Take 1 tablet (81 mg total) by mouth daily.   ATORVASTATIN (LIPITOR) 20 MG TABLET    TAKE 1 TABLET EVERY DAY   ESZOPICLONE (LUNESTA) 2 MG TABS TABLET    Take 1 tablet (2  mg total) by mouth at bedtime as needed for sleep. Take immediately before bedtime   LABETALOL (NORMODYNE) 300 MG TABLET    TAKE 2 TABLETS THREE TIMES DAILY   PROCARDIA XL 90 MG 24 HR TABLET    Take 1 tablet (90 mg total) by mouth daily.  Modified Medications   No medications on file  Discontinued Medications   No medications on file    Physical Exam:  Vitals:   01/17/21 1411  BP: (!) 160/90  Pulse: 63  Temp: 97.7 F (36.5 C)  TempSrc: Temporal  SpO2: 98%  Weight: 206 lb (93.4 kg)  Height: 5\' 3"  (1.6 m)   Body mass index is 36.49 kg/m. Wt Readings from Last 3 Encounters:  01/17/21 206 lb (93.4 kg)  01/09/21 200 lb (90.7 kg)  12/26/20 200 lb (90.7 kg)    Physical Exam Constitutional:      Appearance: Normal appearance.  Cardiovascular:     Rate and Rhythm: Normal rate and regular rhythm.  Pulmonary:     Effort: Pulmonary effort is normal.     Breath sounds: Normal breath sounds.  Musculoskeletal:     Right hand: Normal. No swelling, tenderness or bony tenderness. Normal range of motion.     Left hand: Normal. No swelling, tenderness or bony tenderness. Normal range of motion.  Skin:    General: Skin is warm and dry.  Neurological:     Mental Status: She is alert.     Labs reviewed: Basic Metabolic Panel: Recent Labs    05/13/20 0930 07/29/20 1655 11/18/20 0000  NA 146 146* 142  K 4.3 4.0 4.2  CL 109 106 106  CO2 27 25 24   GLUCOSE 101* 107* 91  BUN 23 23 15   CREATININE 1.43* 1.79* 1.33*  CALCIUM 10.1 9.9 9.9   Liver Function Tests: Recent Labs    05/13/20 0930 11/18/20 0000  AST 19 25  ALT 20 20  BILITOT 0.5 0.6  PROT 7.0 7.3   No results for input(s): LIPASE, AMYLASE in the last 8760 hours. No results for input(s): AMMONIA in the last 8760 hours. CBC: Recent Labs    05/13/20 0930 11/18/20 0000  WBC 5.4 5.4  NEUTROABS 3,386 3,175  HGB 12.0 12.9  HCT 35.3 37.0  MCV 92.2 91.6  PLT 272 305   Lipid Panel: Recent Labs     05/13/20 0930  CHOL 137  HDL 70  LDLCALC 55  TRIG 50  CHOLHDL 2.0   TSH: No results for input(s): TSH in the last 8760 hours. A1C: Lab Results  Component Value Date   HGBA1C 5.8 (H) 05/13/2020     Assessment/Plan 1. Chronic  gout of left wrist, unspecified cause Improved at this time. Continues on allopurinol 100 mg daily  -continue low purine diet.  - Uric Acid  Next appt: 04/18/2021 [facility] American. [place] [phone]

## 2021-01-17 NOTE — Patient Instructions (Signed)

## 2021-01-18 LAB — URIC ACID: Uric Acid, Serum: 6.4 mg/dL (ref 2.5–7.0)

## 2021-01-23 MED ORDER — NIFEDIPINE ER OSMOTIC RELEASE 60 MG PO TB24: 60.0000 mg | ORAL_TABLET | Freq: Two times a day (BID) | ORAL | 3 refills | Status: DC

## 2021-01-23 NOTE — Telephone Encounter (Signed)
Pt agreeable to switching to nifedipine xl 60 mg BID. New prescription sent to pharmacy.

## 2021-01-23 NOTE — Telephone Encounter (Signed)
Pt called in and stated that her ins Is not going to cover the procardia .  She stated she will be out of these meds tomorrow. She is going call the ins compy to see what is in her formulary that compares or will cover and give [place] a call back   She would like a call back today because she will out tomorrow   Best number [phone]

## 2021-01-23 NOTE — Telephone Encounter (Signed)
Thx for the heads up

## 2021-01-23 NOTE — Telephone Encounter (Signed)
Pt made aware of Pharm D's recommendation and state she refuse to take spironolactone. Pt state she know her body better than anyone and taking that medication is a waste of her time. Nurse then explained that at this point it best to schedule and office visit to discuss further. Pt state she currently only have one pill left of nifedipine and if she doesn't get this resolved today, her BP will increase and she will end up in the ER.   Pharm D made aware for recommendations.

## 2021-01-23 NOTE — Telephone Encounter (Signed)
Please see previous message. Spironolactone was already recommended and patient refused trial with medication.  Patient should continue taking labetalol, add spironolactone 25mg  daily and repeat BMET 10 days after initiation.  Okay to try nifedipine XL instead of procardia brand name.  Otherwise, she should continue labetalol as schedule, and make appointment with APP to discuss other options.  Pharmacist next available is April/7th.

## 2021-01-23 NOTE — Telephone Encounter (Signed)
Spoke to pt. She report her insurance will not cover procardia and can't afford the price. Pt state she is only have 1 day left of BP medication and need something different that her insurance will cover.   Will forward to Pharm D

## 2021-01-23 NOTE — Addendum Note (Signed)
Addended by: Meryl Crutch on: 01/23/2021 03:04 PM   Modules accepted: Orders

## 2021-01-23 NOTE — Telephone Encounter (Signed)
Spoke with pharmacy.  They state patient has been getting nifedipine xl 90 mg each month for the past 3 months.  Using GoodRx for pricing, approx $25/month.  Her insurance will not pay for brand Procardia.  Patient states it's not working as well, is upset with pharmacy because they cannot get the same manufacturer that they previously did - thinks this is why med is not working.    Her original message states she asked if she needs a different medication, as this is not working anymore.  We offered spironolactone, but she refused to purchase, stating that she had never taken it before, but knows it won't work.  States the only thing that works is brand Procardia XL.  Not sure if she is taking the labetalol she was previously noted to be on.    She has been seen multiple times in [facility] - we have made medications changes, however she will then come for follow up and be back on her original regimen of nifedipine and labetalol and tell us that whatever was prescribed did not work.  (amloidpine, chlorthalidone, hydralazine).  Her last 2 office visits BP was controlled on labetalol 600 mg tid and nifedipine xl 60 mg bid.    Would recommend that she can go back to nifedipine xl 60 mg bid, but can make no other recommendations at this time.  Patient has been rude to nursing staff when she calls, and does not seem willing to try any recommendations coming from the office.

## 2021-02-24 ENCOUNTER — Other Ambulatory Visit: Payer: Self-pay | Admitting: *Deleted

## 2021-02-24 DIAGNOSIS — F5101 Primary insomnia: Secondary | ICD-10-CM

## 2021-02-24 MED ORDER — ESZOPICLONE 2 MG PO TABS: 2.0000 mg | ORAL_TABLET | Freq: Every evening | ORAL | 0 refills | Status: DC | PRN

## 2021-02-24 NOTE — Telephone Encounter (Signed)
Patient requested refill Epic LR: 11/18/2020 Pended Rx and sent to [facility] for approval.

## 2021-03-12 ENCOUNTER — Other Ambulatory Visit: Payer: Self-pay | Admitting: Cardiovascular Disease

## 2021-04-18 ENCOUNTER — Encounter: Payer: Self-pay | Admitting: Nurse Practitioner

## 2021-04-18 ENCOUNTER — Other Ambulatory Visit: Payer: Self-pay

## 2021-04-18 ENCOUNTER — Ambulatory Visit (INDEPENDENT_AMBULATORY_CARE_PROVIDER_SITE_OTHER): Payer: Medicare HMO | Admitting: Nurse Practitioner

## 2021-04-18 VITALS — BP 134/78 | HR 80 | Temp 97.1°F | Ht 63.0 in | Wt 199.0 lb

## 2021-04-18 DIAGNOSIS — F5101 Primary insomnia: Secondary | ICD-10-CM

## 2021-04-18 DIAGNOSIS — C50412 Malignant neoplasm of upper-outer quadrant of left female breast: Secondary | ICD-10-CM | POA: Diagnosis not present

## 2021-04-18 DIAGNOSIS — M1A0320 Idiopathic chronic gout, left wrist, without tophus (tophi): Secondary | ICD-10-CM | POA: Diagnosis not present

## 2021-04-18 DIAGNOSIS — G35 Multiple sclerosis: Secondary | ICD-10-CM

## 2021-04-18 DIAGNOSIS — I10 Essential (primary) hypertension: Secondary | ICD-10-CM | POA: Diagnosis not present

## 2021-04-18 DIAGNOSIS — I69359 Hemiplegia and hemiparesis following cerebral infarction affecting unspecified side: Secondary | ICD-10-CM | POA: Diagnosis not present

## 2021-04-18 DIAGNOSIS — Z170 Estrogen receptor positive status [ER+]: Secondary | ICD-10-CM

## 2021-04-18 DIAGNOSIS — Z23 Encounter for immunization: Secondary | ICD-10-CM

## 2021-04-18 DIAGNOSIS — E7800 Pure hypercholesterolemia, unspecified: Secondary | ICD-10-CM | POA: Diagnosis not present

## 2021-04-18 NOTE — Progress Notes (Signed)
Careteam: Patient Care Team: [name], NP as PCP - General (Geriatric Medicine) Nicholas Lose, MD as Consulting Physician (Oncology) Erroll Luna, MD as Consulting Physician (General Surgery) Thea Silversmith, MD as Consulting Physician (Radiation Oncology)  PLACE OF SERVICE:  [place] Directive information Does Patient Have a Medical Advance Directive?: No, Would patient like information on creating a medical advance directive?: No - Patient declined  No Known Allergies  Chief Complaint  Patient presents with  . Medical Management of Chronic Issues    4 month follow-up. Refill Lunesta at local pharmacy. Patient refused all vaccine recommendations. Patient c/o cough and congestion x 4 days      HPI: Patient is a 65 y.o. female for routine follow up.  Reports she has a cough in her throat on her left and reports increase congestion on the left side of her head. Overall better.   Gout- pain is gone at this time. Continues on allopurinol 100 mg daily   Hx of breast cancer- continues on anastrozole daily   Insomnia- completed lunesta- unsure  Has tried different things to help her sleep and nothing has work.   MS- followed by neurology, has been stable. No flares.   Review of Systems:  Review of Systems  Constitutional: Negative for chills, fever and weight loss.  HENT: Negative for tinnitus.   Respiratory: Negative for cough, sputum production and shortness of breath.   Cardiovascular: Negative for chest pain, palpitations and leg swelling.  Gastrointestinal: Negative for abdominal pain, constipation, diarrhea and heartburn.  Genitourinary: Negative for dysuria, frequency and urgency.  Musculoskeletal: Negative for back pain, falls, joint pain and myalgias.  Skin: Negative.   Neurological: Negative for dizziness and headaches.  Psychiatric/Behavioral: Negative for depression and memory loss. The patient does not have insomnia.     Past  Medical History:  Diagnosis Date  . Anemia    past hx many yrs ago   . Arthritis    lt knee  . Breast cancer ([place])   . Breast cancer of upper-outer quadrant of left female breast ([place]) 04/02/2016  . Chronic kidney disease    CKD  . Colon polyps   . COPD (chronic obstructive pulmonary disease) ([place])   . GERD (gastroesophageal reflux disease)    years ago- not current   . Hyperlipidemia   . Hypertension   . Multiple sclerosis ([place])   . Personal history of chemotherapy   . Personal history of radiation therapy   . Stroke ([place])    2013, mild cognitive deficits  . Vision abnormalities    Past Surgical History:  Procedure Laterality Date  . BREAST BIOPSY Left 03/31/2016  . BREAST LUMPECTOMY Left 04/17/2016  . COLONOSCOPY    . HYSTEROSCOPY WITH D & C N/A 12/19/2015   Procedure: DILATATION AND CURETTAGE /HYSTEROSCOPY;  Surgeon: Linda Hedges, DO;  Location: [facility];  Service: Gynecology;  Laterality: N/A;  . POLYPECTOMY    . RADIOACTIVE SEED GUIDED PARTIAL MASTECTOMY WITH AXILLARY SENTINEL LYMPH NODE BIOPSY Left 04/17/2016   Procedure: RADIOACTIVE SEED GUIDED PARTIAL MASTECTOMY WITH AXILLARY SENTINEL LYMPH NODE BIOPSY;  Surgeon: Erroll Luna, MD;  Location: [facility];  Service: General;  Laterality: Left;  RADIOACTIVE SEED GUIDED PARTIAL MASTECTOMY WITH AXILLARY SENTINEL LYMPH NODE BIOPSY   . TUBAL LIGATION    . VAGINAL DELIVERY     x3   Social History:   reports that she quit smoking about 16 years ago. Her smoking use included cigarettes. She has  a 12.50 pack-year smoking history. She has never used smokeless tobacco. She reports current alcohol use. She reports that she does not use drugs.  Family History  Problem Relation Age of Onset  . Stroke Sister   . Hypertension Mother   . Stroke Mother   . Alcohol abuse Brother   . HIV/AIDS Brother   . Colon cancer Neg Hx   . Colon polyps Neg Hx   . Esophageal cancer Neg Hx   . Rectal cancer Neg Hx   . Stomach cancer  Neg Hx     Medications: Patient's Medications  New Prescriptions   No medications on file  Previous Medications   ALLOPURINOL (ZYLOPRIM) 100 MG TABLET    Take 1 tablet (100 mg total) by mouth daily.   ANASTROZOLE (ARIMIDEX) 1 MG TABLET    Take 1 tablet (1 mg total) by mouth daily.   ASPIRIN EC 81 MG TABLET    Take 1 tablet (81 mg total) by mouth daily.   ATORVASTATIN (LIPITOR) 20 MG TABLET    TAKE 1 TABLET EVERY DAY   ESZOPICLONE (LUNESTA) 2 MG TABS TABLET    Take 1 tablet (2 mg total) by mouth at bedtime as needed for sleep. Take immediately before bedtime   LABETALOL (NORMODYNE) 300 MG TABLET    TAKE 2 TABLETS THREE TIMES DAILY   NIFEDIPINE (PROCARDIA XL/NIFEDICAL XL) 60 MG 24 HR TABLET    Take 1 tablet (60 mg total) by mouth 2 (two) times daily.  Modified Medications   No medications on file  Discontinued Medications   No medications on file    Physical Exam:  Vitals:   04/18/21 1310  BP: 134/78  Pulse: 80  Temp: (!) 97.1 F (36.2 C)  TempSrc: Temporal  SpO2: 98%  Weight: 199 lb (90.3 kg)  Height: _0  (1.6 m)   Body mass index is 35.25 kg/m. Wt Readings from Last 3 Encounters:  04/18/21 199 lb (90.3 kg)  01/17/21 206 lb (93.4 kg)  01/09/21 200 lb (90.7 kg)    Physical Exam Constitutional:      General: She is not in acute distress.    Appearance: She is well-developed. She is not diaphoretic.  HENT:     Head: Normocephalic and atraumatic.     Mouth/Throat:     Pharynx: No oropharyngeal exudate.  Eyes:     Conjunctiva/sclera: Conjunctivae normal.     Pupils: Pupils are equal, round, and reactive to light.  Cardiovascular:     Rate and Rhythm: Normal rate and regular rhythm.     Heart sounds: Normal heart sounds.  Pulmonary:     Effort: Pulmonary effort is normal.     Breath sounds: Normal breath sounds.  Abdominal:     General: Bowel sounds are normal.     Palpations: Abdomen is soft.  Musculoskeletal:        General: No tenderness.     Cervical  back: Normal range of motion and neck supple.  Skin:    General: Skin is warm and dry.  Neurological:     Mental Status: She is alert and oriented to person, place, and time.    Labs reviewed: Basic Metabolic Panel: Recent Labs    05/13/20 0930 07/29/20 1655 11/18/20 0000  NA 146 146* 142  K 4.3 4.0 4.2  CL 109 106 106  CO2 _1 GLUCOSE 101* 107* 91  BUN _2 CREATININE 1.43* 1.79* 1.33*  CALCIUM 10.1 9.9 9.9  Liver Function Tests: Recent Labs    05/13/20 0930 11/18/20 0000  AST 19 25  ALT 20 20  BILITOT 0.5 0.6  PROT 7.0 7.3   No results for input(s): LIPASE, AMYLASE in the last 8760 hours. No results for input(s): AMMONIA in the last 8760 hours. CBC: Recent Labs    05/13/20 0930 11/18/20 0000  WBC 5.4 5.4  NEUTROABS 3,386 3,175  HGB 12.0 12.9  HCT 35.3 37.0  MCV 92.2 91.6  PLT 272 305   Lipid Panel: Recent Labs    05/13/20 0930  CHOL 137  HDL 70  LDLCALC 55  TRIG 50  CHOLHDL 2.0   TSH: No results for input(s): TSH in the last 8760 hours. A1C: Lab Results  Component Value Date   HGBA1C 5.8 (H) 05/13/2020     Assessment/Plan 1. Chronic gout of left wrist, unspecified cause -improved, continues allopurinol 100 mg daily with low purine diet.  - Uric Acid  2. Essential hypertension -controlled on current regimen. Also followed by cardiology. - CMP with eGFR([facility]) - CBC with Differential/Platelet  3. Hemiplegia following CVA (cerebrovascular accident) ([place]) -without worsening deficits. Continues on ASA 81 mg daily, statin with proper bp, lipid and blood sugar control  4. Malignant neoplasm of upper-outer quadrant of left breast in female, estrogen receptor positive ([place]) Continues to follow up with oncology   5. Primary insomnia -ongoing, continues on lunesta to help  6. Morbid obesity ([place]) Has had some weight loss since last visit.  -education provided on healthy weight loss through increase in physical activity and  proper nutrition   7. Multiple sclerosis ([name]) -stable, continue to follow up with neurology  8. Pure hypercholesterolemia -continues on statin with low fat diet recommended. - CMP with eGFR([facility]) - Lipid Panel  9. Need for vaccination with 13-polyvalent pneumococcal conjugate vaccine - Pneumococcal conjugate vaccine 13-valent  Next appt: 6 months.  Carlos American. [place], [facility] [phone]

## 2021-04-19 ENCOUNTER — Other Ambulatory Visit: Payer: Self-pay | Admitting: Nurse Practitioner

## 2021-04-19 DIAGNOSIS — F5101 Primary insomnia: Secondary | ICD-10-CM

## 2021-04-19 LAB — CBC WITH DIFFERENTIAL/PLATELET
Absolute Monocytes: 527 cells/uL (ref 200–950)
Basophils Absolute: 12 cells/uL (ref 0–200)
Basophils Relative: 0.3 %
Eosinophils Absolute: 0 cells/uL — ABNORMAL LOW (ref 15–500)
Eosinophils Relative: 0 %
HCT: 36.4 % (ref 35.0–45.0)
Hemoglobin: 12.3 g/dL (ref 11.7–15.5)
Lymphs Abs: 1182 cells/uL (ref 850–3900)
MCH: 31.1 pg (ref 27.0–33.0)
MCHC: 33.8 g/dL (ref 32.0–36.0)
MCV: 91.9 fL (ref 80.0–100.0)
MPV: 9.8 fL (ref 7.5–12.5)
Monocytes Relative: 13.5 %
Neutro Abs: 2180 cells/uL (ref 1500–7800)
Neutrophils Relative %: 55.9 %
Platelets: 262 10*3/uL (ref 140–400)
RBC: 3.96 10*6/uL (ref 3.80–5.10)
RDW: 12.8 % (ref 11.0–15.0)
Total Lymphocyte: 30.3 %
WBC: 3.9 10*3/uL (ref 3.8–10.8)

## 2021-04-19 LAB — COMPLETE METABOLIC PANEL WITH GFR
AG Ratio: 2 (calc) (ref 1.0–2.5)
ALT: 25 U/L (ref 6–29)
AST: 28 U/L (ref 10–35)
Albumin: 4.5 g/dL (ref 3.6–5.1)
Alkaline phosphatase (APISO): 87 U/L (ref 37–153)
BUN/Creatinine Ratio: 11 (calc) (ref 6–22)
BUN: 17 mg/dL (ref 7–25)
CO2: 22 mmol/L (ref 20–32)
Calcium: 9.6 mg/dL (ref 8.6–10.4)
Chloride: 105 mmol/L (ref 98–110)
Creat: 1.61 mg/dL — ABNORMAL HIGH (ref 0.50–0.99)
GFR, Est African American: 38 mL/min/{1.73_m2} — ABNORMAL LOW (ref >=60)
GFR, Est Non African American: 33 mL/min/{1.73_m2} — ABNORMAL LOW (ref >=60)
Globulin: 2.2 g/dL (calc) (ref 1.9–3.7)
Glucose, Bld: 96 mg/dL (ref 65–99)
Potassium: 4 mmol/L (ref 3.5–5.3)
Sodium: 140 mmol/L (ref 135–146)
Total Bilirubin: 0.5 mg/dL (ref 0.2–1.2)
Total Protein: 6.7 g/dL (ref 6.1–8.1)

## 2021-04-19 LAB — LIPID PANEL
Cholesterol: 139 mg/dL (ref <200)
HDL: 66 mg/dL (ref >=50)
LDL Cholesterol (Calc): 58 mg/dL (calc)
Non-HDL Cholesterol (Calc): 73 mg/dL (calc) (ref <130)
Total CHOL/HDL Ratio: 2.1 (calc) (ref <5.0)
Triglycerides: 73 mg/dL (ref <150)

## 2021-04-19 LAB — URIC ACID: Uric Acid, Serum: 6.7 mg/dL (ref 2.5–7.0)

## 2021-04-21 ENCOUNTER — Other Ambulatory Visit: Payer: Self-pay

## 2021-04-21 ENCOUNTER — Telehealth: Payer: Self-pay

## 2021-04-21 ENCOUNTER — Ambulatory Visit
Admission: RE | Admit: 2021-04-21 | Discharge: 2021-04-21 | Disposition: A | Discharge status: Home / Self Care | Payer: Medicare HMO | Source: Ambulatory Visit | Attending: Nurse Practitioner | Admitting: Nurse Practitioner

## 2021-04-21 DIAGNOSIS — E2839 Other primary ovarian failure: Secondary | ICD-10-CM

## 2021-04-21 DIAGNOSIS — Z780 Asymptomatic menopausal state: Secondary | ICD-10-CM | POA: Diagnosis not present

## 2021-04-21 MED ORDER — ALLOPURINOL 100 MG PO TABS
200.0000 mg | ORAL_TABLET | Freq: Every day | ORAL | 6 refills | Status: DC
Start: 2021-04-21 — End: 2021-05-12

## 2021-04-21 NOTE — Telephone Encounter (Signed)
-----   Message from [name], NP sent at 04/21/2021  9:03 AM EDT ----- Uric acid level still not at goal. Recommend following low purine diet and to increase allopurinol to 200 mg by mouth daily.  Kidney function slightly worse from previous, making sure she is staying properly hydration and to avoid NSAIDS (Aleve, Advil, Motrin, Ibuprofen). Otherwise Cholesterol, blood counts and electrolytes are at goal.

## 2021-04-21 NOTE — Telephone Encounter (Signed)
Discussed results with patient, patient verbalized understanding of results. Pharmacy confirmed as [facility] to send updated rx for allopurinol.

## 2021-04-25 DIAGNOSIS — Z20822 Contact with and (suspected) exposure to covid-19: Secondary | ICD-10-CM | POA: Diagnosis not present

## 2021-05-08 NOTE — Progress Notes (Signed)
Cardiology Clinic Note   Patient Name: Michele Page Date of Encounter: 05/12/2021  Primary Care Provider:  [name], NP Primary Cardiologist:  Quay Burow, MD  Patient Profile    Michele Page presents to the clinic today for follow-up evaluation of her accelerated hypertension and mixed hyperlipidemia.  Past Medical History    Past Medical History:  Diagnosis Date   Anemia    past hx many yrs ago    Arthritis    lt knee   Breast cancer ([place])    Breast cancer of upper-outer quadrant of left female breast ([place]) 04/02/2016   Chronic kidney disease    CKD   Colon polyps    COPD (chronic obstructive pulmonary disease) ([name])    GERD (gastroesophageal reflux disease)    years ago- not current    Hyperlipidemia    Hypertension    Multiple sclerosis ([place])    Personal history of chemotherapy    Personal history of radiation therapy    Stroke ([place])    2013, mild cognitive deficits   Vision abnormalities    Past Surgical History:  Procedure Laterality Date   BREAST BIOPSY Left 03/31/2016   BREAST LUMPECTOMY Left 04/17/2016   COLONOSCOPY     HYSTEROSCOPY WITH D & C N/A 12/19/2015   Procedure: DILATATION AND CURETTAGE /HYSTEROSCOPY;  Surgeon: Linda Hedges, DO;  Location: [facility];  Service: Gynecology;  Laterality: N/A;   POLYPECTOMY     RADIOACTIVE SEED GUIDED PARTIAL MASTECTOMY WITH AXILLARY SENTINEL LYMPH NODE BIOPSY Left 04/17/2016   Procedure: RADIOACTIVE SEED GUIDED PARTIAL MASTECTOMY WITH AXILLARY SENTINEL LYMPH NODE BIOPSY;  Surgeon: Erroll Luna, MD;  Location: [facility];  Service: General;  Laterality: Left;  RADIOACTIVE SEED GUIDED PARTIAL MASTECTOMY WITH AXILLARY SENTINEL LYMPH NODE BIOPSY    TUBAL LIGATION     VAGINAL DELIVERY     x3    Allergies  No Known Allergies  History of Present Illness    [name] has a PMH of hypertension, CVA, hypokalemia, and mixed hyperlipidemia.  Her CVA left her with some motor and cognitive  deficits.  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  She was last seen by Dr. Gwenlyn Found on 10/29/2020.  She underwent renal Dopplers on 08/17/2020 which showed no evidence of renal artery stenosis.  She was referred to clinical pharmacist for assistance with her antihypertensive medications.  She has been maintaining a blood pressure log.  Her blood pressure was fairly well controlled.  At the time of visit her blood pressure was 132/72.  She was continued on her nifedipine and labetalol.  She presents the clinic today for follow-up evaluation states she feels well.  She reports that she has noticed some intermittent high blood pressures as well as intermittent low blood pressures at home.  We reviewed her blood pressure medications and times in which she takes them.  She does seem somewhat confused in her explanations of when she takes her medications.  She reports that she does use a med minder to help with her medication dosing.  Given that her blood pressure is well controlled at this time I proposed that we give her a blood pressure log and continue to monitor blood pressures.  I will gave her the salty 6 diet sheet, asked her to increase her physical activity to 150 minutes in the course of a week and follow-up in 6 months.   Today she denies chest pain, shortness of breath, lower extremity edema,  fatigue, palpitations, melena, hematuria, hemoptysis, diaphoresis, weakness, presyncope, syncope, orthopnea, and PND.   Home Medications    Prior to Admission medications   Medication Sig Start Date End Date Taking? Authorizing Provider  allopurinol (ZYLOPRIM) 100 MG tablet Take 2 tablets (200 mg total) by mouth daily. 04/21/21   [name], NP  anastrozole (ARIMIDEX) 1 MG tablet Take 1 tablet (1 mg total) by mouth daily. 11/05/20   Nicholas Lose, MD  aspirin EC 81 MG tablet Take 1 tablet (81 mg total) by mouth daily. 03/01/15   Gildardo Cranker, DO  atorvastatin (LIPITOR) 20 MG tablet TAKE 1  TABLET EVERY DAY 01/07/21   [name], NP  eszopiclone (LUNESTA) 2 MG TABS tablet TAKE 1 TABLET BY MOUTH AT BEDTIME AS NEEDED FOR SLEEP(TAKE IMMEDIATELY BEFORE BEDTIME) 04/21/21   Gerlene Fee, NP  labetalol (NORMODYNE) 300 MG tablet TAKE 2 TABLETS THREE TIMES DAILY 12/16/20   [name], NP  NIFEdipine (PROCARDIA XL/NIFEDICAL XL) 60 MG 24 hr tablet Take 1 tablet (60 mg total) by mouth 2 (two) times daily. 01/23/21   Lorretta Harp, MD    Family History    Family History  Problem Relation Age of Onset   Stroke Sister    Hypertension Mother    Stroke Mother    Alcohol abuse Brother    HIV/AIDS Brother    Colon cancer Neg Hx    Colon polyps Neg Hx    Esophageal cancer Neg Hx    Rectal cancer Neg Hx    Stomach cancer Neg Hx    She indicated that her mother is deceased. She indicated that her father is deceased. She indicated that her sister is deceased. She indicated that all of her three brothers are deceased. She indicated that the status of her neg hx is unknown.  Social History    Social History   Socioeconomic History   Marital status: Married    Spouse name: Not on file   Number of children: 3   Years of education: Not on file   Highest education level: Not on file  Occupational History   Occupation: retired  Tobacco Use   Smoking status: Former    Packs/day: 0.50    Years: 25.00    Pack years: 12.50    Types: Cigarettes    Quit date: 11/16/2004    Years since quitting: 16.4   Smokeless tobacco: Never  Vaping Use   Vaping Use: Never used  Substance and Sexual Activity   Alcohol use: Yes    Alcohol/week: 0.0 standard drinks    Comment: occas   Drug use: No   Sexual activity: Yes    Comment: 1st intercourse 82 yo-5 partners  Other Topics Concern   Not on file  Social History Narrative   Diet- N/A   Caffeine- Yes   Married- Yes   House- 2 story with 2 people   Pets- No   Current/past profession- Insurance underwriter daycare   Exercise- Yes    Living will-No   DNR-N/A   POA/HPOA-No      Works part-time at [employer] Strain: Not on file  Food Insecurity: Not on file  Transportation Needs: Not on file  Physical Activity: Not on file  Stress: Not on file  Social Connections: Not on file  Intimate Partner Violence: Not on file     Review of Systems    General:  No chills, fever, night  sweats or weight changes.  Cardiovascular:  No chest pain, dyspnea on exertion, edema, orthopnea, palpitations, paroxysmal nocturnal dyspnea. Dermatological: No rash, lesions/masses Respiratory: No cough, dyspnea Urologic: No hematuria, dysuria Abdominal:   No nausea, vomiting, diarrhea, bright red blood per rectum, melena, or hematemesis Neurologic:  No visual changes, wkns, changes in mental status. All other systems reviewed and are otherwise negative except as noted above.  Physical Exam    VS:  BP 130/72   Pulse 72   Ht 5\' 2"  (1.575 m)   Wt 203 lb 6.4 oz (92.3 kg)   SpO2 96%   BMI 37.20 kg/m  , BMI Body mass index is 37.2 kg/m. GEN: Well nourished, well developed, in no acute distress. HEENT: normal. Neck: Supple, no JVD, carotid bruits, or masses. Cardiac: RRR, no murmurs, rubs, or gallops. No clubbing, cyanosis, edema.  Radials/DP/PT 2+ and equal bilaterally.  Respiratory:  Respirations regular and unlabored, clear to auscultation bilaterally. GI: Soft, nontender, nondistended, BS + x 4. MS: no deformity or atrophy. Skin: warm and dry, no rash. Neuro:  Strength and sensation are intact. Psych: Normal affect.  Accessory Clinical Findings    Recent Labs: 04/18/2021: ALT 25; BUN 17; Creat 1.61; Hemoglobin 12.3; Platelets 262; Potassium 4.0; Sodium 140   Recent Lipid Panel    Component Value Date/Time   CHOL 139 04/18/2021 1348   CHOL 196 05/13/2016 1127   TRIG 73 04/18/2021 1348   HDL 66 04/18/2021 1348   HDL 64 05/13/2016 1127   CHOLHDL 2.1 04/18/2021 1348   VLDL  22 05/26/2017 0932   LDLCALC 58 04/18/2021 1348    ECG personally reviewed by me today-normal sinus rhythm nonspecific T wave abnormality QT prolongation 72 bpm  EKG 07/29/2020 Normal sinus rhythm nonspecific T wave abnormality 72 bpm   Assessment & Plan   1.  Accelerated hypertension-BP today 130/72.  Reports labile blood pressures at home.  Does not bring in blood pressure log today. Continue Procardia 60 mg twice daily, labetalol 600 mg 3 times daily Heart healthy low-sodium diet-salty 6 given Increase physical activity as tolerated  Hyperlipidemia-04/18/2021: Cholesterol 139; HDL 66; LDL Cholesterol (Calc) 58; Triglycerides 73 Continue atorvastatin, aspirin Heart healthy low-sodium high-fiber diet.   Increase physical activity as tolerated    Disposition: Follow-up with Dr. Gwenlyn Found in 6 months.  Jossie Ng. Jaleah Lefevre NP-C    05/12/2021, 1:51 PM [facility] [place] [address] Office [phone] Fax [phone]  Notice: This dictation was prepared with Dragon dictation along with smaller phrase technology. Any transcriptional errors that result from this process are unintentional and may not be corrected upon review.  I spent 15 minutes examining this patient, reviewing medications, and using patient centered shared decision making involving her cardiac care.  Prior to her visit I spent greater than 20 minutes reviewing her past medical history,  medications, and prior cardiac tests.

## 2021-05-12 ENCOUNTER — Ambulatory Visit (INDEPENDENT_AMBULATORY_CARE_PROVIDER_SITE_OTHER): Payer: Medicare HMO | Admitting: General Practice

## 2021-05-12 ENCOUNTER — Other Ambulatory Visit: Payer: Self-pay

## 2021-05-12 ENCOUNTER — Encounter: Payer: Self-pay | Admitting: General Practice

## 2021-05-12 ENCOUNTER — Other Ambulatory Visit: Payer: Self-pay | Admitting: Nurse Practitioner

## 2021-05-12 VITALS — BP 130/72 | HR 72 | Ht 62.0 in | Wt 203.4 lb

## 2021-05-12 DIAGNOSIS — E782 Mixed hyperlipidemia: Secondary | ICD-10-CM

## 2021-05-12 DIAGNOSIS — I10 Essential (primary) hypertension: Secondary | ICD-10-CM | POA: Diagnosis not present

## 2021-05-12 NOTE — Patient Instructions (Signed)
Medication Instructions:  The current medical regimen is effective;  continue present plan and medications as directed. Please refer to the Current Medication list given to you today. *If you need a refill on your cardiac medications before your next appointment, please call your pharmacy*  Lab Work:   Testing/Procedures:  NONE    NONE  Special Instructions TAKE AND LOG YOUR BLOOD PRESSURE IN THE AM-AT LEAST 2 TIMES A WEEK AND IF YOU FEEL BAD  PLEASE READ AND FOLLOW SALTY 6-ATTACHED-1,800mg  daily  PLEASE INCREASE PHYSICAL ACTIVITY AS TOLERATED. YOUR GOAL IS 150 MINUTES OF MODERATE PHYSICAL ACTIVITY WEEKLY.  Follow-Up: Your next appointment:  6 month(s) In Person with Quay Burow, MD ONLY  At [facility], you and your health needs are our priority.  As part of our continuing mission to provide you with exceptional heart care, we have created designated Provider Care Teams.  These Care Teams include your primary Cardiologist (physician) and Advanced Practice Providers (APPs -  Physician Assistants and Nurse Practitioners) who all work together to provide you with the care you need, when you need it.  We recommend signing up for the patient portal called "MyChart".  Sign up information is provided on this After Visit Summary.  MyChart is used to connect with patients for Virtual Visits (Telemedicine).  Patients are able to view lab/test results, encounter notes, upcoming appointments, etc.  Non-urgent messages can be sent to your provider as well.   To learn more about what you can do with MyChart, go to [email or web address].              6 SALTY THINGS TO AVOID     1,800MG  DAILY

## 2021-06-02 ENCOUNTER — Other Ambulatory Visit: Payer: Self-pay | Admitting: Nurse Practitioner

## 2021-06-02 DIAGNOSIS — E7800 Pure hypercholesterolemia, unspecified: Secondary | ICD-10-CM

## 2021-06-03 NOTE — Progress Notes (Signed)
Patient Care Team: [name], NP as PCP - General (Geriatric Medicine) Lorretta Harp, MD as PCP - Cardiology (Cardiology) Nicholas Lose, MD as Consulting Physician (Oncology) Erroll Luna, MD as Consulting Physician (General Surgery) Thea Silversmith, MD as Consulting Physician (Radiation Oncology)  DIAGNOSIS:    ICD-10-CM   1. Malignant neoplasm of upper-outer quadrant of left breast in female, estrogen receptor positive ([place])  C50.412    Z17.0       SUMMARY OF ONCOLOGIC HISTORY: Oncology History  Breast cancer of upper-outer quadrant of left female breast ([place])  03/31/2016 Initial Diagnosis   Screening detected left breast mass, 2 nodules, 1.9 x 1.6 x 0.8 cm= fat necrosis; 8 x 7 x 7 mm= grade 2 IDC ER 100%, PR 100%, HER-2 negative ratio 1.39, Ki-67 20%    04/17/2016 Surgery   Left lumpectomy ([name]): IDC grade 2, 1.3 cm, with DCIS, margins negative, 0/4 lymph nodes negative, T1 cN0 stage IA pathologic stage, ER 100%, PR 100%, HER-2 negative ratio 1.39, Ki-67 20% Oncotype DX score 20, 13% ROR, intermediate risk   04/17/2016 Oncotype testing   Recurrence score: 20; ROR 15% (intermediate risk)     05/27/2016 - 07/13/2016 Radiation Therapy   Adjuvant radiation therapy [facility]): Left breast treated with breath hold to 50.4 Gy in 28 fractions at 1.8 Gy/fraction.  Left breast boosted to 10 Gy in 5 fractions at 2 Gy/fraction    07/23/2016 -  Anti-estrogen oral therapy   Anastrozole 1 mg switched to letrozole 04/22/2017 due to hair loss, switch to tamoxifen 10/22/2017 due to hair loss from letrozole as well      CHIEF COMPLIANT: Follow-up of left breast cancer on anastrozole  INTERVAL HISTORY: Michele Page is a 65 y.o. with above-mentioned history of left breast cancer treated with lumpectomy, radiation, and who is currently on anastrozole. Mammogram on 12/09/20 showed no evidence of malignancy bilaterally.  She came into the clinic today complaining of a palpable lump in  the right breast.  This is at the 2 o'clock position from the nipple subcutaneous nodule.   ALLERGIES:  has No Known Allergies.  MEDICATIONS:  Current Outpatient Medications  Medication Sig Dispense Refill   anastrozole (ARIMIDEX) 1 MG tablet Take 1 tablet (1 mg total) by mouth daily. 90 tablet 3   aspirin EC 81 MG tablet Take 1 tablet (81 mg total) by mouth daily. 30 tablet 6   atorvastatin (LIPITOR) 20 MG tablet TAKE 1 TABLET EVERY DAY 90 tablet 1   eszopiclone (LUNESTA) 2 MG TABS tablet TAKE 1 TABLET BY MOUTH AT BEDTIME AS NEEDED FOR SLEEP(TAKE IMMEDIATELY BEFORE BEDTIME) 15 tablet 0   labetalol (NORMODYNE) 300 MG tablet TAKE 2 TABLETS THREE TIMES DAILY 540 tablet 1   NIFEdipine (PROCARDIA XL/NIFEDICAL XL) 60 MG 24 hr tablet Take 1 tablet (60 mg total) by mouth 2 (two) times daily. 180 tablet 3   No current facility-administered medications for this visit.    PHYSICAL EXAMINATION: ECOG PERFORMANCE STATUS: 1 - Symptomatic but completely ambulatory  Vitals:   06/04/21 1242  BP: 134/82  Pulse: 87  Resp: 18  Temp: 97.7 F (36.5 C)  SpO2: 99%   Filed Weights   06/04/21 1242  Weight: 201 lb (91.2 kg)    LABORATORY DATA:  I have reviewed the data as listed CMP Latest Ref Rng & Units 04/18/2021 11/18/2020 07/29/2020  Glucose 65 - 99 mg/dL 96 91 107(H)  BUN 7 - 25 mg/dL _0 Creatinine 0.50 -  0.99 mg/dL 1.61(H) 1.33(H) 1.79(H)  Sodium 135 - 146 mmol/L 140 142 146(H)  Potassium 3.5 - 5.3 mmol/L 4.0 4.2 4.0  Chloride 98 - 110 mmol/L 105 106 106  CO2 20 - 32 mmol/L _0 Calcium 8.6 - 10.4 mg/dL 9.6 9.9 9.9  Total Protein 6.1 - 8.1 g/dL 6.7 7.3 -  Total Bilirubin 0.2 - 1.2 mg/dL 0.5 0.6 -  Alkaline Phos 33 - 130 U/L - - -  AST 10 - 35 U/L 28 25 -  ALT 6 - 29 U/L 25 20 -    Lab Results  Component Value Date   WBC 3.9 04/18/2021   HGB 12.3 04/18/2021   HCT 36.4 04/18/2021   MCV 91.9 04/18/2021   PLT 262 04/18/2021   NEUTROABS 2,180 04/18/2021    ASSESSMENT &  PLAN:  Breast cancer of upper-outer quadrant of left female breast ([place]) 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, 1.3 cm, with DCIS, margins negative, 0/4 lymph nodes negative, T1 cN0 stage IA pathologic stage, ER 100%, PR 100%, HER-2 negative ratio 1.39, Ki-67 20% (Originally 2 nodules were detected on screening mammogram 1.9 cm= fat necrosis and 8 mm IDC) Oncotype DX score 20, 13% risk of recurrence, intermediate risk Adjuvant radiation therapy started 07/20/2017completed 07/13/2016   Current treatment: Adjuvant antiestrogen therapy with anastrozole started 07/23/2016 but switched to letrozole in June 2018 due to hair loss, switched to tamoxifen 10/22/2017 due to continuing hair loss with letrozole, switched back to anastrozole   Anastrozole toxicities: Denies any hot flashes or myalgias. Duration of treatment:  7 years.    Hair loss: Continues to be a problem which she attributes to anastrozole.   Surveillance: 1. Breast exam 06/04/2021: On palpation of the right breast I can feel a small subcutaneous nodule but it does not appear to be concerning for recurrent breast cancer.  However given her anxiety about breast cancer recurrence, I have ordered a mammogram and ultrasound for further evaluation. 2. Mammogram : 12/09/20: Benign breast density category B Bone density 04/21/2021: T score -0.5: Normal  CT abdomen 04/20/2019: Diffuse colonic diverticulosis   Return to clinic at her regular annual follow-up in December      No orders of the defined types were placed in this encounter.  The patient has a good understanding of the overall plan. she agrees with it. she will call with any problems that may develop before the next visit here.  Total time spent: 20 mins including face to face time and time spent for planning, charting and coordination of care  Rulon Eisenmenger, MD, MPH 06/04/2021  I, Thana Ates, am acting as scribe for Dr. Nicholas Lose.  I have reviewed the above  documentation for accuracy and completeness, and I agree with the above.

## 2021-06-04 ENCOUNTER — Inpatient Hospital Stay: Payer: Medicare HMO | Attending: Hematology and Oncology | Admitting: Hematology and Oncology

## 2021-06-04 ENCOUNTER — Other Ambulatory Visit: Payer: Self-pay

## 2021-06-04 DIAGNOSIS — Z170 Estrogen receptor positive status [ER+]: Secondary | ICD-10-CM | POA: Diagnosis not present

## 2021-06-04 DIAGNOSIS — Z79899 Other long term (current) drug therapy: Secondary | ICD-10-CM | POA: Diagnosis not present

## 2021-06-04 DIAGNOSIS — Z79811 Long term (current) use of aromatase inhibitors: Secondary | ICD-10-CM | POA: Insufficient documentation

## 2021-06-04 DIAGNOSIS — C50412 Malignant neoplasm of upper-outer quadrant of left female breast: Secondary | ICD-10-CM | POA: Insufficient documentation

## 2021-06-04 DIAGNOSIS — Z923 Personal history of irradiation: Secondary | ICD-10-CM | POA: Insufficient documentation

## 2021-06-04 NOTE — Assessment & Plan Note (Signed)
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, 1.3 cm, with DCIS, margins negative, 0/4 lymph nodes negative, T1 cN0 stage IA pathologic stage, ER 100%, PR 100%, HER-2 negative ratio 1.39, Ki-67 20% (Originally 2 nodules were detected on screening mammogram 1.9 cm= fat necrosis and 8 mm IDC) Oncotype DX score 20, 13% risk of recurrence, intermediate risk Adjuvant radiation therapy started 07/20/2017completed 07/13/2016  Current treatment: Adjuvant antiestrogen therapy withanastrozole started 07/23/2016 but switched toletrozolein June 2018 due to hair loss, switched to tamoxifen 10/22/2017 due to continuing hair loss with letrozole, switched back to anastrozole  Anastrozoletoxicities: Denies any hot flashes or myalgias. Duration of treatment:  7 years.   Hair loss: Continues to be a problem which she attributes to anastrozole.  Surveillance: 1. Breast exam7/20/2022: Benign 2. Mammogram:12/09/20: Benign breast density category B Bone density 04/21/2021: T score -0.5: Normal  CT abdomen 04/20/2019: Diffuse colonic diverticulosis  Return to clinic in 1 year for follow-up  

## 2021-06-11 ENCOUNTER — Other Ambulatory Visit: Payer: Self-pay | Admitting: Nurse Practitioner

## 2021-06-11 DIAGNOSIS — I10 Essential (primary) hypertension: Secondary | ICD-10-CM

## 2021-07-09 ENCOUNTER — Other Ambulatory Visit: Payer: Medicare HMO

## 2021-07-10 ENCOUNTER — Other Ambulatory Visit: Payer: Self-pay

## 2021-07-10 ENCOUNTER — Ambulatory Visit
Admission: RE | Admit: 2021-07-10 | Discharge: 2021-07-10 | Disposition: A | Discharge status: Home / Self Care | Payer: Medicare HMO | Source: Ambulatory Visit | Attending: Hematology and Oncology | Admitting: Hematology and Oncology

## 2021-07-10 ENCOUNTER — Other Ambulatory Visit: Payer: Self-pay | Admitting: Hematology and Oncology

## 2021-07-10 DIAGNOSIS — Z853 Personal history of malignant neoplasm of breast: Secondary | ICD-10-CM | POA: Diagnosis not present

## 2021-07-10 DIAGNOSIS — C50412 Malignant neoplasm of upper-outer quadrant of left female breast: Secondary | ICD-10-CM

## 2021-07-10 DIAGNOSIS — Z170 Estrogen receptor positive status [ER+]: Secondary | ICD-10-CM

## 2021-07-10 DIAGNOSIS — R922 Inconclusive mammogram: Secondary | ICD-10-CM | POA: Diagnosis not present

## 2021-07-16 ENCOUNTER — Other Ambulatory Visit: Payer: Self-pay | Admitting: *Deleted

## 2021-07-16 DIAGNOSIS — F5101 Primary insomnia: Secondary | ICD-10-CM

## 2021-07-16 MED ORDER — ESZOPICLONE 2 MG PO TABS: ORAL_TABLET | ORAL | 0 refills | Status: DC

## 2021-07-16 NOTE — Telephone Encounter (Signed)
Patient requested refill.  Epic LR: 04/21/2021 Pended Rx and sent to [facility] for approval.

## 2021-07-24 ENCOUNTER — Other Ambulatory Visit: Payer: Self-pay | Admitting: Hematology and Oncology

## 2021-07-24 ENCOUNTER — Ambulatory Visit
Admission: RE | Admit: 2021-07-24 | Discharge: 2021-07-24 | Disposition: A | Discharge status: Home / Self Care | Payer: Medicare HMO | Source: Ambulatory Visit | Attending: Hematology and Oncology | Admitting: Hematology and Oncology

## 2021-07-24 ENCOUNTER — Other Ambulatory Visit: Payer: Self-pay

## 2021-07-24 DIAGNOSIS — C50412 Malignant neoplasm of upper-outer quadrant of left female breast: Secondary | ICD-10-CM

## 2021-07-24 DIAGNOSIS — Z170 Estrogen receptor positive status [ER+]: Secondary | ICD-10-CM

## 2021-07-24 DIAGNOSIS — N6311 Unspecified lump in the right breast, upper outer quadrant: Secondary | ICD-10-CM | POA: Diagnosis not present

## 2021-07-24 DIAGNOSIS — C50411 Malignant neoplasm of upper-outer quadrant of right female breast: Secondary | ICD-10-CM | POA: Diagnosis not present

## 2021-07-24 HISTORY — PX: BREAST BIOPSY: SHX20

## 2021-07-25 ENCOUNTER — Other Ambulatory Visit: Payer: Self-pay | Admitting: Hematology and Oncology

## 2021-07-25 ENCOUNTER — Telehealth: Payer: Self-pay | Admitting: Hematology and Oncology

## 2021-07-25 DIAGNOSIS — Z853 Personal history of malignant neoplasm of breast: Secondary | ICD-10-CM

## 2021-07-25 NOTE — Telephone Encounter (Signed)
Scheduled per sch msg. Called and spoke with patient. Confirmed appt  

## 2021-07-29 ENCOUNTER — Ambulatory Visit: Payer: Self-pay | Admitting: Surgery

## 2021-07-29 DIAGNOSIS — Z170 Estrogen receptor positive status [ER+]: Secondary | ICD-10-CM | POA: Diagnosis not present

## 2021-07-29 DIAGNOSIS — C50411 Malignant neoplasm of upper-outer quadrant of right female breast: Secondary | ICD-10-CM | POA: Diagnosis not present

## 2021-07-29 DIAGNOSIS — C50911 Malignant neoplasm of unspecified site of right female breast: Secondary | ICD-10-CM

## 2021-07-30 ENCOUNTER — Other Ambulatory Visit: Payer: Self-pay

## 2021-07-30 ENCOUNTER — Ambulatory Visit: Payer: Medicare HMO

## 2021-07-30 ENCOUNTER — Ambulatory Visit
Admission: RE | Admit: 2021-07-30 | Discharge: 2021-07-30 | Disposition: A | Discharge status: Home / Self Care | Payer: Medicare HMO | Source: Ambulatory Visit | Attending: Hematology and Oncology | Admitting: Hematology and Oncology

## 2021-07-30 DIAGNOSIS — R922 Inconclusive mammogram: Secondary | ICD-10-CM | POA: Diagnosis not present

## 2021-07-30 DIAGNOSIS — Z853 Personal history of malignant neoplasm of breast: Secondary | ICD-10-CM

## 2021-08-02 NOTE — Progress Notes (Signed)
Patient Care Team: [name], NP as PCP - General (Geriatric Medicine) Lorretta Harp, MD as PCP - Cardiology (Cardiology) Nicholas Lose, MD as Consulting Physician (Oncology) Erroll Luna, MD as Consulting Physician (General Surgery) Thea Silversmith, MD as Consulting Physician (Radiation Oncology)  DIAGNOSIS:    ICD-10-CM   1. Malignant neoplasm of upper-outer quadrant of left breast in female, estrogen receptor positive ([place])  C50.412 CT CHEST ABDOMEN PELVIS W CONTRAST   Z17.0     2. Malignant neoplasm of upper-outer quadrant of right breast in female, estrogen receptor positive ([place])  C50.411 CT CHEST ABDOMEN PELVIS W CONTRAST   Z17.0       SUMMARY OF ONCOLOGIC HISTORY: Oncology History  Breast cancer of upper-outer quadrant of left female breast ([place])  03/31/2016 Initial Diagnosis   Screening detected left breast mass, 2 nodules, 1.9 x 1.6 x 0.8 cm= fat necrosis; 8 x 7 x 7 mm= grade 2 IDC ER 100%, PR 100%, HER-2 negative ratio 1.39, Ki-67 20%   04/17/2016 Surgery   Left lumpectomy ([name]): IDC grade 2, 1.3 cm, with DCIS, margins negative, 0/4 lymph nodes negative, T1 cN0 stage IA pathologic stage, ER 100%, PR 100%, HER-2 negative ratio 1.39, Ki-67 20% Oncotype DX score 20, 13% ROR, intermediate risk   04/17/2016 Oncotype testing   Recurrence score: 20; ROR 15% (intermediate risk)    05/27/2016 - 07/13/2016 Radiation Therapy   Adjuvant radiation therapy [facility]): Left breast treated with breath hold to 50.4 Gy in 28 fractions at 1.8 Gy/fraction.  Left breast boosted to 10 Gy in 5 fractions at 2 Gy/fraction   07/23/2016 -  Anti-estrogen oral therapy   Anastrozole 1 mg switched to letrozole 04/22/2017 due to hair loss, switch to tamoxifen 10/22/2017 due to hair loss from letrozole as well   Malignant neoplasm of upper-outer quadrant of right breast in female, estrogen receptor positive ([place])  08/04/2021 Initial Diagnosis   Malignant neoplasm of upper-outer quadrant of  right breast in female, estrogen receptor positive ([name])    Relapse/Recurrence   2.2 cm ILC ER 100%, PR 90%, Her 2 Neg, Ki 67: 25%, Size 2.5 cm   08/04/2021 Cancer Staging   Staging form: Breast, AJCC 8th Edition - Clinical: Stage IB (cT2, cN0, cM0, G2, ER+, PR+, HER2-) - Signed by Nicholas Lose, MD on 08/04/2021 Stage prefix: Initial diagnosis Histologic grading system: 3 grade system     CHIEF COMPLIANT: Follow-up of left breast cancer on anastrozole  INTERVAL HISTORY: Michele Page is a 65 y.o. with above-mentioned history of left breast cancer treated with lumpectomy, radiation, and who is currently on anastrozole. Mammogram on 07/30/2021 showed no evidence of malignancy bilaterally. She presents to the clinic today for follow-up.  She is very stressed out about the new diagnosis in the contralateral breast.  ALLERGIES:  has No Known Allergies.  MEDICATIONS:  Current Outpatient Medications  Medication Sig Dispense Refill   anastrozole (ARIMIDEX) 1 MG tablet Take 1 tablet (1 mg total) by mouth daily. 90 tablet 3   aspirin EC 81 MG tablet Take 1 tablet (81 mg total) by mouth daily. 30 tablet 6   atorvastatin (LIPITOR) 20 MG tablet TAKE 1 TABLET EVERY DAY 90 tablet 1   eszopiclone (LUNESTA) 2 MG TABS tablet Take one tablet by mouth at bedtime as needed for sleep (take immediately before bedtime) 30 tablet 0   labetalol (NORMODYNE) 300 MG tablet TAKE 2 TABLETS THREE TIMES DAILY 540 tablet 1   NIFEdipine (PROCARDIA XL/NIFEDICAL XL) 60  MG 24 hr tablet Take 1 tablet (60 mg total) by mouth 2 (two) times daily. 180 tablet 3   No current facility-administered medications for this visit.    PHYSICAL EXAMINATION: ECOG PERFORMANCE STATUS: 1 - Symptomatic but completely ambulatory  Vitals:   08/04/21 1031  BP: (!) 115/57  Pulse: 87  Resp: 18  Temp: (!) 97.5 F (36.4 C)  SpO2: 99%   Filed Weights   08/04/21 1031  Weight: 201 lb (91.2 kg)      LABORATORY DATA:  I have reviewed the  data as listed CMP Latest Ref Rng & Units 04/18/2021 11/18/2020 07/29/2020  Glucose 65 - 99 mg/dL 96 91 107(H)  BUN 7 - 25 mg/dL _0 Creatinine 0.50 - 0.99 mg/dL 1.61(H) 1.33(H) 1.79(H)  Sodium 135 - 146 mmol/L 140 142 146(H)  Potassium 3.5 - 5.3 mmol/L 4.0 4.2 4.0  Chloride 98 - 110 mmol/L 105 106 106  CO2 20 - 32 mmol/L _1 Calcium 8.6 - 10.4 mg/dL 9.6 9.9 9.9  Total Protein 6.1 - 8.1 g/dL 6.7 7.3 -  Total Bilirubin 0.2 - 1.2 mg/dL 0.5 0.6 -  Alkaline Phos 33 - 130 U/L - - -  AST 10 - 35 U/L 28 25 -  ALT 6 - 29 U/L 25 20 -    Lab Results  Component Value Date   WBC 3.9 04/18/2021   HGB 12.3 04/18/2021   HCT 36.4 04/18/2021   MCV 91.9 04/18/2021   PLT 262 04/18/2021   NEUTROABS 2,180 04/18/2021    ASSESSMENT & PLAN:  Breast cancer of upper-outer quadrant of left female breast ([place]) 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, 1.3 cm, with DCIS, margins negative, 0/4 lymph nodes negative, T1 cN0 stage IA pathologic stage, ER 100%, PR 100%, HER-2 negative ratio 1.39, Ki-67 20% (Originally 2 nodules were detected on screening mammogram 1.9 cm= fat necrosis and 8 mm IDC) Oncotype DX score 20, 13% risk of recurrence, intermediate risk Adjuvant radiation therapy started 07/20/2017completed 07/13/2016   Current treatment: Adjuvant antiestrogen therapy with anastrozole started 07/23/2016 but switched to letrozole in June 2018 due to hair loss, switched to tamoxifen 10/22/2017 due to continuing hair loss with letrozole, switched back to anastrozole    Hair loss: Continues to be a problem which she attributes to anastrozole.   Relapse: 1. Breast exam 06/04/2021:  Palpable mass 2. Mammogram and ultrasound 07/24/2021: Ultrasound-guided biopsy of 2.3 cm mass at 11 o'clock position right breast: Grade 2 invasive lobular cancer ER 100%, PR 90%, Ki-67 25%, HER2 negative Bone density 04/21/2021: T score -0.5: Normal   Patient has seen Dr. Brantley Stage 07/29/2021 Breast MRI and surgery followed by  adjuvant antiestrogen therapy.  We discussed about switching her from anastrozole to letrozole. I will obtain CT chest abdomen pelvis for staging. She will need to see genetics.  Return to clinic after surgery to discuss pathology report. I discussed with her about doing Oncotype DX testing to determine risk of recurrence and the need for chemotherapy. For antiestrogen therapy we might end up doing exemestane. For surveillance we may do breast MRIs alternating with mammograms.   Orders Placed This Encounter  Procedures   CT CHEST ABDOMEN PELVIS W CONTRAST    Standing Status:   Future    Standing Expiration Date:   08/04/2022    Order Specific Question:   Preferred imaging location?    Answer:   [facility]    Order Specific Question:   Release to  patient    Answer:   Immediate    Order Specific Question:   Is Oral Contrast requested for this exam?    Answer:   Yes, Per Radiology protocol    Order Specific Question:   Reason for Exam (SYMPTOM  OR DIAGNOSIS REQUIRED)    Answer:   Recurrent breast cancer restaging    The patient has a good understanding of the overall plan. she agrees with it. she will call with any problems that may develop before the next visit here.  Total time spent: 30 mins including face to face time and time spent for planning, charting and coordination of care  Rulon Eisenmenger, MD, MPH 08/04/2021  I, Thana Ates, am acting as scribe for Dr. Nicholas Lose.  I have reviewed the above documentation for accuracy and completeness, and I agree with the above.

## 2021-08-04 ENCOUNTER — Encounter: Payer: Self-pay | Admitting: *Deleted

## 2021-08-04 ENCOUNTER — Inpatient Hospital Stay: Payer: Medicare HMO | Attending: Hematology and Oncology | Admitting: Hematology and Oncology

## 2021-08-04 ENCOUNTER — Other Ambulatory Visit: Payer: Self-pay | Admitting: *Deleted

## 2021-08-04 ENCOUNTER — Other Ambulatory Visit: Payer: Self-pay

## 2021-08-04 DIAGNOSIS — Z170 Estrogen receptor positive status [ER+]: Secondary | ICD-10-CM

## 2021-08-04 DIAGNOSIS — C50411 Malignant neoplasm of upper-outer quadrant of right female breast: Secondary | ICD-10-CM | POA: Insufficient documentation

## 2021-08-04 DIAGNOSIS — Z79811 Long term (current) use of aromatase inhibitors: Secondary | ICD-10-CM | POA: Insufficient documentation

## 2021-08-04 DIAGNOSIS — C50412 Malignant neoplasm of upper-outer quadrant of left female breast: Secondary | ICD-10-CM | POA: Insufficient documentation

## 2021-08-04 DIAGNOSIS — Z923 Personal history of irradiation: Secondary | ICD-10-CM | POA: Insufficient documentation

## 2021-08-04 NOTE — Assessment & Plan Note (Signed)
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, 1.3 cm, with DCIS, margins negative, 0/4 lymph nodes negative, T1 cN0 stage IA pathologic stage, ER 100%, PR 100%, HER-2 negative ratio 1.39, Ki-67 20% (Originally 2 nodules were detected on screening mammogram 1.9 cm= fat necrosis and 8 mm IDC) Oncotype DX score 20, 13% risk of recurrence, intermediate risk Adjuvant radiation therapy started 07/20/2017completed 07/13/2016  Current treatment: Adjuvant antiestrogen therapy withanastrozole started 07/23/2016 but switched toletrozolein June 2018 due to hair loss, switched to tamoxifen 10/22/2017 due to continuing hair loss with letrozole, switched back to anastrozole   Hair loss:Continues to be a problem which she attributes to anastrozole.  Relapse: 1. Breast exam7/20/2022:  Palpable mass 2. Mammogram and ultrasound 07/24/2021: Ultrasound-guided biopsy of 2.3 cm mass at 11 o'clock position right breast: Grade 2 invasive lobular cancer ER 100%, PR 90%, Ki-67 25%, HER2 negative Bone density 04/21/2021: T score -0.5: Normal  Patient has seen Dr. [name] 07/29/2021 Breast MRI and surgery followed by adjuvant antiestrogen therapy.  We discussed about switching her from anastrozole to letrozole. 

## 2021-08-05 ENCOUNTER — Other Ambulatory Visit: Payer: Self-pay | Admitting: *Deleted

## 2021-08-05 ENCOUNTER — Encounter: Payer: Self-pay | Admitting: *Deleted

## 2021-08-05 DIAGNOSIS — C50411 Malignant neoplasm of upper-outer quadrant of right female breast: Secondary | ICD-10-CM

## 2021-08-06 ENCOUNTER — Ambulatory Visit
Admission: RE | Admit: 2021-08-06 | Discharge: 2021-08-06 | Disposition: A | Discharge status: Home / Self Care | Payer: Medicare HMO | Source: Ambulatory Visit | Attending: Hematology and Oncology | Admitting: Hematology and Oncology

## 2021-08-06 ENCOUNTER — Other Ambulatory Visit: Payer: Self-pay

## 2021-08-06 DIAGNOSIS — N6311 Unspecified lump in the right breast, upper outer quadrant: Secondary | ICD-10-CM | POA: Diagnosis not present

## 2021-08-06 DIAGNOSIS — C50411 Malignant neoplasm of upper-outer quadrant of right female breast: Secondary | ICD-10-CM

## 2021-08-06 DIAGNOSIS — C50911 Malignant neoplasm of unspecified site of right female breast: Secondary | ICD-10-CM | POA: Diagnosis not present

## 2021-08-06 DIAGNOSIS — Z9889 Other specified postprocedural states: Secondary | ICD-10-CM | POA: Diagnosis not present

## 2021-08-06 MED ORDER — GADOBUTROL 1 MMOL/ML IV SOLN
9.0000 mL | Freq: Once | INTRAVENOUS | Status: AC | PRN
  Administered 2021-08-06: 9 mL via INTRAVENOUS

## 2021-08-07 ENCOUNTER — Other Ambulatory Visit: Payer: Self-pay | Admitting: Surgery

## 2021-08-07 ENCOUNTER — Encounter: Payer: Self-pay | Admitting: *Deleted

## 2021-08-07 DIAGNOSIS — C50911 Malignant neoplasm of unspecified site of right female breast: Secondary | ICD-10-CM

## 2021-08-09 ENCOUNTER — Other Ambulatory Visit: Payer: Medicare HMO

## 2021-08-12 ENCOUNTER — Other Ambulatory Visit: Payer: Self-pay

## 2021-08-12 ENCOUNTER — Inpatient Hospital Stay: Payer: Medicare HMO

## 2021-08-12 ENCOUNTER — Ambulatory Visit (HOSPITAL_COMMUNITY): Payer: Medicare HMO

## 2021-08-12 DIAGNOSIS — Z79811 Long term (current) use of aromatase inhibitors: Secondary | ICD-10-CM | POA: Diagnosis not present

## 2021-08-12 DIAGNOSIS — Z170 Estrogen receptor positive status [ER+]: Secondary | ICD-10-CM | POA: Diagnosis not present

## 2021-08-12 DIAGNOSIS — C50411 Malignant neoplasm of upper-outer quadrant of right female breast: Secondary | ICD-10-CM | POA: Diagnosis not present

## 2021-08-12 DIAGNOSIS — Z923 Personal history of irradiation: Secondary | ICD-10-CM | POA: Diagnosis not present

## 2021-08-12 DIAGNOSIS — C50412 Malignant neoplasm of upper-outer quadrant of left female breast: Secondary | ICD-10-CM | POA: Diagnosis not present

## 2021-08-12 LAB — CMP (CANCER CENTER ONLY)
ALT: 17 U/L (ref 0–44)
AST: 23 U/L (ref 15–41)
Albumin: 4.3 g/dL (ref 3.5–5.0)
Alkaline Phosphatase: 109 U/L (ref 38–126)
Anion gap: 13 (ref 5–15)
BUN: 27 mg/dL — ABNORMAL HIGH (ref 8–23)
CO2: 22 mmol/L (ref 22–32)
Calcium: 9.8 mg/dL (ref 8.9–10.3)
Chloride: 109 mmol/L (ref 98–111)
Creatinine: 2.07 mg/dL — ABNORMAL HIGH (ref 0.44–1.00)
GFR, Estimated: 26 mL/min — ABNORMAL LOW (ref >60)
Glucose, Bld: 126 mg/dL — ABNORMAL HIGH (ref 70–99)
Potassium: 3.6 mmol/L (ref 3.5–5.1)
Sodium: 144 mmol/L (ref 135–145)
Total Bilirubin: 0.5 mg/dL (ref 0.3–1.2)
Total Protein: 7.2 g/dL (ref 6.5–8.1)

## 2021-08-12 LAB — CBC WITH DIFFERENTIAL (CANCER CENTER ONLY)
Abs Immature Granulocytes: 0.01 10*3/uL (ref 0.00–0.07)
Basophils Absolute: 0 10*3/uL (ref 0.0–0.1)
Basophils Relative: 1 %
Eosinophils Absolute: 0.1 10*3/uL (ref 0.0–0.5)
Eosinophils Relative: 2 %
HCT: 33.1 % — ABNORMAL LOW (ref 36.0–46.0)
Hemoglobin: 11.5 g/dL — ABNORMAL LOW (ref 12.0–15.0)
Immature Granulocytes: 0 %
Lymphocytes Relative: 29 %
Lymphs Abs: 1.6 10*3/uL (ref 0.7–4.0)
MCH: 31.3 pg (ref 26.0–34.0)
MCHC: 34.7 g/dL (ref 30.0–36.0)
MCV: 90.2 fL (ref 80.0–100.0)
Monocytes Absolute: 0.4 10*3/uL (ref 0.1–1.0)
Monocytes Relative: 7 %
Neutro Abs: 3.2 10*3/uL (ref 1.7–7.7)
Neutrophils Relative %: 61 %
Platelet Count: 286 10*3/uL (ref 150–400)
RBC: 3.67 MIL/uL — ABNORMAL LOW (ref 3.87–5.11)
RDW: 13.3 % (ref 11.5–15.5)
WBC Count: 5.3 10*3/uL (ref 4.0–10.5)
nRBC: 0 % (ref 0.0–0.2)

## 2021-08-13 ENCOUNTER — Encounter: Payer: Self-pay | Admitting: *Deleted

## 2021-08-13 ENCOUNTER — Other Ambulatory Visit: Payer: Self-pay | Admitting: Surgery

## 2021-08-13 ENCOUNTER — Telehealth: Payer: Self-pay | Admitting: Hematology and Oncology

## 2021-08-13 DIAGNOSIS — R928 Other abnormal and inconclusive findings on diagnostic imaging of breast: Secondary | ICD-10-CM

## 2021-08-13 NOTE — Telephone Encounter (Signed)
Scheduled per sch msg. Called and spoke with patient. Confirmed appt  

## 2021-08-16 ENCOUNTER — Other Ambulatory Visit: Payer: Medicare HMO

## 2021-08-19 ENCOUNTER — Other Ambulatory Visit: Payer: Self-pay | Admitting: *Deleted

## 2021-08-19 ENCOUNTER — Ambulatory Visit (HOSPITAL_COMMUNITY): Admission: RE | Admit: 2021-08-19 | Payer: Medicare HMO | Source: Ambulatory Visit

## 2021-08-19 DIAGNOSIS — C50411 Malignant neoplasm of upper-outer quadrant of right female breast: Secondary | ICD-10-CM

## 2021-08-19 DIAGNOSIS — Z170 Estrogen receptor positive status [ER+]: Secondary | ICD-10-CM

## 2021-08-19 NOTE — Progress Notes (Signed)
RN alerted by CT department due to put kidney function CT CAP will need to be without contrast.  MD notified and verbal orders received for CT CAP without contrast.

## 2021-08-20 ENCOUNTER — Ambulatory Visit (HOSPITAL_COMMUNITY)
Admission: RE | Admit: 2021-08-20 | Discharge: 2021-08-20 | Disposition: A | Discharge status: Home / Self Care | Payer: Medicare HMO | Source: Ambulatory Visit | Attending: Hematology and Oncology | Admitting: Hematology and Oncology

## 2021-08-20 ENCOUNTER — Other Ambulatory Visit: Payer: Self-pay

## 2021-08-20 ENCOUNTER — Encounter (HOSPITAL_COMMUNITY): Payer: Self-pay

## 2021-08-20 DIAGNOSIS — Z170 Estrogen receptor positive status [ER+]: Secondary | ICD-10-CM | POA: Insufficient documentation

## 2021-08-20 DIAGNOSIS — I700 Atherosclerosis of aorta: Secondary | ICD-10-CM | POA: Diagnosis not present

## 2021-08-20 DIAGNOSIS — I2510 Atherosclerotic heart disease of native coronary artery without angina pectoris: Secondary | ICD-10-CM | POA: Diagnosis not present

## 2021-08-20 DIAGNOSIS — C50411 Malignant neoplasm of upper-outer quadrant of right female breast: Secondary | ICD-10-CM | POA: Diagnosis not present

## 2021-08-20 DIAGNOSIS — D3501 Benign neoplasm of right adrenal gland: Secondary | ICD-10-CM | POA: Diagnosis not present

## 2021-08-20 DIAGNOSIS — C50911 Malignant neoplasm of unspecified site of right female breast: Secondary | ICD-10-CM | POA: Diagnosis not present

## 2021-08-20 DIAGNOSIS — N6310 Unspecified lump in the right breast, unspecified quadrant: Secondary | ICD-10-CM | POA: Diagnosis not present

## 2021-08-20 DIAGNOSIS — K5730 Diverticulosis of large intestine without perforation or abscess without bleeding: Secondary | ICD-10-CM | POA: Diagnosis not present

## 2021-08-21 ENCOUNTER — Encounter: Payer: Self-pay | Admitting: *Deleted

## 2021-08-22 ENCOUNTER — Encounter: Payer: Self-pay | Admitting: *Deleted

## 2021-08-26 ENCOUNTER — Encounter (HOSPITAL_BASED_OUTPATIENT_CLINIC_OR_DEPARTMENT_OTHER): Payer: Self-pay | Admitting: Surgery

## 2021-08-26 ENCOUNTER — Other Ambulatory Visit: Payer: Self-pay

## 2021-08-28 ENCOUNTER — Telehealth: Payer: Self-pay

## 2021-08-28 NOTE — Telephone Encounter (Signed)
Patient called to make you aware that she will be having surgery 10/18 with Dr. Beryle Lathe.

## 2021-08-29 NOTE — Telephone Encounter (Signed)
Noted, will be on the look out for notes from doctor.

## 2021-09-01 ENCOUNTER — Ambulatory Visit
Admission: RE | Admit: 2021-09-01 | Discharge: 2021-09-01 | Disposition: A | Discharge status: Home / Self Care | Payer: Medicare HMO | Source: Ambulatory Visit | Attending: Surgery | Admitting: Surgery

## 2021-09-01 ENCOUNTER — Other Ambulatory Visit: Payer: Self-pay

## 2021-09-01 DIAGNOSIS — R928 Other abnormal and inconclusive findings on diagnostic imaging of breast: Secondary | ICD-10-CM | POA: Diagnosis not present

## 2021-09-01 DIAGNOSIS — C50911 Malignant neoplasm of unspecified site of right female breast: Secondary | ICD-10-CM

## 2021-09-01 NOTE — Progress Notes (Signed)

## 2021-09-02 ENCOUNTER — Ambulatory Visit (HOSPITAL_BASED_OUTPATIENT_CLINIC_OR_DEPARTMENT_OTHER): Payer: Medicare HMO | Admitting: Anesthesiology

## 2021-09-02 ENCOUNTER — Encounter (HOSPITAL_BASED_OUTPATIENT_CLINIC_OR_DEPARTMENT_OTHER): Payer: Self-pay | Admitting: Surgery

## 2021-09-02 ENCOUNTER — Other Ambulatory Visit: Payer: Self-pay

## 2021-09-02 ENCOUNTER — Encounter (HOSPITAL_BASED_OUTPATIENT_CLINIC_OR_DEPARTMENT_OTHER)
Admission: RE | Disposition: A | Discharge status: Home / Self Care | Payer: Self-pay | Source: Ambulatory Visit | Attending: Surgery

## 2021-09-02 ENCOUNTER — Ambulatory Visit (HOSPITAL_BASED_OUTPATIENT_CLINIC_OR_DEPARTMENT_OTHER)
Admission: RE | Admit: 2021-09-02 | Discharge: 2021-09-02 | Disposition: A | Discharge status: Home / Self Care | Payer: Medicare HMO | Source: Ambulatory Visit | Attending: Surgery | Admitting: Surgery

## 2021-09-02 ENCOUNTER — Ambulatory Visit (HOSPITAL_COMMUNITY)
Admission: RE | Admit: 2021-09-02 | Discharge: 2021-09-02 | Disposition: A | Discharge status: Home / Self Care | Payer: Medicare HMO | Source: Ambulatory Visit | Attending: Surgery | Admitting: Surgery

## 2021-09-02 ENCOUNTER — Ambulatory Visit
Admission: RE | Admit: 2021-09-02 | Discharge: 2021-09-02 | Disposition: A | Discharge status: Home / Self Care | Payer: Medicare HMO | Source: Ambulatory Visit | Attending: Surgery | Admitting: Surgery

## 2021-09-02 DIAGNOSIS — Z170 Estrogen receptor positive status [ER+]: Secondary | ICD-10-CM | POA: Insufficient documentation

## 2021-09-02 DIAGNOSIS — Z853 Personal history of malignant neoplasm of breast: Secondary | ICD-10-CM | POA: Diagnosis not present

## 2021-09-02 DIAGNOSIS — C50911 Malignant neoplasm of unspecified site of right female breast: Secondary | ICD-10-CM

## 2021-09-02 DIAGNOSIS — Z803 Family history of malignant neoplasm of breast: Secondary | ICD-10-CM | POA: Diagnosis not present

## 2021-09-02 DIAGNOSIS — G8918 Other acute postprocedural pain: Secondary | ICD-10-CM | POA: Diagnosis not present

## 2021-09-02 DIAGNOSIS — Z923 Personal history of irradiation: Secondary | ICD-10-CM | POA: Insufficient documentation

## 2021-09-02 DIAGNOSIS — R928 Other abnormal and inconclusive findings on diagnostic imaging of breast: Secondary | ICD-10-CM | POA: Diagnosis not present

## 2021-09-02 DIAGNOSIS — Z79899 Other long term (current) drug therapy: Secondary | ICD-10-CM | POA: Insufficient documentation

## 2021-09-02 DIAGNOSIS — C50411 Malignant neoplasm of upper-outer quadrant of right female breast: Secondary | ICD-10-CM | POA: Diagnosis not present

## 2021-09-02 DIAGNOSIS — C773 Secondary and unspecified malignant neoplasm of axilla and upper limb lymph nodes: Secondary | ICD-10-CM | POA: Diagnosis not present

## 2021-09-02 DIAGNOSIS — E785 Hyperlipidemia, unspecified: Secondary | ICD-10-CM | POA: Diagnosis not present

## 2021-09-02 HISTORY — PX: BREAST LUMPECTOMY: SHX2

## 2021-09-02 HISTORY — PX: BREAST LUMPECTOMY WITH RADIOACTIVE SEED AND SENTINEL LYMPH NODE BIOPSY: SHX6550

## 2021-09-02 SURGERY — BREAST LUMPECTOMY WITH RADIOACTIVE SEED AND SENTINEL LYMPH NODE BIOPSY
Anesthesia: General | Site: Breast | Laterality: Right

## 2021-09-02 MED ORDER — SODIUM CHLORIDE 0.9 % IV SOLN
INTRAVENOUS | Status: AC
  Filled 2021-09-02: qty 10

## 2021-09-02 MED ORDER — FENTANYL CITRATE (PF) 100 MCG/2ML IJ SOLN
100.0000 ug | Freq: Once | INTRAMUSCULAR | Status: AC
  Administered 2021-09-02: 100 ug via INTRAVENOUS

## 2021-09-02 MED ORDER — LACTATED RINGERS IV SOLN: INTRAVENOUS | Status: DC

## 2021-09-02 MED ORDER — ROPIVACAINE HCL 5 MG/ML IJ SOLN
INTRAMUSCULAR | Status: DC | PRN
  Administered 2021-09-02: 30 mL via PERINEURAL

## 2021-09-02 MED ORDER — IBUPROFEN 800 MG PO TABS: 800.0000 mg | ORAL_TABLET | Freq: Three times a day (TID) | ORAL | 0 refills | Status: DC | PRN

## 2021-09-02 MED ORDER — PROMETHAZINE HCL 25 MG/ML IJ SOLN: 6.2500 mg | INTRAMUSCULAR | Status: DC | PRN

## 2021-09-02 MED ORDER — EPHEDRINE SULFATE 50 MG/ML IJ SOLN
INTRAMUSCULAR | Status: DC | PRN
  Administered 2021-09-02 (×2): 10 mg via INTRAVENOUS

## 2021-09-02 MED ORDER — FENTANYL CITRATE (PF) 100 MCG/2ML IJ SOLN
INTRAMUSCULAR | Status: AC
  Filled 2021-09-02: qty 2

## 2021-09-02 MED ORDER — OXYCODONE HCL 5 MG/5ML PO SOLN: 5.0000 mg | Freq: Once | ORAL | Status: DC | PRN

## 2021-09-02 MED ORDER — VANCOMYCIN HCL 500 MG IV SOLR
INTRAVENOUS | Status: AC
  Filled 2021-09-02: qty 10

## 2021-09-02 MED ORDER — MIDAZOLAM HCL 2 MG/2ML IJ SOLN
INTRAMUSCULAR | Status: AC
  Filled 2021-09-02: qty 2

## 2021-09-02 MED ORDER — PROPOFOL 10 MG/ML IV BOLUS
INTRAVENOUS | Status: DC | PRN
  Administered 2021-09-02: 200 mg via INTRAVENOUS

## 2021-09-02 MED ORDER — LIDOCAINE HCL (CARDIAC) PF 100 MG/5ML IV SOSY
PREFILLED_SYRINGE | INTRAVENOUS | Status: DC | PRN
  Administered 2021-09-02: 40 mg via INTRATRACHEAL

## 2021-09-02 MED ORDER — CEFAZOLIN SODIUM-DEXTROSE 2-4 GM/100ML-% IV SOLN
2.0000 g | INTRAVENOUS | Status: AC
  Administered 2021-09-02: 2 g via INTRAVENOUS

## 2021-09-02 MED ORDER — FENTANYL CITRATE (PF) 100 MCG/2ML IJ SOLN
INTRAMUSCULAR | Status: DC | PRN
  Administered 2021-09-02: 25 ug via INTRAVENOUS
  Administered 2021-09-02: 50 ug via INTRAVENOUS
  Administered 2021-09-02: 25 ug via INTRAVENOUS

## 2021-09-02 MED ORDER — OXYCODONE HCL 5 MG PO TABS: 5.0000 mg | ORAL_TABLET | Freq: Once | ORAL | Status: DC | PRN

## 2021-09-02 MED ORDER — CHLORHEXIDINE GLUCONATE CLOTH 2 % EX PADS: 6.0000 | MEDICATED_PAD | Freq: Once | CUTANEOUS | Status: DC

## 2021-09-02 MED ORDER — HYDROMORPHONE HCL 1 MG/ML IJ SOLN: 0.2500 mg | INTRAMUSCULAR | Status: DC | PRN

## 2021-09-02 MED ORDER — TECHNETIUM TC 99M TILMANOCEPT KIT
1.0000 | PACK | Freq: Once | INTRAVENOUS | Status: AC | PRN
  Administered 2021-09-02: 1 via INTRADERMAL

## 2021-09-02 MED ORDER — MAGTRACE LYMPHATIC TRACER
INTRAMUSCULAR | Status: DC | PRN
  Administered 2021-09-02: 2 mL via INTRAMUSCULAR

## 2021-09-02 MED ORDER — BUPIVACAINE-EPINEPHRINE (PF) 0.25% -1:200000 IJ SOLN
INTRAMUSCULAR | Status: DC | PRN
  Administered 2021-09-02: 5 mL
  Administered 2021-09-02: 19 mL

## 2021-09-02 MED ORDER — MIDAZOLAM HCL 2 MG/2ML IJ SOLN
2.0000 mg | Freq: Once | INTRAMUSCULAR | Status: AC
  Administered 2021-09-02: 2 mg via INTRAVENOUS

## 2021-09-02 MED ORDER — PHENYLEPHRINE HCL (PRESSORS) 10 MG/ML IV SOLN
INTRAVENOUS | Status: DC | PRN
  Administered 2021-09-02: 80 ug via INTRAVENOUS

## 2021-09-02 MED ORDER — HYDROCODONE-ACETAMINOPHEN 5-325 MG PO TABS: 1.0000 | ORAL_TABLET | Freq: Four times a day (QID) | ORAL | 0 refills | Status: DC | PRN

## 2021-09-02 MED ORDER — ONDANSETRON HCL 4 MG/2ML IJ SOLN
INTRAMUSCULAR | Status: DC | PRN
  Administered 2021-09-02: 4 mg via INTRAVENOUS

## 2021-09-02 MED ORDER — AMISULPRIDE (ANTIEMETIC) 5 MG/2ML IV SOLN: 10.0000 mg | Freq: Once | INTRAVENOUS | Status: DC | PRN

## 2021-09-02 MED ORDER — DEXAMETHASONE SODIUM PHOSPHATE 10 MG/ML IJ SOLN
INTRAMUSCULAR | Status: DC | PRN
  Administered 2021-09-02: 4 mg via INTRAVENOUS

## 2021-09-02 MED ORDER — CEFAZOLIN SODIUM-DEXTROSE 2-4 GM/100ML-% IV SOLN
INTRAVENOUS | Status: AC
  Filled 2021-09-02: qty 100

## 2021-09-02 MED ORDER — SODIUM CHLORIDE 0.9 % IV SOLN
INTRAVENOUS | Status: DC | PRN
  Administered 2021-09-02: 500 mL

## 2021-09-02 MED ORDER — VANCOMYCIN HCL 500 MG IV SOLR
INTRAVENOUS | Status: DC | PRN
  Administered 2021-09-02: 500 mg via TOPICAL

## 2021-09-02 SURGICAL SUPPLY — 44 items
APPLIER CLIP 9.375 MED OPEN (MISCELLANEOUS) ×2
BINDER BREAST XXLRG (GAUZE/BANDAGES/DRESSINGS) ×2 IMPLANT
BLADE SURG 15 STRL LF DISP TIS (BLADE) ×1 IMPLANT
BLADE SURG 15 STRL SS (BLADE) ×1
CANISTER SUC SOCK COL 7IN (MISCELLANEOUS) IMPLANT
CANISTER SUCT 1200ML W/VALVE (MISCELLANEOUS) ×2 IMPLANT
CHLORAPREP W/TINT 26 (MISCELLANEOUS) ×2 IMPLANT
CLIP APPLIE 9.375 MED OPEN (MISCELLANEOUS) ×1 IMPLANT
COVER BACK TABLE 60X90IN (DRAPES) ×2 IMPLANT
COVER MAYO STAND STRL (DRAPES) ×2 IMPLANT
COVER PROBE W GEL 5X96 (DRAPES) ×2 IMPLANT
DECANTER SPIKE VIAL GLASS SM (MISCELLANEOUS) IMPLANT
DERMABOND ADVANCED (GAUZE/BANDAGES/DRESSINGS) ×1
DERMABOND ADVANCED .7 DNX12 (GAUZE/BANDAGES/DRESSINGS) ×1 IMPLANT
DRAPE LAPAROSCOPIC ABDOMINAL (DRAPES) ×2 IMPLANT
DRAPE UTILITY XL STRL (DRAPES) ×2 IMPLANT
ELECT COATED BLADE 2.86 ST (ELECTRODE) ×2 IMPLANT
ELECT REM PT RETURN 9FT ADLT (ELECTROSURGICAL) ×2
ELECTRODE REM PT RTRN 9FT ADLT (ELECTROSURGICAL) ×1 IMPLANT
GLOVE SRG 8 PF TXTR STRL LF DI (GLOVE) ×1 IMPLANT
GLOVE SURG LTX SZ8 (GLOVE) ×2 IMPLANT
GLOVE SURG POLYISO LF SZ7 (GLOVE) ×2 IMPLANT
GLOVE SURG UNDER POLY LF SZ6.5 (GLOVE) ×2 IMPLANT
GLOVE SURG UNDER POLY LF SZ7 (GLOVE) ×4 IMPLANT
GLOVE SURG UNDER POLY LF SZ8 (GLOVE) ×1
GOWN STRL REUS W/ TWL LRG LVL3 (GOWN DISPOSABLE) ×2 IMPLANT
GOWN STRL REUS W/ TWL XL LVL3 (GOWN DISPOSABLE) ×1 IMPLANT
GOWN STRL REUS W/TWL LRG LVL3 (GOWN DISPOSABLE) ×2
GOWN STRL REUS W/TWL XL LVL3 (GOWN DISPOSABLE) ×1
KIT MARKER MARGIN INK (KITS) ×2 IMPLANT
NEEDLE HYPO 25X1 1.5 SAFETY (NEEDLE) ×2 IMPLANT
NS IRRIG 1000ML POUR BTL (IV SOLUTION) ×2 IMPLANT
PACK BASIN DAY SURGERY FS (CUSTOM PROCEDURE TRAY) ×2 IMPLANT
PENCIL SMOKE EVACUATOR (MISCELLANEOUS) ×2 IMPLANT
SLEEVE SCD COMPRESS KNEE MED (STOCKING) ×2 IMPLANT
SPONGE T-LAP 4X18 ~~LOC~~+RFID (SPONGE) ×4 IMPLANT
SUT MNCRL AB 4-0 PS2 18 (SUTURE) ×2 IMPLANT
SUT VICRYL 3-0 CR8 SH (SUTURE) ×2 IMPLANT
SYR CONTROL 10ML LL (SYRINGE) ×2 IMPLANT
TOWEL GREEN STERILE FF (TOWEL DISPOSABLE) ×2 IMPLANT
TRACER MAGTRACE VIAL (MISCELLANEOUS) ×2 IMPLANT
TRAY FAXITRON CT DISP (TRAY / TRAY PROCEDURE) ×2 IMPLANT
TUBE CONNECTING 20X1/4 (TUBING) ×2 IMPLANT
YANKAUER SUCT BULB TIP NO VENT (SUCTIONS) ×2 IMPLANT

## 2021-09-02 NOTE — Interval H&P Note (Signed)
History and Physical Interval Note:  09/02/2021 11:54 AM  Michele Page  has presented today for surgery, with the diagnosis of RIGHT BREAST CANCER.  The various methods of treatment have been discussed with the patient and family. After consideration of risks, benefits and other options for treatment, the patient has consented to  Procedure(s): RIGHT BREAST LUMPECTOMY WITH RADIOACTIVE SEED AND SENTINEL LYMPH NODE BIOPSY (Right) as a surgical intervention.  The patient's history has been reviewed, patient examined, no change in status, stable for surgery.  I have reviewed the patient's chart and labs.  Questions were answered to the patient's satisfaction.     [place]

## 2021-09-02 NOTE — Anesthesia Procedure Notes (Signed)
Anesthesia Regional Block: Pectoralis block   Pre-Anesthetic Checklist: , timeout performed,  Correct Patient, Correct Site, Correct Laterality,  Correct Procedure, Correct Position, site marked,  Risks and benefits discussed,  Surgical consent,  Pre-op evaluation,  At surgeon's request and post-op pain management  Laterality: Right  Prep: chloraprep       Needles:  Injection technique: Single-shot  Needle Type: Stimiplex     Needle Length: 9cm  Needle Gauge: 21     Additional Needles:   Procedures:,,,, ultrasound used (permanent image in chart),,    Narrative:  Start time: 09/02/2021 11:52 AM End time: 09/02/2021 11:57 AM Injection made incrementally with aspirations every 5 mL.  Performed by: Personally  Anesthesiologist: [name], MD

## 2021-09-02 NOTE — Anesthesia Postprocedure Evaluation (Signed)
Anesthesia Post Note  Patient: Michele Page  Procedure(s) Performed: RIGHT BREAST LUMPECTOMY WITH RADIOACTIVE SEED AND SENTINEL LYMPH NODE BIOPSY (Right: Breast)     Patient location during evaluation: PACU Anesthesia Type: General Level of consciousness: awake and alert Pain management: pain level controlled Vital Signs Assessment: post-procedure vital signs reviewed and stable Respiratory status: spontaneous breathing, nonlabored ventilation and respiratory function stable Cardiovascular status: blood pressure returned to baseline and stable Postop Assessment: no apparent nausea or vomiting Anesthetic complications: no   No notable events documented.  Last Vitals:  Vitals:   09/02/21 1415 09/02/21 1443  BP: (!) 157/78 (!) 150/70  Pulse: 73 74  Resp: 12 18  Temp:  36.6 C  SpO2: 98% 96%    Last Pain:  Vitals:   09/02/21 1443  TempSrc: Oral  PainSc: 0-No pain                 [name]

## 2021-09-02 NOTE — Progress Notes (Signed)
Emotional support provided through nuclear medicine breast injections. Vital signs stable. Patient tolerated well.  

## 2021-09-02 NOTE — Anesthesia Procedure Notes (Signed)
Procedure Name: LMA Insertion Date/Time: 09/02/2021 12:23 PM Performed by: Glory Buff, CRNA Pre-anesthesia Checklist: Patient identified, Emergency Drugs available, Suction available and Patient being monitored Patient Re-evaluated:Patient Re-evaluated prior to induction Oxygen Delivery Method: Circle system utilized Preoxygenation: Pre-oxygenation with 100% oxygen Induction Type: IV induction LMA: LMA inserted LMA Size: 4.0 Number of attempts: 1 Placement Confirmation: CO2 detector Tube secured with: Tape Dental Injury: Teeth and Oropharynx as per pre-operative assessment

## 2021-09-02 NOTE — Anesthesia Preprocedure Evaluation (Addendum)
Anesthesia Evaluation  Patient identified by MRN, date of birth, ID band Patient awake    Reviewed: Allergy & Precautions, NPO status , Patient's Chart, lab work & pertinent test results  Airway Mallampati: II  TM Distance: >3 FB Neck ROM: Full    Dental no notable dental hx.    Pulmonary neg pulmonary ROS, COPD, former smoker,    Pulmonary exam normal breath sounds clear to auscultation       Cardiovascular hypertension, Pt. on medications Normal cardiovascular exam Rhythm:Regular Rate:Normal     Neuro/Psych MS CVA, Residual Symptoms negative psych ROS   GI/Hepatic Neg liver ROS, GERD  ,  Endo/Other  negative endocrine ROS  Renal/GU Renal InsufficiencyRenal disease  negative genitourinary   Musculoskeletal  (+) Arthritis , Osteoarthritis,    Abdominal (+) + obese,   Peds negative pediatric ROS (+)  Hematology negative hematology ROS (+)   Anesthesia Other Findings   Reproductive/Obstetrics negative OB ROS                             Anesthesia Physical  Anesthesia Plan  ASA: III  Anesthesia Plan: General   Post-op Pain Management: GA combined w/ Regional for post-op pain   Induction: Intravenous  PONV Risk Score and Plan: 3 and Ondansetron, Dexamethasone, Midazolam and Treatment may vary due to age or medical condition  Airway Management Planned: LMA  Additional Equipment:   Intra-op Plan:   Post-operative Plan: Extubation in OR  Informed Consent: I have reviewed the patients History and Physical, chart, labs and discussed the procedure including the risks, benefits and alternatives for the proposed anesthesia with the patient or authorized representative who has indicated his/her understanding and acceptance.     Dental advisory given  Plan Discussed with: CRNA and Surgeon  Anesthesia Plan Comments:         Anesthesia Quick Evaluation

## 2021-09-02 NOTE — Transfer of Care (Signed)
Immediate Anesthesia Transfer of Care Note  Patient: Michele Page  Procedure(s) Performed: RIGHT BREAST LUMPECTOMY WITH RADIOACTIVE SEED AND SENTINEL LYMPH NODE BIOPSY (Right: Breast)  Patient Location: PACU  Anesthesia Type:General  Level of Consciousness: drowsy, patient cooperative and responds to stimulation  Airway & Oxygen Therapy: Patient Spontanous Breathing and Patient connected to face mask oxygen  Post-op Assessment: Report given to RN and Post -op Vital signs reviewed and stable  Post vital signs: Reviewed and stable  Last Vitals:  Vitals Value Taken Time  BP 119/70 09/02/21 1350  Temp    Pulse 71 09/02/21 1351  Resp 12 09/02/21 1351  SpO2 94 % 09/02/21 1351  Vitals shown include unvalidated device data.  Last Pain:  Vitals:   09/02/21 1031  TempSrc: Oral  PainSc: 0-No pain         Complications: No notable events documented.

## 2021-09-02 NOTE — Progress Notes (Signed)
Assisted Dr. Miller with right, ultrasound guided, pectoralis block. Side rails up, monitors on throughout procedure. See vital signs in flow sheet. Tolerated Procedure well. 

## 2021-09-02 NOTE — Op Note (Signed)
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  Drains: None  IV fluids: Per anesthesia record  Indications for procedure: The patient is a 65 year old female with stage II right breast cancer upper outer quadrant.  She is opted for breast conserving surgery with lumpectomy and sentinel lymph node mapping.  Risk, benefits and alternative treatments were discussed.  Lymphedema risk discussed.  She agreed to proceed with breast conserving surgery.The procedure has been discussed with the patient. Alternatives to surgery have been discussed with the patient.  Risks of surgery include bleeding,  Infection,  Seroma formation, death,  and the need for further surgery.   The patient understands and wishes to proceed. Sentinel lymph node mapping and dissection has been discussed with the patient.  Risk of bleeding,  Infection,  Seroma formation,  Additional procedures,,  Shoulder weakness ,  Shoulder stiffness,  Nerve and blood vessel injury and reaction to the mapping dyes have been discussed.  Alternatives to surgery have been discussed with the patient.  The patient agrees to proceed.     Description of procedure: The patient was met in the holding area and questions were answered.  Right breast was marked as the correct site.  Timeout was done for injection of mag trace dye.  Under sterile conditions after timeout the right breast nipple was prepped.  This was done sterilely.  4 cc of quarter percent Marcaine was used for local anesthetic.  I injected 3 cc of mag trace in the subareolar  plexus.  This was tolerated well.  She also underwent a pectoral block anesthesia on the right.  She also underwent injection with nuclear medicine as well.  She was taken back to operating.  She is placed upon upon the OR table.  After induction of general esthesia the right breast was prepped and draped in sterile fashion timeout performed.  Proper patient, site and procedure verified.  Neoprobe used to identify the seed in the right upper breast.  Curvilinear incision made along the superior border the nipple-areolar complex.  Dissection was carried down all tissue around the mass and clip were excised with a grossly negative margin.  Faxitron revealed both seed and clip to be in the specimen.  This was sent to pathology.  Gross margins were negative.  Cavities made hemostatic with cautery.  Was closed with 3-0 Vicryl and 4 Monocryl after infiltration with local anesthetic.  The mag trace probe was available.  Hotspot identified in the right axilla.  A 4 cm incision was made in the right axilla.  Dissection was carried down the level 1 contents.  There were 4 hot nodes removed.  Background counts approached 0.  Hemostasis achieved.  Long thoracic nerve, thoracodorsal trunk and axillary vein were all preserved.  After irrigation hemostasis achieved with cautery, was closed with 3-0 Vicryl and 4 Monocryl.  Dermabond applied.  All counts found to be correct.  Breast binder placed.  The patient was awoke extubated taken to recovery in satisfactory condition.

## 2021-09-02 NOTE — H&P (Signed)
Subjective   Chief Complaint: Breast Problem   History of Present Illness: Michele Page is a 65 y.o. female who is seen today as an office consultation at the request of Dr. Lindi Adie for evaluation of Breast Problem . Patient presents for evaluation of right breast mass. This was detected on recent screening mammogram in the upper outer quadrant. 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 This was hormone receptor positive. History of left breast cancer in 2017 treated with breast conserving surgery, radiation therapy and antiestrogen therapy. 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 She has not noticed any significant changes to her breast. She had a nodule in the left breast near the incision site that was a scar has been present but has not changed for years.  Review of Systems: A complete review of systems was obtained from the patient. I have reviewed this information and discussed as appropriate with the patient. See HPI as well for other ROS.    Medical History: Past Medical History:  Diagnosis Date   History of cancer   History of stroke   Hypertension   There is no problem list on file for this patient.  History reviewed. No pertinent surgical history.   No Known Allergies  Current Outpatient Medications on File Prior to Visit  Medication Sig Dispense Refill   atorvastatin (LIPITOR) 20 MG tablet Take 1 tablet by mouth once daily   allopurinoL (ZYLOPRIM) 100 MG tablet Take 200 mg by mouth once daily   labetaloL (TRANDATE) 300 MG tablet   niFEdipine (PROCARDIA-XL) 60 MG (OSM) XL tablet Take 60 mg by mouth 2 (two) times daily   No current facility-administered medications on file prior to visit.   Family History  Problem Relation Age of Onset   High blood pressure (Hypertension) Mother   Breast cancer Mother    Social History   Tobacco Use  Smoking Status Never Smoker  Smokeless Tobacco  Never Used    Social History   Socioeconomic History   Marital status: Married  Tobacco Use   Smoking status: Never Smoker   Smokeless tobacco: Never Used  Scientific laboratory technician Use: Never used  Substance and Sexual Activity   Alcohol use: Not Currently   Drug use: Never   Objective:   Vitals:  07/29/21 1417  BP: 139/70  Pulse: 79  Temp: 36.8 C (98.2 F)  SpO2: 97%  Weight: 92.1 kg (203 lb)  Height: 160 cm (5' 3" )   Body mass index is 35.96 kg/m.    General appearance: No apparent distress extraocular movements are intact  Neck: Trachea midline no mass  Cardiovascular: Regular rate and rhythm  Pulmonary: Lungs are clear no stridor  Breast: Right breast upper outer quadrant is a 2 cm mobile mass at the adjacent to the nipple. Left breast shows postsurgical changes and some scarring around the previous lumpectomy site around the nipple areolar complex.  Lymphatic: No evidence of axillary or supraclavicular adenopathy  Extremities: No clubbing cyanosis nor edema. No evidence of swelling. No deformity.  Neuro: Extraocular movements are intact. Grossly normal   Labs, Imaging and Diagnostic Testing:  ADDITIONAL INFORMATION: E-cadherin is NEGATIVE supporting lobular origin. Thressa Sheller MD Pathologist, Electronic Signature ( Signed 07/28/2021) FINAL DIAGNOSIS Diagnosis Breast, right, needle core biopsy, 11 o'clock - INVASIVE MAMMARY CARCINOMA - SEE COMMENT Microscopic Comment The biopsy material shows an infiltrative proliferation of cells with inconspicuous nucleoli, arranged linearly  and in small clusters. Based on the biopsy, the carcinoma appears Nottingham grade 2 of 3 and measures 1.2 cm in greatest linear extent. E-cadherin and prognostic markers (ER/PR/ki-67/HER2)are pending and will be reported in an addendum. Dr. Vic Ripper reviewed the case and agrees with the above diagnosis. These results were called to [facility] on July 25, 2021. Thressa Sheller MD Pathologist, Electronic Signature (Case signed 07/25/2021)  Patient describes a new palpable lump within the RIGHT breast. History of LEFT breast cancer in 2017 status post lumpectomy.   EXAM: DIGITAL DIAGNOSTIC UNILATERAL RIGHT MAMMOGRAM WITH TOMOSYNTHESIS AND CAD; ULTRASOUND RIGHT BREAST LIMITED   TECHNIQUE: Right digital diagnostic mammography and breast tomosynthesis was performed. The images were evaluated with computer-aided detection.; Targeted ultrasound examination of the right breast was performed   COMPARISON: Previous exams including most recent annual bilateral mammogram dated 12/09/2020.   ACR Breast Density Category b: There are scattered areas of fibroglandular density.   FINDINGS: There is a new partially obscured mass within the slightly outer RIGHT breast, corresponding to the palpable area of concern with overlying skin marker in place.   Targeted ultrasound is performed, showing a hypoechoic mass in the RIGHT breast at the 11 o'clock axis, 2 cm from the nipple, measuring 2.3 x 0.9 x 1.7 cm, with internal vascularity, corresponding to the palpable area of concern.   RIGHT axilla was evaluated with ultrasound showing no enlarged or morphologically abnormal lymph nodes.   IMPRESSION: Suspicious mass within the RIGHT breast at the 11 o'clock axis, 2 cm from the nipple, measuring 2.3 cm, with prominent internal vascularity, corresponding to the palpable area of concern. Ultrasound-guided biopsy is recommended.   RECOMMENDATION: Ultrasound-guided biopsy of the RIGHT breast mass at the 11 o'clock axis, 2 cm from the nipple, measuring 2.3 cm.   Ultrasound-guided biopsy is scheduled for September 8th.   I have discussed the findings and recommendations with the patient. If applicable, a reminder letter will be sent to the patient regarding the next appointment.   BI-RADS CATEGORY 5: Highly suggestive of malignancy.      Electronically Signed By: Franki Cabot M.D. On: 07/10/2021 15:59  Assessment and Plan:  Diagnoses and all orders for this visit:  Malignant neoplasm of upper-outer quadrant of right breast in female, estrogen receptor positive (CMS-[name])    MRI  Breast conserving surgery Right breast seed lumpectomy and SLN mapping   Discussed surgical options of lumpectomy versus mastectomy reconstruction. Pros and cons of both discussed at length. She is opted for breast conserving surgery on the right. Risk of bleeding, infection, lymphedema, shoulder stiffness, decreased range of motion, nerve injury, blood vessel injury, the need for treatments and procedures discussed with the patient. Other risk for blood clot, stroke, death, PE, and other cardiovascular events as well as anesthesia risk.  No follow-ups on file.  Kennieth Francois, MD   I spent a total of 45 minutes in both face-to-face and non-face-to-face activities for this visit on the date of this encounter.

## 2021-09-02 NOTE — Discharge Instructions (Addendum)
[facility] Office Phone Number [phone]  BREAST BIOPSY/ PARTIAL MASTECTOMY: POST OP INSTRUCTIONS  Always review your discharge instruction sheet given to you by the facility where your surgery was performed.  IF YOU HAVE DISABILITY OR FAMILY LEAVE FORMS, YOU MUST BRING THEM TO THE OFFICE FOR PROCESSING.  DO NOT GIVE THEM TO YOUR DOCTOR.  A prescription for pain medication may be given to you upon discharge.  Take your pain medication as prescribed, if needed.  If narcotic pain medicine is not needed, then you may take acetaminophen (Tylenol) or ibuprofen (Advil) as needed. Take your usually prescribed medications unless otherwise directed If you need a refill on your pain medication, please contact your pharmacy.  They will contact our office to request authorization.  Prescriptions will not be filled after 5pm or on week-ends. You should eat very light the first 24 hours after surgery, such as soup, crackers, pudding, etc.  Resume your normal diet the day after surgery. Most patients will experience some swelling and bruising in the breast.  Ice packs and a good support bra will help.  Swelling and bruising can take several days to resolve.  It is common to experience some constipation if taking pain medication after surgery.  Increasing fluid intake and taking a stool softener will usually help or prevent this problem from occurring.  A mild laxative (Milk of Magnesia or Miralax) should be taken according to package directions if there are no bowel movements after 48 hours. Unless discharge instructions indicate otherwise, you may remove your bandages 24-48 hours after surgery, and you may shower at that time.  You may have steri-strips (small skin tapes) in place directly over the incision.  These strips should be left on the skin for 7-10 days.  If your surgeon used skin glue on the incision, you may shower in 24 hours.  The glue will flake off over the next 2-3 weeks.  Any  sutures or staples will be removed at the office during your follow-up visit. ACTIVITIES:  You may resume regular daily activities (gradually increasing) beginning the next day.  Wearing a good support bra or sports bra minimizes pain and swelling.  You may have sexual intercourse when it is comfortable. You may drive when you no longer are taking prescription pain medication, you can comfortably wear a seatbelt, and you can safely maneuver your car and apply brakes. RETURN TO WORK:  ______________________________________________________________________________________ You should see your doctor in the office for a follow-up appointment approximately two weeks after your surgery.  Your doctor's nurse will typically make your follow-up appointment when she calls you with your pathology report.  Expect your pathology report 2-3 business days after your surgery.  You may call to check if you do not hear from us after three days. OTHER INSTRUCTIONS: _______________________________________________________________________________________________ _____________________________________________________________________________________________________________________________________ _____________________________________________________________________________________________________________________________________ _____________________________________________________________________________________________________________________________________  WHEN TO CALL YOUR DOCTOR: Fever over 101.0 Nausea and/or vomiting. Extreme swelling or bruising. Continued bleeding from incision. Increased pain, redness, or drainage from the incision.  The clinic staff is available to answer your questions during regular business hours.  Please don't hesitate to call and ask to speak to one of the nurses for clinical concerns.  If you have a medical emergency, go to the nearest emergency room or call 911.  A surgeon from [facility] is always on call at the hospital.  For further questions, please visit [email or web address]    Post Anesthesia Home Care Instructions  Activity: Get plenty of rest for the remainder of   the day. A responsible individual must stay with you for 24 hours following the procedure.  For the next 24 hours, DO NOT: -Drive a car -Operate machinery -Drink alcoholic beverages -Take any medication unless instructed by your physician -Make any legal decisions or sign important papers.  Meals: Start with liquid foods such as gelatin or soup. Progress to regular foods as tolerated. Avoid greasy, spicy, heavy foods. If nausea and/or vomiting occur, drink only clear liquids until the nausea and/or vomiting subsides. Call your physician if vomiting continues.  Special Instructions/Symptoms: Your throat may feel dry or sore from the anesthesia or the breathing tube placed in your throat during surgery. If this causes discomfort, gargle with warm salt water. The discomfort should disappear within 24 hours.  If you had a scopolamine patch placed behind your ear for the management of post- operative nausea and/or vomiting:  1. The medication in the patch is effective for 72 hours, after which it should be removed.  Wrap patch in a tissue and discard in the trash. Wash hands thoroughly with soap and water. 2. You may remove the patch earlier than 72 hours if you experience unpleasant side effects which may include dry mouth, dizziness or visual disturbances. 3. Avoid touching the patch. Wash your hands with soap and water after contact with the patch.      

## 2021-09-04 ENCOUNTER — Encounter (HOSPITAL_BASED_OUTPATIENT_CLINIC_OR_DEPARTMENT_OTHER): Payer: Self-pay | Admitting: Surgery

## 2021-09-08 LAB — SURGICAL PATHOLOGY

## 2021-09-09 ENCOUNTER — Ambulatory Visit: Payer: Medicare HMO | Admitting: Family

## 2021-09-09 ENCOUNTER — Encounter: Payer: Self-pay | Admitting: *Deleted

## 2021-09-11 ENCOUNTER — Encounter: Payer: Self-pay | Admitting: Surgery

## 2021-09-12 ENCOUNTER — Ambulatory Visit: Payer: Self-pay | Admitting: Surgery

## 2021-09-12 ENCOUNTER — Encounter: Payer: Self-pay | Admitting: *Deleted

## 2021-09-12 ENCOUNTER — Ambulatory Visit: Payer: Medicare HMO | Admitting: Hematology and Oncology

## 2021-09-16 DIAGNOSIS — Z0100 Encounter for examination of eyes and vision without abnormal findings: Secondary | ICD-10-CM | POA: Diagnosis not present

## 2021-09-16 DIAGNOSIS — H524 Presbyopia: Secondary | ICD-10-CM | POA: Diagnosis not present

## 2021-09-24 NOTE — Progress Notes (Signed)
Patient Care Team: [name], NP as PCP - General (Geriatric Medicine) Lorretta Harp, MD as PCP - Cardiology (Cardiology) Nicholas Lose, MD as Consulting Physician (Oncology) Erroll Luna, MD as Consulting Physician (General Surgery) Thea Silversmith, MD as Consulting Physician (Radiation Oncology) Mauro Kaufmann, RN as Oncology Nurse Navigator Rockwell Germany, RN as Oncology Nurse Navigator  DIAGNOSIS:    ICD-10-CM   1. Malignant neoplasm of upper-outer quadrant of right breast in female, estrogen receptor positive ([place])  C50.411    Z17.0       SUMMARY OF ONCOLOGIC HISTORY: Oncology History  Breast cancer of upper-outer quadrant of left female breast ([place])  03/31/2016 Initial Diagnosis   Screening detected left breast mass, 2 nodules, 1.9 x 1.6 x 0.8 cm= fat necrosis; 8 x 7 x 7 mm= grade 2 IDC ER 100%, PR 100%, HER-2 negative ratio 1.39, Ki-67 20%   04/17/2016 Surgery   Left lumpectomy ([name]): IDC grade 2, 1.3 cm, with DCIS, margins negative, 0/4 lymph nodes negative, T1 cN0 stage IA pathologic stage, ER 100%, PR 100%, HER-2 negative ratio 1.39, Ki-67 20% Oncotype DX score 20, 13% ROR, intermediate risk   04/17/2016 Oncotype testing   Recurrence score: 20; ROR 15% (intermediate risk)    05/27/2016 - 07/13/2016 Radiation Therapy   Adjuvant radiation therapy [facility]): Left breast treated with breath hold to 50.4 Gy in 28 fractions at 1.8 Gy/fraction.  Left breast boosted to 10 Gy in 5 fractions at 2 Gy/fraction   07/23/2016 -  Anti-estrogen oral therapy   Anastrozole 1 mg switched to letrozole 04/22/2017 due to hair loss, switch to tamoxifen 10/22/2017 due to hair loss from letrozole as well   Malignant neoplasm of upper-outer quadrant of right breast in female, estrogen receptor positive ([place])  08/04/2021 Initial Diagnosis   Malignant neoplasm of upper-outer quadrant of right breast in female, estrogen receptor positive ([name])    Relapse/Recurrence   2.2 cm ILC ER  100%, PR 90%, Her 2 Neg, Ki 67: 25%, Size 2.5 cm   08/04/2021 Cancer Staging   Staging form: Breast, AJCC 8th Edition - Clinical: Stage IB (cT2, cN0, cM0, G2, ER+, PR+, HER2-) - Signed by Nicholas Lose, MD on 08/04/2021 Stage prefix: Initial diagnosis Histologic grading system: 3 grade system    09/02/2021 Surgery   Right lumpectomy: Grade 2 ILC 3.1 cm, margins negative, LVI present, 4/6 lymph nodes macrometastases, 2 lymph nodes isolated tumor cells, ER 100%, PR 90%, Ki-67 25%, HER2 negative     CHIEF COMPLIANT: Follow-up of left breast cancer on anastrozole  INTERVAL HISTORY: Michele Page is a 65 y.o. with above-mentioned history of left breast cancer treated with lumpectomy, radiation, and who is currently on anastrozole. She presents to the clinic today for follow-up.   ALLERGIES:  has No Known Allergies.  MEDICATIONS:  Current Outpatient Medications  Medication Sig Dispense Refill   anastrozole (ARIMIDEX) 1 MG tablet Take 1 tablet (1 mg total) by mouth daily. 90 tablet 3   aspirin EC 81 MG tablet Take 1 tablet (81 mg total) by mouth daily. 30 tablet 6   atorvastatin (LIPITOR) 20 MG tablet TAKE 1 TABLET EVERY DAY 90 tablet 1   eszopiclone (LUNESTA) 2 MG TABS tablet Take one tablet by mouth at bedtime as needed for sleep (take immediately before bedtime) 30 tablet 0   HYDROcodone-acetaminophen (NORCO/VICODIN) 5-325 MG tablet Take 1 tablet by mouth every 6 (six) hours as needed for moderate pain. 15 tablet 0   ibuprofen (ADVIL)  800 MG tablet Take 1 tablet (800 mg total) by mouth every 8 (eight) hours as needed. 30 tablet 0   labetalol (NORMODYNE) 300 MG tablet TAKE 2 TABLETS THREE TIMES DAILY 540 tablet 1   NIFEdipine (PROCARDIA XL/NIFEDICAL XL) 60 MG 24 hr tablet Take 1 tablet (60 mg total) by mouth 2 (two) times daily. 180 tablet 3   No current facility-administered medications for this visit.    PHYSICAL EXAMINATION: ECOG PERFORMANCE STATUS: 1 - Symptomatic but completely  ambulatory  Vitals:   09/25/21 1409  BP: (!) 145/63  Pulse: 75  Resp: 18  Temp: (!) 97.3 F (36.3 C)  SpO2: 98%   Filed Weights   09/25/21 1409  Weight: 202 lb 11.2 oz (91.9 kg)    BREAST: No palpable masses or nodules in either right or left breasts. No palpable axillary supraclavicular or infraclavicular adenopathy no breast tenderness or nipple discharge. (exam performed in the presence of a chaperone)  LABORATORY DATA:  I have reviewed the data as listed CMP Latest Ref Rng & Units 08/12/2021 04/18/2021 11/18/2020  Glucose 70 - 99 mg/dL 126(H) 96 91  BUN 8 - 23 mg/dL 27(H) 17 15  Creatinine 0.44 - 1.00 mg/dL 2.07(H) 1.61(H) 1.33(H)  Sodium 135 - 145 mmol/L 144 140 142  Potassium 3.5 - 5.1 mmol/L 3.6 4.0 4.2  Chloride 98 - 111 mmol/L 109 105 106  CO2 22 - 32 mmol/L 22 22 24   Calcium 8.9 - 10.3 mg/dL 9.8 9.6 9.9  Total Protein 6.5 - 8.1 g/dL 7.2 6.7 7.3  Total Bilirubin 0.3 - 1.2 mg/dL 0.5 0.5 0.6  Alkaline Phos 38 - 126 U/L 109 - -  AST 15 - 41 U/L 23 28 25   ALT 0 - 44 U/L 17 25 20     Lab Results  Component Value Date   WBC 5.3 08/12/2021   HGB 11.5 (L) 08/12/2021   HCT 33.1 (L) 08/12/2021   MCV 90.2 08/12/2021   PLT 286 08/12/2021   NEUTROABS 3.2 08/12/2021    ASSESSMENT & PLAN:  Malignant neoplasm of upper-outer quadrant of right breast in female, estrogen receptor positive ([place]) 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, 1.3 cm, with DCIS, margins negative, 0/4 lymph nodes negative, T1 cN0 stage IA pathologic stage, ER 100%, PR 100%, HER-2 negative ratio 1.39, Ki-67 20% (Originally 2 nodules were detected on screening mammogram 1.9 cm= fat necrosis and 8 mm IDC) Oncotype DX score 20, 13% risk of recurrence, intermediate risk Adjuvant radiation therapy started 07/20/2017completed 07/13/2016   09/02/2021:Right lumpectomy: Grade 2 ILC 3.1 cm, margins negative, LVI present, 4/6 lymph nodes macrometastases, 2 lymph nodes isolated tumor cells, ER 100%, PR 90%, Ki-67 25%,  HER2 negative  Pathology counseling: I discussed the final pathology report of the patient provided  a copy of this report. I discussed the margins as well as lymph node surgeries. We also discussed the final staging along with previously performed ER/PR and HER-2/neu testing.  Treatment plan: 1.  Based on for enlarged lymph nodes, recommended systemic chemotherapy with CMF x8 cycles 2. adjuvant radiation therapy 3.  For the continued antiestrogen therapy (previously anastrozole started 07/23/2016 switched to letrozole 2018 switched to tamoxifen 2018, switched back to anastrozole) hair loss was the main problem, may consider exemestane  Recommended that we perform CT chest abdomen pelvis and bone scan Also recommended genetics Counseled her extensively about chemotherapy risks and benefits.  She is agreeable to proceed. She is going to [place] 09/30/2021 until 10/07/2021. We will need  to set up her scans after she comes back from [place].  No orders of the defined types were placed in this encounter.  The patient has a good understanding of the overall plan. she agrees with it. she will call with any problems that may develop before the next visit here.  Total time spent: 20 mins including face to face time and time spent for planning, charting and coordination of care  Michele Eisenmenger, MD, MPH 09/25/2021  I, Thana Ates, am acting as scribe for Dr. Nicholas Lose.  I have reviewed the above documentation for accuracy and completeness, and I agree with the above.

## 2021-09-25 ENCOUNTER — Other Ambulatory Visit: Payer: Self-pay

## 2021-09-25 ENCOUNTER — Encounter: Payer: Self-pay | Admitting: *Deleted

## 2021-09-25 ENCOUNTER — Inpatient Hospital Stay: Payer: Medicare HMO | Attending: Hematology and Oncology | Admitting: Hematology and Oncology

## 2021-09-25 DIAGNOSIS — Z79811 Long term (current) use of aromatase inhibitors: Secondary | ICD-10-CM | POA: Insufficient documentation

## 2021-09-25 DIAGNOSIS — C50411 Malignant neoplasm of upper-outer quadrant of right female breast: Secondary | ICD-10-CM | POA: Insufficient documentation

## 2021-09-25 DIAGNOSIS — Z170 Estrogen receptor positive status [ER+]: Secondary | ICD-10-CM | POA: Insufficient documentation

## 2021-09-25 DIAGNOSIS — C50412 Malignant neoplasm of upper-outer quadrant of left female breast: Secondary | ICD-10-CM | POA: Diagnosis present

## 2021-09-25 NOTE — Assessment & Plan Note (Signed)
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 I discussed the margins as well as lymph node surgeries. We also discussed the final staging along with previously performed ER/PR and HER-2/neu testing.  Treatment plan: 1.  Based on for enlarged lymph nodes, recommended systemic chemotherapy with dose dense Adriamycin and Cytoxan x4 followed by Taxol weekly x12 2. adjuvant radiation therapy 3.  For the continued antiestrogen therapy (previously anastrozole started 07/23/2016 switched to letrozole 2018 switched to tamoxifen 2018, switched back to anastrozole) hair loss was the main problem, may consider exemestane  Recommended that we perform CT chest abdomen pelvis and bone scan Also recommended genetics

## 2021-09-26 ENCOUNTER — Ambulatory Visit: Payer: Self-pay | Admitting: Surgery

## 2021-09-26 ENCOUNTER — Encounter: Payer: Self-pay | Admitting: *Deleted

## 2021-09-29 ENCOUNTER — Other Ambulatory Visit: Payer: Self-pay

## 2021-09-29 ENCOUNTER — Ambulatory Visit (HOSPITAL_COMMUNITY)
Admission: RE | Admit: 2021-09-29 | Discharge: 2021-09-29 | Disposition: A | Discharge status: Home / Self Care | Payer: Medicare HMO | Source: Ambulatory Visit | Attending: Hematology and Oncology | Admitting: Hematology and Oncology

## 2021-09-29 ENCOUNTER — Encounter (HOSPITAL_COMMUNITY)
Admission: RE | Admit: 2021-09-29 | Discharge: 2021-09-29 | Disposition: A | Discharge status: Home / Self Care | Payer: Medicare HMO | Source: Ambulatory Visit | Attending: Hematology and Oncology | Admitting: Hematology and Oncology

## 2021-09-29 ENCOUNTER — Other Ambulatory Visit (HOSPITAL_COMMUNITY): Payer: Medicare HMO

## 2021-09-29 DIAGNOSIS — C50919 Malignant neoplasm of unspecified site of unspecified female breast: Secondary | ICD-10-CM | POA: Diagnosis not present

## 2021-09-29 DIAGNOSIS — C50411 Malignant neoplasm of upper-outer quadrant of right female breast: Secondary | ICD-10-CM | POA: Insufficient documentation

## 2021-09-29 DIAGNOSIS — N6310 Unspecified lump in the right breast, unspecified quadrant: Secondary | ICD-10-CM | POA: Diagnosis not present

## 2021-09-29 DIAGNOSIS — Z170 Estrogen receptor positive status [ER+]: Secondary | ICD-10-CM | POA: Insufficient documentation

## 2021-09-29 MED ORDER — TECHNETIUM TC 99M MEDRONATE IV KIT
20.0000 | PACK | Freq: Once | INTRAVENOUS | Status: AC | PRN
  Administered 2021-09-29: 21.8 via INTRAVENOUS

## 2021-09-30 ENCOUNTER — Ambulatory Visit (HOSPITAL_BASED_OUTPATIENT_CLINIC_OR_DEPARTMENT_OTHER): Admit: 2021-09-30 | Payer: Medicare HMO | Admitting: Surgery

## 2021-09-30 ENCOUNTER — Other Ambulatory Visit: Payer: Self-pay | Admitting: *Deleted

## 2021-09-30 ENCOUNTER — Encounter (HOSPITAL_BASED_OUTPATIENT_CLINIC_OR_DEPARTMENT_OTHER): Payer: Self-pay

## 2021-09-30 DIAGNOSIS — Z170 Estrogen receptor positive status [ER+]: Secondary | ICD-10-CM

## 2021-09-30 DIAGNOSIS — C50411 Malignant neoplasm of upper-outer quadrant of right female breast: Secondary | ICD-10-CM

## 2021-09-30 SURGERY — AXILLARY LYMPH NODE BIOPSY
Anesthesia: General | Laterality: Right

## 2021-10-02 ENCOUNTER — Ambulatory Visit (HOSPITAL_COMMUNITY)
Admission: RE | Admit: 2021-10-02 | Discharge: 2021-10-02 | Disposition: A | Discharge status: Home / Self Care | Payer: Medicare HMO | Source: Ambulatory Visit | Attending: Hematology and Oncology | Admitting: Hematology and Oncology

## 2021-10-02 ENCOUNTER — Other Ambulatory Visit: Payer: Self-pay

## 2021-10-02 DIAGNOSIS — C50411 Malignant neoplasm of upper-outer quadrant of right female breast: Secondary | ICD-10-CM | POA: Insufficient documentation

## 2021-10-02 DIAGNOSIS — M25531 Pain in right wrist: Secondary | ICD-10-CM | POA: Diagnosis not present

## 2021-10-02 DIAGNOSIS — Z170 Estrogen receptor positive status [ER+]: Secondary | ICD-10-CM | POA: Insufficient documentation

## 2021-10-02 DIAGNOSIS — M79602 Pain in left arm: Secondary | ICD-10-CM | POA: Diagnosis not present

## 2021-10-03 ENCOUNTER — Telehealth: Payer: Self-pay | Admitting: *Deleted

## 2021-10-03 ENCOUNTER — Encounter: Payer: Self-pay | Admitting: *Deleted

## 2021-10-03 NOTE — Telephone Encounter (Signed)
Left message for a return phone call to let her know her xrays were negative and will move forward with CMF and chemo class.

## 2021-10-03 NOTE — Telephone Encounter (Signed)
-----   Message from Nicholas Lose, MD sent at 10/03/2021  7:48 AM EST ----- Please inform her that the xrays were normal VG ----- Message ----- From: Interface, Rad Results In Sent: 10/02/2021  11:57 PM EST To: Nicholas Lose, MD

## 2021-10-03 NOTE — Telephone Encounter (Signed)
Called pt to inform of results. Pt didn't answer and there wasn't a call back.

## 2021-10-07 ENCOUNTER — Other Ambulatory Visit: Payer: Self-pay | Admitting: Hematology and Oncology

## 2021-10-07 ENCOUNTER — Other Ambulatory Visit: Payer: Self-pay

## 2021-10-07 ENCOUNTER — Encounter (HOSPITAL_BASED_OUTPATIENT_CLINIC_OR_DEPARTMENT_OTHER): Payer: Self-pay | Admitting: Surgery

## 2021-10-07 DIAGNOSIS — C50411 Malignant neoplasm of upper-outer quadrant of right female breast: Secondary | ICD-10-CM

## 2021-10-07 MED ORDER — ONDANSETRON HCL 8 MG PO TABS: 8.0000 mg | ORAL_TABLET | Freq: Two times a day (BID) | ORAL | 1 refills | Status: DC | PRN

## 2021-10-07 MED ORDER — LIDOCAINE-PRILOCAINE 2.5-2.5 % EX CREA: TOPICAL_CREAM | CUTANEOUS | 3 refills | Status: DC

## 2021-10-07 MED ORDER — PROCHLORPERAZINE MALEATE 10 MG PO TABS: 10.0000 mg | ORAL_TABLET | Freq: Four times a day (QID) | ORAL | 1 refills | Status: DC | PRN

## 2021-10-07 NOTE — Progress Notes (Signed)
START OFF PATHWAY REGIMEN - Breast   [id number]:CMF (Cyclophosphamide IV + Methotrexate IV + Fluorouracil IV) q21 Days:   A cycle is every 21 days:     Cyclophosphamide      Fluorouracil      Methotrexate   **Always confirm dose/schedule in your pharmacy ordering system**  Patient Characteristics: Postoperative without Neoadjuvant Therapy (Pathologic Staging), Invasive Disease, Adjuvant Therapy, HER2 Negative/Unknown/Equivocal, ER Positive, Node Positive, Node Positive (4+) Therapeutic Status: Postoperative without Neoadjuvant Therapy (Pathologic Staging) AJCC Grade: G2 AJCC N Category: pN2 AJCC M Category: cM0 ER Status: Positive (+) AJCC 8 Stage Grouping: IB HER2 Status: Negative (-) Oncotype Dx Recurrence Score: Not Appropriate AJCC T Category: pT2 PR Status: Positive (+) Adjuvant Therapy Status: No Adjuvant Therapy Received Yet or Changing Initial Adjuvant Regimen due to Tolerance Intent of Therapy: Curative Intent, Discussed with Patient

## 2021-10-13 ENCOUNTER — Other Ambulatory Visit: Payer: Self-pay

## 2021-10-13 ENCOUNTER — Inpatient Hospital Stay: Payer: Medicare HMO

## 2021-10-13 ENCOUNTER — Encounter: Payer: Self-pay | Admitting: Hematology and Oncology

## 2021-10-13 NOTE — Progress Notes (Signed)
Met with patient at registration to introduce myself as Financial Resource Specialist and to offer available resources.  Discussed one-time $1000 [employer] grant and qualifications to assist with personal expenses while going through treatment. Advised what is needed to apply.  Gave her my card if interested in applying and for any additional financial questions or concerns. 

## 2021-10-14 ENCOUNTER — Telehealth: Payer: Self-pay | Admitting: Adult Health

## 2021-10-14 ENCOUNTER — Ambulatory Visit (HOSPITAL_COMMUNITY)
Admission: RE | Admit: 2021-10-14 | Discharge: 2021-10-14 | Disposition: A | Discharge status: Home / Self Care | Payer: Medicare HMO | Source: Ambulatory Visit | Attending: Hematology and Oncology | Admitting: Hematology and Oncology

## 2021-10-14 ENCOUNTER — Encounter: Payer: Self-pay | Admitting: *Deleted

## 2021-10-14 DIAGNOSIS — C50411 Malignant neoplasm of upper-outer quadrant of right female breast: Secondary | ICD-10-CM

## 2021-10-14 DIAGNOSIS — K5730 Diverticulosis of large intestine without perforation or abscess without bleeding: Secondary | ICD-10-CM | POA: Diagnosis not present

## 2021-10-14 DIAGNOSIS — Z170 Estrogen receptor positive status [ER+]: Secondary | ICD-10-CM | POA: Insufficient documentation

## 2021-10-14 DIAGNOSIS — J432 Centrilobular emphysema: Secondary | ICD-10-CM | POA: Diagnosis not present

## 2021-10-14 DIAGNOSIS — J9859 Other diseases of mediastinum, not elsewhere classified: Secondary | ICD-10-CM | POA: Diagnosis not present

## 2021-10-14 DIAGNOSIS — C50919 Malignant neoplasm of unspecified site of unspecified female breast: Secondary | ICD-10-CM | POA: Diagnosis not present

## 2021-10-14 DIAGNOSIS — I700 Atherosclerosis of aorta: Secondary | ICD-10-CM | POA: Diagnosis not present

## 2021-10-14 LAB — POCT I-STAT CREATININE: Creatinine, Ser: 1.4 mg/dL — ABNORMAL HIGH (ref 0.44–1.00)

## 2021-10-14 MED ORDER — IOHEXOL 350 MG/ML SOLN
80.0000 mL | Freq: Once | INTRAVENOUS | Status: AC | PRN
  Administered 2021-10-14: 80 mL via INTRAVENOUS

## 2021-10-14 NOTE — Telephone Encounter (Signed)
Scheduled per sch msg. Called and left msg  

## 2021-10-15 NOTE — Progress Notes (Signed)
Reminded patient to come for preoperative labs via text.

## 2021-10-16 ENCOUNTER — Encounter (HOSPITAL_BASED_OUTPATIENT_CLINIC_OR_DEPARTMENT_OTHER): Payer: Self-pay | Admitting: Surgery

## 2021-10-16 ENCOUNTER — Ambulatory Visit (HOSPITAL_COMMUNITY): Payer: Medicare HMO

## 2021-10-16 ENCOUNTER — Ambulatory Visit (HOSPITAL_BASED_OUTPATIENT_CLINIC_OR_DEPARTMENT_OTHER): Payer: Medicare HMO | Admitting: Anesthesiology

## 2021-10-16 ENCOUNTER — Other Ambulatory Visit: Payer: Self-pay

## 2021-10-16 ENCOUNTER — Ambulatory Visit (HOSPITAL_BASED_OUTPATIENT_CLINIC_OR_DEPARTMENT_OTHER)
Admission: RE | Admit: 2021-10-16 | Discharge: 2021-10-16 | Disposition: A | Discharge status: Home / Self Care | Payer: Medicare HMO | Source: Ambulatory Visit | Attending: Surgery | Admitting: Surgery

## 2021-10-16 ENCOUNTER — Encounter (HOSPITAL_BASED_OUTPATIENT_CLINIC_OR_DEPARTMENT_OTHER)
Admission: RE | Disposition: A | Discharge status: Home / Self Care | Payer: Self-pay | Source: Ambulatory Visit | Attending: Surgery

## 2021-10-16 DIAGNOSIS — Z8673 Personal history of transient ischemic attack (TIA), and cerebral infarction without residual deficits: Secondary | ICD-10-CM | POA: Insufficient documentation

## 2021-10-16 DIAGNOSIS — C50412 Malignant neoplasm of upper-outer quadrant of left female breast: Secondary | ICD-10-CM | POA: Diagnosis not present

## 2021-10-16 DIAGNOSIS — I517 Cardiomegaly: Secondary | ICD-10-CM | POA: Diagnosis not present

## 2021-10-16 DIAGNOSIS — Z452 Encounter for adjustment and management of vascular access device: Secondary | ICD-10-CM | POA: Insufficient documentation

## 2021-10-16 DIAGNOSIS — Z87891 Personal history of nicotine dependence: Secondary | ICD-10-CM | POA: Insufficient documentation

## 2021-10-16 DIAGNOSIS — M1990 Unspecified osteoarthritis, unspecified site: Secondary | ICD-10-CM | POA: Insufficient documentation

## 2021-10-16 DIAGNOSIS — I10 Essential (primary) hypertension: Secondary | ICD-10-CM | POA: Diagnosis not present

## 2021-10-16 DIAGNOSIS — J449 Chronic obstructive pulmonary disease, unspecified: Secondary | ICD-10-CM | POA: Diagnosis not present

## 2021-10-16 DIAGNOSIS — Z95828 Presence of other vascular implants and grafts: Secondary | ICD-10-CM

## 2021-10-16 DIAGNOSIS — C773 Secondary and unspecified malignant neoplasm of axilla and upper limb lymph nodes: Secondary | ICD-10-CM | POA: Insufficient documentation

## 2021-10-16 DIAGNOSIS — K219 Gastro-esophageal reflux disease without esophagitis: Secondary | ICD-10-CM | POA: Diagnosis not present

## 2021-10-16 DIAGNOSIS — C50411 Malignant neoplasm of upper-outer quadrant of right female breast: Secondary | ICD-10-CM | POA: Diagnosis not present

## 2021-10-16 DIAGNOSIS — Z419 Encounter for procedure for purposes other than remedying health state, unspecified: Secondary | ICD-10-CM

## 2021-10-16 DIAGNOSIS — Z170 Estrogen receptor positive status [ER+]: Secondary | ICD-10-CM | POA: Diagnosis not present

## 2021-10-16 DIAGNOSIS — T82898A Other specified complication of vascular prosthetic devices, implants and grafts, initial encounter: Secondary | ICD-10-CM | POA: Diagnosis not present

## 2021-10-16 HISTORY — PX: PORTACATH PLACEMENT: SHX2246

## 2021-10-16 SURGERY — INSERTION, TUNNELED CENTRAL VENOUS DEVICE, WITH PORT
Anesthesia: General | Site: Chest | Laterality: Right

## 2021-10-16 MED ORDER — CHLORHEXIDINE GLUCONATE CLOTH 2 % EX PADS: 6.0000 | MEDICATED_PAD | Freq: Once | CUTANEOUS | Status: DC

## 2021-10-16 MED ORDER — DEXAMETHASONE SODIUM PHOSPHATE 10 MG/ML IJ SOLN
INTRAMUSCULAR | Status: AC
  Filled 2021-10-16: qty 1

## 2021-10-16 MED ORDER — FENTANYL CITRATE (PF) 100 MCG/2ML IJ SOLN
INTRAMUSCULAR | Status: DC | PRN
  Administered 2021-10-16: 50 ug via INTRAVENOUS
  Administered 2021-10-16 (×2): 25 ug via INTRAVENOUS

## 2021-10-16 MED ORDER — OXYCODONE HCL 5 MG PO TABS
5.0000 mg | ORAL_TABLET | Freq: Once | ORAL | Status: AC | PRN
  Administered 2021-10-16: 5 mg via ORAL

## 2021-10-16 MED ORDER — BUPIVACAINE-EPINEPHRINE 0.25% -1:200000 IJ SOLN
INTRAMUSCULAR | Status: DC | PRN
  Administered 2021-10-16: 10 mL

## 2021-10-16 MED ORDER — LIDOCAINE HCL (CARDIAC) PF 100 MG/5ML IV SOSY
PREFILLED_SYRINGE | INTRAVENOUS | Status: DC | PRN
Start: 2021-10-16 — End: 2021-10-16
  Administered 2021-10-16: 60 mg via INTRAVENOUS

## 2021-10-16 MED ORDER — PROMETHAZINE HCL 25 MG/ML IJ SOLN: 6.2500 mg | INTRAMUSCULAR | Status: DC | PRN

## 2021-10-16 MED ORDER — PROPOFOL 10 MG/ML IV BOLUS
INTRAVENOUS | Status: DC | PRN
  Administered 2021-10-16: 200 mg via INTRAVENOUS

## 2021-10-16 MED ORDER — ONDANSETRON HCL 4 MG/2ML IJ SOLN
INTRAMUSCULAR | Status: AC
  Filled 2021-10-16: qty 2

## 2021-10-16 MED ORDER — PROPOFOL 10 MG/ML IV BOLUS
INTRAVENOUS | Status: AC
  Filled 2021-10-16: qty 20

## 2021-10-16 MED ORDER — MIDAZOLAM HCL 5 MG/5ML IJ SOLN
INTRAMUSCULAR | Status: DC | PRN
  Administered 2021-10-16: 2 mg via INTRAVENOUS

## 2021-10-16 MED ORDER — ONDANSETRON HCL 4 MG/2ML IJ SOLN
INTRAMUSCULAR | Status: DC | PRN
  Administered 2021-10-16: 4 mg via INTRAVENOUS

## 2021-10-16 MED ORDER — DEXAMETHASONE SODIUM PHOSPHATE 10 MG/ML IJ SOLN
INTRAMUSCULAR | Status: DC | PRN
  Administered 2021-10-16: 4 mg via INTRAVENOUS

## 2021-10-16 MED ORDER — BUPIVACAINE-EPINEPHRINE (PF) 0.25% -1:200000 IJ SOLN
INTRAMUSCULAR | Status: AC
  Filled 2021-10-16: qty 30

## 2021-10-16 MED ORDER — MIDAZOLAM HCL 2 MG/2ML IJ SOLN
INTRAMUSCULAR | Status: AC
  Filled 2021-10-16: qty 2

## 2021-10-16 MED ORDER — HYDROMORPHONE HCL 1 MG/ML IJ SOLN: 0.2500 mg | INTRAMUSCULAR | Status: DC | PRN

## 2021-10-16 MED ORDER — CEFAZOLIN SODIUM-DEXTROSE 2-4 GM/100ML-% IV SOLN
2.0000 g | INTRAVENOUS | Status: AC
  Administered 2021-10-16: 2 g via INTRAVENOUS

## 2021-10-16 MED ORDER — HEPARIN SOD (PORK) LOCK FLUSH 100 UNIT/ML IV SOLN
INTRAVENOUS | Status: DC | PRN
  Administered 2021-10-16: 500 [IU]

## 2021-10-16 MED ORDER — FENTANYL CITRATE (PF) 100 MCG/2ML IJ SOLN
INTRAMUSCULAR | Status: AC
  Filled 2021-10-16: qty 2

## 2021-10-16 MED ORDER — OXYCODONE HCL 5 MG PO TABS
ORAL_TABLET | ORAL | Status: AC
  Filled 2021-10-16: qty 1

## 2021-10-16 MED ORDER — HEPARIN (PORCINE) IN NACL 2-0.9 UNITS/ML
INTRAMUSCULAR | Status: AC | PRN
  Administered 2021-10-16: 1 via INTRAVENOUS

## 2021-10-16 MED ORDER — OXYCODONE HCL 5 MG PO TABS: 5.0000 mg | ORAL_TABLET | Freq: Four times a day (QID) | ORAL | 0 refills | Status: DC | PRN

## 2021-10-16 MED ORDER — ACETAMINOPHEN 500 MG PO TABS
ORAL_TABLET | ORAL | Status: AC
  Filled 2021-10-16: qty 2

## 2021-10-16 MED ORDER — AMISULPRIDE (ANTIEMETIC) 5 MG/2ML IV SOLN: 10.0000 mg | Freq: Once | INTRAVENOUS | Status: DC | PRN

## 2021-10-16 MED ORDER — ACETAMINOPHEN 500 MG PO TABS
1000.0000 mg | ORAL_TABLET | ORAL | Status: AC
  Administered 2021-10-16: 1000 mg via ORAL

## 2021-10-16 MED ORDER — LACTATED RINGERS IV SOLN: INTRAVENOUS | Status: DC

## 2021-10-16 MED ORDER — OXYCODONE HCL 5 MG/5ML PO SOLN: 5.0000 mg | Freq: Once | ORAL | Status: AC | PRN

## 2021-10-16 SURGICAL SUPPLY — 60 items
ADH SKN CLS APL DERMABOND .7 (GAUZE/BANDAGES/DRESSINGS) ×1
APL PRP STRL LF DISP 70% ISPRP (MISCELLANEOUS) ×1
APL SKNCLS STERI-STRIP NONHPOA (GAUZE/BANDAGES/DRESSINGS)
BAG DECANTER FOR FLEXI CONT (MISCELLANEOUS) ×2 IMPLANT
BENZOIN TINCTURE PRP APPL 2/3 (GAUZE/BANDAGES/DRESSINGS) IMPLANT
BLADE HEX COATED 2.75 (ELECTRODE) ×2 IMPLANT
BLADE SURG 11 STRL SS (BLADE) ×2 IMPLANT
BLADE SURG 15 STRL LF DISP TIS (BLADE) ×1 IMPLANT
BLADE SURG 15 STRL SS (BLADE) ×2
CANISTER SUCT 1200ML W/VALVE (MISCELLANEOUS) IMPLANT
CHLORAPREP W/TINT 26 (MISCELLANEOUS) ×2 IMPLANT
COVER BACK TABLE 60X90IN (DRAPES) ×2 IMPLANT
COVER MAYO STAND STRL (DRAPES) ×2 IMPLANT
COVER PROBE 5X48 (MISCELLANEOUS) ×2
DECANTER SPIKE VIAL GLASS SM (MISCELLANEOUS) IMPLANT
DERMABOND ADVANCED (GAUZE/BANDAGES/DRESSINGS) ×1
DERMABOND ADVANCED .7 DNX12 (GAUZE/BANDAGES/DRESSINGS) ×1 IMPLANT
DRAPE C-ARM 42X72 X-RAY (DRAPES) ×2 IMPLANT
DRAPE LAPAROSCOPIC ABDOMINAL (DRAPES) ×2 IMPLANT
DRAPE UTILITY XL STRL (DRAPES) ×2 IMPLANT
DRSG TEGADERM 2-3/8X2-3/4 SM (GAUZE/BANDAGES/DRESSINGS) IMPLANT
DRSG TEGADERM 4X4.75 (GAUZE/BANDAGES/DRESSINGS) ×2 IMPLANT
ELECT REM PT RETURN 9FT ADLT (ELECTROSURGICAL) ×2
ELECTRODE REM PT RTRN 9FT ADLT (ELECTROSURGICAL) ×1 IMPLANT
GAUZE SPONGE 4X4 12PLY STRL LF (GAUZE/BANDAGES/DRESSINGS) IMPLANT
GLOVE SRG 8 PF TXTR STRL LF DI (GLOVE) ×1 IMPLANT
GLOVE SURG LTX SZ8 (GLOVE) ×2 IMPLANT
GLOVE SURG POLYISO LF SZ7 (GLOVE) ×2 IMPLANT
GLOVE SURG UNDER POLY LF SZ7 (GLOVE) ×4 IMPLANT
GLOVE SURG UNDER POLY LF SZ8 (GLOVE) ×2
GOWN STRL REUS W/ TWL LRG LVL3 (GOWN DISPOSABLE) ×2 IMPLANT
GOWN STRL REUS W/ TWL XL LVL3 (GOWN DISPOSABLE) ×1 IMPLANT
GOWN STRL REUS W/TWL LRG LVL3 (GOWN DISPOSABLE) ×4
GOWN STRL REUS W/TWL XL LVL3 (GOWN DISPOSABLE) ×2
IV KIT MINILOC 20X1 SAFETY (NEEDLE) IMPLANT
KIT CVR 48X5XPRB PLUP LF (MISCELLANEOUS) ×1 IMPLANT
KIT PORT POWER 8FR ISP CVUE (Port) ×2 IMPLANT
NDL SAFETY ECLIPSE 18X1.5 (NEEDLE) IMPLANT
NEEDLE HYPO 18GX1.5 SHARP (NEEDLE)
NEEDLE HYPO 22GX1.5 SAFETY (NEEDLE) IMPLANT
NEEDLE HYPO 25X1 1.5 SAFETY (NEEDLE) ×2 IMPLANT
NEEDLE SPNL 22GX3.5 QUINCKE BK (NEEDLE) IMPLANT
PACK BASIN DAY SURGERY FS (CUSTOM PROCEDURE TRAY) ×2 IMPLANT
PENCIL SMOKE EVACUATOR (MISCELLANEOUS) ×2 IMPLANT
SET SHEATH INTRODUCER 10FR (MISCELLANEOUS) IMPLANT
SHEATH COOK PEEL AWAY SET 9F (SHEATH) IMPLANT
SLEEVE SCD COMPRESS KNEE MED (STOCKING) ×2 IMPLANT
SPONGE T-LAP 4X18 ~~LOC~~+RFID (SPONGE) ×2 IMPLANT
STRIP CLOSURE SKIN 1/2X4 (GAUZE/BANDAGES/DRESSINGS) IMPLANT
SUT MON AB 4-0 PC3 18 (SUTURE) ×2 IMPLANT
SUT PROLENE 2 0 CT2 30 (SUTURE) IMPLANT
SUT PROLENE 2 0 SH DA (SUTURE) ×2 IMPLANT
SUT SILK 2 0 TIES 17X18 (SUTURE)
SUT SILK 2-0 18XBRD TIE BLK (SUTURE) IMPLANT
SUT VICRYL 3-0 CR8 SH (SUTURE) ×2 IMPLANT
SYR 5ML LUER SLIP (SYRINGE) ×2 IMPLANT
SYR CONTROL 10ML LL (SYRINGE) ×2 IMPLANT
TOWEL GREEN STERILE FF (TOWEL DISPOSABLE) ×2 IMPLANT
TUBE CONNECTING 20X1/4 (TUBING) IMPLANT
YANKAUER SUCT BULB TIP NO VENT (SUCTIONS) IMPLANT

## 2021-10-16 NOTE — H&P (Signed)
Chief Complaint: No chief complaint on file.   History of Present Illness: Michele Page is a 65 y.o. female who is seen today for postoperative follow-up. She is 3 weeks status post right breast lumpectomy with right axillary targeted lymph node biopsy and sentinel lymph node mapping. She had multiple positive nodes. She was discussed with the multidisciplinary clinic we discussed both completion node dissection. Both medical and radiation oncology felt that adjuvant therapy with chemotherapy as well as radiation therapy would be equally as good with a lower risk of potential lymphedema. Due to her BMI she does have a significantly higher risk of lymphedema with node dissection with additional radiation and chemotherapy. She will need a Port-A-Cath. We discussed this at great length today of the pros and cons of lymph node dissection and that this was discussed in a multidisciplinary group setting reviewing all pertinent data including her cancer type, location and potential survival benefit which is not significantly improved after lymph node dissection..  Review of Systems: A complete review of systems was obtained from the patient. I have reviewed this information and discussed as appropriate with the patient. See HPI as well for other ROS.    Medical History: Past Medical History:  Diagnosis Date   History of cancer   History of stroke   Hypertension   There is no problem list on file for this patient.  History reviewed. No pertinent surgical history.   No Known Allergies  Current Outpatient Medications on File Prior to Visit  Medication Sig Dispense Refill   allopurinoL (ZYLOPRIM) 100 MG tablet Take 200 mg by mouth once daily   atorvastatin (LIPITOR) 20 MG tablet Take 1 tablet by mouth once daily   labetaloL (TRANDATE) 300 MG tablet   niFEdipine (PROCARDIA-XL) 60 MG (OSM) XL tablet Take 60 mg by mouth 2 (two) times daily   No current facility-administered medications on file prior  to visit.   Family History  Problem Relation Age of Onset   High blood pressure (Hypertension) Mother   Breast cancer Mother    Social History   Tobacco Use  Smoking Status Never  Smokeless Tobacco Never    Social History   Socioeconomic History   Marital status: Married  Tobacco Use   Smoking status: Never   Smokeless tobacco: Never  Vaping Use   Vaping Use: Never used  Substance and Sexual Activity   Alcohol use: Not Currently   Drug use: Never   Objective:   Vitals:  09/26/21 1032  Pulse: 87  Temp: 36.8 C (98.3 F)  SpO2: 96%  Weight: 92.1 kg (203 lb)  Height: 157.5 cm (5\' 2" )   Body mass index is 37.13 kg/m.  Right breast incision clean dry intact. Moderate seroma noted right axilla which is not tender or red.  Labs, Imaging and Diagnostic Testing:  CT scan of abdomen, chest and pelvis pending as well as bone scan.  Assessment and Plan:  Diagnoses and all orders for this visit:  Post-operative state    Discussed the above findings and the conversation held in the multidisciplinary clinic. Plan for Port-A-Cath placement. Risk of bleeding, infection, collapsed lung, major vascular injury, DVT, death, catheter complication, catheter migration, need for other routes of access and potential long-term issues with port placement discussed today. She has agreed to proceed.

## 2021-10-16 NOTE — Interval H&P Note (Signed)
History and Physical Interval Note:  10/16/2021 1:03 PM  Michele Page  has presented today for surgery, with the diagnosis of [place].  The various methods of treatment have been discussed with the patient and family. After consideration of risks, benefits and other options for treatment, the patient has consented to  Procedure(s): INSERTION PORT-A-CATH (N/A) as a surgical intervention.  The patient's history has been reviewed, patient examined, no change in status, stable for surgery.  I have reviewed the patient's chart and labs.  Questions were answered to the patient's satisfaction.     [facility]

## 2021-10-16 NOTE — Discharge Instructions (Addendum)
PORT-A-CATH: POST OP INSTRUCTIONS  Always review your discharge instruction sheet given to you by the facility where your surgery was performed.   A prescription for pain medication may be given to you upon discharge. Take your pain medication as prescribed, if needed. If narcotic pain medicine is not needed, then you make take acetaminophen (Tylenol) or ibuprofen (Advil) as needed.  Take your usually prescribed medications unless otherwise directed. If you need a refill on your pain medication, please contact our office. All narcotic pain medicine now requires a paper prescription.  Phoned in and fax refills are no longer allowed by law.  Prescriptions will not be filled after 5 pm or on weekends.  You should follow a light diet for the remainder of the day after your procedure. Most patients will experience some mild swelling and/or bruising in the area of the incision. It may take several days to resolve. It is common to experience some constipation if taking pain medication after surgery. Increasing fluid intake and taking a stool softener (such as Colace) will usually help or prevent this problem from occurring. A mild laxative (Milk of Magnesia or Miralax) should be taken according to package directions if there are no bowel movements after 48 hours.  Unless discharge instructions indicate otherwise, you may remove your bandages 48 hours after surgery, and you may shower at that time. You may have steri-strips (small white skin tapes) in place directly over the incision.  These strips should be left on the skin for 7-10 days.  If your surgeon used Dermabond (skin glue) on the incision, you may shower in 24 hours.  The glue will flake off over the next 2-3 weeks.  If your port is left accessed at the end of surgery (needle left in port), the dressing cannot get wet and should only by changed by a healthcare professional. When the port is no longer accessed (when the needle has been removed),  follow step 7.   ACTIVITIES:  Limit activity involving your arms for the next 72 hours. Do no strenuous exercise or activity for 1 week. You may drive when you are no longer taking prescription pain medication, you can comfortably wear a seatbelt, and you can maneuver your car. 10.You may need to see your doctor in the office for a follow-up appointment.  Please       check with your doctor.  11.When you receive a new Port-a-Cath, you will get a product guide and        ID card.  Please keep them in case you need them.  WHEN TO CALL YOUR DOCTOR [phone]): Fever over 101.0 Chills Continued bleeding from incision Increased redness and tenderness at the site Shortness of breath, difficulty breathing   The clinic staff is available to answer your questions during regular business hours. Please don't hesitate to call and ask to speak to one of the nurses or medical assistants for clinical concerns. If you have a medical emergency, go to the nearest emergency room or call 911.  A surgeon from [facility] is always on call at the hospital.     For further information, please visit [email or web address]    No Tylenol until 5:32 pm   Post Anesthesia Home Care Instructions  Activity: Get plenty of rest for the remainder of the day. A responsible individual must stay with you for 24 hours following the procedure.  For the next 24 hours, DO NOT: -Drive a car -Paediatric nurse -Drink alcoholic beverages -Take  any medication unless instructed by your physician -Make any legal decisions or sign important papers.  Meals: Start with liquid foods such as gelatin or soup. Progress to regular foods as tolerated. Avoid greasy, spicy, heavy foods. If nausea and/or vomiting occur, drink only clear liquids until the nausea and/or vomiting subsides. Call your physician if vomiting continues.  Special Instructions/Symptoms: Your throat may feel dry or sore from the  anesthesia or the breathing tube placed in your throat during surgery. If this causes discomfort, gargle with warm salt water. The discomfort should disappear within 24 hours.  If you had a scopolamine patch placed behind your ear for the management of post- operative nausea and/or vomiting:  1. The medication in the patch is effective for 72 hours, after which it should be removed.  Wrap patch in a tissue and discard in the trash. Wash hands thoroughly with soap and water. 2. You may remove the patch earlier than 72 hours if you experience unpleasant side effects which may include dry mouth, dizziness or visual disturbances. 3. Avoid touching the patch. Wash your hands with soap and water after contact with the patch.    Oxycodone 5 mg given at 3:02 pm

## 2021-10-16 NOTE — Anesthesia Postprocedure Evaluation (Signed)
Anesthesia Post Note  Patient: Michele Page  Procedure(s) Performed: INSERTION PORT-A-CATH (Right: Chest)     Patient location during evaluation: PACU Anesthesia Type: General Level of consciousness: awake and alert Pain management: pain level controlled Vital Signs Assessment: post-procedure vital signs reviewed and stable Respiratory status: spontaneous breathing, nonlabored ventilation and respiratory function stable Cardiovascular status: blood pressure returned to baseline and stable Postop Assessment: no apparent nausea or vomiting Anesthetic complications: no   No notable events documented.  Last Vitals:  Vitals:   10/16/21 1445 10/16/21 1516  BP: (!) 147/77 (!) 147/94  Pulse: 80 84  Resp: 19 16  Temp:  36.7 C  SpO2: 95% 96%    Last Pain:  Vitals:   10/16/21 1516  TempSrc:   PainSc: [place]

## 2021-10-16 NOTE — Op Note (Signed)
Preoperative diagnosis: PAC needed for chemotherapy / poor venous access  Postoperative diagnosis: Same  Procedure: Portacath Placement with U/S guidance and C arm guidance right IJ approach 8 F  Surgeon: Turner Daniels, MD, FACS  Anesthesia: General and 0.25 % marcaine with epinephrine  Clinical History and Indications: The patient is getting ready to begin chemotherapy for her cancer. She  needs a Port-A-Cath for venous access. Risk of bleeding, infection,  Collapse lung,  Death,  DVT,  Organ injury,  Mediastinal injury,  Injury to heart,  Injury to blood vessels,  Nerves,  Migration of catheter,  Embolization of catheter and the need for more surgery.  Description of Procedure: I have seen the patient in the holding area and confirmed the plans for the procedure as noted above. I reviewed the risks and complications again and the patient has no further questions. She wishes to proceed.   The patient was then taken to the operating room. After satisfactory general  anesthesia had been obtained the upper chest and lower neck were prepped and draped as a sterile field. The timeout was done.  The right internal jugular vein  was entered under U/S guidance  and the guidewire threaded into the superior vena cava right atrial area under fluoroscopic guidance. An incision was then made on the anterior chest wall and a subcutaneous pocket fashioned for the port reservoir.  The port tubing was then brought through a subcutaneous tunnel from the port site to the guidewire site.  The port and catheter were attached, locked  and flushed. The catheter was measured and cut to appropriate length.The dilator and peel-away sheath were then advanced over the guidewire while monitoring this with fluoroscopy. The guidewire and dilator were removed and the tubing threaded to approximately 22 cm. The peel-away sheath was then removed. The catheter aspirated and flushed easily. Using fluoroscopy the tip was in the  superior vena cava right atrial junction area. It aspirated and flushed easily. That aspirated and flushed easily.  The reservoir was secured to the fascia with 2 sutures of 2-0 Prolene. A final check with fluoroscopy was done to make sure we had no kinks and good positioning of the tip of the catheter. Everything appeared to be okay. The catheter was aspirated, flushed with dilute heparin and then concentrated aqueous heparin.  The incision was then closed with interrupted 3-0 Vicryl, and 4-0 Monocryl subcuticular with Dermabond on the skin.  The port was left accessed for treatment tomorrow.   There were no operative complications. Estimated blood loss was minimal. All counts were correct. The patient tolerated the procedure well.  Turner Daniels, MD, FACS

## 2021-10-16 NOTE — Anesthesia Procedure Notes (Signed)
Procedure Name: LMA Insertion Date/Time: 10/16/2021 1:17 PM Performed by: Glory Buff, CRNA Pre-anesthesia Checklist: Patient identified, Emergency Drugs available, Suction available and Patient being monitored Patient Re-evaluated:Patient Re-evaluated prior to induction Oxygen Delivery Method: Circle system utilized Preoxygenation: Pre-oxygenation with 100% oxygen Induction Type: IV induction LMA: LMA inserted LMA Size: 4.0 Number of attempts: 1 Placement Confirmation: positive ETCO2 Tube secured with: Tape Dental Injury: Teeth and Oropharynx as per pre-operative assessment

## 2021-10-16 NOTE — Transfer of Care (Signed)
Immediate Anesthesia Transfer of Care Note  Patient: Michele Page  Procedure(s) Performed: INSERTION PORT-A-CATH (Right: Chest)  Patient Location: PACU  Anesthesia Type:General  Level of Consciousness: drowsy, patient cooperative and responds to stimulation  Airway & Oxygen Therapy: Patient Spontanous Breathing and Patient connected to face mask oxygen  Post-op Assessment: Report given to RN and Post -op Vital signs reviewed and stable  Post vital signs: Reviewed and stable  Last Vitals:  Vitals Value Taken Time  BP 125/72 10/16/21 1427  Temp    Pulse 78 10/16/21 1428  Resp 11 10/16/21 1428  SpO2 98 % 10/16/21 1428  Vitals shown include unvalidated device data.  Last Pain:  Vitals:   10/16/21 1127  TempSrc: Oral  PainSc: 0-No pain      Patients Stated Pain Goal: 3 (82/41/75 3010)  Complications: No notable events documented.

## 2021-10-16 NOTE — Anesthesia Preprocedure Evaluation (Signed)
Anesthesia Evaluation  Patient identified by MRN, date of birth, ID band Patient awake    Reviewed: Allergy & Precautions, NPO status , Patient's Chart, lab work & pertinent test results  Airway Mallampati: II  TM Distance: >3 FB Neck ROM: Full    Dental no notable dental hx.    Pulmonary neg pulmonary ROS, COPD, former smoker,    Pulmonary exam normal breath sounds clear to auscultation       Cardiovascular hypertension, Pt. on medications Normal cardiovascular exam Rhythm:Regular Rate:Normal     Neuro/Psych MS CVA, Residual Symptoms negative psych ROS   GI/Hepatic Neg liver ROS, GERD  ,  Endo/Other  negative endocrine ROS  Renal/GU Renal InsufficiencyRenal disease  negative genitourinary   Musculoskeletal  (+) Arthritis , Osteoarthritis,    Abdominal (+) + obese,   Peds negative pediatric ROS (+)  Hematology negative hematology ROS (+)   Anesthesia Other Findings   Reproductive/Obstetrics negative OB ROS                             Anesthesia Physical  Anesthesia Plan  ASA: III  Anesthesia Plan: General   Post-op Pain Management: GA combined w/ Regional for post-op pain   Induction: Intravenous  PONV Risk Score and Plan: 3 and Ondansetron, Dexamethasone, Midazolam and Treatment may vary due to age or medical condition  Airway Management Planned: LMA  Additional Equipment:   Intra-op Plan:   Post-operative Plan: Extubation in OR  Informed Consent: I have reviewed the patients History and Physical, chart, labs and discussed the procedure including the risks, benefits and alternatives for the proposed anesthesia with the patient or authorized representative who has indicated his/her understanding and acceptance.     Dental advisory given  Plan Discussed with: CRNA and Surgeon  Anesthesia Plan Comments:         Anesthesia Quick Evaluation

## 2021-10-16 NOTE — Progress Notes (Signed)
Patient Care Team: [name], NP as PCP - General (Geriatric Medicine) Lorretta Harp, MD as PCP - Cardiology (Cardiology) Nicholas Lose, MD as Consulting Physician (Oncology) Erroll Luna, MD as Consulting Physician (General Surgery) Thea Silversmith, MD as Consulting Physician (Radiation Oncology) Mauro Kaufmann, RN as Oncology Nurse Navigator Rockwell Germany, RN as Oncology Nurse Navigator  DIAGNOSIS:    ICD-10-CM   1. Malignant neoplasm of upper-outer quadrant of right breast in female, estrogen receptor positive ([name])  C50.411    Z17.0       SUMMARY OF ONCOLOGIC HISTORY: Oncology History  Breast cancer of upper-outer quadrant of left female breast ([place])  03/31/2016 Initial Diagnosis   Screening detected left breast mass, 2 nodules, 1.9 x 1.6 x 0.8 cm= fat necrosis; 8 x 7 x 7 mm= grade 2 IDC ER 100%, PR 100%, HER-2 negative ratio 1.39, Ki-67 20%   04/17/2016 Surgery   Left lumpectomy ([name]): IDC grade 2, 1.3 cm, with DCIS, margins negative, 0/4 lymph nodes negative, T1 cN0 stage IA pathologic stage, ER 100%, PR 100%, HER-2 negative ratio 1.39, Ki-67 20% Oncotype DX score 20, 13% ROR, intermediate risk   04/17/2016 Oncotype testing   Recurrence score: 20; ROR 15% (intermediate risk)    05/27/2016 - 07/13/2016 Radiation Therapy   Adjuvant radiation therapy [facility]): Left breast treated with breath hold to 50.4 Gy in 28 fractions at 1.8 Gy/fraction.  Left breast boosted to 10 Gy in 5 fractions at 2 Gy/fraction   07/23/2016 -  Anti-estrogen oral therapy   Anastrozole 1 mg switched to letrozole 04/22/2017 due to hair loss, switch to tamoxifen 10/22/2017 due to hair loss from letrozole as well   Malignant neoplasm of upper-outer quadrant of right breast in female, estrogen receptor positive ([name])  08/04/2021 Initial Diagnosis   Malignant neoplasm of upper-outer quadrant of right breast in female, estrogen receptor positive ([name])    Relapse/Recurrence   2.2 cm ILC ER  100%, PR 90%, Her 2 Neg, Ki 67: 25%, Size 2.5 cm   08/04/2021 Cancer Staging   Staging form: Breast, AJCC 8th Edition - Clinical: Stage IB (cT2, cN0, cM0, G2, ER+, PR+, HER2-) - Signed by Nicholas Lose, MD on 08/04/2021 Stage prefix: Initial diagnosis Histologic grading system: 3 grade system    09/02/2021 Surgery   Right lumpectomy: Grade 2 ILC 3.1 cm, margins negative, LVI present, 4/6 lymph nodes macrometastases, 2 lymph nodes isolated tumor cells, ER 100%, PR 90%, Ki-67 25%, HER2 negative   10/14/2021 -  Chemotherapy   Patient is on Treatment Plan : BREAST Adjuvant CMF IV q21d       CHIEF COMPLIANT: Follow-up of left breast cancer on anastrozole  INTERVAL HISTORY: Michele Page is a 65 y.o. with above-mentioned history of left breast cancer treated with lumpectomy, radiation, and who is currently on anastrozole. She presents to the clinic today for follow-up and toxicity check.  Her CT scans did not show any evidence of metastatic disease.  ALLERGIES:  has No Known Allergies.  MEDICATIONS:  Current Outpatient Medications  Medication Sig Dispense Refill   anastrozole (ARIMIDEX) 1 MG tablet Take 1 tablet (1 mg total) by mouth daily. 90 tablet 3   aspirin EC 81 MG tablet Take 1 tablet (81 mg total) by mouth daily. 30 tablet 6   atorvastatin (LIPITOR) 20 MG tablet TAKE 1 TABLET EVERY DAY 90 tablet 1   eszopiclone (LUNESTA) 2 MG TABS tablet Take one tablet by mouth at bedtime as needed for sleep (  take immediately before bedtime) 30 tablet 0   ibuprofen (ADVIL) 800 MG tablet Take 1 tablet (800 mg total) by mouth every 8 (eight) hours as needed. 30 tablet 0   labetalol (NORMODYNE) 300 MG tablet TAKE 2 TABLETS THREE TIMES DAILY 540 tablet 1   lidocaine-prilocaine (EMLA) cream Apply to affected area once 30 g 3   NIFEdipine (PROCARDIA XL/NIFEDICAL XL) 60 MG 24 hr tablet Take 1 tablet (60 mg total) by mouth 2 (two) times daily. 180 tablet 3   ondansetron (ZOFRAN) 8 MG tablet Take 1 tablet (8  mg total) by mouth 2 (two) times daily as needed for refractory nausea / vomiting. Start on day 3 after chemotherapy. 30 tablet 1   oxyCODONE (OXY IR/ROXICODONE) 5 MG immediate release tablet Take 1 tablet (5 mg total) by mouth every 6 (six) hours as needed for severe pain. 15 tablet 0   prochlorperazine (COMPAZINE) 10 MG tablet Take 1 tablet (10 mg total) by mouth every 6 (six) hours as needed (Nausea or vomiting). 30 tablet 1   No current facility-administered medications for this visit.    PHYSICAL EXAMINATION: ECOG PERFORMANCE STATUS: 1 - Symptomatic but completely ambulatory  Vitals:   10/17/21 1257  BP: (!) 148/75  Pulse: 80  Resp: 18  Temp: 97.7 F (36.5 C)  SpO2: 98%   Filed Weights   10/17/21 1257  Weight: 209 lb 12.8 oz (95.2 kg)    BREAST: No palpable masses or nodules in either right or left breasts. No palpable axillary supraclavicular or infraclavicular adenopathy no breast tenderness or nipple discharge. (exam performed in the presence of a chaperone)  LABORATORY DATA:  I have reviewed the data as listed CMP Latest Ref Rng & Units 10/14/2021 08/12/2021 04/18/2021  Glucose 70 - 99 mg/dL - 126(H) 96  BUN 8 - 23 mg/dL - 27(H) 17  Creatinine 0.44 - 1.00 mg/dL 1.40(H) 2.07(H) 1.61(H)  Sodium 135 - 145 mmol/L - 144 140  Potassium 3.5 - 5.1 mmol/L - 3.6 4.0  Chloride 98 - 111 mmol/L - 109 105  CO2 22 - 32 mmol/L - 22 22  Calcium 8.9 - 10.3 mg/dL - 9.8 9.6  Total Protein 6.5 - 8.1 g/dL - 7.2 6.7  Total Bilirubin 0.3 - 1.2 mg/dL - 0.5 0.5  Alkaline Phos 38 - 126 U/L - 109 -  AST 15 - 41 U/L - 23 28  ALT 0 - 44 U/L - 17 25    Lab Results  Component Value Date   WBC 10.1 10/17/2021   HGB 11.2 (L) 10/17/2021   HCT 32.7 (L) 10/17/2021   MCV 91.9 10/17/2021   PLT 272 10/17/2021   NEUTROABS 7.3 10/17/2021    ASSESSMENT & PLAN:  Malignant neoplasm of upper-outer quadrant of right breast in female, estrogen receptor positive ([name]) 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: IDC  grade 2, 1.3 cm, with DCIS, margins negative, 0/4 lymph nodes negative, T1 cN0 stage IA pathologic stage, ER 100%, PR 100%, HER-2 negative ratio 1.39, Ki-67 20% (Originally 2 nodules were detected on screening mammogram 1.9 cm= fat necrosis and 8 mm IDC) Oncotype DX score 20, 13% risk of recurrence, intermediate risk Adjuvant radiation therapy started 07/20/2017completed 07/13/2016     09/02/2021:Right lumpectomy: Grade 2 ILC 3.1 cm, margins negative, LVI present, 4/6 lymph nodes macrometastases, 2 lymph nodes isolated tumor cells, ER 100%, PR 90%, Ki-67 25%, HER2 negative   Treatment plan: 1.  Based on for enlarged lymph nodes, recommended systemic chemotherapy with CMF x8 cycles 2. adjuvant  radiation therapy 3.  For the continued antiestrogen therapy (previously anastrozole started 07/23/2016 switched to letrozole 2018 switched to tamoxifen 2018, switched back to anastrozole) hair loss was the main problem, may consider exemestane ------------------------------------------------------------------------------------------------------------------------------- 10/15/2021: CT CAP: No metastatic disease.  Descending and sigmoid colon diverticulosis, several tiny pulmonary nodules less than 5 mm, 2.1 cm left thyroid nodule  Current treatment: Cycle 1 CMF Return to clinic in 1 week for toxicity check    No orders of the defined types were placed in this encounter.  The patient has a good understanding of the overall plan. she agrees with it. she will call with any problems that may develop before the next visit here.  Total time spent: 20 mins including face to face time and time spent for planning, charting and coordination of care  Rulon Eisenmenger, MD, MPH 10/17/2021  I, Thana Ates, am acting as scribe for Dr. Nicholas Lose.  I have reviewed the above documentation for accuracy and completeness, and I agree with the above.

## 2021-10-17 ENCOUNTER — Ambulatory Visit: Payer: Medicare HMO | Admitting: Hematology and Oncology

## 2021-10-17 ENCOUNTER — Inpatient Hospital Stay: Payer: Medicare HMO | Attending: Hematology and Oncology

## 2021-10-17 ENCOUNTER — Inpatient Hospital Stay (HOSPITAL_BASED_OUTPATIENT_CLINIC_OR_DEPARTMENT_OTHER): Payer: Medicare HMO | Admitting: Hematology and Oncology

## 2021-10-17 ENCOUNTER — Inpatient Hospital Stay: Payer: Medicare HMO

## 2021-10-17 ENCOUNTER — Encounter: Payer: Self-pay | Admitting: *Deleted

## 2021-10-17 ENCOUNTER — Other Ambulatory Visit: Payer: Medicare HMO

## 2021-10-17 DIAGNOSIS — Z95828 Presence of other vascular implants and grafts: Secondary | ICD-10-CM

## 2021-10-17 DIAGNOSIS — C50411 Malignant neoplasm of upper-outer quadrant of right female breast: Secondary | ICD-10-CM | POA: Diagnosis not present

## 2021-10-17 DIAGNOSIS — Z170 Estrogen receptor positive status [ER+]: Secondary | ICD-10-CM | POA: Insufficient documentation

## 2021-10-17 DIAGNOSIS — Z5111 Encounter for antineoplastic chemotherapy: Secondary | ICD-10-CM | POA: Diagnosis not present

## 2021-10-17 LAB — CMP (CANCER CENTER ONLY)
ALT: 23 U/L (ref 0–44)
AST: 23 U/L (ref 15–41)
Albumin: 4.1 g/dL (ref 3.5–5.0)
Alkaline Phosphatase: 118 U/L (ref 38–126)
Anion gap: 11 (ref 5–15)
BUN: 25 mg/dL — ABNORMAL HIGH (ref 8–23)
CO2: 24 mmol/L (ref 22–32)
Calcium: 9.5 mg/dL (ref 8.9–10.3)
Chloride: 106 mmol/L (ref 98–111)
Creatinine: 1.33 mg/dL — ABNORMAL HIGH (ref 0.44–1.00)
GFR, Estimated: 44 mL/min — ABNORMAL LOW (ref >60)
Glucose, Bld: 95 mg/dL (ref 70–99)
Potassium: 3.9 mmol/L (ref 3.5–5.1)
Sodium: 141 mmol/L (ref 135–145)
Total Bilirubin: 0.4 mg/dL (ref 0.3–1.2)
Total Protein: 7.3 g/dL (ref 6.5–8.1)

## 2021-10-17 LAB — CBC WITH DIFFERENTIAL (CANCER CENTER ONLY)
Abs Immature Granulocytes: 0.04 10*3/uL (ref 0.00–0.07)
Basophils Absolute: 0 10*3/uL (ref 0.0–0.1)
Basophils Relative: 0 %
Eosinophils Absolute: 0.1 10*3/uL (ref 0.0–0.5)
Eosinophils Relative: 1 %
HCT: 32.7 % — ABNORMAL LOW (ref 36.0–46.0)
Hemoglobin: 11.2 g/dL — ABNORMAL LOW (ref 12.0–15.0)
Immature Granulocytes: 0 %
Lymphocytes Relative: 18 %
Lymphs Abs: 1.8 10*3/uL (ref 0.7–4.0)
MCH: 31.5 pg (ref 26.0–34.0)
MCHC: 34.3 g/dL (ref 30.0–36.0)
MCV: 91.9 fL (ref 80.0–100.0)
Monocytes Absolute: 0.9 10*3/uL (ref 0.1–1.0)
Monocytes Relative: 9 %
Neutro Abs: 7.3 10*3/uL (ref 1.7–7.7)
Neutrophils Relative %: 72 %
Platelet Count: 272 10*3/uL (ref 150–400)
RBC: 3.56 MIL/uL — ABNORMAL LOW (ref 3.87–5.11)
RDW: 13.3 % (ref 11.5–15.5)
WBC Count: 10.1 10*3/uL (ref 4.0–10.5)
nRBC: 0 % (ref 0.0–0.2)

## 2021-10-17 MED ORDER — SODIUM CHLORIDE 0.9 % IV SOLN
600.0000 mg/m2 | Freq: Once | INTRAVENOUS | Status: AC
  Administered 2021-10-17: 1200 mg via INTRAVENOUS
  Filled 2021-10-17: qty 60

## 2021-10-17 MED ORDER — METHOTREXATE SODIUM (PF) CHEMO INJECTION 250 MG/10ML
40.0000 mg/m2 | Freq: Once | INTRAMUSCULAR | Status: AC
  Administered 2021-10-17: 80 mg via INTRAVENOUS
  Filled 2021-10-17: qty 3.2

## 2021-10-17 MED ORDER — SODIUM CHLORIDE 0.9% FLUSH
10.0000 mL | INTRAVENOUS | Status: DC | PRN
  Administered 2021-10-17: 10 mL

## 2021-10-17 MED ORDER — HEPARIN SOD (PORK) LOCK FLUSH 100 UNIT/ML IV SOLN
500.0000 [IU] | Freq: Once | INTRAVENOUS | Status: AC | PRN
  Administered 2021-10-17: 500 [IU]

## 2021-10-17 MED ORDER — SODIUM CHLORIDE 0.9 % IV SOLN: Freq: Once | INTRAVENOUS | Status: AC

## 2021-10-17 MED ORDER — SODIUM CHLORIDE 0.9% FLUSH
10.0000 mL | INTRAVENOUS | Status: DC | PRN
  Administered 2021-10-17: 10 mL via INTRAVENOUS

## 2021-10-17 MED ORDER — FLUOROURACIL CHEMO INJECTION 2.5 GM/50ML
600.0000 mg/m2 | Freq: Once | INTRAVENOUS | Status: AC
  Administered 2021-10-17: 1200 mg via INTRAVENOUS
  Filled 2021-10-17: qty 24

## 2021-10-17 MED ORDER — PALONOSETRON HCL INJECTION 0.25 MG/5ML
0.2500 mg | Freq: Once | INTRAVENOUS | Status: AC
  Administered 2021-10-17: 0.25 mg via INTRAVENOUS
  Filled 2021-10-17: qty 5

## 2021-10-17 MED ORDER — SODIUM CHLORIDE 0.9 % IV SOLN
10.0000 mg | Freq: Once | INTRAVENOUS | Status: AC
  Administered 2021-10-17: 10 mg via INTRAVENOUS
  Filled 2021-10-17: qty 10

## 2021-10-17 NOTE — Patient Instructions (Signed)
[facility]  Discharge Instructions: Thank you for choosing [facility] to provide your oncology and hematology care.   If you have a lab appointment with the [facility], please go directly to the [facility] and check in at the registration area.   Wear comfortable clothing and clothing appropriate for easy access to any Portacath or PICC line.   We strive to give you quality time with your provider. You may need to reschedule your appointment if you arrive late (15 or more minutes).  Arriving late affects you and other patients whose appointments are after yours.  Also, if you miss three or more appointments without notifying the office, you may be dismissed from the clinic at the provider's discretion.      For prescription refill requests, have your pharmacy contact our office and allow 72 hours for refills to be completed.    Today you received the following chemotherapy and/or immunotherapy agents   Cyclophosphamide (Cytoxan) , Methotrexate, Fluorouracil, 5-FU    To help prevent nausea and vomiting after your treatment, we encourage you to take your nausea medication as directed.  BELOW ARE SYMPTOMS THAT SHOULD BE REPORTED IMMEDIATELY: *FEVER GREATER THAN 100.4 F (38 C) OR HIGHER *CHILLS OR SWEATING *NAUSEA AND VOMITING THAT IS NOT CONTROLLED WITH YOUR NAUSEA MEDICATION *UNUSUAL SHORTNESS OF BREATH *UNUSUAL BRUISING OR BLEEDING *URINARY PROBLEMS (pain or burning when urinating, or frequent urination) *BOWEL PROBLEMS (unusual diarrhea, constipation, pain near the anus) TENDERNESS IN MOUTH AND THROAT WITH OR WITHOUT PRESENCE OF ULCERS (sore throat, sores in mouth, or a toothache) UNUSUAL RASH, SWELLING OR PAIN  UNUSUAL VAGINAL DISCHARGE OR ITCHING   Items with * indicate a potential emergency and should be followed up as soon as possible or go to the Emergency Department if any problems should occur.  Please show the CHEMOTHERAPY  ALERT CARD or IMMUNOTHERAPY ALERT CARD at check-in to the Emergency Department and triage nurse.  Should you have questions after your visit or need to cancel or reschedule your appointment, please contact [facility]  Dept: [phone]  and follow the prompts.  Office hours are 8:00 a.m. to 4:30 p.m. Monday - Friday. Please note that voicemails left after 4:00 p.m. may not be returned until the following business day.  We are closed weekends and major holidays. You have access to a nurse at all times for urgent questions. Please call the main number to the clinic Dept: [phone] and follow the prompts.   For any non-urgent questions, you may also contact your provider using MyChart. We now offer e-Visits for anyone 71 and older to request care online for non-urgent symptoms. For details visit [email or web address].   Also download the MyChart app! Go to the app store, search "MyChart", open the app, select [facility], and log in with your MyChart username and password.  Due to Covid, a mask is required upon entering the hospital/clinic. If you do not have a mask, one will be given to you upon arrival. For doctor visits, patients may have 1 support person aged 7 or older with them. For treatment visits, patients cannot have anyone with them due to current Covid guidelines and our immunocompromised population.   Cyclophosphamide (Cytoxan) Injection What is this medication? CYCLOPHOSPHAMIDE (sye kloe FOSS fa mide) is a chemotherapy drug. It slows the growth of cancer cells. This medicine is used to treat many types of cancer like lymphoma, myeloma, leukemia, breast cancer, and ovarian cancer, to  name a few. This medicine may be used for other purposes; ask your health care provider or pharmacist if you have questions. COMMON BRAND NAME(S): Cytoxan, Neosar What should I tell my care team before I take this medication? They need to know if you have any of these  conditions: heart disease history of irregular heartbeat infection kidney disease liver disease low blood counts, like white cells, platelets, or red blood cells on hemodialysis recent or ongoing radiation therapy scarring or thickening of the lungs trouble passing urine an unusual or allergic reaction to cyclophosphamide, other medicines, foods, dyes, or preservatives pregnant or trying to get pregnant breast-feeding How should I use this medication? This drug is usually given as an injection into a vein or muscle or by infusion into a vein. It is administered in a hospital or clinic by a specially trained health care professional. Talk to your pediatrician regarding the use of this medicine in children. Special care may be needed. Overdosage: If you think you have taken too much of this medicine contact a poison control center or emergency room at once. NOTE: This medicine is only for you. Do not share this medicine with others. What if I miss a dose? It is important not to miss your dose. Call your doctor or health care professional if you are unable to keep an appointment. What may interact with this medication? amphotericin B azathioprine certain antivirals for HIV or hepatitis certain medicines for blood pressure, heart disease, irregular heart beat certain medicines that treat or prevent blood clots like warfarin certain other medicines for cancer cyclosporine etanercept indomethacin medicines that relax muscles for surgery medicines to increase blood counts metronidazole This list may not describe all possible interactions. Give your health care provider a list of all the medicines, herbs, non-prescription drugs, or dietary supplements you use. Also tell them if you smoke, drink alcohol, or use illegal drugs. Some items may interact with your medicine. What should I watch for while using this medication? Your condition will be monitored carefully while you are receiving  this medicine. You may need blood work done while you are taking this medicine. Drink water or other fluids as directed. Urinate often, even at night. Some products may contain alcohol. Ask your health care professional if this medicine contains alcohol. Be sure to tell all health care professionals you are taking this medicine. Certain medicines, like metronidazole and disulfiram, can cause an unpleasant reaction when taken with alcohol. The reaction includes flushing, headache, nausea, vomiting, sweating, and increased thirst. The reaction can last from 30 minutes to several hours. Do not become pregnant while taking this medicine or for 1 year after stopping it. Women should inform their health care professional if they wish to become pregnant or think they might be pregnant. Men should not father a child while taking this medicine and for 4 months after stopping it. There is potential for serious side effects to an unborn child. Talk to your health care professional for more information. Do not breast-feed an infant while taking this medicine or for 1 week after stopping it. This medicine has caused ovarian failure in some women. This medicine may make it more difficult to get pregnant. Talk to your health care professional if you are concerned about your fertility. This medicine has caused decreased sperm counts in some men. This may make it more difficult to father a child. Talk to your health care professional if you are concerned about your fertility. Call your health care professional for  advice if you get a fever, chills, or sore throat, or other symptoms of a cold or flu. Do not treat yourself. This medicine decreases your body's ability to fight infections. Try to avoid being around people who are sick. Avoid taking medicines that contain aspirin, acetaminophen, ibuprofen, naproxen, or ketoprofen unless instructed by your health care professional. These medicines may hide a fever. Talk to your  health care professional about your risk of cancer. You may be more at risk for certain types of cancer if you take this medicine. If you are going to need surgery or other procedure, tell your health care professional that you are using this medicine. Be careful brushing or flossing your teeth or using a toothpick because you may get an infection or bleed more easily. If you have any dental work done, tell your dentist you are receiving this medicine. What side effects may I notice from receiving this medication? Side effects that you should report to your doctor or health care professional as soon as possible: allergic reactions like skin rash, itching or hives, swelling of the face, lips, or tongue breathing problems nausea, vomiting signs and symptoms of bleeding such as bloody or black, tarry stools; red or dark brown urine; spitting up blood or brown material that looks like coffee grounds; red spots on the skin; unusual bruising or bleeding from the eyes, gums, or nose signs and symptoms of heart failure like fast, irregular heartbeat, sudden weight gain; swelling of the ankles, feet, hands signs and symptoms of infection like fever; chills; cough; sore throat; pain or trouble passing urine signs and symptoms of kidney injury like trouble passing urine or change in the amount of urine signs and symptoms of liver injury like dark yellow or brown urine; general ill feeling or flu-like symptoms; light-colored stools; loss of appetite; nausea; right upper belly pain; unusually weak or tired; yellowing of the eyes or skin Side effects that usually do not require medical attention (report to your doctor or health care professional if they continue or are bothersome): confusion decreased hearing diarrhea facial flushing hair loss headache loss of appetite missed menstrual periods signs and symptoms of low red blood cells or anemia such as unusually weak or tired; feeling faint or lightheaded;  falls skin discoloration This list may not describe all possible side effects. Call your doctor for medical advice about side effects. You may report side effects to FDA at 1-800-FDA-1088. Where should I keep my medication? This drug is given in a hospital or clinic and will not be stored at home. NOTE: This sheet is a summary. It may not cover all possible information. If you have questions about this medicine, talk to your doctor, pharmacist, or health care provider.  2022 Elsevier/Gold Standard (2021-07-22 00:00:00)  Methotrexate Injection What is this medication? METHOTREXATE (METH oh TREX ate) treats inflammatory conditions such as arthritis and psoriasis. It works by decreasing inflammation, which can reduce pain and prevent long-term injury to the joints and skin. It may also be used to treat some types of cancer. It works by slowing down the growth of cancer cells. This medicine may be used for other purposes; ask your health care provider or pharmacist if you have questions. What should I tell my care team before I take this medication? They need to know if you have any of these conditions: Fluid in the stomach area or lungs If you often drink alcohol Infection or immune system problems Kidney disease Liver disease Low blood counts (white  cells, platelets, or red blood cells) Lung disease Recent or ongoing radiation Recent or upcoming vaccine Stomach ulcers Ulcerative colitis An unusual or allergic reaction to methotrexate, other medications, foods, dyes, or preservatives Pregnant or trying to get pregnant Breast-feeding How should I use this medication? This medication is for infusion into a vein or for injection into muscle or into the spinal fluid (whichever applies). It is usually given in a hospital or clinic setting. In rare cases, you might get this medication at home. You will be taught how to give this medication. Use exactly as directed. Take your medication at  regular intervals. Do not take your medication more often than directed. If this medication is used for arthritis or psoriasis, it should be taken weekly, NOT daily. It is important that you put your used needles and syringes in a special sharps container. Do not put them in a trash can. If you do not have a sharps container, call your pharmacist or care team to get one. Talk to your care team about the use of this medication in children. While this medication may be prescribed for children as young as 2 years for selected conditions, precautions do apply. Overdosage: If you think you have taken too much of this medicine contact a poison control center or emergency room at once. NOTE: This medicine is only for you. Do not share this medicine with others. What if I miss a dose? It is important not to miss your dose. Call your care team if you are unable to keep an appointment. If you give yourself the medication, and you miss a dose, talk with your care team. Do not take double or extra doses. What may interact with this medication? Do not take this medication with any of the following: Acitretin This medication may also interact with the following: Aspirin or aspirin-like medications including salicylates Azathioprine Certain antibiotics like chloramphenicol, penicillin, tetracycline Certain medications that treat or prevent blood clots like warfarin, apixaban, dabigatran, and rivaroxaban Certain medications for stomach problems like esomeprazole, omeprazole, pantoprazole Cyclosporine Dapsone Diuretics Folic acid Gold Hydroxychloroquine Live virus vaccines Medications for infection like acyclovir, adefovir, amphotericin B, bacitracin, cidofovir, foscarnet, ganciclovir, gentamicin, pentamidine, vancomycin Mercaptopurine NSAIDs, medications for pain and inflammation, like ibuprofen or  naproxen Pamidronate Pemetrexed Penicillamine Phenylbutazone Phenytoin Probenecid Pyrimethamine Retinoids such as isotretinoin and tretinoin Steroid medications like prednisone or cortisone Sulfonamides like sulfasalazine and trimethoprim/sulfamethoxazole Theophylline Zoledronic acid This list may not describe all possible interactions. Give your health care provider a list of all the medicines, herbs, non-prescription drugs, or dietary supplements you use. Also tell them if you smoke, drink alcohol, or use illegal drugs. Some items may interact with your medicine. What should I watch for while using this medication? This medication may make you feel generally unwell. This is not uncommon as chemotherapy can affect healthy cells as well as cancer cells. Report any side effects. Continue your course of treatment even though you feel ill unless your care team tells you to stop. Your condition will be monitored carefully while you are receiving this medication. Avoid alcoholic drinks. This medication can cause serious side effects. To reduce the risk, your care team may give you other medications to take before receiving this one. Be sure to follow the directions from your care team. This medication can make you more sensitive to the sun. Keep out of the sun. If you cannot avoid being in the sun, wear protective clothing and use sunscreen. Do not use sun lamps or tanning  beds/booths. You may get drowsy or dizzy. Do not drive, use machinery, or do anything that needs mental alertness until you know how this medication affects you. Do not stand or sit up quickly, especially if you are an older patient. This reduces the risk of dizzy or fainting spells. You may need blood work while you are taking this medication. Call your care team for advice if you get a fever, chills or sore throat, or other symptoms of a cold or flu. Do not treat yourself. This medication decreases your body's ability to fight  infections. Try to avoid being around people who are sick. This medication may increase your risk to bruise or bleed. Call your care team if you notice any unusual bleeding. Be careful brushing or flossing your teeth or using a toothpick because you may get an infection or bleed more easily. If you have any dental work done, tell your dentist you are receiving this medication Check with your care team if you get an attack of severe diarrhea, nausea and vomiting, or if you sweat a lot. The loss of too much body fluid can make it dangerous for you to take this medication. Talk to your care team about your risk of cancer. You may be more at risk for certain types of cancers if you take this medication. Do not become pregnant while taking this medication or for 6 months after stopping it. Women should inform their care team if they wish to become pregnant or think they might be pregnant. Men should not father a child while taking this medication and for 3 months after stopping it. There is potential for serious harm to an unborn child. Talk to your care team for more information. Do not breast-feed an infant while taking this medication or for 1 week after stopping it. This medication may make it more difficult to get pregnant or father a child. Talk to your care team if you are concerned about your fertility. What side effects may I notice from receiving this medication? Side effects that you should report to your care team as soon as possible: Allergic reactions--skin rash, itching, hives, swelling of the face, lips, tongue, or throat Blood clot--pain, swelling, or warmth in the leg, shortness of breath, chest pain Dry cough, shortness of breath or trouble breathing Infection--fever, chills, cough, sore throat, wounds that don't heal, pain or trouble when passing urine, general feeling of discomfort or being unwell Kidney injury--decrease in the amount of urine, swelling of the ankles, hands, or  feet Liver injury--right upper belly pain, loss of appetite, nausea, light-colored stool, dark yellow or brown urine, yellowing of the skin or eyes, unusual weakness or fatigue Low red blood cell count--unusual weakness or fatigue, dizziness, headache, trouble breathing Redness, blistering, peeling, or loosening of the skin, including inside the mouth Seizures Unusual bruising or bleeding Side effects that usually do not require medical attention (report to your care team if they continue or are bothersome): Diarrhea Dizziness Hair loss Nausea Pain, redness, or swelling with sores inside the mouth or throat Vomiting This list may not describe all possible side effects. Call your doctor for medical advice about side effects. You may report side effects to FDA at 1-800-FDA-1088. Where should I keep my medication? This medication is given in a hospital or clinic. It will not be stored at home. NOTE: This sheet is a summary. It may not cover all possible information. If you have questions about this medicine, talk to your doctor, pharmacist, or  health care provider.  2022 Elsevier/Gold Standard (2021-01-06 00:00:00)  Fluorouracil, 5-FU injection What is this medication? FLUOROURACIL, 5-FU (flure oh YOOR a sil) is a chemotherapy drug. It slows the growth of cancer cells. This medicine is used to treat many types of cancer like breast cancer, colon or rectal cancer, pancreatic cancer, and stomach cancer. This medicine may be used for other purposes; ask your health care provider or pharmacist if you have questions. COMMON BRAND NAME(S): Adrucil What should I tell my care team before I take this medication? They need to know if you have any of these conditions: blood disorders dihydropyrimidine dehydrogenase (DPD) deficiency infection (especially a virus infection such as chickenpox, cold sores, or herpes) kidney disease liver disease malnourished, poor nutrition recent or ongoing radiation  therapy an unusual or allergic reaction to fluorouracil, other chemotherapy, other medicines, foods, dyes, or preservatives pregnant or trying to get pregnant breast-feeding How should I use this medication? This drug is given as an infusion or injection into a vein. It is administered in a hospital or clinic by a specially trained health care professional. Talk to your pediatrician regarding the use of this medicine in children. Special care may be needed. Overdosage: If you think you have taken too much of this medicine contact a poison control center or emergency room at once. NOTE: This medicine is only for you. Do not share this medicine with others. What if I miss a dose? It is important not to miss your dose. Call your doctor or health care professional if you are unable to keep an appointment. What may interact with this medication? Do not take this medicine with any of the following medications: live virus vaccines This medicine may also interact with the following medications: medicines that treat or prevent blood clots like warfarin, enoxaparin, and dalteparin This list may not describe all possible interactions. Give your health care provider a list of all the medicines, herbs, non-prescription drugs, or dietary supplements you use. Also tell them if you smoke, drink alcohol, or use illegal drugs. Some items may interact with your medicine. What should I watch for while using this medication? Visit your doctor for checks on your progress. This drug may make you feel generally unwell. This is not uncommon, as chemotherapy can affect healthy cells as well as cancer cells. Report any side effects. Continue your course of treatment even though you feel ill unless your doctor tells you to stop. In some cases, you may be given additional medicines to help with side effects. Follow all directions for their use. Call your doctor or health care professional for advice if you get a fever,  chills or sore throat, or other symptoms of a cold or flu. Do not treat yourself. This drug decreases your body's ability to fight infections. Try to avoid being around people who are sick. This medicine may increase your risk to bruise or bleed. Call your doctor or health care professional if you notice any unusual bleeding. Be careful brushing and flossing your teeth or using a toothpick because you may get an infection or bleed more easily. If you have any dental work done, tell your dentist you are receiving this medicine. Avoid taking products that contain aspirin, acetaminophen, ibuprofen, naproxen, or ketoprofen unless instructed by your doctor. These medicines may hide a fever. Do not become pregnant while taking this medicine. Women should inform their doctor if they wish to become pregnant or think they might be pregnant. There is a potential for serious  side effects to an unborn child. Talk to your health care professional or pharmacist for more information. Do not breast-feed an infant while taking this medicine. Men should inform their doctor if they wish to father a child. This medicine may lower sperm counts. Do not treat diarrhea with over the counter products. Contact your doctor if you have diarrhea that lasts more than 2 days or if it is severe and watery. This medicine can make you more sensitive to the sun. Keep out of the sun. If you cannot avoid being in the sun, wear protective clothing and use sunscreen. Do not use sun lamps or tanning beds/booths. What side effects may I notice from receiving this medication? Side effects that you should report to your doctor or health care professional as soon as possible: allergic reactions like skin rash, itching or hives, swelling of the face, lips, or tongue low blood counts - this medicine may decrease the number of white blood cells, red blood cells and platelets. You may be at increased risk for infections and bleeding. signs of  infection - fever or chills, cough, sore throat, pain or difficulty passing urine signs of decreased platelets or bleeding - bruising, pinpoint red spots on the skin, black, tarry stools, blood in the urine signs of decreased red blood cells - unusually weak or tired, fainting spells, lightheadedness breathing problems changes in vision chest pain mouth sores nausea and vomiting pain, swelling, redness at site where injected pain, tingling, numbness in the hands or feet redness, swelling, or sores on hands or feet stomach pain unusual bleeding Side effects that usually do not require medical attention (report to your doctor or health care professional if they continue or are bothersome): changes in finger or toe nails diarrhea dry or itchy skin hair loss headache loss of appetite sensitivity of eyes to the light stomach upset unusually teary eyes This list may not describe all possible side effects. Call your doctor for medical advice about side effects. You may report side effects to FDA at 1-800-FDA-1088. Where should I keep my medication? This drug is given in a hospital or clinic and will not be stored at home. NOTE: This sheet is a summary. It may not cover all possible information. If you have questions about this medicine, talk to your doctor, pharmacist, or health care provider.  2022 Elsevier/Gold Standard (2021-07-22 00:00:00)

## 2021-10-17 NOTE — Assessment & Plan Note (Signed)
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, 1.3 cm, with DCIS, margins negative, 0/4 lymph nodes negative, T1 cN0 stage IA pathologic stage, ER 100%, PR 100%, HER-2 negative ratio 1.39, Ki-67 20% (Originally 2 nodules were detected on screening mammogram 1.9 cm= fat necrosis and 8 mm IDC) Oncotype DX score 20, 13% risk of recurrence, intermediate risk Adjuvant radiation therapy started 07/20/2017completed 07/13/2016   09/02/2021:Right lumpectomy: Grade 2 ILC 3.1 cm, margins negative, LVI present, 4/6 lymph nodes macrometastases, 2 lymph nodes isolated tumor cells, ER 100%, PR 90%, Ki-67 25%, HER2 negative  Treatment plan: 1.  Based on for enlarged lymph nodes, recommended systemic chemotherapy with CMF x8 cycles 2. adjuvant radiation therapy 3.  For the continued antiestrogen therapy (previously anastrozole started 07/23/2016 switched to letrozole 2018 switched to tamoxifen 2018, switched back to anastrozole) hair loss was the main problem, may consider exemestane ------------------------------------------------------------------------------------------------------------------------------- 10/15/2021: CT CAP: No metastatic disease.  Descending and sigmoid colon diverticulosis, several tiny pulmonary nodules less than 5 mm, 2.1 cm left thyroid nodule  Current treatment: Cycle 1 CMF Return to clinic in 1 week for toxicity check

## 2021-10-18 ENCOUNTER — Encounter (HOSPITAL_BASED_OUTPATIENT_CLINIC_OR_DEPARTMENT_OTHER): Payer: Self-pay | Admitting: Surgery

## 2021-10-20 ENCOUNTER — Telehealth: Payer: Self-pay | Admitting: Hematology and Oncology

## 2021-10-20 NOTE — Telephone Encounter (Signed)
Rescheduled appts per patient request. Needed after 12. Called and spoke with patient. Confirmed appts

## 2021-10-22 ENCOUNTER — Encounter: Payer: Self-pay | Admitting: Nurse Practitioner

## 2021-10-22 ENCOUNTER — Other Ambulatory Visit: Payer: Self-pay

## 2021-10-22 ENCOUNTER — Ambulatory Visit (INDEPENDENT_AMBULATORY_CARE_PROVIDER_SITE_OTHER): Payer: Medicare HMO | Admitting: Nurse Practitioner

## 2021-10-22 VITALS — BP 138/88 | HR 70 | Temp 97.3°F | Ht 62.0 in | Wt 203.4 lb

## 2021-10-22 DIAGNOSIS — Z170 Estrogen receptor positive status [ER+]: Secondary | ICD-10-CM

## 2021-10-22 DIAGNOSIS — C50412 Malignant neoplasm of upper-outer quadrant of left female breast: Secondary | ICD-10-CM

## 2021-10-22 DIAGNOSIS — F5101 Primary insomnia: Secondary | ICD-10-CM | POA: Diagnosis not present

## 2021-10-22 DIAGNOSIS — G35 Multiple sclerosis: Secondary | ICD-10-CM | POA: Diagnosis not present

## 2021-10-22 DIAGNOSIS — I10 Essential (primary) hypertension: Secondary | ICD-10-CM | POA: Diagnosis not present

## 2021-10-22 DIAGNOSIS — E7800 Pure hypercholesterolemia, unspecified: Secondary | ICD-10-CM | POA: Diagnosis not present

## 2021-10-22 MED ORDER — ESZOPICLONE 1 MG PO TABS: ORAL_TABLET | ORAL | 0 refills | Status: DC

## 2021-10-22 NOTE — Progress Notes (Signed)
Careteam: Patient Care Team: Michele Chandler, NP as PCP - General (Geriatric Medicine) Michele Harp, MD as PCP - Cardiology (Cardiology) Michele Lose, MD as Consulting Physician (Oncology) Michele Luna, MD as Consulting Physician (General Surgery) Thea Silversmith, MD as Consulting Physician (Radiation Oncology) Mauro Kaufmann, RN as Oncology Nurse Navigator Rockwell Germany, RN as Oncology Nurse Navigator  PLACE OF SERVICE:  [facility] Directive information    No Known Allergies  Chief Complaint  Patient presents with   Medical Management of Chronic Issues    Medical Management of Chronic Issues. 6 Month follow up. Refuses Flu injection.      HPI: Patient is a 65 y.o. female for routine follow up  Breast cancer being followed up oncology, s/p lumpectomy now getting chemotherapy for 6 months per patient. Reports she was sick after her first treatment.  Reports she is having constipation wants to discuss this more with oncologist.  She has decrease appetite.   She has not been taking her lunesta- did not know to call and get refill. She has not been sleeping well.   Denies anxiety or depression.   Review of Systems:  Review of Systems  Constitutional:  Negative for chills, fever and weight loss.  HENT:  Negative for tinnitus.   Respiratory:  Negative for cough, sputum production and shortness of breath.   Cardiovascular:  Negative for chest pain, palpitations and leg swelling.  Gastrointestinal:  Positive for constipation. Negative for abdominal pain, diarrhea and heartburn.  Genitourinary:  Negative for dysuria, frequency and urgency.  Musculoskeletal:  Negative for back pain, falls, joint pain and myalgias.  Skin: Negative.   Neurological:  Negative for dizziness and headaches.  Psychiatric/Behavioral:  Negative for depression and memory loss. The patient has insomnia.    Past Medical History:  Diagnosis Date   Anemia    past hx many yrs  ago    Arthritis    lt knee   Breast cancer ([place])    Breast cancer of upper-outer quadrant of left female breast ([place]) 04/02/2016   Chronic kidney disease    CKD   Colon polyps    COPD (chronic obstructive pulmonary disease) ([name])    GERD (gastroesophageal reflux disease)    years ago- not current    Hyperlipidemia    Hypertension    Multiple sclerosis ([place])    Personal history of chemotherapy    Personal history of radiation therapy    Stroke ([place])    2013, mild cognitive deficits   Vision abnormalities    Past Surgical History:  Procedure Laterality Date   BREAST BIOPSY Left 03/31/2016   BREAST BIOPSY Right 07/24/2021   BREAST LUMPECTOMY Left 04/17/2016   BREAST LUMPECTOMY WITH RADIOACTIVE SEED AND SENTINEL LYMPH NODE BIOPSY Right 09/02/2021   Procedure: RIGHT BREAST LUMPECTOMY WITH RADIOACTIVE SEED AND SENTINEL LYMPH NODE BIOPSY;  Surgeon: Michele Luna, MD;  Location: [facility];  Service: General;  Laterality: Right;   COLONOSCOPY     HYSTEROSCOPY WITH D & C N/A 12/19/2015   Procedure: DILATATION AND CURETTAGE /HYSTEROSCOPY;  Surgeon: Linda Hedges, DO;  Location: [facility];  Service: Gynecology;  Laterality: N/A;   POLYPECTOMY     PORTACATH PLACEMENT Right 10/16/2021   Procedure: INSERTION PORT-A-CATH;  Surgeon: Michele Luna, MD;  Location: [facility];  Service: General;  Laterality: Right;   RADIOACTIVE SEED GUIDED PARTIAL MASTECTOMY WITH AXILLARY SENTINEL LYMPH NODE BIOPSY Left 04/17/2016   Procedure: RADIOACTIVE SEED GUIDED  PARTIAL MASTECTOMY WITH AXILLARY SENTINEL LYMPH NODE BIOPSY;  Surgeon: Michele Luna, MD;  Location: [facility];  Service: General;  Laterality: Left;  RADIOACTIVE SEED GUIDED PARTIAL MASTECTOMY WITH AXILLARY SENTINEL LYMPH NODE BIOPSY    TUBAL LIGATION     VAGINAL DELIVERY     x3   Social History:   reports that she quit smoking about 16 years ago. Her smoking use included cigarettes. She has a 12.50  pack-year smoking history. She has never used smokeless tobacco. She reports current alcohol use. She reports that she does not use drugs.  Family History  Problem Relation Age of Onset   Stroke Sister    Hypertension Mother    Stroke Mother    Alcohol abuse Brother    HIV/AIDS Brother    Colon cancer Neg Hx    Colon polyps Neg Hx    Esophageal cancer Neg Hx    Rectal cancer Neg Hx    Stomach cancer Neg Hx     Medications: Patient's Medications  New Prescriptions   No medications on file  Previous Medications   ANASTROZOLE (ARIMIDEX) 1 MG TABLET    Take 1 tablet (1 mg total) by mouth daily.   ASPIRIN EC 81 MG TABLET    Take 1 tablet (81 mg total) by mouth daily.   ATORVASTATIN (LIPITOR) 20 MG TABLET    TAKE 1 TABLET EVERY DAY   ESZOPICLONE (LUNESTA) 2 MG TABS TABLET    Take one tablet by mouth at bedtime as needed for sleep (take immediately before bedtime)   LABETALOL (NORMODYNE) 300 MG TABLET    TAKE 2 TABLETS THREE TIMES DAILY   NIFEDIPINE (PROCARDIA XL/NIFEDICAL XL) 60 MG 24 HR TABLET    Take 1 tablet (60 mg total) by mouth 2 (two) times daily.   ONDANSETRON (ZOFRAN) 8 MG TABLET    Take 1 tablet (8 mg total) by mouth 2 (two) times daily as needed for refractory nausea / vomiting. Start on day 3 after chemotherapy.  Modified Medications   No medications on file  Discontinued Medications   IBUPROFEN (ADVIL) 800 MG TABLET    Take 1 tablet (800 mg total) by mouth every 8 (eight) hours as needed.   LIDOCAINE-PRILOCAINE (EMLA) CREAM    Apply to affected area once   OXYCODONE (OXY IR/ROXICODONE) 5 MG IMMEDIATE RELEASE TABLET    Take 1 tablet (5 mg total) by mouth every 6 (six) hours as needed for severe pain.   PROCHLORPERAZINE (COMPAZINE) 10 MG TABLET    Take 1 tablet (10 mg total) by mouth every 6 (six) hours as needed (Nausea or vomiting).    Physical Exam:  Vitals:   10/22/21 1236  BP: 138/88  Pulse: 70  Temp: (!) 97.3 F (36.3 C)  TempSrc: Skin  SpO2: 98%  Weight: 203  lb 6.4 oz (92.3 kg)  Height: 5\' 2"  (1.575 m)   Body mass index is 37.2 kg/m. Wt Readings from Last 3 Encounters:  10/22/21 203 lb 6.4 oz (92.3 kg)  10/17/21 209 lb 12.8 oz (95.2 kg)  10/16/21 204 lb 9.4 oz (92.8 kg)    Physical Exam Constitutional:      General: She is not in acute distress.    Appearance: She is well-developed. She is not diaphoretic.  HENT:     Head: Normocephalic and atraumatic.     Mouth/Throat:     Pharynx: No oropharyngeal exudate.  Eyes:     Conjunctiva/sclera: Conjunctivae normal.     Pupils: Pupils  are equal, round, and reactive to light.  Cardiovascular:     Rate and Rhythm: Normal rate and regular rhythm.     Heart sounds: Normal heart sounds.  Pulmonary:     Effort: Pulmonary effort is normal.     Breath sounds: Normal breath sounds.  Abdominal:     General: Bowel sounds are normal.     Palpations: Abdomen is soft.  Musculoskeletal:     Cervical back: Normal range of motion and neck supple.     Right lower leg: No edema.     Left lower leg: No edema.  Skin:    General: Skin is warm and dry.  Neurological:     Mental Status: She is alert.  Psychiatric:        Mood and Affect: Mood normal.    Labs reviewed: Basic Metabolic Panel: Recent Labs    04/18/21 1348 08/12/21 1250 10/14/21 1319 10/17/21 1237  NA 140 144  --  141  K 4.0 3.6  --  3.9  CL 105 109  --  106  CO2 22 22  --  24  GLUCOSE 96 126*  --  95  BUN 17 27*  --  25*  CREATININE 1.61* 2.07* 1.40* 1.33*  CALCIUM 9.6 9.8  --  9.5   Liver Function Tests: Recent Labs    04/18/21 1348 08/12/21 1250 10/17/21 1237  AST 28 23 23   ALT 25 17 23   ALKPHOS  --  109 118  BILITOT 0.5 0.5 0.4  PROT 6.7 7.2 7.3  ALBUMIN  --  4.3 4.1   No results for input(s): LIPASE, AMYLASE in the last 8760 hours. No results for input(s): AMMONIA in the last 8760 hours. CBC: Recent Labs    04/18/21 1348 08/12/21 1250 10/17/21 1237  WBC 3.9 5.3 10.1  NEUTROABS 2,180 3.2 7.3  HGB 12.3  11.5* 11.2*  HCT 36.4 33.1* 32.7*  MCV 91.9 90.2 91.9  PLT 262 286 272   Lipid Panel: Recent Labs    04/18/21 1348  CHOL 139  HDL 66  LDLCALC 58  TRIG 73  CHOLHDL 2.1   TSH: No results for input(s): TSH in the last 8760 hours. A1C: Lab Results  Component Value Date   HGBA1C 5.8 (H) 05/13/2020     Assessment/Plan 1. Essential hypertension -Blood pressure well controlled Continue current medications Recheck metabolic panel  2. Malignant neoplasm of upper-outer quadrant of left breast in female, estrogen receptor positive ([place]) -s/p lumpectomy, followed by oncology and now receiving chemotherapy.   3. Morbid obesity ([place]) -she has had decreased appetite but encouraged her to continue to eat proper calories to help with protein and well balanced diet for from nutrition.  4. Multiple sclerosis ([place]) -followed by neurology, reports this has been stable without worsening symptoms.   5. Pure hypercholesterolemia -continues on lipitor with dietary modifications.   6. Primary insomnia -requesting refill. Continue sleep routine  - eszopiclone (LUNESTA) 1 MG TABS tablet; Take one tablet by mouth at bedtime as needed for sleep (take immediately before bedtime)  Dispense: 30 tablet; Refill: 0   Next appt: 6 months.  Carlos American. [place], [facility] [phone]

## 2021-10-22 NOTE — Patient Instructions (Signed)
Miralax 17 gm daily with at least 8 oz of fluid daily to help move bowels.

## 2021-10-23 ENCOUNTER — Other Ambulatory Visit: Payer: Self-pay | Admitting: Hematology and Oncology

## 2021-10-23 DIAGNOSIS — Z853 Personal history of malignant neoplasm of breast: Secondary | ICD-10-CM

## 2021-10-24 ENCOUNTER — Inpatient Hospital Stay: Payer: Medicare HMO

## 2021-10-24 ENCOUNTER — Encounter: Payer: Self-pay | Admitting: Adult Health

## 2021-10-24 ENCOUNTER — Encounter: Payer: Self-pay | Admitting: *Deleted

## 2021-10-24 ENCOUNTER — Inpatient Hospital Stay: Payer: Medicare HMO | Admitting: Adult Health

## 2021-10-24 ENCOUNTER — Other Ambulatory Visit: Payer: Self-pay

## 2021-10-24 ENCOUNTER — Ambulatory Visit: Payer: Medicare HMO | Admitting: Nurse Practitioner

## 2021-10-24 VITALS — BP 151/71 | HR 72 | Temp 97.5°F | Resp 19 | Ht 62.0 in | Wt 204.0 lb

## 2021-10-24 DIAGNOSIS — Z95828 Presence of other vascular implants and grafts: Secondary | ICD-10-CM

## 2021-10-24 DIAGNOSIS — Z170 Estrogen receptor positive status [ER+]: Secondary | ICD-10-CM

## 2021-10-24 DIAGNOSIS — C50411 Malignant neoplasm of upper-outer quadrant of right female breast: Secondary | ICD-10-CM

## 2021-10-24 DIAGNOSIS — C50412 Malignant neoplasm of upper-outer quadrant of left female breast: Secondary | ICD-10-CM | POA: Diagnosis not present

## 2021-10-24 DIAGNOSIS — Z5111 Encounter for antineoplastic chemotherapy: Secondary | ICD-10-CM | POA: Diagnosis not present

## 2021-10-24 LAB — CMP (CANCER CENTER ONLY)
ALT: 14 U/L (ref 0–44)
AST: 18 U/L (ref 15–41)
Albumin: 4 g/dL (ref 3.5–5.0)
Alkaline Phosphatase: 96 U/L (ref 38–126)
Anion gap: 8 (ref 5–15)
BUN: 19 mg/dL (ref 8–23)
CO2: 24 mmol/L (ref 22–32)
Calcium: 9.1 mg/dL (ref 8.9–10.3)
Chloride: 109 mmol/L (ref 98–111)
Creatinine: 1.27 mg/dL — ABNORMAL HIGH (ref 0.44–1.00)
GFR, Estimated: 47 mL/min — ABNORMAL LOW (ref >60)
Glucose, Bld: 96 mg/dL (ref 70–99)
Potassium: 4.2 mmol/L (ref 3.5–5.1)
Sodium: 141 mmol/L (ref 135–145)
Total Bilirubin: 0.5 mg/dL (ref 0.3–1.2)
Total Protein: 6.7 g/dL (ref 6.5–8.1)

## 2021-10-24 LAB — CBC WITH DIFFERENTIAL (CANCER CENTER ONLY)
Abs Immature Granulocytes: 0.01 10*3/uL (ref 0.00–0.07)
Basophils Absolute: 0 10*3/uL (ref 0.0–0.1)
Basophils Relative: 1 %
Eosinophils Absolute: 0.1 10*3/uL (ref 0.0–0.5)
Eosinophils Relative: 3 %
HCT: 32 % — ABNORMAL LOW (ref 36.0–46.0)
Hemoglobin: 10.7 g/dL — ABNORMAL LOW (ref 12.0–15.0)
Immature Granulocytes: 0 %
Lymphocytes Relative: 28 %
Lymphs Abs: 1.2 10*3/uL (ref 0.7–4.0)
MCH: 31 pg (ref 26.0–34.0)
MCHC: 33.4 g/dL (ref 30.0–36.0)
MCV: 92.8 fL (ref 80.0–100.0)
Monocytes Absolute: 0.2 10*3/uL (ref 0.1–1.0)
Monocytes Relative: 5 %
Neutro Abs: 2.6 10*3/uL (ref 1.7–7.7)
Neutrophils Relative %: 63 %
Platelet Count: 264 10*3/uL (ref 150–400)
RBC: 3.45 MIL/uL — ABNORMAL LOW (ref 3.87–5.11)
RDW: 12.7 % (ref 11.5–15.5)
WBC Count: 4.2 10*3/uL (ref 4.0–10.5)
nRBC: 0 % (ref 0.0–0.2)

## 2021-10-24 MED ORDER — SODIUM CHLORIDE 0.9% FLUSH
10.0000 mL | INTRAVENOUS | Status: DC | PRN
  Administered 2021-10-24: 10 mL via INTRAVENOUS

## 2021-10-24 MED ORDER — HEPARIN SOD (PORK) LOCK FLUSH 100 UNIT/ML IV SOLN
500.0000 [IU] | Freq: Once | INTRAVENOUS | Status: AC
  Administered 2021-10-24: 500 [IU] via INTRAVENOUS

## 2021-10-24 NOTE — Assessment & Plan Note (Addendum)
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, 1.3 cm, with DCIS, margins negative, 0/4 lymph nodes negative, T1 cN0 stage IA pathologic stage, ER 100%, PR 100%, HER-2 negative ratio 1.39, Ki-67 20% (Originally 2 nodules were detected on screening mammogram 1.9 cm= fat necrosis and 8 mm IDC) Oncotype DX score 20, 13% risk of recurrence, intermediate risk Adjuvant radiation therapy started 07/20/2017completed 07/13/2016   09/02/2021:Right lumpectomy: Grade 2 ILC 3.1 cm, margins negative, LVI present, 4/6 lymph nodes macrometastases, 2 lymph nodes isolated tumor cells, ER 100%, PR 90%, Ki-67 25%, HER2 negative  Treatment plan: 1.  Based on for enlarged lymph nodes, recommended systemic chemotherapy with CMF x8 cycles 2. adjuvant radiation therapy 3.  For the continued antiestrogen therapy (previously anastrozole started 07/23/2016 switched to letrozole 2018 switched to tamoxifen 2018, switched back to anastrozole) hair loss was the main problem, may consider exemestane ------------------------------------------------------------------------------------------------------------------------------- 10/15/2021: CT CAP: No metastatic disease.  Descending and sigmoid colon diverticulosis, several tiny pulmonary nodules less than 5 mm, 2.1 cm left thyroid nodule  Current treatment: Cycle 1 day 8 CMF  [name] is doing quite well following her chemotherapy.  Her labs remained stable on reviewing her CBC.  She has not had a decrease in her white blood cell count.  We reviewed her overall treatment regimen.  We discussed adjuvant radiation, and I sent a message to Dr. Lindi Adie to inquire as to if this would occur following her chemotherapy or during her chemotherapy.  Sheehan reviewed risk factors relating to breast cancer.  I gave her reassurance and encouraged her to maintain her positive attitude about her breast cancer and treatment.  [name] will return in 2 weeks for labs, follow-up, and her second cycle of  CMF.

## 2021-10-24 NOTE — Progress Notes (Signed)
[facility] Follow up:    [place], [place] [address]   DIAGNOSIS:  Cancer Staging  Breast cancer of upper-outer quadrant of left female breast [facility]) Staging form: Breast, AJCC 7th Edition - Clinical stage from 04/08/2016: Stage IA (T1b, N0, M0) - Unsigned Staged by: Pathologist and managing physician Laterality: Left Estrogen receptor status: Positive Progesterone receptor status: Positive HER2 status: Negative Stage used in treatment planning: Yes National guidelines used in treatment planning: Yes Type of national guideline used in treatment planning: NCCN Staging comments: Staged at breast conference on 5.24.17 - Pathologic stage from 04/17/2016: Stage IA (T1c, N0, cM0) - Signed by Holley Bouche, NP on 10/09/2016 Laterality: Left Estrogen receptor status: Positive Progesterone receptor status: Positive HER2 status: Negative  Malignant neoplasm of upper-outer quadrant of right breast in female, estrogen receptor positive ([place]) Staging form: Breast, AJCC 8th Edition - Clinical: Stage IB (cT2, cN0, cM0, G2, ER+, PR+, HER2-) - Signed by Nicholas Lose, MD on 08/04/2021 Stage prefix: Initial diagnosis Histologic grading system: 3 grade system   SUMMARY OF ONCOLOGIC HISTORY: Oncology History  Breast cancer of upper-outer quadrant of left female breast ([place])  03/31/2016 Initial Diagnosis   Screening detected left breast mass, 2 nodules, 1.9 x 1.6 x 0.8 cm= fat necrosis; 8 x 7 x 7 mm= grade 2 IDC ER 100%, PR 100%, HER-2 negative ratio 1.39, Ki-67 20%   04/17/2016 Surgery   Left lumpectomy ([name]): IDC grade 2, 1.3 cm, with DCIS, margins negative, 0/4 lymph nodes negative, T1 cN0 stage IA pathologic stage, ER 100%, PR 100%, HER-2 negative ratio 1.39, Ki-67 20% Oncotype DX score 20, 13% ROR, intermediate risk   04/17/2016 Oncotype testing   Recurrence score: 20; ROR 15% (intermediate risk)    05/27/2016 - 07/13/2016 Radiation  Therapy   Adjuvant radiation therapy [facility]): Left breast treated with breath hold to 50.4 Gy in 28 fractions at 1.8 Gy/fraction.  Left breast boosted to 10 Gy in 5 fractions at 2 Gy/fraction   07/23/2016 -  Anti-estrogen oral therapy   Anastrozole 1 mg switched to letrozole 04/22/2017 due to hair loss, switch to tamoxifen 10/22/2017 due to hair loss from letrozole as well   Malignant neoplasm of upper-outer quadrant of right breast in female, estrogen receptor positive ([place])  08/04/2021 Initial Diagnosis   Malignant neoplasm of upper-outer quadrant of right breast in female, estrogen receptor positive ([name])    Relapse/Recurrence   2.2 cm ILC ER 100%, PR 90%, Her 2 Neg, Ki 67: 25%, Size 2.5 cm   08/04/2021 Cancer Staging   Staging form: Breast, AJCC 8th Edition - Clinical: Stage IB (cT2, cN0, cM0, G2, ER+, PR+, HER2-) - Signed by Nicholas Lose, MD on 08/04/2021 Stage prefix: Initial diagnosis Histologic grading system: 3 grade system    09/02/2021 Surgery   Right lumpectomy: Grade 2 ILC 3.1 cm, margins negative, LVI present, 4/6 lymph nodes macrometastases, 2 lymph nodes isolated tumor cells, ER 100%, PR 90%, Ki-67 25%, HER2 negative   10/17/2021 -  Chemotherapy   Patient is on Treatment Plan : BREAST Adjuvant CMF IV q21d       CURRENT THERAPY: CMF  INTERVAL HISTORY: Michele Page 65 y.o. female returns for evaluation after receiving her first cycle of adjuvant CMF.  She notes that she tolerated it quite well.  She said that she felt a little unusual for the 2 days following her chemotherapy however she has been well since then.  She  struggles a lot with the "why" behind her cancer recurrence.  However she notes that God is good and she plans on maintaining a positive attitude and not dwelling on her recurrence and treatment.   Patient Active Problem List   Diagnosis Date Noted   Malignant neoplasm of upper-outer quadrant of right breast in female, estrogen receptor positive ([place])  08/04/2021   Class 2 severe obesity due to excess calories with serious comorbidity and body mass index (BMI) of 37.0 to 37.9 in adult [facility]) 11/18/2020   Neck pain on right side 06/01/2018   History of breast cancer 11/25/2017   Breast cancer of upper-outer quadrant of left female breast ([place]) 04/02/2016   Essential hypertension 01/10/2016   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 01/10/2016   Gastroesophageal reflux disease without esophagitis 01/10/2016   Hyperlipidemia 01/10/2016   CKD (chronic kidney disease) 01/10/2016   Multiple sclerosis ([place]) 03/20/2015   Accelerated hypertension 03/20/2015   Cerebral infarction ([place]) 03/20/2015   Hemiplegia following CVA (cerebrovascular accident) ([place]) 03/20/2015    has No Known Allergies.  MEDICAL HISTORY: Past Medical History:  Diagnosis Date   Anemia    past hx many yrs ago    Arthritis    lt knee   Breast cancer ([place])    Breast cancer of upper-outer quadrant of left female breast ([place]) 04/02/2016   Chronic kidney disease    CKD   Colon polyps    COPD (chronic obstructive pulmonary disease) ([name])    GERD (gastroesophageal reflux disease)    years ago- not current    Hyperlipidemia    Hypertension    Multiple sclerosis ([place])    Personal history of chemotherapy    Personal history of radiation therapy    Stroke ([place])    2013, mild cognitive deficits   Vision abnormalities     SURGICAL HISTORY: Past Surgical History:  Procedure Laterality Date   BREAST BIOPSY Left 03/31/2016   BREAST BIOPSY Right 07/24/2021   BREAST LUMPECTOMY Left 04/17/2016   BREAST LUMPECTOMY WITH RADIOACTIVE SEED AND SENTINEL LYMPH NODE BIOPSY Right 09/02/2021   Procedure: RIGHT BREAST LUMPECTOMY WITH RADIOACTIVE SEED AND SENTINEL LYMPH NODE BIOPSY;  Surgeon: Erroll Luna, MD;  Location: [facility];  Service: General;  Laterality: Right;   COLONOSCOPY     HYSTEROSCOPY WITH D & C N/A 12/19/2015   Procedure:  DILATATION AND CURETTAGE /HYSTEROSCOPY;  Surgeon: Linda Hedges, DO;  Location: [facility];  Service: Gynecology;  Laterality: N/A;   POLYPECTOMY     PORTACATH PLACEMENT Right 10/16/2021   Procedure: INSERTION PORT-A-CATH;  Surgeon: Erroll Luna, MD;  Location: [facility];  Service: General;  Laterality: Right;   RADIOACTIVE SEED GUIDED PARTIAL MASTECTOMY WITH AXILLARY SENTINEL LYMPH NODE BIOPSY Left 04/17/2016   Procedure: RADIOACTIVE SEED GUIDED PARTIAL MASTECTOMY WITH AXILLARY SENTINEL LYMPH NODE BIOPSY;  Surgeon: Erroll Luna, MD;  Location: [facility];  Service: General;  Laterality: Left;  RADIOACTIVE SEED GUIDED PARTIAL MASTECTOMY WITH AXILLARY SENTINEL LYMPH NODE BIOPSY    TUBAL LIGATION     VAGINAL DELIVERY     x3    SOCIAL HISTORY: Social History   Socioeconomic History   Marital status: Married    Spouse name: Not on file   Number of children: 3   Years of education: Not on file   Highest education level: Not on file  Occupational History   Occupation: retired  Tobacco Use   Smoking status: Former    Packs/day:  0.50    Years: 25.00    Pack years: 12.50    Types: Cigarettes    Quit date: 11/16/2004    Years since quitting: 16.9   Smokeless tobacco: Never  Vaping Use   Vaping Use: Never used  Substance and Sexual Activity   Alcohol use: Yes    Alcohol/week: 0.0 standard drinks    Comment: occas   Drug use: No   Sexual activity: Yes    Comment: 1st intercourse 80 yo-5 partners  Other Topics Concern   Not on file  Social History Narrative   Diet- N/A   Caffeine- Yes   Married- Yes   House- 2 story with 2 people   Pets- No   Current/past profession- Insurance underwriter daycare   Exercise- Yes   Living will-No   DNR-N/A   POA/HPOA-No      Works part-time at [employer] Strain: Not on file  Food Insecurity: Not on file  Transportation Needs: Not on file  Physical Activity: Not on  file  Stress: Not on file  Social Connections: Not on file  Intimate Partner Violence: Not on file    FAMILY HISTORY: Family History  Problem Relation Age of Onset   Stroke Sister    Hypertension Mother    Stroke Mother    Alcohol abuse Brother    HIV/AIDS Brother    Colon cancer Neg Hx    Colon polyps Neg Hx    Esophageal cancer Neg Hx    Rectal cancer Neg Hx    Stomach cancer Neg Hx     Review of Systems  Constitutional:  Negative for appetite change, chills, fatigue, fever and unexpected weight change.  HENT:   Negative for hearing loss, lump/mass and trouble swallowing.   Eyes:  Negative for eye problems and icterus.  Respiratory:  Negative for chest tightness, cough and shortness of breath.   Cardiovascular:  Negative for chest pain, leg swelling and palpitations.  Gastrointestinal:  Negative for abdominal distention, abdominal pain, constipation, diarrhea, nausea and vomiting.  Endocrine: Negative for hot flashes.  Genitourinary:  Negative for difficulty urinating.   Musculoskeletal:  Negative for arthralgias.  Skin:  Negative for itching and rash.  Neurological:  Negative for dizziness, extremity weakness, headaches and numbness.  Hematological:  Negative for adenopathy. Does not bruise/bleed easily.  Psychiatric/Behavioral:  Negative for depression. The patient is not nervous/anxious.      PHYSICAL EXAMINATION  ECOG PERFORMANCE STATUS: 1 - Symptomatic but completely ambulatory  Vitals:   10/24/21 1039  BP: (!) 151/71  Pulse: 72  Resp: 19  Temp: (!) 97.5 F (36.4 C)  SpO2: 100%    Physical Exam Constitutional:      General: She is not in acute distress.    Appearance: Normal appearance. She is not toxic-appearing.  HENT:     Head: Normocephalic and atraumatic.  Eyes:     General: No scleral icterus. Cardiovascular:     Rate and Rhythm: Normal rate and regular rhythm.     Pulses: Normal pulses.     Heart sounds: Normal heart sounds.  Pulmonary:      Effort: Pulmonary effort is normal.     Breath sounds: Normal breath sounds.  Abdominal:     General: Abdomen is flat. Bowel sounds are normal. There is no distension.     Palpations: Abdomen is soft.     Tenderness: There is no abdominal tenderness.  Musculoskeletal:  General: No swelling.     Cervical back: Neck supple.  Lymphadenopathy:     Cervical: No cervical adenopathy.  Skin:    General: Skin is warm and dry.     Findings: No rash.  Neurological:     General: No focal deficit present.     Mental Status: She is alert.  Psychiatric:        Mood and Affect: Mood normal.        Behavior: Behavior normal.    LABORATORY DATA:  CBC    Component Value Date/Time   WBC 4.2 10/24/2021 1012   WBC 3.9 04/18/2021 1348   RBC 3.45 (L) 10/24/2021 1012   HGB 10.7 (L) 10/24/2021 1012   HGB 12.6 04/08/2016 0904   HCT 32.0 (L) 10/24/2021 1012   HCT 38.0 04/08/2016 0904   PLT 264 10/24/2021 1012   PLT 271 04/08/2016 0904   MCV 92.8 10/24/2021 1012   MCV 93.6 04/08/2016 0904   MCH 31.0 10/24/2021 1012   MCHC 33.4 10/24/2021 1012   RDW 12.7 10/24/2021 1012   RDW 13.4 04/08/2016 0904   LYMPHSABS 1.2 10/24/2021 1012   LYMPHSABS 1.4 04/08/2016 0904   MONOABS 0.2 10/24/2021 1012   MONOABS 0.3 04/08/2016 0904   EOSABS 0.1 10/24/2021 1012   EOSABS 0.1 04/08/2016 0904   BASOSABS 0.0 10/24/2021 1012   BASOSABS 0.1 04/08/2016 0904    CMP     Component Value Date/Time   NA 141 10/24/2021 1012   NA 146 (H) 07/29/2020 1655   NA 141 04/08/2016 0904   K 4.2 10/24/2021 1012   K 4.7 04/08/2016 0904   CL 109 10/24/2021 1012   CO2 24 10/24/2021 1012   CO2 26 04/08/2016 0904   GLUCOSE 96 10/24/2021 1012   GLUCOSE 124 04/08/2016 0904   BUN 19 10/24/2021 1012   BUN 23 07/29/2020 1655   BUN 16.3 04/08/2016 0904   CREATININE 1.27 (H) 10/24/2021 1012   CREATININE 1.61 (H) 04/18/2021 1348   CREATININE 1.5 (H) 04/08/2016 0904   CALCIUM 9.1 10/24/2021 1012   CALCIUM 10.0  04/08/2016 0904   PROT 6.7 10/24/2021 1012   PROT 7.6 04/08/2016 0904   ALBUMIN 4.0 10/24/2021 1012   ALBUMIN 4.3 04/08/2016 0904   AST 18 10/24/2021 1012   AST 25 04/08/2016 0904   ALT 14 10/24/2021 1012   ALT 23 04/08/2016 0904   ALKPHOS 96 10/24/2021 1012   ALKPHOS 90 04/08/2016 0904   BILITOT 0.5 10/24/2021 1012   BILITOT 0.58 04/08/2016 0904   GFRNONAA 47 (L) 10/24/2021 1012   GFRNONAA 33 (L) 04/18/2021 1348   GFRAA 38 (L) 04/18/2021 1348      ASSESSMENT and THERAPY PLAN:   Breast cancer of upper-outer quadrant of left female breast ([place]) 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, 1.3 cm, with DCIS, margins negative, 0/4 lymph nodes negative, T1 cN0 stage IA pathologic stage, ER 100%, PR 100%, HER-2 negative ratio 1.39, Ki-67 20% (Originally 2 nodules were detected on screening mammogram 1.9 cm= fat necrosis and 8 mm IDC) Oncotype DX score 20, 13% risk of recurrence, intermediate risk Adjuvant radiation therapy started 07/20/2017completed 07/13/2016     09/02/2021:Right lumpectomy: Grade 2 ILC 3.1 cm, margins negative, LVI present, 4/6 lymph nodes macrometastases, 2 lymph nodes isolated tumor cells, ER 100%, PR 90%, Ki-67 25%, HER2 negative   Treatment plan: 1.  Based on for enlarged lymph nodes, recommended systemic chemotherapy with CMF x8 cycles 2. adjuvant radiation therapy 3.  For the continued  antiestrogen therapy (previously anastrozole started 07/23/2016 switched to letrozole 2018 switched to tamoxifen 2018, switched back to anastrozole) hair loss was the main problem, may consider exemestane ------------------------------------------------------------------------------------------------------------------------------- 10/15/2021: CT CAP: No metastatic disease.  Descending and sigmoid colon diverticulosis, several tiny pulmonary nodules less than 5 mm, 2.1 cm left thyroid nodule  Current treatment: Cycle 1 day 8 CMF  [name] is doing quite well following her  chemotherapy.  Her labs remained stable on reviewing her CBC.  She has not had a decrease in her white blood cell count.  We reviewed her overall treatment regimen.  We discussed adjuvant radiation, and I sent a message to Dr. Lindi Adie to inquire as to if this would occur following her chemotherapy or during her chemotherapy.  Sheehan reviewed risk factors relating to breast cancer.  I gave her reassurance and encouraged her to maintain her positive attitude about her breast cancer and treatment.  [name] will return in 2 weeks for labs, follow-up, and her second cycle of CMF.  All questions were answered. The patient knows to call the clinic with any problems, questions or concerns. We can certainly see the patient much sooner if necessary.  Total encounter time: 30 minutes in face-to-face visit time, chart review, lab review, care coordination, and documentation of the encounter.  Wilber Bihari, NP 10/24/21 11:29 AM Medical Oncology and Hematology [facility] [place],  [address] Tel. [phone]    Fax. [phone]  *Total Encounter Time as defined by the Centers for Medicare and Medicaid Services includes, in addition to the face-to-face time of a patient visit (documented in the note above) non-face-to-face time: obtaining and reviewing outside history, ordering and reviewing medications, tests or procedures, care coordination (communications with other health care professionals or caregivers) and documentation in the medical record.

## 2021-10-27 ENCOUNTER — Telehealth: Payer: Self-pay | Admitting: *Deleted

## 2021-10-27 NOTE — Telephone Encounter (Signed)
She did have 2 mg in the past but since she has been off medication for a while would like her to start at 1 mg at this time.

## 2021-10-27 NOTE — Telephone Encounter (Signed)
Patient stated that she saw you on 10/22/2021 and you wrote her a Rx for Lunesta. Stated that she has taken this in the past and had Lunesta 2mg  not 1mg .  Patient did not pick up from the pharmacy.  Patient is requesting for the Lunesta 2mg  to be called in to the pharmacy instead of the 1mg .   Please Advise.

## 2021-10-28 NOTE — Telephone Encounter (Signed)
LMOM to return call.

## 2021-10-29 NOTE — Telephone Encounter (Signed)
LMOM to return call.

## 2021-10-29 NOTE — Telephone Encounter (Signed)
Patient notified and agreed.  

## 2021-11-05 NOTE — Assessment & Plan Note (Signed)
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, 1.3 cm, with DCIS, margins negative, 0/4 lymph nodes negative, T1 cN0 stage IA pathologic stage, ER 100%, PR 100%, HER-2 negative ratio 1.39, Ki-67 20% (Originally 2 nodules were detected on screening mammogram 1.9 cm= fat necrosis and 8 mm IDC) Oncotype DX score 20, 13% risk of recurrence, intermediate risk Adjuvant radiation therapy started 07/20/2017completed 07/13/2016   09/02/2021:Right lumpectomy: Grade 2 ILC 3.1 cm, margins negative, LVI present, 4/6 lymph nodes macrometastases, 2 lymph nodes isolated tumor cells, ER 100%, PR 90%, Ki-67 25%, HER2 negative  Treatment plan: 1.Based on for enlarged lymph nodes, recommended systemic chemotherapy with CMF x8 cycles 2.adjuvant radiation therapy 3.For the continued antiestrogen therapy (previously anastrozole started 07/23/2016 switched to letrozole 2018 switched to tamoxifen 2018, switched back to anastrozole) hair loss was the main problem, may consider exemestane ------------------------------------------------------------------------------------------------------------------------------- 10/15/2021: CT CAP: No metastatic disease.  Descending and sigmoid colon diverticulosis, several tiny pulmonary nodules less than 5 mm, 2.1 cm left thyroid nodule  Current treatment: Cycle 2 CMF Chemo Toxicities:  Return to clinic in 3 week for toxicity check

## 2021-11-05 NOTE — Progress Notes (Signed)
Patient Care Team: [name], NP as PCP - General (Geriatric Medicine) Lorretta Harp, MD as PCP - Cardiology (Cardiology) Nicholas Lose, MD as Consulting Physician (Oncology) Erroll Luna, MD as Consulting Physician (General Surgery)  DIAGNOSIS:    ICD-10-CM   1. Malignant neoplasm of upper-outer quadrant of right breast in female, estrogen receptor positive ([place])  C50.411    Z17.0       SUMMARY OF ONCOLOGIC HISTORY: Oncology History  Breast cancer of upper-outer quadrant of left female breast ([place])  03/31/2016 Initial Diagnosis   Screening detected left breast mass, 2 nodules, 1.9 x 1.6 x 0.8 cm= fat necrosis; 8 x 7 x 7 mm= grade 2 IDC ER 100%, PR 100%, HER-2 negative ratio 1.39, Ki-67 20%   04/17/2016 Surgery   Left lumpectomy ([name]): IDC grade 2, 1.3 cm, with DCIS, margins negative, 0/4 lymph nodes negative, T1 cN0 stage IA pathologic stage, ER 100%, PR 100%, HER-2 negative ratio 1.39, Ki-67 20% Oncotype DX score 20, 13% ROR, intermediate risk   04/17/2016 Oncotype testing   Recurrence score: 20; ROR 15% (intermediate risk)    05/27/2016 - 07/13/2016 Radiation Therapy   Adjuvant radiation therapy [facility]): Left breast treated with breath hold to 50.4 Gy in 28 fractions at 1.8 Gy/fraction.  Left breast boosted to 10 Gy in 5 fractions at 2 Gy/fraction   07/23/2016 -  Anti-estrogen oral therapy   Anastrozole 1 mg switched to letrozole 04/22/2017 due to hair loss, switch to tamoxifen 10/22/2017 due to hair loss from letrozole as well   Malignant neoplasm of upper-outer quadrant of right breast in female, estrogen receptor positive ([place])  08/04/2021 Initial Diagnosis   Malignant neoplasm of upper-outer quadrant of right breast in female, estrogen receptor positive ([name])    Relapse/Recurrence   2.2 cm ILC ER 100%, PR 90%, Her 2 Neg, Ki 67: 25%, Size 2.5 cm   08/04/2021 Cancer Staging   Staging form: Breast, AJCC 8th Edition - Clinical: Stage IB (cT2, cN0, cM0, G2, ER+,  PR+, HER2-) - Signed by Nicholas Lose, MD on 08/04/2021 Stage prefix: Initial diagnosis Histologic grading system: 3 grade system    09/02/2021 Surgery   Right lumpectomy: Grade 2 ILC 3.1 cm, margins negative, LVI present, 4/6 lymph nodes macrometastases, 2 lymph nodes isolated tumor cells, ER 100%, PR 90%, Ki-67 25%, HER2 negative   10/17/2021 -  Chemotherapy   Patient is on Treatment Plan : BREAST Adjuvant CMF IV q21d       CHIEF COMPLIANT: CMF Cycle 2  INTERVAL HISTORY: Michele Page is a 65 y.o. with above-mentioned history of left breast cancer, currently on adjuvant chemotherapy with CMF. She presents to the clinic today for treatment.  She tolerated cycle 1 chemotherapy extremely well without any problems or concerns.  Did not have any nausea or vomiting.  ALLERGIES:  has No Known Allergies.  MEDICATIONS:  Current Outpatient Medications  Medication Sig Dispense Refill   aspirin EC 81 MG tablet Take 1 tablet (81 mg total) by mouth daily. 30 tablet 6   atorvastatin (LIPITOR) 20 MG tablet TAKE 1 TABLET EVERY DAY 90 tablet 1   eszopiclone (LUNESTA) 1 MG TABS tablet Take one tablet by mouth at bedtime as needed for sleep (take immediately before bedtime) 30 tablet 0   labetalol (NORMODYNE) 300 MG tablet TAKE 2 TABLETS THREE TIMES DAILY 540 tablet 1   NIFEdipine (PROCARDIA XL/NIFEDICAL XL) 60 MG 24 hr tablet Take 1 tablet (60 mg total) by mouth 2 (two) times  daily. 180 tablet 3   ondansetron (ZOFRAN) 8 MG tablet Take 1 tablet (8 mg total) by mouth 2 (two) times daily as needed for refractory nausea / vomiting. Start on day 3 after chemotherapy. 30 tablet 1   No current facility-administered medications for this visit.    PHYSICAL EXAMINATION: ECOG PERFORMANCE STATUS: 0 - Asymptomatic  Vitals:   11/06/21 0851  BP: (!) 130/58  Pulse: 82  Resp: 18  Temp: (!) 97.3 F (36.3 C)  SpO2: 100%   Filed Weights   11/06/21 0851  Weight: 205 lb 6.4 oz (93.2 kg)    LABORATORY DATA:   I have reviewed the data as listed CMP Latest Ref Rng & Units 10/24/2021 10/17/2021 10/14/2021  Glucose 70 - 99 mg/dL 96 95 -  BUN 8 - 23 mg/dL 19 25(H) -  Creatinine 0.44 - 1.00 mg/dL 1.27(H) 1.33(H) 1.40(H)  Sodium 135 - 145 mmol/L 141 141 -  Potassium 3.5 - 5.1 mmol/L 4.2 3.9 -  Chloride 98 - 111 mmol/L 109 106 -  CO2 22 - 32 mmol/L 24 24 -  Calcium 8.9 - 10.3 mg/dL 9.1 9.5 -  Total Protein 6.5 - 8.1 g/dL 6.7 7.3 -  Total Bilirubin 0.3 - 1.2 mg/dL 0.5 0.4 -  Alkaline Phos 38 - 126 U/L 96 118 -  AST 15 - 41 U/L 18 23 -  ALT 0 - 44 U/L 14 23 -    Lab Results  Component Value Date   WBC 3.4 (L) 11/06/2021   HGB 11.3 (L) 11/06/2021   HCT 34.2 (L) 11/06/2021   MCV 92.7 11/06/2021   PLT 312 11/06/2021   NEUTROABS 1.5 (L) 11/06/2021    ASSESSMENT & PLAN:  Malignant neoplasm of upper-outer quadrant of right breast in female, estrogen receptor positive ([place]) 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, 1.3 cm, with DCIS, margins negative, 0/4 lymph nodes negative, T1 cN0 stage IA pathologic stage, ER 100%, PR 100%, HER-2 negative ratio 1.39, Ki-67 20% (Originally 2 nodules were detected on screening mammogram 1.9 cm= fat necrosis and 8 mm IDC) Oncotype DX score 20, 13% risk of recurrence, intermediate risk Adjuvant radiation therapy started 07/20/2017completed 07/13/2016     09/02/2021:Right lumpectomy: Grade 2 ILC 3.1 cm, margins negative, LVI present, 4/6 lymph nodes macrometastases, 2 lymph nodes isolated tumor cells, ER 100%, PR 90%, Ki-67 25%, HER2 negative   Treatment plan: 1.  Based on for enlarged lymph nodes, recommended systemic chemotherapy with CMF x8 cycles 2. adjuvant radiation therapy 3.  For the continued antiestrogen therapy (previously anastrozole started 07/23/2016 switched to letrozole 2018 switched to tamoxifen 2018, switched back to anastrozole) hair loss was the main problem, may consider  exemestane ------------------------------------------------------------------------------------------------------------------------------- 10/15/2021: CT CAP: No metastatic disease.  Descending and sigmoid colon diverticulosis, several tiny pulmonary nodules less than 5 mm, 2.1 cm left thyroid nodule   Current treatment: Cycle 2 CMF Chemo Toxicities: Tolerating chemotherapy extremely well without any side effects. Leukopenia: Reduce the dosage of chemotherapy for cycle 2. If the white count continues to decline then we may have to give her Neulasta injection.  Return to clinic in 3 week for toxicity check    No orders of the defined types were placed in this encounter.  The patient has a good understanding of the overall plan. she agrees with it. she will call with any problems that may develop before the next visit here.  Total time spent: 20 mins including face to face time and time spent for planning, charting and  coordination of care  Rulon Eisenmenger, MD, MPH 11/06/2021  I, Thana Ates, am acting as scribe for Dr. Nicholas Lose.  I have reviewed the above documentation for accuracy and completeness, and I agree with the above.

## 2021-11-06 ENCOUNTER — Inpatient Hospital Stay: Payer: Medicare HMO

## 2021-11-06 ENCOUNTER — Inpatient Hospital Stay: Payer: Medicare HMO | Admitting: Hematology and Oncology

## 2021-11-06 ENCOUNTER — Other Ambulatory Visit: Payer: Self-pay

## 2021-11-06 DIAGNOSIS — Z170 Estrogen receptor positive status [ER+]: Secondary | ICD-10-CM

## 2021-11-06 DIAGNOSIS — C50411 Malignant neoplasm of upper-outer quadrant of right female breast: Secondary | ICD-10-CM

## 2021-11-06 DIAGNOSIS — Z95828 Presence of other vascular implants and grafts: Secondary | ICD-10-CM

## 2021-11-06 DIAGNOSIS — Z5111 Encounter for antineoplastic chemotherapy: Secondary | ICD-10-CM | POA: Diagnosis not present

## 2021-11-06 LAB — CBC WITH DIFFERENTIAL (CANCER CENTER ONLY)
Abs Immature Granulocytes: 0.01 10*3/uL (ref 0.00–0.07)
Basophils Absolute: 0 10*3/uL (ref 0.0–0.1)
Basophils Relative: 1 %
Eosinophils Absolute: 0.2 10*3/uL (ref 0.0–0.5)
Eosinophils Relative: 5 %
HCT: 34.2 % — ABNORMAL LOW (ref 36.0–46.0)
Hemoglobin: 11.3 g/dL — ABNORMAL LOW (ref 12.0–15.0)
Immature Granulocytes: 0 %
Lymphocytes Relative: 32 %
Lymphs Abs: 1.1 10*3/uL (ref 0.7–4.0)
MCH: 30.6 pg (ref 26.0–34.0)
MCHC: 33 g/dL (ref 30.0–36.0)
MCV: 92.7 fL (ref 80.0–100.0)
Monocytes Absolute: 0.6 10*3/uL (ref 0.1–1.0)
Monocytes Relative: 17 %
Neutro Abs: 1.5 10*3/uL — ABNORMAL LOW (ref 1.7–7.7)
Neutrophils Relative %: 45 %
Platelet Count: 312 10*3/uL (ref 150–400)
RBC: 3.69 MIL/uL — ABNORMAL LOW (ref 3.87–5.11)
RDW: 13 % (ref 11.5–15.5)
WBC Count: 3.4 10*3/uL — ABNORMAL LOW (ref 4.0–10.5)
nRBC: 0 % (ref 0.0–0.2)

## 2021-11-06 LAB — CMP (CANCER CENTER ONLY)
ALT: 15 U/L (ref 0–44)
AST: 18 U/L (ref 15–41)
Albumin: 4.4 g/dL (ref 3.5–5.0)
Alkaline Phosphatase: 102 U/L (ref 38–126)
Anion gap: 6 (ref 5–15)
BUN: 17 mg/dL (ref 8–23)
CO2: 27 mmol/L (ref 22–32)
Calcium: 9.5 mg/dL (ref 8.9–10.3)
Chloride: 108 mmol/L (ref 98–111)
Creatinine: 1.34 mg/dL — ABNORMAL HIGH (ref 0.44–1.00)
GFR, Estimated: 44 mL/min — ABNORMAL LOW (ref >60)
Glucose, Bld: 93 mg/dL (ref 70–99)
Potassium: 4.1 mmol/L (ref 3.5–5.1)
Sodium: 141 mmol/L (ref 135–145)
Total Bilirubin: 0.4 mg/dL (ref 0.3–1.2)
Total Protein: 7 g/dL (ref 6.5–8.1)

## 2021-11-06 MED ORDER — SODIUM CHLORIDE 0.9 % IV SOLN
500.0000 mg/m2 | Freq: Once | INTRAVENOUS | Status: AC
  Administered 2021-11-06: 11:00:00 1000 mg via INTRAVENOUS
  Filled 2021-11-06: qty 50

## 2021-11-06 MED ORDER — SODIUM CHLORIDE 0.9 % IV SOLN
10.0000 mg | Freq: Once | INTRAVENOUS | Status: AC
  Administered 2021-11-06: 10:00:00 10 mg via INTRAVENOUS
  Filled 2021-11-06: qty 10

## 2021-11-06 MED ORDER — FLUOROURACIL CHEMO INJECTION 2.5 GM/50ML
400.0000 mg/m2 | Freq: Once | INTRAVENOUS | Status: AC
  Administered 2021-11-06: 11:00:00 800 mg via INTRAVENOUS
  Filled 2021-11-06: qty 16

## 2021-11-06 MED ORDER — PALONOSETRON HCL INJECTION 0.25 MG/5ML
0.2500 mg | Freq: Once | INTRAVENOUS | Status: AC
  Administered 2021-11-06: 10:00:00 0.25 mg via INTRAVENOUS
  Filled 2021-11-06: qty 5

## 2021-11-06 MED ORDER — HEPARIN SOD (PORK) LOCK FLUSH 100 UNIT/ML IV SOLN
500.0000 [IU] | Freq: Once | INTRAVENOUS | Status: AC | PRN
  Administered 2021-11-06: 12:00:00 500 [IU]

## 2021-11-06 MED ORDER — SODIUM CHLORIDE 0.9% FLUSH
10.0000 mL | INTRAVENOUS | Status: DC | PRN
  Administered 2021-11-06: 12:00:00 10 mL

## 2021-11-06 MED ORDER — METHOTREXATE SODIUM (PF) CHEMO INJECTION 250 MG/10ML
30.0000 mg/m2 | Freq: Once | INTRAMUSCULAR | Status: AC
  Administered 2021-11-06: 11:00:00 60 mg via INTRAVENOUS
  Filled 2021-11-06: qty 2.4

## 2021-11-06 MED ORDER — SODIUM CHLORIDE 0.9% FLUSH
10.0000 mL | Freq: Once | INTRAVENOUS | Status: AC
Start: 2021-11-06 — End: 2021-11-06
  Administered 2021-11-06: 09:00:00 10 mL

## 2021-11-06 MED ORDER — SODIUM CHLORIDE 0.9 % IV SOLN: Freq: Once | INTRAVENOUS | Status: AC

## 2021-11-06 NOTE — Patient Instructions (Signed)
[facility]  Discharge Instructions: Thank you for choosing [facility] to provide your oncology and hematology care.   If you have a lab appointment with the [facility], please go directly to the [facility] and check in at the registration area.   Wear comfortable clothing and clothing appropriate for easy access to any Portacath or PICC line.   We strive to give you quality time with your provider. You may need to reschedule your appointment if you arrive late (15 or more minutes).  Arriving late affects you and other patients whose appointments are after yours.  Also, if you miss three or more appointments without notifying the office, you may be dismissed from the clinic at the providers discretion.      For prescription refill requests, have your pharmacy contact our office and allow 72 hours for refills to be completed.    Today you received the following chemotherapy and/or immunotherapy agents Cytoxan, Methotrexate and 5 FU      To help prevent nausea and vomiting after your treatment, we encourage you to take your nausea medication as directed.  BELOW ARE SYMPTOMS THAT SHOULD BE REPORTED IMMEDIATELY: *FEVER GREATER THAN 100.4 F (38 C) OR HIGHER *CHILLS OR SWEATING *NAUSEA AND VOMITING THAT IS NOT CONTROLLED WITH YOUR NAUSEA MEDICATION *UNUSUAL SHORTNESS OF BREATH *UNUSUAL BRUISING OR BLEEDING *URINARY PROBLEMS (pain or burning when urinating, or frequent urination) *BOWEL PROBLEMS (unusual diarrhea, constipation, pain near the anus) TENDERNESS IN MOUTH AND THROAT WITH OR WITHOUT PRESENCE OF ULCERS (sore throat, sores in mouth, or a toothache) UNUSUAL RASH, SWELLING OR PAIN  UNUSUAL VAGINAL DISCHARGE OR ITCHING   Items with * indicate a potential emergency and should be followed up as soon as possible or go to the Emergency Department if any problems should occur.  Please show the CHEMOTHERAPY ALERT CARD or IMMUNOTHERAPY ALERT  CARD at check-in to the Emergency Department and triage nurse.  Should you have questions after your visit or need to cancel or reschedule your appointment, please contact [facility]  Dept: [phone]  and follow the prompts.  Office hours are 8:00 a.m. to 4:30 p.m. Monday - Friday. Please note that voicemails left after 4:00 p.m. may not be returned until the following business day.  We are closed weekends and major holidays. You have access to a nurse at all times for urgent questions. Please call the main number to the clinic Dept: [phone] and follow the prompts.   For any non-urgent questions, you may also contact your provider using MyChart. We now offer e-Visits for anyone 51 and older to request care online for non-urgent symptoms. For details visit [email or web address].   Also download the MyChart app! Go to the app store, search "MyChart", open the app, select [facility], and log in with your MyChart username and password.  Due to Covid, a mask is required upon entering the hospital/clinic. If you do not have a mask, one will be given to you upon arrival. For doctor visits, patients may have 1 support person aged 78 or older with them. For treatment visits, patients cannot have anyone with them due to current Covid guidelines and our immunocompromised population.

## 2021-11-20 ENCOUNTER — Encounter: Payer: Self-pay | Admitting: Nurse Practitioner

## 2021-11-20 ENCOUNTER — Other Ambulatory Visit: Payer: Self-pay

## 2021-11-20 ENCOUNTER — Ambulatory Visit (INDEPENDENT_AMBULATORY_CARE_PROVIDER_SITE_OTHER): Payer: Medicare HMO | Admitting: Nurse Practitioner

## 2021-11-20 VITALS — BP 124/78 | HR 69 | Temp 97.2°F | Ht 62.0 in | Wt 203.4 lb

## 2021-11-20 DIAGNOSIS — J3489 Other specified disorders of nose and nasal sinuses: Secondary | ICD-10-CM | POA: Diagnosis not present

## 2021-11-20 DIAGNOSIS — M109 Gout, unspecified: Secondary | ICD-10-CM

## 2021-11-20 NOTE — Progress Notes (Signed)
Careteam: Patient Care Team: [name], NP as PCP - General (Geriatric Medicine) Lorretta Harp, MD as PCP - Cardiology (Cardiology) Nicholas Lose, MD as Consulting Physician (Oncology) Erroll Luna, MD as Consulting Physician (General Surgery)  PLACE OF SERVICE:  [facility] Directive information    No Known Allergies  Chief Complaint  Patient presents with   Acute Visit    Complains of Runny nose for about a month and Left Hand/thumb Numbness for about 6 months.      HPI: Patient is a 66 y.o. female due to thumb pain and runny nose. Reports or about 6 months has been having pain at the base of left thumb.  9/10 after she presses on it.  Movement makes it worse.  Tender and burning pain. Has happened before. Was diagnosised with gout.  She was on gout medication before but stopped taking medication due to not having pain.  She is taking chemo now and therefor does not take a lot of extra medication.   Reports left nare has been runny for about 1 month. Clear drainage.  Reports she does not have any body aches or pains, feels fine.  No nasal congestion, no sore throat or itchy throat. No itchy eyes.  Feels like it may be a reaction to chemo.   Review of Systems:  Review of Systems  Constitutional:  Negative for chills, fever and weight loss.  HENT:  Negative for tinnitus.        Clear drainage from left nare  Respiratory:  Negative for cough, sputum production and shortness of breath.   Cardiovascular:  Negative for chest pain, palpitations and leg swelling.  Gastrointestinal:  Negative for abdominal pain, constipation, diarrhea and heartburn.  Genitourinary:  Negative for dysuria, frequency and urgency.  Musculoskeletal:  Positive for joint pain. Negative for back pain, falls and myalgias.  Skin: Negative.   Neurological:  Negative for dizziness and headaches.  Psychiatric/Behavioral:  Negative for depression and memory loss. The patient  does not have insomnia.    Past Medical History:  Diagnosis Date   Anemia    past hx many yrs ago    Arthritis    lt knee   Breast cancer ([facility])    Breast cancer of upper-outer quadrant of left female breast ([facility]) 04/02/2016   Chronic kidney disease    CKD   Colon polyps    COPD (chronic obstructive pulmonary disease) ([name])    GERD (gastroesophageal reflux disease)    years ago- not current    Hyperlipidemia    Hypertension    Multiple sclerosis ([facility])    Personal history of chemotherapy    Personal history of radiation therapy    Stroke ([facility])    2013, mild cognitive deficits   Vision abnormalities    Past Surgical History:  Procedure Laterality Date   BREAST BIOPSY Left 03/31/2016   BREAST BIOPSY Right 07/24/2021   BREAST LUMPECTOMY Left 04/17/2016   BREAST LUMPECTOMY WITH RADIOACTIVE SEED AND SENTINEL LYMPH NODE BIOPSY Right 09/02/2021   Procedure: RIGHT BREAST LUMPECTOMY WITH RADIOACTIVE SEED AND SENTINEL LYMPH NODE BIOPSY;  Surgeon: Erroll Luna, MD;  Location: [facility];  Service: General;  Laterality: Right;   COLONOSCOPY     HYSTEROSCOPY WITH D & C N/A 12/19/2015   Procedure: DILATATION AND CURETTAGE /HYSTEROSCOPY;  Surgeon: Linda Hedges, DO;  Location: [facility];  Service: Gynecology;  Laterality: N/A;   POLYPECTOMY     PORTACATH PLACEMENT Right 10/16/2021  Procedure: INSERTION PORT-A-CATH;  Surgeon: Erroll Luna, MD;  Location: [facility];  Service: General;  Laterality: Right;   RADIOACTIVE SEED GUIDED PARTIAL MASTECTOMY WITH AXILLARY SENTINEL LYMPH NODE BIOPSY Left 04/17/2016   Procedure: RADIOACTIVE SEED GUIDED PARTIAL MASTECTOMY WITH AXILLARY SENTINEL LYMPH NODE BIOPSY;  Surgeon: Erroll Luna, MD;  Location: [facility];  Service: General;  Laterality: Left;  RADIOACTIVE SEED GUIDED PARTIAL MASTECTOMY WITH AXILLARY SENTINEL LYMPH NODE BIOPSY    TUBAL LIGATION     VAGINAL DELIVERY     x3   Social History:    reports that she quit smoking about 17 years ago. Her smoking use included cigarettes. She has a 12.50 pack-year smoking history. She has never used smokeless tobacco. She reports current alcohol use. She reports that she does not use drugs.  Family History  Problem Relation Age of Onset   Stroke Sister    Hypertension Mother    Stroke Mother    Alcohol abuse Brother    HIV/AIDS Brother    Colon cancer Neg Hx    Colon polyps Neg Hx    Esophageal cancer Neg Hx    Rectal cancer Neg Hx    Stomach cancer Neg Hx     Medications: Patient's Medications  New Prescriptions   No medications on file  Previous Medications   ASPIRIN EC 81 MG TABLET    Take 1 tablet (81 mg total) by mouth daily.   ATORVASTATIN (LIPITOR) 20 MG TABLET    TAKE 1 TABLET EVERY DAY   ESZOPICLONE (LUNESTA) 1 MG TABS TABLET    Take one tablet by mouth at bedtime as needed for sleep (take immediately before bedtime)   LABETALOL (NORMODYNE) 300 MG TABLET    TAKE 2 TABLETS THREE TIMES DAILY   NIFEDIPINE (PROCARDIA XL/NIFEDICAL XL) 60 MG 24 HR TABLET    Take 1 tablet (60 mg total) by mouth 2 (two) times daily.   ONDANSETRON (ZOFRAN) 8 MG TABLET    Take 1 tablet (8 mg total) by mouth 2 (two) times daily as needed for refractory nausea / vomiting. Start on day 3 after chemotherapy.  Modified Medications   No medications on file  Discontinued Medications   No medications on file    Physical Exam:  Vitals:   11/20/21 1307  BP: 124/78  Pulse: 69  Temp: (!) 97.2 F (36.2 C)  TempSrc: Skin  SpO2: 97%  Weight: 203 lb 6.4 oz (92.3 kg)  Height: 5\' 2"  (1.575 m)   Body mass index is 37.2 kg/m. Wt Readings from Last 3 Encounters:  11/20/21 203 lb 6.4 oz (92.3 kg)  11/06/21 205 lb 6.4 oz (93.2 kg)  10/24/21 204 lb (92.5 kg)    Physical Exam Constitutional:      General: She is not in acute distress.    Appearance: She is well-developed. She is not diaphoretic.  HENT:     Head: Normocephalic and atraumatic.      Right Ear: Tympanic membrane, ear canal and external ear normal.     Left Ear: Tympanic membrane, ear canal and external ear normal.     Nose: Rhinorrhea present. No congestion.     Mouth/Throat:     Mouth: Mucous membranes are moist.     Pharynx: No oropharyngeal exudate.  Eyes:     Conjunctiva/sclera: Conjunctivae normal.     Pupils: Pupils are equal, round, and reactive to light.  Cardiovascular:     Rate and Rhythm: Normal rate and regular rhythm.  Heart sounds: Normal heart sounds.  Pulmonary:     Effort: Pulmonary effort is normal.     Breath sounds: Normal breath sounds.  Abdominal:     General: Bowel sounds are normal.     Palpations: Abdomen is soft.  Musculoskeletal:     Cervical back: Normal range of motion and neck supple.     Right lower leg: No edema.     Left lower leg: No edema.  Skin:    General: Skin is warm and dry.  Neurological:     Mental Status: She is alert.  Psychiatric:        Mood and Affect: Mood normal.    Labs reviewed: Basic Metabolic Panel: Recent Labs    10/17/21 1237 10/24/21 1012 11/06/21 0808  NA 141 141 141  K 3.9 4.2 4.1  CL 106 109 108  CO2 24 24 27   GLUCOSE 95 96 93  BUN 25* 19 17  CREATININE 1.33* 1.27* 1.34*  CALCIUM 9.5 9.1 9.5   Liver Function Tests: Recent Labs    10/17/21 1237 10/24/21 1012 11/06/21 0808  AST 23 18 18   ALT 23 14 15   ALKPHOS 118 96 102  BILITOT 0.4 0.5 0.4  PROT 7.3 6.7 7.0  ALBUMIN 4.1 4.0 4.4   No results for input(s): LIPASE, AMYLASE in the last 8760 hours. No results for input(s): AMMONIA in the last 8760 hours. CBC: Recent Labs    10/17/21 1237 10/24/21 1012 11/06/21 0808  WBC 10.1 4.2 3.4*  NEUTROABS 7.3 2.6 1.5*  HGB 11.2* 10.7* 11.3*  HCT 32.7* 32.0* 34.2*  MCV 91.9 92.8 92.7  PLT 272 264 312   Lipid Panel: Recent Labs    04/18/21 1348  CHOL 139  HDL 66  LDLCALC 58  TRIG 73  CHOLHDL 2.1   TSH: No results for input(s): TSH in the last 8760 hours. A1C: Lab  Results  Component Value Date   HGBA1C 5.8 (H) 05/13/2020     Assessment/Plan 1. Rhinorrhea -OTC zyrtec 10 mg or claritin 10 mg by mouth daily recommended.   2. Gout of left hand, unspecified cause, unspecified chronicity -she has stopped her allopurinol for gout, responded well to prednisone and allopurinol well in the past but she does not want to take without making sure it is okay for her to take with her chemotherapy. Msg sent to oncologist to collaborate care.   Next appt: 01/22/2022 Carlos American. [place], [facility] [phone]

## 2021-11-20 NOTE — Patient Instructions (Signed)
Can use zyrtec 10 mg or claritin 10 mg by mouth daily for runny nose  -can use generics

## 2021-11-27 MED FILL — Dexamethasone Sodium Phosphate Inj 100 MG/10ML: INTRAMUSCULAR | Qty: 1 | Status: AC

## 2021-11-28 ENCOUNTER — Inpatient Hospital Stay: Payer: Medicare HMO | Attending: Hematology and Oncology

## 2021-11-28 ENCOUNTER — Other Ambulatory Visit: Payer: Medicare HMO

## 2021-11-28 ENCOUNTER — Ambulatory Visit: Payer: Medicare HMO | Admitting: Hematology and Oncology

## 2021-11-28 ENCOUNTER — Ambulatory Visit: Payer: Medicare HMO

## 2021-11-28 ENCOUNTER — Inpatient Hospital Stay: Payer: Medicare HMO

## 2021-11-28 ENCOUNTER — Encounter: Payer: Self-pay | Admitting: Adult Health

## 2021-11-28 ENCOUNTER — Other Ambulatory Visit: Payer: Self-pay

## 2021-11-28 ENCOUNTER — Inpatient Hospital Stay: Payer: Medicare HMO | Admitting: Adult Health

## 2021-11-28 VITALS — BP 184/80 | HR 68 | Temp 97.3°F | Resp 17 | Wt 203.5 lb

## 2021-11-28 VITALS — BP 157/87 | HR 67

## 2021-11-28 DIAGNOSIS — C50411 Malignant neoplasm of upper-outer quadrant of right female breast: Secondary | ICD-10-CM

## 2021-11-28 DIAGNOSIS — C50412 Malignant neoplasm of upper-outer quadrant of left female breast: Secondary | ICD-10-CM | POA: Insufficient documentation

## 2021-11-28 DIAGNOSIS — Z95828 Presence of other vascular implants and grafts: Secondary | ICD-10-CM

## 2021-11-28 DIAGNOSIS — Z170 Estrogen receptor positive status [ER+]: Secondary | ICD-10-CM

## 2021-11-28 DIAGNOSIS — Z5111 Encounter for antineoplastic chemotherapy: Secondary | ICD-10-CM | POA: Insufficient documentation

## 2021-11-28 LAB — CBC WITH DIFFERENTIAL (CANCER CENTER ONLY)
Abs Immature Granulocytes: 0.04 10*3/uL (ref 0.00–0.07)
Basophils Absolute: 0 10*3/uL (ref 0.0–0.1)
Basophils Relative: 1 %
Eosinophils Absolute: 0.1 10*3/uL (ref 0.0–0.5)
Eosinophils Relative: 3 %
HCT: 32.7 % — ABNORMAL LOW (ref 36.0–46.0)
Hemoglobin: 11.1 g/dL — ABNORMAL LOW (ref 12.0–15.0)
Immature Granulocytes: 1 %
Lymphocytes Relative: 37 %
Lymphs Abs: 1.4 10*3/uL (ref 0.7–4.0)
MCH: 31.1 pg (ref 26.0–34.0)
MCHC: 33.9 g/dL (ref 30.0–36.0)
MCV: 91.6 fL (ref 80.0–100.0)
Monocytes Absolute: 0.5 10*3/uL (ref 0.1–1.0)
Monocytes Relative: 13 %
Neutro Abs: 1.7 10*3/uL (ref 1.7–7.7)
Neutrophils Relative %: 45 %
Platelet Count: 300 10*3/uL (ref 150–400)
RBC: 3.57 MIL/uL — ABNORMAL LOW (ref 3.87–5.11)
RDW: 13.1 % (ref 11.5–15.5)
WBC Count: 3.9 10*3/uL — ABNORMAL LOW (ref 4.0–10.5)
nRBC: 0 % (ref 0.0–0.2)

## 2021-11-28 LAB — CMP (CANCER CENTER ONLY)
ALT: 21 U/L (ref 0–44)
AST: 25 U/L (ref 15–41)
Albumin: 4.3 g/dL (ref 3.5–5.0)
Alkaline Phosphatase: 94 U/L (ref 38–126)
Anion gap: 6 (ref 5–15)
BUN: 21 mg/dL (ref 8–23)
CO2: 27 mmol/L (ref 22–32)
Calcium: 9.4 mg/dL (ref 8.9–10.3)
Chloride: 107 mmol/L (ref 98–111)
Creatinine: 1.39 mg/dL — ABNORMAL HIGH (ref 0.44–1.00)
GFR, Estimated: 42 mL/min — ABNORMAL LOW (ref >60)
Glucose, Bld: 125 mg/dL — ABNORMAL HIGH (ref 70–99)
Potassium: 3.9 mmol/L (ref 3.5–5.1)
Sodium: 140 mmol/L (ref 135–145)
Total Bilirubin: 0.4 mg/dL (ref 0.3–1.2)
Total Protein: 6.9 g/dL (ref 6.5–8.1)

## 2021-11-28 MED ORDER — HEPARIN SOD (PORK) LOCK FLUSH 100 UNIT/ML IV SOLN
500.0000 [IU] | Freq: Once | INTRAVENOUS | Status: AC | PRN
  Administered 2021-11-28: 500 [IU]

## 2021-11-28 MED ORDER — SODIUM CHLORIDE 0.9 % IV SOLN
500.0000 mg/m2 | Freq: Once | INTRAVENOUS | Status: AC
  Administered 2021-11-28: 1000 mg via INTRAVENOUS
  Filled 2021-11-28: qty 50

## 2021-11-28 MED ORDER — SODIUM CHLORIDE 0.9 % IV SOLN
10.0000 mg | Freq: Once | INTRAVENOUS | Status: AC
  Administered 2021-11-28: 10 mg via INTRAVENOUS
  Filled 2021-11-28: qty 10

## 2021-11-28 MED ORDER — SODIUM CHLORIDE 0.9% FLUSH
10.0000 mL | INTRAVENOUS | Status: DC | PRN
  Administered 2021-11-28: 10 mL

## 2021-11-28 MED ORDER — SODIUM CHLORIDE 0.9% FLUSH
10.0000 mL | Freq: Once | INTRAVENOUS | Status: AC
  Administered 2021-11-28: 10 mL

## 2021-11-28 MED ORDER — PALONOSETRON HCL INJECTION 0.25 MG/5ML
0.2500 mg | Freq: Once | INTRAVENOUS | Status: AC
  Administered 2021-11-28: 0.25 mg via INTRAVENOUS
  Filled 2021-11-28: qty 5

## 2021-11-28 MED ORDER — METHOTREXATE SODIUM (PF) CHEMO INJECTION 250 MG/10ML
30.0000 mg/m2 | Freq: Once | INTRAMUSCULAR | Status: AC
  Administered 2021-11-28: 60 mg via INTRAVENOUS
  Filled 2021-11-28: qty 2.4

## 2021-11-28 MED ORDER — FLUOROURACIL CHEMO INJECTION 2.5 GM/50ML
400.0000 mg/m2 | Freq: Once | INTRAVENOUS | Status: AC
  Administered 2021-11-28: 800 mg via INTRAVENOUS
  Filled 2021-11-28: qty 16

## 2021-11-28 MED ORDER — SODIUM CHLORIDE 0.9 % IV SOLN: Freq: Once | INTRAVENOUS | Status: AC

## 2021-11-28 NOTE — Assessment & Plan Note (Addendum)
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, 1.3 cm, with DCIS, margins negative, 0/4 lymph nodes negative, T1 cN0 stage IA pathologic stage, ER 100%, PR 100%, HER-2 negative ratio 1.39, Ki-67 20% °(Originally 2 nodules were detected on screening mammogram 1.9 cm= fat necrosis and 8 mm IDC) °Oncotype DX score 20, 13% risk of recurrence, intermediate risk °Adjuvant radiation therapy started 07/20/2017completed 07/13/2016 °  °  °09/02/2021:Right lumpectomy: Grade 2 ILC 3.1 cm, margins negative, LVI present, 4/6 lymph nodes macrometastases, 2 lymph nodes isolated tumor cells, ER 100%, PR 90%, Ki-67 25%, HER2 negative °  °Treatment plan: °1.  Based on for enlarged lymph nodes, recommended systemic chemotherapy with CMF x6 cycles °2. adjuvant radiation therapy °3.  For the continued antiestrogen therapy (previously anastrozole started 07/23/2016 switched to letrozole 2018 switched to tamoxifen 2018, switched back to anastrozole) hair loss was the main problem, may consider exemestane °------------------------------------------------------------------------------------------------------------------------------- °10/15/2021: CT CAP: No metastatic disease.  Descending and sigmoid colon diverticulosis, several tiny pulmonary nodules less than 5 mm, 2.1 cm left thyroid nodule °  °Current treatment: Cycle 3 CMF °Chemo Toxicities: °Mild fatigue.  Tolerating well otherwise.   °Return to clinic in 3 weeks for toxicity check °

## 2021-11-28 NOTE — Patient Instructions (Signed)
[facility]  Discharge Instructions: Thank you for choosing [facility] to provide your oncology and hematology care.   If you have a lab appointment with the [facility], please go directly to the [facility] and check in at the registration area.   Wear comfortable clothing and clothing appropriate for easy access to any Portacath or PICC line.   We strive to give you quality time with your provider. You may need to reschedule your appointment if you arrive late (15 or more minutes).  Arriving late affects you and other patients whose appointments are after yours.  Also, if you miss three or more appointments without notifying the office, you may be dismissed from the clinic at the providers discretion.      For prescription refill requests, have your pharmacy contact our office and allow 72 hours for refills to be completed.    Today you received the following chemotherapy and/or immunotherapy agents Cytoxan, Methotrexate, fluorouracil      To help prevent nausea and vomiting after your treatment, we encourage you to take your nausea medication as directed.  BELOW ARE SYMPTOMS THAT SHOULD BE REPORTED IMMEDIATELY: *FEVER GREATER THAN 100.4 F (38 C) OR HIGHER *CHILLS OR SWEATING *NAUSEA AND VOMITING THAT IS NOT CONTROLLED WITH YOUR NAUSEA MEDICATION *UNUSUAL SHORTNESS OF BREATH *UNUSUAL BRUISING OR BLEEDING *URINARY PROBLEMS (pain or burning when urinating, or frequent urination) *BOWEL PROBLEMS (unusual diarrhea, constipation, pain near the anus) TENDERNESS IN MOUTH AND THROAT WITH OR WITHOUT PRESENCE OF ULCERS (sore throat, sores in mouth, or a toothache) UNUSUAL RASH, SWELLING OR PAIN  UNUSUAL VAGINAL DISCHARGE OR ITCHING   Items with * indicate a potential emergency and should be followed up as soon as possible or go to the Emergency Department if any problems should occur.  Please show the CHEMOTHERAPY ALERT CARD or IMMUNOTHERAPY  ALERT CARD at check-in to the Emergency Department and triage nurse.  Should you have questions after your visit or need to cancel or reschedule your appointment, please contact [facility]  Dept: [phone]  and follow the prompts.  Office hours are 8:00 a.m. to 4:30 p.m. Monday - Friday. Please note that voicemails left after 4:00 p.m. may not be returned until the following business day.  We are closed weekends and major holidays. You have access to a nurse at all times for urgent questions. Please call the main number to the clinic Dept: [phone] and follow the prompts.   For any non-urgent questions, you may also contact your provider using MyChart. We now offer e-Visits for anyone 72 and older to request care online for non-urgent symptoms. For details visit [email or web address].   Also download the MyChart app! Go to the app store, search "MyChart", open the app, select [facility], and log in with your MyChart username and password.  Due to Covid, a mask is required upon entering the hospital/clinic. If you do not have a mask, one will be given to you upon arrival. For doctor visits, patients may have 1 support person aged 24 or older with them. For treatment visits, patients cannot have anyone with them due to current Covid guidelines and our immunocompromised population.

## 2021-11-28 NOTE — Progress Notes (Signed)
[facility] Follow up:    Michele Chandler, NP [place] [address]   DIAGNOSIS:  Cancer Staging  Breast cancer of upper-outer quadrant of left female breast [facility]) Staging form: Breast, AJCC 7th Edition - Clinical stage from 04/08/2016: Stage IA (T1b, N0, M0) - Unsigned Staged by: Pathologist and managing physician Laterality: Left Estrogen receptor status: Positive Progesterone receptor status: Positive HER2 status: Negative Stage used in treatment planning: Yes National guidelines used in treatment planning: Yes Type of national guideline used in treatment planning: NCCN Staging comments: Staged at breast conference on 5.24.17 - Pathologic stage from 04/17/2016: Stage IA (T1c, N0, cM0) - Signed by Holley Bouche, NP on 10/09/2016 Laterality: Left Estrogen receptor status: Positive Progesterone receptor status: Positive HER2 status: Negative  Malignant neoplasm of upper-outer quadrant of right breast in female, estrogen receptor positive ([place]) Staging form: Breast, AJCC 8th Edition - Clinical: Stage IB (cT2, cN0, cM0, G2, ER+, PR+, HER2-) - Signed by Nicholas Lose, MD on 08/04/2021 Stage prefix: Initial diagnosis Histologic grading system: 3 grade system   SUMMARY OF ONCOLOGIC HISTORY: Oncology History  Breast cancer of upper-outer quadrant of left female breast ([place])  03/31/2016 Initial Diagnosis   Screening detected left breast mass, 2 nodules, 1.9 x 1.6 x 0.8 cm= fat necrosis; 8 x 7 x 7 mm= grade 2 IDC ER 100%, PR 100%, HER-2 negative ratio 1.39, Ki-67 20%   04/17/2016 Surgery   Left lumpectomy ([name]): IDC grade 2, 1.3 cm, with DCIS, margins negative, 0/4 lymph nodes negative, T1 cN0 stage IA pathologic stage, ER 100%, PR 100%, HER-2 negative ratio 1.39, Ki-67 20% Oncotype DX score 20, 13% ROR, intermediate risk   04/17/2016 Oncotype testing   Recurrence score: 20; ROR 15% (intermediate risk)    05/27/2016 - 07/13/2016 Radiation  Therapy   Adjuvant radiation therapy [facility]): Left breast treated with breath hold to 50.4 Gy in 28 fractions at 1.8 Gy/fraction.  Left breast boosted to 10 Gy in 5 fractions at 2 Gy/fraction   07/23/2016 -  Anti-estrogen oral therapy   Anastrozole 1 mg switched to letrozole 04/22/2017 due to hair loss, switch to tamoxifen 10/22/2017 due to hair loss from letrozole as well   Malignant neoplasm of upper-outer quadrant of right breast in female, estrogen receptor positive ([place])  08/04/2021 Initial Diagnosis   Malignant neoplasm of upper-outer quadrant of right breast in female, estrogen receptor positive ([name])    Relapse/Recurrence   2.2 cm ILC ER 100%, PR 90%, Her 2 Neg, Ki 67: 25%, Size 2.5 cm   08/04/2021 Cancer Staging   Staging form: Breast, AJCC 8th Edition - Clinical: Stage IB (cT2, cN0, cM0, G2, ER+, PR+, HER2-) - Signed by Nicholas Lose, MD on 08/04/2021 Stage prefix: Initial diagnosis Histologic grading system: 3 grade system    09/02/2021 Surgery   Right lumpectomy: Grade 2 ILC 3.1 cm, margins negative, LVI present, 4/6 lymph nodes macrometastases, 2 lymph nodes isolated tumor cells, ER 100%, PR 90%, Ki-67 25%, HER2 negative   10/17/2021 -  Chemotherapy   Patient is on Treatment Plan : BREAST Adjuvant CMF IV q21d       CURRENT THERAPY: CMF  INTERVAL HISTORY: Michele Page 66 y.o. female returns for evaluation prior to receiving CMF chemotherapy.  She is doing quite well.  She is mildly fatigued, but otherwise continues to work and has experienced no nausea, vomiting, or other concerns.     Patient Active Problem List   Diagnosis Date  Noted   Port-A-Cath in place 11/06/2021   Malignant neoplasm of upper-outer quadrant of right breast in female, estrogen receptor positive ([place]) 08/04/2021   Class 2 severe obesity due to excess calories with serious comorbidity and body mass index (BMI) of 37.0 to 37.9 in adult [facility]) 11/18/2020   Neck pain on right side 06/01/2018   History of  breast cancer 11/25/2017   Breast cancer of upper-outer quadrant of left female breast ([place]) 04/02/2016   Essential hypertension 01/10/2016   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 01/10/2016   Gastroesophageal reflux disease without esophagitis 01/10/2016   Hyperlipidemia 01/10/2016   CKD (chronic kidney disease) 01/10/2016   Multiple sclerosis ([place]) 03/20/2015   Accelerated hypertension 03/20/2015   Cerebral infarction ([place]) 03/20/2015   Hemiplegia following CVA (cerebrovascular accident) ([place]) 03/20/2015    has No Known Allergies.  MEDICAL HISTORY: Past Medical History:  Diagnosis Date   Anemia    past hx many yrs ago    Arthritis    lt knee   Breast cancer ([place])    Breast cancer of upper-outer quadrant of left female breast ([place]) 04/02/2016   Chronic kidney disease    CKD   Colon polyps    COPD (chronic obstructive pulmonary disease) ([name])    GERD (gastroesophageal reflux disease)    years ago- not current    Hyperlipidemia    Hypertension    Multiple sclerosis ([place])    Personal history of chemotherapy    Personal history of radiation therapy    Stroke ([place])    2013, mild cognitive deficits   Vision abnormalities     SURGICAL HISTORY: Past Surgical History:  Procedure Laterality Date   BREAST BIOPSY Left 03/31/2016   BREAST BIOPSY Right 07/24/2021   BREAST LUMPECTOMY Left 04/17/2016   BREAST LUMPECTOMY WITH RADIOACTIVE SEED AND SENTINEL LYMPH NODE BIOPSY Right 09/02/2021   Procedure: RIGHT BREAST LUMPECTOMY WITH RADIOACTIVE SEED AND SENTINEL LYMPH NODE BIOPSY;  Surgeon: Erroll Luna, MD;  Location: [facility];  Service: General;  Laterality: Right;   COLONOSCOPY     HYSTEROSCOPY WITH D & C N/A 12/19/2015   Procedure: DILATATION AND CURETTAGE /HYSTEROSCOPY;  Surgeon: Linda Hedges, DO;  Location: [facility];  Service: Gynecology;  Laterality: N/A;   POLYPECTOMY     PORTACATH PLACEMENT Right 10/16/2021   Procedure:  INSERTION PORT-A-CATH;  Surgeon: Erroll Luna, MD;  Location: [facility];  Service: General;  Laterality: Right;   RADIOACTIVE SEED GUIDED PARTIAL MASTECTOMY WITH AXILLARY SENTINEL LYMPH NODE BIOPSY Left 04/17/2016   Procedure: RADIOACTIVE SEED GUIDED PARTIAL MASTECTOMY WITH AXILLARY SENTINEL LYMPH NODE BIOPSY;  Surgeon: Erroll Luna, MD;  Location: [facility];  Service: General;  Laterality: Left;  RADIOACTIVE SEED GUIDED PARTIAL MASTECTOMY WITH AXILLARY SENTINEL LYMPH NODE BIOPSY    TUBAL LIGATION     VAGINAL DELIVERY     x3    SOCIAL HISTORY: Social History   Socioeconomic History   Marital status: Married    Spouse name: Not on file   Number of children: 3   Years of education: Not on file   Highest education level: Not on file  Occupational History   Occupation: retired  Tobacco Use   Smoking status: Former    Packs/day: 0.50    Years: 25.00    Pack years: 12.50    Types: Cigarettes    Quit date: 11/16/2004    Years since quitting: 17.0   Smokeless tobacco: Never  Vaping Use  Vaping Use: Never used  Substance and Sexual Activity   Alcohol use: Yes    Alcohol/week: 0.0 standard drinks    Comment: occas   Drug use: No   Sexual activity: Yes    Comment: 1st intercourse 13 yo-5 partners  Other Topics Concern   Not on file  Social History Narrative   Diet- N/A   Caffeine- Yes   Married- Yes   House- 2 story with 2 people   Pets- No   Current/past profession- Insurance underwriter daycare   Exercise- Yes   Living will-No   DNR-N/A   POA/HPOA-No      Works part-time at [employer] Strain: Not on file  Food Insecurity: Not on file  Transportation Needs: Not on file  Physical Activity: Not on file  Stress: Not on file  Social Connections: Not on file  Intimate Partner Violence: Not on file    FAMILY HISTORY: Family History  Problem Relation Age of Onset   Stroke Sister     Hypertension Mother    Stroke Mother    Alcohol abuse Brother    HIV/AIDS Brother    Colon cancer Neg Hx    Colon polyps Neg Hx    Esophageal cancer Neg Hx    Rectal cancer Neg Hx    Stomach cancer Neg Hx     Review of Systems  Constitutional:  Positive for fatigue. Negative for appetite change, chills, fever and unexpected weight change.  HENT:   Negative for hearing loss, lump/mass and trouble swallowing.   Eyes:  Negative for eye problems and icterus.  Respiratory:  Negative for chest tightness, cough and shortness of breath.   Cardiovascular:  Negative for chest pain, leg swelling and palpitations.  Gastrointestinal:  Negative for abdominal distention, abdominal pain, constipation, diarrhea, nausea and vomiting.  Endocrine: Negative for hot flashes.  Genitourinary:  Negative for difficulty urinating.   Musculoskeletal:  Negative for arthralgias.  Skin:  Negative for itching and rash.  Neurological:  Negative for dizziness, extremity weakness, headaches and numbness.  Hematological:  Negative for adenopathy. Does not bruise/bleed easily.  Psychiatric/Behavioral:  Negative for depression. The patient is not nervous/anxious.      PHYSICAL EXAMINATION  ECOG PERFORMANCE STATUS: 1 - Symptomatic but completely ambulatory  Vitals:   11/28/21 1304  BP: (!) 184/80  Pulse: 68  Resp: 17  Temp: (!) 97.3 F (36.3 C)  SpO2: 97%    Physical Exam Constitutional:      General: She is not in acute distress.    Appearance: Normal appearance. She is not toxic-appearing.  HENT:     Head: Normocephalic and atraumatic.  Eyes:     General: No scleral icterus. Cardiovascular:     Rate and Rhythm: Normal rate and regular rhythm.     Pulses: Normal pulses.     Heart sounds: Normal heart sounds.  Pulmonary:     Effort: Pulmonary effort is normal.     Breath sounds: Normal breath sounds.  Abdominal:     General: Abdomen is flat. Bowel sounds are normal. There is no distension.      Palpations: Abdomen is soft.     Tenderness: There is no abdominal tenderness.  Musculoskeletal:        General: No swelling.     Cervical back: Neck supple.  Lymphadenopathy:     Cervical: No cervical adenopathy.  Skin:    General: Skin is warm and dry.  Findings: No rash.  Neurological:     General: No focal deficit present.     Mental Status: She is alert.  Psychiatric:        Mood and Affect: Mood normal.        Behavior: Behavior normal.    LABORATORY DATA:  CBC    Component Value Date/Time   WBC 3.9 (L) 11/28/2021 1210   WBC 3.9 04/18/2021 1348   RBC 3.57 (L) 11/28/2021 1210   HGB 11.1 (L) 11/28/2021 1210   HGB 12.6 04/08/2016 0904   HCT 32.7 (L) 11/28/2021 1210   HCT 38.0 04/08/2016 0904   PLT 300 11/28/2021 1210   PLT 271 04/08/2016 0904   MCV 91.6 11/28/2021 1210   MCV 93.6 04/08/2016 0904   MCH 31.1 11/28/2021 1210   MCHC 33.9 11/28/2021 1210   RDW 13.1 11/28/2021 1210   RDW 13.4 04/08/2016 0904   LYMPHSABS 1.4 11/28/2021 1210   LYMPHSABS 1.4 04/08/2016 0904   MONOABS 0.5 11/28/2021 1210   MONOABS 0.3 04/08/2016 0904   EOSABS 0.1 11/28/2021 1210   EOSABS 0.1 04/08/2016 0904   BASOSABS 0.0 11/28/2021 1210   BASOSABS 0.1 04/08/2016 0904    CMP     Component Value Date/Time   NA 140 11/28/2021 1210   NA 146 (H) 07/29/2020 1655   NA 141 04/08/2016 0904   K 3.9 11/28/2021 1210   K 4.7 04/08/2016 0904   CL 107 11/28/2021 1210   CO2 27 11/28/2021 1210   CO2 26 04/08/2016 0904   GLUCOSE 125 (H) 11/28/2021 1210   GLUCOSE 124 04/08/2016 0904   BUN 21 11/28/2021 1210   BUN 23 07/29/2020 1655   BUN 16.3 04/08/2016 0904   CREATININE 1.39 (H) 11/28/2021 1210   CREATININE 1.61 (H) 04/18/2021 1348   CREATININE 1.5 (H) 04/08/2016 0904   CALCIUM 9.4 11/28/2021 1210   CALCIUM 10.0 04/08/2016 0904   PROT 6.9 11/28/2021 1210   PROT 7.6 04/08/2016 0904   ALBUMIN 4.3 11/28/2021 1210   ALBUMIN 4.3 04/08/2016 0904   AST 25 11/28/2021 1210   AST 25  04/08/2016 0904   ALT 21 11/28/2021 1210   ALT 23 04/08/2016 0904   ALKPHOS 94 11/28/2021 1210   ALKPHOS 90 04/08/2016 0904   BILITOT 0.4 11/28/2021 1210   BILITOT 0.58 04/08/2016 0904   GFRNONAA 42 (L) 11/28/2021 1210   GFRNONAA 33 (L) 04/18/2021 1348   GFRAA 38 (L) 04/18/2021 1348   ASSESSMENT and THERAPY PLAN:   Breast cancer of upper-outer quadrant of left female breast ([place]) 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, 1.3 cm, with DCIS, margins negative, 0/4 lymph nodes negative, T1 cN0 stage IA pathologic stage, ER 100%, PR 100%, HER-2 negative ratio 1.39, Ki-67 20% (Originally 2 nodules were detected on screening mammogram 1.9 cm= fat necrosis and 8 mm IDC) Oncotype DX score 20, 13% risk of recurrence, intermediate risk Adjuvant radiation therapy started 07/20/2017completed 07/13/2016     09/02/2021:Right lumpectomy: Grade 2 ILC 3.1 cm, margins negative, LVI present, 4/6 lymph nodes macrometastases, 2 lymph nodes isolated tumor cells, ER 100%, PR 90%, Ki-67 25%, HER2 negative   Treatment plan: 1.  Based on for enlarged lymph nodes, recommended systemic chemotherapy with CMF x6 cycles 2. adjuvant radiation therapy 3.  For the continued antiestrogen therapy (previously anastrozole started 07/23/2016 switched to letrozole 2018 switched to tamoxifen 2018, switched back to anastrozole) hair loss was the main problem, may consider exemestane ------------------------------------------------------------------------------------------------------------------------------- 10/15/2021: CT CAP: No metastatic disease.  Descending and  sigmoid colon diverticulosis, several tiny pulmonary nodules less than 5 mm, 2.1 cm left thyroid nodule   Current treatment: Cycle 3 CMF Chemo Toxicities: Mild fatigue.  Tolerating well otherwise.   Return to clinic in 3 weeks for toxicity check  No orders of the defined types were placed in this encounter.   All questions were answered. The patient knows to call  the clinic with any problems, questions or concerns. We can certainly see the patient much sooner if necessary.  Total encounter time: 20 minutes in face to face visit time, chart review, lab review, care coordination, and documentation of the encounter.    Wilber Bihari, NP 11/29/21 11:17 AM Medical Oncology and Hematology [facility] [place], [place] [address] Tel. [phone]    Fax. [phone]  *Total Encounter Time as defined by the Centers for Medicare and Medicaid Services includes, in addition to the face-to-face time of a patient visit (documented in the note above) non-face-to-face time: obtaining and reviewing outside history, ordering and reviewing medications, tests or procedures, care coordination (communications with other health care professionals or caregivers) and documentation in the medical record.

## 2021-11-29 ENCOUNTER — Encounter: Payer: Self-pay | Admitting: Hematology and Oncology

## 2021-12-17 MED FILL — Dexamethasone Sodium Phosphate Inj 100 MG/10ML: INTRAMUSCULAR | Qty: 1 | Status: AC

## 2021-12-17 NOTE — Progress Notes (Signed)
Patient Care Team: [name], NP as PCP - General (Geriatric Medicine) Lorretta Harp, MD as PCP - Cardiology (Cardiology) Nicholas Lose, MD as Consulting Physician (Oncology) Erroll Luna, MD as Consulting Physician (General Surgery)  DIAGNOSIS:    ICD-10-CM   1. Malignant neoplasm of upper-outer quadrant of right breast in female, estrogen receptor positive ([place])  C50.411    Z17.0       SUMMARY OF ONCOLOGIC HISTORY: Oncology History  Breast cancer of upper-outer quadrant of left female breast ([place])  03/31/2016 Initial Diagnosis   Screening detected left breast mass, 2 nodules, 1.9 x 1.6 x 0.8 cm= fat necrosis; 8 x 7 x 7 mm= grade 2 IDC ER 100%, PR 100%, HER-2 negative ratio 1.39, Ki-67 20%   04/17/2016 Surgery   Left lumpectomy ([name]): IDC grade 2, 1.3 cm, with DCIS, margins negative, 0/4 lymph nodes negative, T1 cN0 stage IA pathologic stage, ER 100%, PR 100%, HER-2 negative ratio 1.39, Ki-67 20% Oncotype DX score 20, 13% ROR, intermediate risk   04/17/2016 Oncotype testing   Recurrence score: 20; ROR 15% (intermediate risk)    05/27/2016 - 07/13/2016 Radiation Therapy   Adjuvant radiation therapy [facility]): Left breast treated with breath hold to 50.4 Gy in 28 fractions at 1.8 Gy/fraction.  Left breast boosted to 10 Gy in 5 fractions at 2 Gy/fraction   07/23/2016 -  Anti-estrogen oral therapy   Anastrozole 1 mg switched to letrozole 04/22/2017 due to hair loss, switch to tamoxifen 10/22/2017 due to hair loss from letrozole as well   Malignant neoplasm of upper-outer quadrant of right breast in female, estrogen receptor positive ([place])  08/04/2021 Initial Diagnosis   Malignant neoplasm of upper-outer quadrant of right breast in female, estrogen receptor positive ([name])    Relapse/Recurrence   2.2 cm ILC ER 100%, PR 90%, Her 2 Neg, Ki 67: 25%, Size 2.5 cm   08/04/2021 Cancer Staging   Staging form: Breast, AJCC 8th Edition - Clinical: Stage IB (cT2, cN0, cM0, G2, ER+,  PR+, HER2-) - Signed by Nicholas Lose, MD on 08/04/2021 Stage prefix: Initial diagnosis Histologic grading system: 3 grade system    09/02/2021 Surgery   Right lumpectomy: Grade 2 ILC 3.1 cm, margins negative, LVI present, 4/6 lymph nodes macrometastases, 2 lymph nodes isolated tumor cells, ER 100%, PR 90%, Ki-67 25%, HER2 negative   10/17/2021 -  Chemotherapy   Patient is on Treatment Plan : BREAST Adjuvant CMF IV q21d       CHIEF COMPLIANT: CMF Cycle 4  INTERVAL HISTORY: Michele Page is a 66 y.o. with above-mentioned history of  left breast cancer, currently on adjuvant chemotherapy with CMF. She presents to the clinic today for treatment.  She is tolerating CMF chemotherapy extremely well without any problems or concerns.  She does not have any nausea vomiting.  Denies any fatigue or dizziness or lightheadedness.  Her major complaint is that she has been gaining weight.  ALLERGIES:  has No Known Allergies.  MEDICATIONS:  Current Outpatient Medications  Medication Sig Dispense Refill   aspirin EC 81 MG tablet Take 1 tablet (81 mg total) by mouth daily. 30 tablet 6   atorvastatin (LIPITOR) 20 MG tablet TAKE 1 TABLET EVERY DAY 90 tablet 1   eszopiclone (LUNESTA) 1 MG TABS tablet Take one tablet by mouth at bedtime as needed for sleep (take immediately before bedtime) 30 tablet 0   labetalol (NORMODYNE) 300 MG tablet TAKE 2 TABLETS THREE TIMES DAILY 540 tablet 1  NIFEdipine (PROCARDIA XL/NIFEDICAL XL) 60 MG 24 hr tablet Take 1 tablet (60 mg total) by mouth 2 (two) times daily. 180 tablet 3   ondansetron (ZOFRAN) 8 MG tablet Take 1 tablet (8 mg total) by mouth 2 (two) times daily as needed for refractory nausea / vomiting. Start on day 3 after chemotherapy. 30 tablet 1   No current facility-administered medications for this visit.    PHYSICAL EXAMINATION: ECOG PERFORMANCE STATUS: 1 - Symptomatic but completely ambulatory  Vitals:   12/18/21 1354  BP: 138/71  Pulse: 75  Temp: 97.7  F (36.5 C)  SpO2: 100%   Filed Weights   12/18/21 1354  Weight: 208 lb 9.6 oz (94.6 kg)    LABORATORY DATA:  I have reviewed the data as listed CMP Latest Ref Rng & Units 12/18/2021 11/28/2021 11/06/2021  Glucose 70 - 99 mg/dL 140(H) 125(H) 93  BUN 8 - 23 mg/dL _0 Creatinine 0.44 - 1.00 mg/dL 1.24(H) 1.39(H) 1.34(H)  Sodium 135 - 145 mmol/L 140 140 141  Potassium 3.5 - 5.1 mmol/L 4.0 3.9 4.1  Chloride 98 - 111 mmol/L 107 107 108  CO2 22 - 32 mmol/L _1 Calcium 8.9 - 10.3 mg/dL 9.4 9.4 9.5  Total Protein 6.5 - 8.1 g/dL 6.8 6.9 7.0  Total Bilirubin 0.3 - 1.2 mg/dL 0.3 0.4 0.4  Alkaline Phos 38 - 126 U/L 99 94 102  AST 15 - 41 U/L _2 ALT 0 - 44 U/L _3 Lab Results  Component Value Date   WBC 3.1 (L) 12/18/2021   HGB 11.0 (L) 12/18/2021   HCT 32.6 (L) 12/18/2021   MCV 92.9 12/18/2021   PLT 309 12/18/2021   NEUTROABS 1.1 (L) 12/18/2021    ASSESSMENT & PLAN:  Malignant neoplasm of upper-outer quadrant of right breast in female, estrogen receptor positive ([place]) 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, 1.3 cm, with DCIS, margins negative, 0/4 lymph nodes negative, T1 cN0 stage IA pathologic stage, ER 100%, PR 100%, HER-2 negative ratio 1.39, Ki-67 20% (Originally 2 nodules were detected on screening mammogram 1.9 cm= fat necrosis and 8 mm IDC) Oncotype DX score 20, 13% risk of recurrence, intermediate risk Adjuvant radiation therapy started 07/20/2017completed 07/13/2016     09/02/2021:Right lumpectomy: Grade 2 ILC 3.1 cm, margins negative, LVI present, 4/6 lymph nodes macrometastases, 2 lymph nodes isolated tumor cells, ER 100%, PR 90%, Ki-67 25%, HER2 negative   Treatment plan: 1.  Based on for enlarged lymph nodes, recommended systemic chemotherapy with CMF x6 cycles 2. adjuvant radiation therapy 3.  For the continued antiestrogen therapy (previously anastrozole started 07/23/2016 switched to letrozole 2018 switched to tamoxifen 2018, switched back  to anastrozole) hair loss was the main problem, may consider exemestane ------------------------------------------------------------------------------------------------------------------------------- 10/15/2021: CT CAP: No metastatic disease.  Descending and sigmoid colon diverticulosis, several tiny pulmonary nodules less than 5 mm, 2.1 cm left thyroid nodule   Current treatment: Cycle 4 CMF Chemo Toxicities: Tolerating chemotherapy extremely well without any side effects. Leukopenia: Reduced the dosage of chemotherapy for cycle 2. Leukopenia: ANC 1.1 today.  Proceed with today's treatment. I discussed with the patient that if on the next treatment day her counts are below Scott of 1, then we may have to hold her treatment. Otherwise patient denies any nausea or vomiting. Weight gain issues: Instructed her to eat less carbs and increase her activity level. If the white count continues to decline then we may have to  give her Neulasta injection.   Return to clinic in 3 week for toxicity check    No orders of the defined types were placed in this encounter.  The patient has a good understanding of the overall plan. she agrees with it. she will call with any problems that may develop before the next visit here.  Total time spent: 30 mins including face to face time and time spent for planning, charting and coordination of care  Rulon Eisenmenger, MD, MPH 12/18/2021  I, Thana Ates, am acting as scribe for Dr. Nicholas Lose.  I have reviewed the above documentation for accuracy and completeness, and I agree with the above.

## 2021-12-18 ENCOUNTER — Other Ambulatory Visit: Payer: Self-pay

## 2021-12-18 ENCOUNTER — Inpatient Hospital Stay: Payer: Medicare HMO

## 2021-12-18 ENCOUNTER — Inpatient Hospital Stay (HOSPITAL_BASED_OUTPATIENT_CLINIC_OR_DEPARTMENT_OTHER): Payer: Medicare HMO | Admitting: Hematology and Oncology

## 2021-12-18 ENCOUNTER — Inpatient Hospital Stay: Payer: Medicare HMO | Attending: Hematology and Oncology

## 2021-12-18 DIAGNOSIS — Z79811 Long term (current) use of aromatase inhibitors: Secondary | ICD-10-CM | POA: Insufficient documentation

## 2021-12-18 DIAGNOSIS — Z5111 Encounter for antineoplastic chemotherapy: Secondary | ICD-10-CM | POA: Insufficient documentation

## 2021-12-18 DIAGNOSIS — C50411 Malignant neoplasm of upper-outer quadrant of right female breast: Secondary | ICD-10-CM

## 2021-12-18 DIAGNOSIS — Z170 Estrogen receptor positive status [ER+]: Secondary | ICD-10-CM

## 2021-12-18 DIAGNOSIS — Z79899 Other long term (current) drug therapy: Secondary | ICD-10-CM | POA: Insufficient documentation

## 2021-12-18 DIAGNOSIS — Z95828 Presence of other vascular implants and grafts: Secondary | ICD-10-CM

## 2021-12-18 LAB — CMP (CANCER CENTER ONLY)
ALT: 23 U/L (ref 0–44)
AST: 24 U/L (ref 15–41)
Albumin: 4.3 g/dL (ref 3.5–5.0)
Alkaline Phosphatase: 99 U/L (ref 38–126)
Anion gap: 5 (ref 5–15)
BUN: 19 mg/dL (ref 8–23)
CO2: 28 mmol/L (ref 22–32)
Calcium: 9.4 mg/dL (ref 8.9–10.3)
Chloride: 107 mmol/L (ref 98–111)
Creatinine: 1.24 mg/dL — ABNORMAL HIGH (ref 0.44–1.00)
GFR, Estimated: 48 mL/min — ABNORMAL LOW (ref >60)
Glucose, Bld: 140 mg/dL — ABNORMAL HIGH (ref 70–99)
Potassium: 4 mmol/L (ref 3.5–5.1)
Sodium: 140 mmol/L (ref 135–145)
Total Bilirubin: 0.3 mg/dL (ref 0.3–1.2)
Total Protein: 6.8 g/dL (ref 6.5–8.1)

## 2021-12-18 LAB — CBC WITH DIFFERENTIAL (CANCER CENTER ONLY)
Abs Immature Granulocytes: 0.02 10*3/uL (ref 0.00–0.07)
Basophils Absolute: 0.1 10*3/uL (ref 0.0–0.1)
Basophils Relative: 2 %
Eosinophils Absolute: 0.3 10*3/uL (ref 0.0–0.5)
Eosinophils Relative: 8 %
HCT: 32.6 % — ABNORMAL LOW (ref 36.0–46.0)
Hemoglobin: 11 g/dL — ABNORMAL LOW (ref 12.0–15.0)
Immature Granulocytes: 1 %
Lymphocytes Relative: 36 %
Lymphs Abs: 1.1 10*3/uL (ref 0.7–4.0)
MCH: 31.3 pg (ref 26.0–34.0)
MCHC: 33.7 g/dL (ref 30.0–36.0)
MCV: 92.9 fL (ref 80.0–100.0)
Monocytes Absolute: 0.5 10*3/uL (ref 0.1–1.0)
Monocytes Relative: 17 %
Neutro Abs: 1.1 10*3/uL — ABNORMAL LOW (ref 1.7–7.7)
Neutrophils Relative %: 36 %
Platelet Count: 309 10*3/uL (ref 150–400)
RBC: 3.51 MIL/uL — ABNORMAL LOW (ref 3.87–5.11)
RDW: 13.3 % (ref 11.5–15.5)
WBC Count: 3.1 10*3/uL — ABNORMAL LOW (ref 4.0–10.5)
nRBC: 0 % (ref 0.0–0.2)

## 2021-12-18 MED ORDER — SODIUM CHLORIDE 0.9% FLUSH
10.0000 mL | Freq: Once | INTRAVENOUS | Status: AC
  Administered 2021-12-18: 10 mL

## 2021-12-18 MED ORDER — PALONOSETRON HCL INJECTION 0.25 MG/5ML
0.2500 mg | Freq: Once | INTRAVENOUS | Status: AC
  Administered 2021-12-18: 0.25 mg via INTRAVENOUS
  Filled 2021-12-18: qty 5

## 2021-12-18 MED ORDER — SODIUM CHLORIDE 0.9 % IV SOLN
10.0000 mg | Freq: Once | INTRAVENOUS | Status: AC
  Administered 2021-12-18: 10 mg via INTRAVENOUS
  Filled 2021-12-18: qty 10

## 2021-12-18 MED ORDER — FLUOROURACIL CHEMO INJECTION 2.5 GM/50ML
400.0000 mg/m2 | Freq: Once | INTRAVENOUS | Status: AC
  Administered 2021-12-18: 800 mg via INTRAVENOUS
  Filled 2021-12-18: qty 16

## 2021-12-18 MED ORDER — SODIUM CHLORIDE 0.9% FLUSH
10.0000 mL | INTRAVENOUS | Status: DC | PRN
  Administered 2021-12-18: 10 mL

## 2021-12-18 MED ORDER — SODIUM CHLORIDE 0.9 % IV SOLN
500.0000 mg/m2 | Freq: Once | INTRAVENOUS | Status: AC
  Administered 2021-12-18: 1000 mg via INTRAVENOUS
  Filled 2021-12-18: qty 50

## 2021-12-18 MED ORDER — HEPARIN SOD (PORK) LOCK FLUSH 100 UNIT/ML IV SOLN
500.0000 [IU] | Freq: Once | INTRAVENOUS | Status: AC | PRN
  Administered 2021-12-18: 500 [IU]

## 2021-12-18 MED ORDER — METHOTREXATE SODIUM (PF) CHEMO INJECTION 250 MG/10ML
30.0000 mg/m2 | Freq: Once | INTRAMUSCULAR | Status: AC
  Administered 2021-12-18: 60 mg via INTRAVENOUS
  Filled 2021-12-18: qty 2.4

## 2021-12-18 MED ORDER — SODIUM CHLORIDE 0.9 % IV SOLN: Freq: Once | INTRAVENOUS | Status: AC

## 2021-12-18 NOTE — Progress Notes (Signed)
Per Dr. Lindi Adie, ok for treatment today with ANC 1.1

## 2021-12-18 NOTE — Assessment & Plan Note (Signed)
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, 1.3 cm, with DCIS, margins negative, 0/4 lymph nodes negative, T1 cN0 stage IA pathologic stage, ER 100%, PR 100%, HER-2 negative ratio 1.39, Ki-67 20% (Originally 2 nodules were detected on screening mammogram 1.9 cm= fat necrosis and 8 mm IDC) Oncotype DX score 20, 13% risk of recurrence, intermediate risk Adjuvant radiation therapy started 07/20/2017completed 07/13/2016   09/02/2021:Right lumpectomy: Grade 2 ILC 3.1 cm, margins negative, LVI present, 4/6 lymph nodes macrometastases, 2 lymph nodes isolated tumor cells, ER 100%, PR 90%, Ki-67 25%, HER2 negative  Treatment plan: 1.Based on for enlarged lymph nodes, recommended systemic chemotherapy with CMF x8 cycles 2.adjuvant radiation therapy 3.For the continued antiestrogen therapy (previously anastrozole started 07/23/2016 switched to letrozole 2018 switched to tamoxifen 2018, switched back to anastrozole) hair loss was the main problem, may consider exemestane ------------------------------------------------------------------------------------------------------------------------------- 10/15/2021: CT CAP: No metastatic disease. Descending and sigmoid colon diverticulosis, several tiny pulmonary nodules less than 5 mm, 2.1 cm left thyroid nodule  Current treatment: Cycle 3 CMF Chemo Toxicities: Tolerating chemotherapy extremely well without any side effects. Leukopenia: Reduce the dosage of chemotherapy for cycle 2. If the white count continues to decline then we may have to give her Neulasta injection.  Return to clinic in 3 week for toxicity check

## 2021-12-18 NOTE — Patient Instructions (Signed)
[facility]COLOG[facility]  Discharge Instructions: Thank you for choosing [facility] to provide your oncology and hematology care.   If you have a lab appointment with the [facility], please go directly to the [facility] and check in at the registration area.   Wear comfortable clothing and clothing appropriate for easy access to any Portacath or PICC line.   We strive to give you quality time with your provider. You may need to reschedule your appointment if you arrive late (15 or more minutes).  Arriving late affects you and other patients whose appointments are after yours.  Also, if you miss three or more appointments without notifying the office, you may be dismissed from the clinic at the providers discretion.      For prescription refill requests, have your pharmacy contact our office and allow 72 hours for refills to be completed.    Today you received the following chemotherapy and/or immunotherapy agents: Cyclophosphamide (Cytoxan), Methotrexate, and Fluorouracil.   To help prevent nausea and vomiting after your treatment, we encourage you to take your nausea medication as directed.  BELOW ARE SYMPTOMS THAT SHOULD BE REPORTED IMMEDIATELY: *FEVER GREATER THAN 100.4 F (38 C) OR HIGHER *CHILLS OR SWEATING *NAUSEA AND VOMITING THAT IS NOT CONTROLLED WITH YOUR NAUSEA MEDICATION *UNUSUAL SHORTNESS OF BREATH *UNUSUAL BRUISING OR BLEEDING *URINARY PROBLEMS (pain or burning when urinating, or frequent urination) *BOWEL PROBLEMS (unusual diarrhea, constipation, pain near the anus) TENDERNESS IN MOUTH AND THROAT WITH OR WITHOUT PRESENCE OF ULCERS (sore throat, sores in mouth, or a toothache) UNUSUAL RASH, SWELLING OR PAIN  UNUSUAL VAGINAL DISCHARGE OR ITCHING   Items with * indicate a potential emergency and should be followed up as soon as possible or go to the Emergency Department if any problems should occur.  Please show the CHEMOTHERAPY ALERT  CARD or IMMUNOTHERAPY ALERT CARD at check-in to the Emergency Department and triage nurse.  Should you have questions after your visit or need to cancel or reschedule your appointment, please contact [facility]  Dept: [phone]  and follow the prompts.  Office hours are 8:00 a.m. to 4:30 p.m. Monday - Friday. Please note that voicemails left after 4:00 p.m. may not be returned until the following business day.  We are closed weekends and major holidays. You have access to a nurse at all times for urgent questions. Please call the main number to the clinic Dept: [phone] and follow the prompts.   For any non-urgent questions, you may also contact your provider using MyChart. We now offer e-Visits for anyone 62 and older to request care online for non-urgent symptoms. For details visit [email or web address].   Also download the MyChart app! Go to the app store, search "MyChart", open the app, select [facility], and log in with your MyChart username and password.  Due to Covid, a mask is required upon entering the hospital/clinic. If you do not have a mask, one will be given to you upon arrival. For doctor visits, patients may have 1 support person aged 94 or older with them. For treatment visits, patients cannot have anyone with them due to current Covid guidelines and our immunocompromised population.

## 2021-12-19 ENCOUNTER — Ambulatory Visit: Payer: Medicare HMO

## 2021-12-19 ENCOUNTER — Ambulatory Visit: Payer: Medicare HMO | Admitting: Hematology and Oncology

## 2021-12-19 ENCOUNTER — Other Ambulatory Visit: Payer: Medicare HMO

## 2021-12-29 ENCOUNTER — Telehealth: Payer: Self-pay | Admitting: *Deleted

## 2021-12-29 DIAGNOSIS — F5101 Primary insomnia: Secondary | ICD-10-CM

## 2021-12-29 NOTE — Telephone Encounter (Signed)
Patient requested refill.  Stated that she is not getting any relief with the Lunesta 1mg  and requesting it to be increased.   Please Advise.  Epic LR: 10/22/2021 Contract Date: 04/18/2021

## 2021-12-30 MED ORDER — ESZOPICLONE 2 MG PO TABS: ORAL_TABLET | ORAL | 0 refills | Status: DC

## 2021-12-30 NOTE — Telephone Encounter (Signed)
Order sent for lunesta 2 mg at bedtime

## 2022-01-07 ENCOUNTER — Other Ambulatory Visit: Payer: Self-pay

## 2022-01-07 ENCOUNTER — Ambulatory Visit: Payer: Medicare HMO | Admitting: Cardiovascular Disease

## 2022-01-07 ENCOUNTER — Encounter: Payer: Self-pay | Admitting: Cardiovascular Disease

## 2022-01-07 ENCOUNTER — Telehealth: Payer: Self-pay | Admitting: Cardiovascular Disease

## 2022-01-07 DIAGNOSIS — E782 Mixed hyperlipidemia: Secondary | ICD-10-CM | POA: Diagnosis not present

## 2022-01-07 DIAGNOSIS — I10 Essential (primary) hypertension: Secondary | ICD-10-CM | POA: Diagnosis not present

## 2022-01-07 MED FILL — Dexamethasone Sodium Phosphate Inj 100 MG/10ML: INTRAMUSCULAR | Qty: 1 | Status: AC

## 2022-01-07 NOTE — Assessment & Plan Note (Signed)
History of hyperlipidemia on statin therapy with lipid profile performed 04/18/2021 revealing total cholesterol 139, LDL 58 HDL 66

## 2022-01-07 NOTE — Progress Notes (Signed)
Patient Care Team: [name], NP as PCP - General (Geriatric Medicine) Lorretta Harp, MD as PCP - Cardiology (Cardiology) Nicholas Lose, MD as Consulting Physician (Oncology) Erroll Luna, MD as Consulting Physician (General Surgery)  DIAGNOSIS:    ICD-10-CM   1. Malignant neoplasm of upper-outer quadrant of right breast in female, estrogen receptor positive ([facility])  C50.411    Z17.0       SUMMARY OF ONCOLOGIC HISTORY: Oncology History  Breast cancer of upper-outer quadrant of left female breast ([facility])  03/31/2016 Initial Diagnosis   Screening detected left breast mass, 2 nodules, 1.9 x 1.6 x 0.8 cm= fat necrosis; 8 x 7 x 7 mm= grade 2 IDC ER 100%, PR 100%, HER-2 negative ratio 1.39, Ki-67 20%   04/17/2016 Surgery   Left lumpectomy ([name]): IDC grade 2, 1.3 cm, with DCIS, margins negative, 0/4 lymph nodes negative, T1 cN0 stage IA pathologic stage, ER 100%, PR 100%, HER-2 negative ratio 1.39, Ki-67 20% Oncotype DX score 20, 13% ROR, intermediate risk   04/17/2016 Oncotype testing   Recurrence score: 20; ROR 15% (intermediate risk)    05/27/2016 - 07/13/2016 Radiation Therapy   Adjuvant radiation therapy [facility]): Left breast treated with breath hold to 50.4 Gy in 28 fractions at 1.8 Gy/fraction.  Left breast boosted to 10 Gy in 5 fractions at 2 Gy/fraction   07/23/2016 -  Anti-estrogen oral therapy   Anastrozole 1 mg switched to letrozole 04/22/2017 due to hair loss, switch to tamoxifen 10/22/2017 due to hair loss from letrozole as well   Malignant neoplasm of upper-outer quadrant of right breast in female, estrogen receptor positive ([facility])  08/04/2021 Initial Diagnosis   Malignant neoplasm of upper-outer quadrant of right breast in female, estrogen receptor positive ([name])    Relapse/Recurrence   2.2 cm ILC ER 100%, PR 90%, Her 2 Neg, Ki 67: 25%, Size 2.5 cm   08/04/2021 Cancer Staging   Staging form: Breast, AJCC 8th Edition - Clinical: Stage IB (cT2, cN0, cM0, G2, ER+,  PR+, HER2-) - Signed by Nicholas Lose, MD on 08/04/2021 Stage prefix: Initial diagnosis Histologic grading system: 3 grade system    09/02/2021 Surgery   Right lumpectomy: Grade 2 ILC 3.1 cm, margins negative, LVI present, 4/6 lymph nodes macrometastases, 2 lymph nodes isolated tumor cells, ER 100%, PR 90%, Ki-67 25%, HER2 negative   10/17/2021 -  Chemotherapy   Patient is on Treatment Plan : BREAST Adjuvant CMF IV q21d       CHIEF COMPLIANT: CMF Cycle 5  INTERVAL HISTORY: Michele Page is a 66 y.o. with above-mentioned history of breast cancer, currently on adjuvant chemotherapy with CMF. She presents to the clinic today for treatment.  Does not have any adverse effects to CMF chemotherapy.  She feels well denies any nausea or vomiting.  ALLERGIES:  has No Known Allergies.  MEDICATIONS:  Current Outpatient Medications  Medication Sig Dispense Refill   aspirin EC 81 MG tablet Take 1 tablet (81 mg total) by mouth daily. 30 tablet 6   atorvastatin (LIPITOR) 20 MG tablet TAKE 1 TABLET EVERY DAY 90 tablet 1   eszopiclone (LUNESTA) 2 MG TABS tablet Take one tablet by mouth at bedtime as needed for sleep (take immediately before bedtime) 30 tablet 0   labetalol (NORMODYNE) 300 MG tablet TAKE 2 TABLETS THREE TIMES DAILY 540 tablet 1   NIFEdipine (PROCARDIA XL/NIFEDICAL XL) 60 MG 24 hr tablet Take 1 tablet (60 mg total) by mouth 2 (two) times daily. 180 tablet  3   No current facility-administered medications for this visit.    PHYSICAL EXAMINATION: ECOG PERFORMANCE STATUS: 1 - Symptomatic but completely ambulatory  Vitals:   01/08/22 1342  BP: (!) 113/59  Pulse: 81  Resp: 18  Temp: 97.7 F (36.5 C)  SpO2: 99%   Filed Weights   01/08/22 1342  Weight: 207 lb (93.9 kg)    LABORATORY DATA:  I have reviewed the data as listed CMP Latest Ref Rng & Units 01/08/2022 12/18/2021 11/28/2021  Glucose 70 - 99 mg/dL 162(H) 140(H) 125(H)  BUN 8 - 23 mg/dL 23 19 21   Creatinine 0.44 - 1.00 mg/dL  1.48(H) 1.24(H) 1.39(H)  Sodium 135 - 145 mmol/L 141 140 140  Potassium 3.5 - 5.1 mmol/L 3.6 4.0 3.9  Chloride 98 - 111 mmol/L 107 107 107  CO2 22 - 32 mmol/L 29 28 27   Calcium 8.9 - 10.3 mg/dL 9.4 9.4 9.4  Total Protein 6.5 - 8.1 g/dL 6.7 6.8 6.9  Total Bilirubin 0.3 - 1.2 mg/dL 0.5 0.3 0.4  Alkaline Phos 38 - 126 U/L 90 99 94  AST 15 - 41 U/L 26 24 25   ALT 0 - 44 U/L 25 23 21     Lab Results  Component Value Date   WBC 3.0 (L) 01/08/2022   HGB 10.9 (L) 01/08/2022   HCT 32.5 (L) 01/08/2022   MCV 92.9 01/08/2022   PLT 315 01/08/2022   NEUTROABS 0.9 (L) 01/08/2022    ASSESSMENT & PLAN:  Malignant neoplasm of upper-outer quadrant of right breast in female, estrogen receptor positive ([place]) 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, 1.3 cm, with DCIS, margins negative, 0/4 lymph nodes negative, T1 cN0 stage IA pathologic stage, ER 100%, PR 100%, HER-2 negative ratio 1.39, Ki-67 20% (Originally 2 nodules were detected on screening mammogram 1.9 cm= fat necrosis and 8 mm IDC) Oncotype DX score 20, 13% risk of recurrence, intermediate risk Adjuvant radiation therapy started 07/20/2017completed 07/13/2016     09/02/2021:Right lumpectomy: Grade 2 ILC 3.1 cm, margins negative, LVI present, 4/6 lymph nodes macrometastases, 2 lymph nodes isolated tumor cells, ER 100%, PR 90%, Ki-67 25%, HER2 negative   Treatment plan: 1.  Based on for enlarged lymph nodes, recommended systemic chemotherapy with CMF x6 cycles 2. adjuvant radiation therapy 3.  For the continued antiestrogen therapy (previously anastrozole started 07/23/2016 switched to letrozole 2018 switched to tamoxifen 2018, switched back to anastrozole) hair loss was the main problem, may consider exemestane ------------------------------------------------------------------------------------------------------------------------------- 10/15/2021: CT CAP: No metastatic disease.  Descending and sigmoid colon diverticulosis, several tiny pulmonary  nodules less than 5 mm, 2.1 cm left thyroid nodule   Current treatment: Cycle 5 CMF  Chemo Toxicities: Tolerating chemotherapy extremely well without any side effects. Leukopenia: Reduced the dosage of chemotherapy for cycle 2. Leukopenia: ANC 0.9 today.  Proceed with today's treatment.  RTC with cycle 6    No orders of the defined types were placed in this encounter.  The patient has a good understanding of the overall plan. she agrees with it. she will call with any problems that may develop before the next visit here.  Total time spent: 30 mins including face to face time and time spent for planning, charting and coordination of care  Rulon Eisenmenger, MD, MPH 01/08/2022  I, Thana Ates, am acting as scribe for Dr. Nicholas Lose.  I have reviewed the above documentation for accuracy and completeness, and I agree with the above.

## 2022-01-07 NOTE — Telephone Encounter (Signed)
Returned call to pt she states that PCP referred her to Dr Gwenlyn Found to discuss this left hand pain. She states that it is on the radial side of the left hand just below her thumb. She states that she feels numbness and pain all day but hurts the most when "you push on it" informed pt do not do this. Pt is already a pt here with Dr Gwenlyn Found. Informed pt that this is probably not cardiac related but I'm sure that Dr Gwenlyn Found will look at it and tell her what he thinks but JB does LE issues. Pt verbalized understanding. She will arrive early for check in.

## 2022-01-07 NOTE — Assessment & Plan Note (Signed)
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, 1.3 cm, with DCIS, margins negative, 0/4 lymph nodes negative, T1 cN0 stage IA pathologic stage, ER 100%, PR 100%, HER-2 negative ratio 1.39, Ki-67 20% (Originally 2 nodules were detected on screening mammogram 1.9 cm= fat necrosis and 8 mm IDC) Oncotype DX score 20, 13% risk of recurrence, intermediate risk Adjuvant radiation therapy started 07/20/2017completed 07/13/2016   09/02/2021:Right lumpectomy: Grade 2 ILC 3.1 cm, margins negative, LVI present, 4/6 lymph nodes macrometastases, 2 lymph nodes isolated tumor cells, ER 100%, PR 90%, Ki-67 25%, HER2 negative  Treatment plan: 1.Based on for enlarged lymph nodes, recommended systemic chemotherapy with CMF x6 cycles 2.adjuvant radiation therapy 3.For the continued antiestrogen therapy (previously anastrozole started 07/23/2016 switched to letrozole 2018 switched to tamoxifen 2018, switched back to anastrozole) hair loss was the main problem, may consider exemestane ------------------------------------------------------------------------------------------------------------------------------- 10/15/2021: CT CAP: No metastatic disease. Descending and sigmoid colon diverticulosis, several tiny pulmonary nodules less than 5 mm, 2.1 cm left thyroid nodule  Current treatment: Cycle5CMF  Chemo Toxicities:Tolerating chemotherapy extremely well without any side effects. Leukopenia: Reduced the dosage of chemotherapy for cycle 2. Leukopenia: ANC 1.1 today.  Proceed with today's treatment.  RTC with cycle 6

## 2022-01-07 NOTE — Assessment & Plan Note (Signed)
History of hypertension a blood pressure measured today at 116/68.  She is on labetalol and nifedipine.

## 2022-01-07 NOTE — Progress Notes (Signed)
01/07/2022 ANNY SAYLER   1956-05-01  [id number]  Primary Physician [name], NP Primary Cardiologist: Lorretta Harp MD Lupe Carney, [place]  HPI:  Michele Page is a 66 y.o.  moderately overweight married African-American female mother of 3 children, grandmother of 28 grandchildren who relocated from [place] to [place] 5 years ago. She is retired from working in her own daycare. I last saw her in the office 10/29/2020.  Is a history of hypertension and hypokalemia. She's had a stroke 4 years ago leaving her with some motor and cognitive deficit deficits. She's never had a heart attack. She denies chest pain or shortness of breath. She was referred here for further management of her hypertension. Since I saw her a year ago her blood pressure has remained well controlled on her current medical regimen.   I obtained renal Doppler studies 08/17/2020 that showed no evidence of renal artery stenosis.  I referred her to our Pharm.D. who has been working with her antihypertensive medications.    Since I saw her over a year ago she continues to do well.  Her blood pressures are under much better control.  Her lipid profile is excellent.  She denies chest pain or shortness of breath.  Her major complaint is pain in her left wrist.  Current Meds  Medication Sig   aspirin EC 81 MG tablet Take 1 tablet (81 mg total) by mouth daily.   atorvastatin (LIPITOR) 20 MG tablet TAKE 1 TABLET EVERY DAY   eszopiclone (LUNESTA) 2 MG TABS tablet Take one tablet by mouth at bedtime as needed for sleep (take immediately before bedtime)   labetalol (NORMODYNE) 300 MG tablet TAKE 2 TABLETS THREE TIMES DAILY   NIFEdipine (PROCARDIA XL/NIFEDICAL XL) 60 MG 24 hr tablet Take 1 tablet (60 mg total) by mouth 2 (two) times daily.     No Known Allergies  Social History   Socioeconomic History   Marital status: Married    Spouse name: Not on file   Number of children: 3   Years of education:  Not on file   Highest education level: Not on file  Occupational History   Occupation: retired  Tobacco Use   Smoking status: Former    Packs/day: 0.50    Years: 25.00    Pack years: 12.50    Types: Cigarettes    Quit date: 11/16/2004    Years since quitting: 17.1   Smokeless tobacco: Never  Vaping Use   Vaping Use: Never used  Substance and Sexual Activity   Alcohol use: Yes    Alcohol/week: 0.0 standard drinks    Comment: occas   Drug use: No   Sexual activity: Yes    Comment: 1st intercourse 64 yo-5 partners  Other Topics Concern   Not on file  Social History Narrative   Diet- N/A   Caffeine- Yes   Married- Yes   House- 2 story with 2 people   Pets- No   Current/past profession- Nurse, mental health   Exercise- Yes   Living will-No   DNR-N/A   POA/HPOA-No      Works part-time at [employer] Strain: Not on file  Food Insecurity: Not on file  Transportation Needs: Not on file  Physical Activity: Not on file  Stress: Not on file  Social Connections: Not on file  Intimate Partner Violence: Not on file     Review of  Systems: General: negative for chills, fever, night sweats or weight changes.  Cardiovascular: negative for chest pain, dyspnea on exertion, edema, orthopnea, palpitations, paroxysmal nocturnal dyspnea or shortness of breath Dermatological: negative for rash Respiratory: negative for cough or wheezing Urologic: negative for hematuria Abdominal: negative for nausea, vomiting, diarrhea, bright red blood per rectum, melena, or hematemesis Neurologic: negative for visual changes, syncope, or dizziness All other systems reviewed and are otherwise negative except as noted above.    Blood pressure 116/68, pulse 71, height 5\' 2"  (1.575 m), weight 200 lb 6.4 oz (90.9 kg), SpO2 97 %.  General appearance: alert and no distress Neck: no adenopathy, no carotid bruit, no JVD, supple, symmetrical, trachea  midline, and thyroid not enlarged, symmetric, no tenderness/mass/nodules Lungs: clear to auscultation bilaterally Heart: regular rate and rhythm, S1, S2 normal, no murmur, click, rub or gallop Extremities: extremities normal, atraumatic, no cyanosis or edema Pulses: 2+ and symmetric Skin: Skin color, texture, turgor normal. No rashes or lesions Neurologic: Grossly normal  EKG sinus rhythm at 71 without ST or T wave changes.  I personally reviewed this EKG.  ASSESSMENT AND PLAN:   Accelerated hypertension History of hypertension a blood pressure measured today at 116/68.  She is on labetalol and nifedipine.  Hyperlipidemia History of hyperlipidemia on statin therapy with lipid profile performed 04/18/2021 revealing total cholesterol 139, LDL 58 HDL 66     Lorretta Harp MD [facility], [facility] 01/07/2022 2:30 PM

## 2022-01-07 NOTE — Patient Instructions (Signed)

## 2022-01-07 NOTE — Telephone Encounter (Signed)
Patient states she has been having issues with her hand for the past 9 months. She would like to know if this is something Dr. Gwenlyn Found is able to assist with. She states if he can not help then she would like to cancel her appointment for this afternoon, 2/22 at 2:30 PM. Please advise.

## 2022-01-08 ENCOUNTER — Inpatient Hospital Stay: Payer: Medicare HMO | Admitting: Hematology and Oncology

## 2022-01-08 ENCOUNTER — Encounter: Payer: Self-pay | Admitting: *Deleted

## 2022-01-08 ENCOUNTER — Inpatient Hospital Stay: Payer: Medicare HMO

## 2022-01-08 ENCOUNTER — Encounter (HOSPITAL_COMMUNITY): Payer: Self-pay

## 2022-01-08 DIAGNOSIS — C50411 Malignant neoplasm of upper-outer quadrant of right female breast: Secondary | ICD-10-CM

## 2022-01-08 DIAGNOSIS — Z79899 Other long term (current) drug therapy: Secondary | ICD-10-CM | POA: Diagnosis not present

## 2022-01-08 DIAGNOSIS — Z79811 Long term (current) use of aromatase inhibitors: Secondary | ICD-10-CM | POA: Diagnosis not present

## 2022-01-08 DIAGNOSIS — Z170 Estrogen receptor positive status [ER+]: Secondary | ICD-10-CM

## 2022-01-08 DIAGNOSIS — Z5111 Encounter for antineoplastic chemotherapy: Secondary | ICD-10-CM | POA: Diagnosis not present

## 2022-01-08 DIAGNOSIS — Z95828 Presence of other vascular implants and grafts: Secondary | ICD-10-CM

## 2022-01-08 LAB — CMP (CANCER CENTER ONLY)
ALT: 25 U/L (ref 0–44)
AST: 26 U/L (ref 15–41)
Albumin: 4.3 g/dL (ref 3.5–5.0)
Alkaline Phosphatase: 90 U/L (ref 38–126)
Anion gap: 5 (ref 5–15)
BUN: 23 mg/dL (ref 8–23)
CO2: 29 mmol/L (ref 22–32)
Calcium: 9.4 mg/dL (ref 8.9–10.3)
Chloride: 107 mmol/L (ref 98–111)
Creatinine: 1.48 mg/dL — ABNORMAL HIGH (ref 0.44–1.00)
GFR, Estimated: 39 mL/min — ABNORMAL LOW (ref >60)
Glucose, Bld: 162 mg/dL — ABNORMAL HIGH (ref 70–99)
Potassium: 3.6 mmol/L (ref 3.5–5.1)
Sodium: 141 mmol/L (ref 135–145)
Total Bilirubin: 0.5 mg/dL (ref 0.3–1.2)
Total Protein: 6.7 g/dL (ref 6.5–8.1)

## 2022-01-08 LAB — CBC WITH DIFFERENTIAL (CANCER CENTER ONLY)
Abs Immature Granulocytes: 0.03 10*3/uL (ref 0.00–0.07)
Basophils Absolute: 0.1 10*3/uL (ref 0.0–0.1)
Basophils Relative: 2 %
Eosinophils Absolute: 0.2 10*3/uL (ref 0.0–0.5)
Eosinophils Relative: 6 %
HCT: 32.5 % — ABNORMAL LOW (ref 36.0–46.0)
Hemoglobin: 10.9 g/dL — ABNORMAL LOW (ref 12.0–15.0)
Immature Granulocytes: 1 %
Lymphocytes Relative: 44 %
Lymphs Abs: 1.3 10*3/uL (ref 0.7–4.0)
MCH: 31.1 pg (ref 26.0–34.0)
MCHC: 33.5 g/dL (ref 30.0–36.0)
MCV: 92.9 fL (ref 80.0–100.0)
Monocytes Absolute: 0.5 10*3/uL (ref 0.1–1.0)
Monocytes Relative: 17 %
Neutro Abs: 0.9 10*3/uL — ABNORMAL LOW (ref 1.7–7.7)
Neutrophils Relative %: 30 %
Platelet Count: 315 10*3/uL (ref 150–400)
RBC: 3.5 MIL/uL — ABNORMAL LOW (ref 3.87–5.11)
RDW: 13.5 % (ref 11.5–15.5)
WBC Count: 3 10*3/uL — ABNORMAL LOW (ref 4.0–10.5)
nRBC: 0 % (ref 0.0–0.2)

## 2022-01-08 MED ORDER — SODIUM CHLORIDE 0.9% FLUSH
10.0000 mL | INTRAVENOUS | Status: DC | PRN
  Administered 2022-01-08: 10 mL

## 2022-01-08 MED ORDER — METHOTREXATE SODIUM (PF) CHEMO INJECTION 250 MG/10ML
30.0000 mg/m2 | Freq: Once | INTRAMUSCULAR | Status: AC
  Administered 2022-01-08: 60 mg via INTRAVENOUS
  Filled 2022-01-08: qty 2.4

## 2022-01-08 MED ORDER — FLUOROURACIL CHEMO INJECTION 2.5 GM/50ML
400.0000 mg/m2 | Freq: Once | INTRAVENOUS | Status: AC
  Administered 2022-01-08: 800 mg via INTRAVENOUS
  Filled 2022-01-08: qty 16

## 2022-01-08 MED ORDER — PALONOSETRON HCL INJECTION 0.25 MG/5ML
0.2500 mg | Freq: Once | INTRAVENOUS | Status: AC
  Administered 2022-01-08: 0.25 mg via INTRAVENOUS
  Filled 2022-01-08: qty 5

## 2022-01-08 MED ORDER — SODIUM CHLORIDE 0.9 % IV SOLN
500.0000 mg/m2 | Freq: Once | INTRAVENOUS | Status: AC
  Administered 2022-01-08: 1000 mg via INTRAVENOUS
  Filled 2022-01-08: qty 50

## 2022-01-08 MED ORDER — SODIUM CHLORIDE 0.9% FLUSH
10.0000 mL | Freq: Once | INTRAVENOUS | Status: AC
  Administered 2022-01-08: 10 mL

## 2022-01-08 MED ORDER — SODIUM CHLORIDE 0.9 % IV SOLN: Freq: Once | INTRAVENOUS | Status: AC

## 2022-01-08 MED ORDER — HEPARIN SOD (PORK) LOCK FLUSH 100 UNIT/ML IV SOLN
500.0000 [IU] | Freq: Once | INTRAVENOUS | Status: AC | PRN
  Administered 2022-01-08: 500 [IU]

## 2022-01-08 MED ORDER — SODIUM CHLORIDE 0.9 % IV SOLN
10.0000 mg | Freq: Once | INTRAVENOUS | Status: AC
  Administered 2022-01-08: 10 mg via INTRAVENOUS
  Filled 2022-01-08: qty 10

## 2022-01-08 NOTE — Patient Instructions (Signed)
[facility] ONCOLOGY  Discharge Instructions: Thank you for choosing [facility] to provide your oncology and hematology care.   If you have a lab appointment with the [facility], please go directly to the [facility] and check in at the registration area.   Wear comfortable clothing and clothing appropriate for easy access to any Portacath or PICC line.   We strive to give you quality time with your provider. You may need to reschedule your appointment if you arrive late (15 or more minutes).  Arriving late affects you and other patients whose appointments are after yours.  Also, if you miss three or more appointments without notifying the office, you may be dismissed from the clinic at the providers discretion.      For prescription refill requests, have your pharmacy contact our office and allow 72 hours for refills to be completed.    Today you received the following chemotherapy and/or immunotherapy agents: Cytoxan, Methotrexate, Fluorouracil.       To help prevent nausea and vomiting after your treatment, we encourage you to take your nausea medication as directed.  BELOW ARE SYMPTOMS THAT SHOULD BE REPORTED IMMEDIATELY: *FEVER GREATER THAN 100.4 F (38 C) OR HIGHER *CHILLS OR SWEATING *NAUSEA AND VOMITING THAT IS NOT CONTROLLED WITH YOUR NAUSEA MEDICATION *UNUSUAL SHORTNESS OF BREATH *UNUSUAL BRUISING OR BLEEDING *URINARY PROBLEMS (pain or burning when urinating, or frequent urination) *BOWEL PROBLEMS (unusual diarrhea, constipation, pain near the anus) TENDERNESS IN MOUTH AND THROAT WITH OR WITHOUT PRESENCE OF ULCERS (sore throat, sores in mouth, or a toothache) UNUSUAL RASH, SWELLING OR PAIN  UNUSUAL VAGINAL DISCHARGE OR ITCHING   Items with * indicate a potential emergency and should be followed up as soon as possible or go to the Emergency Department if any problems should occur.  Please show the CHEMOTHERAPY ALERT CARD or  IMMUNOTHERAPY ALERT CARD at check-in to the Emergency Department and triage nurse.  Should you have questions after your visit or need to cancel or reschedule your appointment, please contact [facility]  Dept: [phone]  and follow the prompts.  Office hours are 8:00 a.m. to 4:30 p.m. Monday - Friday. Please note that voicemails left after 4:00 p.m. may not be returned until the following business day.  We are closed weekends and major holidays. You have access to a nurse at all times for urgent questions. Please call the main number to the clinic Dept: [phone] and follow the prompts.   For any non-urgent questions, you may also contact your provider using MyChart. We now offer e-Visits for anyone 2 and older to request care online for non-urgent symptoms. For details visit [email or web address].   Also download the MyChart app! Go to the app store, search "MyChart", open the app, select [facility], and log in with your MyChart username and password.  Due to Covid, a mask is required upon entering the hospital/clinic. If you do not have a mask, one will be given to you upon arrival. For doctor visits, patients may have 1 support person aged 43 or older with them. For treatment visits, patients cannot have anyone with them due to current Covid guidelines and our immunocompromised population.

## 2022-01-09 ENCOUNTER — Ambulatory Visit: Payer: Medicare HMO | Admitting: Hematology and Oncology

## 2022-01-09 ENCOUNTER — Ambulatory Visit: Payer: Medicare HMO

## 2022-01-09 ENCOUNTER — Telehealth: Payer: Self-pay | Admitting: Hematology and Oncology

## 2022-01-09 ENCOUNTER — Telehealth: Payer: Self-pay | Admitting: Nurse Practitioner

## 2022-01-09 ENCOUNTER — Other Ambulatory Visit: Payer: Self-pay | Admitting: Nurse Practitioner

## 2022-01-09 ENCOUNTER — Other Ambulatory Visit: Payer: Medicare HMO

## 2022-01-09 DIAGNOSIS — M79642 Pain in left hand: Secondary | ICD-10-CM

## 2022-01-09 NOTE — Telephone Encounter (Signed)
Scheduled appointment per 02/23 los. Left message with appointment details.

## 2022-01-09 NOTE — Telephone Encounter (Signed)
Referral placed.

## 2022-01-09 NOTE — Telephone Encounter (Signed)
Ms Michele Page called this am stating that she seen cardio(Dr Gwenlyn Found) on 01/07/22 for the lump/knot on her thumb.  Dr Gwenlyn Found has referred her back to pcp bc he doesn't see there's anything he can do on his end.  At this point, Michele Page wants to know does she need a referral to Ortho, hand specialist?  Please advise, Michele Page

## 2022-01-15 ENCOUNTER — Encounter: Payer: Self-pay | Admitting: *Deleted

## 2022-01-15 ENCOUNTER — Telehealth: Payer: Self-pay | Admitting: Radiation Oncology

## 2022-01-15 DIAGNOSIS — C50411 Malignant neoplasm of upper-outer quadrant of right female breast: Secondary | ICD-10-CM

## 2022-01-15 NOTE — Telephone Encounter (Signed)
Called patient to schedule a consultation w. Dr. Moody. No answer, LVM for a return call.  

## 2022-01-19 NOTE — Progress Notes (Incomplete)
New Breast Cancer Diagnosis: Right Breast UOQ  Did patient present with symptoms (if so, please note symptoms) or screening mammography?:Palpable mass     Location and Extent of disease :right breast. Located at 11 o'clock position, measured 2.3 cm in greatest dimension. Adenopathy no.  Histology per Pathology Report: grade 2, Invasive Lobular Carcinoma 09/02/2021  Receptor Status: ER(positive), PR (positive), Her2-neu (negative), Ki-(25%)   Surgeon and surgical plan, if any:  Dr. Brantley Stage -Right Breast Lumpectomy with radioactive seed and SLN biopsy  09/02/2021  -Left radioactive seed guided partial mastectomy with axillary SLN biopsy 04/17/2016   Medical oncologist, treatment if any:   Dr. Lindi Adie -Chemotherapy 10/17/2021 Treatment plan: 1.  Based on for enlarged lymph nodes, recommended systemic chemotherapy with CMF x6 cycles 2. adjuvant radiation therapy 3.  For the continued antiestrogen therapy (previously anastrozole started 07/23/2016 switched to letrozole 2018 switched to tamoxifen 2018, switched back to anastrozole) hair loss was the main problem, may consider exemestane    Family History of Breast/Ovarian/Prostate Cancer:   Lymphedema issues, if any:      Pain issues, if any:     SAFETY ISSUES: Prior radiation?  Pacemaker/ICD?  Possible current pregnancy? Postmenopausal Is the patient on methotrexate?   Current Complaints / other details:   Port Insertion 10/16/2021

## 2022-01-19 NOTE — Progress Notes (Addendum)
Radiation Oncology         [phone] ________________________________  Name: Michele Page        MRN: [id number]  Date of Service: 01/20/2022 DOB: [birth year]  [name], Michele American, NP  Nicholas Lose, MD     REFERRING PHYSICIAN: Nicholas Lose, MD   DIAGNOSIS: The encounter diagnosis was Malignant neoplasm of upper-outer quadrant of right breast in female, estrogen receptor positive ([place]).   HISTORY OF PRESENT ILLNESS: Michele Page is a 66 y.o. female seen at the request of Dr. Lindi Adie for a history of Stage IA, pT1cN0M0, ER/PR positive invasive ductal carcinoma of the left breast diagnosed in 2017, and treated with lumpectomy, adjuvant radiotherapy, and antiestrogen. She continued with antiestrogen therapy. She noted a palapable mass in the right breast last year, and diagnostic imaging showed a 2.3 cm mass in the 11:00 posiiton, and her right axilla was negative for adenopathy. She underwent biopsy of the tumor on 07/24/21 that showed a grade 2 invasive lobular carcinoma that was ER/PR positive, HER2 negative with a Ki 67 of 25%. She underwent lumpectomy of the right breast with sentinel node biopsy 09/02/21. She had a 3.1 cm invasive lobular carcinoma, grade 2 with LVSI. The superior, inferior and lateral margins were <1 mm. She had 6 nodes sampled, and 4 contained metastatic disease, 2 contained isolated tumor cells. Her Oncotype Dx was 20, but with her nodal involvement, she was started on adjuvant chemotherapy on  10/17/21 which she will complete on 01/30/22. She's seen today to discuss adjuvant radiotherapy to the right breast and regional nodes.     PREVIOUS RADIATION THERAPY: Yes   05/27/16- 07/13/16 1. Left breast treated with breath hold to 50.4 Gy in 28 fractions at 1.8 Gy/fraction 2. Left breast boosted to 10 Gy in 5 fractions at 2 Gy/fraction   PAST MEDICAL HISTORY:  Past Medical History:  Diagnosis Date   Anemia    past hx many yrs ago    Arthritis    lt knee   Breast cancer  ([place])    Breast cancer of upper-outer quadrant of left female breast ([employer]) 04/02/2016   Chronic kidney disease    CKD   Colon polyps    COPD (chronic obstructive pulmonary disease) ([name])    GERD (gastroesophageal reflux disease)    years ago- not current    Hyperlipidemia    Hypertension    Multiple sclerosis ([place])    Personal history of chemotherapy    Personal history of radiation therapy    Stroke ([place])    2013, mild cognitive deficits   Vision abnormalities        PAST SURGICAL HISTORY: Past Surgical History:  Procedure Laterality Date   BREAST BIOPSY Left 03/31/2016   BREAST BIOPSY Right 07/24/2021   BREAST LUMPECTOMY Left 04/17/2016   BREAST LUMPECTOMY WITH RADIOACTIVE SEED AND SENTINEL LYMPH NODE BIOPSY Right 09/02/2021   Procedure: RIGHT BREAST LUMPECTOMY WITH RADIOACTIVE SEED AND SENTINEL LYMPH NODE BIOPSY;  Surgeon: Erroll Luna, MD;  Location: [facility];  Service: General;  Laterality: Right;   COLONOSCOPY     HYSTEROSCOPY WITH D & C N/A 12/19/2015   Procedure: DILATATION AND CURETTAGE /HYSTEROSCOPY;  Surgeon: Linda Hedges, DO;  Location: [facility];  Service: Gynecology;  Laterality: N/A;   POLYPECTOMY     PORTACATH PLACEMENT Right 10/16/2021   Procedure: INSERTION PORT-A-CATH;  Surgeon: Erroll Luna, MD;  Location: [place];  Service: General;  Laterality: Right;   RADIOACTIVE SEED GUIDED  PARTIAL MASTECTOMY WITH AXILLARY SENTINEL LYMPH NODE BIOPSY Left 04/17/2016   Procedure: RADIOACTIVE SEED GUIDED PARTIAL MASTECTOMY WITH AXILLARY SENTINEL LYMPH NODE BIOPSY;  Surgeon: Erroll Luna, MD;  Location: [place] [facility];  Service: General;  Laterality: Left;  RADIOACTIVE SEED GUIDED PARTIAL MASTECTOMY WITH AXILLARY SENTINEL LYMPH NODE BIOPSY    TUBAL LIGATION     VAGINAL DELIVERY     x3     FAMILY HISTORY:  Family History  Problem Relation Age of Onset   Stroke Sister    Hypertension Mother    Stroke Mother    Alcohol  abuse Brother    HIV/AIDS Brother    Colon cancer Neg Hx    Colon polyps Neg Hx    Esophageal cancer Neg Hx    Rectal cancer Neg Hx    Stomach cancer Neg Hx      SOCIAL HISTORY:  reports that she quit smoking about 17 years ago. Her smoking use included cigarettes. She has a 12.50 pack-year smoking history. She has never used smokeless tobacco. She reports current alcohol use. She reports that she does not use drugs.The patient is married and lives in [place]. She is originally from [place]. She is active in her church.    ALLERGIES: Patient has no known allergies.   MEDICATIONS:  Current Outpatient Medications  Medication Sig Dispense Refill   aspirin EC 81 MG tablet Take 1 tablet (81 mg total) by mouth daily. 30 tablet 6   atorvastatin (LIPITOR) 20 MG tablet TAKE 1 TABLET EVERY DAY 90 tablet 1   eszopiclone (LUNESTA) 2 MG TABS tablet Take one tablet by mouth at bedtime as needed for sleep (take immediately before bedtime) 30 tablet 0   labetalol (NORMODYNE) 300 MG tablet TAKE 2 TABLETS THREE TIMES DAILY 540 tablet 1   NIFEdipine (PROCARDIA XL/NIFEDICAL XL) 60 MG 24 hr tablet Take 1 tablet (60 mg total) by mouth 2 (two) times daily. 180 tablet 3   No current facility-administered medications for this visit.     REVIEW OF SYSTEMS: On review of systems, the patient reports that she is doing okay. She still is quite frustrated with the fact she developed a second cancer despite antiestrogen therapy and screening of her right breast. She is tolerating chemotherapy overall but does feel tired the days following her infusion. She also feels like her right breast is larger than her left but no obvious change in her skin have been noted or swelling of her extremities. She has not met with PT for sozo measuremets, and is concerned about her risks of future cancers. No other complaints are verbalized.      PHYSICAL EXAM:  Wt Readings from Last 3 Encounters:  01/08/22 207 lb (93.9 kg)   01/07/22 200 lb 6.4 oz (90.9 kg)  12/18/21 208 lb 9.6 oz (94.6 kg)   Temp Readings from Last 3 Encounters:  01/08/22 97.7 F (36.5 C) (Temporal)  12/18/21 97.7 F (36.5 C) (Temporal)  11/28/21 (!) 97.3 F (36.3 C) (Temporal)   BP Readings from Last 3 Encounters:  01/08/22 (!) 113/59  01/07/22 116/68  12/18/21 138/71   Pulse Readings from Last 3 Encounters:  01/08/22 81  01/07/22 71  12/18/21 75    In general this is a well appearing African Page female in no acute distress. She's alert and oriented x4 and appropriate throughout the examination. Cardiopulmonary assessment is negative for acute distress and she exhibits normal effort. Her right breast has well healed incision site along the  areola but no lymphangitic changes that can be apprecaited but her breast is quite large overall.     ECOG = 1  0 - Asymptomatic (Fully active, able to carry on all predisease activities without restriction)  1 - Symptomatic but completely ambulatory (Restricted in physically strenuous activity but ambulatory and able to carry out work of a light or sedentary nature. For example, light housework, office work)  2 - Symptomatic, <50% in bed during the day (Ambulatory and capable of all self care but unable to carry out any work activities. Up and about more than 50% of waking hours)  3 - Symptomatic, >50% in bed, but not bedbound (Capable of only limited self-care, confined to bed or chair 50% or more of waking hours)  4 - Bedbound (Completely disabled. Cannot carry on any self-care. Totally confined to bed or chair)  5 - Death   Eustace Pen MM, Creech RH, Tormey DC, et al. [phone]). "Toxicity and response criteria of the [facility]". [place] Oncol. 5 (6): 649-55    LABORATORY DATA:  Lab Results  Component Value Date   WBC 3.0 (L) 01/08/2022   HGB 10.9 (L) 01/08/2022   HCT 32.5 (L) 01/08/2022   MCV 92.9 01/08/2022   PLT 315 01/08/2022   Lab Results   Component Value Date   NA 141 01/08/2022   K 3.6 01/08/2022   CL 107 01/08/2022   CO2 29 01/08/2022   Lab Results  Component Value Date   ALT 25 01/08/2022   AST 26 01/08/2022   ALKPHOS 90 01/08/2022   BILITOT 0.5 01/08/2022      RADIOGRAPHY: No results found.     IMPRESSION/PLAN: 1. Stage IB, pT2N2M0 grade 2, ER/PR positive invasive lobular carcinoma of the right breast. Dr. Lisbeth Renshaw discusses the pathology findings and reviews the nature of right sided breast disease. She has done well since surgery and chemotherapy. Dr. Lisbeth Renshaw recommends external radiotherapy to the breast  and regional nodes to reduce risks of local recurrence followed by antiestrogen therapy. We discussed the risks, benefits, short, and long term effects of radiotherapy, as well as the curative intent, and the patient is interested in proceeding. Dr. Lisbeth Renshaw discusses the delivery and logistics of radiotherapy and anticipates a course of 6 1/2 weeks of radiotherapy to the right breast and regional nodes. Written consent is obtained and placed in the chart, a copy was provided to the patient. She will be contacted to coordinate simulation by our staff early April after her chemotherapy has completed. 2. Stage IA, pT1cN0M0, ER/PR positive invasive ductal carcinoma of the left breast. She remains in surveillance and will be followed for this as she is followed for #1.  3. Possible genetic predisposition to malignancy. The patient is a candidate for genetic testing given her personal history. She was offered referral and will be seen by genetics at her convenience. 4. Baseline evaluation of lymphedema riskts. I will set her up to meet with PT so she can start being followed in their clinic and appreciate their input given her breast assymmetry.   In a visit lasting 60 minutes, greater than 50% of the time was spent face to face reviewing her case, as well as in preparation of, discussing, and coordinating the patient's  care.  The above documentation reflects my direct findings during this shared patient visit. Please see the separate note by Dr. Lisbeth Renshaw on this date for the remainder of the patient's plan of care.    Lalla Brothers  Dara Lords, [facility]    **Disclaimer: This note was dictated with voice recognition software. Similar sounding words can inadvertently be transcribed and this note may contain transcription errors which may not have been corrected upon publication of note.**

## 2022-01-20 ENCOUNTER — Ambulatory Visit
Admission: RE | Admit: 2022-01-20 | Discharge: 2022-01-20 | Disposition: A | Discharge status: Home / Self Care | Payer: Medicare HMO | Source: Ambulatory Visit | Attending: Radiation Oncology | Admitting: Radiation Oncology

## 2022-01-20 ENCOUNTER — Encounter: Payer: Self-pay | Admitting: Radiation Oncology

## 2022-01-20 ENCOUNTER — Other Ambulatory Visit: Payer: Self-pay

## 2022-01-20 VITALS — BP 133/75 | HR 75 | Temp 97.8°F | Resp 20 | Ht 62.0 in | Wt 203.6 lb

## 2022-01-20 DIAGNOSIS — Z79899 Other long term (current) drug therapy: Secondary | ICD-10-CM | POA: Insufficient documentation

## 2022-01-20 DIAGNOSIS — Z7982 Long term (current) use of aspirin: Secondary | ICD-10-CM | POA: Diagnosis not present

## 2022-01-20 DIAGNOSIS — K219 Gastro-esophageal reflux disease without esophagitis: Secondary | ICD-10-CM | POA: Diagnosis not present

## 2022-01-20 DIAGNOSIS — Z87891 Personal history of nicotine dependence: Secondary | ICD-10-CM | POA: Diagnosis not present

## 2022-01-20 DIAGNOSIS — N189 Chronic kidney disease, unspecified: Secondary | ICD-10-CM | POA: Diagnosis not present

## 2022-01-20 DIAGNOSIS — I129 Hypertensive chronic kidney disease with stage 1 through stage 4 chronic kidney disease, or unspecified chronic kidney disease: Secondary | ICD-10-CM | POA: Insufficient documentation

## 2022-01-20 DIAGNOSIS — C50411 Malignant neoplasm of upper-outer quadrant of right female breast: Secondary | ICD-10-CM | POA: Diagnosis not present

## 2022-01-20 DIAGNOSIS — Z8673 Personal history of transient ischemic attack (TIA), and cerebral infarction without residual deficits: Secondary | ICD-10-CM | POA: Diagnosis not present

## 2022-01-20 DIAGNOSIS — Z86010 Personal history of colonic polyps: Secondary | ICD-10-CM | POA: Diagnosis not present

## 2022-01-20 DIAGNOSIS — Z170 Estrogen receptor positive status [ER+]: Secondary | ICD-10-CM

## 2022-01-20 DIAGNOSIS — G35 Multiple sclerosis: Secondary | ICD-10-CM | POA: Insufficient documentation

## 2022-01-20 DIAGNOSIS — Z853 Personal history of malignant neoplasm of breast: Secondary | ICD-10-CM | POA: Diagnosis not present

## 2022-01-20 DIAGNOSIS — Z9221 Personal history of antineoplastic chemotherapy: Secondary | ICD-10-CM | POA: Insufficient documentation

## 2022-01-20 DIAGNOSIS — M1712 Unilateral primary osteoarthritis, left knee: Secondary | ICD-10-CM | POA: Insufficient documentation

## 2022-01-20 DIAGNOSIS — C50412 Malignant neoplasm of upper-outer quadrant of left female breast: Secondary | ICD-10-CM | POA: Diagnosis not present

## 2022-01-20 DIAGNOSIS — E785 Hyperlipidemia, unspecified: Secondary | ICD-10-CM | POA: Diagnosis not present

## 2022-01-20 DIAGNOSIS — Z923 Personal history of irradiation: Secondary | ICD-10-CM | POA: Insufficient documentation

## 2022-01-20 DIAGNOSIS — J449 Chronic obstructive pulmonary disease, unspecified: Secondary | ICD-10-CM | POA: Insufficient documentation

## 2022-01-20 NOTE — Progress Notes (Signed)
New Breast Cancer Diagnosis: Right Breast UOQ ? ?Did patient present with symptoms (if so, please note symptoms) or screening mammography?:Palpable mass   ? ? ?Location and Extent of disease :right breast. Located at 11 o'clock position, measured 2.3 cm in greatest dimension. Adenopathy no. ? ?Histology per Pathology Report: grade 2, Invasive Lobular Carcinoma 09/02/2021 ? ?Receptor Status: ER(positive), PR (positive), Her2-neu (negative), Ki-(25%) ? ? ?Surgeon and surgical plan, if any:  ?Dr. Brantley Stage ?-Right Breast Lumpectomy with radioactive seed and SLN biopsy  09/02/2021 ? ?-Left radioactive seed guided partial mastectomy with axillary SLN biopsy 04/17/2016 ? ? ?Medical oncologist, treatment if any:   ?Dr. Lindi Adie ?-Chemotherapy 10/17/2021 ?Treatment plan: ?1.  Based on for enlarged lymph nodes, recommended systemic chemotherapy with CMF x6 cycles ?2. adjuvant radiation therapy ?3.  For the continued antiestrogen therapy (previously anastrozole started 07/23/2016 switched to letrozole 2018 switched to tamoxifen 2018, switched back to anastrozole) hair loss was the main problem, may consider exemestane ? ? ? ?Family History of Breast/Ovarian/Prostate Cancer:  ? ?Lymphedema issues, if any:    none ? ?Pain issues, if any:   none ? ?SAFETY ISSUES: ?Prior radiation? Yes  6 years ago  ?Pacemaker/ICD? no ?Possible current pregnancy? Postmenopausal ?Is the patient on methotrexate? no ? ?Current Complaints / other details:   ?Port Insertion 10/16/2021 ?Vitals:  ? 01/20/22 1338  ?BP: 133/75  ?Pulse: 75  ?Resp: 20  ?Temp: 97.8 ?F (36.6 ?C)  ?SpO2: 97%  ?Weight: 92.4 kg  ?Height: 5' 2"  (1.575 m)  ? ? ?

## 2022-01-21 ENCOUNTER — Telehealth: Payer: Self-pay | Admitting: Hematology and Oncology

## 2022-01-21 NOTE — Telephone Encounter (Signed)
Scheduled appointment per inbasket message. Left message.   ?

## 2022-01-22 ENCOUNTER — Other Ambulatory Visit: Payer: Self-pay

## 2022-01-22 ENCOUNTER — Encounter: Payer: Medicare HMO | Admitting: Nurse Practitioner

## 2022-01-27 ENCOUNTER — Encounter: Payer: Medicare HMO | Admitting: Nurse Practitioner

## 2022-01-27 DIAGNOSIS — M7989 Other specified soft tissue disorders: Secondary | ICD-10-CM | POA: Diagnosis not present

## 2022-01-27 DIAGNOSIS — M1812 Unilateral primary osteoarthritis of first carpometacarpal joint, left hand: Secondary | ICD-10-CM | POA: Diagnosis not present

## 2022-01-28 NOTE — Progress Notes (Signed)
? ?Patient Care Team: ?[name], NP as PCP - General (Geriatric Medicine) ?Lorretta Harp, MD as PCP - Cardiology (Cardiology) ?Nicholas Lose, MD as Consulting Physician (Oncology) ?Erroll Luna, MD as Consulting Physician (General Surgery) ? ?DIAGNOSIS:  ?Encounter Diagnosis  ?Name Primary?  ? Malignant neoplasm of upper-outer quadrant of right breast in female, estrogen receptor positive ([place])   ? ? ?SUMMARY OF ONCOLOGIC HISTORY: ?Oncology History  ?Breast cancer of upper-outer quadrant of left female breast ([place])  ?03/31/2016 Initial Diagnosis  ? Screening detected left breast mass, 2 nodules, 1.9 x 1.6 x 0.8 cm= fat necrosis; 8 x 7 x 7 mm= grade 2 IDC ER 100%, PR 100%, HER-2 negative ratio 1.39, Ki-67 20% ?  ?04/17/2016 Surgery  ? Left lumpectomy ([name]): IDC grade 2, 1.3 cm, with DCIS, margins negative, 0/4 lymph nodes negative, T1 cN0 stage IA pathologic stage, ER 100%, PR 100%, HER-2 negative ratio 1.39, Ki-67 20% Oncotype DX score 20, 13% ROR, intermediate risk ?  ?04/17/2016 Oncotype testing  ? Recurrence score: 20; ROR 15% (intermediate risk)  ?  ?05/27/2016 - 07/13/2016 Radiation Therapy  ? Adjuvant radiation therapy [facility]): Left breast treated with breath hold to 50.4 Gy in 28 fractions at 1.8 Gy/fraction.  Left breast boosted to 10 Gy in 5 fractions at 2 Gy/fraction ?  ?07/23/2016 -  Anti-estrogen oral therapy  ? Anastrozole 1 mg switched to letrozole 04/22/2017 due to hair loss, switch to tamoxifen 10/22/2017 due to hair loss from letrozole as well ?  ?Malignant neoplasm of upper-outer quadrant of right breast in female, estrogen receptor positive ([place])  ?08/04/2021 Initial Diagnosis  ? Malignant neoplasm of upper-outer quadrant of right breast in female, estrogen receptor positive ([place]) ?  ? Relapse/Recurrence  ? 2.2 cm ILC ER 100%, PR 90%, Her 2 Neg, Ki 67: 25%, Size 2.5 cm ?  ?08/04/2021 Cancer Staging  ? Staging form: Breast, AJCC 8th Edition ?- Clinical: Stage IB (cT2, cN0, cM0, G2, ER+,  PR+, HER2-) - Signed by Nicholas Lose, MD on 08/04/2021 ?Stage prefix: Initial diagnosis ?Histologic grading system: 3 grade system ? ?  ?09/02/2021 Surgery  ? Right lumpectomy: Grade 2 ILC 3.1 cm, margins negative, LVI present, 4/6 lymph nodes macrometastases, 2 lymph nodes isolated tumor cells, ER 100%, PR 90%, Ki-67 25%, HER2 negative ?  ?10/17/2021 -  Chemotherapy  ? Patient is on Treatment Plan : BREAST Adjuvant CMF IV q21d  ?   ? ? ?CHIEF COMPLIANT: CMF Cycle 6 ? ?INTERVAL HISTORY: Michele Page is a 66 y.o. with above-mentioned history of breast cancer, currently on adjuvant chemotherapy with CMF. She presents to the clinic today for treatment.  Does not have any adverse effects to CMF chemotherapy.  She is tolerating chemo extremely well without any problems or concerns. ? ? ?ALLERGIES:  has No Known Allergies. ? ?MEDICATIONS:  ?Current Outpatient Medications  ?Medication Sig Dispense Refill  ? anastrozole (ARIMIDEX) 1 MG tablet Take 1 mg by mouth daily. (Patient not taking: Reported on 01/20/2022)    ? aspirin EC 81 MG tablet Take 1 tablet (81 mg total) by mouth daily. 30 tablet 6  ? atorvastatin (LIPITOR) 20 MG tablet TAKE 1 TABLET EVERY DAY 90 tablet 1  ? eszopiclone (LUNESTA) 2 MG TABS tablet Take one tablet by mouth at bedtime as needed for sleep (take immediately before bedtime) 30 tablet 0  ? labetalol (NORMODYNE) 300 MG tablet TAKE 2 TABLETS THREE TIMES DAILY 540 tablet 1  ? NIFEdipine (PROCARDIA XL/NIFEDICAL XL) 60  MG 24 hr tablet Take 1 tablet (60 mg total) by mouth 2 (two) times daily. 180 tablet 3  ? ?No current facility-administered medications for this visit.  ? ? ?PHYSICAL EXAMINATION: ?ECOG PERFORMANCE STATUS: 0 - Asymptomatic ? ?Vitals:  ? 01/30/22 1225  ?BP: (!) 165/76  ?Pulse: 73  ?Resp: 16  ?Temp: (!) 97.3 ?F (36.3 ?C)  ?SpO2: 97%  ? ?Filed Weights  ? 01/30/22 1225  ?Weight: 199 lb 6.4 oz (90.4 kg)  ? ?  ? ?LABORATORY DATA:  ?I have reviewed the data as listed ?CMP Latest Ref Rng & Units  01/30/2022 01/08/2022 12/18/2021  ?Glucose 70 - 99 mg/dL 150(H) 162(H) 140(H)  ?BUN 8 - 23 mg/dL 24(H) 23 19  ?Creatinine 0.44 - 1.00 mg/dL 1.80(H) 1.48(H) 1.24(H)  ?Sodium 135 - 145 mmol/L 141 141 140  ?Potassium 3.5 - 5.1 mmol/L 3.5 3.6 4.0  ?Chloride 98 - 111 mmol/L 106 107 107  ?CO2 22 - 32 mmol/L _0 ?Calcium 8.9 - 10.3 mg/dL 9.6 9.4 9.4  ?Total Protein 6.5 - 8.1 g/dL 7.1 6.7 6.8  ?Total Bilirubin 0.3 - 1.2 mg/dL 0.4 0.5 0.3  ?Alkaline Phos 38 - 126 U/L 100 90 99  ?AST 15 - 41 U/L _1 ?ALT 0 - 44 U/L _2 ? ? ?Lab Results  ?Component Value Date  ? WBC 3.8 (L) 01/30/2022  ? HGB 11.4 (L) 01/30/2022  ? HCT 33.3 (L) 01/30/2022  ? MCV 92.2 01/30/2022  ? PLT 326 01/30/2022  ? NEUTROABS 1.8 01/30/2022  ? ? ?ASSESSMENT & PLAN:  ?Malignant neoplasm of upper-outer quadrant of right breast in female, estrogen receptor positive ([place]) ?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, 1.3 cm, with DCIS, margins negative, 0/4 lymph nodes negative, T1 cN0 stage IA pathologic stage, ER 100%, PR 100%, HER-2 negative ratio 1.39, Ki-67 20% ?(Originally 2 nodules were detected on screening mammogram 1.9 cm= fat necrosis and 8 mm IDC) ?Oncotype DX score 20, 13% risk of recurrence, intermediate risk ?Adjuvant radiation therapy started 07/20/2017completed 07/13/2016 ?  ?  ?09/02/2021:Right lumpectomy: Grade 2 ILC 3.1 cm, margins negative, LVI present, 4/6 lymph nodes macrometastases, 2 lymph nodes isolated tumor cells, ER 100%, PR 90%, Ki-67 25%, HER2 negative ?  ?Treatment plan: ?1.  Based on for enlarged lymph nodes, recommended systemic chemotherapy with CMF x6 cycles ?2. adjuvant radiation therapy ?3.  For the continued antiestrogen therapy (previously anastrozole started 07/23/2016 switched to letrozole 2018 switched to tamoxifen 2018, switched back to anastrozole) hair loss was the main problem, may consider  exemestane ?------------------------------------------------------------------------------------------------------------------------------- ?10/15/2021: CT CAP: No metastatic disease.  Descending and sigmoid colon diverticulosis, several tiny pulmonary nodules less than 5 mm, 2.1 cm left thyroid nodule ?  ?Current treatment: Cycle 6 CMF ?  ?Chemo Toxicities: Tolerating chemotherapy extremely well without any side effects. ?Leukopenia: Reduced the dosage of chemotherapy for cycle 2. ?Leukopenia: ANC 1.8 today. ?I sent a message to Dr. Brantley Stage to remove the port. ?RTC after radiation is complete to start antiestrogen therapy ? ? ? ?No orders of the defined types were placed in this encounter. ? ?The patient has a good understanding of the overall plan. she agrees with it. she will call with any problems that may develop before the next visit here. ?Total time spent: 30 mins including face to face time and time spent for planning, charting and co-ordination of care ? ? Harriette Ohara, MD ?01/30/22 ? ? ? I, Deritra Lannie Fields, am acting as a  scribe for Dr. Lindi Adie  ?

## 2022-01-29 ENCOUNTER — Telehealth: Payer: Self-pay

## 2022-01-29 ENCOUNTER — Ambulatory Visit: Payer: Medicare HMO | Admitting: Nurse Practitioner

## 2022-01-29 ENCOUNTER — Encounter: Payer: Self-pay | Admitting: Nurse Practitioner

## 2022-01-29 ENCOUNTER — Other Ambulatory Visit: Payer: Self-pay

## 2022-01-29 DIAGNOSIS — Z0000 Encounter for general adult medical examination without abnormal findings: Secondary | ICD-10-CM | POA: Diagnosis not present

## 2022-01-29 NOTE — Progress Notes (Signed)
This service is provided via telemedicine ? ?No vital signs collected/recorded due to the encounter was a telemedicine visit.  ? ?Location of patient (ex: home, work):  Home ? ?Patient consents to a telephone visit:  Yes, see encounter dated 01/29/2022 ? ?Location of the provider (ex: office, home):  [place] ? ?Name of any referring provider: .N/A ? ?Names of all persons participating in the telemedicine service and their role in the encounter:  [name], Nurse Practitioner, Carroll Kinds, CMA, and patient.  ? ?Time spent on call:  11 minutes with medical assistant  ?

## 2022-01-29 NOTE — Progress Notes (Signed)
Subjective:   Michele Page is a 66 y.o. female who presents for Medicare Annual (Subsequent) preventive examination.  Review of Systems     Cardiac Risk Factors include: family history of premature cardiovascular disease;obesity (BMI >30kg/m2);hypertension;dyslipidemia;advanced age (>2men, >36 women)     Objective:    Today's Vitals   01/29/22 1434  PainSc: 0-No pain   There is no height or weight on file to calculate BMI.  Advanced Directives 01/29/2022 01/20/2022 10/16/2021 10/07/2021 09/02/2021 08/26/2021 04/18/2021  Does Patient Have a Medical Advance Directive? No No No No No No No  Would patient like information on creating a medical advance directive? - No - Patient declined No - Patient declined No - Patient declined No - Patient declined No - Patient declined No - Patient declined    Current Medications (verified) Outpatient Encounter Medications as of 01/29/2022  Medication Sig   aspirin EC 81 MG tablet Take 1 tablet (81 mg total) by mouth daily.   atorvastatin (LIPITOR) 20 MG tablet TAKE 1 TABLET EVERY DAY   eszopiclone (LUNESTA) 2 MG TABS tablet Take one tablet by mouth at bedtime as needed for sleep (take immediately before bedtime)   labetalol (NORMODYNE) 300 MG tablet TAKE 2 TABLETS THREE TIMES DAILY   NIFEdipine (PROCARDIA XL/NIFEDICAL XL) 60 MG 24 hr tablet Take 1 tablet (60 mg total) by mouth 2 (two) times daily.   anastrozole (ARIMIDEX) 1 MG tablet Take 1 mg by mouth daily. (Patient not taking: Reported on 01/20/2022)   No facility-administered encounter medications on file as of 01/29/2022.    Allergies (verified) Patient has no known allergies.   History: Past Medical History:  Diagnosis Date   Anemia    past hx many yrs ago    Arthritis    lt knee   Breast cancer ([name])    Breast cancer of 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) 04/02/2016   Chronic kidney disease    CKD   Colon polyps    COPD (chronic obstructive pulmonary disease) ([name])     GERD (gastroesophageal reflux disease)    years ago- not current    Hyperlipidemia    Hypertension    Multiple sclerosis ([name])    Personal history of chemotherapy    Personal history of radiation therapy    Stroke ([name])    2013, mild cognitive deficits   Vision abnormalities    Past Surgical History:  Procedure Laterality Date   BREAST BIOPSY Left 03/31/2016   BREAST BIOPSY Right 07/24/2021   BREAST LUMPECTOMY Left 04/17/2016   BREAST LUMPECTOMY WITH RADIOACTIVE SEED AND SENTINEL LYMPH NODE BIOPSY Right 09/02/2021   Procedure: RIGHT BREAST LUMPECTOMY WITH RADIOACTIVE SEED AND SENTINEL LYMPH NODE BIOPSY;  Surgeon: Harriette Bouillon, MD;  Location: [facility];  Service: General;  Laterality: Right;   COLONOSCOPY     HYSTEROSCOPY WITH D & C N/A 12/19/2015   Procedure: DILATATION AND CURETTAGE /HYSTEROSCOPY;  Surgeon: Mitchel Honour, DO;  Location: [facility];  Service: Gynecology;  Laterality: N/A;   POLYPECTOMY     PORTACATH PLACEMENT Right 10/16/2021   Procedure: INSERTION PORT-A-CATH;  Surgeon: Harriette Bouillon, MD;  Location: [facility];  Service: General;  Laterality: Right;   RADIOACTIVE SEED GUIDED PARTIAL MASTECTOMY WITH AXILLARY SENTINEL LYMPH NODE BIOPSY Left 04/17/2016   Procedure: RADIOACTIVE SEED GUIDED PARTIAL MASTECTOMY WITH AXILLARY SENTINEL LYMPH NODE BIOPSY;  Surgeon: Harriette Bouillon, MD;  Location:  SURGERY CENTER;  Service: General;  Laterality: Left;  RADIOACTIVE SEED GUIDED  PARTIAL MASTECTOMY WITH AXILLARY SENTINEL LYMPH NODE BIOPSY    TUBAL LIGATION     VAGINAL DELIVERY     x3   Family History  Problem Relation Age of Onset   Stroke Sister    Hypertension Mother    Stroke Mother    Alcohol abuse Brother    HIV/AIDS Brother    Colon cancer Neg Hx    Colon polyps Neg Hx    Esophageal cancer Neg Hx    Rectal cancer Neg Hx    Stomach cancer Neg Hx    Social History   Socioeconomic History   Marital status: Married    Spouse  name: Not on file   Number of children: 3   Years of education: Not on file   Highest education level: Not on file  Occupational History   Occupation: retired  Tobacco Use   Smoking status: Former    Packs/day: 0.50    Years: 25.00    Pack years: 12.50    Types: Cigarettes    Quit date: 11/16/2004    Years since quitting: 17.2   Smokeless tobacco: Never  Vaping Use   Vaping Use: Never used  Substance and Sexual Activity   Alcohol use: Not Currently    Comment: occas   Drug use: No   Sexual activity: Yes    Comment: 1st intercourse 2 yo-5 partners  Other Topics Concern   Not on file  Social History Narrative   Diet- N/A   Caffeine- Yes   Married- Yes   House- 2 story with 2 people   Pets- No   Current/past profession- Research officer, 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   Exercise- Yes   Living will-No   DNR-N/A   POA/HPOA-No      Works part-time at [employer]   Social Determinants of Corporate investment banker Strain: Not on file  Food Insecurity: Not on file  Transportation Needs: Not on file  Physical Activity: Not on file  Stress: Not on file  Social Connections: Not on file    Tobacco Counseling Counseling given: Not Answered   Clinical Intake:  Pre-visit preparation completed: Yes  Pain : No/denies pain Pain Score: 0-No pain     BMI - recorded: 37 Nutritional Risks: None Diabetes: No  How often do you need to have someone help you when you read instructions, pamphlets, or other written materials from your doctor or pharmacy?: 1 - Never  Diabetic?no         Activities of Daily Living In your present state of health, do you have any difficulty performing the following activities: 01/29/2022 10/16/2021  Hearing? N N  Vision? N Y  Difficulty concentrating or making decisions? Y N  Comment trouble remembering due to stroke -  Walking or climbing stairs? N N  Dressing or bathing? N N  Doing errands, shopping? N -  Preparing Food and eating ? N -  Using the Toilet? N -   In the past six months, have you accidently leaked urine? N -  Do you have problems with loss of bowel control? N -  Managing your Medications? N -  Managing your Finances? N -  Housekeeping or managing your Housekeeping? N -  Some recent data might be hidden    Patient Care Team: Sharon Seller, NP as PCP - General (Geriatric Medicine) Runell Gess, MD as PCP - Cardiology (Cardiology) Serena Croissant, MD as Consulting Physician (Oncology) Harriette Bouillon, MD as Consulting Physician (General Surgery)  Indicate any  recent Medical Services you may have received from other than [facility] providers in the past year (date may be approximate).     Assessment:   This is a routine wellness examination for Michele Page.  Hearing/Vision screen Hearing Screening - Comments:: Patient has no hearing problems. Vision Screening - Comments:: No vision problems. Patient has eye exam within past year. Patient can't remember doctor's name.  Dietary issues and exercise activities discussed: Current Exercise Habits: Home exercise routine, Type of exercise: treadmill;Other - see comments, Time (Minutes): 60, Frequency (Times/Week): 3, Weekly Exercise (Minutes/Week): 180   Goals Addressed   None    Depression Screen PHQ 2/9 Scores 01/29/2022 12/10/2020 11/20/2020 09/19/2020 05/13/2020 12/08/2018 12/08/2018  PHQ - 2 Score 0 0 0 0 0 0 0    Fall Risk Fall Risk  01/29/2022 04/18/2021 12/13/2020 12/10/2020 11/20/2020  Falls in the past year? 0 0 0 0 0  Number falls in past yr: 0 0 0 0 0  Injury with Fall? 0 0 - 0 -  Risk for fall due to : No Fall Risks - - - -  Follow up Falls evaluation completed - - - -    FALL RISK PREVENTION PERTAINING TO THE HOME:  Any stairs in or around the home? Yes  If so, are there any without handrails? No  Home free of loose throw rugs in walkways, pet beds, electrical cords, etc? Yes  Adequate lighting in your home to reduce risk of falls? Yes   ASSISTIVE DEVICES UTILIZED TO  PREVENT FALLS:  Life alert? No  Use of a cane, walker or w/c? No  Grab bars in the bathroom? Yes  Shower chair or bench in shower? No  Elevated toilet seat or a handicapped toilet? No   TIMED UP AND GO:  Was the test performed? No .   Cognitive Function: MMSE - Mini Mental State Exam 12/11/2016  Orientation to time 5  Orientation to Place 4  Registration 3  Attention/ Calculation 5  Recall 2  Language- name 2 objects 2  Language- repeat 1  Language- follow 3 step command 2  Language- read & follow direction 1  Write a sentence 1  Copy design 0  Total score 26     6CIT Screen 01/29/2022 12/10/2020  What Year? 0 points 0 points  What month? 0 points 0 points  What time? 0 points 0 points  Count back from 20 0 points 0 points  Months in reverse 4 points 0 points  Repeat phrase 10 points 2 points  Total Score 14 2    Immunizations Immunization History  Administered Date(s) Administered   Influenza,inj,Quad PF,6+ Mos 08/07/2015, 08/06/2016, 11/24/2017, 10/26/2018, 07/12/2019   PPD Test 07/13/2017   Pneumococcal Conjugate-13 04/18/2021    TDAP status: Up to date  Flu Vaccine status: Up to date  Pneumococcal vaccine status: Up to date  Covid-19 vaccine status: Declined, Education has been provided regarding the importance of this vaccine but patient still declined. Advised may receive this vaccine at local pharmacy or Health Dept.or vaccine clinic. Aware to provide a copy of the vaccination record if obtained from local pharmacy or Health Dept. Verbalized acceptance and understanding.  Qualifies for Shingles Vaccine? No   Zostavax completed No   Shingrix Completed?: No.    Education has been provided regarding the importance of this vaccine. Patient has been advised to call insurance company to determine out of pocket expense if they have not yet received this vaccine. Advised may also receive vaccine at  local pharmacy or Health Dept. Verbalized acceptance and  understanding.  Screening Tests Health Maintenance  Topic Date Due   Pneumonia Vaccine 18+ Years old (2 - PPSV23 if available, else PCV20) 04/18/2022   INFLUENZA VACCINE  02/13/2022 (Originally 06/16/2021)   TETANUS/TDAP  11/17/2023 (Originally [birth year]1974)   COVID-19 Vaccine (1) 11/17/2023 (Originally 02/01/1957)   Zoster Vaccines- Shingrix (1 of 2) 11/17/2023 (Originally 08[birth year]03/1974)   MAMMOGRAM  12/09/2022   COLONOSCOPY (Pts 45-52yrs Insurance coverage will need to be confirmed)  01/14/2024   DEXA SCAN  Completed   Hepatitis C Screening  Completed   HPV VACCINES  Aged Out    Health Maintenance  Health Maintenance Due  Topic Date Due   Pneumonia Vaccine 2+ Years old (2 - PPSV23 if available, else PCV20) 04/18/2022    Colorectal cancer screening: Type of screening: Colonoscopy. Completed 01/13/21. Repeat every 3 years  Mammogram status: Completed 12/09/20. Repeat every year  Bone Density status: Completed 04/21/21. Results reflect: Bone density results: NORMAL. Repeat every 2 years.  Lung Cancer Screening: (Low Dose CT Chest recommended if Age 16-80 years, 30 pack-year currently smoking OR have quit w/in 15years.) does not qualify.    Additional Screening:  Hepatitis C Screening: does qualify; Completed   Vision Screening: Recommended annual ophthalmology exams for early detection of glaucoma and other disorders of the eye. Is the patient up to date with their annual eye exam?  Yes  Who is the provider or what is the name of the office in which the patient attends annual eye exams? Can not remember name or practice If pt is not established with a provider, would they like to be referred to a provider to establish care? No .   Dental Screening: Recommended annual dental exams for proper oral hygiene  Community Resource Referral / Chronic Care Management: CRR required this visit?  No   CCM required this visit?  No      Plan:     I have personally reviewed and noted the  following in the patients chart:   Medical and social history Use of alcohol, tobacco or illicit drugs  Current medications and supplements including opioid prescriptions.  Functional ability and status Nutritional status Physical activity Advanced directives List of other physicians Hospitalizations, surgeries, and ER visits in previous 12 months Vitals Screenings to include cognitive, depression, and falls Referrals and appointments  In addition, I have reviewed and discussed with patient certain preventive protocols, quality metrics, and best practice recommendations. A written personalized care plan for preventive services as well as general preventive health recommendations were provided to patient.     Sharon Seller, NP   01/29/2022    Virtual Visit via Telephone Note  I connected with patient 01/29/22 at  2:15 PM EDT by telephone and verified that I am speaking with the correct person using two identifiers.  Location: Patient: home Provider: [facility]   I discussed the limitations, risks, security and privacy concerns of performing an evaluation and management service by telephone and the availability of in person appointments. I also discussed with the patient that there may be a patient responsible charge related to this service. The patient expressed understanding and agreed to proceed.   I discussed the assessment and treatment plan with the patient. The patient was provided an opportunity to ask questions and all were answered. The patient agreed with the plan and demonstrated an understanding of the instructions.   The patient was advised to call back or seek an in-person  evaluation if the symptoms worsen or if the condition fails to improve as anticipated.  I provided 16 minutes of non-face-to-face time during this encounter.  [name]. [employer] printed and mailed

## 2022-01-29 NOTE — Patient Instructions (Signed)
Michele Page , ?Thank you for taking time to come for your Medicare Wellness Visit. I appreciate your ongoing commitment to your health goals. Please review the following plan we discussed and let me know if I can assist you in the future.  ? ?Screening recommendations/referrals: ?Colonoscopy up to date ?Mammogram up to date ?Bone Density up to date ?Recommended yearly ophthalmology/optometry visit for glaucoma screening and checkup ?Recommended yearly dental visit for hygiene and checkup ? ?Vaccinations: ?Influenza vaccine up to date ?Pneumococcal vaccine up to date ?Tdap vaccine up to date ?Shingles vaccine declined   ? ?Advanced directives: recommended to complete and so we can have on file.  ? ?Conditions/risks identified: advance age, obesity, hx of stroke  ? ?Next appointment: yearly  ? ? ?Preventive Care 66 Years and Older, Female ?Preventive care refers to lifestyle choices and visits with your health care provider that can promote health and wellness. ?What does preventive care include? ?A yearly physical exam. This is also called an annual well check. ?Dental exams once or twice a year. ?Routine eye exams. Ask your health care provider how often you should have your eyes checked. ?Personal lifestyle choices, including: ?Daily care of your teeth and gums. ?Regular physical activity. ?Eating a healthy diet. ?Avoiding tobacco and drug use. ?Limiting alcohol use. ?Practicing safe sex. ?Taking low-dose aspirin every day. ?Taking vitamin and mineral supplements as recommended by your health care provider. ?What happens during an annual well check? ?The services and screenings done by your health care provider during your annual well check will depend on your age, overall health, lifestyle risk factors, and family history of disease. ?Counseling  ?Your health care provider may ask you questions about your: ?Alcohol use. ?Tobacco use. ?Drug use. ?Emotional well-being. ?Home and relationship well-being. ?Sexual  activity. ?Eating habits. ?History of falls. ?Memory and ability to understand (cognition). ?Work and work Statistician. ?Reproductive health. ?Screening  ?You may have the following tests or measurements: ?Height, weight, and BMI. ?Blood pressure. ?Lipid and cholesterol levels. These may be checked every 5 years, or more frequently if you are over 58 years old. ?Skin check. ?Lung cancer screening. You may have this screening every year starting at age 45 if you have a 30-pack-year history of smoking and currently smoke or have quit within the past 15 years. ?Fecal occult blood test (FOBT) of the stool. You may have this test every year starting at age 59. ?Flexible sigmoidoscopy or colonoscopy. You may have a sigmoidoscopy every 5 years or a colonoscopy every 10 years starting at age 28. ?Hepatitis C blood test. ?Hepatitis B blood test. ?Sexually transmitted disease (STD) testing. ?Diabetes screening. This is done by checking your blood sugar (glucose) after you have not eaten for a while (fasting). You may have this done every 1-3 years. ?Bone density scan. This is done to screen for osteoporosis. You may have this done starting at age 74. ?Mammogram. This may be done every 1-2 years. Talk to your health care provider about how often you should have regular mammograms. ?Talk with your health care provider about your test results, treatment options, and if necessary, the need for more tests. ?Vaccines  ?Your health care provider may recommend certain vaccines, such as: ?Influenza vaccine. This is recommended every year. ?Tetanus, diphtheria, and acellular pertussis (Tdap, Td) vaccine. You may need a Td booster every 10 years. ?Zoster vaccine. You may need this after age 76. ?Pneumococcal 13-valent conjugate (PCV13) vaccine. One dose is recommended after age 73. ?Pneumococcal polysaccharide (PPSV23) vaccine. One  dose is recommended after age 42. ?Talk to your health care provider about which screenings and vaccines  you need and how often you need them. ?This information is not intended to replace advice given to you by your health care provider. Make sure you discuss any questions you have with your health care provider. ?Document Released: 11/29/2015 Document Revised: 07/22/2016 Document Reviewed: 09/03/2015 ?Elsevier Interactive Patient Education ? 2017 [place]. ? ?Fall Prevention in the Home ?Falls can cause injuries. They can happen to people of all ages. There are many things you can do to make your home safe and to help prevent falls. ?What can I do on the outside of my home? ?Regularly fix the edges of walkways and driveways and fix any cracks. ?Remove anything that might make you trip as you walk through a door, such as a raised step or threshold. ?Trim any bushes or trees on the path to your home. ?Use bright outdoor lighting. ?Clear any walking paths of anything that might make someone trip, such as rocks or tools. ?Regularly check to see if handrails are loose or broken. Make sure that both sides of any steps have handrails. ?Any raised decks and porches should have guardrails on the edges. ?Have any leaves, snow, or ice cleared regularly. ?Use sand or salt on walking paths during winter. ?Clean up any spills in your garage right away. This includes oil or grease spills. ?What can I do in the bathroom? ?Use night lights. ?Install grab bars by the toilet and in the tub and shower. Do not use towel bars as grab bars. ?Use non-skid mats or decals in the tub or shower. ?If you need to sit down in the shower, use a plastic, non-slip stool. ?Keep the floor dry. Clean up any water that spills on the floor as soon as it happens. ?Remove soap buildup in the tub or shower regularly. ?Attach bath mats securely with double-sided non-slip rug tape. ?Do not have throw rugs and other things on the floor that can make you trip. ?What can I do in the bedroom? ?Use night lights. ?Make sure that you have a light by your bed that  is easy to reach. ?Do not use any sheets or blankets that are too big for your bed. They should not hang down onto the floor. ?Have a firm chair that has side arms. You can use this for support while you get dressed. ?Do not have throw rugs and other things on the floor that can make you trip. ?What can I do in the kitchen? ?Clean up any spills right away. ?Avoid walking on wet floors. ?Keep items that you use a lot in easy-to-reach places. ?If you need to reach something above you, use a strong step stool that has a grab bar. ?Keep electrical cords out of the way. ?Do not use floor polish or wax that makes floors slippery. If you must use wax, use non-skid floor wax. ?Do not have throw rugs and other things on the floor that can make you trip. ?What can I do with my stairs? ?Do not leave any items on the stairs. ?Make sure that there are handrails on both sides of the stairs and use them. Fix handrails that are broken or loose. Make sure that handrails are as long as the stairways. ?Check any carpeting to make sure that it is firmly attached to the stairs. Fix any carpet that is loose or worn. ?Avoid having throw rugs at the top or bottom of the  stairs. If you do have throw rugs, attach them to the floor with carpet tape. ?Make sure that you have a light switch at the top of the stairs and the bottom of the stairs. If you do not have them, ask someone to add them for you. ?What else can I do to help prevent falls? ?Wear shoes that: ?Do not have high heels. ?Have rubber bottoms. ?Are comfortable and fit you well. ?Are closed at the toe. Do not wear sandals. ?If you use a stepladder: ?Make sure that it is fully opened. Do not climb a closed stepladder. ?Make sure that both sides of the stepladder are locked into place. ?Ask someone to hold it for you, if possible. ?Clearly mark and make sure that you can see: ?Any grab bars or handrails. ?First and last steps. ?Where the edge of each step is. ?Use tools that help you  move around (mobility aids) if they are needed. These include: ?Canes. ?Walkers. ?Scooters. ?Crutches. ?Turn on the lights when you go into a dark area. Replace any light bulbs as soon as they burn out. ?Set

## 2022-01-29 NOTE — Telephone Encounter (Signed)
[name], Michele Page are scheduled for a virtual visit with your provider today.   ? ?Just as we do with appointments in the office, we must obtain your consent to participate.  Your consent will be active for this visit and any virtual visit you may have with one of our providers in the next 365 days.   ? ?If you have a MyChart account, I can also send a copy of this consent to you electronically.  All virtual visits are billed to your insurance company just like a traditional visit in the office.  As this is a virtual visit, video technology does not allow for your provider to perform a traditional examination.  This may limit your provider's ability to fully assess your condition.  If your provider identifies any concerns that need to be evaluated in person or the need to arrange testing such as labs, EKG, etc, we will make arrangements to do so.   ? ?Although advances in technology are sophisticated, we cannot ensure that it will always work on either your end or our end.  If the connection with a video visit is poor, we may have to switch to a telephone visit.  With either a video or telephone visit, we are not always able to ensure that we have a secure connection.   I need to obtain your verbal consent now.   Are you willing to proceed with your visit today?  ? ?Michele Page has provided verbal consent on 01/29/2022 for a virtual visit (video or telephone). ? ? ?Carroll Kinds, CMA ?01/29/2022  2:21 PM ?  ?

## 2022-01-30 ENCOUNTER — Inpatient Hospital Stay: Payer: Medicare HMO | Admitting: Hematology and Oncology

## 2022-01-30 ENCOUNTER — Inpatient Hospital Stay: Payer: Medicare HMO

## 2022-01-30 ENCOUNTER — Encounter: Payer: Self-pay | Admitting: *Deleted

## 2022-01-30 ENCOUNTER — Inpatient Hospital Stay: Payer: Medicare HMO | Attending: Hematology and Oncology

## 2022-01-30 DIAGNOSIS — C50411 Malignant neoplasm of upper-outer quadrant of right female breast: Secondary | ICD-10-CM | POA: Diagnosis not present

## 2022-01-30 DIAGNOSIS — Z170 Estrogen receptor positive status [ER+]: Secondary | ICD-10-CM | POA: Insufficient documentation

## 2022-01-30 DIAGNOSIS — Z95828 Presence of other vascular implants and grafts: Secondary | ICD-10-CM

## 2022-01-30 DIAGNOSIS — Z5111 Encounter for antineoplastic chemotherapy: Secondary | ICD-10-CM | POA: Insufficient documentation

## 2022-01-30 DIAGNOSIS — Z79899 Other long term (current) drug therapy: Secondary | ICD-10-CM | POA: Diagnosis not present

## 2022-01-30 LAB — CMP (CANCER CENTER ONLY)
ALT: 16 U/L (ref 0–44)
AST: 18 U/L (ref 15–41)
Albumin: 4.4 g/dL (ref 3.5–5.0)
Alkaline Phosphatase: 100 U/L (ref 38–126)
Anion gap: 7 (ref 5–15)
BUN: 24 mg/dL — ABNORMAL HIGH (ref 8–23)
CO2: 28 mmol/L (ref 22–32)
Calcium: 9.6 mg/dL (ref 8.9–10.3)
Chloride: 106 mmol/L (ref 98–111)
Creatinine: 1.8 mg/dL — ABNORMAL HIGH (ref 0.44–1.00)
GFR, Estimated: 31 mL/min — ABNORMAL LOW (ref >60)
Glucose, Bld: 150 mg/dL — ABNORMAL HIGH (ref 70–99)
Potassium: 3.5 mmol/L (ref 3.5–5.1)
Sodium: 141 mmol/L (ref 135–145)
Total Bilirubin: 0.4 mg/dL (ref 0.3–1.2)
Total Protein: 7.1 g/dL (ref 6.5–8.1)

## 2022-01-30 LAB — CBC WITH DIFFERENTIAL (CANCER CENTER ONLY)
Abs Immature Granulocytes: 0.04 10*3/uL (ref 0.00–0.07)
Basophils Absolute: 0.1 10*3/uL (ref 0.0–0.1)
Basophils Relative: 1 %
Eosinophils Absolute: 0.2 10*3/uL (ref 0.0–0.5)
Eosinophils Relative: 6 %
HCT: 33.3 % — ABNORMAL LOW (ref 36.0–46.0)
Hemoglobin: 11.4 g/dL — ABNORMAL LOW (ref 12.0–15.0)
Immature Granulocytes: 1 %
Lymphocytes Relative: 29 %
Lymphs Abs: 1.1 10*3/uL (ref 0.7–4.0)
MCH: 31.6 pg (ref 26.0–34.0)
MCHC: 34.2 g/dL (ref 30.0–36.0)
MCV: 92.2 fL (ref 80.0–100.0)
Monocytes Absolute: 0.6 10*3/uL (ref 0.1–1.0)
Monocytes Relative: 15 %
Neutro Abs: 1.8 10*3/uL (ref 1.7–7.7)
Neutrophils Relative %: 48 %
Platelet Count: 326 10*3/uL (ref 150–400)
RBC: 3.61 MIL/uL — ABNORMAL LOW (ref 3.87–5.11)
RDW: 13.4 % (ref 11.5–15.5)
WBC Count: 3.8 10*3/uL — ABNORMAL LOW (ref 4.0–10.5)
nRBC: 0 % (ref 0.0–0.2)

## 2022-01-30 MED ORDER — SODIUM CHLORIDE 0.9 % IV SOLN
500.0000 mg/m2 | Freq: Once | INTRAVENOUS | Status: AC
  Administered 2022-01-30: 1000 mg via INTRAVENOUS
  Filled 2022-01-30: qty 50

## 2022-01-30 MED ORDER — SODIUM CHLORIDE 0.9 % IV SOLN: Freq: Once | INTRAVENOUS | Status: AC

## 2022-01-30 MED ORDER — SODIUM CHLORIDE 0.9% FLUSH
10.0000 mL | INTRAVENOUS | Status: DC | PRN
  Administered 2022-01-30: 10 mL

## 2022-01-30 MED ORDER — HEPARIN SOD (PORK) LOCK FLUSH 100 UNIT/ML IV SOLN
500.0000 [IU] | Freq: Once | INTRAVENOUS | Status: AC | PRN
  Administered 2022-01-30: 500 [IU]

## 2022-01-30 MED ORDER — SODIUM CHLORIDE 0.9% FLUSH
10.0000 mL | Freq: Once | INTRAVENOUS | Status: AC
  Administered 2022-01-30: 10 mL

## 2022-01-30 MED ORDER — SODIUM CHLORIDE 0.9 % IV SOLN
10.0000 mg | Freq: Once | INTRAVENOUS | Status: AC
  Administered 2022-01-30: 10 mg via INTRAVENOUS
  Filled 2022-01-30: qty 10

## 2022-01-30 MED ORDER — METHOTREXATE SODIUM (PF) CHEMO INJECTION 250 MG/10ML
30.0000 mg/m2 | Freq: Once | INTRAMUSCULAR | Status: AC
  Administered 2022-01-30: 60 mg via INTRAVENOUS
  Filled 2022-01-30: qty 2.4

## 2022-01-30 MED ORDER — FLUOROURACIL CHEMO INJECTION 2.5 GM/50ML
400.0000 mg/m2 | Freq: Once | INTRAVENOUS | Status: AC
  Administered 2022-01-30: 800 mg via INTRAVENOUS
  Filled 2022-01-30: qty 16

## 2022-01-30 MED ORDER — PALONOSETRON HCL INJECTION 0.25 MG/5ML
0.2500 mg | Freq: Once | INTRAVENOUS | Status: AC
  Administered 2022-01-30: 0.25 mg via INTRAVENOUS
  Filled 2022-01-30: qty 5

## 2022-01-30 NOTE — Progress Notes (Signed)
Scr 1.8, CrCl 43. Continue same Methotrexate dose per MD. ? ?Raul Del La Tierra, RPH, BCPS, BCOP ?01/30/2022 ?2:21 PM ? ?

## 2022-01-30 NOTE — Patient Instructions (Signed)
Michele Page   ?Discharge Instructions: ?Thank you for choosing [facility] to provide your oncology and hematology care.  ? ?If you have a lab appointment with the [facility], please go directly to the [facility] and check in at the registration area. ?  ?Wear comfortable clothing and clothing appropriate for easy access to any Portacath or PICC line.  ? ?We strive to give you quality time with your provider. You may need to reschedule your appointment if you arrive late (15 or more minutes).  Arriving late affects you and other patients whose appointments are after yours.  Also, if you miss three or more appointments without notifying the office, you may be dismissed from the clinic at the provider?s discretion.    ?  ?For prescription refill requests, have your pharmacy contact our office and allow 72 hours for refills to be completed.   ? ?Today you received the following chemotherapy and/or immunotherapy agents: cyclophosphamide, methotrexate, fluorouracil    ?  ?To help prevent nausea and vomiting after your treatment, we encourage you to take your nausea medication as directed. ? ?BELOW ARE SYMPTOMS THAT SHOULD BE REPORTED IMMEDIATELY: ?*FEVER GREATER THAN 100.4 F (38 ?C) OR HIGHER ?*CHILLS OR SWEATING ?*NAUSEA AND VOMITING THAT IS NOT CONTROLLED WITH YOUR NAUSEA MEDICATION ?*UNUSUAL SHORTNESS OF BREATH ?*UNUSUAL BRUISING OR BLEEDING ?*URINARY PROBLEMS (pain or burning when urinating, or frequent urination) ?*BOWEL PROBLEMS (unusual diarrhea, constipation, pain near the anus) ?TENDERNESS IN MOUTH AND THROAT WITH OR WITHOUT PRESENCE OF ULCERS (sore throat, sores in mouth, or a toothache) ?UNUSUAL RASH, SWELLING OR PAIN  ?UNUSUAL VAGINAL DISCHARGE OR ITCHING  ? ?Items with * indicate a potential emergency and should be followed up as soon as possible or go to the Emergency Department if any problems should occur. ? ?Please show the CHEMOTHERAPY ALERT CARD or  IMMUNOTHERAPY ALERT CARD at check-in to the Emergency Department and triage nurse. ? ?Should you have questions after your visit or need to cancel or reschedule your appointment, please contact [facility]  Dept: [phone]  and follow the prompts.  Office hours are 8:00 a.m. to 4:30 p.m. Monday - Friday. Please note that voicemails left after 4:00 p.m. may not be returned until the following business day.  We are closed weekends and major holidays. You have access to a nurse at all times for urgent questions. Please call the main number to the clinic Dept: [phone] and follow the prompts. ? ? ?For any non-urgent questions, you may also contact your provider using MyChart. We now offer e-Visits for anyone 25 and older to request care online for non-urgent symptoms. For details visit [email or web address]. ?  ?Also download the MyChart app! Go to the app store, search "MyChart", open the app, select [facility], and log in with your MyChart username and password. ? ?Due to Covid, a mask is required upon entering the hospital/clinic. If you do not have a mask, one will be given to you upon arrival. For doctor visits, patients may have 1 support person aged 41 or older with them. For treatment visits, patients cannot have anyone with them due to current Covid guidelines and our immunocompromised population.  ? ?

## 2022-01-30 NOTE — Progress Notes (Signed)
Per MD okay to treat today with Crt. 1.8 ?

## 2022-01-30 NOTE — Assessment & Plan Note (Signed)
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, 1.3 cm, with DCIS, margins negative, 0/4 lymph nodes negative, T1 cN0 stage IA pathologic stage, ER 100%, PR 100%, HER-2 negative ratio 1.39, Ki-67 20% ?(Originally 2 nodules were detected on screening mammogram 1.9 cm= fat necrosis and 8 mm IDC) ?Oncotype DX score 20, 13% risk of recurrence, intermediate risk ?Adjuvant radiation therapy started 07/20/2017completed 07/13/2016 ?? ?? ?09/02/2021:Right lumpectomy: Grade 2 ILC 3.1 cm, margins negative, LVI present, 4/6 lymph nodes macrometastases, 2 lymph nodes isolated tumor cells, ER 100%, PR 90%, Ki-67 25%, HER2 negative ?? ?Treatment plan: ?1.??Based on for enlarged lymph nodes, recommended systemic chemotherapy with CMF x6?cycles ?2.?adjuvant radiation therapy ?3.??For the continued antiestrogen therapy (previously anastrozole started 07/23/2016 switched to letrozole 2018 switched to tamoxifen 2018, switched back to anastrozole) hair loss was the main problem, may consider exemestane ?------------------------------------------------------------------------------------------------------------------------------- ?10/15/2021: CT CAP: No metastatic disease. ?Descending and sigmoid colon diverticulosis, several tiny pulmonary nodules less than 5 mm, 2.1 cm left thyroid nodule ?? ?Current treatment: Cycle?6?CMF ?? ?Chemo Toxicities:?Tolerating chemotherapy extremely well without any side effects. ?Leukopenia: Reduced?the dosage of chemotherapy for cycle 2. ?Leukopenia: ANC 0.9 today. ?Proceed with today's treatment. ?? ?RTC after radiation is complete ?

## 2022-02-10 ENCOUNTER — Other Ambulatory Visit: Payer: Self-pay | Admitting: Nurse Practitioner

## 2022-02-10 DIAGNOSIS — I10 Essential (primary) hypertension: Secondary | ICD-10-CM

## 2022-02-17 ENCOUNTER — Other Ambulatory Visit: Payer: Self-pay

## 2022-02-17 ENCOUNTER — Encounter: Payer: Self-pay | Admitting: Radiation Oncology

## 2022-02-17 ENCOUNTER — Ambulatory Visit
Admission: RE | Admit: 2022-02-17 | Discharge: 2022-02-17 | Disposition: A | Discharge status: Home / Self Care | Payer: Medicare HMO | Source: Ambulatory Visit | Attending: Radiation Oncology | Admitting: Radiation Oncology

## 2022-02-17 DIAGNOSIS — Z510 Encounter for antineoplastic radiation therapy: Secondary | ICD-10-CM | POA: Diagnosis not present

## 2022-02-17 DIAGNOSIS — C50411 Malignant neoplasm of upper-outer quadrant of right female breast: Secondary | ICD-10-CM | POA: Insufficient documentation

## 2022-02-17 DIAGNOSIS — C50412 Malignant neoplasm of upper-outer quadrant of left female breast: Secondary | ICD-10-CM | POA: Diagnosis not present

## 2022-02-17 DIAGNOSIS — Z170 Estrogen receptor positive status [ER+]: Secondary | ICD-10-CM | POA: Insufficient documentation

## 2022-02-17 NOTE — Progress Notes (Signed)
Pt completed chemotherapy on 01/30/22, and is interested in her PAC being removed. I encouraged our staff to communicate that we would recommend it remain in place until after radiation because removal would involve an additional 2 week delay prior to being able to proceed. If this is bothering her moreso, she will contact Dr. Brantley Stage. ?

## 2022-02-19 ENCOUNTER — Telehealth: Payer: Medicare HMO | Admitting: Nurse Practitioner

## 2022-02-19 DIAGNOSIS — N1832 Chronic kidney disease, stage 3b: Secondary | ICD-10-CM

## 2022-02-19 DIAGNOSIS — Z6837 Body mass index (BMI) 37.0-37.9, adult: Secondary | ICD-10-CM

## 2022-02-19 DIAGNOSIS — C50412 Malignant neoplasm of upper-outer quadrant of left female breast: Secondary | ICD-10-CM | POA: Diagnosis not present

## 2022-02-19 DIAGNOSIS — Z170 Estrogen receptor positive status [ER+]: Secondary | ICD-10-CM | POA: Diagnosis not present

## 2022-02-19 DIAGNOSIS — C50411 Malignant neoplasm of upper-outer quadrant of right female breast: Secondary | ICD-10-CM | POA: Diagnosis not present

## 2022-02-19 DIAGNOSIS — Z510 Encounter for antineoplastic radiation therapy: Secondary | ICD-10-CM | POA: Diagnosis not present

## 2022-02-19 NOTE — Progress Notes (Signed)
? ? ?Careteam: ?Patient Care Team: ?[name], NP as PCP - General (Geriatric Medicine) ?Lorretta Harp, MD as PCP - Cardiology (Cardiology) ?Nicholas Lose, MD as Consulting Physician (Oncology) ?Erroll Luna, MD as Consulting Physician (General Surgery) ? ?Advanced Directive information ?  ? ?No Known Allergies ? ?Chief Complaint  ?Patient presents with  ? Acute Visit  ?  Patient is requesting "water pill". Patient is not having any swelling or pain. Patient would like pill to lose weight. Patient states that weight gained is being caused by water.  ? ? ? ?HPI: Patient is a 66 y.o. female to request water pill. ?Reports she wants to lose weight with a water pill and to go to the bathroom more.  ?No lower leg swelling. ? ?States she does not eat junk and has decreased the amount of food she has taken.  ?She does not know if she is gaining weight from the chemo  ?She has completed chemo. Next is radiation.  ? ?Current weight of 199 lbs which is down over the last few months.  ? ?Reports she is very active at work, 4 hours daily in the morning. Reports she is using her arms more.  ? ?Review of Systems:  ?Review of Systems  ?Constitutional:  Negative for chills, fever and weight loss.  ?HENT:  Negative for tinnitus.   ?Respiratory:  Negative for cough, sputum production and shortness of breath.   ?Cardiovascular:  Negative for chest pain, palpitations and leg swelling.  ?Gastrointestinal:  Negative for abdominal pain, constipation, diarrhea and heartburn.  ?Genitourinary:  Negative for dysuria, frequency and urgency.  ?Skin: Negative.   ?Neurological:  Negative for dizziness and headaches.  ?Psychiatric/Behavioral:  Negative for depression and memory loss. The patient does not have insomnia.   ? ?Past Medical History:  ?Diagnosis Date  ? Anemia   ? past hx many yrs ago   ? Arthritis   ? lt knee  ? Breast cancer ([place])   ? Breast cancer of upper-outer quadrant of left female breast ([place]) 04/02/2016  ?  Chronic kidney disease   ? CKD  ? Colon polyps   ? COPD (chronic obstructive pulmonary disease) ([place])   ? GERD (gastroesophageal reflux disease)   ? years ago- not current   ? Hyperlipidemia   ? Hypertension   ? Multiple sclerosis ([place])   ? Personal history of chemotherapy   ? Personal history of radiation therapy   ? Stroke [facility])   ? 2013, mild cognitive deficits  ? Vision abnormalities   ? ?Past Surgical History:  ?Procedure Laterality Date  ? BREAST BIOPSY Left 03/31/2016  ? BREAST BIOPSY Right 07/24/2021  ? BREAST LUMPECTOMY Left 04/17/2016  ? BREAST LUMPECTOMY WITH RADIOACTIVE SEED AND SENTINEL LYMPH NODE BIOPSY Right 09/02/2021  ? Procedure: RIGHT BREAST LUMPECTOMY WITH RADIOACTIVE SEED AND SENTINEL LYMPH NODE BIOPSY;  Surgeon: Erroll Luna, MD;  Location: [facility];  Service: General;  Laterality: Right;  ? COLONOSCOPY    ? HYSTEROSCOPY WITH D & C N/A 12/19/2015  ? Procedure: DILATATION AND CURETTAGE /HYSTEROSCOPY;  Surgeon: Linda Hedges, DO;  Location: [facility];  Service: Gynecology;  Laterality: N/A;  ? POLYPECTOMY    ? PORTACATH PLACEMENT Right 10/16/2021  ? Procedure: INSERTION PORT-A-CATH;  Surgeon: Erroll Luna, MD;  Location: [facility];  Service: General;  Laterality: Right;  ? RADIOACTIVE SEED GUIDED PARTIAL MASTECTOMY WITH AXILLARY SENTINEL LYMPH NODE BIOPSY Left 04/17/2016  ? Procedure: RADIOACTIVE SEED GUIDED PARTIAL MASTECTOMY WITH AXILLARY  SENTINEL LYMPH NODE BIOPSY;  Surgeon: Erroll Luna, MD;  Location: [facility];  Service: General;  Laterality: Left;  RADIOACTIVE SEED GUIDED PARTIAL MASTECTOMY WITH AXILLARY SENTINEL LYMPH NODE BIOPSY   ? TUBAL LIGATION    ? VAGINAL DELIVERY    ? x3  ? ?Social History: ?  reports that she quit smoking about 17 years ago. Her smoking use included cigarettes. She has a 12.50 pack-year smoking history. She has never used smokeless tobacco. She reports that she does not currently use alcohol. She reports that she  does not use drugs. ? ?Family History  ?Problem Relation Age of Onset  ? Stroke Sister   ? Hypertension Mother   ? Stroke Mother   ? Alcohol abuse Brother   ? HIV/AIDS Brother   ? Colon cancer Neg Hx   ? Colon polyps Neg Hx   ? Esophageal cancer Neg Hx   ? Rectal cancer Neg Hx   ? Stomach cancer Neg Hx   ? ? ?Medications: ?Patient's Medications  ?New Prescriptions  ? No medications on file  ?Previous Medications  ? ASPIRIN EC 81 MG TABLET    Take 1 tablet (81 mg total) by mouth daily.  ? ATORVASTATIN (LIPITOR) 20 MG TABLET    TAKE 1 TABLET EVERY DAY  ? ESZOPICLONE (LUNESTA) 2 MG TABS TABLET    Take one tablet by mouth at bedtime as needed for sleep (take immediately before bedtime)  ? LABETALOL (NORMODYNE) 300 MG TABLET    TAKE 2 TABLETS THREE TIMES DAILY  ? NIFEDIPINE (PROCARDIA XL/NIFEDICAL XL) 60 MG 24 HR TABLET    Take 1 tablet (60 mg total) by mouth 2 (two) times daily.  ?Modified Medications  ? No medications on file  ?Discontinued Medications  ? ANASTROZOLE (ARIMIDEX) 1 MG TABLET    Take 1 mg by mouth daily.  ? ? ?Physical Exam: ? ?There were no vitals filed for this visit. ?There is no height or weight on file to calculate BMI. ?Wt Readings from Last 3 Encounters:  ?01/30/22 199 lb 6.4 oz (90.4 kg)  ?01/20/22 203 lb 9.6 oz (92.4 kg)  ?01/08/22 207 lb (93.9 kg)  ? ? ?Physical Exam ?Constitutional:   ?   Appearance: Normal appearance.  ?Pulmonary:  ?   Effort: Pulmonary effort is normal.  ?Neurological:  ?   Mental Status: She is alert. Mental status is at baseline.  ?Psychiatric:     ?   Mood and Affect: Mood normal.  ? ? ?Labs reviewed: ?Basic Metabolic Panel: ?Recent Labs  ?  12/18/21 ?1315 01/08/22 ?1305 01/30/22 ?1134  ?NA 140 141 141  ?K 4.0 3.6 3.5  ?CL 107 107 106  ?CO2 '28 29 28  '$ ?GLUCOSE 140* 162* 150*  ?BUN 19 23 24*  ?CREATININE 1.24* 1.48* 1.80*  ?CALCIUM 9.4 9.4 9.6  ? ?Liver Function Tests: ?Recent Labs  ?  12/18/21 ?1315 01/08/22 ?1305 01/30/22 ?1134  ?AST '24 26 18  '$ ?ALT '23 25 16  '$ ?ALKPHOS 99 90  100  ?BILITOT 0.3 0.5 0.4  ?PROT 6.8 6.7 7.1  ?ALBUMIN 4.3 4.3 4.4  ? ?No results for input(s): LIPASE, AMYLASE in the last 8760 hours. ?No results for input(s): AMMONIA in the last 8760 hours. ?CBC: ?Recent Labs  ?  12/18/21 ?1315 01/08/22 ?1305 01/30/22 ?1134  ?WBC 3.1* 3.0* 3.8*  ?NEUTROABS 1.1* 0.9* 1.8  ?HGB 11.0* 10.9* 11.4*  ?HCT 32.6* 32.5* 33.3*  ?MCV 92.9 92.9 92.2  ?PLT 309 315 326  ? ?Lipid  Panel: ?Recent Labs  ?  04/18/21 ?1348  ?CHOL 139  ?HDL 66  ?Sidney 58  ?TRIG 73  ?CHOLHDL 2.1  ? ?TSH: ?No results for input(s): TSH in the last 8760 hours. ?A1C: ?Lab Results  ?Component Value Date  ? HGBA1C 5.8 (H) 05/13/2020  ? ? ? ?Assessment/Plan ?1. Stage 3b chronic kidney disease (CKD) ([place]) ?-worsening of renal function over the last several months noted.  ?Encourage proper hydration ?Follow metabolic panel ?Avoid nephrotoxic meds (NSAIDS) ? ?2. Class 2 severe obesity due to excess calories with serious comorbidity and body mass index (BMI) of 37.0 to 37.9 in adult [facility]) ?-encouraged proper dietary modifications with increase in physical activity ?-likely needing more education ?- Amb ref to Medical Nutrition Therapy-MNT ? ?Carlos American. Dewaine Oats, AGNP ? ?[facility] ?[phone]  ? ? ?Virtual Visit via mychart  ? ?I connected with patient on 02/19/22 at  2:20 PM EDT by video and verified that I am speaking with the correct person using two identifiers. ? ?Location: ?Patient: home ?Provider: [facility]  ?  ?I discussed the limitations, risks, security and privacy concerns of performing an evaluation and management service by telephone and the availability of in person appointments. I also discussed with the patient that there may be a patient responsible charge related to this service. The patient expressed understanding and agreed to proceed. ? ? ?I discussed the assessment and treatment plan with the patient. The patient was provided an opportunity to ask questions and all were  answered. The patient agreed with the plan and demonstrated an understanding of the instructions. ?  ?The patient was advised to call back or seek an in-person evaluation if the symptoms worsen or if the con

## 2022-02-23 ENCOUNTER — Inpatient Hospital Stay: Payer: Medicare HMO | Attending: Hematology and Oncology | Admitting: Licensed Clinical Social Worker

## 2022-02-23 ENCOUNTER — Encounter: Payer: Self-pay | Admitting: Licensed Clinical Social Worker

## 2022-02-23 ENCOUNTER — Other Ambulatory Visit: Payer: Self-pay

## 2022-02-23 ENCOUNTER — Inpatient Hospital Stay: Payer: Medicare HMO

## 2022-02-23 ENCOUNTER — Other Ambulatory Visit: Payer: Self-pay | Admitting: Licensed Clinical Social Worker

## 2022-02-23 DIAGNOSIS — Z853 Personal history of malignant neoplasm of breast: Secondary | ICD-10-CM | POA: Diagnosis not present

## 2022-02-23 DIAGNOSIS — Z170 Estrogen receptor positive status [ER+]: Secondary | ICD-10-CM

## 2022-02-23 DIAGNOSIS — C50411 Malignant neoplasm of upper-outer quadrant of right female breast: Secondary | ICD-10-CM

## 2022-02-23 DIAGNOSIS — C50412 Malignant neoplasm of upper-outer quadrant of left female breast: Secondary | ICD-10-CM

## 2022-02-23 DIAGNOSIS — Z803 Family history of malignant neoplasm of breast: Secondary | ICD-10-CM

## 2022-02-23 LAB — GENETIC SCREENING ORDER

## 2022-02-23 NOTE — Progress Notes (Signed)
REFERRING PROVIDER: ?Nicholas Lose, MD ?[facility] ?[place],  [place] [address] ? ?PRIMARY PROVIDER:  ?[name], NP ? ?PRIMARY REASON FOR VISIT:  ?1. Malignant neoplasm of upper-outer quadrant of left breast in female, estrogen receptor positive ([place])   ?2. Malignant neoplasm of upper-outer quadrant of right breast in female, estrogen receptor positive ([place])   ? ? ? ?HISTORY OF PRESENT ILLNESS:   ?Michele Page, a 66 y.o. female, was seen for a [facility] cancer genetics consultation at the request of Dr. Lindi Adie due to a personal history of breast cancer.  Michele Page presents to clinic today to discuss the possibility of a hereditary predisposition to cancer, genetic testing, and to further clarify her future cancer risks, as well as potential cancer risks for family members.  ? ?In 2017, at the age of 39, Michele Page was diagnosed with IDC of the left breast, ER/PR+ HER2-. The treatment plan included left lumpectomy, adjuvant radiation and antiestrogens.  ?In 2022, at the age of 45, Michele Page was diagnosed with ILC of the right breast, ER/PR+, HER2-. The treatment plan includes right lumpectomy (completed) and adjuvant chemotherapy.  ? ? ?CANCER HISTORY:  ?Oncology History  ?Breast cancer of upper-outer quadrant of left female breast ([place])  ?03/31/2016 Initial Diagnosis  ? Screening detected left breast mass, 2 nodules, 1.9 x 1.6 x 0.8 cm= fat necrosis; 8 x 7 x 7 mm= grade 2 IDC ER 100%, PR 100%, HER-2 negative ratio 1.39, Ki-67 20% ?  ?04/17/2016 Surgery  ? Left lumpectomy ([name]): IDC grade 2, 1.3 cm, with DCIS, margins negative, 0/4 lymph nodes negative, T1 cN0 stage IA pathologic stage, ER 100%, PR 100%, HER-2 negative ratio 1.39, Ki-67 20% Oncotype DX score 20, 13% ROR, intermediate risk ?  ?04/17/2016 Oncotype testing  ? Recurrence score: 20; ROR 15% (intermediate risk)  ?  ?05/27/2016 - 07/13/2016 Radiation Therapy  ? Adjuvant radiation therapy [facility]): Left breast treated with breath hold to 50.4  Gy in 28 fractions at 1.8 Gy/fraction.  Left breast boosted to 10 Gy in 5 fractions at 2 Gy/fraction ?  ?07/23/2016 -  Anti-estrogen oral therapy  ? Anastrozole 1 mg switched to letrozole 04/22/2017 due to hair loss, switch to tamoxifen 10/22/2017 due to hair loss from letrozole as well ?  ?Malignant neoplasm of upper-outer quadrant of right breast in female, estrogen receptor positive ([place])  ?08/04/2021 Initial Diagnosis  ? Malignant neoplasm of upper-outer quadrant of right breast in female, estrogen receptor positive ([place]) ?  ? Relapse/Recurrence  ? 2.2 cm ILC ER 100%, PR 90%, Her 2 Neg, Ki 67: 25%, Size 2.5 cm ?  ?08/04/2021 Cancer Staging  ? Staging form: Breast, AJCC 8th Edition ?- Clinical: Stage IB (cT2, cN0, cM0, G2, ER+, PR+, HER2-) - Signed by Nicholas Lose, MD on 08/04/2021 ?Stage prefix: Initial diagnosis ?Histologic grading system: 3 grade system ? ?  ?09/02/2021 Surgery  ? Right lumpectomy: Grade 2 ILC 3.1 cm, margins negative, LVI present, 4/6 lymph nodes macrometastases, 2 lymph nodes isolated tumor cells, ER 100%, PR 90%, Ki-67 25%, HER2 negative ?  ?10/17/2021 -  Chemotherapy  ? Patient is on Treatment Plan : BREAST Adjuvant CMF IV q21d  ?   ? ? ? ?RISK FACTORS:  ?Menarche was at age 33.  ?First live birth at age 54.  ?OCP use for approximately 2 years.  ?Ovaries intact: yes.  ?Hysterectomy: no.  ?Menopausal status: postmenopausal.  ?HRT use: 0 years. ?Colonoscopy: yes; normal. ?Mammogram within the last year: yes. ?  Number of breast biopsies: 2. ?Up to date with pelvic exams: yes. ? ?Past Medical History:  ?Diagnosis Date  ? Anemia   ? past hx many yrs ago   ? Arthritis   ? lt knee  ? Breast cancer ([place])   ? Breast cancer of upper-outer quadrant of left female breast ([place]) 04/02/2016  ? Chronic kidney disease   ? CKD  ? Colon polyps   ? COPD (chronic obstructive pulmonary disease) ([place])   ? GERD (gastroesophageal reflux disease)   ? years ago- not current   ? Hyperlipidemia   ? Hypertension   ? Multiple  sclerosis ([place])   ? Personal history of chemotherapy   ? Personal history of radiation therapy   ? Stroke [facility])   ? 2013, mild cognitive deficits  ? Vision abnormalities   ? ? ?Past Surgical History:  ?Procedure Laterality Date  ? BREAST BIOPSY Left 03/31/2016  ? BREAST BIOPSY Right 07/24/2021  ? BREAST LUMPECTOMY Left 04/17/2016  ? BREAST LUMPECTOMY WITH RADIOACTIVE SEED AND SENTINEL LYMPH NODE BIOPSY Right 09/02/2021  ? Procedure: RIGHT BREAST LUMPECTOMY WITH RADIOACTIVE SEED AND SENTINEL LYMPH NODE BIOPSY;  Surgeon: Erroll Luna, MD;  Location: [facility];  Service: General;  Laterality: Right;  ? COLONOSCOPY    ? HYSTEROSCOPY WITH D & C N/A 12/19/2015  ? Procedure: DILATATION AND CURETTAGE /HYSTEROSCOPY;  Surgeon: Linda Hedges, DO;  Location: [facility];  Service: Gynecology;  Laterality: N/A;  ? POLYPECTOMY    ? PORTACATH PLACEMENT Right 10/16/2021  ? Procedure: INSERTION PORT-A-CATH;  Surgeon: Erroll Luna, MD;  Location: [facility];  Service: General;  Laterality: Right;  ? RADIOACTIVE SEED GUIDED PARTIAL MASTECTOMY WITH AXILLARY SENTINEL LYMPH NODE BIOPSY Left 04/17/2016  ? Procedure: RADIOACTIVE SEED GUIDED PARTIAL MASTECTOMY WITH AXILLARY SENTINEL LYMPH NODE BIOPSY;  Surgeon: Erroll Luna, MD;  Location: [facility];  Service: General;  Laterality: Left;  RADIOACTIVE SEED GUIDED PARTIAL MASTECTOMY WITH AXILLARY SENTINEL LYMPH NODE BIOPSY   ? TUBAL LIGATION    ? VAGINAL DELIVERY    ? x3  ? ? ?Social History  ? ?Socioeconomic History  ? Marital status: Married  ?  Spouse name: Not on file  ? Number of children: 3  ? Years of education: Not on file  ? Highest education level: Not on file  ?Occupational History  ? Occupation: retired  ?Tobacco Use  ? Smoking status: Former  ?  Packs/day: 0.50  ?  Years: 25.00  ?  Pack years: 12.50  ?  Types: Cigarettes  ?  Quit date: 11/16/2004  ?  Years since quitting: 17.2  ? Smokeless tobacco: Never  ?Vaping Use  ? Vaping Use:  Never used  ?Substance and Sexual Activity  ? Alcohol use: Not Currently  ?  Comment: occas  ? Drug use: No  ? Sexual activity: Yes  ?  Comment: 1st intercourse 10 yo-5 partners  ?Other Topics Concern  ? Not on file  ?Social History Narrative  ? Diet- N/A  ? Caffeine- Yes  ? Married- Yes  ? House- 2 story with 2 people  ? Pets- No  ? Current/past profession- Insurance underwriter daycare  ? Exercise- Yes  ? Living will-No  ? DNR-N/A  ? POA/HPOA-No  ?   ? Works part-time at [employer]  ? ?Social Determinants of Health  ? ?Financial Resource Strain: Not on file  ?Food Insecurity: Not on file  ?Transportation Needs: Not on file  ?Physical Activity: Not on file  ?  Stress: Not on file  ?Social Connections: Not on file  ?  ? ?FAMILY HISTORY:  ?We obtained a detailed, 4-generation family history.  Significant diagnoses are listed below: ?Family History  ?Problem Relation Age of Onset  ? Stroke Sister   ? Hypertension Mother   ? Stroke Mother   ? Alcohol abuse Brother   ? HIV/AIDS Brother   ? Colon cancer Neg Hx   ? Colon polyps Neg Hx   ? Esophageal cancer Neg Hx   ? Rectal cancer Neg Hx   ? Stomach cancer Neg Hx   ? ?Michele Page has 2 sons (22 and 84) and a daughter (86). She had 5 brothers and 1 sister. Her sister had metastatic cancer at the time of her diagnosis, unsure primary, she passed at 60.  ? ?Michele Page's mother died at 51 of a stroke. Patient had 2 maternal uncles, 1 aunt. Her aunt's granddaughter had breast cancer in her 67s. No other known cancers on this side of the family. ? ?Michele Page's father died in his 11s, limited information about his side of the family. No known cancers. ? ?Michele Page is unaware of previous family history of genetic testing for hereditary cancer risks. There is no reported Ashkenazi Jewish ancestry. There is no known consanguinity. ? ?GENETIC COUNSELING ASSESSMENT: Michele Page is a 66 y.o. female with a personal history of breast cancer which is somewhat suggestive of a hereditary cancer syndrome and  predisposition to cancer. We, therefore, discussed and recommended the following at today's visit.  ? ?DISCUSSION: We discussed that approximately 10% of breast cancer is hereditary. Most cases of hereditary

## 2022-02-24 ENCOUNTER — Telehealth: Payer: Self-pay | Admitting: Hematology and Oncology

## 2022-02-24 ENCOUNTER — Ambulatory Visit
Admission: RE | Admit: 2022-02-24 | Discharge: 2022-02-24 | Disposition: A | Discharge status: Home / Self Care | Payer: Medicare HMO | Source: Ambulatory Visit | Attending: Radiation Oncology | Admitting: Radiation Oncology

## 2022-02-24 ENCOUNTER — Encounter: Payer: Self-pay | Admitting: *Deleted

## 2022-02-24 DIAGNOSIS — C50412 Malignant neoplasm of upper-outer quadrant of left female breast: Secondary | ICD-10-CM | POA: Diagnosis not present

## 2022-02-24 DIAGNOSIS — Z170 Estrogen receptor positive status [ER+]: Secondary | ICD-10-CM | POA: Diagnosis not present

## 2022-02-24 DIAGNOSIS — C50411 Malignant neoplasm of upper-outer quadrant of right female breast: Secondary | ICD-10-CM | POA: Diagnosis not present

## 2022-02-24 DIAGNOSIS — Z510 Encounter for antineoplastic radiation therapy: Secondary | ICD-10-CM | POA: Diagnosis not present

## 2022-02-24 NOTE — Telephone Encounter (Signed)
Per 4/11 in basket.  Called pt left message with appointment details, left call back number if changes are needed.  ?

## 2022-02-24 NOTE — Progress Notes (Signed)
Pt here for patient teaching.  Pt given Radiation and You booklet, skin care instructions, and Radiaplex.  Patient was advised to obtain over the counter aluminum free deodorant.  Reviewed areas of pertinence such as fatigue, hair loss, skin changes, breast tenderness, and breast swelling. Pt able to give teach back of to pat skin and use unscented/gentle soap,apply Radiaplex bid, avoid applying anything to skin within 4 hours of treatment, avoid wearing an under wire bra, and to use an electric razor if they must shave. Pt verbalizes understanding of information given and will contact nursing with any questions or concerns.   ?

## 2022-02-25 ENCOUNTER — Other Ambulatory Visit: Payer: Self-pay

## 2022-02-25 ENCOUNTER — Ambulatory Visit
Admission: RE | Admit: 2022-02-25 | Discharge: 2022-02-25 | Disposition: A | Discharge status: Home / Self Care | Payer: Medicare HMO | Source: Ambulatory Visit | Attending: Radiation Oncology | Admitting: Radiation Oncology

## 2022-02-25 ENCOUNTER — Other Ambulatory Visit: Payer: Self-pay | Admitting: *Deleted

## 2022-02-25 DIAGNOSIS — C50412 Malignant neoplasm of upper-outer quadrant of left female breast: Secondary | ICD-10-CM | POA: Diagnosis not present

## 2022-02-25 DIAGNOSIS — Z170 Estrogen receptor positive status [ER+]: Secondary | ICD-10-CM | POA: Diagnosis not present

## 2022-02-25 DIAGNOSIS — C50411 Malignant neoplasm of upper-outer quadrant of right female breast: Secondary | ICD-10-CM | POA: Diagnosis not present

## 2022-02-25 DIAGNOSIS — Z510 Encounter for antineoplastic radiation therapy: Secondary | ICD-10-CM | POA: Diagnosis not present

## 2022-02-25 MED ORDER — NIFEDIPINE ER OSMOTIC RELEASE 60 MG PO TB24: 60.0000 mg | ORAL_TABLET | Freq: Two times a day (BID) | ORAL | 1 refills | Status: DC

## 2022-02-25 NOTE — Telephone Encounter (Signed)
Patient called requesting for you to refill medication.  ?Pended Rx and sent to [facility] for approval.  ?

## 2022-02-26 ENCOUNTER — Ambulatory Visit
Admission: RE | Admit: 2022-02-26 | Discharge: 2022-02-26 | Disposition: A | Discharge status: Home / Self Care | Payer: Medicare HMO | Source: Ambulatory Visit | Attending: Radiation Oncology | Admitting: Radiation Oncology

## 2022-02-26 DIAGNOSIS — Z170 Estrogen receptor positive status [ER+]: Secondary | ICD-10-CM | POA: Diagnosis not present

## 2022-02-26 DIAGNOSIS — C50412 Malignant neoplasm of upper-outer quadrant of left female breast: Secondary | ICD-10-CM | POA: Diagnosis not present

## 2022-02-26 DIAGNOSIS — C50411 Malignant neoplasm of upper-outer quadrant of right female breast: Secondary | ICD-10-CM | POA: Diagnosis not present

## 2022-02-26 DIAGNOSIS — Z510 Encounter for antineoplastic radiation therapy: Secondary | ICD-10-CM | POA: Diagnosis not present

## 2022-02-27 ENCOUNTER — Other Ambulatory Visit: Payer: Self-pay

## 2022-02-27 ENCOUNTER — Ambulatory Visit
Admission: RE | Admit: 2022-02-27 | Discharge: 2022-02-27 | Disposition: A | Discharge status: Home / Self Care | Payer: Medicare HMO | Source: Ambulatory Visit | Attending: Radiation Oncology | Admitting: Radiation Oncology

## 2022-02-27 DIAGNOSIS — Z170 Estrogen receptor positive status [ER+]: Secondary | ICD-10-CM | POA: Diagnosis not present

## 2022-02-27 DIAGNOSIS — C50412 Malignant neoplasm of upper-outer quadrant of left female breast: Secondary | ICD-10-CM | POA: Diagnosis not present

## 2022-02-27 DIAGNOSIS — C50411 Malignant neoplasm of upper-outer quadrant of right female breast: Secondary | ICD-10-CM | POA: Diagnosis not present

## 2022-02-27 DIAGNOSIS — Z510 Encounter for antineoplastic radiation therapy: Secondary | ICD-10-CM | POA: Diagnosis not present

## 2022-02-27 MED ORDER — RADIAPLEXRX EX GEL: Freq: Once | CUTANEOUS | Status: AC

## 2022-03-02 ENCOUNTER — Other Ambulatory Visit: Payer: Self-pay

## 2022-03-02 ENCOUNTER — Ambulatory Visit
Admission: RE | Admit: 2022-03-02 | Discharge: 2022-03-02 | Disposition: A | Discharge status: Home / Self Care | Payer: Medicare HMO | Source: Ambulatory Visit | Attending: Radiation Oncology | Admitting: Radiation Oncology

## 2022-03-02 DIAGNOSIS — Z170 Estrogen receptor positive status [ER+]: Secondary | ICD-10-CM | POA: Diagnosis not present

## 2022-03-02 DIAGNOSIS — C50411 Malignant neoplasm of upper-outer quadrant of right female breast: Secondary | ICD-10-CM | POA: Diagnosis not present

## 2022-03-02 DIAGNOSIS — Z510 Encounter for antineoplastic radiation therapy: Secondary | ICD-10-CM | POA: Diagnosis not present

## 2022-03-02 DIAGNOSIS — C50412 Malignant neoplasm of upper-outer quadrant of left female breast: Secondary | ICD-10-CM | POA: Diagnosis not present

## 2022-03-03 ENCOUNTER — Ambulatory Visit
Admission: RE | Admit: 2022-03-03 | Discharge: 2022-03-03 | Disposition: A | Discharge status: Home / Self Care | Payer: Medicare HMO | Source: Ambulatory Visit | Attending: Radiation Oncology | Admitting: Radiation Oncology

## 2022-03-03 ENCOUNTER — Other Ambulatory Visit: Payer: Self-pay

## 2022-03-03 DIAGNOSIS — Z510 Encounter for antineoplastic radiation therapy: Secondary | ICD-10-CM | POA: Diagnosis not present

## 2022-03-03 DIAGNOSIS — C50411 Malignant neoplasm of upper-outer quadrant of right female breast: Secondary | ICD-10-CM | POA: Diagnosis not present

## 2022-03-03 DIAGNOSIS — C50412 Malignant neoplasm of upper-outer quadrant of left female breast: Secondary | ICD-10-CM | POA: Diagnosis not present

## 2022-03-03 DIAGNOSIS — Z170 Estrogen receptor positive status [ER+]: Secondary | ICD-10-CM | POA: Diagnosis not present

## 2022-03-03 LAB — RAD ONC ARIA SESSION SUMMARY
Course Elapsed Days: 7
Plan Fractions Treated to Date: 6
Plan Fractions Treated to Date: 6
Plan Prescribed Dose Per Fraction: 1.6646 Gy
Plan Prescribed Dose Per Fraction: 1.8 Gy
Plan Total Fractions Prescribed: 28
Plan Total Fractions Prescribed: 28
Plan Total Prescribed Dose: 46.608 Gy
Plan Total Prescribed Dose: 50.4 Gy
Reference Point Dosage Given to Date: 10.8 Gy
Reference Point Dosage Given to Date: 9.9874 Gy
Reference Point Session Dosage Given: 1.6646 Gy
Reference Point Session Dosage Given: 1.8 Gy
Session Number: 6

## 2022-03-04 ENCOUNTER — Other Ambulatory Visit: Payer: Self-pay

## 2022-03-04 ENCOUNTER — Ambulatory Visit
Admission: RE | Admit: 2022-03-04 | Discharge: 2022-03-04 | Disposition: A | Discharge status: Home / Self Care | Payer: Medicare HMO | Source: Ambulatory Visit | Attending: Radiation Oncology | Admitting: Radiation Oncology

## 2022-03-04 DIAGNOSIS — Z170 Estrogen receptor positive status [ER+]: Secondary | ICD-10-CM | POA: Diagnosis not present

## 2022-03-04 DIAGNOSIS — C50411 Malignant neoplasm of upper-outer quadrant of right female breast: Secondary | ICD-10-CM | POA: Diagnosis not present

## 2022-03-04 DIAGNOSIS — Z510 Encounter for antineoplastic radiation therapy: Secondary | ICD-10-CM | POA: Diagnosis not present

## 2022-03-04 DIAGNOSIS — C50412 Malignant neoplasm of upper-outer quadrant of left female breast: Secondary | ICD-10-CM | POA: Diagnosis not present

## 2022-03-04 LAB — RAD ONC ARIA SESSION SUMMARY
Course Elapsed Days: 8
Plan Fractions Treated to Date: 1
Plan Fractions Treated to Date: 7
Plan Prescribed Dose Per Fraction: 1.8 Gy
Plan Prescribed Dose Per Fraction: 1.8 Gy
Plan Total Fractions Prescribed: 22
Plan Total Fractions Prescribed: 28
Plan Total Prescribed Dose: 39.6 Gy
Plan Total Prescribed Dose: 50.4 Gy
Reference Point Dosage Given to Date: 12.6 Gy
Reference Point Dosage Given to Date: 12.6 Gy
Reference Point Session Dosage Given: 1.8 Gy
Reference Point Session Dosage Given: 1.8 Gy
Session Number: 7

## 2022-03-05 ENCOUNTER — Other Ambulatory Visit: Payer: Self-pay

## 2022-03-05 ENCOUNTER — Ambulatory Visit: Payer: Self-pay | Admitting: Licensed Clinical Social Worker

## 2022-03-05 ENCOUNTER — Encounter: Payer: Self-pay | Admitting: Licensed Clinical Social Worker

## 2022-03-05 ENCOUNTER — Telehealth: Payer: Self-pay | Admitting: Licensed Clinical Social Worker

## 2022-03-05 ENCOUNTER — Ambulatory Visit
Admission: RE | Admit: 2022-03-05 | Discharge: 2022-03-05 | Disposition: A | Discharge status: Home / Self Care | Payer: Medicare HMO | Source: Ambulatory Visit | Attending: Radiation Oncology | Admitting: Radiation Oncology

## 2022-03-05 DIAGNOSIS — Z1379 Encounter for other screening for genetic and chromosomal anomalies: Secondary | ICD-10-CM | POA: Insufficient documentation

## 2022-03-05 DIAGNOSIS — C50412 Malignant neoplasm of upper-outer quadrant of left female breast: Secondary | ICD-10-CM | POA: Diagnosis not present

## 2022-03-05 DIAGNOSIS — C50411 Malignant neoplasm of upper-outer quadrant of right female breast: Secondary | ICD-10-CM | POA: Diagnosis not present

## 2022-03-05 DIAGNOSIS — Z170 Estrogen receptor positive status [ER+]: Secondary | ICD-10-CM | POA: Diagnosis not present

## 2022-03-05 DIAGNOSIS — Z510 Encounter for antineoplastic radiation therapy: Secondary | ICD-10-CM | POA: Diagnosis not present

## 2022-03-05 LAB — RAD ONC ARIA SESSION SUMMARY
Course Elapsed Days: 9
Plan Fractions Treated to Date: 2
Plan Fractions Treated to Date: 8
Plan Prescribed Dose Per Fraction: 1.8 Gy
Plan Prescribed Dose Per Fraction: 1.8 Gy
Plan Total Fractions Prescribed: 22
Plan Total Fractions Prescribed: 28
Plan Total Prescribed Dose: 39.6 Gy
Plan Total Prescribed Dose: 50.4 Gy
Reference Point Dosage Given to Date: 14.4 Gy
Reference Point Dosage Given to Date: 14.4 Gy
Reference Point Session Dosage Given: 1.8 Gy
Reference Point Session Dosage Given: 1.8 Gy
Session Number: 8

## 2022-03-05 NOTE — Telephone Encounter (Signed)
Revealed negative genetic testing.   This normal result is reassuring and indicates that it is unlikely Michele Page's cancer is due to a hereditary cause.  It is unlikely that there is an increased risk of another cancer due to a mutation in one of these genes.  However, genetic testing is not perfect, and cannot definitively rule out a hereditary cause.  It will be important for her to keep in contact with genetics to learn if any additional testing may be needed in the future.    ? ?

## 2022-03-05 NOTE — Progress Notes (Signed)
HPI:  [name] was previously seen in the [facility] due to a personal and family history of cancer and concerns regarding a hereditary predisposition to cancer. Please refer to our prior cancer genetics clinic note for more information regarding our discussion, assessment and recommendations, at the time. [name] recent genetic test results were disclosed to her, as were recommendations warranted by these results. These results and recommendations are discussed in more detail below. ? ?CANCER HISTORY:  ?Oncology History  ?Breast cancer of upper-outer quadrant of left female breast ([place])  ?03/31/2016 Initial Diagnosis  ? Screening detected left breast mass, 2 nodules, 1.9 x 1.6 x 0.8 cm= fat necrosis; 8 x 7 x 7 mm= grade 2 IDC ER 100%, PR 100%, HER-2 negative ratio 1.39, Ki-67 20% ? ?  ?04/17/2016 Surgery  ? Left lumpectomy ([name]): IDC grade 2, 1.3 cm, with DCIS, margins negative, 0/4 lymph nodes negative, T1 cN0 stage IA pathologic stage, ER 100%, PR 100%, HER-2 negative ratio 1.39, Ki-67 20% Oncotype DX score 20, 13% ROR, intermediate risk ?  ?04/17/2016 Oncotype testing  ? Recurrence score: 20; ROR 15% (intermediate risk)  ? ?  ?05/27/2016 - 07/13/2016 Radiation Therapy  ? Adjuvant radiation therapy [facility]): Left breast treated with breath hold to 50.4 Gy in 28 fractions at 1.8 Gy/fraction.  Left breast boosted to 10 Gy in 5 fractions at 2 Gy/fraction ? ?  ?07/23/2016 -  Anti-estrogen oral therapy  ? Anastrozole 1 mg switched to letrozole 04/22/2017 due to hair loss, switch to tamoxifen 10/22/2017 due to hair loss from letrozole as well ? ?  ?Malignant neoplasm of upper-outer quadrant of right breast in female, estrogen receptor positive ([place])  ?08/04/2021 Initial Diagnosis  ? Malignant neoplasm of upper-outer quadrant of right breast in female, estrogen receptor positive ([place]) ?  ? Relapse/Recurrence  ? 2.2 cm ILC ER 100%, PR 90%, Her 2 Neg, Ki 67: 25%, Size 2.5 cm ?  ?08/04/2021 Cancer Staging   ? Staging form: Breast, AJCC 8th Edition ?- Clinical: Stage IB (cT2, cN0, cM0, G2, ER+, PR+, HER2-) - Signed by Nicholas Lose, MD on 08/04/2021 ?Stage prefix: Initial diagnosis ?Histologic grading system: 3 grade system ? ?  ?09/02/2021 Surgery  ? Right lumpectomy: Grade 2 ILC 3.1 cm, margins negative, LVI present, 4/6 lymph nodes macrometastases, 2 lymph nodes isolated tumor cells, ER 100%, PR 90%, Ki-67 25%, HER2 negative ?  ?10/17/2021 -  Chemotherapy  ? Patient is on Treatment Plan : BREAST Adjuvant CMF IV q21d  ? ?  ?  ? Genetic Testing  ? Negative genetic testing. No pathogenic variants identified on the [facility] CustomNext+RNA panel. The report date is 03/04/2022. ? ?The CustomNext-Cancer+RNAinsight panel offered by [facility] includes sequencing and rearrangement analysis for the following 47 genes:  APC, ATM, AXIN2, BARD1, BMPR1A, BRCA1, BRCA2, BRIP1, CDH1, CDK4, CDKN2A, CHEK2, DICER1, EPCAM, GREM1, HOXB13, MEN1, MLH1, MSH2, MSH3, MSH6, MUTYH, NBN, NF1, NF2, NTHL1, PALB2, PMS2, POLD1, POLE, PTEN, RAD51C, RAD51D, RECQL, RET, SDHA, SDHAF2, SDHB, SDHC, SDHD, SMAD4, SMARCA4, STK11, TP53, TSC1, TSC2, and VHL.  RNA data is routinely analyzed for use in variant interpretation for all genes. ?  ? ? ?FAMILY HISTORY:  ?We obtained a detailed, 4-generation family history.  Significant diagnoses are listed below: ?Family History  ?Problem Relation Age of Onset  ? Hypertension Mother   ? Stroke Mother   ? Stroke Sister   ? Alcohol abuse Brother   ? HIV/AIDS Brother   ? Breast cancer Cousin   ?  dx 23s  ? Colon cancer Neg Hx   ? Colon polyps Neg Hx   ? Esophageal cancer Neg Hx   ? Rectal cancer Neg Hx   ? Stomach cancer Neg Hx   ? ? ?[name] has 2 sons (96 and 105) and a daughter (2). She had 5 brothers and 1 sister. Her sister had metastatic cancer at the time of her diagnosis, unsure primary, she passed at 19.  ?  ?[name]'s mother died at 67 of a stroke. Patient had 2 maternal uncles, 1 aunt. Her aunt's  granddaughter had breast cancer in her 45s. No other known cancers on this side of the family. ?  ?[name]'s father died in his 41s, limited information about his side of the family. No known cancers. ?  ?[name] is unaware of previous family history of genetic testing for hereditary cancer risks. There is no reported Ashkenazi Jewish ancestry. There is no known consanguinity. ?  ? ?GENETIC TEST RESULTS: Genetic testing reported out on 03/04/2022 through the [facility] CustomNext+RNA cancer panel found no pathogenic mutations.  ? ?The CustomNext-Cancer+RNAinsight panel offered by [facility] includes sequencing and rearrangement analysis for the following 47 genes:  APC, ATM, AXIN2, BARD1, BMPR1A, BRCA1, BRCA2, BRIP1, CDH1, CDK4, CDKN2A, CHEK2, DICER1, EPCAM, GREM1, HOXB13, MEN1, MLH1, MSH2, MSH3, MSH6, MUTYH, NBN, NF1, NF2, NTHL1, PALB2, PMS2, POLD1, POLE, PTEN, RAD51C, RAD51D, RECQL, RET, SDHA, SDHAF2, SDHB, SDHC, SDHD, SMAD4, SMARCA4, STK11, TP53, TSC1, TSC2, and VHL.  RNA data is routinely analyzed for use in variant interpretation for all genes.  ? ?The test report has been scanned into EPIC and is located under the Molecular Pathology section of the Results Review tab.  A portion of the result report is included below for reference.  ? ? ? ?We discussed that because current genetic testing is not perfect, it is possible there may be a gene mutation in one of these genes that current testing cannot detect, but that chance is small.  There could be another gene that has not yet been discovered, or that we have not yet tested, that is responsible for the cancer diagnoses in the family. It is also possible there is a hereditary cause for the cancer in the family that [name] did not inherit and therefore was not identified in her testing.  Therefore, it is important to remain in touch with cancer genetics in the future so that we can continue to offer [name] the most up to date genetic testing.  ? ?ADDITIONAL  GENETIC TESTING: We discussed with [name] that her genetic testing was fairly extensive.  If there are genes identified to increase cancer risk that can be analyzed in the future, we would be happy to discuss and coordinate this testing at that time.   ? ?CANCER SCREENING RECOMMENDATIONS: [name] test result is considered negative (normal).  This means that we have not identified a hereditary cause for her  personal and family history of cancer at this time. Most cancers happen by chance and this negative test suggests that her cancer may fall into this category.   ? ?While reassuring, this does not definitively rule out a hereditary predisposition to cancer. It is still possible that there could be genetic mutations that are undetectable by current technology. There could be genetic mutations in genes that have not been tested or identified to increase cancer risk.  Therefore, it is recommended she continue to follow the cancer management and screening guidelines provided by  her oncology and primary healthcare provider.  ? ?An individual's cancer risk and medical management are not determined by genetic test results alone. Overall cancer risk assessment incorporates additional factors, including personal medical history, family history, and any available genetic information that may result in a personalized plan for cancer prevention and surveillance. ? ?RECOMMENDATIONS FOR FAMILY MEMBERS:  Relatives in this family might be at some increased risk of developing cancer, over the general population risk, simply due to the family history of cancer.  We recommended female relatives in this family have a yearly mammogram beginning at age 56, or 72 years younger than the earliest onset of cancer, an annual clinical breast exam, and perform monthly breast self-exams. Female relatives in this family should also have a gynecological exam as recommended by their primary provider.  All family members should be referred  for colonoscopy starting at age 8.  ? ?FOLLOW-UP: Lastly, we discussed with [name] that cancer genetics is a rapidly advancing field and it is possible that new genetic tests will be appropriate for her and/or

## 2022-03-06 ENCOUNTER — Ambulatory Visit
Admission: RE | Admit: 2022-03-06 | Discharge: 2022-03-06 | Disposition: A | Discharge status: Home / Self Care | Payer: Medicare HMO | Source: Ambulatory Visit | Attending: Radiation Oncology | Admitting: Radiation Oncology

## 2022-03-06 ENCOUNTER — Other Ambulatory Visit: Payer: Self-pay

## 2022-03-06 DIAGNOSIS — Z510 Encounter for antineoplastic radiation therapy: Secondary | ICD-10-CM | POA: Diagnosis not present

## 2022-03-06 DIAGNOSIS — C50412 Malignant neoplasm of upper-outer quadrant of left female breast: Secondary | ICD-10-CM | POA: Diagnosis not present

## 2022-03-06 DIAGNOSIS — Z170 Estrogen receptor positive status [ER+]: Secondary | ICD-10-CM | POA: Diagnosis not present

## 2022-03-06 DIAGNOSIS — C50411 Malignant neoplasm of upper-outer quadrant of right female breast: Secondary | ICD-10-CM | POA: Diagnosis not present

## 2022-03-06 LAB — RAD ONC ARIA SESSION SUMMARY
Course Elapsed Days: 10
Plan Fractions Treated to Date: 3
Plan Fractions Treated to Date: 9
Plan Prescribed Dose Per Fraction: 1.8 Gy
Plan Prescribed Dose Per Fraction: 1.8 Gy
Plan Total Fractions Prescribed: 22
Plan Total Fractions Prescribed: 28
Plan Total Prescribed Dose: 39.6 Gy
Plan Total Prescribed Dose: 50.4 Gy
Reference Point Dosage Given to Date: 16.2 Gy
Reference Point Dosage Given to Date: 16.2 Gy
Reference Point Session Dosage Given: 1.8 Gy
Reference Point Session Dosage Given: 1.8 Gy
Session Number: 9

## 2022-03-09 ENCOUNTER — Ambulatory Visit
Admission: RE | Admit: 2022-03-09 | Discharge: 2022-03-09 | Disposition: A | Discharge status: Home / Self Care | Payer: Medicare HMO | Source: Ambulatory Visit | Attending: Radiation Oncology | Admitting: Radiation Oncology

## 2022-03-09 ENCOUNTER — Other Ambulatory Visit: Payer: Self-pay

## 2022-03-09 DIAGNOSIS — C50412 Malignant neoplasm of upper-outer quadrant of left female breast: Secondary | ICD-10-CM | POA: Diagnosis not present

## 2022-03-09 DIAGNOSIS — Z170 Estrogen receptor positive status [ER+]: Secondary | ICD-10-CM | POA: Diagnosis not present

## 2022-03-09 DIAGNOSIS — C50411 Malignant neoplasm of upper-outer quadrant of right female breast: Secondary | ICD-10-CM | POA: Diagnosis not present

## 2022-03-09 DIAGNOSIS — Z510 Encounter for antineoplastic radiation therapy: Secondary | ICD-10-CM | POA: Diagnosis not present

## 2022-03-09 LAB — RAD ONC ARIA SESSION SUMMARY
Course Elapsed Days: 13
Plan Fractions Treated to Date: 10
Plan Fractions Treated to Date: 4
Plan Prescribed Dose Per Fraction: 1.8 Gy
Plan Prescribed Dose Per Fraction: 1.8 Gy
Plan Total Fractions Prescribed: 22
Plan Total Fractions Prescribed: 28
Plan Total Prescribed Dose: 39.6 Gy
Plan Total Prescribed Dose: 50.4 Gy
Reference Point Dosage Given to Date: 18 Gy
Reference Point Dosage Given to Date: 18 Gy
Reference Point Session Dosage Given: 1.8 Gy
Reference Point Session Dosage Given: 1.8 Gy
Session Number: 10

## 2022-03-10 ENCOUNTER — Other Ambulatory Visit: Payer: Self-pay

## 2022-03-10 ENCOUNTER — Ambulatory Visit
Admission: RE | Admit: 2022-03-10 | Discharge: 2022-03-10 | Disposition: A | Discharge status: Home / Self Care | Payer: Medicare HMO | Source: Ambulatory Visit | Attending: Radiation Oncology | Admitting: Radiation Oncology

## 2022-03-10 DIAGNOSIS — Z170 Estrogen receptor positive status [ER+]: Secondary | ICD-10-CM | POA: Diagnosis not present

## 2022-03-10 DIAGNOSIS — C50411 Malignant neoplasm of upper-outer quadrant of right female breast: Secondary | ICD-10-CM | POA: Diagnosis not present

## 2022-03-10 DIAGNOSIS — C50412 Malignant neoplasm of upper-outer quadrant of left female breast: Secondary | ICD-10-CM | POA: Diagnosis not present

## 2022-03-10 DIAGNOSIS — Z510 Encounter for antineoplastic radiation therapy: Secondary | ICD-10-CM | POA: Diagnosis not present

## 2022-03-10 LAB — RAD ONC ARIA SESSION SUMMARY
Course Elapsed Days: 14
Plan Fractions Treated to Date: 11
Plan Fractions Treated to Date: 5
Plan Prescribed Dose Per Fraction: 1.8 Gy
Plan Prescribed Dose Per Fraction: 1.8 Gy
Plan Total Fractions Prescribed: 22
Plan Total Fractions Prescribed: 28
Plan Total Prescribed Dose: 39.6 Gy
Plan Total Prescribed Dose: 50.4 Gy
Reference Point Dosage Given to Date: 19.8 Gy
Reference Point Dosage Given to Date: 19.8 Gy
Reference Point Session Dosage Given: 1.8 Gy
Reference Point Session Dosage Given: 1.8 Gy
Session Number: 11

## 2022-03-11 ENCOUNTER — Other Ambulatory Visit: Payer: Self-pay

## 2022-03-11 ENCOUNTER — Ambulatory Visit
Admission: RE | Admit: 2022-03-11 | Discharge: 2022-03-11 | Disposition: A | Discharge status: Home / Self Care | Payer: Medicare HMO | Source: Ambulatory Visit | Attending: Radiation Oncology | Admitting: Radiation Oncology

## 2022-03-11 DIAGNOSIS — Z510 Encounter for antineoplastic radiation therapy: Secondary | ICD-10-CM | POA: Diagnosis not present

## 2022-03-11 DIAGNOSIS — C50412 Malignant neoplasm of upper-outer quadrant of left female breast: Secondary | ICD-10-CM | POA: Diagnosis not present

## 2022-03-11 DIAGNOSIS — C50411 Malignant neoplasm of upper-outer quadrant of right female breast: Secondary | ICD-10-CM | POA: Diagnosis not present

## 2022-03-11 DIAGNOSIS — Z170 Estrogen receptor positive status [ER+]: Secondary | ICD-10-CM | POA: Diagnosis not present

## 2022-03-11 LAB — RAD ONC ARIA SESSION SUMMARY
Course Elapsed Days: 15
Plan Fractions Treated to Date: 12
Plan Fractions Treated to Date: 6
Plan Prescribed Dose Per Fraction: 1.8 Gy
Plan Prescribed Dose Per Fraction: 1.8 Gy
Plan Total Fractions Prescribed: 22
Plan Total Fractions Prescribed: 28
Plan Total Prescribed Dose: 39.6 Gy
Plan Total Prescribed Dose: 50.4 Gy
Reference Point Dosage Given to Date: 21.6 Gy
Reference Point Dosage Given to Date: 21.6 Gy
Reference Point Session Dosage Given: 1.8 Gy
Reference Point Session Dosage Given: 1.8 Gy
Session Number: 12

## 2022-03-12 ENCOUNTER — Other Ambulatory Visit: Payer: Self-pay

## 2022-03-12 ENCOUNTER — Ambulatory Visit
Admission: RE | Admit: 2022-03-12 | Discharge: 2022-03-12 | Disposition: A | Discharge status: Home / Self Care | Payer: Medicare HMO | Source: Ambulatory Visit | Attending: Radiation Oncology | Admitting: Radiation Oncology

## 2022-03-12 DIAGNOSIS — C50411 Malignant neoplasm of upper-outer quadrant of right female breast: Secondary | ICD-10-CM | POA: Diagnosis not present

## 2022-03-12 DIAGNOSIS — Z510 Encounter for antineoplastic radiation therapy: Secondary | ICD-10-CM | POA: Diagnosis not present

## 2022-03-12 DIAGNOSIS — C50412 Malignant neoplasm of upper-outer quadrant of left female breast: Secondary | ICD-10-CM | POA: Diagnosis not present

## 2022-03-12 DIAGNOSIS — Z170 Estrogen receptor positive status [ER+]: Secondary | ICD-10-CM | POA: Diagnosis not present

## 2022-03-12 LAB — RAD ONC ARIA SESSION SUMMARY
Course Elapsed Days: 16
Plan Fractions Treated to Date: 13
Plan Fractions Treated to Date: 7
Plan Prescribed Dose Per Fraction: 1.8 Gy
Plan Prescribed Dose Per Fraction: 1.8 Gy
Plan Total Fractions Prescribed: 22
Plan Total Fractions Prescribed: 28
Plan Total Prescribed Dose: 39.6 Gy
Plan Total Prescribed Dose: 50.4 Gy
Reference Point Dosage Given to Date: 23.4 Gy
Reference Point Dosage Given to Date: 23.4 Gy
Reference Point Session Dosage Given: 1.8 Gy
Reference Point Session Dosage Given: 1.8 Gy
Session Number: 13

## 2022-03-13 ENCOUNTER — Other Ambulatory Visit: Payer: Self-pay

## 2022-03-13 ENCOUNTER — Ambulatory Visit
Admission: RE | Admit: 2022-03-13 | Discharge: 2022-03-13 | Disposition: A | Discharge status: Home / Self Care | Payer: Medicare HMO | Source: Ambulatory Visit | Attending: Radiation Oncology | Admitting: Radiation Oncology

## 2022-03-13 DIAGNOSIS — C50411 Malignant neoplasm of upper-outer quadrant of right female breast: Secondary | ICD-10-CM | POA: Diagnosis not present

## 2022-03-13 DIAGNOSIS — C50412 Malignant neoplasm of upper-outer quadrant of left female breast: Secondary | ICD-10-CM | POA: Diagnosis not present

## 2022-03-13 DIAGNOSIS — Z170 Estrogen receptor positive status [ER+]: Secondary | ICD-10-CM | POA: Diagnosis not present

## 2022-03-13 DIAGNOSIS — Z510 Encounter for antineoplastic radiation therapy: Secondary | ICD-10-CM | POA: Diagnosis not present

## 2022-03-13 LAB — RAD ONC ARIA SESSION SUMMARY
Course Elapsed Days: 17
Plan Fractions Treated to Date: 14
Plan Fractions Treated to Date: 8
Plan Prescribed Dose Per Fraction: 1.8 Gy
Plan Prescribed Dose Per Fraction: 1.8 Gy
Plan Total Fractions Prescribed: 22
Plan Total Fractions Prescribed: 28
Plan Total Prescribed Dose: 39.6 Gy
Plan Total Prescribed Dose: 50.4 Gy
Reference Point Dosage Given to Date: 25.2 Gy
Reference Point Dosage Given to Date: 25.2 Gy
Reference Point Session Dosage Given: 1.8 Gy
Reference Point Session Dosage Given: 1.8 Gy
Session Number: 14

## 2022-03-16 ENCOUNTER — Other Ambulatory Visit: Payer: Self-pay

## 2022-03-16 ENCOUNTER — Ambulatory Visit
Admission: RE | Admit: 2022-03-16 | Discharge: 2022-03-16 | Disposition: A | Discharge status: Home / Self Care | Payer: Medicare HMO | Source: Ambulatory Visit | Attending: Radiation Oncology | Admitting: Radiation Oncology

## 2022-03-16 DIAGNOSIS — C50411 Malignant neoplasm of upper-outer quadrant of right female breast: Secondary | ICD-10-CM | POA: Diagnosis not present

## 2022-03-16 DIAGNOSIS — Z510 Encounter for antineoplastic radiation therapy: Secondary | ICD-10-CM | POA: Insufficient documentation

## 2022-03-16 DIAGNOSIS — C50412 Malignant neoplasm of upper-outer quadrant of left female breast: Secondary | ICD-10-CM | POA: Diagnosis not present

## 2022-03-16 DIAGNOSIS — Z170 Estrogen receptor positive status [ER+]: Secondary | ICD-10-CM | POA: Diagnosis not present

## 2022-03-16 DIAGNOSIS — Z95828 Presence of other vascular implants and grafts: Secondary | ICD-10-CM | POA: Diagnosis not present

## 2022-03-16 LAB — RAD ONC ARIA SESSION SUMMARY
Course Elapsed Days: 20
Plan Fractions Treated to Date: 15
Plan Fractions Treated to Date: 9
Plan Prescribed Dose Per Fraction: 1.8 Gy
Plan Prescribed Dose Per Fraction: 1.8 Gy
Plan Total Fractions Prescribed: 22
Plan Total Fractions Prescribed: 28
Plan Total Prescribed Dose: 39.6 Gy
Plan Total Prescribed Dose: 50.4 Gy
Reference Point Dosage Given to Date: 27 Gy
Reference Point Dosage Given to Date: 27 Gy
Reference Point Session Dosage Given: 1.8 Gy
Reference Point Session Dosage Given: 1.8 Gy
Session Number: 15

## 2022-03-16 NOTE — Progress Notes (Signed)
? ?Patient Care Team: ?[name], NP as PCP - General (Geriatric Medicine) ?Lorretta Harp, MD as PCP - Cardiology (Cardiology) ?Nicholas Lose, MD as Consulting Physician (Oncology) ?Erroll Luna, MD as Consulting Physician (General Surgery) ? ?DIAGNOSIS:  ?Encounter Diagnosis  ?Name Primary?  ? Malignant neoplasm of upper-outer quadrant of right breast in female, estrogen receptor positive ([place])   ? ? ?SUMMARY OF ONCOLOGIC HISTORY: ?Oncology History  ?Breast cancer of upper-outer quadrant of left female breast ([place])  ?03/31/2016 Initial Diagnosis  ? Screening detected left breast mass, 2 nodules, 1.9 x 1.6 x 0.8 cm= fat necrosis; 8 x 7 x 7 mm= grade 2 IDC ER 100%, PR 100%, HER-2 negative ratio 1.39, Ki-67 20% ? ?  ?04/17/2016 Surgery  ? Left lumpectomy ([name]): IDC grade 2, 1.3 cm, with DCIS, margins negative, 0/4 lymph nodes negative, T1 cN0 stage IA pathologic stage, ER 100%, PR 100%, HER-2 negative ratio 1.39, Ki-67 20% Oncotype DX score 20, 13% ROR, intermediate risk ?  ?04/17/2016 Oncotype testing  ? Recurrence score: 20; ROR 15% (intermediate risk)  ? ?  ?05/27/2016 - 07/13/2016 Radiation Therapy  ? Adjuvant radiation therapy [facility]): Left breast treated with breath hold to 50.4 Gy in 28 fractions at 1.8 Gy/fraction.  Left breast boosted to 10 Gy in 5 fractions at 2 Gy/fraction ? ?  ?07/23/2016 -  Anti-estrogen oral therapy  ? Anastrozole 1 mg switched to letrozole 04/22/2017 due to hair loss, switch to tamoxifen 10/22/2017 due to hair loss from letrozole as well ? ?  ?Malignant neoplasm of upper-outer quadrant of right breast in female, estrogen receptor positive ([place])  ?08/04/2021 Initial Diagnosis  ? Malignant neoplasm of upper-outer quadrant of right breast in female, estrogen receptor positive ([place]) ?  ? Relapse/Recurrence  ? 2.2 cm ILC ER 100%, PR 90%, Her 2 Neg, Ki 67: 25%, Size 2.5 cm ?  ?08/04/2021 Cancer Staging  ? Staging form: Breast, AJCC 8th Edition ?- Clinical: Stage IB (cT2, cN0, cM0,  G2, ER+, PR+, HER2-) - Signed by Nicholas Lose, MD on 08/04/2021 ?Stage prefix: Initial diagnosis ?Histologic grading system: 3 grade system ? ?  ?09/02/2021 Surgery  ? Right lumpectomy: Grade 2 ILC 3.1 cm, margins negative, LVI present, 4/6 lymph nodes macrometastases, 2 lymph nodes isolated tumor cells, ER 100%, PR 90%, Ki-67 25%, HER2 negative ?  ?10/17/2021 -  Chemotherapy  ? Patient is on Treatment Plan : BREAST Adjuvant CMF IV q21d  ? ?   ? Genetic Testing  ? Negative genetic testing. No pathogenic variants identified on the [facility] CustomNext+RNA panel. The report date is 03/04/2022. ? ?The CustomNext-Cancer+RNAinsight panel offered by [facility] includes sequencing and rearrangement analysis for the following 47 genes:  APC, ATM, AXIN2, BARD1, BMPR1A, BRCA1, BRCA2, BRIP1, CDH1, CDK4, CDKN2A, CHEK2, DICER1, EPCAM, GREM1, HOXB13, MEN1, MLH1, MSH2, MSH3, MSH6, MUTYH, NBN, NF1, NF2, NTHL1, PALB2, PMS2, POLD1, POLE, PTEN, RAD51C, RAD51D, RECQL, RET, SDHA, SDHAF2, SDHB, SDHC, SDHD, SMAD4, SMARCA4, STK11, TP53, TSC1, TSC2, and VHL.  RNA data is routinely analyzed for use in variant interpretation for all genes. ?  ? ? ?CHIEF COMPLIANT: discuss antiestrogen therapy ? ?INTERVAL HISTORY: Michele Page is a  66 y.o. with above-mentioned history of breast cancer, currently on adjuvant chemotherapy with CMF. She presents to the clinic today for treatment. She state that she has some tiredness. Complains of headaches on the left back side of head.  ? ? ?ALLERGIES:  has No Known Allergies. ? ?MEDICATIONS:  ?Current Outpatient Medications  ?Medication  Sig Dispense Refill  ? [START ON 04/16/2022] abemaciclib (VERZENIO) 50 MG tablet Take 1 tablet (50 mg total) by mouth 2 (two) times daily. Swallow tablets whole. Do not chew, crush, or split tablets before swallowing. 60 tablet 0  ? aspirin EC 81 MG tablet Take 1 tablet (81 mg total) by mouth daily. 30 tablet 6  ? atorvastatin (LIPITOR) 20 MG tablet TAKE 1 TABLET EVERY DAY 90  tablet 1  ? eszopiclone (LUNESTA) 2 MG TABS tablet Take one tablet by mouth at bedtime as needed for sleep (take immediately before bedtime) 30 tablet 0  ? labetalol (NORMODYNE) 300 MG tablet TAKE 2 TABLETS THREE TIMES DAILY 540 tablet 2  ? NIFEdipine (PROCARDIA XL/NIFEDICAL XL) 60 MG 24 hr tablet Take 1 tablet (60 mg total) by mouth 2 (two) times daily. 180 tablet 1  ? ?No current facility-administered medications for this visit.  ? ? ?PHYSICAL EXAMINATION: ?ECOG PERFORMANCE STATUS: 1 - Symptomatic but completely ambulatory ? ?Vitals:  ? 03/30/22 1342  ?BP: (!) 143/83  ?Pulse: 68  ?Resp: 18  ?Temp: (!) 97.2 ?F (36.2 ?C)  ?SpO2: 99%  ? ?Filed Weights  ? 03/30/22 1342  ?Weight: 197 lb 14.4 oz (89.8 kg)  ? ?  ? ?LABORATORY DATA:  ?I have reviewed the data as listed ? ?  Latest Ref Rng & Units 01/30/2022  ? 11:34 AM 01/08/2022  ?  1:05 PM 12/18/2021  ?  1:15 PM  ?CMP  ?Glucose 70 - 99 mg/dL 150   162   140    ?BUN 8 - 23 mg/dL _0 ?Creatinine 0.44 - 1.00 mg/dL 1.80   1.48   1.24    ?Sodium 135 - 145 mmol/L 141   141   140    ?Potassium 3.5 - 5.1 mmol/L 3.5   3.6   4.0    ?Chloride 98 - 111 mmol/L 106   107   107    ?CO2 22 - 32 mmol/L _1 ?Calcium 8.9 - 10.3 mg/dL 9.6   9.4   9.4    ?Total Protein 6.5 - 8.1 g/dL 7.1   6.7   6.8    ?Total Bilirubin 0.3 - 1.2 mg/dL 0.4   0.5   0.3    ?Alkaline Phos 38 - 126 U/L 100   90   99    ?AST 15 - 41 U/L _2 ?ALT 0 - 44 U/L _3 ? ? ?Lab Results  ?Component Value Date  ? WBC 3.8 (L) 01/30/2022  ? HGB 11.4 (L) 01/30/2022  ? HCT 33.3 (L) 01/30/2022  ? MCV 92.2 01/30/2022  ? PLT 326 01/30/2022  ? NEUTROABS 1.8 01/30/2022  ? ? ?ASSESSMENT & PLAN:  ?Malignant neoplasm of upper-outer quadrant of right breast in female, estrogen receptor positive ([place]) ?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, 1.3 cm, with DCIS, margins negative, 0/4 lymph nodes negative, T1 cN0 stage IA pathologic stage, ER 100%, PR 100%, HER-2 negative ratio 1.39, Ki-67  20% ?(Originally 2 nodules were detected on screening mammogram 1.9 cm= fat necrosis and 8 mm IDC) ?Oncotype DX score 20, 13% risk of recurrence, intermediate risk ?Adjuvant radiation therapy started 07/20/2017completed 07/13/2016 ?  ?  ?09/02/2021:Right lumpectomy: Grade 2 ILC 3.1 cm, margins negative, LVI present, 4/6 lymph nodes macrometastases, 2 lymph nodes isolated tumor  cells, ER 100%, PR 90%, Ki-67 25%, HER2 negative ?  ?Treatment plan/summary: ?1. systemic chemotherapy with CMF x6 cycles completed 01/30/2022 ?2. adjuvant radiation therapy completing 04/09/2022 ?3.  For the continued antiestrogen therapy (previously anastrozole started 07/23/2016 switched to letrozole 2018 switched to tamoxifen 2018, switched back to anastrozole) hair loss was the main problem, may consider exemestane ?------------------------------------------------------------------------------------------------------------------------------- ?10/15/2021: CT CAP: No metastatic disease.  Descending and sigmoid colon diverticulosis, several tiny pulmonary nodules less than 5 mm, 2.1 cm left thyroid nodule ? ?Treatment plan: We discussed different treatment options and recommended anastrozole with Verzinio ? ?Abemaciclib counseling: I discussed at length the risks and benefits of Abemaciclib in combination with letrozole. Adverse effects of Abemaciclib include decreasing neutrophil count, pneumonia, blood clots in lungs as well as nausea and GI symptoms. Side effects of letrozole include hot flashes, muscle aches and pains, uterine bleeding/spotting/cancer, osteoporosis, risk of blood clots. ?We will start the patient at a low-dose of 50 mg p.o. twice daily and titrate IT upwards. ? ?Return to clinic to follow-up with Jenny Reichmann ? ? ? ?No orders of the defined types were placed in this encounter. ? ?The patient has a good understanding of the overall plan. she agrees with it. she will call with any problems that may develop before the next visit  here. ?Total time spent: 30 mins including face to face time and time spent for planning, charting and co-ordination of care ? ? Harriette Ohara, MD ?03/30/22 ? ? ? I Gardiner Coins am scribing for Dr. Lindi Adie ? ?I ha

## 2022-03-17 ENCOUNTER — Other Ambulatory Visit: Payer: Self-pay

## 2022-03-17 ENCOUNTER — Ambulatory Visit
Admission: RE | Admit: 2022-03-17 | Discharge: 2022-03-17 | Disposition: A | Discharge status: Home / Self Care | Payer: Medicare HMO | Source: Ambulatory Visit | Attending: Radiation Oncology | Admitting: Radiation Oncology

## 2022-03-17 DIAGNOSIS — C50412 Malignant neoplasm of upper-outer quadrant of left female breast: Secondary | ICD-10-CM | POA: Diagnosis not present

## 2022-03-17 DIAGNOSIS — Z170 Estrogen receptor positive status [ER+]: Secondary | ICD-10-CM | POA: Diagnosis not present

## 2022-03-17 DIAGNOSIS — C50411 Malignant neoplasm of upper-outer quadrant of right female breast: Secondary | ICD-10-CM | POA: Diagnosis not present

## 2022-03-17 DIAGNOSIS — Z510 Encounter for antineoplastic radiation therapy: Secondary | ICD-10-CM | POA: Diagnosis not present

## 2022-03-17 LAB — RAD ONC ARIA SESSION SUMMARY
Course Elapsed Days: 21
Plan Fractions Treated to Date: 10
Plan Fractions Treated to Date: 16
Plan Prescribed Dose Per Fraction: 1.8 Gy
Plan Prescribed Dose Per Fraction: 1.8 Gy
Plan Total Fractions Prescribed: 22
Plan Total Fractions Prescribed: 28
Plan Total Prescribed Dose: 39.6 Gy
Plan Total Prescribed Dose: 50.4 Gy
Reference Point Dosage Given to Date: 28.8 Gy
Reference Point Dosage Given to Date: 28.8 Gy
Reference Point Session Dosage Given: 1.8 Gy
Reference Point Session Dosage Given: 1.8 Gy
Session Number: 16

## 2022-03-18 ENCOUNTER — Ambulatory Visit
Admission: RE | Admit: 2022-03-18 | Discharge: 2022-03-18 | Disposition: A | Discharge status: Home / Self Care | Payer: Medicare HMO | Source: Ambulatory Visit | Attending: Radiation Oncology | Admitting: Radiation Oncology

## 2022-03-18 ENCOUNTER — Other Ambulatory Visit: Payer: Self-pay

## 2022-03-18 DIAGNOSIS — Z510 Encounter for antineoplastic radiation therapy: Secondary | ICD-10-CM | POA: Diagnosis not present

## 2022-03-18 DIAGNOSIS — C50411 Malignant neoplasm of upper-outer quadrant of right female breast: Secondary | ICD-10-CM | POA: Diagnosis not present

## 2022-03-18 DIAGNOSIS — Z170 Estrogen receptor positive status [ER+]: Secondary | ICD-10-CM | POA: Diagnosis not present

## 2022-03-18 DIAGNOSIS — C50412 Malignant neoplasm of upper-outer quadrant of left female breast: Secondary | ICD-10-CM | POA: Diagnosis not present

## 2022-03-18 LAB — RAD ONC ARIA SESSION SUMMARY
Course Elapsed Days: 22
Plan Fractions Treated to Date: 11
Plan Fractions Treated to Date: 17
Plan Prescribed Dose Per Fraction: 1.8 Gy
Plan Prescribed Dose Per Fraction: 1.8 Gy
Plan Total Fractions Prescribed: 22
Plan Total Fractions Prescribed: 28
Plan Total Prescribed Dose: 39.6 Gy
Plan Total Prescribed Dose: 50.4 Gy
Reference Point Dosage Given to Date: 30.6 Gy
Reference Point Dosage Given to Date: 30.6 Gy
Reference Point Session Dosage Given: 1.8 Gy
Reference Point Session Dosage Given: 1.8 Gy
Session Number: 17

## 2022-03-19 ENCOUNTER — Ambulatory Visit
Admission: RE | Admit: 2022-03-19 | Discharge: 2022-03-19 | Disposition: A | Discharge status: Home / Self Care | Payer: Medicare HMO | Source: Ambulatory Visit | Attending: Radiation Oncology | Admitting: Radiation Oncology

## 2022-03-19 ENCOUNTER — Other Ambulatory Visit: Payer: Self-pay

## 2022-03-19 DIAGNOSIS — C50411 Malignant neoplasm of upper-outer quadrant of right female breast: Secondary | ICD-10-CM | POA: Diagnosis not present

## 2022-03-19 DIAGNOSIS — Z510 Encounter for antineoplastic radiation therapy: Secondary | ICD-10-CM | POA: Diagnosis not present

## 2022-03-19 DIAGNOSIS — C50412 Malignant neoplasm of upper-outer quadrant of left female breast: Secondary | ICD-10-CM | POA: Diagnosis not present

## 2022-03-19 DIAGNOSIS — Z170 Estrogen receptor positive status [ER+]: Secondary | ICD-10-CM | POA: Diagnosis not present

## 2022-03-19 LAB — RAD ONC ARIA SESSION SUMMARY
Course Elapsed Days: 23
Plan Fractions Treated to Date: 12
Plan Fractions Treated to Date: 18
Plan Prescribed Dose Per Fraction: 1.8 Gy
Plan Prescribed Dose Per Fraction: 1.8 Gy
Plan Total Fractions Prescribed: 22
Plan Total Fractions Prescribed: 28
Plan Total Prescribed Dose: 39.6 Gy
Plan Total Prescribed Dose: 50.4 Gy
Reference Point Dosage Given to Date: 32.4 Gy
Reference Point Dosage Given to Date: 32.4 Gy
Reference Point Session Dosage Given: 1.8 Gy
Reference Point Session Dosage Given: 1.8 Gy
Session Number: 18

## 2022-03-20 ENCOUNTER — Other Ambulatory Visit: Payer: Self-pay

## 2022-03-20 ENCOUNTER — Ambulatory Visit
Admission: RE | Admit: 2022-03-20 | Discharge: 2022-03-20 | Disposition: A | Discharge status: Home / Self Care | Payer: Medicare HMO | Source: Ambulatory Visit | Attending: Radiation Oncology | Admitting: Radiation Oncology

## 2022-03-20 DIAGNOSIS — C50412 Malignant neoplasm of upper-outer quadrant of left female breast: Secondary | ICD-10-CM | POA: Diagnosis not present

## 2022-03-20 DIAGNOSIS — C50411 Malignant neoplasm of upper-outer quadrant of right female breast: Secondary | ICD-10-CM | POA: Diagnosis not present

## 2022-03-20 DIAGNOSIS — Z170 Estrogen receptor positive status [ER+]: Secondary | ICD-10-CM | POA: Diagnosis not present

## 2022-03-20 DIAGNOSIS — Z510 Encounter for antineoplastic radiation therapy: Secondary | ICD-10-CM | POA: Diagnosis not present

## 2022-03-20 LAB — RAD ONC ARIA SESSION SUMMARY
Course Elapsed Days: 24
Plan Fractions Treated to Date: 13
Plan Fractions Treated to Date: 19
Plan Prescribed Dose Per Fraction: 1.8 Gy
Plan Prescribed Dose Per Fraction: 1.8 Gy
Plan Total Fractions Prescribed: 22
Plan Total Fractions Prescribed: 28
Plan Total Prescribed Dose: 39.6 Gy
Plan Total Prescribed Dose: 50.4 Gy
Reference Point Dosage Given to Date: 34.2 Gy
Reference Point Dosage Given to Date: 34.2 Gy
Reference Point Session Dosage Given: 1.8 Gy
Reference Point Session Dosage Given: 1.8 Gy
Session Number: 19

## 2022-03-23 ENCOUNTER — Other Ambulatory Visit: Payer: Self-pay

## 2022-03-23 ENCOUNTER — Ambulatory Visit
Admission: RE | Admit: 2022-03-23 | Discharge: 2022-03-23 | Disposition: A | Discharge status: Home / Self Care | Payer: Medicare HMO | Source: Ambulatory Visit | Attending: Radiation Oncology | Admitting: Radiation Oncology

## 2022-03-23 DIAGNOSIS — C50411 Malignant neoplasm of upper-outer quadrant of right female breast: Secondary | ICD-10-CM | POA: Diagnosis not present

## 2022-03-23 DIAGNOSIS — Z510 Encounter for antineoplastic radiation therapy: Secondary | ICD-10-CM | POA: Diagnosis not present

## 2022-03-23 DIAGNOSIS — C50412 Malignant neoplasm of upper-outer quadrant of left female breast: Secondary | ICD-10-CM | POA: Diagnosis not present

## 2022-03-23 DIAGNOSIS — Z170 Estrogen receptor positive status [ER+]: Secondary | ICD-10-CM | POA: Diagnosis not present

## 2022-03-23 LAB — RAD ONC ARIA SESSION SUMMARY
Course Elapsed Days: 27
Plan Fractions Treated to Date: 14
Plan Fractions Treated to Date: 20
Plan Prescribed Dose Per Fraction: 1.8 Gy
Plan Prescribed Dose Per Fraction: 1.8 Gy
Plan Total Fractions Prescribed: 22
Plan Total Fractions Prescribed: 28
Plan Total Prescribed Dose: 39.6 Gy
Plan Total Prescribed Dose: 50.4 Gy
Reference Point Dosage Given to Date: 36 Gy
Reference Point Dosage Given to Date: 36 Gy
Reference Point Session Dosage Given: 1.8 Gy
Reference Point Session Dosage Given: 1.8 Gy
Session Number: 20

## 2022-03-24 ENCOUNTER — Other Ambulatory Visit: Payer: Self-pay

## 2022-03-24 ENCOUNTER — Encounter: Payer: Self-pay | Admitting: Hematology and Oncology

## 2022-03-24 ENCOUNTER — Ambulatory Visit
Admission: RE | Admit: 2022-03-24 | Discharge: 2022-03-24 | Disposition: A | Discharge status: Home / Self Care | Payer: Medicare HMO | Source: Ambulatory Visit | Attending: Radiation Oncology | Admitting: Radiation Oncology

## 2022-03-24 DIAGNOSIS — C50412 Malignant neoplasm of upper-outer quadrant of left female breast: Secondary | ICD-10-CM | POA: Diagnosis not present

## 2022-03-24 DIAGNOSIS — Z510 Encounter for antineoplastic radiation therapy: Secondary | ICD-10-CM | POA: Diagnosis not present

## 2022-03-24 DIAGNOSIS — Z170 Estrogen receptor positive status [ER+]: Secondary | ICD-10-CM | POA: Diagnosis not present

## 2022-03-24 DIAGNOSIS — C50411 Malignant neoplasm of upper-outer quadrant of right female breast: Secondary | ICD-10-CM | POA: Diagnosis not present

## 2022-03-24 LAB — RAD ONC ARIA SESSION SUMMARY
Course Elapsed Days: 28
Plan Fractions Treated to Date: 15
Plan Fractions Treated to Date: 21
Plan Prescribed Dose Per Fraction: 1.8 Gy
Plan Prescribed Dose Per Fraction: 1.8 Gy
Plan Total Fractions Prescribed: 22
Plan Total Fractions Prescribed: 28
Plan Total Prescribed Dose: 39.6 Gy
Plan Total Prescribed Dose: 50.4 Gy
Reference Point Dosage Given to Date: 37.8 Gy
Reference Point Dosage Given to Date: 37.8 Gy
Reference Point Session Dosage Given: 1.8 Gy
Reference Point Session Dosage Given: 1.8 Gy
Session Number: 21

## 2022-03-24 NOTE — Progress Notes (Signed)
Received call from patient whom is inpatient with concerns regarding financial concerns. Patient is now done with chemo and is in Radiation. Referred her to [facility] in Radiation to complete [employer] process which can help free up some of her money to help take care of medical expenses with her money. Gave her Adrienne's number to call and coordinate.  ? ?Also discussed calling the number on bill to discuss payment arrangements if need be and mentioned possibly applying for Medicaid. ? ?Also mentioned speaking with social workers at [employer] for other possible assistance. ? ?She has my card for any additional financial questions or concerns. ?

## 2022-03-25 ENCOUNTER — Other Ambulatory Visit: Payer: Self-pay

## 2022-03-25 ENCOUNTER — Telehealth: Payer: Self-pay | Admitting: Licensed Clinical Social Worker

## 2022-03-25 ENCOUNTER — Ambulatory Visit
Admission: RE | Admit: 2022-03-25 | Discharge: 2022-03-25 | Disposition: A | Discharge status: Home / Self Care | Payer: Medicare HMO | Source: Ambulatory Visit | Attending: Radiation Oncology | Admitting: Radiation Oncology

## 2022-03-25 ENCOUNTER — Inpatient Hospital Stay: Payer: Medicare HMO | Admitting: Licensed Clinical Social Worker

## 2022-03-25 DIAGNOSIS — C50412 Malignant neoplasm of upper-outer quadrant of left female breast: Secondary | ICD-10-CM | POA: Diagnosis not present

## 2022-03-25 DIAGNOSIS — Z510 Encounter for antineoplastic radiation therapy: Secondary | ICD-10-CM | POA: Diagnosis not present

## 2022-03-25 DIAGNOSIS — Z79899 Other long term (current) drug therapy: Secondary | ICD-10-CM | POA: Insufficient documentation

## 2022-03-25 DIAGNOSIS — R5383 Other fatigue: Secondary | ICD-10-CM | POA: Insufficient documentation

## 2022-03-25 DIAGNOSIS — C50411 Malignant neoplasm of upper-outer quadrant of right female breast: Secondary | ICD-10-CM | POA: Diagnosis not present

## 2022-03-25 DIAGNOSIS — Z79811 Long term (current) use of aromatase inhibitors: Secondary | ICD-10-CM | POA: Insufficient documentation

## 2022-03-25 DIAGNOSIS — Z170 Estrogen receptor positive status [ER+]: Secondary | ICD-10-CM | POA: Diagnosis not present

## 2022-03-25 DIAGNOSIS — R519 Headache, unspecified: Secondary | ICD-10-CM | POA: Insufficient documentation

## 2022-03-25 LAB — RAD ONC ARIA SESSION SUMMARY
Course Elapsed Days: 29
Plan Fractions Treated to Date: 16
Plan Fractions Treated to Date: 22
Plan Prescribed Dose Per Fraction: 1.8 Gy
Plan Prescribed Dose Per Fraction: 1.8 Gy
Plan Total Fractions Prescribed: 22
Plan Total Fractions Prescribed: 28
Plan Total Prescribed Dose: 39.6 Gy
Plan Total Prescribed Dose: 50.4 Gy
Reference Point Dosage Given to Date: 39.6 Gy
Reference Point Dosage Given to Date: 39.6 Gy
Reference Point Session Dosage Given: 1.8 Gy
Reference Point Session Dosage Given: 1.8 Gy
Session Number: 22

## 2022-03-25 NOTE — Progress Notes (Signed)
[facility] ?Clinical Social Work ? ?Clinical Social Work was referred by  financial advocate  for assessment of psychosocial needs.  Clinical Social Worker met with patient  to offer support and assess for needs.   ? ?Pt is stressed about cost of medical bills. She was advised to contact billing to discuss payment plan and to consider applying for Medicaid and has been referred to financial advocates for [employer]. 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  ? ?CSW worked with patient on [employer] [place] applications today. Pt will bring in required supporting documents tomorrow and CSW will then submit applications. ? ?CSW also provided information on support programs through [facility]. ? ? ?Pritika Alvarez E Kristien Salatino, LCSW  ?Clinical Social Worker ?[place] ?      ? ?

## 2022-03-25 NOTE — Telephone Encounter (Signed)
[place] ?Clinical Social Work ? ?Clinical Social Work was referred by  financial advocate  for assessment of psychosocial needs.  Clinical Social Worker  attempted to contact pt by phone   to offer support and assess for needs.  ?No answer. Left VM with direct contact information.  ? ? ? ? ? ?Camaria Gerald E Voncile Schwarz, LCSW  ?Clinical Social Worker ?[place] ?      ? ?

## 2022-03-26 ENCOUNTER — Other Ambulatory Visit: Payer: Self-pay

## 2022-03-26 ENCOUNTER — Ambulatory Visit
Admission: RE | Admit: 2022-03-26 | Discharge: 2022-03-26 | Disposition: A | Discharge status: Home / Self Care | Payer: Medicare HMO | Source: Ambulatory Visit | Attending: Radiation Oncology | Admitting: Radiation Oncology

## 2022-03-26 ENCOUNTER — Encounter: Payer: Self-pay | Admitting: Licensed Clinical Social Worker

## 2022-03-26 DIAGNOSIS — Z170 Estrogen receptor positive status [ER+]: Secondary | ICD-10-CM | POA: Diagnosis not present

## 2022-03-26 DIAGNOSIS — C50411 Malignant neoplasm of upper-outer quadrant of right female breast: Secondary | ICD-10-CM | POA: Diagnosis not present

## 2022-03-26 DIAGNOSIS — Z510 Encounter for antineoplastic radiation therapy: Secondary | ICD-10-CM | POA: Diagnosis not present

## 2022-03-26 DIAGNOSIS — C50412 Malignant neoplasm of upper-outer quadrant of left female breast: Secondary | ICD-10-CM | POA: Diagnosis not present

## 2022-03-26 LAB — RAD ONC ARIA SESSION SUMMARY
Course Elapsed Days: 30
Plan Fractions Treated to Date: 17
Plan Fractions Treated to Date: 23
Plan Prescribed Dose Per Fraction: 1.8 Gy
Plan Prescribed Dose Per Fraction: 1.8 Gy
Plan Total Fractions Prescribed: 22
Plan Total Fractions Prescribed: 28
Plan Total Prescribed Dose: 39.6 Gy
Plan Total Prescribed Dose: 50.4 Gy
Reference Point Dosage Given to Date: 41.4 Gy
Reference Point Dosage Given to Date: 41.4 Gy
Reference Point Session Dosage Given: 1.8 Gy
Reference Point Session Dosage Given: 1.8 Gy
Session Number: 23

## 2022-03-26 NOTE — Progress Notes (Signed)
[facility] CSW Progress Note ? ?Pt brought in supporting documents for applications for assistance. CSW submitted applications for [employer] [place] on pt's behalf. ? ? ? ?Ressie Slevin E Shley Dolby, LCSW  ?

## 2022-03-27 ENCOUNTER — Other Ambulatory Visit: Payer: Self-pay

## 2022-03-27 ENCOUNTER — Ambulatory Visit: Payer: Medicare HMO | Admitting: Radiation Oncology

## 2022-03-27 ENCOUNTER — Ambulatory Visit
Admission: RE | Admit: 2022-03-27 | Discharge: 2022-03-27 | Disposition: A | Discharge status: Home / Self Care | Payer: Medicare HMO | Source: Ambulatory Visit | Attending: Radiation Oncology | Admitting: Radiation Oncology

## 2022-03-27 DIAGNOSIS — C50412 Malignant neoplasm of upper-outer quadrant of left female breast: Secondary | ICD-10-CM | POA: Diagnosis not present

## 2022-03-27 DIAGNOSIS — Z510 Encounter for antineoplastic radiation therapy: Secondary | ICD-10-CM | POA: Diagnosis not present

## 2022-03-27 DIAGNOSIS — C50411 Malignant neoplasm of upper-outer quadrant of right female breast: Secondary | ICD-10-CM | POA: Diagnosis not present

## 2022-03-27 DIAGNOSIS — Z170 Estrogen receptor positive status [ER+]: Secondary | ICD-10-CM | POA: Diagnosis not present

## 2022-03-27 LAB — RAD ONC ARIA SESSION SUMMARY
Course Elapsed Days: 31
Plan Fractions Treated to Date: 18
Plan Fractions Treated to Date: 24
Plan Prescribed Dose Per Fraction: 1.8 Gy
Plan Prescribed Dose Per Fraction: 1.8 Gy
Plan Total Fractions Prescribed: 22
Plan Total Fractions Prescribed: 28
Plan Total Prescribed Dose: 39.6 Gy
Plan Total Prescribed Dose: 50.4 Gy
Reference Point Dosage Given to Date: 43.2 Gy
Reference Point Dosage Given to Date: 43.2 Gy
Reference Point Session Dosage Given: 1.8 Gy
Reference Point Session Dosage Given: 1.8 Gy
Session Number: 24

## 2022-03-29 DIAGNOSIS — C50412 Malignant neoplasm of upper-outer quadrant of left female breast: Secondary | ICD-10-CM | POA: Diagnosis not present

## 2022-03-29 DIAGNOSIS — C50411 Malignant neoplasm of upper-outer quadrant of right female breast: Secondary | ICD-10-CM | POA: Diagnosis not present

## 2022-03-29 DIAGNOSIS — Z510 Encounter for antineoplastic radiation therapy: Secondary | ICD-10-CM | POA: Diagnosis not present

## 2022-03-29 DIAGNOSIS — Z170 Estrogen receptor positive status [ER+]: Secondary | ICD-10-CM | POA: Diagnosis not present

## 2022-03-30 ENCOUNTER — Telehealth: Payer: Self-pay

## 2022-03-30 ENCOUNTER — Other Ambulatory Visit (HOSPITAL_COMMUNITY): Payer: Self-pay

## 2022-03-30 ENCOUNTER — Other Ambulatory Visit: Payer: Self-pay

## 2022-03-30 ENCOUNTER — Inpatient Hospital Stay (HOSPITAL_BASED_OUTPATIENT_CLINIC_OR_DEPARTMENT_OTHER): Payer: Medicare HMO | Admitting: Hematology and Oncology

## 2022-03-30 ENCOUNTER — Ambulatory Visit: Payer: Medicare HMO

## 2022-03-30 ENCOUNTER — Ambulatory Visit
Admission: RE | Admit: 2022-03-30 | Discharge: 2022-03-30 | Disposition: A | Discharge status: Home / Self Care | Payer: Medicare HMO | Source: Ambulatory Visit | Attending: Radiation Oncology | Admitting: Radiation Oncology

## 2022-03-30 ENCOUNTER — Telehealth: Payer: Self-pay | Admitting: Pharmacy Technician

## 2022-03-30 ENCOUNTER — Encounter: Payer: Self-pay | Admitting: Hematology and Oncology

## 2022-03-30 DIAGNOSIS — C50412 Malignant neoplasm of upper-outer quadrant of left female breast: Secondary | ICD-10-CM | POA: Diagnosis not present

## 2022-03-30 DIAGNOSIS — Z170 Estrogen receptor positive status [ER+]: Secondary | ICD-10-CM

## 2022-03-30 DIAGNOSIS — Z510 Encounter for antineoplastic radiation therapy: Secondary | ICD-10-CM | POA: Diagnosis not present

## 2022-03-30 DIAGNOSIS — C50411 Malignant neoplasm of upper-outer quadrant of right female breast: Secondary | ICD-10-CM

## 2022-03-30 LAB — RAD ONC ARIA SESSION SUMMARY
Course Elapsed Days: 34
Plan Fractions Treated to Date: 19
Plan Fractions Treated to Date: 25
Plan Prescribed Dose Per Fraction: 1.8 Gy
Plan Prescribed Dose Per Fraction: 1.8 Gy
Plan Total Fractions Prescribed: 22
Plan Total Fractions Prescribed: 28
Plan Total Prescribed Dose: 39.6 Gy
Plan Total Prescribed Dose: 50.4 Gy
Reference Point Dosage Given to Date: 45 Gy
Reference Point Dosage Given to Date: 45 Gy
Reference Point Session Dosage Given: 1.8 Gy
Reference Point Session Dosage Given: 1.8 Gy
Session Number: 25

## 2022-03-30 MED ORDER — ANASTROZOLE 1 MG PO TABS: 1.0000 mg | ORAL_TABLET | Freq: Every day | ORAL | 3 refills | Status: DC

## 2022-03-30 MED ORDER — ABEMACICLIB 50 MG PO TABS
50.0000 mg | ORAL_TABLET | Freq: Two times a day (BID) | ORAL | 0 refills | Status: DC
  Filled 2022-03-30: qty 70, 35d supply, fill #0

## 2022-03-30 NOTE — Assessment & Plan Note (Addendum)
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, 1.3 cm, with DCIS, margins negative, 0/4 lymph nodes negative, T1 cN0 stage IA pathologic stage, ER 100%, PR 100%, HER-2 negative ratio 1.39, Ki-67 20% ?(Originally 2 nodules were detected on screening mammogram 1.9 cm= fat necrosis and 8 mm IDC) ?Oncotype DX score 20, 13% risk of recurrence, intermediate risk ?Adjuvant radiation therapy started 07/20/2017completed 07/13/2016 ?? ?? ?09/02/2021:Right lumpectomy: Grade 2 ILC 3.1 cm, margins negative, LVI present, 4/6 lymph nodes macrometastases, 2 lymph nodes isolated tumor cells, ER 100%, PR 90%, Ki-67 25%, HER2 negative ?? ?Treatment plan/summary: ?1.?systemic chemotherapy with CMF x6?cycles completed 01/30/2022 ?2.?adjuvant radiation therapy completing 04/09/2022 ?3.??For the continued antiestrogen therapy (previously anastrozole started 07/23/2016 switched to letrozole 2018 switched to tamoxifen 2018, switched back to anastrozole) hair loss was the main problem, may consider exemestane ?------------------------------------------------------------------------------------------------------------------------------- ?10/15/2021: CT CAP: No metastatic disease. ?Descending and sigmoid colon diverticulosis, several tiny pulmonary nodules less than 5 mm, 2.1 cm left thyroid nodule ? ?Treatment plan: We discussed different treatment options and recommended anastrozole with Verzinio ? ?Abemaciclib counseling: I discussed at length the risks and benefits of Abemaciclib in combination with letrozole. Adverse effects of Abemaciclib include decreasing neutrophil count, pneumonia, blood clots in lungs as well as nausea and GI symptoms. Side effects of letrozole include hot flashes, muscle aches and pains, uterine bleeding/spotting/cancer, osteoporosis, risk of blood clots. ? ?Return to clinic to follow-up with Jenny Reichmann ?

## 2022-03-30 NOTE — Telephone Encounter (Signed)
Oral Oncology Pharmacist Encounter  Received new prescription for abemaciclib (Verzenio) for the treatment of hormone receptor positive, HER2 negative, early stage breast cancer in conjunction with anastrozole, planned duration until disease progression or unacceptable toxicity .  Labs from 01/30/2022 assessed, no interventions needed. Prescription dose and frequency assessed.  Current medication list in Epic reviewed, DDIs with verzenio identified: none  Evaluated chart and no patient barriers to medication adherence noted.   Patient agreement for treatment documented in MD note on 03/30/2022.  Prescription has been e-scribed to the [facility] for benefits analysis and approval.  Oral Oncology Clinic will continue to follow for insurance authorization, copayment issues, initial counseling and start date.  Drema Halon, PharmD Hematology/Oncology Clinical Pharmacist [facility] [phone] 03/30/2022 2:49 PM

## 2022-03-30 NOTE — Telephone Encounter (Signed)
Oral Oncology Patient Advocate Encounter ?  ?Received notification that prior authorization for Verzenio is required. ?  ?PA submitted on 03/30/2022 ?Key [id number] ?Status is pending ? ? ?Lady Deutscher, CPhT-Adv ?Pharmacy Patient Advocate Specialist ?[place] [facility] Patient Advocate Team ?Direct Number: [phone]  Fax: [phone] ? ?

## 2022-03-31 ENCOUNTER — Ambulatory Visit: Admission: RE | Admit: 2022-03-31 | Payer: Medicare HMO | Source: Ambulatory Visit | Admitting: Radiation Oncology

## 2022-03-31 ENCOUNTER — Ambulatory Visit
Admission: RE | Admit: 2022-03-31 | Discharge: 2022-03-31 | Disposition: A | Discharge status: Home / Self Care | Payer: Medicare HMO | Source: Ambulatory Visit | Attending: Radiation Oncology | Admitting: Radiation Oncology

## 2022-03-31 ENCOUNTER — Other Ambulatory Visit: Payer: Self-pay

## 2022-03-31 ENCOUNTER — Other Ambulatory Visit (HOSPITAL_COMMUNITY): Payer: Self-pay

## 2022-03-31 ENCOUNTER — Telehealth: Payer: Self-pay | Admitting: Hematology and Oncology

## 2022-03-31 DIAGNOSIS — C50411 Malignant neoplasm of upper-outer quadrant of right female breast: Secondary | ICD-10-CM | POA: Diagnosis not present

## 2022-03-31 DIAGNOSIS — Z170 Estrogen receptor positive status [ER+]: Secondary | ICD-10-CM | POA: Diagnosis not present

## 2022-03-31 DIAGNOSIS — C50412 Malignant neoplasm of upper-outer quadrant of left female breast: Secondary | ICD-10-CM | POA: Diagnosis not present

## 2022-03-31 DIAGNOSIS — Z510 Encounter for antineoplastic radiation therapy: Secondary | ICD-10-CM | POA: Diagnosis not present

## 2022-03-31 LAB — RAD ONC ARIA SESSION SUMMARY
Course Elapsed Days: 35
Plan Fractions Treated to Date: 20
Plan Fractions Treated to Date: 26
Plan Prescribed Dose Per Fraction: 1.8 Gy
Plan Prescribed Dose Per Fraction: 1.8 Gy
Plan Total Fractions Prescribed: 22
Plan Total Fractions Prescribed: 28
Plan Total Prescribed Dose: 39.6 Gy
Plan Total Prescribed Dose: 50.4 Gy
Reference Point Dosage Given to Date: 46.8 Gy
Reference Point Dosage Given to Date: 46.8 Gy
Reference Point Session Dosage Given: 1.8 Gy
Reference Point Session Dosage Given: 1.8 Gy
Session Number: 26

## 2022-03-31 NOTE — Telephone Encounter (Signed)
Oral Oncology Patient Advocate Encounter ? ?Prior Authorization for Michele Page has been approved.   ? ?PA# [id number] ?Effective dates: 03/31/2022 through 09/27/2022 ? ?Patients co-pay is $3,207.35.  ? ? ? ?Michele Page, CPhT-Adv ?Pharmacy Patient Advocate Specialist ?[facility] Patient Advocate Team ?Direct Number: [phone]  Fax: [phone] ? ?

## 2022-03-31 NOTE — Telephone Encounter (Signed)
Scheduled appointment per 5/15 los. Left message. ?

## 2022-04-01 ENCOUNTER — Other Ambulatory Visit: Payer: Self-pay

## 2022-04-01 ENCOUNTER — Ambulatory Visit
Admission: RE | Admit: 2022-04-01 | Discharge: 2022-04-01 | Disposition: A | Discharge status: Home / Self Care | Payer: Medicare HMO | Source: Ambulatory Visit | Attending: Radiation Oncology | Admitting: Radiation Oncology

## 2022-04-01 DIAGNOSIS — C50412 Malignant neoplasm of upper-outer quadrant of left female breast: Secondary | ICD-10-CM | POA: Diagnosis not present

## 2022-04-01 DIAGNOSIS — C50411 Malignant neoplasm of upper-outer quadrant of right female breast: Secondary | ICD-10-CM | POA: Diagnosis not present

## 2022-04-01 DIAGNOSIS — Z170 Estrogen receptor positive status [ER+]: Secondary | ICD-10-CM | POA: Diagnosis not present

## 2022-04-01 DIAGNOSIS — Z510 Encounter for antineoplastic radiation therapy: Secondary | ICD-10-CM | POA: Diagnosis not present

## 2022-04-01 LAB — RAD ONC ARIA SESSION SUMMARY
Course Elapsed Days: 36
Plan Fractions Treated to Date: 21
Plan Fractions Treated to Date: 27
Plan Prescribed Dose Per Fraction: 1.8 Gy
Plan Prescribed Dose Per Fraction: 1.8 Gy
Plan Total Fractions Prescribed: 22
Plan Total Fractions Prescribed: 28
Plan Total Prescribed Dose: 39.6 Gy
Plan Total Prescribed Dose: 50.4 Gy
Reference Point Dosage Given to Date: 48.6 Gy
Reference Point Dosage Given to Date: 48.6 Gy
Reference Point Session Dosage Given: 1.8 Gy
Reference Point Session Dosage Given: 1.8 Gy
Session Number: 27

## 2022-04-02 ENCOUNTER — Ambulatory Visit
Admission: RE | Admit: 2022-04-02 | Discharge: 2022-04-02 | Disposition: A | Discharge status: Home / Self Care | Payer: Medicare HMO | Source: Ambulatory Visit | Attending: Radiation Oncology | Admitting: Radiation Oncology

## 2022-04-02 ENCOUNTER — Other Ambulatory Visit: Payer: Self-pay

## 2022-04-02 DIAGNOSIS — C50412 Malignant neoplasm of upper-outer quadrant of left female breast: Secondary | ICD-10-CM | POA: Diagnosis not present

## 2022-04-02 DIAGNOSIS — C50411 Malignant neoplasm of upper-outer quadrant of right female breast: Secondary | ICD-10-CM | POA: Diagnosis not present

## 2022-04-02 DIAGNOSIS — Z510 Encounter for antineoplastic radiation therapy: Secondary | ICD-10-CM | POA: Diagnosis not present

## 2022-04-02 DIAGNOSIS — Z170 Estrogen receptor positive status [ER+]: Secondary | ICD-10-CM | POA: Diagnosis not present

## 2022-04-02 LAB — RAD ONC ARIA SESSION SUMMARY
Course Elapsed Days: 37
Plan Fractions Treated to Date: 22
Plan Fractions Treated to Date: 28
Plan Prescribed Dose Per Fraction: 1.8 Gy
Plan Prescribed Dose Per Fraction: 1.8 Gy
Plan Total Fractions Prescribed: 22
Plan Total Fractions Prescribed: 28
Plan Total Prescribed Dose: 39.6 Gy
Plan Total Prescribed Dose: 50.4 Gy
Reference Point Dosage Given to Date: 50.4 Gy
Reference Point Dosage Given to Date: 50.4 Gy
Reference Point Session Dosage Given: 1.8 Gy
Reference Point Session Dosage Given: 1.8 Gy
Session Number: 28

## 2022-04-03 ENCOUNTER — Ambulatory Visit
Admission: RE | Admit: 2022-04-03 | Discharge: 2022-04-03 | Disposition: A | Discharge status: Home / Self Care | Payer: Medicare HMO | Source: Ambulatory Visit | Attending: Radiation Oncology | Admitting: Radiation Oncology

## 2022-04-03 ENCOUNTER — Other Ambulatory Visit: Payer: Self-pay

## 2022-04-03 ENCOUNTER — Telehealth: Payer: Self-pay | Admitting: Pharmacy Technician

## 2022-04-03 ENCOUNTER — Encounter: Payer: Self-pay | Admitting: Licensed Clinical Social Worker

## 2022-04-03 DIAGNOSIS — Z170 Estrogen receptor positive status [ER+]: Secondary | ICD-10-CM | POA: Diagnosis not present

## 2022-04-03 DIAGNOSIS — Z510 Encounter for antineoplastic radiation therapy: Secondary | ICD-10-CM | POA: Diagnosis not present

## 2022-04-03 DIAGNOSIS — C50411 Malignant neoplasm of upper-outer quadrant of right female breast: Secondary | ICD-10-CM | POA: Diagnosis not present

## 2022-04-03 LAB — RAD ONC ARIA SESSION SUMMARY
Course Elapsed Days: 38
Plan Fractions Treated to Date: 1
Plan Prescribed Dose Per Fraction: 2 Gy
Plan Total Fractions Prescribed: 5
Plan Total Prescribed Dose: 10 Gy
Reference Point Dosage Given to Date: 52.4 Gy
Reference Point Session Dosage Given: 2 Gy
Session Number: 29

## 2022-04-03 NOTE — Telephone Encounter (Signed)
Oral Oncology Patient Advocate Encounter   Submitted application for assistance for Verzenio to the [employer].  [employer] phone number [phone]   Will continue to follow until final determination.   Lady Deutscher, CPhT-Adv Pharmacy Patient Advocate Specialist [place] Patient Advocate Team Direct Number: [phone]  Fax: [phone]

## 2022-04-03 NOTE — Progress Notes (Signed)
[facility] [place] CSW Progress Note  Clinical Education officer, museum faxed pt bills to [employer] in [place] per pt's request. [employer] [facility] [place] will communicate with pt regarding payment.    Markel Mergenthaler E Luisana Lutzke, LCSW

## 2022-04-06 ENCOUNTER — Other Ambulatory Visit (HOSPITAL_COMMUNITY): Payer: Self-pay

## 2022-04-06 ENCOUNTER — Ambulatory Visit
Admission: RE | Admit: 2022-04-06 | Discharge: 2022-04-06 | Disposition: A | Discharge status: Home / Self Care | Payer: Medicare HMO | Source: Ambulatory Visit | Attending: Radiation Oncology | Admitting: Radiation Oncology

## 2022-04-06 ENCOUNTER — Other Ambulatory Visit: Payer: Self-pay

## 2022-04-06 DIAGNOSIS — C50411 Malignant neoplasm of upper-outer quadrant of right female breast: Secondary | ICD-10-CM | POA: Diagnosis not present

## 2022-04-06 DIAGNOSIS — Z170 Estrogen receptor positive status [ER+]: Secondary | ICD-10-CM | POA: Diagnosis not present

## 2022-04-06 DIAGNOSIS — Z510 Encounter for antineoplastic radiation therapy: Secondary | ICD-10-CM | POA: Diagnosis not present

## 2022-04-06 LAB — RAD ONC ARIA SESSION SUMMARY
Course Elapsed Days: 41
Plan Fractions Treated to Date: 2
Plan Prescribed Dose Per Fraction: 2 Gy
Plan Total Fractions Prescribed: 5
Plan Total Prescribed Dose: 10 Gy
Reference Point Dosage Given to Date: 54.4 Gy
Reference Point Session Dosage Given: 2 Gy
Session Number: 30

## 2022-04-06 NOTE — Telephone Encounter (Signed)
Oral Oncology Patient Advocate Encounter  Received notification from [facility] that patient has been successfully enrolled into their program to receive Verzenio from the manufacturer at $0 out of pocket until 11/15/2022  I called and left a message with the patient.   [facility] will dispense medication [phone]).   Lady Deutscher, CPhT-Adv Pharmacy Patient Advocate Specialist [place] Patient Advocate Team Direct Number: [phone]  Fax: [phone]

## 2022-04-07 ENCOUNTER — Ambulatory Visit
Admission: RE | Admit: 2022-04-07 | Discharge: 2022-04-07 | Disposition: A | Discharge status: Home / Self Care | Payer: Medicare HMO | Source: Ambulatory Visit | Attending: Radiation Oncology | Admitting: Radiation Oncology

## 2022-04-07 ENCOUNTER — Other Ambulatory Visit: Payer: Self-pay

## 2022-04-07 DIAGNOSIS — C50412 Malignant neoplasm of upper-outer quadrant of left female breast: Secondary | ICD-10-CM | POA: Diagnosis not present

## 2022-04-07 DIAGNOSIS — Z510 Encounter for antineoplastic radiation therapy: Secondary | ICD-10-CM | POA: Diagnosis not present

## 2022-04-07 DIAGNOSIS — C50411 Malignant neoplasm of upper-outer quadrant of right female breast: Secondary | ICD-10-CM | POA: Diagnosis not present

## 2022-04-07 DIAGNOSIS — Z170 Estrogen receptor positive status [ER+]: Secondary | ICD-10-CM | POA: Diagnosis not present

## 2022-04-07 LAB — RAD ONC ARIA SESSION SUMMARY
Course Elapsed Days: 42
Plan Fractions Treated to Date: 3
Plan Prescribed Dose Per Fraction: 2 Gy
Plan Total Fractions Prescribed: 5
Plan Total Prescribed Dose: 10 Gy
Reference Point Dosage Given to Date: 56.4 Gy
Reference Point Session Dosage Given: 2 Gy
Session Number: 31

## 2022-04-08 ENCOUNTER — Ambulatory Visit
Admission: RE | Admit: 2022-04-08 | Discharge: 2022-04-08 | Disposition: A | Discharge status: Home / Self Care | Payer: Medicare HMO | Source: Ambulatory Visit | Attending: Radiation Oncology | Admitting: Radiation Oncology

## 2022-04-08 ENCOUNTER — Other Ambulatory Visit: Payer: Self-pay

## 2022-04-08 DIAGNOSIS — C50411 Malignant neoplasm of upper-outer quadrant of right female breast: Secondary | ICD-10-CM | POA: Diagnosis not present

## 2022-04-08 DIAGNOSIS — Z170 Estrogen receptor positive status [ER+]: Secondary | ICD-10-CM | POA: Diagnosis not present

## 2022-04-08 DIAGNOSIS — Z510 Encounter for antineoplastic radiation therapy: Secondary | ICD-10-CM | POA: Diagnosis not present

## 2022-04-08 LAB — RAD ONC ARIA SESSION SUMMARY
Course Elapsed Days: 43
Plan Fractions Treated to Date: 4
Plan Prescribed Dose Per Fraction: 2 Gy
Plan Total Fractions Prescribed: 5
Plan Total Prescribed Dose: 10 Gy
Reference Point Dosage Given to Date: 58.4 Gy
Reference Point Session Dosage Given: 2 Gy
Session Number: 32

## 2022-04-09 ENCOUNTER — Ambulatory Visit
Admission: RE | Admit: 2022-04-09 | Discharge: 2022-04-09 | Disposition: A | Discharge status: Home / Self Care | Payer: Medicare HMO | Source: Ambulatory Visit | Attending: Radiation Oncology | Admitting: Radiation Oncology

## 2022-04-09 ENCOUNTER — Other Ambulatory Visit: Payer: Self-pay

## 2022-04-09 ENCOUNTER — Encounter: Payer: Self-pay | Admitting: *Deleted

## 2022-04-09 ENCOUNTER — Encounter: Payer: Self-pay | Admitting: Radiation Oncology

## 2022-04-09 DIAGNOSIS — C50411 Malignant neoplasm of upper-outer quadrant of right female breast: Secondary | ICD-10-CM | POA: Diagnosis not present

## 2022-04-09 DIAGNOSIS — Z170 Estrogen receptor positive status [ER+]: Secondary | ICD-10-CM | POA: Diagnosis not present

## 2022-04-09 DIAGNOSIS — Z510 Encounter for antineoplastic radiation therapy: Secondary | ICD-10-CM | POA: Diagnosis not present

## 2022-04-09 LAB — RAD ONC ARIA SESSION SUMMARY
Course Elapsed Days: 44
Plan Fractions Treated to Date: 5
Plan Prescribed Dose Per Fraction: 2 Gy
Plan Total Fractions Prescribed: 5
Plan Total Prescribed Dose: 10 Gy
Reference Point Dosage Given to Date: 60.4 Gy
Reference Point Session Dosage Given: 2 Gy
Session Number: 33

## 2022-04-14 NOTE — Progress Notes (Signed)
                                                                                                                                                             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  Intent: Curative  Radiation Treatment Dates: 02/24/2022 through 04/09/2022 Site Technique Total Dose (Gy) Dose per Fx (Gy) Completed Fx Beam Energies  Breast, Right: Breast_R 3D 49.59/49.59 1.8 28/28 10X  Breast, Right: Breast_R_SCLV 3D 50.4/50.4 1.8 28/28 6X, 10X  Breast, Right: Breast_R_Bst specialPort 10/10 2 5/5 12E   Narrative: The patient tolerated radiation therapy relatively well. She developed anticipated skin changes in the treatment field.   Plan: The patient will receive a call in about one month from the radiation oncology department. She will continue follow up with Dr. Lindi Adie as well.   ________________________________________________    Carola Rhine, [facility]

## 2022-04-16 NOTE — Telephone Encounter (Signed)
Oral Chemotherapy Pharmacist Encounter  I spoke with patient for overview of: Verzenio for the treatment of early stage, hormone-receptor positive breast cancer, in combination with anastrozole, planned duration for 2 years or until disease progression or unacceptable toxicity.   Counseled patient on administration, dosing, side effects, monitoring, drug-food interactions, safe handling, storage, and disposal.  Patient will take Verzenio '50mg'$  tablets, 1 tablet by mouth twice daily without regard to food.  Patient knows to avoid grapefruit and grapefruit juice.  Verzenio start date: 04/16/22  Adverse effects include but are not limited to: diarrhea, fatigue, nausea, abdominal pain, decreased blood counts, and increased liver function tests, and joint pains. Severe, life-threatening, and/or fatal interstitial lung disease (ILD) and/or pneumonitis may occur with CDK 4/6 inhibitors.  Patient has anti-emetic on hand and knows to take it if nausea develops.   Patient will obtain anti diarrheal and alert the office of 4 or more loose stools above baseline.  Reviewed with patient importance of keeping a medication schedule and plan for any missed doses. No barriers to medication adherence identified.  Medication reconciliation performed and medication/allergy list updated.  Insurance authorization for [employer] has been obtained. Patient receives medication from [place] to receive free medication.   Patient informed the pharmacy will reach out 5-7 days prior to needing next fill of Verzenio to coordinate continued medication acquisition to prevent break in therapy.  All questions answered.  Michele Page voiced understanding and appreciation.   Medication education handout placed in mail for patient. Patient knows to call the office with questions or concerns. Oral Chemotherapy Clinic phone number provided to patient.   Drema Halon, PharmD Hematology/Oncology Clinical  Pharmacist [facility] [phone] 04/16/2022   1:53 PM

## 2022-04-20 ENCOUNTER — Encounter: Payer: Self-pay | Admitting: Hematology and Oncology

## 2022-04-29 ENCOUNTER — Other Ambulatory Visit: Payer: Self-pay | Admitting: *Deleted

## 2022-04-29 DIAGNOSIS — Z170 Estrogen receptor positive status [ER+]: Secondary | ICD-10-CM

## 2022-04-30 ENCOUNTER — Inpatient Hospital Stay: Payer: Medicare HMO | Attending: Hematology and Oncology

## 2022-04-30 ENCOUNTER — Inpatient Hospital Stay: Payer: Medicare HMO | Admitting: Pharmacist

## 2022-04-30 ENCOUNTER — Other Ambulatory Visit: Payer: Self-pay

## 2022-04-30 ENCOUNTER — Telehealth: Payer: Self-pay

## 2022-04-30 VITALS — BP 152/85 | HR 75 | Temp 97.5°F | Resp 16 | Ht 62.0 in | Wt 201.0 lb

## 2022-04-30 DIAGNOSIS — Z79899 Other long term (current) drug therapy: Secondary | ICD-10-CM | POA: Insufficient documentation

## 2022-04-30 DIAGNOSIS — Z170 Estrogen receptor positive status [ER+]: Secondary | ICD-10-CM | POA: Insufficient documentation

## 2022-04-30 DIAGNOSIS — C50412 Malignant neoplasm of upper-outer quadrant of left female breast: Secondary | ICD-10-CM | POA: Diagnosis not present

## 2022-04-30 DIAGNOSIS — Z79811 Long term (current) use of aromatase inhibitors: Secondary | ICD-10-CM | POA: Diagnosis not present

## 2022-04-30 DIAGNOSIS — Z95828 Presence of other vascular implants and grafts: Secondary | ICD-10-CM

## 2022-04-30 LAB — CBC WITH DIFFERENTIAL (CANCER CENTER ONLY)
Abs Immature Granulocytes: 0.01 10*3/uL (ref 0.00–0.07)
Basophils Absolute: 0 10*3/uL (ref 0.0–0.1)
Basophils Relative: 1 %
Eosinophils Absolute: 0.1 10*3/uL (ref 0.0–0.5)
Eosinophils Relative: 2 %
HCT: 31.9 % — ABNORMAL LOW (ref 36.0–46.0)
Hemoglobin: 11.1 g/dL — ABNORMAL LOW (ref 12.0–15.0)
Immature Granulocytes: 0 %
Lymphocytes Relative: 17 %
Lymphs Abs: 0.6 10*3/uL — ABNORMAL LOW (ref 0.7–4.0)
MCH: 32.6 pg (ref 26.0–34.0)
MCHC: 34.8 g/dL (ref 30.0–36.0)
MCV: 93.8 fL (ref 80.0–100.0)
Monocytes Absolute: 0.3 10*3/uL (ref 0.1–1.0)
Monocytes Relative: 8 %
Neutro Abs: 2.6 10*3/uL (ref 1.7–7.7)
Neutrophils Relative %: 72 %
Platelet Count: 272 10*3/uL (ref 150–400)
RBC: 3.4 MIL/uL — ABNORMAL LOW (ref 3.87–5.11)
RDW: 13 % (ref 11.5–15.5)
WBC Count: 3.6 10*3/uL — ABNORMAL LOW (ref 4.0–10.5)
nRBC: 0 % (ref 0.0–0.2)

## 2022-04-30 LAB — CMP (CANCER CENTER ONLY)
ALT: 18 U/L (ref 0–44)
AST: 22 U/L (ref 15–41)
Albumin: 4.4 g/dL (ref 3.5–5.0)
Alkaline Phosphatase: 81 U/L (ref 38–126)
Anion gap: 7 (ref 5–15)
BUN: 24 mg/dL — ABNORMAL HIGH (ref 8–23)
CO2: 27 mmol/L (ref 22–32)
Calcium: 9.7 mg/dL (ref 8.9–10.3)
Chloride: 108 mmol/L (ref 98–111)
Creatinine: 1.97 mg/dL — ABNORMAL HIGH (ref 0.44–1.00)
GFR, Estimated: 28 mL/min — ABNORMAL LOW (ref >60)
Glucose, Bld: 134 mg/dL — ABNORMAL HIGH (ref 70–99)
Potassium: 3.8 mmol/L (ref 3.5–5.1)
Sodium: 142 mmol/L (ref 135–145)
Total Bilirubin: 0.5 mg/dL (ref 0.3–1.2)
Total Protein: 7 g/dL (ref 6.5–8.1)

## 2022-04-30 MED ORDER — HEPARIN SOD (PORK) LOCK FLUSH 100 UNIT/ML IV SOLN
500.0000 [IU] | Freq: Once | INTRAVENOUS | Status: AC
  Administered 2022-04-30: 500 [IU]

## 2022-04-30 MED ORDER — SODIUM CHLORIDE 0.9% FLUSH
10.0000 mL | Freq: Once | INTRAVENOUS | Status: AC
  Administered 2022-04-30: 10 mL

## 2022-04-30 NOTE — Telephone Encounter (Signed)
LVM for pt regarding appt with the Survivorship Program.  Informed patient of callback number [phone].

## 2022-04-30 NOTE — Progress Notes (Signed)
[facility]       Telephone: [phone]?Fax: [phone]   Oncology Clinical Pharmacist Practitioner Initial Assessment  Michele Page is a 65 y.o. female with a diagnosis of breast cancer. They were contacted today via in-person visit.  Indication/Regimen Abemaciclib (Verzenio) is being used appropriately for treatment of breast cancer by Dr. Vinay Gudena.      Wt Readings from Last 1 Encounters:  04/30/22 201 lb (91.2 kg)    Estimated body surface area is 2 meters squared as calculated from the following:   Height as of this encounter: 5' 2" (1.575 m).   Weight as of this encounter: 201 lb (91.2 kg).  The dosing regimen is 50 mg by mouth every 12 hours on days 1 to 28 of a 28-day cycle. This is being given  in combination with anastrozole which was initially started on 07/23/16 . It is planned to continue until disease progression or unacceptable toxicity. Clinical pharmacy met with Michele Page after being referred to Dr. Gudena for management of her abemaciclib. She started abemaciclib on 04/16/22 and has been on/off anastrozole per her report since 07/23/16.  Today we went over potential side effects of abemaciclib which include but are not  limited to: diarrhea, neutropenia, ILD, liver toxicity, blood clots, fatigue, alopecia, nausea, vomiting, and taste alterations.  We also reviewed storage and handling, what to do if a dose is missed, and reviewed her current medication list. She was taking her abemaciclib at 7a and 11p and we instructed her today to make the doses closer to 12 hours apart.   Her baseline Scr is estimated at 1.8 mg/dL on 01/30/22. Today it is 1.97 mg/dL. We reviewed the importance of drinking plenty of fluids and she did admit that she has not been drinking enough water.  We went over some different options today and how to monitor urine output. She has been having some mild nausea but is not interested in anti-emetics at this time.  She said she would call  the clinic should the need arise for these agents. Her blood pressure was a little elevated today and she did state she had just taken her blood pressure medications before she came in to get her vitals.  She follows with Jessica Eubanks NP for this condition and will be seeing her tomorrow.  Michele Page states that her blood pressure readings at home have been normal.    Dose Modifications Per Dr. Gudena, starting abemaciclib at 50 mg every 12 hours and will titrate dose upwards as tolerated  Access Assessment Michele Page will be receiving abemaciclib through [facility] pharmacy.  We instructed her that if she is about a week and a half out and has not heard from them to give them a call.  She does have the number. Insurance Concerns: No Start date if known: 04/16/22  Allergies No Known Allergies  Vitals    04/30/2022    2:00 PM 03/30/2022    1:42 PM 01/30/2022   12:25 PM  Vitals with BMI  Height 5' 2" 5' 2"   Weight 201 lbs 197 lbs 14 oz 199 lbs 6 oz  BMI 36.75 36.19 36.46  Systolic 152 143 165  Diastolic 85 83 76  Pulse 75 68 73     Laboratory Data    Latest Ref Rng & Units 04/30/2022   12:32 PM 01/30/2022   11:34 AM 01/08/2022    1:05 PM  CBC EXTENDED  WBC 4.0 - 10.5   K/uL 3.6  3.8  3.0   RBC 3.87 - 5.11 MIL/uL 3.40  3.61  3.50   Hemoglobin 12.0 - 15.0 g/dL 11.1  11.4  10.9   HCT 36.0 - 46.0 % 31.9  33.3  32.5   Platelets 150 - 400 K/uL 272  326  315   NEUT# 1.7 - 7.7 K/uL 2.6  1.8  0.9   Lymph# 0.7 - 4.0 K/uL 0.6  1.1  1.3        Latest Ref Rng & Units 04/30/2022   12:32 PM 01/30/2022   11:34 AM 01/08/2022    1:05 PM  CMP  Glucose 70 - 99 mg/dL 134  150  162   BUN 8 - 23 mg/dL 24  24  23   Creatinine 0.44 - 1.00 mg/dL 1.97  1.80  1.48   Sodium 135 - 145 mmol/L 142  141  141   Potassium 3.5 - 5.1 mmol/L 3.8  3.5  3.6   Chloride 98 - 111 mmol/L 108  106  107   CO2 22 - 32 mmol/L 27  28  29   Calcium 8.9 - 10.3 mg/dL 9.7  9.6  9.4   Total Protein 6.5 - 8.1 g/dL 7.0   7.1  6.7   Total Bilirubin 0.3 - 1.2 mg/dL 0.5  0.4  0.5   Alkaline Phos 38 - 126 U/L 81  100  90   AST 15 - 41 U/L 22  18  26   ALT 0 - 44 U/L 18  16  25    No results found for: "MG"   Contraindications Contraindications were reviewed?  Yes Contraindications to therapy were identified?  No  Safety Precautions The following safety precautions for the use of abemaciclib were reviewed:  Diarrhea: we reviewed that diarrhea is common with abemaciclib and confirmed that she does have loperamide (Imodium) at home.  We reviewed how to take this medication PRN and gave her information on abemaciclib Neutropenia: we discussed the importance of having a thermometer and what the Centers for Disease Control and Prevention (CDC) considers a fever which is 100.4F (38C) or higher.  Gave patient 24/7 triage line to call if any fevers or symptoms ILD/Pneumonitis: we reviewed potential symptoms including cough, shortness, and fatigue. Hepatotoxicity: reviewed to contact clinic for RUQ pain that will not subside, yellowing of eyes/skin Venous thromboembolism (VTE): reviewed signs of deep vein thrombosis (DVT) such as leg swelling, redness, pain, or tenderness and signs of pulmonary embolism (PE) such as shortness of breath, rapid or irregular heartbeat, cough, chest pain, or lightheadedness Reviewed to take the medication every 12 hours (with food sometimes can be easier on the stomach) and to take it at the same time every day. Discussed proper storage and handling of abemaciclib  Medication Reconciliation Current Outpatient Medications  Medication Sig Dispense Refill   abemaciclib (VERZENIO) 50 MG tablet Take 1 tablet (50 mg total) by mouth 2 (two) times daily. Swallow tablets whole. Do not chew, crush, or split tablets before swallowing. 60 tablet 0   anastrozole (ARIMIDEX) 1 MG tablet Take 1 tablet (1 mg total) by mouth daily. 90 tablet 3   aspirin EC 81 MG tablet Take 1 tablet (81 mg total) by mouth  daily. 30 tablet 6   atorvastatin (LIPITOR) 20 MG tablet TAKE 1 TABLET EVERY DAY 90 tablet 1   labetalol (NORMODYNE) 300 MG tablet TAKE 2 TABLETS THREE TIMES DAILY 540 tablet 2   NIFEdipine (PROCARDIA XL/NIFEDICAL XL) 60 MG   24 hr tablet Take 1 tablet (60 mg total) by mouth 2 (two) times daily. 180 tablet 1   eszopiclone (LUNESTA) 2 MG TABS tablet Take one tablet by mouth at bedtime as needed for sleep (take immediately before bedtime) (Patient not taking: Reported on 04/30/2022) 30 tablet 0   No current facility-administered medications for this visit.    Medication reconciliation is based on the patient's most recent medication list in the electronic medical record (EMR) including herbal products and OTC medications.   The patient's medication list was reviewed today with the patient?  Yes  Drug-drug interactions (DDIs) DDIs were evaluated?  Yes Significant DDIs identified?  No  Drug-Food Interactions Drug-food interactions were evaluated?  Yes Drug-food interactions identified?  Yes, avoid grapefruit products.  Follow-up Plan  Continue abemaciclib 50 mg every 12 hours.  Consider a dose increase in 2 weeks or 4 weeks based on patient preference and toxicity. Labs, pharmacy clinic visit, in 2 weeks Labs, Dr. Gudena visit, in 4 weeks (patient preference)  Michele Page participated in the discussion, expressed understanding, and voiced agreement with the above plan. All questions were answered to her satisfaction. The patient was advised to contact the clinic at [phone] with any questions or concerns prior to her return visit.   I spent 60 minutes assessing the patient.   A , RPH-CPP, 04/30/2022 3:19 PM  **Disclaimer: This note was dictated with voice recognition software. Similar sounding words can inadvertently be transcribed and this note may contain transcription errors which may not have been corrected upon publication of note.**  

## 2022-05-01 ENCOUNTER — Telehealth: Payer: Self-pay

## 2022-05-01 ENCOUNTER — Encounter: Payer: Self-pay | Admitting: Nurse Practitioner

## 2022-05-01 ENCOUNTER — Ambulatory Visit (INDEPENDENT_AMBULATORY_CARE_PROVIDER_SITE_OTHER): Payer: Medicare HMO | Admitting: Nurse Practitioner

## 2022-05-01 VITALS — BP 126/78 | HR 78 | Temp 96.4°F | Ht 62.0 in | Wt 203.6 lb

## 2022-05-01 DIAGNOSIS — E782 Mixed hyperlipidemia: Secondary | ICD-10-CM | POA: Diagnosis not present

## 2022-05-01 DIAGNOSIS — Z6837 Body mass index (BMI) 37.0-37.9, adult: Secondary | ICD-10-CM

## 2022-05-01 DIAGNOSIS — L80 Vitiligo: Secondary | ICD-10-CM | POA: Diagnosis not present

## 2022-05-01 DIAGNOSIS — N1832 Chronic kidney disease, stage 3b: Secondary | ICD-10-CM

## 2022-05-01 DIAGNOSIS — I10 Essential (primary) hypertension: Secondary | ICD-10-CM

## 2022-05-01 DIAGNOSIS — F5101 Primary insomnia: Secondary | ICD-10-CM | POA: Diagnosis not present

## 2022-05-01 MED ORDER — ESZOPICLONE 2 MG PO TABS: ORAL_TABLET | ORAL | 0 refills | Status: DC

## 2022-05-01 NOTE — Telephone Encounter (Signed)
-----   Message from [name], NP sent at 05/01/2022  1:51 PM EDT ----- Regarding: follow up lab Can you call ms Decaprio and let her know I want her to work on her hydration status over the next few weeks and come back into office in ~3 weeks to recheck her kidney function. (Please set her up with lab appt)

## 2022-05-01 NOTE — Telephone Encounter (Signed)
Patient called the office back and advice was discussed. Patient states that she has other appointments and pushed lab until July 21st, 2023. Message routed back to PCP Dewaine Oats Carlos American, NP .

## 2022-05-01 NOTE — Progress Notes (Unsigned)
Careteam: Patient Care Team: [name], NP as PCP - General (Geriatric Medicine) Lorretta Harp, MD as PCP - Cardiology (Cardiology) Nicholas Lose, MD as Consulting Physician (Oncology) Erroll Luna, MD as Consulting Physician (General Surgery)  PLACE OF SERVICE:  [facility] Directive information Does Patient Have a Medical Advance Directive?: No, Would patient like information on creating a medical advance directive?: No - Patient declined  No Known Allergies  Chief Complaint  Patient presents with   Medical Management of Chronic Issues    6 month follow up/Update Contract. Patient would like her left hand discoloration looked at.      HPI: Patient is a 66 y.o. female for routine follow up  Breast cancer- has completed chemo and radiation and surgery  Insomnia- needs refill on lunesta- has not had refill recently and needs this.   Has white area at base of thumb, no pain, itching.   Continues on lipitor  Review of Systems:  Review of Systems  Constitutional:  Negative for chills, fever and weight loss.  HENT:  Negative for tinnitus.   Respiratory:  Negative for cough, sputum production and shortness of breath.   Cardiovascular:  Negative for chest pain, palpitations and leg swelling.  Gastrointestinal:  Negative for abdominal pain, constipation, diarrhea and heartburn.  Genitourinary:  Negative for dysuria, frequency and urgency.  Musculoskeletal:  Negative for back pain, falls, joint pain and myalgias.  Skin: Negative.   Neurological:  Negative for dizziness and headaches.  Psychiatric/Behavioral:  Negative for depression and memory loss. The patient does not have insomnia.     Past Medical History:  Diagnosis Date   Anemia    past hx many yrs ago    Arthritis    lt knee   Breast cancer ([place])    Breast cancer of upper-outer quadrant of left female breast ([place]) 04/02/2016   Chronic kidney disease    CKD   Colon polyps    COPD  (chronic obstructive pulmonary disease) ([name])    GERD (gastroesophageal reflux disease)    years ago- not current    Hyperlipidemia    Hypertension    Multiple sclerosis ([place])    Personal history of chemotherapy    Personal history of radiation therapy    Stroke ([place])    2013, mild cognitive deficits   Vision abnormalities    Past Surgical History:  Procedure Laterality Date   BREAST BIOPSY Left 03/31/2016   BREAST BIOPSY Right 07/24/2021   BREAST LUMPECTOMY Left 04/17/2016   BREAST LUMPECTOMY WITH RADIOACTIVE SEED AND SENTINEL LYMPH NODE BIOPSY Right 09/02/2021   Procedure: RIGHT BREAST LUMPECTOMY WITH RADIOACTIVE SEED AND SENTINEL LYMPH NODE BIOPSY;  Surgeon: Erroll Luna, MD;  Location: [facility];  Service: General;  Laterality: Right;   COLONOSCOPY     HYSTEROSCOPY WITH D & C N/A 12/19/2015   Procedure: DILATATION AND CURETTAGE /HYSTEROSCOPY;  Surgeon: Linda Hedges, DO;  Location: [facility];  Service: Gynecology;  Laterality: N/A;   POLYPECTOMY     PORTACATH PLACEMENT Right 10/16/2021   Procedure: INSERTION PORT-A-CATH;  Surgeon: Erroll Luna, MD;  Location: [facility];  Service: General;  Laterality: Right;   RADIOACTIVE SEED GUIDED PARTIAL MASTECTOMY WITH AXILLARY SENTINEL LYMPH NODE BIOPSY Left 04/17/2016   Procedure: RADIOACTIVE SEED GUIDED PARTIAL MASTECTOMY WITH AXILLARY SENTINEL LYMPH NODE BIOPSY;  Surgeon: Erroll Luna, MD;  Location: [facility];  Service: General;  Laterality: Left;  RADIOACTIVE SEED GUIDED PARTIAL MASTECTOMY WITH AXILLARY  SENTINEL LYMPH NODE BIOPSY    TUBAL LIGATION     VAGINAL DELIVERY     x3   Social History:   reports that she quit smoking about 17 years ago. Her smoking use included cigarettes. She has a 12.50 pack-year smoking history. She has never used smokeless tobacco. She reports that she does not currently use alcohol. She reports that she does not use drugs.  Family History  Problem  Relation Age of Onset   Hypertension Mother    Stroke Mother    Stroke Sister    Alcohol abuse Brother    HIV/AIDS Brother    Breast cancer Cousin        dx 66s   Colon cancer Neg Hx    Colon polyps Neg Hx    Esophageal cancer Neg Hx    Rectal cancer Neg Hx    Stomach cancer Neg Hx     Medications: Patient's Medications  New Prescriptions   No medications on file  Previous Medications   ABEMACICLIB (VERZENIO) 50 MG TABLET    Take 1 tablet (50 mg total) by mouth 2 (two) times daily. Swallow tablets whole. Do not chew, crush, or split tablets before swallowing.   ANASTROZOLE (ARIMIDEX) 1 MG TABLET    Take 1 tablet (1 mg total) by mouth daily.   ASPIRIN EC 81 MG TABLET    Take 1 tablet (81 mg total) by mouth daily.   ATORVASTATIN (LIPITOR) 20 MG TABLET    TAKE 1 TABLET EVERY DAY   ESZOPICLONE (LUNESTA) 2 MG TABS TABLET    Take one tablet by mouth at bedtime as needed for sleep (take immediately before bedtime)   LABETALOL (NORMODYNE) 300 MG TABLET    TAKE 2 TABLETS THREE TIMES DAILY   NIFEDIPINE (PROCARDIA XL/NIFEDICAL XL) 60 MG 24 HR TABLET    Take 1 tablet (60 mg total) by mouth 2 (two) times daily.  Modified Medications   No medications on file  Discontinued Medications   No medications on file    Physical Exam:  Vitals:   05/01/22 1244  BP: 126/78  Pulse: 78  Temp: (!) 96.4 F (35.8 C)  SpO2: 98%  Weight: 203 lb 9.6 oz (92.4 kg)  Height: '5\' 2"'$  (1.575 m)   Body mass index is 37.24 kg/m. Wt Readings from Last 3 Encounters:  05/01/22 203 lb 9.6 oz (92.4 kg)  04/30/22 201 lb (91.2 kg)  03/30/22 197 lb 14.4 oz (89.8 kg)    Physical Exam Constitutional:      General: She is not in acute distress.    Appearance: She is well-developed. She is not diaphoretic.  HENT:     Head: Normocephalic and atraumatic.     Mouth/Throat:     Pharynx: No oropharyngeal exudate.  Eyes:     Conjunctiva/sclera: Conjunctivae normal.     Pupils: Pupils are equal, round, and reactive  to light.  Cardiovascular:     Rate and Rhythm: Normal rate and regular rhythm.     Heart sounds: Normal heart sounds.  Pulmonary:     Effort: Pulmonary effort is normal.     Breath sounds: Normal breath sounds.  Abdominal:     General: Bowel sounds are normal.     Palpations: Abdomen is soft.  Musculoskeletal:     Cervical back: Normal range of motion and neck supple.     Right lower leg: No edema.     Left lower leg: No edema.  Skin:    General: Skin  is warm and dry.     Comments: Area without skin pigmentation to left thumb base, no rash, sore, pain noted  Neurological:     Mental Status: She is alert.  Psychiatric:        Mood and Affect: Mood normal.     Labs reviewed: Basic Metabolic Panel: Recent Labs    01/08/22 1305 01/30/22 1134 04/30/22 1232  NA 141 141 142  K 3.6 3.5 3.8  CL 107 106 108  CO2 '29 28 27  '$ GLUCOSE 162* 150* 134*  BUN 23 24* 24*  CREATININE 1.48* 1.80* 1.97*  CALCIUM 9.4 9.6 9.7   Liver Function Tests: Recent Labs    01/08/22 1305 01/30/22 1134 04/30/22 1232  AST '26 18 22  '$ ALT '25 16 18  '$ ALKPHOS 90 100 81  BILITOT 0.5 0.4 0.5  PROT 6.7 7.1 7.0  ALBUMIN 4.3 4.4 4.4   No results for input(s): "LIPASE", "AMYLASE" in the last 8760 hours. No results for input(s): "AMMONIA" in the last 8760 hours. CBC: Recent Labs    01/08/22 1305 01/30/22 1134 04/30/22 1232  WBC 3.0* 3.8* 3.6*  NEUTROABS 0.9* 1.8 2.6  HGB 10.9* 11.4* 11.1*  HCT 32.5* 33.3* 31.9*  MCV 92.9 92.2 93.8  PLT 315 326 272   Lipid Panel: No results for input(s): "CHOL", "HDL", "LDLCALC", "TRIG", "CHOLHDL", "LDLDIRECT" in the last 8760 hours. TSH: No results for input(s): "TSH" in the last 8760 hours. A1C: Lab Results  Component Value Date   HGBA1C 5.8 (H) 05/13/2020     Assessment/Plan 1. Primary insomnia -improved on lunesta  - eszopiclone (LUNESTA) 2 MG TABS tablet; Take one tablet by mouth at bedtime as needed for sleep (take immediately before bedtime)   Dispense: 30 tablet; Refill: 0  2. Stage 3b chronic kidney disease (CKD) ([name]) -worsening renal function on last lab, recommend increase in hydration  Avoid nephrotoxic meds (NSAIDS) Follow metabolic panel - BASIC METABOLIC PANEL WITH GFR; Future to monitor in 3 weeks   3. Essential hypertension -Blood pressure well controlled Continue current medications Follow  metabolic panel  4. Vitiligo Noted, will continue to monitor  5. Morbid obesity ([place]) --education provided on healthy weight loss through increase in physical activity and proper nutrition   6. BMI 37.0-37.9, adult -noted today.   7. Mixed hyperlipidemia -continues on lipitor with dietary modification -follow lipids  Return in about 6 months (around 10/31/2022) for routine follow up- lab at appt (to be fasting). Carlos American. [place], [facility] [phone]

## 2022-05-01 NOTE — Telephone Encounter (Signed)
Called patient and no answer. Voicemail was left with office call back number.   

## 2022-05-01 NOTE — Patient Instructions (Signed)
proper hydration 64 oz waster a day and to avoid NSAIDS (Aleve, Advil, Motrin, Ibuprofen)

## 2022-05-04 ENCOUNTER — Telehealth: Payer: Self-pay | Admitting: Hematology and Oncology

## 2022-05-04 NOTE — Telephone Encounter (Signed)
Scheduled appointment per 6/15 los. Patient is aware.

## 2022-05-04 NOTE — Telephone Encounter (Signed)
Noted thank you

## 2022-05-06 ENCOUNTER — Other Ambulatory Visit: Payer: Self-pay | Admitting: Pharmacist

## 2022-05-06 MED ORDER — ABEMACICLIB 50 MG PO TABS: 50.0000 mg | ORAL_TABLET | Freq: Two times a day (BID) | ORAL | 0 refills | Status: DC

## 2022-05-07 ENCOUNTER — Telehealth: Payer: Self-pay

## 2022-05-07 ENCOUNTER — Other Ambulatory Visit: Payer: Self-pay

## 2022-05-07 MED ORDER — NIFEDIPINE ER OSMOTIC RELEASE 60 MG PO TB24: 60.0000 mg | ORAL_TABLET | Freq: Two times a day (BID) | ORAL | 3 refills | Status: DC

## 2022-05-07 NOTE — Telephone Encounter (Signed)
Incoming fax received from patients pharmacy to initiate a PA via cover my meds.   KeyKela Millin   Outcome Approvedtoday PA Case: [id number], Status: Approved, Coverage Starts on: 11/16/2021 12:00:00 AM, Coverage Ends on: 11/15/2022 12:00:00 AM. Questions? Contact [phone].   I called patients pharmacy (left a detailed message) to inform them to run rx again as the PA was completed.

## 2022-05-07 NOTE — Telephone Encounter (Signed)
Spoke with patient and confirmed that she will now use [facility] for refill versus [facility] for this specific medication.

## 2022-05-12 ENCOUNTER — Telehealth: Payer: Self-pay | Admitting: Pharmacist

## 2022-05-13 ENCOUNTER — Encounter: Payer: Self-pay | Admitting: Pharmacist

## 2022-05-14 ENCOUNTER — Other Ambulatory Visit: Payer: Self-pay

## 2022-05-14 ENCOUNTER — Inpatient Hospital Stay: Payer: Medicare HMO | Admitting: Pharmacist

## 2022-05-14 ENCOUNTER — Inpatient Hospital Stay: Payer: Medicare HMO

## 2022-05-14 VITALS — BP 140/78 | HR 77 | Temp 97.7°F | Resp 18 | Ht 62.0 in | Wt 203.1 lb

## 2022-05-14 DIAGNOSIS — Z79899 Other long term (current) drug therapy: Secondary | ICD-10-CM | POA: Diagnosis not present

## 2022-05-14 DIAGNOSIS — Z79811 Long term (current) use of aromatase inhibitors: Secondary | ICD-10-CM | POA: Diagnosis not present

## 2022-05-14 DIAGNOSIS — Z170 Estrogen receptor positive status [ER+]: Secondary | ICD-10-CM | POA: Diagnosis not present

## 2022-05-14 DIAGNOSIS — C50411 Malignant neoplasm of upper-outer quadrant of right female breast: Secondary | ICD-10-CM

## 2022-05-14 DIAGNOSIS — C50412 Malignant neoplasm of upper-outer quadrant of left female breast: Secondary | ICD-10-CM | POA: Diagnosis not present

## 2022-05-14 LAB — CMP (CANCER CENTER ONLY)
ALT: 18 U/L (ref 0–44)
AST: 22 U/L (ref 15–41)
Albumin: 4.3 g/dL (ref 3.5–5.0)
Alkaline Phosphatase: 78 U/L (ref 38–126)
Anion gap: 7 (ref 5–15)
BUN: 20 mg/dL (ref 8–23)
CO2: 24 mmol/L (ref 22–32)
Calcium: 9.3 mg/dL (ref 8.9–10.3)
Chloride: 108 mmol/L (ref 98–111)
Creatinine: 1.79 mg/dL — ABNORMAL HIGH (ref 0.44–1.00)
GFR, Estimated: 31 mL/min — ABNORMAL LOW (ref >60)
Glucose, Bld: 133 mg/dL — ABNORMAL HIGH (ref 70–99)
Potassium: 3.7 mmol/L (ref 3.5–5.1)
Sodium: 139 mmol/L (ref 135–145)
Total Bilirubin: 0.5 mg/dL (ref 0.3–1.2)
Total Protein: 6.8 g/dL (ref 6.5–8.1)

## 2022-05-14 LAB — CBC WITH DIFFERENTIAL (CANCER CENTER ONLY)
Abs Immature Granulocytes: 0.01 10*3/uL (ref 0.00–0.07)
Basophils Absolute: 0 10*3/uL (ref 0.0–0.1)
Basophils Relative: 1 %
Eosinophils Absolute: 0 10*3/uL (ref 0.0–0.5)
Eosinophils Relative: 1 %
HCT: 31.7 % — ABNORMAL LOW (ref 36.0–46.0)
Hemoglobin: 11 g/dL — ABNORMAL LOW (ref 12.0–15.0)
Immature Granulocytes: 0 %
Lymphocytes Relative: 23 %
Lymphs Abs: 0.7 10*3/uL (ref 0.7–4.0)
MCH: 32.8 pg (ref 26.0–34.0)
MCHC: 34.7 g/dL (ref 30.0–36.0)
MCV: 94.6 fL (ref 80.0–100.0)
Monocytes Absolute: 0.3 10*3/uL (ref 0.1–1.0)
Monocytes Relative: 9 %
Neutro Abs: 2.1 10*3/uL (ref 1.7–7.7)
Neutrophils Relative %: 66 %
Platelet Count: 231 10*3/uL (ref 150–400)
RBC: 3.35 MIL/uL — ABNORMAL LOW (ref 3.87–5.11)
RDW: 13.1 % (ref 11.5–15.5)
WBC Count: 3.2 10*3/uL — ABNORMAL LOW (ref 4.0–10.5)
nRBC: 0 % (ref 0.0–0.2)

## 2022-05-14 NOTE — Progress Notes (Signed)
Patient Care Team: [name], NP as PCP - General (Geriatric Medicine) Lorretta Harp, MD as PCP - Cardiology (Cardiology) Nicholas Lose, MD as Consulting Physician (Oncology) Erroll Luna, MD as Consulting Physician (General Surgery)  DIAGNOSIS:  Encounter Diagnosis  Name Primary?   Malignant neoplasm of upper-outer quadrant of right breast in female, estrogen receptor positive ([place])     SUMMARY OF ONCOLOGIC HISTORY: Oncology History  Breast cancer of upper-outer quadrant of left female breast ([place])  03/31/2016 Initial Diagnosis   Screening detected left breast mass, 2 nodules, 1.9 x 1.6 x 0.8 cm= fat necrosis; 8 x 7 x 7 mm= grade 2 IDC ER 100%, PR 100%, HER-2 negative ratio 1.39, Ki-67 20%   04/17/2016 Surgery   Left lumpectomy ([name]): IDC grade 2, 1.3 cm, with DCIS, margins negative, 0/4 lymph nodes negative, T1 cN0 stage IA pathologic stage, ER 100%, PR 100%, HER-2 negative ratio 1.39, Ki-67 20% Oncotype DX score 20, 13% ROR, intermediate risk   04/17/2016 Oncotype testing   Recurrence score: 20; ROR 15% (intermediate risk)    05/27/2016 - 07/13/2016 Radiation Therapy   Adjuvant radiation therapy [facility]): Left breast treated with breath hold to 50.4 Gy in 28 fractions at 1.8 Gy/fraction.  Left breast boosted to 10 Gy in 5 fractions at 2 Gy/fraction   07/23/2016 -  Anti-estrogen oral therapy   Anastrozole 1 mg switched to letrozole 04/22/2017 due to hair loss, switch to tamoxifen 10/22/2017 due to hair loss from letrozole as well   Malignant neoplasm of upper-outer quadrant of right breast in female, estrogen receptor positive ([place])  08/04/2021 Initial Diagnosis   Malignant neoplasm of upper-outer quadrant of right breast in female, estrogen receptor positive ([name])    Relapse/Recurrence   2.2 cm ILC ER 100%, PR 90%, Her 2 Neg, Ki 67: 25%, Size 2.5 cm   08/04/2021 Cancer Staging   Staging form: Breast, AJCC 8th Edition - Clinical: Stage IB (cT2, cN0, cM0, G2, ER+,  PR+, HER2-) - Signed by Nicholas Lose, MD on 08/04/2021 Stage prefix: Initial diagnosis Histologic grading system: 3 grade system   09/02/2021 Surgery   Right lumpectomy: Grade 2 ILC 3.1 cm, margins negative, LVI present, 4/6 lymph nodes macrometastases, 2 lymph nodes isolated tumor cells, ER 100%, PR 90%, Ki-67 25%, HER2 negative   10/17/2021 - 01/30/2022 Chemotherapy   Patient is on Treatment Plan : BREAST Adjuvant CMF IV q21d      Genetic Testing   Negative genetic testing. No pathogenic variants identified on the [facility] CustomNext+RNA panel. The report date is 03/04/2022.  The CustomNext-Cancer+RNAinsight panel offered by [facility] includes sequencing and rearrangement analysis for the following 47 genes:  APC, ATM, AXIN2, BARD1, BMPR1A, BRCA1, BRCA2, BRIP1, CDH1, CDK4, CDKN2A, CHEK2, DICER1, EPCAM, GREM1, HOXB13, MEN1, MLH1, MSH2, MSH3, MSH6, MUTYH, NBN, NF1, NF2, NTHL1, PALB2, PMS2, POLD1, POLE, PTEN, RAD51C, RAD51D, RECQL, RET, SDHA, SDHAF2, SDHB, SDHC, SDHD, SMAD4, SMARCA4, STK11, TP53, TSC1, TSC2, and VHL.  RNA data is routinely analyzed for use in variant interpretation for all genes.     CHIEF COMPLIANT: Follow-up on Anastrozole with Verzinio  INTERVAL HISTORY: Michele Page is a 66 y.o. with above-mentioned history of breast cancer, currently on adjuvant chemotherapy with CMF. She presents to the clinic today for a follow-up. States that she has constipation for about 6 days. She could not tolerate it after 2 weeks. Denies hot flashes and pain and discomfort in breast.    ALLERGIES:  has No Known Allergies.  MEDICATIONS:  Current Outpatient Medications  Medication Sig Dispense Refill   aspirin EC 81 MG tablet Take 1 tablet (81 mg total) by mouth daily. 30 tablet 6   atorvastatin (LIPITOR) 20 MG tablet TAKE 1 TABLET EVERY DAY 90 tablet 1   eszopiclone (LUNESTA) 2 MG TABS tablet Take one tablet by mouth at bedtime as needed for sleep (take immediately before bedtime) 30 tablet 0    labetalol (NORMODYNE) 300 MG tablet TAKE 2 TABLETS THREE TIMES DAILY 540 tablet 2   letrozole (FEMARA) 2.5 MG tablet Take 1 tablet (2.5 mg total) by mouth daily. 90 tablet 3   NIFEdipine (PROCARDIA XL/NIFEDICAL XL) 60 MG 24 hr tablet Take 1 tablet (60 mg total) by mouth 2 (two) times daily. 180 tablet 3   No current facility-administered medications for this visit.    PHYSICAL EXAMINATION: ECOG PERFORMANCE STATUS: 1 - Symptomatic but completely ambulatory  Vitals:   05/28/22 1430  BP: 131/73  Pulse: 76  Resp: 18  Temp: (!) 97.2 F (36.2 C)  SpO2: 100%   Filed Weights   05/28/22 1430  Weight: 203 lb 3.2 oz (92.2 kg)      LABORATORY DATA:  I have reviewed the data as listed    Latest Ref Rng & Units 05/28/2022   12:03 PM 05/14/2022   12:33 PM 04/30/2022   12:32 PM  CMP  Glucose 70 - 99 mg/dL 122  133  134   BUN 8 - 23 mg/dL _0 Creatinine 0.44 - 1.00 mg/dL 1.85  1.79  1.97   Sodium 135 - 145 mmol/L 141  139  142   Potassium 3.5 - 5.1 mmol/L 3.8  3.7  3.8   Chloride 98 - 111 mmol/L 109  108  108   CO2 22 - 32 mmol/L _1 Calcium 8.9 - 10.3 mg/dL 9.4  9.3  9.7   Total Protein 6.5 - 8.1 g/dL 7.1  6.8  7.0   Total Bilirubin 0.3 - 1.2 mg/dL 0.4  0.5  0.5   Alkaline Phos 38 - 126 U/L 92  78  81   AST 15 - 41 U/L _2 ALT 0 - 44 U/L _3 Lab Results  Component Value Date   WBC 3.3 (L) 05/28/2022   HGB 11.0 (L) 05/28/2022   HCT 31.0 (L) 05/28/2022   MCV 93.1 05/28/2022   PLT 267 05/28/2022   NEUTROABS 2.1 05/28/2022    ASSESSMENT & PLAN:  Malignant neoplasm of upper-outer quadrant of right breast in female, estrogen receptor positive ([place]) 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, 1.3 cm, with DCIS, margins negative, 0/4 lymph nodes negative, T1 cN0 stage IA pathologic stage, ER 100%, PR 100%, HER-2 negative ratio 1.39, Ki-67 20% (Originally 2 nodules were detected on screening mammogram 1.9 cm= fat necrosis and 8 mm IDC) Oncotype  DX score 20, 13% risk of recurrence, intermediate risk Adjuvant radiation therapy started 07/20/2017completed 07/13/2016     09/02/2021:Right lumpectomy: Grade 2 ILC 3.1 cm, margins negative, LVI present, 4/6 lymph nodes macrometastases, 2 lymph nodes isolated tumor cells, ER 100%, PR 90%, Ki-67 25%, HER2 negative   Treatment plan/summary: 1. systemic chemotherapy with CMF x6 cycles completed 01/30/2022 2. adjuvant radiation therapy completing 04/09/2022 3.  For the continued antiestrogen therapy (previously anastrozole started 07/23/2016 switched to letrozole 2018 switched to tamoxifen 2018, switched back to anastrozole) hair loss was the  main problem, may consider exemestane ------------------------------------------------------------------------------------------------------------------------------- 10/15/2021: CT CAP: No metastatic disease.  Descending and sigmoid colon diverticulosis, several tiny pulmonary nodules less than 5 mm, 2.1 cm left thyroid nodule   Treatment plan: We discussed different treatment options and recommended anastrozole with Verzinio   Abemaciclib toxicities: Patient could not tolerate Verzinio and she discontinued it.  She had severe constipation and overall feeling of unwell at the low-dose of 50 p.o. twice daily.  We will await the approval of ribociclib and then prescribe it for her when it is approved.  I recommend switching her from anastrozole to letrozole because she developed the contralateral breast cancer while she was on anastrozole. Return to clinic in 3 months with a telephone visit to discuss tolerance to letrozole as well as to see if ribociclib is approved.   No orders of the defined types were placed in this encounter.  The patient has a good understanding of the overall plan. she agrees with it. she will call with any problems that may develop before the next visit here. Total time spent: 30 mins including face to face time and time spent for  planning, charting and co-ordination of care   Harriette Ohara, MD 05/28/22    I Gardiner Coins am scribing for Dr. Lindi Adie  I have reviewed the above documentation for accuracy and completeness, and I agree with the above.

## 2022-05-14 NOTE — Progress Notes (Signed)
[facility]       Telephone: [phone]?Fax: [phone]   Oncology Clinical Pharmacist Practitioner Progress Note  Michele Page was contacted via in-person to discuss her chemotherapy regimen for abemaciclib which they receive under the care of Dr. Nicholas Lose.  Current treatment regimen and start date Abemaciclib (04/16/22) Anastrozole (03/30/22)  Interval History She continues on abemaciclib 50 mg by mouth every 12 hours on days 1 to 28 of a 28-day cycle. This is being given  in combination with anastrozole 1 mg by mouth daily . Therapy is planned to continue until  two years of therapy in the adjuvant setting per the monarchE trial data for abemaciclib. After that time, she will likely continue on with endocrine therapy .   Response to Therapy Michele Page was seen today by clinical pharmacy as a follow-up to her abemaciclib management.  She was having some shipping issues with [facility] and unfortunately has not taken abemaciclib in about 2 weeks per her estimation.  [place], formally known as [employer], will now be sending out her abemaciclib going forward.  We did confirm that she did receive the abemaciclib today and she plans on starting this tomorrow morning.  We again reviewed potential side effects of the abemaciclib and we did confirm that she has loperamide at home should she need it.  She states that she will contact the pharmacy should she need any antinausea medications.  She does report drinking more fluids at home and her kidney function has improved some today with her serum creatinine slightly reduced from her last visit.  She will have a follow-up appointment with Dr. Lindi Adie in 2 weeks and discuss potentially increasing the dose of abemaciclib to 100 mg every 12 hours but at this time she is comfortable staying at 50 mg every 12 hours.  Clinical pharmacy will then see her again with labs in 4 weeks.. Labs,  vitals, treatment parameters, and manufacturer guidelines assessing toxicity were reviewed with Franki Cabot today. Based on these values, patient is in agreement to continue abemaciclib therapy at this time.  Allergies No Known Allergies  Vitals    05/14/2022    1:44 PM 05/01/2022   12:44 PM 04/30/2022    2:00 PM  Vitals with BMI  Height '5\' 2"'$  '5\' 2"'$  '5\' 2"'$   Weight 203 lbs 2 oz 203 lbs 10 oz 201 lbs  BMI 37.14 20.25 42.70  Systolic 623 762 831  Diastolic 78 78 85  Pulse 77 78 75     Laboratory Data    Latest Ref Rng & Units 05/14/2022   12:33 PM 04/30/2022   12:32 PM 01/30/2022   11:34 AM  CBC EXTENDED  WBC 4.0 - 10.5 K/uL 3.2  3.6  3.8   RBC 3.87 - 5.11 MIL/uL 3.35  3.40  3.61   Hemoglobin 12.0 - 15.0 g/dL 11.0  11.1  11.4   HCT 36.0 - 46.0 % 31.7  31.9  33.3   Platelets 150 - 400 K/uL 231  272  326   NEUT# 1.7 - 7.7 K/uL 2.1  2.6  1.8   Lymph# 0.7 - 4.0 K/uL 0.7  0.6  1.1        Latest Ref Rng & Units 05/14/2022   12:33 PM 04/30/2022   12:32 PM 01/30/2022   11:34 AM  CMP  Glucose 70 - 99 mg/dL 133  134  150   BUN 8 - 23 mg/dL 20  24  24   Creatinine 0.44 - 1.00 mg/dL 1.79  1.97  1.80   Sodium 135 - 145 mmol/L 139  142  141   Potassium 3.5 - 5.1 mmol/L 3.7  3.8  3.5   Chloride 98 - 111 mmol/L 108  108  106   CO2 22 - 32 mmol/L '24  27  28   '$ Calcium 8.9 - 10.3 mg/dL 9.3  9.7  9.6   Total Protein 6.5 - 8.1 g/dL 6.8  7.0  7.1   Total Bilirubin 0.3 - 1.2 mg/dL 0.5  0.5  0.4   Alkaline Phos 38 - 126 U/L 78  81  100   AST 15 - 41 U/L '22  22  18   '$ ALT 0 - 44 U/L '18  18  16     '$ No results found for: "MG"  Adverse Effects Assessment No side effects reported at this time  Adherence Assessment ALYVIAH CRANDLE reports missing 0 doses over the past 2 weeks due to adherence.  As above, she was having logistical issues with [employer], formally known as [employer].  She has not taken abemaciclib for about 2 weeks per her estimation.  She will restart  tomorrow morning..   Reason for missed dose: Logistical/shipping issues with [facility] as noted above. Patient was re-educated on importance of adherence.   Access Assessment TORRENCE BRANAGAN is currently receiving her abemaciclib through [facility], formally known as [employer] concerns: None  Medication Reconciliation The patient's medication list was reviewed today with the patient?  Yes New medications or herbal supplements have recently been started?  No Any medications have been discontinued?  No The medication list was updated and reconciled based on the patient's most recent medication list in the electronic medical record (EMR) including herbal products and OTC medications.   Medications Current Outpatient Medications  Medication Sig Dispense Refill   abemaciclib (VERZENIO) 50 MG tablet Take 1 tablet (50 mg total) by mouth 2 (two) times daily. Swallow tablets whole. Do not chew, crush, or split tablets before swallowing. 56 tablet 0   anastrozole (ARIMIDEX) 1 MG tablet Take 1 tablet (1 mg total) by mouth daily. 90 tablet 3   aspirin EC 81 MG tablet Take 1 tablet (81 mg total) by mouth daily. 30 tablet 6   atorvastatin (LIPITOR) 20 MG tablet TAKE 1 TABLET EVERY DAY 90 tablet 1   eszopiclone (LUNESTA) 2 MG TABS tablet Take one tablet by mouth at bedtime as needed for sleep (take immediately before bedtime) 30 tablet 0   labetalol (NORMODYNE) 300 MG tablet TAKE 2 TABLETS THREE TIMES DAILY 540 tablet 2   NIFEdipine (PROCARDIA XL/NIFEDICAL XL) 60 MG 24 hr tablet Take 1 tablet (60 mg total) by mouth 2 (two) times daily. 180 tablet 3   No current facility-administered medications for this visit.    Drug-Drug Interactions (DDIs) DDIs were evaluated?  Yes Significant DDIs?  No The patient was instructed to speak with their health care provider and/or the oral chemotherapy pharmacist before starting any new drug, including  prescription or over the counter, natural / herbal products, or vitamins.  Supportive Care Diarrhea: we reviewed that diarrhea is common with abemaciclib and confirmed that she does have loperamide (Imodium) at home.  We reviewed how to take this medication PRN Neutropenia: we discussed the importance of having a thermometer and what the Centers for Disease Control and Prevention (CDC) considers a fever which is  100.40F (38C) or higher.  Gave patient 24/7 triage line to call if any fevers or symptoms ILD/Pneumonitis: we reviewed potential symptoms including cough, shortness, and fatigue.  Hepatotoxicity: WNL VTE: reviewed signs of DVT such as leg swelling, redness, pain, or tenderness and signs of PE such as shortness of breath, rapid or irregular heartbeat, cough, chest pain, or lightheadedness Reviewed to take the medication every 12 hours (with food sometimes can be easier on the stomach) and to take it at the same time every day.   Dosing Assessment Hepatic adjustments needed?  No Renal adjustments needed?  No Toxicity adjustments needed?  No The current dosing regimen is appropriate to continue at this time.  Follow-Up Plan Restart abemaciclib 50 mg by mouth every 12 hours tomorrow morning per patient preference.  Had about a 2-week period where she was not on medication due to logistical/shipping issues with [employer], formally known as [employer]. Continue anastrozole 1 mg by mouth daily Labs, Dr. Lindi Adie visit, in 2 weeks.  Ms. Erdmann may increase her dose of abemaciclib to 100 mg every 12 hours if she desires at that time.  We will likely need to send in new prescription of abemaciclib, regardless of the dose, to give [facility] pharmacy time to work the prescription per the recommendations from the pharmacy department at [facility] yesterday.  Current prescription does not have any refills. Labs, pharmacy clinic visit, in 4 weeks.   At that time, she will be on abemaciclib about 2 months.  Consider monthly labs for 2 months per manufacturing guidelines at that time if tolerating abemaciclib.  Franki Cabot participated in the discussion, expressed understanding, and voiced agreement with the above plan. All questions were answered to her satisfaction. The patient was advised to contact the clinic at [phone] with any questions or concerns prior to her return visit.   I spent 30 minutes assessing and educating the patient.  Raina Mina, RPH-CPP, 05/14/2022  1:36 PM   **Disclaimer: This note was dictated with voice recognition software. Similar sounding words can inadvertently be transcribed and this note may contain transcription errors which may not have been corrected upon publication of note.**

## 2022-05-20 DIAGNOSIS — Z452 Encounter for adjustment and management of vascular access device: Secondary | ICD-10-CM | POA: Diagnosis not present

## 2022-05-25 ENCOUNTER — Ambulatory Visit
Admission: RE | Admit: 2022-05-25 | Discharge: 2022-05-25 | Disposition: A | Discharge status: Home / Self Care | Payer: Medicare HMO | Source: Ambulatory Visit | Attending: Hematology and Oncology | Admitting: Hematology and Oncology

## 2022-05-25 DIAGNOSIS — C50412 Malignant neoplasm of upper-outer quadrant of left female breast: Secondary | ICD-10-CM | POA: Insufficient documentation

## 2022-05-25 DIAGNOSIS — Z170 Estrogen receptor positive status [ER+]: Secondary | ICD-10-CM | POA: Insufficient documentation

## 2022-05-25 NOTE — Progress Notes (Addendum)
  Radiation Oncology         [phone] ________________________________  Name: Michele Page MRN: [id number]  Date of Service: 05/25/2022  DOB: 08-Oct-1956  Post Treatment Telephone Note  Diagnosis:   Stage IB, pT2N2M0 grade 2, ER/PR positive invasive lobular carcinoma of the right breast.  Intent: Curative  Radiation Treatment Dates: 02/24/2022 through 04/09/2022 Site Technique Total Dose (Gy) Dose per Fx (Gy) Completed Fx Beam Energies  Breast, Right: Breast_R 3D 49.59/49.59 1.8 28/28 10X  Breast, Right: Breast_R_SCLV 3D 50.4/50.4 1.8 28/28 6X, 10X  Breast, Right: Breast_R_Bst specialPort 10/10 2 5/5 12E   Narrative: The patient tolerated radiation therapy relatively well. She developed anticipated skin changes in the treatment field. Her skin has improved, and she reports she had her PAC out about 2 weeks ago and is healing well.    Impression/Plan: 1. Stage IB, pT2N2M0 grade 2, ER/PR positive invasive lobular carcinoma of the right breast. The patient has been doing well since completion of radiotherapy. We discussed that we would be happy to continue to follow her as needed, but she will also continue to follow up with Dr. Lindi Adie in medical oncology. She was counseled on skin care as well as measures to avoid sun exposure to this area.  2. Survivorship. We discussed the importance of survivorship evaluation and encouraged her to attend her upcoming visit with that clinic.      Carola Rhine, PAC

## 2022-05-28 ENCOUNTER — Other Ambulatory Visit: Payer: Self-pay

## 2022-05-28 ENCOUNTER — Inpatient Hospital Stay: Payer: Medicare HMO

## 2022-05-28 ENCOUNTER — Inpatient Hospital Stay: Payer: Medicare HMO | Attending: Hematology and Oncology | Admitting: Hematology and Oncology

## 2022-05-28 DIAGNOSIS — C50411 Malignant neoplasm of upper-outer quadrant of right female breast: Secondary | ICD-10-CM | POA: Insufficient documentation

## 2022-05-28 DIAGNOSIS — Z79811 Long term (current) use of aromatase inhibitors: Secondary | ICD-10-CM | POA: Insufficient documentation

## 2022-05-28 DIAGNOSIS — Z170 Estrogen receptor positive status [ER+]: Secondary | ICD-10-CM

## 2022-05-28 LAB — CMP (CANCER CENTER ONLY)
ALT: 15 U/L (ref 0–44)
AST: 22 U/L (ref 15–41)
Albumin: 4.5 g/dL (ref 3.5–5.0)
Alkaline Phosphatase: 92 U/L (ref 38–126)
Anion gap: 8 (ref 5–15)
BUN: 21 mg/dL (ref 8–23)
CO2: 24 mmol/L (ref 22–32)
Calcium: 9.4 mg/dL (ref 8.9–10.3)
Chloride: 109 mmol/L (ref 98–111)
Creatinine: 1.85 mg/dL — ABNORMAL HIGH (ref 0.44–1.00)
GFR, Estimated: 30 mL/min — ABNORMAL LOW (ref >60)
Glucose, Bld: 122 mg/dL — ABNORMAL HIGH (ref 70–99)
Potassium: 3.8 mmol/L (ref 3.5–5.1)
Sodium: 141 mmol/L (ref 135–145)
Total Bilirubin: 0.4 mg/dL (ref 0.3–1.2)
Total Protein: 7.1 g/dL (ref 6.5–8.1)

## 2022-05-28 LAB — CBC WITH DIFFERENTIAL (CANCER CENTER ONLY)
Abs Immature Granulocytes: 0.01 10*3/uL (ref 0.00–0.07)
Basophils Absolute: 0.1 10*3/uL (ref 0.0–0.1)
Basophils Relative: 2 %
Eosinophils Absolute: 0.1 10*3/uL (ref 0.0–0.5)
Eosinophils Relative: 2 %
HCT: 31 % — ABNORMAL LOW (ref 36.0–46.0)
Hemoglobin: 11 g/dL — ABNORMAL LOW (ref 12.0–15.0)
Immature Granulocytes: 0 %
Lymphocytes Relative: 22 %
Lymphs Abs: 0.7 10*3/uL (ref 0.7–4.0)
MCH: 33 pg (ref 26.0–34.0)
MCHC: 35.5 g/dL (ref 30.0–36.0)
MCV: 93.1 fL (ref 80.0–100.0)
Monocytes Absolute: 0.3 10*3/uL (ref 0.1–1.0)
Monocytes Relative: 10 %
Neutro Abs: 2.1 10*3/uL (ref 1.7–7.7)
Neutrophils Relative %: 64 %
Platelet Count: 267 10*3/uL (ref 150–400)
RBC: 3.33 MIL/uL — ABNORMAL LOW (ref 3.87–5.11)
RDW: 13.2 % (ref 11.5–15.5)
WBC Count: 3.3 10*3/uL — ABNORMAL LOW (ref 4.0–10.5)
nRBC: 0 % (ref 0.0–0.2)

## 2022-05-28 MED ORDER — LETROZOLE 2.5 MG PO TABS: 2.5000 mg | ORAL_TABLET | Freq: Every day | ORAL | 3 refills | Status: DC

## 2022-05-28 NOTE — Assessment & Plan Note (Addendum)
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, 1.3 cm, with DCIS, margins negative, 0/4 lymph nodes negative, T1 cN0 stage IA pathologic stage, ER 100%, PR 100%, HER-2 negative ratio 1.39, Ki-67 20% (Originally 2 nodules were detected on screening mammogram 1.9 cm= fat necrosis and 8 mm IDC) Oncotype DX score 20, 13% risk of recurrence, intermediate risk Adjuvant radiation therapy started 07/20/2017completed 07/13/2016   09/02/2021:Right lumpectomy: Grade 2 ILC 3.1 cm, margins negative, LVI present, 4/6 lymph nodes macrometastases, 2 lymph nodes isolated tumor cells, ER 100%, PR 90%, Ki-67 25%, HER2 negative  Treatment plan/summary: 1.systemic chemotherapy with CMF x6cycles completed 01/30/2022 2.adjuvant radiation therapy completing 04/09/2022 3.For the continued antiestrogen therapy (previously anastrozole started 07/23/2016 switched to letrozole 2018 switched to tamoxifen 2018, switched back to anastrozole) hair loss was the main problem, may consider exemestane ------------------------------------------------------------------------------------------------------------------------------- 10/15/2021: CT CAP: No metastatic disease. Descending and sigmoid colon diverticulosis, several tiny pulmonary nodules less than 5 mm, 2.1 cm left thyroid nodule  Treatment plan: We discussed different treatment options and recommended anastrozole with Verzinio  Abemaciclib toxicities: Patient could not tolerate Verzinio and she discontinued it.  She had severe constipation and overall feeling of unwell at the low-dose of 50 p.o. twice daily.  We will await the approval of ribociclib and then prescribe it for her when it is approved.  I recommend switching her from anastrozole to letrozole because she developed the contralateral breast cancer while she was on anastrozole.

## 2022-05-29 ENCOUNTER — Encounter: Payer: Self-pay | Admitting: *Deleted

## 2022-06-03 ENCOUNTER — Other Ambulatory Visit: Payer: Self-pay | Admitting: Hematology and Oncology

## 2022-06-03 NOTE — Telephone Encounter (Signed)
Hi Michele Page, can you please review and refill if needed.  

## 2022-06-04 ENCOUNTER — Encounter: Payer: Self-pay | Admitting: Hematology and Oncology

## 2022-06-05 ENCOUNTER — Other Ambulatory Visit: Payer: Self-pay | Admitting: *Deleted

## 2022-06-05 ENCOUNTER — Other Ambulatory Visit: Payer: Medicare HMO

## 2022-06-05 DIAGNOSIS — N1832 Chronic kidney disease, stage 3b: Secondary | ICD-10-CM

## 2022-06-05 DIAGNOSIS — E782 Mixed hyperlipidemia: Secondary | ICD-10-CM

## 2022-06-05 MED ORDER — LETROZOLE 2.5 MG PO TABS: 2.5000 mg | ORAL_TABLET | Freq: Every day | ORAL | 3 refills | Status: DC

## 2022-06-06 LAB — BASIC METABOLIC PANEL WITH GFR
BUN/Creatinine Ratio: 13 (calc) (ref 6–22)
BUN: 23 mg/dL (ref 7–25)
CO2: 24 mmol/L (ref 20–32)
Calcium: 9.8 mg/dL (ref 8.6–10.4)
Chloride: 106 mmol/L (ref 98–110)
Creat: 1.77 mg/dL — ABNORMAL HIGH (ref 0.50–1.05)
Glucose, Bld: 136 mg/dL (ref 65–139)
Potassium: 3.8 mmol/L (ref 3.5–5.3)
Sodium: 141 mmol/L (ref 135–146)
eGFR: 31 mL/min/{1.73_m2} — ABNORMAL LOW (ref >=60)

## 2022-06-06 LAB — LIPID PANEL
Cholesterol: 161 mg/dL (ref <200)
HDL: 69 mg/dL (ref >=50)
LDL Cholesterol (Calc): 78 mg/dL (calc)
Non-HDL Cholesterol (Calc): 92 mg/dL (calc) (ref <130)
Total CHOL/HDL Ratio: 2.3 (calc) (ref <5.0)
Triglycerides: 68 mg/dL (ref <150)

## 2022-06-11 ENCOUNTER — Telehealth: Payer: Self-pay | Admitting: Pharmacist

## 2022-06-11 ENCOUNTER — Inpatient Hospital Stay: Payer: Medicare HMO

## 2022-06-11 ENCOUNTER — Inpatient Hospital Stay: Payer: Medicare HMO | Admitting: Pharmacist

## 2022-06-11 NOTE — Telephone Encounter (Signed)
Contacted patient to cancel appointment today per Jenny Reichmann the Pharmacist. Patient is aware.

## 2022-06-12 DIAGNOSIS — Z170 Estrogen receptor positive status [ER+]: Secondary | ICD-10-CM | POA: Diagnosis not present

## 2022-06-12 DIAGNOSIS — C50411 Malignant neoplasm of upper-outer quadrant of right female breast: Secondary | ICD-10-CM | POA: Diagnosis not present

## 2022-06-17 ENCOUNTER — Other Ambulatory Visit: Payer: Self-pay | Admitting: *Deleted

## 2022-06-17 DIAGNOSIS — F5101 Primary insomnia: Secondary | ICD-10-CM

## 2022-06-17 MED ORDER — ESZOPICLONE 2 MG PO TABS: ORAL_TABLET | ORAL | 0 refills | Status: DC

## 2022-06-17 NOTE — Telephone Encounter (Signed)
Patient requested refill.  Epic LR: 05/01/2022 Pended Rx and sent to [facility] for approval.

## 2022-06-18 MED ORDER — ESZOPICLONE 2 MG PO TABS: ORAL_TABLET | ORAL | 0 refills | Status: DC

## 2022-06-29 ENCOUNTER — Inpatient Hospital Stay: Payer: Medicare HMO | Attending: Hematology and Oncology | Admitting: Hematology and Oncology

## 2022-06-29 DIAGNOSIS — C50411 Malignant neoplasm of upper-outer quadrant of right female breast: Secondary | ICD-10-CM | POA: Diagnosis not present

## 2022-06-29 DIAGNOSIS — Z79811 Long term (current) use of aromatase inhibitors: Secondary | ICD-10-CM | POA: Insufficient documentation

## 2022-06-29 DIAGNOSIS — Z170 Estrogen receptor positive status [ER+]: Secondary | ICD-10-CM | POA: Insufficient documentation

## 2022-06-29 DIAGNOSIS — C50412 Malignant neoplasm of upper-outer quadrant of left female breast: Secondary | ICD-10-CM | POA: Insufficient documentation

## 2022-06-29 NOTE — Assessment & Plan Note (Signed)
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, 1.3 cm, with DCIS, margins negative, 0/4 lymph nodes negative, T1 cN0 stage IA pathologic stage, ER 100%, PR 100%, HER-2 negative ratio 1.39, Ki-67 20% (Originally 2 nodules were detected on screening mammogram 1.9 cm= fat necrosis and 8 mm IDC) Oncotype DX score 20, 13% risk of recurrence, intermediate risk Adjuvant radiation therapy started 07/20/2017completed 07/13/2016   09/02/2021:Right lumpectomy: Grade 2 ILC 3.1 cm, margins negative, LVI present, 4/6 lymph nodes macrometastases, 2 lymph nodes isolated tumor cells, ER 100%, PR 90%, Ki-67 25%, HER2 negative  Treatment plan/summary: 1.systemic chemotherapy with CMF x6cyclescompleted 01/30/2022 2.adjuvant radiation therapycompleting 04/09/2022 3.For the continued antiestrogen therapy (previously anastrozole started 07/23/2016 switched to letrozole 2018 switched to tamoxifen 2018, switched back to anastrozole) hair loss was the main problem, may consider exemestane ------------------------------------------------------------------------------------------------------------------------------- 10/15/2021: CT CAP: No metastatic disease. Descending and sigmoid colon diverticulosis, several tiny pulmonary nodules less than 5 mm, 2.1 cm left thyroid nodule  Treatment plan: We discussed different treatment options and recommended anastrozole with Verzinio  Abemaciclib toxicities: Patient could not tolerate Verzinio and she discontinued it.  She had severe constipation and overall feeling of unwell at the low-dose of 50 p.o. twice daily.  We will await the approval of ribociclib and then prescribe it for her when it is approved. We switched her from anastrozole to letrozole.  Signatera testing: Positive We will obtain CT chest abdomen pelvis for staging. Since ribociclib does not get approved, I recommend that we consider putting her back on Verzinio. 

## 2022-06-29 NOTE — Progress Notes (Signed)
HEMATOLOGY-ONCOLOGY TELEPHONE VISIT PROGRESS NOTE  I connected with our patient on 06/29/22 at 12:00 PM EDT by telephone and verified that I am speaking with the correct person using two identifiers.  I discussed the limitations, risks, security and privacy concerns of performing an evaluation and management service by telephone and the availability of in person appointments.  I also discussed with the patient that there may be a patient responsible charge related to this service. The patient expressed understanding and agreed to proceed.   History of Present Illness: Follow-up to discuss the result of Signatera  Michele Page is 66 year old with above-mentioned history of breast cancer who previously had left breast cancer and then later on developed right breast cancer.  She underwent lumpectomy followed by adjuvant chemotherapy with CMV.  She is here because we performed a Signatera testing for minimal residual disease and the test came back positive indicating that she does have microscopic disease somewhere in the body.  Oncology History  Breast cancer of upper-outer quadrant of left female breast ([place])  03/31/2016 Initial Diagnosis   Screening detected left breast mass, 2 nodules, 1.9 x 1.6 x 0.8 cm= fat necrosis; 8 x 7 x 7 mm= grade 2 IDC ER 100%, PR 100%, HER-2 negative ratio 1.39, Ki-67 20%   04/17/2016 Surgery   Left lumpectomy ([name]): IDC grade 2, 1.3 cm, with DCIS, margins negative, 0/4 lymph nodes negative, T1 cN0 stage IA pathologic stage, ER 100%, PR 100%, HER-2 negative ratio 1.39, Ki-67 20% Oncotype DX score 20, 13% ROR, intermediate risk   04/17/2016 Oncotype testing   Recurrence score: 20; ROR 15% (intermediate risk)    05/27/2016 - 07/13/2016 Radiation Therapy   Adjuvant radiation therapy [facility]): Left breast treated with breath hold to 50.4 Gy in 28 fractions at 1.8 Gy/fraction.  Left breast boosted to 10 Gy in 5 fractions at 2 Gy/fraction   07/23/2016 -  Anti-estrogen oral therapy    Anastrozole 1 mg switched to letrozole 04/22/2017 due to hair loss, switch to tamoxifen 10/22/2017 due to hair loss from letrozole as well   Malignant neoplasm of upper-outer quadrant of right breast in female, estrogen receptor positive ([place])  08/04/2021 Initial Diagnosis   Malignant neoplasm of upper-outer quadrant of right breast in female, estrogen receptor positive ([name])    Relapse/Recurrence   2.2 cm ILC ER 100%, PR 90%, Her 2 Neg, Ki 67: 25%, Size 2.5 cm   08/04/2021 Cancer Staging   Staging form: Breast, AJCC 8th Edition - Clinical: Stage IB (cT2, cN0, cM0, G2, ER+, PR+, HER2-) - Signed by Nicholas Lose, MD on 08/04/2021 Stage prefix: Initial diagnosis Histologic grading system: 3 grade system   09/02/2021 Surgery   Right lumpectomy: Grade 2 ILC 3.1 cm, margins negative, LVI present, 4/6 lymph nodes macrometastases, 2 lymph nodes isolated tumor cells, ER 100%, PR 90%, Ki-67 25%, HER2 negative   10/17/2021 - 01/30/2022 Chemotherapy   Patient is on Treatment Plan : BREAST Adjuvant CMF IV q21d      Genetic Testing   Negative genetic testing. No pathogenic variants identified on the [employer] CustomNext+RNA panel. The report date is 03/04/2022.  The CustomNext-Cancer+RNAinsight panel offered by [employer] [facility] includes sequencing and rearrangement analysis for the following 47 genes:  APC, ATM, AXIN2, BARD1, BMPR1A, BRCA1, BRCA2, BRIP1, CDH1, CDK4, CDKN2A, CHEK2, DICER1, EPCAM, GREM1, HOXB13, MEN1, MLH1, MSH2, MSH3, MSH6, MUTYH, NBN, NF1, NF2, NTHL1, PALB2, PMS2, POLD1, POLE, PTEN, RAD51C, RAD51D, RECQL, RET, SDHA, SDHAF2, SDHB, SDHC, SDHD, SMAD4, SMARCA4, STK11, TP53, TSC1, TSC2, and  VHL.  RNA data is routinely analyzed for use in variant interpretation for all genes.     REVIEW OF SYSTEMS:   Constitutional: Denies fevers, chills or abnormal weight loss All other systems were reviewed with the patient and are negative. Observations/Objective:     Assessment Plan:  Malignant neoplasm of  upper-outer quadrant of right breast in female, estrogen receptor positive ([place]) 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, 1.3 cm, with DCIS, margins negative, 0/4 lymph nodes negative, T1 cN0 stage IA pathologic stage, ER 100%, PR 100%, HER-2 negative ratio 1.39, Ki-67 20% (Originally 2 nodules were detected on screening mammogram 1.9 cm= fat necrosis and 8 mm IDC) Oncotype DX score 20, 13% risk of recurrence, intermediate risk Adjuvant radiation therapy started 07/20/2017completed 07/13/2016     09/02/2021:Right lumpectomy: Grade 2 ILC 3.1 cm, margins negative, LVI present, 4/6 lymph nodes macrometastases, 2 lymph nodes isolated tumor cells, ER 100%, PR 90%, Ki-67 25%, HER2 negative   Treatment plan/summary: 1. systemic chemotherapy with CMF x6 cycles completed 01/30/2022 2. adjuvant radiation therapy completing 04/09/2022 3.  For the continued antiestrogen therapy (previously anastrozole started 07/23/2016 switched to letrozole 2018 switched to tamoxifen 2018, switched back to anastrozole) hair loss was the main problem, may consider exemestane ------------------------------------------------------------------------------------------------------------------------------- 10/15/2021: CT CAP: No metastatic disease.  Descending and sigmoid colon diverticulosis, several tiny pulmonary nodules less than 5 mm, 2.1 cm left thyroid nodule   Treatment plan: We discussed different treatment options and recommended anastrozole with Verzinio   Abemaciclib toxicities: Patient could not tolerate Verzinio and she discontinued it.  She had severe constipation and overall feeling of unwell at the low-dose of 50 p.o. twice daily.   We will await the approval of ribociclib and then prescribe it for her when it is approved. We switched her from anastrozole to letrozole.  Signatera testing: Positive We will obtain CT chest abdomen pelvis for staging. Since ribociclib does not get approved, I recommend that we  consider putting her back on Verzinio. Alternately if she is willing to retry Verzinio we can consider that again.   I discussed the assessment and treatment plan with the patient. The patient was provided an opportunity to ask questions and all were answered. The patient agreed with the plan and demonstrated an understanding of the instructions. The patient was advised to call back or seek an in-person evaluation if the symptoms worsen or if the condition fails to improve as anticipated.   I provided 12 minutes of non-face-to-face time during this encounter.  This includes time for charting and coordination of care   Michele Ohara, MD

## 2022-07-06 ENCOUNTER — Inpatient Hospital Stay: Payer: Medicare HMO

## 2022-07-06 DIAGNOSIS — Z79811 Long term (current) use of aromatase inhibitors: Secondary | ICD-10-CM | POA: Diagnosis not present

## 2022-07-06 DIAGNOSIS — C50412 Malignant neoplasm of upper-outer quadrant of left female breast: Secondary | ICD-10-CM | POA: Diagnosis not present

## 2022-07-06 DIAGNOSIS — Z170 Estrogen receptor positive status [ER+]: Secondary | ICD-10-CM | POA: Diagnosis not present

## 2022-07-06 DIAGNOSIS — C50411 Malignant neoplasm of upper-outer quadrant of right female breast: Secondary | ICD-10-CM

## 2022-07-06 LAB — CMP (CANCER CENTER ONLY)
ALT: 19 U/L (ref 0–44)
AST: 23 U/L (ref 15–41)
Albumin: 4.3 g/dL (ref 3.5–5.0)
Alkaline Phosphatase: 80 U/L (ref 38–126)
Anion gap: 6 (ref 5–15)
BUN: 20 mg/dL (ref 8–23)
CO2: 28 mmol/L (ref 22–32)
Calcium: 9.7 mg/dL (ref 8.9–10.3)
Chloride: 106 mmol/L (ref 98–111)
Creatinine: 1.52 mg/dL — ABNORMAL HIGH (ref 0.44–1.00)
GFR, Estimated: 38 mL/min — ABNORMAL LOW (ref >60)
Glucose, Bld: 130 mg/dL — ABNORMAL HIGH (ref 70–99)
Potassium: 3.4 mmol/L — ABNORMAL LOW (ref 3.5–5.1)
Sodium: 140 mmol/L (ref 135–145)
Total Bilirubin: 0.4 mg/dL (ref 0.3–1.2)
Total Protein: 6.6 g/dL (ref 6.5–8.1)

## 2022-07-06 LAB — CBC WITH DIFFERENTIAL (CANCER CENTER ONLY)
Abs Immature Granulocytes: 0.01 10*3/uL (ref 0.00–0.07)
Basophils Absolute: 0 10*3/uL (ref 0.0–0.1)
Basophils Relative: 1 %
Eosinophils Absolute: 0.2 10*3/uL (ref 0.0–0.5)
Eosinophils Relative: 3 %
HCT: 31.6 % — ABNORMAL LOW (ref 36.0–46.0)
Hemoglobin: 11.2 g/dL — ABNORMAL LOW (ref 12.0–15.0)
Immature Granulocytes: 0 %
Lymphocytes Relative: 18 %
Lymphs Abs: 0.9 10*3/uL (ref 0.7–4.0)
MCH: 32.8 pg (ref 26.0–34.0)
MCHC: 35.4 g/dL (ref 30.0–36.0)
MCV: 92.7 fL (ref 80.0–100.0)
Monocytes Absolute: 0.4 10*3/uL (ref 0.1–1.0)
Monocytes Relative: 8 %
Neutro Abs: 3.6 10*3/uL (ref 1.7–7.7)
Neutrophils Relative %: 70 %
Platelet Count: 246 10*3/uL (ref 150–400)
RBC: 3.41 MIL/uL — ABNORMAL LOW (ref 3.87–5.11)
RDW: 12.8 % (ref 11.5–15.5)
WBC Count: 5.2 10*3/uL (ref 4.0–10.5)
nRBC: 0 % (ref 0.0–0.2)

## 2022-07-08 ENCOUNTER — Encounter (HOSPITAL_COMMUNITY): Payer: Self-pay

## 2022-07-08 ENCOUNTER — Ambulatory Visit (HOSPITAL_COMMUNITY)
Admission: RE | Admit: 2022-07-08 | Discharge: 2022-07-08 | Disposition: A | Discharge status: Home / Self Care | Payer: Medicare HMO | Source: Ambulatory Visit | Attending: Hematology and Oncology | Admitting: Hematology and Oncology

## 2022-07-08 DIAGNOSIS — Z170 Estrogen receptor positive status [ER+]: Secondary | ICD-10-CM | POA: Insufficient documentation

## 2022-07-08 DIAGNOSIS — C50411 Malignant neoplasm of upper-outer quadrant of right female breast: Secondary | ICD-10-CM | POA: Insufficient documentation

## 2022-07-08 DIAGNOSIS — I700 Atherosclerosis of aorta: Secondary | ICD-10-CM | POA: Diagnosis not present

## 2022-07-08 DIAGNOSIS — K5730 Diverticulosis of large intestine without perforation or abscess without bleeding: Secondary | ICD-10-CM | POA: Diagnosis not present

## 2022-07-08 DIAGNOSIS — Z853 Personal history of malignant neoplasm of breast: Secondary | ICD-10-CM | POA: Diagnosis not present

## 2022-07-08 DIAGNOSIS — J432 Centrilobular emphysema: Secondary | ICD-10-CM | POA: Diagnosis not present

## 2022-07-08 DIAGNOSIS — R918 Other nonspecific abnormal finding of lung field: Secondary | ICD-10-CM | POA: Diagnosis not present

## 2022-07-08 MED ORDER — SODIUM CHLORIDE (PF) 0.9 % IJ SOLN
INTRAMUSCULAR | Status: AC
  Filled 2022-07-08: qty 50

## 2022-07-08 MED ORDER — IOHEXOL 300 MG/ML  SOLN
80.0000 mL | Freq: Once | INTRAMUSCULAR | Status: AC | PRN
  Administered 2022-07-08: 80 mL via INTRAVENOUS

## 2022-07-13 ENCOUNTER — Inpatient Hospital Stay: Payer: Medicare HMO | Admitting: Hematology and Oncology

## 2022-07-14 ENCOUNTER — Inpatient Hospital Stay (HOSPITAL_BASED_OUTPATIENT_CLINIC_OR_DEPARTMENT_OTHER): Payer: Medicare HMO | Admitting: Hematology and Oncology

## 2022-07-14 ENCOUNTER — Other Ambulatory Visit: Payer: Self-pay

## 2022-07-14 VITALS — BP 141/76 | HR 85 | Temp 97.7°F | Resp 18 | Ht 62.0 in | Wt 201.3 lb

## 2022-07-14 DIAGNOSIS — Z170 Estrogen receptor positive status [ER+]: Secondary | ICD-10-CM

## 2022-07-14 DIAGNOSIS — C50411 Malignant neoplasm of upper-outer quadrant of right female breast: Secondary | ICD-10-CM | POA: Diagnosis not present

## 2022-07-14 DIAGNOSIS — Z79811 Long term (current) use of aromatase inhibitors: Secondary | ICD-10-CM | POA: Diagnosis not present

## 2022-07-14 DIAGNOSIS — C50412 Malignant neoplasm of upper-outer quadrant of left female breast: Secondary | ICD-10-CM | POA: Diagnosis not present

## 2022-07-14 NOTE — Progress Notes (Signed)
Patient Care Team: [name], NP as PCP - General (Geriatric Medicine) Lorretta Harp, MD as PCP - Cardiology (Cardiology) Nicholas Lose, MD as Consulting Physician (Oncology) Erroll Luna, MD as Consulting Physician (General Surgery)  DIAGNOSIS:  Encounter Diagnosis  Name Primary?   Malignant neoplasm of upper-outer quadrant of right breast in female, estrogen receptor positive ([place]) Yes    SUMMARY OF ONCOLOGIC HISTORY: Oncology History  Breast cancer of upper-outer quadrant of left female breast ([place])  03/31/2016 Initial Diagnosis   Screening detected left breast mass, 2 nodules, 1.9 x 1.6 x 0.8 cm= fat necrosis; 8 x 7 x 7 mm= grade 2 IDC ER 100%, PR 100%, HER-2 negative ratio 1.39, Ki-67 20%   04/17/2016 Surgery   Left lumpectomy ([name]): IDC grade 2, 1.3 cm, with DCIS, margins negative, 0/4 lymph nodes negative, T1 cN0 stage IA pathologic stage, ER 100%, PR 100%, HER-2 negative ratio 1.39, Ki-67 20% Oncotype DX score 20, 13% ROR, intermediate risk   04/17/2016 Oncotype testing   Recurrence score: 20; ROR 15% (intermediate risk)    05/27/2016 - 07/13/2016 Radiation Therapy   Adjuvant radiation therapy [facility]): Left breast treated with breath hold to 50.4 Gy in 28 fractions at 1.8 Gy/fraction.  Left breast boosted to 10 Gy in 5 fractions at 2 Gy/fraction   07/23/2016 -  Anti-estrogen oral therapy   Anastrozole 1 mg switched to letrozole 04/22/2017 due to hair loss, switch to tamoxifen 10/22/2017 due to hair loss from letrozole as well   Malignant neoplasm of upper-outer quadrant of right breast in female, estrogen receptor positive ([place])  08/04/2021 Initial Diagnosis   Malignant neoplasm of upper-outer quadrant of right breast in female, estrogen receptor positive ([name])    Relapse/Recurrence   2.2 cm ILC ER 100%, PR 90%, Her 2 Neg, Ki 67: 25%, Size 2.5 cm   08/04/2021 Cancer Staging   Staging form: Breast, AJCC 8th Edition - Clinical: Stage IB (cT2, cN0, cM0, G2,  ER+, PR+, HER2-) - Signed by Nicholas Lose, MD on 08/04/2021 Stage prefix: Initial diagnosis Histologic grading system: 3 grade system   09/02/2021 Surgery   Right lumpectomy: Grade 2 ILC 3.1 cm, margins negative, LVI present, 4/6 lymph nodes macrometastases, 2 lymph nodes isolated tumor cells, ER 100%, PR 90%, Ki-67 25%, HER2 negative   10/17/2021 - 01/30/2022 Chemotherapy   Patient is on Treatment Plan : BREAST Adjuvant CMF IV q21d      Genetic Testing   Negative genetic testing. No pathogenic variants identified on the [employer] CustomNext+RNA panel. The report date is 03/04/2022.  The CustomNext-Cancer+RNAinsight panel offered by [employer] [facility] includes sequencing and rearrangement analysis for the following 47 genes:  APC, ATM, AXIN2, BARD1, BMPR1A, BRCA1, BRCA2, BRIP1, CDH1, CDK4, CDKN2A, CHEK2, DICER1, EPCAM, GREM1, HOXB13, MEN1, MLH1, MSH2, MSH3, MSH6, MUTYH, NBN, NF1, NF2, NTHL1, PALB2, PMS2, POLD1, POLE, PTEN, RAD51C, RAD51D, RECQL, RET, SDHA, SDHAF2, SDHB, SDHC, SDHD, SMAD4, SMARCA4, STK11, TP53, TSC1, TSC2, and VHL.  RNA data is routinely analyzed for use in variant interpretation for all genes.     CHIEF COMPLIANT: Follow-up to discuss scans  INTERVAL HISTORY: Michele Page is a 66 y.o with right lumpectomy on letrozole. She presents to the clinic today for a follow-up to discuss recent scans. She states that her neck has been bothering her and also has headaches mainly on the left side of her head, that started 2 weeks ago. It's not constant. It feels more like a tension headache. She complains of her neck being  stiff.    ALLERGIES:  has No Known Allergies.  MEDICATIONS:  Current Outpatient Medications  Medication Sig Dispense Refill   aspirin EC 81 MG tablet Take 1 tablet (81 mg total) by mouth daily. 30 tablet 6   atorvastatin (LIPITOR) 20 MG tablet TAKE 1 TABLET EVERY DAY 90 tablet 1   eszopiclone (LUNESTA) 2 MG TABS tablet Take one tablet by mouth at bedtime as needed for sleep  (take immediately before bedtime) 30 tablet 0   labetalol (NORMODYNE) 300 MG tablet TAKE 2 TABLETS THREE TIMES DAILY 540 tablet 2   letrozole (FEMARA) 2.5 MG tablet Take 1 tablet (2.5 mg total) by mouth daily. 90 tablet 3   NIFEdipine (PROCARDIA XL/NIFEDICAL XL) 60 MG 24 hr tablet Take 1 tablet (60 mg total) by mouth 2 (two) times daily. 180 tablet 3   No current facility-administered medications for this visit.    PHYSICAL EXAMINATION: ECOG PERFORMANCE STATUS: 1 - Symptomatic but completely ambulatory  Vitals:   07/14/22 0857  BP: (!) 141/76  Pulse: 85  Resp: 18  Temp: 97.7 F (36.5 C)  SpO2: 100%   Filed Weights   07/14/22 0857  Weight: 201 lb 4.8 oz (91.3 kg)      LABORATORY DATA:  I have reviewed the data as listed    Latest Ref Rng & Units 07/06/2022   11:52 AM 06/05/2022    1:29 PM 05/28/2022   12:03 PM  CMP  Glucose 70 - 99 mg/dL 130  136  122   BUN 8 - 23 mg/dL _0 Creatinine 0.44 - 1.00 mg/dL 1.52  1.77  1.85   Sodium 135 - 145 mmol/L 140  141  141   Potassium 3.5 - 5.1 mmol/L 3.4  3.8  3.8   Chloride 98 - 111 mmol/L 106  106  109   CO2 22 - 32 mmol/L _1 Calcium 8.9 - 10.3 mg/dL 9.7  9.8  9.4   Total Protein 6.5 - 8.1 g/dL 6.6   7.1   Total Bilirubin 0.3 - 1.2 mg/dL 0.4   0.4   Alkaline Phos 38 - 126 U/L 80   92   AST 15 - 41 U/L 23   22   ALT 0 - 44 U/L 19   15     Lab Results  Component Value Date   WBC 5.2 07/06/2022   HGB 11.2 (L) 07/06/2022   HCT 31.6 (L) 07/06/2022   MCV 92.7 07/06/2022   PLT 246 07/06/2022   NEUTROABS 3.6 07/06/2022    ASSESSMENT & PLAN:  Malignant neoplasm of upper-outer quadrant of right breast in female, estrogen receptor positive ([place]) 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, 1.3 cm, with DCIS, margins negative, 0/4 lymph nodes negative, T1 cN0 stage IA pathologic stage, ER 100%, PR 100%, HER-2 negative ratio 1.39, Ki-67 20% (Originally 2 nodules were detected on screening mammogram 1.9 cm= fat necrosis  and 8 mm IDC) Oncotype DX score 20, 13% risk of recurrence, intermediate risk Adjuvant radiation therapy started 07/20/2017completed 07/13/2016     09/02/2021:Right lumpectomy: Grade 2 ILC 3.1 cm, margins negative, LVI present, 4/6 lymph nodes macrometastases, 2 lymph nodes isolated tumor cells, ER 100%, PR 90%, Ki-67 25%, HER2 negative   Treatment plan/summary: 1. systemic chemotherapy with CMF x6 cycles completed 01/30/2022 2. adjuvant radiation therapy completing 04/09/2022 3.  For the continued antiestrogen therapy (previously anastrozole started 07/23/2016 switched to letrozole 2018 switched to tamoxifen 2018,  switched back to anastrozole) hair loss was the main problem, may consider exemestane ------------------------------------------------------------------------------------------------------------------------------- Current treatment: Letrozole (because Signatera testing came back positive).  Patient could not tolerate Verzinio and she discontinued it.  07/10/2022: CT CAP: No evidence of metastatic disease, tiny pulmonary nodules nonspecific heterogeneous 2.2 cm left thyroid nodule  Thyroid nodule: We will obtain a thyroid ultrasound.  Since we changed the antiestrogen therapy we will wait to see if that changes the Signatera test results. If it persists to be positive then we may have to add ribociclib.  Return to clinic in 6 months for follow-up    Orders Placed This Encounter  Procedures   US SOFT TISSUE HEAD & NECK (NON-THYROID)    Standing Status:   Future    Standing Expiration Date:   07/15/2023    Order Specific Question:   Reason for exam:    Answer:   thyroid nodule    Order Specific Question:   Preferred imaging location?    Answer:   [facility]   The patient has a good understanding of the overall plan. she agrees with it. she will call with any problems that may develop before the next visit here. Total time spent: 30 mins including face to face time and  time spent for planning, charting and co-ordination of care   Harriette Ohara, MD 07/14/22    I Gardiner Coins am scribing for Dr. Lindi Adie  I have reviewed the above documentation for accuracy and completeness, and I agree with the above.

## 2022-07-14 NOTE — Assessment & Plan Note (Signed)
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, 1.3 cm, with DCIS, margins negative, 0/4 lymph nodes negative, T1 cN0 stage IA pathologic stage, ER 100%, PR 100%, HER-2 negative ratio 1.39, Ki-67 20% (Originally 2 nodules were detected on screening mammogram 1.9 cm= fat necrosis and 8 mm IDC) Oncotype DX score 20, 13% risk of recurrence, intermediate risk Adjuvant radiation therapy started 07/20/2017completed 07/13/2016   09/02/2021:Right lumpectomy: Grade 2 ILC 3.1 cm, margins negative, LVI present, 4/6 lymph nodes macrometastases, 2 lymph nodes isolated tumor cells, ER 100%, PR 90%, Ki-67 25%, HER2 negative  Treatment plan/summary: 1.systemic chemotherapy with CMF x6cyclescompleted 01/30/2022 2.adjuvant radiation therapycompleting 04/09/2022 3.For the continued antiestrogen therapy (previously anastrozole started 07/23/2016 switched to letrozole 2018 switched to tamoxifen 2018, switched back to anastrozole) hair loss was the main problem, may consider exemestane ------------------------------------------------------------------------------------------------------------------------------- Current treatment: Letrozole (because Signatera testing came back positive).  Patient could not tolerate Verzinio and she discontinued it.  07/10/2022: CT CAP: No evidence of metastatic disease, tiny pulmonary nodules nonspecific heterogeneous 2.2 cm left thyroid nodule   Return to clinic in 6 months for follow-up

## 2022-07-30 ENCOUNTER — Other Ambulatory Visit: Payer: Self-pay

## 2022-07-30 DIAGNOSIS — F5101 Primary insomnia: Secondary | ICD-10-CM

## 2022-07-30 MED ORDER — ESZOPICLONE 2 MG PO TABS: ORAL_TABLET | ORAL | 0 refills | Status: DC

## 2022-07-30 NOTE — Telephone Encounter (Signed)
Patient called requesting refill on lunesta '2mg'$  tablet. Patient has up to date contract. Last filled 06/18/2022.  Medication pended and sent to [name], NP for approval.

## 2022-08-14 ENCOUNTER — Ambulatory Visit (HOSPITAL_COMMUNITY)
Admission: RE | Admit: 2022-08-14 | Discharge: 2022-08-14 | Disposition: A | Discharge status: Home / Self Care | Payer: Medicare HMO | Source: Ambulatory Visit | Attending: Hematology and Oncology | Admitting: Hematology and Oncology

## 2022-08-14 DIAGNOSIS — E041 Nontoxic single thyroid nodule: Secondary | ICD-10-CM | POA: Diagnosis not present

## 2022-08-14 DIAGNOSIS — Z170 Estrogen receptor positive status [ER+]: Secondary | ICD-10-CM | POA: Diagnosis not present

## 2022-08-14 DIAGNOSIS — C50411 Malignant neoplasm of upper-outer quadrant of right female breast: Secondary | ICD-10-CM | POA: Insufficient documentation

## 2022-08-23 NOTE — Progress Notes (Signed)
HEMATOLOGY-ONCOLOGY TELEPHONE VISIT PROGRESS NOTE  I connected with our patient on 08/27/22 at  2:15 PM EDT by telephone and verified that I am speaking with the correct person using two identifiers.  I discussed the limitations, risks, security and privacy concerns of performing an evaluation and management service by telephone and the availability of in person appointments.  I also discussed with the patient that there may be a patient responsible charge related to this service. The patient expressed understanding and agreed to proceed.   History of Present Illness: Michele Page is a 66 y.o with right lumpectomy on letrozole. She presents to the clinic today via phone. US Thyroid came back as thyroid nodule that requires a biopsy.  Oncology History  Breast cancer of upper-outer quadrant of left female breast ([place])  03/31/2016 Initial Diagnosis   Screening detected left breast mass, 2 nodules, 1.9 x 1.6 x 0.8 cm= fat necrosis; 8 x 7 x 7 mm= grade 2 IDC ER 100%, PR 100%, HER-2 negative ratio 1.39, Ki-67 20%   04/17/2016 Surgery   Left lumpectomy ([name]): IDC grade 2, 1.3 cm, with DCIS, margins negative, 0/4 lymph nodes negative, T1 cN0 stage IA pathologic stage, ER 100%, PR 100%, HER-2 negative ratio 1.39, Ki-67 20% Oncotype DX score 20, 13% ROR, intermediate risk   04/17/2016 Oncotype testing   Recurrence score: 20; ROR 15% (intermediate risk)    05/27/2016 - 07/13/2016 Radiation Therapy   Adjuvant radiation therapy [facility]): Left breast treated with breath hold to 50.4 Gy in 28 fractions at 1.8 Gy/fraction.  Left breast boosted to 10 Gy in 5 fractions at 2 Gy/fraction   07/23/2016 -  Anti-estrogen oral therapy   Anastrozole 1 mg switched to letrozole 04/22/2017 due to hair loss, switch to tamoxifen 10/22/2017 due to hair loss from letrozole as well   Malignant neoplasm of upper-outer quadrant of right breast in female, estrogen receptor positive ([place])  08/04/2021 Initial Diagnosis   Malignant  neoplasm of upper-outer quadrant of right breast in female, estrogen receptor positive ([name])    Relapse/Recurrence   2.2 cm ILC ER 100%, PR 90%, Her 2 Neg, Ki 67: 25%, Size 2.5 cm   08/04/2021 Cancer Staging   Staging form: Breast, AJCC 8th Edition - Clinical: Stage IB (cT2, cN0, cM0, G2, ER+, PR+, HER2-) - Signed by Nicholas Lose, MD on 08/04/2021 Stage prefix: Initial diagnosis Histologic grading system: 3 grade system   09/02/2021 Surgery   Right lumpectomy: Grade 2 ILC 3.1 cm, margins negative, LVI present, 4/6 lymph nodes macrometastases, 2 lymph nodes isolated tumor cells, ER 100%, PR 90%, Ki-67 25%, HER2 negative   10/17/2021 - 01/30/2022 Chemotherapy   Patient is on Treatment Plan : BREAST Adjuvant CMF IV q21d      Genetic Testing   Negative genetic testing. No pathogenic variants identified on the [employer] CustomNext+RNA panel. The report date is 03/04/2022.  The CustomNext-Cancer+RNAinsight panel offered by [employer] [facility] includes sequencing and rearrangement analysis for the following 47 genes:  APC, ATM, AXIN2, BARD1, BMPR1A, BRCA1, BRCA2, BRIP1, CDH1, CDK4, CDKN2A, CHEK2, DICER1, EPCAM, GREM1, HOXB13, MEN1, MLH1, MSH2, MSH3, MSH6, MUTYH, NBN, NF1, NF2, NTHL1, PALB2, PMS2, POLD1, POLE, PTEN, RAD51C, RAD51D, RECQL, RET, SDHA, SDHAF2, SDHB, SDHC, SDHD, SMAD4, SMARCA4, STK11, TP53, TSC1, TSC2, and VHL.  RNA data is routinely analyzed for use in variant interpretation for all genes.     REVIEW OF SYSTEMS:   Constitutional: Denies fevers, chills or abnormal weight loss All other systems were reviewed with the patient and  are negative. Observations/Objective:     Assessment Plan:  Malignant neoplasm of upper-outer quadrant of right breast in female, estrogen receptor positive ([place]) 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, 1.3 cm, with DCIS, margins negative, 0/4 lymph nodes negative, T1 cN0 stage IA pathologic stage, ER 100%, PR 100%, HER-2 negative ratio 1.39, Ki-67 20% (Originally 2  nodules were detected on screening mammogram 1.9 cm= fat necrosis and 8 mm IDC) Oncotype DX score 20, 13% risk of recurrence, intermediate risk Adjuvant radiation therapy started 07/20/2017completed 07/13/2016     09/02/2021:Right lumpectomy: Grade 2 ILC 3.1 cm, margins negative, LVI present, 4/6 lymph nodes macrometastases, 2 lymph nodes isolated tumor cells, ER 100%, PR 90%, Ki-67 25%, HER2 negative   Treatment plan/summary: 1. systemic chemotherapy with CMF x6 cycles completed 01/30/2022 2. adjuvant radiation therapy completing 04/09/2022 3.  For the continued antiestrogen therapy (previously anastrozole started 07/23/2016 switched to letrozole 2018 switched to tamoxifen 2018, switched back to anastrozole) hair loss was the main problem, may consider exemestane ------------------------------------------------------------------------------------------------------------------------------- Current treatment: Letrozole (because Signatera testing came back positive).  Patient could not tolerate Verzinio and she discontinued it.   07/10/2022: CT CAP: No evidence of metastatic disease, tiny pulmonary nodules nonspecific heterogeneous 2.2 cm left thyroid nodule   Thyroid nodule: thyroid ultrasound 08/14/2022: 2.6 cm nodule left thyroid warrants biopsy  Signatera: 06/12/2022: Positive  CT CAP 07/10/2022: No metastatic disease.  Tiny pulmonary nodules nonspecific.    Return to clinic in 3 months for follow-up    I discussed the assessment and treatment plan with the patient. The patient was provided an opportunity to ask questions and all were answered. The patient agreed with the plan and demonstrated an understanding of the instructions. The patient was advised to call back or seek an in-person evaluation if the symptoms worsen or if the condition fails to improve as anticipated.   I provided 12 minutes of non-face-to-face time during this encounter.  This includes time for charting and coordination  of care   Harriette Ohara, MD  I Gardiner Coins am scribing for Dr. Lindi Adie  I have reviewed the above documentation for accuracy and completeness, and I agree with the above.

## 2022-08-27 ENCOUNTER — Inpatient Hospital Stay: Payer: Medicare HMO | Attending: Hematology and Oncology | Admitting: Hematology and Oncology

## 2022-08-27 DIAGNOSIS — Z170 Estrogen receptor positive status [ER+]: Secondary | ICD-10-CM

## 2022-08-27 DIAGNOSIS — E041 Nontoxic single thyroid nodule: Secondary | ICD-10-CM | POA: Diagnosis not present

## 2022-08-27 DIAGNOSIS — C50411 Malignant neoplasm of upper-outer quadrant of right female breast: Secondary | ICD-10-CM | POA: Diagnosis not present

## 2022-08-27 NOTE — Assessment & Plan Note (Signed)
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, 1.3 cm, with DCIS, margins negative, 0/4 lymph nodes negative, T1 cN0 stage IA pathologic stage, ER 100%, PR 100%, HER-2 negative ratio 1.39, Ki-67 20% (Originally 2 nodules were detected on screening mammogram 1.9 cm= fat necrosis and 8 mm IDC) Oncotype DX score 20, 13% risk of recurrence, intermediate risk Adjuvant radiation therapy started 07/20/2017completed 07/13/2016   09/02/2021:Right lumpectomy: Grade 2 ILC 3.1 cm, margins negative, LVI present, 4/6 lymph nodes macrometastases, 2 lymph nodes isolated tumor cells, ER 100%, PR 90%, Ki-67 25%, HER2 negative  Treatment plan/summary: 1.systemic chemotherapy with CMF x6cyclescompleted 01/30/2022 2.adjuvant radiation therapycompleting 04/09/2022 3.For the continued antiestrogen therapy (previously anastrozole started 07/23/2016 switched to letrozole 2018 switched to tamoxifen 2018, switched back to anastrozole) hair loss was the main problem, may consider exemestane ------------------------------------------------------------------------------------------------------------------------------- Current treatment: Letrozole (because Signatera testing came back positive).  Patient could not tolerate Verzinio and she discontinued it.  07/10/2022: CT CAP: No evidence of metastatic disease, tiny pulmonary nodules nonspecific heterogeneous 2.2 cm left thyroid nodule  Thyroid nodule: thyroid ultrasound 08/14/2022: 2.6 cm nodule left thyroid warrants biopsy  Signatera: 06/12/2022: Positive CT CAP 07/10/2022: No metastatic disease.  Tiny pulmonary nodules nonspecific.  We will need to continue to monitor her with scans every 6 months.

## 2022-08-28 ENCOUNTER — Telehealth: Payer: Self-pay | Admitting: Hematology and Oncology

## 2022-08-28 NOTE — Telephone Encounter (Signed)
Scheduled appointment per 10/12 los. Left voicemail.

## 2022-09-02 DIAGNOSIS — Z170 Estrogen receptor positive status [ER+]: Secondary | ICD-10-CM | POA: Diagnosis not present

## 2022-09-02 DIAGNOSIS — C50411 Malignant neoplasm of upper-outer quadrant of right female breast: Secondary | ICD-10-CM | POA: Diagnosis not present

## 2022-09-09 ENCOUNTER — Other Ambulatory Visit: Payer: Self-pay

## 2022-09-09 DIAGNOSIS — E7800 Pure hypercholesterolemia, unspecified: Secondary | ICD-10-CM

## 2022-09-09 MED ORDER — ATORVASTATIN CALCIUM 20 MG PO TABS: 20.0000 mg | ORAL_TABLET | Freq: Every day | ORAL | 1 refills | Status: DC

## 2022-09-11 ENCOUNTER — Other Ambulatory Visit: Payer: Self-pay | Admitting: *Deleted

## 2022-09-11 ENCOUNTER — Other Ambulatory Visit: Payer: Self-pay | Admitting: Hematology and Oncology

## 2022-09-11 DIAGNOSIS — F5101 Primary insomnia: Secondary | ICD-10-CM

## 2022-09-11 DIAGNOSIS — Z9889 Other specified postprocedural states: Secondary | ICD-10-CM

## 2022-09-11 LAB — SIGNATERA ONLY (NATERA MANAGED)
SIGNATERA MTM READOUT: 68.11 MTM/ml — AB
SIGNATERA TEST RESULT: POSITIVE — AB

## 2022-09-11 MED ORDER — ESZOPICLONE 2 MG PO TABS: ORAL_TABLET | ORAL | 3 refills | Status: DC

## 2022-09-11 NOTE — Telephone Encounter (Signed)
Patient requested refill.  Pended Rx and sent to [facility] for approval.  Epic LR: 07/30/2022

## 2022-09-14 ENCOUNTER — Ambulatory Visit (HOSPITAL_COMMUNITY)
Admission: RE | Admit: 2022-09-14 | Discharge: 2022-09-14 | Disposition: A | Discharge status: Home / Self Care | Payer: Medicare HMO | Source: Ambulatory Visit | Attending: Hematology and Oncology | Admitting: Hematology and Oncology

## 2022-09-14 DIAGNOSIS — E041 Nontoxic single thyroid nodule: Secondary | ICD-10-CM | POA: Insufficient documentation

## 2022-09-14 MED ORDER — LIDOCAINE HCL (PF) 1 % IJ SOLN: 5.0000 mL | Freq: Once | INTRAMUSCULAR | Status: DC

## 2022-09-14 MED ORDER — LIDOCAINE HCL (PF) 1 % IJ SOLN
INTRAMUSCULAR | Status: AC
  Administered 2022-09-14: 5 mL via INTRADERMAL
  Filled 2022-09-14: qty 30

## 2022-09-15 ENCOUNTER — Telehealth: Payer: Self-pay | Admitting: Hematology and Oncology

## 2022-09-15 NOTE — Telephone Encounter (Signed)
Scheduled appointment per 10/27 staff message. Left voicemail.

## 2022-09-16 LAB — CYTOLOGY - NON PAP

## 2022-09-18 ENCOUNTER — Inpatient Hospital Stay: Payer: Medicare HMO | Attending: Hematology and Oncology | Admitting: Hematology and Oncology

## 2022-09-18 ENCOUNTER — Telehealth: Payer: Self-pay | Admitting: Pharmacy Technician

## 2022-09-18 ENCOUNTER — Other Ambulatory Visit (HOSPITAL_COMMUNITY): Payer: Self-pay

## 2022-09-18 ENCOUNTER — Telehealth: Payer: Self-pay

## 2022-09-18 VITALS — BP 141/74 | HR 86 | Temp 97.3°F | Resp 18 | Ht 62.0 in | Wt 204.7 lb

## 2022-09-18 DIAGNOSIS — Z170 Estrogen receptor positive status [ER+]: Secondary | ICD-10-CM | POA: Insufficient documentation

## 2022-09-18 DIAGNOSIS — Z79811 Long term (current) use of aromatase inhibitors: Secondary | ICD-10-CM | POA: Insufficient documentation

## 2022-09-18 DIAGNOSIS — C50411 Malignant neoplasm of upper-outer quadrant of right female breast: Secondary | ICD-10-CM | POA: Diagnosis not present

## 2022-09-18 DIAGNOSIS — M79601 Pain in right arm: Secondary | ICD-10-CM | POA: Diagnosis not present

## 2022-09-18 MED ORDER — PALBOCICLIB 100 MG PO CAPS
100.0000 mg | ORAL_CAPSULE | Freq: Every day | ORAL | 10 refills | Status: DC
  Filled 2022-09-18: qty 21, 21d supply, fill #0

## 2022-09-18 NOTE — Progress Notes (Signed)
Patient Care Team: [name], NP as PCP - General (Geriatric Medicine) Lorretta Harp, MD as PCP - Cardiology (Cardiology) Nicholas Lose, MD as Consulting Physician (Oncology) Erroll Luna, MD as Consulting Physician (General Surgery)  DIAGNOSIS:  Encounter Diagnosis  Name Primary?   Malignant neoplasm of upper-outer quadrant of right breast in female, estrogen receptor positive ([place]) Yes    SUMMARY OF ONCOLOGIC HISTORY: Oncology History  Breast cancer of upper-outer quadrant of left female breast ([place])  03/31/2016 Initial Diagnosis   Screening detected left breast mass, 2 nodules, 1.9 x 1.6 x 0.8 cm= fat necrosis; 8 x 7 x 7 mm= grade 2 IDC ER 100%, PR 100%, HER-2 negative ratio 1.39, Ki-67 20%   04/17/2016 Surgery   Left lumpectomy ([name]): IDC grade 2, 1.3 cm, with DCIS, margins negative, 0/4 lymph nodes negative, T1 cN0 stage IA pathologic stage, ER 100%, PR 100%, HER-2 negative ratio 1.39, Ki-67 20% Oncotype DX score 20, 13% ROR, intermediate risk   04/17/2016 Oncotype testing   Recurrence score: 20; ROR 15% (intermediate risk)    05/27/2016 - 07/13/2016 Radiation Therapy   Adjuvant radiation therapy [facility]): Left breast treated with breath hold to 50.4 Gy in 28 fractions at 1.8 Gy/fraction.  Left breast boosted to 10 Gy in 5 fractions at 2 Gy/fraction   07/23/2016 -  Anti-estrogen oral therapy   Anastrozole 1 mg switched to letrozole 04/22/2017 due to hair loss, switch to tamoxifen 10/22/2017 due to hair loss from letrozole as well   Malignant neoplasm of upper-outer quadrant of right breast in female, estrogen receptor positive ([place])  08/04/2021 Initial Diagnosis   Malignant neoplasm of upper-outer quadrant of right breast in female, estrogen receptor positive ([name])    Relapse/Recurrence   2.2 cm ILC ER 100%, PR 90%, Her 2 Neg, Ki 67: 25%, Size 2.5 cm   08/04/2021 Cancer Staging   Staging form: Breast, AJCC 8th Edition - Clinical: Stage IB (cT2, cN0, cM0, G2,  ER+, PR+, HER2-) - Signed by Nicholas Lose, MD on 08/04/2021 Stage prefix: Initial diagnosis Histologic grading system: 3 grade system   09/02/2021 Surgery   Right lumpectomy: Grade 2 ILC 3.1 cm, margins negative, LVI present, 4/6 lymph nodes macrometastases, 2 lymph nodes isolated tumor cells, ER 100%, PR 90%, Ki-67 25%, HER2 negative   10/17/2021 - 01/30/2022 Chemotherapy   Patient is on Treatment Plan : BREAST Adjuvant CMF IV q21d      Genetic Testing   Negative genetic testing. No pathogenic variants identified on the [employer] CustomNext+RNA panel. The report date is 03/04/2022.  The CustomNext-Cancer+RNAinsight panel offered by [facility] includes sequencing and rearrangement analysis for the following 47 genes:  APC, ATM, AXIN2, BARD1, BMPR1A, BRCA1, BRCA2, BRIP1, CDH1, CDK4, CDKN2A, CHEK2, DICER1, EPCAM, GREM1, HOXB13, MEN1, MLH1, MSH2, MSH3, MSH6, MUTYH, NBN, NF1, NF2, NTHL1, PALB2, PMS2, POLD1, POLE, PTEN, RAD51C, RAD51D, RECQL, RET, SDHA, SDHAF2, SDHB, SDHC, SDHD, SMAD4, SMARCA4, STK11, TP53, TSC1, TSC2, and VHL.  RNA data is routinely analyzed for use in variant interpretation for all genes.     CHIEF COMPLIANT:   INTERVAL HISTORY: SYRETTA KOCHEL is a   ALLERGIES:  has No Known Allergies.  MEDICATIONS:  Current Outpatient Medications  Medication Sig Dispense Refill   aspirin EC 81 MG tablet Take 1 tablet (81 mg total) by mouth daily. 30 tablet 6   atorvastatin (LIPITOR) 20 MG tablet Take 1 tablet (20 mg total) by mouth daily. 90 tablet 1   eszopiclone (LUNESTA) 2 MG TABS tablet  Take one tablet by mouth at bedtime as needed for sleep (take immediately before bedtime) 30 tablet 3   labetalol (NORMODYNE) 300 MG tablet TAKE 2 TABLETS THREE TIMES DAILY 540 tablet 2   letrozole (FEMARA) 2.5 MG tablet Take 1 tablet (2.5 mg total) by mouth daily. 90 tablet 3   NIFEdipine (PROCARDIA XL/NIFEDICAL XL) 60 MG 24 hr tablet Take 1 tablet (60 mg total) by mouth 2 (two) times daily. 180 tablet 3    No current facility-administered medications for this visit.    PHYSICAL EXAMINATION: ECOG PERFORMANCE STATUS: 1 - Symptomatic but completely ambulatory  Vitals:   09/18/22 1147  BP: (!) 141/74  Pulse: 86  Resp: 18  Temp: (!) 97.3 F (36.3 C)  SpO2: 98%   Filed Weights   09/18/22 1147  Weight: 204 lb 11.2 oz (92.9 kg)    LABORATORY DATA:  I have reviewed the data as listed    Latest Ref Rng & Units 07/06/2022   11:52 AM 06/05/2022    1:29 PM 05/28/2022   12:03 PM  CMP  Glucose 70 - 99 mg/dL 130  136  122   BUN 8 - 23 mg/dL _0 Creatinine 0.44 - 1.00 mg/dL 1.52  1.77  1.85   Sodium 135 - 145 mmol/L 140  141  141   Potassium 3.5 - 5.1 mmol/L 3.4  3.8  3.8   Chloride 98 - 111 mmol/L 106  106  109   CO2 22 - 32 mmol/L _1 Calcium 8.9 - 10.3 mg/dL 9.7  9.8  9.4   Total Protein 6.5 - 8.1 g/dL 6.6   7.1   Total Bilirubin 0.3 - 1.2 mg/dL 0.4   0.4   Alkaline Phos 38 - 126 U/L 80   92   AST 15 - 41 U/L 23   22   ALT 0 - 44 U/L 19   15     Lab Results  Component Value Date   WBC 5.2 07/06/2022   HGB 11.2 (L) 07/06/2022   HCT 31.6 (L) 07/06/2022   MCV 92.7 07/06/2022   PLT 246 07/06/2022   NEUTROABS 3.6 07/06/2022    ASSESSMENT & PLAN:  Malignant neoplasm of upper-outer quadrant of right breast in female, estrogen receptor positive ([place]) 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, 1.3 cm, with DCIS, margins negative, 0/4 lymph nodes negative, T1 cN0 stage IA pathologic stage, ER 100%, PR 100%, HER-2 negative ratio 1.39, Ki-67 20% (Originally 2 nodules were detected on screening mammogram 1.9 cm= fat necrosis and 8 mm IDC) Oncotype DX score 20, 13% risk of recurrence, intermediate risk Adjuvant radiation therapy started 07/20/2017completed 07/13/2016     09/02/2021:Right lumpectomy: Grade 2 ILC 3.1 cm, margins negative, LVI present, 4/6 lymph nodes macrometastases, 2 lymph nodes isolated tumor cells, ER 100%, PR 90%, Ki-67 25%, HER2 negative    Treatment plan/summary: 1. systemic chemotherapy with CMF x6 cycles completed 01/30/2022 2. adjuvant radiation therapy completing 04/09/2022 3.  For the continued antiestrogen therapy (previously anastrozole started 07/23/2016 switched to letrozole 2018 switched to tamoxifen 2018, switched back to anastrozole) hair loss was the main problem, may consider exemestane ------------------------------------------------------------------------------------------------------------------------------- Current treatment: Letrozole (because Signatera testing came back positive).  Patient could not tolerate Verzinio and she discontinued it.   07/10/2022: CT CAP: No evidence of metastatic disease, tiny pulmonary nodules nonspecific heterogeneous 2.2 cm left thyroid nodule   Thyroid nodule: thyroid ultrasound 08/14/2022: 2.6 cm nodule.  Biopsy 38/46/6599: Benign follicular nodule   Signatera:  06/12/2022: Positive (0.52) 09/02/2022: Positive (68.11) CT CAP 07/10/2022: No metastatic disease.  Tiny pulmonary nodules nonspecific. The rise in the [place] testing is concerning for progression of disease.  Recommend adding Ibrance to letrozole. I sent a prescription of 100 mg Ibrance 21 days on 7 days off.  Ibrance: I discussed the risks and benefits of Ibrance including myelosuppression especially neutropenia and with that risk of infection, there is risk of pulmonary embolism and mild peripheral neuropathy as well. Fatigue, nausea, diarrhea, decreased appetite as well as alopecia and thrombocytopenia are also potential side effects of Ibrance  Return to clinic in 1 month and follow-up with Jenny Reichmann along with labs   No orders of the defined types were placed in this encounter.  The patient has a good understanding of the overall plan. she agrees with it. she will call with any problems that may develop before the next visit here. Total time spent: 30 mins including face to face time and time spent for planning,  charting and co-ordination of care   Harriette Ohara, MD 09/18/22

## 2022-09-18 NOTE — Telephone Encounter (Signed)
Oral Oncology Patient Advocate Encounter  Prior Authorization for [name] has been approved.    PA# [id number] Effective dates: 09/18/22 through 03/19/23  Patients co-pay is $3,167.36.    Lady Deutscher, CPhT-Adv Oncology Pharmacy Patient [place] Direct Number: [phone]  Fax: [phone]

## 2022-09-18 NOTE — Telephone Encounter (Addendum)
Oral Oncology Pharmacist Encounter  Received new prescription for palbociclib [name]) for the treatment of HR positive, HER2 negative breast cancer in conjunction with letrozole, planned duration until disease progression or unacceptable toxicity. Patient had increase in the [place] testing showing potential that patient has had disease progression resulting in a need to add Ibrance.  Labs from 07/06/22 (CBC, CMP) assessed, no interventions needed (creatinine elevated although no dose reductions required- patient has not been on any treatment since. Prescription dose and frequency assessed- per MD dose decreased to 173m for tolerability.  Current medication list in Epic reviewed,no significant DDIs with Ibrance identified.  Evaluated chart and no patient barriers to medication adherence noted.   Patient agreement for treatment documented in MD note on 09/18/2022.  [facility].  Oral Oncology Clinic will continue to follow for insurance authorization, copayment issues, initial counseling and start date.  KDrema Halon PharmD Hematology/Oncology Clinical Pharmacist [phone]/01/2022 1:33 PM

## 2022-09-18 NOTE — Telephone Encounter (Signed)
Oral Oncology Patient Advocate Encounter   Began application for assistance for Ibrance through [employer].   Application will be submitted upon completion of necessary supporting documentation.   [employer] phone number [phone].   I will continue to check the status until final determination.   Lady Deutscher, CPhT-Adv Oncology Pharmacy Patient [place] Direct Number: [phone]  Fax: [phone]

## 2022-09-18 NOTE — Assessment & Plan Note (Signed)
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 systemic chemotherapy with CMF x6 cycles completed 01/30/2022 2. adjuvant radiation therapy completing 04/09/2022 3.  For the continued antiestrogen therapy (previously anastrozole started 07/23/2016 switched to letrozole 2018 switched to tamoxifen 2018, switched back to anastrozole) hair loss was the main problem, may consider exemestane ------------------------------------------------------------------------------------------------------------------------------- Current treatment: Letrozole (because Signatera testing came back positive).  Patient could not tolerate Verzinio and she discontinued it.   07/10/2022: CT CAP: No evidence of metastatic disease, tiny pulmonary nodules nonspecific heterogeneous 2.2 cm left thyroid nodule   Thyroid nodule: thyroid ultrasound 08/14/2022: 2.6 cm nodule.  Biopsy 72/07/4708: Benign follicular nodule   Signatera:  06/12/2022: Positive (0.52) 09/02/2022: Positive (68.11) CT CAP 07/10/2022: No metastatic disease.  Tiny pulmonary nodules nonspecific. The rise in the [place] testing is concerning for progression of disease.  Recommend switching her antiestrogen therapy to Faslodex. Scans in 1 month and follow-up

## 2022-09-18 NOTE — Telephone Encounter (Signed)
Oral Oncology Patient Advocate Encounter   Received notification that prior authorization for Michele Page is required.   PA submitted on 09/18/2022 Key [id number] Status is pending     Michele Page, CPhT-Adv Oncology Pharmacy Patient [place] Direct Number: [phone]  Fax: [phone]

## 2022-09-22 ENCOUNTER — Ambulatory Visit
Admission: RE | Admit: 2022-09-22 | Discharge: 2022-09-22 | Disposition: A | Discharge status: Home / Self Care | Payer: Medicare HMO | Source: Ambulatory Visit | Attending: Hematology and Oncology | Admitting: Hematology and Oncology

## 2022-09-22 ENCOUNTER — Telehealth: Payer: Self-pay | Admitting: Pharmacy Technician

## 2022-09-22 ENCOUNTER — Encounter: Payer: Self-pay | Admitting: Hematology and Oncology

## 2022-09-22 ENCOUNTER — Telehealth: Payer: Self-pay | Admitting: Pharmacist

## 2022-09-22 ENCOUNTER — Other Ambulatory Visit (HOSPITAL_COMMUNITY): Payer: Self-pay

## 2022-09-22 DIAGNOSIS — C50411 Malignant neoplasm of upper-outer quadrant of right female breast: Secondary | ICD-10-CM

## 2022-09-22 DIAGNOSIS — Z9889 Other specified postprocedural states: Secondary | ICD-10-CM

## 2022-09-22 DIAGNOSIS — Z170 Estrogen receptor positive status [ER+]: Secondary | ICD-10-CM

## 2022-09-22 DIAGNOSIS — R92333 Mammographic heterogeneous density, bilateral breasts: Secondary | ICD-10-CM | POA: Diagnosis not present

## 2022-09-22 MED ORDER — PALBOCICLIB 100 MG PO TABS
100.0000 mg | ORAL_TABLET | Freq: Every day | ORAL | 10 refills | Status: DC
  Filled 2022-09-22: qty 21, 28d supply, fill #0
  Filled 2022-10-13: qty 21, 28d supply, fill #1
  Filled 2022-11-11: qty 21, 28d supply, fill #2

## 2022-09-22 NOTE — Telephone Encounter (Signed)
Oral Oncology Patient Advocate Encounter   Met with patient in lobby to collect paperwork requested by [employer].  Application is pending MD signatures.    [employer] phone number [phone].   I will continue to check the status until final determination.   Lady Deutscher, CPhT-Adv Oncology Pharmacy Patient [place] Direct Number: [phone]  Fax: [phone]

## 2022-09-22 NOTE — Telephone Encounter (Signed)
Oral Oncology Patient Advocate Encounter  Michele Page funding became available and patient was enrolled.  Application to PAP will be held until further notice.  I have left a message with the patient.  Michele Page, CPhT-Adv Oncology Pharmacy Patient [place] Direct Number: [phone]  Fax: [phone]

## 2022-09-22 NOTE — Telephone Encounter (Signed)
Oral Chemotherapy Pharmacist Encounter  Patient will have her medication delivered by [facility] on 09/24/22. She would like to wait until 09/28/22 to take her first dose.   Patient Education I spoke with patient for overview of new oral chemotherapy medication: palbociclib [name]) for the treatment of HR positive, HER2 negative breast cancer in conjunction with letrozole, planned duration until disease progression or unacceptable toxicity.    Counseled patient on administration, dosing, side effects, monitoring, drug-food interactions, safe handling, storage, and disposal. Patient will take 1 tablet (100 mg total) by mouth daily. Take for 21 days on, 7 days off, repeat every 28 days..  Side effects include but not limited to: decreased wbc/hgb/plt, fatigue, nausea, mouth sores, fatigue.    Reviewed with patient importance of keeping a medication schedule and plan for any missed doses.  After discussion with patient no patient barriers to medication adherence identified.   Ms. Rackham voiced understanding and appreciation. All questions answered. Medication handout provided.  Provided patient with [facility] [place] [facility] phone number. Patient knows to call the office with questions or concerns. Oral Chemotherapy Navigation Clinic will continue to follow.  Darl Pikes, PharmD, BCPS, BCOP, CPP Hematology/Oncology Clinical Pharmacist Practitioner [place]/DB/AP [facility] [place] [facility] [phone]  09/22/2022 4:02 PM

## 2022-09-22 NOTE — Telephone Encounter (Signed)
Oral Oncology Patient Advocate Encounter   Was successful in securing patient a $16,000 grant from [employer] (PAF) to provide copayment coverage for Ibrance.  This will keep the out of pocket expense at $0.     I have spoken with the patient.    The billing information is as follows and has been shared with [place].   RxBin: [id number] PCN:  [id number] Member ID: [id number]  Group ID: [id number] Dates of Eligibility: 03/26/2022 through 09/22/2023  Lady Deutscher, CPhT-Adv Oncology Pharmacy Patient [place] Direct Number: [phone]  Fax: [phone]

## 2022-09-23 ENCOUNTER — Other Ambulatory Visit (HOSPITAL_COMMUNITY): Payer: Self-pay

## 2022-10-06 ENCOUNTER — Ambulatory Visit: Payer: Medicare HMO | Admitting: Hematology and Oncology

## 2022-10-12 ENCOUNTER — Inpatient Hospital Stay (HOSPITAL_BASED_OUTPATIENT_CLINIC_OR_DEPARTMENT_OTHER): Payer: Medicare HMO | Admitting: Physician Assistant

## 2022-10-12 ENCOUNTER — Other Ambulatory Visit: Payer: Self-pay | Admitting: *Deleted

## 2022-10-12 ENCOUNTER — Telehealth: Payer: Self-pay

## 2022-10-12 ENCOUNTER — Telehealth: Payer: Self-pay | Admitting: *Deleted

## 2022-10-12 ENCOUNTER — Ambulatory Visit (HOSPITAL_BASED_OUTPATIENT_CLINIC_OR_DEPARTMENT_OTHER)
Admission: RE | Admit: 2022-10-12 | Discharge: 2022-10-12 | Disposition: A | Discharge status: Home / Self Care | Payer: Medicare HMO | Source: Ambulatory Visit | Attending: Physician Assistant | Admitting: Physician Assistant

## 2022-10-12 ENCOUNTER — Other Ambulatory Visit: Payer: Self-pay

## 2022-10-12 VITALS — BP 164/76 | HR 84 | Temp 98.6°F | Resp 18 | Ht 62.0 in | Wt 205.9 lb

## 2022-10-12 DIAGNOSIS — M79601 Pain in right arm: Secondary | ICD-10-CM

## 2022-10-12 DIAGNOSIS — Z170 Estrogen receptor positive status [ER+]: Secondary | ICD-10-CM | POA: Diagnosis not present

## 2022-10-12 DIAGNOSIS — C50411 Malignant neoplasm of upper-outer quadrant of right female breast: Secondary | ICD-10-CM

## 2022-10-12 DIAGNOSIS — Z79811 Long term (current) use of aromatase inhibitors: Secondary | ICD-10-CM | POA: Diagnosis not present

## 2022-10-12 NOTE — Progress Notes (Signed)
Per PA pt Vas [place] negative for DVT.  Verbal orders received to refer pt to cancer rehab for right arm lymphedema.  Referral successfully placed.

## 2022-10-12 NOTE — Progress Notes (Signed)
Symptom Management Consult note [place]    Patient Care Team: [name], NP as PCP - General (Geriatric Medicine) Lorretta Harp, MD as PCP - Cardiology (Cardiology) Nicholas Lose, MD as Consulting Physician (Oncology) Erroll Luna, MD as Consulting Physician (General Surgery)    Name of the patient: Michele Page  [id number]  Jul 31, 1956   Date of visit: 10/12/2022   Chief Complaint/Reason for visit: right arm swellnig   Current Therapy: Letrozole and Ibrance     ASSESSMENT & PLAN: Patient is a 66 y.o. female  with oncologic history of malignant neoplasm of upper-outer quadrant of right breast, ER positive followed by Dr. Lindi Adie.  I have viewed most recent oncology note and lab work.  #)Malignant neoplasm of upper-outer quadrant of right breast, ER positive -History of right lumpectomy 09/02/21 -Recently started Ibrance 09/28/22 - Next appointment with oncologist is 11/30/22   #) Right arm pain -Patient with nonpitting edema of RUE. Neurovascularly intact. No recent injury or trauma. -DVT study is negative, does show subcutaneous edema edema on dorsal aspect of right hand and distal forearm. -Exam without signs of cellulitis.  Discussed over-the-counter pain management. -Will send referral to lymphedema clinic for eval and treatment  Strict ED precautions discussed should symptoms worsen.      Heme/Onc History: Oncology History  Breast cancer of upper-outer quadrant of left female breast ([place])  03/31/2016 Initial Diagnosis   Screening detected left breast mass, 2 nodules, 1.9 x 1.6 x 0.8 cm= fat necrosis; 8 x 7 x 7 mm= grade 2 IDC ER 100%, PR 100%, HER-2 negative ratio 1.39, Ki-67 20%   04/17/2016 Surgery   Left lumpectomy ([name]): IDC grade 2, 1.3 cm, with DCIS, margins negative, 0/4 lymph nodes negative, T1 cN0 stage IA pathologic stage, ER 100%, PR 100%, HER-2 negative ratio 1.39, Ki-67 20% Oncotype DX score 20, 13% ROR,  intermediate risk   04/17/2016 Oncotype testing   Recurrence score: 20; ROR 15% (intermediate risk)    05/27/2016 - 07/13/2016 Radiation Therapy   Adjuvant radiation therapy [facility]): Left breast treated with breath hold to 50.4 Gy in 28 fractions at 1.8 Gy/fraction.  Left breast boosted to 10 Gy in 5 fractions at 2 Gy/fraction   07/23/2016 -  Anti-estrogen oral therapy   Anastrozole 1 mg switched to letrozole 04/22/2017 due to hair loss, switch to tamoxifen 10/22/2017 due to hair loss from letrozole as well   Malignant neoplasm of upper-outer quadrant of right breast in female, estrogen receptor positive ([place])  08/04/2021 Initial Diagnosis   Malignant neoplasm of upper-outer quadrant of right breast in female, estrogen receptor positive ([name])    Relapse/Recurrence   2.2 cm ILC ER 100%, PR 90%, Her 2 Neg, Ki 67: 25%, Size 2.5 cm   08/04/2021 Cancer Staging   Staging form: Breast, AJCC 8th Edition - Clinical: Stage IB (cT2, cN0, cM0, G2, ER+, PR+, HER2-) - Signed by Nicholas Lose, MD on 08/04/2021 Stage prefix: Initial diagnosis Histologic grading system: 3 grade system   09/02/2021 Surgery   Right lumpectomy: Grade 2 ILC 3.1 cm, margins negative, LVI present, 4/6 lymph nodes macrometastases, 2 lymph nodes isolated tumor cells, ER 100%, PR 90%, Ki-67 25%, HER2 negative   10/17/2021 - 01/30/2022 Chemotherapy   Patient is on Treatment Plan : BREAST Adjuvant CMF IV q21d      Genetic Testing   Negative genetic testing. No pathogenic variants identified on the [employer] CustomNext+RNA panel. The report date is 03/04/2022.  The CustomNext-Cancer+RNAinsight  panel offered by [employer] includes sequencing and rearrangement analysis for the following 47 genes:  APC, ATM, AXIN2, BARD1, BMPR1A, BRCA1, BRCA2, BRIP1, CDH1, CDK4, CDKN2A, CHEK2, DICER1, EPCAM, GREM1, HOXB13, MEN1, MLH1, MSH2, MSH3, MSH6, MUTYH, NBN, NF1, NF2, NTHL1, PALB2, PMS2, POLD1, POLE, PTEN, RAD51C, RAD51D, RECQL, RET, SDHA, SDHAF2, SDHB,  SDHC, SDHD, SMAD4, SMARCA4, STK11, TP53, TSC1, TSC2, and VHL.  RNA data is routinely analyzed for use in variant interpretation for all genes.       Interval history-: Michele Page is a 66 y.o. female with oncologic history as above presenting to [facility] today with chief complaint of progressively worsening right arm pain and swelling x2 weeks.  The swelling worsened over the last 2 days.  Patient endorses constant aching pain located in her upper arm radiating down to her hand.  Pain is not worse with movement. She has not taken anything for pain.  She rates pain currently 8 of 10 in severity.  She denies injury or trauma.  She works as a Scientist, product/process development at [employer] and describes the job as physically demanding. She denies history of similar symptoms. No chest pain, shortness of breath, palpitations, numbness or tingling.    ROS  All other systems are reviewed and are negative for acute change except as noted in the HPI.    No Known Allergies   Past Medical History:  Diagnosis Date   Anemia    past hx many yrs ago    Arthritis    lt knee   Breast cancer ([place])    Breast cancer of upper-outer quadrant of left female breast ([place]) 04/02/2016   Chronic kidney disease    CKD   Colon polyps    COPD (chronic obstructive pulmonary disease) ([name])    GERD (gastroesophageal reflux disease)    years ago- not current    Hyperlipidemia    Hypertension    Multiple sclerosis ([place])    Personal history of chemotherapy    Personal history of radiation therapy    Stroke ([place])    2013, mild cognitive deficits   Vision abnormalities      Past Surgical History:  Procedure Laterality Date   BREAST BIOPSY Left 03/31/2016   BREAST BIOPSY Right 07/24/2021   BREAST LUMPECTOMY Left 04/17/2016   BREAST LUMPECTOMY Right 09/02/2021   BREAST LUMPECTOMY WITH RADIOACTIVE SEED AND SENTINEL LYMPH NODE BIOPSY Right 09/02/2021   Procedure: RIGHT BREAST LUMPECTOMY WITH RADIOACTIVE SEED AND SENTINEL LYMPH NODE BIOPSY;   Surgeon: Erroll Luna, MD;  Location: [place] [facility];  Service: General;  Laterality: Right;   COLONOSCOPY     HYSTEROSCOPY WITH D & C N/A 12/19/2015   Procedure: DILATATION AND CURETTAGE /HYSTEROSCOPY;  Surgeon: Linda Hedges, DO;  Location: [facility];  Service: Gynecology;  Laterality: N/A;   POLYPECTOMY     PORTACATH PLACEMENT Right 10/16/2021   Procedure: INSERTION PORT-A-CATH;  Surgeon: Erroll Luna, MD;  Location: [facility];  Service: General;  Laterality: Right;   RADIOACTIVE SEED GUIDED PARTIAL MASTECTOMY WITH AXILLARY SENTINEL LYMPH NODE BIOPSY Left 04/17/2016   Procedure: RADIOACTIVE SEED GUIDED PARTIAL MASTECTOMY WITH AXILLARY SENTINEL LYMPH NODE BIOPSY;  Surgeon: Erroll Luna, MD;  Location: [place];  Service: General;  Laterality: Left;  RADIOACTIVE SEED GUIDED PARTIAL MASTECTOMY WITH AXILLARY SENTINEL LYMPH NODE BIOPSY    TUBAL LIGATION     VAGINAL DELIVERY     x3    Social History   Socioeconomic History   Marital status: Married  Spouse name: Not on file   Number of children: 3   Years of education: Not on file   Highest education level: Not on file  Occupational History   Occupation: retired  Tobacco Use   Smoking status: Former    Packs/day: 0.50    Years: 25.00    Total pack years: 12.50    Types: Cigarettes    Quit date: 11/16/2004    Years since quitting: 17.9   Smokeless tobacco: Never  Vaping Use   Vaping Use: Never used  Substance and Sexual Activity   Alcohol use: Not Currently    Comment: occas   Drug use: No   Sexual activity: Yes    Comment: 1st intercourse 2 yo-5 partners  Other Topics Concern   Not on file  Social History Narrative   Diet- N/A   Caffeine- Yes   Married- Yes   House- 2 story with 2 people   Pets- No   Current/past profession- Insurance underwriter daycare   Exercise- Yes   Living will-No   DNR-N/A   POA/HPOA-No      Works part-time at [employer] Strain: High Risk (03/25/2022)   Overall Financial Resource Strain (CARDIA)    Difficulty of Paying Living Expenses: Hard  Food Insecurity: No Food Insecurity (11/24/2017)   Hunger Vital Sign    Worried About Running Out of Food in the Last Year: Never true    [place] in the Last Year: Never true  Transportation Needs: No Transportation Needs (11/24/2017)   PRAPARE - Hydrologist (Medical): No    Lack of Transportation (Non-Medical): No  Physical Activity: Insufficiently Active (11/24/2017)   Exercise Vital Sign    Days of Exercise per Week: 1 day    Minutes of Exercise per Session: 90 min  Stress: No Stress Concern Present (11/24/2017)   [place]    Feeling of Stress : Only a little  Social Connections: Moderately Integrated (11/24/2017)   Social Connection and Isolation Panel [NHANES]    Frequency of Communication with Friends and Family: Twice a week    Frequency of Social Gatherings with Friends and Family: Once a week    Attends Religious Services: More than 4 times per year    Active Member of [employer] or Organizations: No    Attends Archivist Meetings: Never    Marital Status: Married  Human resources officer Violence: Not At Risk (11/24/2017)   Humiliation, Afraid, Rape, and Kick questionnaire    Fear of Current or Ex-Partner: No    Emotionally Abused: No    Physically Abused: No    Sexually Abused: No    Family History  Problem Relation Age of Onset   Hypertension Mother    Stroke Mother    Stroke Sister    Alcohol abuse Brother    HIV/AIDS Brother    Breast cancer Cousin        dx 32s   Colon cancer Neg Hx    Colon polyps Neg Hx    Esophageal cancer Neg Hx    Rectal cancer Neg Hx    Stomach cancer Neg Hx      Current Outpatient Medications:    aspirin EC 81 MG tablet, Take 1 tablet (81 mg total) by mouth daily., Disp: 30 tablet, Rfl: 6    atorvastatin (LIPITOR) 20 MG tablet, Take 1 tablet (20  mg total) by mouth daily., Disp: 90 tablet, Rfl: 1   eszopiclone (LUNESTA) 2 MG TABS tablet, Take one tablet by mouth at bedtime as needed for sleep (take immediately before bedtime), Disp: 30 tablet, Rfl: 3   labetalol (NORMODYNE) 300 MG tablet, TAKE 2 TABLETS THREE TIMES DAILY, Disp: 540 tablet, Rfl: 2   letrozole (FEMARA) 2.5 MG tablet, Take 1 tablet (2.5 mg total) by mouth daily., Disp: 90 tablet, Rfl: 3   NIFEdipine (PROCARDIA XL/NIFEDICAL XL) 60 MG 24 hr tablet, Take 1 tablet (60 mg total) by mouth 2 (two) times daily., Disp: 180 tablet, Rfl: 3   palbociclib (IBRANCE) 100 MG tablet, Take 1 tablet (100 mg total) by mouth daily. Take for 21 days on, 7 days off, repeat every 28 days., Disp: 21 tablet, Rfl: 10  PHYSICAL EXAM: ECOG FS:1 - Symptomatic but completely ambulatory    Vitals:   10/12/22 1345  BP: (!) 164/76  Pulse: 84  Resp: 18  Temp: 98.6 F (37 C)  TempSrc: Oral  SpO2: 97%  Weight: 205 lb 14.4 oz (93.4 kg)  Height: _0  (1.575 m)   Physical Exam Vitals and nursing note reviewed.  Constitutional:      Appearance: She is well-developed. She is not ill-appearing or toxic-appearing.  HENT:     Head: Normocephalic.     Nose: Nose normal.  Eyes:     Conjunctiva/sclera: Conjunctivae normal.  Neck:     Vascular: No JVD.  Cardiovascular:     Rate and Rhythm: Normal rate and regular rhythm.     Pulses: Normal pulses.          Radial pulses are 2+ on the right side and 2+ on the left side.     Heart sounds: Normal heart sounds.  Pulmonary:     Effort: Pulmonary effort is normal.     Breath sounds: Normal breath sounds.  Chest:     Comments: Healed surgical incision on right upper chest from port removal. Abdominal:     General: There is no distension.  Musculoskeletal:     Cervical back: Normal range of motion.     Comments: Swelling noted to right distal forearm and dorsal aspect of right hand.  Compartments  are soft in right upper extremity.  Full range of motion of shoulder, elbow and wrist.  No wound or erythema on right upper extremity.  Neurovascular intact distally.  Skin:    General: Skin is warm and dry.     Capillary Refill: Capillary refill takes less than 2 seconds.  Neurological:     Mental Status: She is oriented to person, place, and time.     Comments: Strong equal grip strength in bilateral upper extremities.  Sensation intact.         LABORATORY DATA: I have reviewed the data as listed    Latest Ref Rng & Units 07/06/2022   11:52 AM 05/28/2022   12:03 PM 05/14/2022   12:33 PM  CBC  WBC 4.0 - 10.5 K/uL 5.2  3.3  3.2   Hemoglobin 12.0 - 15.0 g/dL 11.2  11.0  11.0   Hematocrit 36.0 - 46.0 % 31.6  31.0  31.7   Platelets 150 - 400 K/uL 246  267  231         Latest Ref Rng & Units 07/06/2022   11:52 AM 06/05/2022    1:29 PM 05/28/2022   12:03 PM  CMP  Glucose 70 - 99 mg/dL 130  136  122  BUN 8 - 23 mg/dL _0 Creatinine 0.44 - 1.00 mg/dL 1.52  1.77  1.85   Sodium 135 - 145 mmol/L 140  141  141   Potassium 3.5 - 5.1 mmol/L 3.4  3.8  3.8   Chloride 98 - 111 mmol/L 106  106  109   CO2 22 - 32 mmol/L _1 Calcium 8.9 - 10.3 mg/dL 9.7  9.8  9.4   Total Protein 6.5 - 8.1 g/dL 6.6   7.1   Total Bilirubin 0.3 - 1.2 mg/dL 0.4   0.4   Alkaline Phos 38 - 126 U/L 80   92   AST 15 - 41 U/L 23   22   ALT 0 - 44 U/L 19   15        RADIOGRAPHIC STUDIES (from last 24 hours if applicable) I have personally reviewed the radiological images as listed and agreed with the findings in the report. VAS [place] UPPER EXTREMITY VENOUS DUPLEX  Result Date: 10/12/2022 UPPER VENOUS STUDY  Patient Name:  RANIA PROTHERO  Date of Exam:   10/12/2022 Medical Rec #: [id number]     Accession #:    [id number] Date of Birth: 08/09/1956     Patient Gender: F Patient Age:   88 years Exam Location:  [facility] Procedure:      VAS [place] UPPER EXTREMITY VENOUS DUPLEX Referring Phys:  Sherol Dade --------------------------------------------------------------------------------  Indications: Edema Risk Factors: HX of breast cancer (right). Comparison Study: No previous exams Performing Technologist: Jody Hill RVT, RDMS  Examination Guidelines: A complete evaluation includes B-mode imaging, spectral Doppler, color Doppler, and power Doppler as needed of all accessible portions of each vessel. Bilateral testing is considered an integral part of a complete examination. Limited examinations for reoccurring indications may be performed as noted.  Right Findings: +----------+------------+---------+-----------+----------+-------+ RIGHT     CompressiblePhasicitySpontaneousPropertiesSummary +----------+------------+---------+-----------+----------+-------+ IJV           Full       Yes       Yes                      +----------+------------+---------+-----------+----------+-------+ Subclavian    Full       Yes       Yes                      +----------+------------+---------+-----------+----------+-------+ Axillary      Full       Yes       Yes                      +----------+------------+---------+-----------+----------+-------+ Brachial      Full       Yes       Yes                      +----------+------------+---------+-----------+----------+-------+ Radial        Full                                          +----------+------------+---------+-----------+----------+-------+ Ulnar         Full                                          +----------+------------+---------+-----------+----------+-------+  Cephalic      Full                                          +----------+------------+---------+-----------+----------+-------+ Basilic       Full       Yes       Yes                      +----------+------------+---------+-----------+----------+-------+  Left Findings: +----------+------------+---------+-----------+----------+-------+  LEFT      CompressiblePhasicitySpontaneousPropertiesSummary +----------+------------+---------+-----------+----------+-------+ Subclavian    Full       Yes       Yes                      +----------+------------+---------+-----------+----------+-------+  Summary:  Right: No evidence of deep vein thrombosis in the upper extremity. No evidence of superficial vein thrombosis in the upper extremity. Subcutaneous edema seen in area of hand (dorsal) and distal forearm.  Left: No evidence of thrombosis in the subclavian.  *See table(s) above for measurements and observations.  Diagnosing physician: Monica Martinez MD Electronically signed by Monica Martinez MD on 10/12/2022 at 4:18:35 PM.    Final         Visit Diagnosis: 1. Right arm pain   2. Malignant neoplasm of upper-outer quadrant of right breast in female, estrogen receptor positive ([place])      No orders of the defined types were placed in this encounter.   All questions were answered. The patient knows to call the clinic with any problems, questions or concerns. No barriers to learning was detected.  I have spent a total of 20 minutes minutes of face-to-face and non-face-to-face time, preparing to see the patient, obtaining and/or reviewing separately obtained history, performing a medically appropriate examination, counseling and educating the patient, ordering tests, documenting clinical information in the electronic health record, and care coordination (communications with other health care professionals or caregivers).    Thank you for allowing me to participate in the care of this patient.    Barrie Folk, PA-C Department of Hematology/Oncology [facility] at [facility] Phone: [phone]  [phone]    10/12/2022 5:02 PM

## 2022-10-12 NOTE — Telephone Encounter (Signed)
DVT study results given to pt. Pt stated understanding. Advised pt to take tylenol for pain, follow-up wit lymphedema clinic, and elevate right are as much as possible. Pt stated understanding and will call the clinic or go to the ED if worsening symptoms occur.

## 2022-10-12 NOTE — Progress Notes (Signed)
RUE venous duplex has been completed.  Preliminary results given to [facility], PA-C.   Results can be found under chart review under CV PROC. 10/12/2022 3:23 PM Shaheim Mahar RVT, RDMS

## 2022-10-12 NOTE — Telephone Encounter (Signed)
Received call from pt with complaint of right upper extremity swelling over the last 2 weeks. Pt denies recent injury, trauma, redness or warmth to extremity.  Pt requesting appt for further evaluation.  Appt scheduled, pt verbalized understanding of appt date and time.

## 2022-10-13 ENCOUNTER — Other Ambulatory Visit (HOSPITAL_COMMUNITY): Payer: Self-pay

## 2022-10-15 ENCOUNTER — Other Ambulatory Visit: Payer: Self-pay

## 2022-10-15 ENCOUNTER — Encounter: Payer: Self-pay | Admitting: Rehabilitation

## 2022-10-15 ENCOUNTER — Ambulatory Visit: Payer: Medicare HMO | Attending: Hematology and Oncology | Admitting: Rehabilitation

## 2022-10-15 DIAGNOSIS — Z483 Aftercare following surgery for neoplasm: Secondary | ICD-10-CM | POA: Diagnosis not present

## 2022-10-15 DIAGNOSIS — Z170 Estrogen receptor positive status [ER+]: Secondary | ICD-10-CM | POA: Insufficient documentation

## 2022-10-15 DIAGNOSIS — C50411 Malignant neoplasm of upper-outer quadrant of right female breast: Secondary | ICD-10-CM | POA: Insufficient documentation

## 2022-10-15 DIAGNOSIS — M79601 Pain in right arm: Secondary | ICD-10-CM | POA: Insufficient documentation

## 2022-10-15 DIAGNOSIS — I890 Lymphedema, not elsewhere classified: Secondary | ICD-10-CM | POA: Insufficient documentation

## 2022-10-15 NOTE — Therapy (Signed)
OUTPATIENT PHYSICAL THERAPY ONCOLOGY EVALUATION  Patient Name: Michele Page MRN: [id number] DOB:28-Mar-1956, 66 y.o., female Today's Date: 10/15/2022  END OF SESSION:  PT End of Session - 10/15/22 1658     Visit Number 1    Number of Visits 3    Date for PT Re-Evaluation 11/26/22    Authorization Type [employer]    PT Start Time 1400    PT Stop Time 1455    PT Time Calculation (min) 55 min    Activity Tolerance Patient tolerated treatment well    Behavior During Therapy WFL for tasks assessed/performed             Past Medical History:  Diagnosis Date   Anemia    past hx many yrs ago    Arthritis    lt knee   Breast cancer ([place])    Breast cancer of upper-outer quadrant of left female breast ([place]) 04/02/2016   Chronic kidney disease    CKD   Colon polyps    COPD (chronic obstructive pulmonary disease) ([name])    GERD (gastroesophageal reflux disease)    years ago- not current    Hyperlipidemia    Hypertension    Multiple sclerosis ([place])    Personal history of chemotherapy    Personal history of radiation therapy    Stroke ([place])    2013, mild cognitive deficits   Vision abnormalities    Past Surgical History:  Procedure Laterality Date   BREAST BIOPSY Left 03/31/2016   BREAST BIOPSY Right 07/24/2021   BREAST LUMPECTOMY Left 04/17/2016   BREAST LUMPECTOMY Right 09/02/2021   BREAST LUMPECTOMY WITH RADIOACTIVE SEED AND SENTINEL LYMPH NODE BIOPSY Right 09/02/2021   Procedure: RIGHT BREAST LUMPECTOMY WITH RADIOACTIVE SEED AND SENTINEL LYMPH NODE BIOPSY;  Surgeon: Erroll Luna, MD;  Location: [place];  Service: General;  Laterality: Right;   COLONOSCOPY     HYSTEROSCOPY WITH D & C N/A 12/19/2015   Procedure: DILATATION AND CURETTAGE /HYSTEROSCOPY;  Surgeon: Linda Hedges, DO;  Location: [facility];  Service: Gynecology;  Laterality: N/A;   POLYPECTOMY     PORTACATH PLACEMENT Right 10/16/2021   Procedure: INSERTION PORT-A-CATH;  Surgeon: Erroll Luna,  MD;  Location: [place];  Service: General;  Laterality: Right;   RADIOACTIVE SEED GUIDED PARTIAL MASTECTOMY WITH AXILLARY SENTINEL LYMPH NODE BIOPSY Left 04/17/2016   Procedure: RADIOACTIVE SEED GUIDED PARTIAL MASTECTOMY WITH AXILLARY SENTINEL LYMPH NODE BIOPSY;  Surgeon: Erroll Luna, MD;  Location: [place];  Service: General;  Laterality: Left;  RADIOACTIVE SEED GUIDED PARTIAL MASTECTOMY WITH AXILLARY SENTINEL LYMPH NODE BIOPSY    TUBAL LIGATION     VAGINAL DELIVERY     x3   Patient Active Problem List   Diagnosis Date Noted   Genetic testing 03/05/2022   Port-A-Cath in place 11/06/2021   Malignant neoplasm of upper-outer quadrant of right breast in female, estrogen receptor positive ([place]) 08/04/2021   Class 2 severe obesity due to excess calories with serious comorbidity and body mass index (BMI) of 37.0 to 37.9 in adult [facility]) 11/18/2020   Neck pain on right side 06/01/2018   History of breast cancer 11/25/2017   Breast cancer of upper-outer quadrant of left female breast ([place]) 04/02/2016   Essential hypertension 01/10/2016   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 01/10/2016   Gastroesophageal reflux disease without esophagitis 01/10/2016   Hyperlipidemia 01/10/2016   CKD (chronic kidney disease) 01/10/2016   Multiple sclerosis ([place]) 03/20/2015  Accelerated hypertension 03/20/2015   Cerebral infarction ([place]) 03/20/2015   Hemiplegia following CVA (cerebrovascular accident) ([place]) 03/20/2015    PCP: [name], NP  REFERRING PROVIDER: Dr. Lindi Adie   REFERRING DIAG: Lymphedema   THERAPY DIAG:  Malignant neoplasm of upper-outer quadrant of right breast in female, estrogen receptor positive ([place])  Lymphedema, not elsewhere classified  Aftercare following surgery for neoplasm  ONSET DATE: 09/30/22  Rationale for Evaluation and Treatment: Rehabilitation  SUBJECTIVE:                                                                                                                                                                                            SUBJECTIVE STATEMENT: I noticed the swelling in the whole Rt arm about 2 weeks ago.  The upper arm is getting a bit sore.    PERTINENT HISTORY: -History of right lumpectomy 09/02/21 with 4/6 nodes positive with chemotherapy and radiation completed, Hx of Lt lumpectomy in 2017 with 4 negative nodes removed and radiation.  [facility]. DVT study is negative, does show subcutaneous edema edema on dorsal aspect of right hand and distal forearm.     PAIN:  Are you having pain? No  PRECAUTIONS: bil lymphedema risk   WEIGHT BEARING RESTRICTIONS: No  FALLS:  Has patient fallen in last 6 months? 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  HAND DOMINANCE: right   PRIOR LEVEL OF FUNCTION: Independent  PATIENT GOALS: what to do for the swelling    OBJECTIVE:  COGNITION: Overall cognitive status: Within functional limits for tasks assessed   PALPATION: Non pitting, slight puffiness back of band  OBSERVATIONS / OTHER ASSESSMENTS: puffy back of hand  POSTURE: rounded shoulders, forward head  UPPER EXTREMITY AROM/PROM:  A/PROM RIGHT   eval   Shoulder extension 30-tight  Shoulder flexion 135 - pain  Shoulder abduction 160 - pain  Shoulder internal rotation   Shoulder external rotation     (Blank rows = not tested)  A/PROM LEFT   eval  Shoulder extension 70  Shoulder flexion 155  Shoulder abduction 165  Shoulder internal rotation   Shoulder external rotation     (Blank rows = not tested)  CERVICAL AROM: All within normal limits:   LYMPHEDEMA ASSESSMENTS:   LANDMARK RIGHT  eval  At axilla 33  15 cm 32  10 cm proximal to olecranon process 30.4  Olecranon process 27.8  15 cm  27.5  10 cm proximal to ulnar styloid process 23.5  Just proximal to ulnar styloid process 18.5  Across hand at thumb web space 22  At base of 2nd digit 6.9  (Blank rows = not tested) 0.7 length 2075m  LANDMARK LEFT  eval  At axilla 33.5  15 cm 33  10 cm proximal to olecranon process 30.7  Olecranon process 26  15 cm  26.4  10 cm proximal to ulnar styloid process 23  Just proximal to ulnar styloid process 16.5  Across hand at thumb web space 22  At base of 2nd digit 6.4  (Blank rows = not tested)  1983.610m  11654mifference   TODAY'S TREATMENT:                                                                                                                                         DATE: 10/15/22 Discussed CDT options with patient who will not be able to bandage due to working at fooCarMaxDue to only minimal volume difference between sides we decided to order an exostrong sleeve and glove size medium black which was sent to [facility] as an alight order.  Pt was notified that her insurance should start covering garments in the new year.  Discussed night options which pt does not seem to need currently.  Demonstrated donning and discussed wearing only during the day.  Gave pt shoulder exercises from post op sheet as she also has limited AROM in the right shoulder  PATIENT EDUCATION:  Education details: per today's note Person educated: Patient Education method: ExpConsulting civil engineeremonstration, Verbal cues, and Handouts Education comprehension: verbalized understanding and returned demonstration  HOME EXERCISE PROGRAM: Post op shoulder  ASSESSMENT:  CLINICAL IMPRESSION: Patient is a 66 46o. female who was seen today for physical therapy evaluation and treatment for Rt UE lymphedema onset x 3 weeks and shoulder stiffness and pain after surgery and radiation. Pt demonstrates increased edema at the hand and wrist area but overall has a volume difference of 116m51mich is below the  lymphedema threshold although does not include the hand.  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  We ordered garments today via fax per above.  Pt also has very limited and painful shoulder AROM which she said she did not know was that bad.  Pt was given shoulder AAROM to start and she was agreeable to return to learn MLD.    OBJECTIVE IMPAIRMENTS: decreased activity tolerance, decreased knowledge of use of DME, decreased ROM, increased edema, and impaired UE functional use.   ACTIVITY LIMITATIONS: carrying, lifting, and reach over head  PARTICIPATION LIMITATIONS: meal prep, cleaning, and community activity  PERSONAL FACTORS: Age, Fitness, and 1-2 comorbidities: bil cancer status, radiation and chemo hx  are also affecting patient's functional outcome.   REHAB POTENTIAL: Good  CLINICAL DECISION MAKING: Evolving/moderate complexity  EVALUATION COMPLEXITY: Moderate  GOALS: Goals reviewed with patient?  Yes  SHORT TERM GOALS/LTGs: Target date: 11/26/22  Pt will obtain compression garments for the Rt UE Baseline: Goal status: INITIAL  2.  Pt will be ind with self MLD for the UE Baseline:  Goal status: INITIAL  3.  Pt will be ind with exercises for the Rt shoulder to improve reach and mobility Baseline:  Goal status: INITIAL   PLAN: PT FREQUENCY: 1x/week  PT DURATION: 6 weeks  PLANNED INTERVENTIONS: Therapeutic exercises, Patient/Family education, Self Care, Manual therapy, and Re-evaluation  PLAN FOR NEXT SESSION: get sleeve? Remeasure UE, teach Rt UE MLD - extra time Rt shoulder PROM/AAROM may be interested in pulleys - wants fewer visits.    Stark Bray, PT 10/15/2022, 5:03 PM

## 2022-10-21 ENCOUNTER — Inpatient Hospital Stay: Payer: Medicare HMO

## 2022-10-21 ENCOUNTER — Inpatient Hospital Stay: Payer: Medicare HMO | Attending: Hematology and Oncology | Admitting: Pharmacist

## 2022-10-21 ENCOUNTER — Other Ambulatory Visit (HOSPITAL_COMMUNITY): Payer: Self-pay

## 2022-10-21 VITALS — BP 129/65 | HR 83 | Temp 97.8°F | Resp 18 | Ht 62.0 in | Wt 201.0 lb

## 2022-10-21 DIAGNOSIS — C50411 Malignant neoplasm of upper-outer quadrant of right female breast: Secondary | ICD-10-CM

## 2022-10-21 DIAGNOSIS — Z170 Estrogen receptor positive status [ER+]: Secondary | ICD-10-CM | POA: Insufficient documentation

## 2022-10-21 LAB — CBC WITH DIFFERENTIAL (CANCER CENTER ONLY)
Abs Immature Granulocytes: 0 10*3/uL (ref 0.00–0.07)
Basophils Absolute: 0 10*3/uL (ref 0.0–0.1)
Basophils Relative: 2 %
Eosinophils Absolute: 0 10*3/uL (ref 0.0–0.5)
Eosinophils Relative: 1 %
HCT: 33.9 % — ABNORMAL LOW (ref 36.0–46.0)
Hemoglobin: 11.6 g/dL — ABNORMAL LOW (ref 12.0–15.0)
Immature Granulocytes: 0 %
Lymphocytes Relative: 35 %
Lymphs Abs: 0.7 10*3/uL (ref 0.7–4.0)
MCH: 32.4 pg (ref 26.0–34.0)
MCHC: 34.2 g/dL (ref 30.0–36.0)
MCV: 94.7 fL (ref 80.0–100.0)
Monocytes Absolute: 0.2 10*3/uL (ref 0.1–1.0)
Monocytes Relative: 8 %
Neutro Abs: 1.2 10*3/uL — ABNORMAL LOW (ref 1.7–7.7)
Neutrophils Relative %: 54 %
Platelet Count: 215 10*3/uL (ref 150–400)
RBC: 3.58 MIL/uL — ABNORMAL LOW (ref 3.87–5.11)
RDW: 13.5 % (ref 11.5–15.5)
WBC Count: 2.1 10*3/uL — ABNORMAL LOW (ref 4.0–10.5)
nRBC: 0 % (ref 0.0–0.2)

## 2022-10-21 LAB — CMP (CANCER CENTER ONLY)
ALT: 19 U/L (ref 0–44)
AST: 20 U/L (ref 15–41)
Albumin: 4.5 g/dL (ref 3.5–5.0)
Alkaline Phosphatase: 90 U/L (ref 38–126)
Anion gap: 5 (ref 5–15)
BUN: 18 mg/dL (ref 8–23)
CO2: 29 mmol/L (ref 22–32)
Calcium: 10.4 mg/dL — ABNORMAL HIGH (ref 8.9–10.3)
Chloride: 107 mmol/L (ref 98–111)
Creatinine: 1.7 mg/dL — ABNORMAL HIGH (ref 0.44–1.00)
GFR, Estimated: 33 mL/min — ABNORMAL LOW (ref >60)
Glucose, Bld: 114 mg/dL — ABNORMAL HIGH (ref 70–99)
Potassium: 4.2 mmol/L (ref 3.5–5.1)
Sodium: 141 mmol/L (ref 135–145)
Total Bilirubin: 0.4 mg/dL (ref 0.3–1.2)
Total Protein: 7.1 g/dL (ref 6.5–8.1)

## 2022-10-21 NOTE — Progress Notes (Signed)
[place]       Telephone: [phone]?Fax: [phone]   Oncology Clinical Pharmacist Practitioner Initial Assessment  Michele Page is a 66 y.o. female with a diagnosis of breast cancer. They were contacted today via in person visit.  Indication/Regimen Palbociclib [name]) is being used appropriately for treatment of breast cancer by Dr. Nicholas Lose.      Wt Readings from Last 1 Encounters:  10/21/22 201 lb (91.2 kg)    Estimated body surface area is 2 meters squared as calculated from the following:   Height as of this encounter: '5\' 2"'$  (1.575 m).   Weight as of this encounter: 201 lb (91.2 kg).  The dosing regimen is 100 mg by mouth daily on days 1 to 21 of a 28-day cycle. This is being given  in combination with letrozole . It is planned to continue until disease progression or unacceptable toxicity.  Michele Page was seen today by clinical pharmacy to establish care arrival cycle management after being referred by Dr. Lindi Adie.  She started palbociclib on 09/28/22 and states today that she is tolerating it very well.  Her only concern is that her blood pressure has been elevated since starting palbociclib.  While we told her that this is not a common side effect of the drug, every patient can respond differently.  She will be seen her nurse practitioner [name] this Friday who manages her blood pressure and cholesterol medications.  Michele Page does a great job with keeping a blood pressure log at home and she will use this to potentially adjust her blood pressure medications when seeing [name].  Today clinical pharmacy went over possible side effects of palbociclib which is limited to neutropenia, thrombocytopenia, anemia, pneumonitis, fatigue, hair loss, nausea, vomiting, and diarrhea.  We also went over the importance of drinking plenty of fluids.  Her ANC today is estimated at 1200 cells/L and we will continue to monitor this.  We did discuss the importance of  having a digital thermometer at home and went over the definition of fever that is given by the CDC.  Her hemoglobin has slightly dropped as well and her calcium and serum creatinine are all slightly elevated.  We will continue to monitor these values.  She does report having a runny nose as of late.  She is not having any other symptoms at this time.  Clinical pharmacy discussed that she could consider trying over-the-counter fexofenadine (Allegra).  She is having no problem obtaining palbociclib from the [place].  She has secured a grant for this medication.  We did go over how to take the medication and what to do with the missed dose if she does miss a dose of palbociclib.  She states that she takes this medication at 11 AM every morning after she gets home from work.  She has missed 1 dose since starting palbociclib 3 weeks ago.  She is due to start her next cycle this Monday for entheses 10/26/22).  Manufacturing guidelines recommend labs at the 2-week mark after this next cycle starts and so clinical pharmacy will see her again tentatively on 11/10/22.  We will then see her again on 11/23/22 and we will move Dr. Geralyn Flash visit from 11/30/22 to 12/07/22.  Michele Page did inquire about restaging scans and we explained that Dr. Lindi Adie usually will do restaging scans 3 months after starting palbociclib.  These would be due again around mid February.  He may order these at  their next visit together.  Dose Modifications Michele Page is taking a reduced dose of palbociclib which is 100 mg daily for 21 days followed by a 7-day rest period.  Access Assessment Michele Page will be receiving palbociclib through [facility] Insurance Concerns: None Start date if known: 09/28/22  Allergies No Known Allergies  Vitals    10/21/2022   10:31 AM 10/12/2022    1:45 PM 09/18/2022   11:47 AM  Oncology Vitals  Height 158 cm 158 cm 158 cm  Weight 91.173 kg 93.396 kg 92.851 kg   Weight (lbs) 201 lbs 205 lbs 14 oz 204 lbs 11 oz  BMI 36.76 kg/m2   36.76 kg/m2 37.66 kg/m2   37.66 kg/m2 37.44 kg/m2   37.44 kg/m2  Temp 97.8 F (36.6 C) 98.6 F (37 C) 97.3 F (36.3 C)  Pulse Rate 83 84 86  BP 129/65 164/76 141/74  Resp '18 18 18  '$ SpO2 99 % 97 % 98 %  BSA (m2) 2 m2   2 m2 2.02 m2   2.02 m2 2.02 m2   2.02 m2     Laboratory Data    Latest Ref Rng & Units 10/21/2022    9:39 AM 07/06/2022   11:52 AM 05/28/2022   12:03 PM  CBC EXTENDED  WBC 4.0 - 10.5 K/uL 2.1  5.2  3.3   RBC 3.87 - 5.11 MIL/uL 3.58  3.41  3.33   Hemoglobin 12.0 - 15.0 g/dL 11.6  11.2  11.0   HCT 36.0 - 46.0 % 33.9  31.6  31.0   Platelets 150 - 400 K/uL 215  246  267   NEUT# 1.7 - 7.7 K/uL 1.2  3.6  2.1   Lymph# 0.7 - 4.0 K/uL 0.7  0.9  0.7        Latest Ref Rng & Units 10/21/2022    9:39 AM 07/06/2022   11:52 AM 06/05/2022    1:29 PM  CMP  Glucose 70 - 99 mg/dL 114  130  136   BUN 8 - 23 mg/dL '18  20  23   '$ Creatinine 0.44 - 1.00 mg/dL 1.70  1.52  1.77   Sodium 135 - 145 mmol/L 141  140  141   Potassium 3.5 - 5.1 mmol/L 4.2  3.4  3.8   Chloride 98 - 111 mmol/L 107  106  106   CO2 22 - 32 mmol/L '29  28  24   '$ Calcium 8.9 - 10.3 mg/dL 10.4  9.7  9.8   Total Protein 6.5 - 8.1 g/dL 7.1  6.6    Total Bilirubin 0.3 - 1.2 mg/dL 0.4  0.4    Alkaline Phos 38 - 126 U/L 90  80    AST 15 - 41 U/L 20  23    ALT 0 - 44 U/L 19  19     No results found for: "MG" No results found for: "CA2729"   Contraindications Contraindications were reviewed?  Yes Contraindications to therapy were identified?  No  Safety Precautions The following safety precautions for the use of palbociclib were reviewed:  Fever: reviewed the importance of having a thermometer and the Centers for Disease Control and Prevention (CDC) definition of fever which is 100.10F (38C) or higher. Patient should call 24/7 triage at [phone] if experiencing a fever or any other symptoms Myelosuppression ILD /  Pneumonitis Fatigue Alopecia N/V/D Storage and handling Missed doses  Medication Reconciliation Current Outpatient Medications  Medication Sig Dispense Refill  aspirin EC 81 MG tablet Take 1 tablet (81 mg total) by mouth daily. 30 tablet 6   atorvastatin (LIPITOR) 20 MG tablet Take 1 tablet (20 mg total) by mouth daily. 90 tablet 1   eszopiclone (LUNESTA) 2 MG TABS tablet Take one tablet by mouth at bedtime as needed for sleep (take immediately before bedtime) 30 tablet 3   labetalol (NORMODYNE) 300 MG tablet TAKE 2 TABLETS THREE TIMES DAILY 540 tablet 2   letrozole (FEMARA) 2.5 MG tablet Take 1 tablet (2.5 mg total) by mouth daily. 90 tablet 3   NIFEdipine (PROCARDIA XL/NIFEDICAL XL) 60 MG 24 hr tablet Take 1 tablet (60 mg total) by mouth 2 (two) times daily. 180 tablet 3   palbociclib (IBRANCE) 100 MG tablet Take 1 tablet (100 mg total) by mouth daily. Take for 21 days on, 7 days off, repeat every 28 days. 21 tablet 10   No current facility-administered medications for this visit.    Medication reconciliation is based on the patient's most recent medication list in the electronic medical record (EMR) including herbal products and OTC medications.   The patient's medication list was reviewed today with the patient?  Yes  Drug-drug interactions (DDIs) DDIs were evaluated?  Yes Significant DDIs identified?  No  Drug-Food Interactions Drug-food interactions were evaluated?  Yes Drug-food interactions identified?  Yes, Michele Page.  Will abstain from grapefruit products  Follow-up Plan  Continue palbociclib 100 mg by mouth daily for 21 days, followed by a 7-day rest period.  He is currently in her off week and will start her next cycle this Monday (10/26/22) Continue letrozole 2.5 mg by mouth daily Monitor ANC, hemoglobin, calcium, and serum creatinine Michele Page will follow-up with nurse practitioner [name] for management of blood pressure and cholesterol Labs, pharmacy  clinic visit will be ordered for 11/10/22 and 11/23/22 To coincide with Michele Page palbociclib cycle and manufacturing guidelines for monitoring, we will move Dr. Geralyn Flash visit with labs to 12/07/22  Michele Page participated in the discussion, expressed understanding, and voiced agreement with the above plan. All questions were answered to her satisfaction. The patient was advised to contact the clinic at [phone] with any questions or concerns prior to her return visit.   I spent 60 minutes assessing the patient.  Raina Mina, RPH-CPP, 10/21/2022 11:30 AM  **Disclaimer: This note was dictated with voice recognition software. Similar sounding words can inadvertently be transcribed and this note may contain transcription errors which may not have been corrected upon publication of note.**

## 2022-10-22 ENCOUNTER — Other Ambulatory Visit (HOSPITAL_COMMUNITY): Payer: Self-pay

## 2022-10-23 ENCOUNTER — Ambulatory Visit (INDEPENDENT_AMBULATORY_CARE_PROVIDER_SITE_OTHER): Payer: Medicare HMO | Admitting: Nurse Practitioner

## 2022-10-23 ENCOUNTER — Encounter: Payer: Self-pay | Admitting: Nurse Practitioner

## 2022-10-23 VITALS — BP 122/68 | HR 77 | Temp 98.6°F | Ht 62.0 in | Wt 200.6 lb

## 2022-10-23 DIAGNOSIS — F5101 Primary insomnia: Secondary | ICD-10-CM | POA: Diagnosis not present

## 2022-10-23 DIAGNOSIS — I10 Essential (primary) hypertension: Secondary | ICD-10-CM | POA: Diagnosis not present

## 2022-10-23 DIAGNOSIS — R519 Headache, unspecified: Secondary | ICD-10-CM | POA: Diagnosis not present

## 2022-10-23 DIAGNOSIS — G35 Multiple sclerosis: Secondary | ICD-10-CM

## 2022-10-23 DIAGNOSIS — N1832 Chronic kidney disease, stage 3b: Secondary | ICD-10-CM

## 2022-10-23 NOTE — Progress Notes (Signed)
Careteam: Patient Care Team: [name], NP as PCP - General (Geriatric Medicine) Lorretta Harp, MD as PCP - Cardiology (Cardiology) Nicholas Lose, MD as Consulting Physician (Oncology) Erroll Luna, MD as Consulting Physician (General Surgery)  PLACE OF SERVICE:  [facility] Directive information    No Known Allergies  Chief Complaint  Patient presents with   Referral   Headache    She states that it is better because the headaches.      HPI: Patient is a 66 y.o. female due to severe headaches.  She reports she had a week of bad headaches- this was about 3 weeks ago. Throbbing heading when she turned her head a certain ways.  No vision changes or weakness.  Previously was following with neurologist for MS  Did not want to take anything for the headache and it went away Headache was positional.   Htn- bp controlled  She wasn't drinking enough water but has improved now.   Continues to follow up with oncology due to breast cancer  She has lymphedema in her right arm- following with lymphedema clinic Review of Systems:  Review of Systems  Constitutional:  Negative for chills, fever and weight loss.  HENT:  Negative for tinnitus.   Respiratory:  Negative for cough, sputum production and shortness of breath.   Cardiovascular:  Negative for chest pain, palpitations and leg swelling.  Gastrointestinal:  Negative for abdominal pain, constipation, diarrhea and heartburn.  Genitourinary:  Negative for dysuria, frequency and urgency.  Musculoskeletal:  Negative for back pain, falls, joint pain and myalgias.  Skin: Negative.   Neurological:  Positive for headaches. Negative for dizziness.  Psychiatric/Behavioral:  Negative for depression and memory loss. The patient does not have insomnia.     Past Medical History:  Diagnosis Date   Anemia    past hx many yrs ago    Arthritis    lt knee   Breast cancer ([place])    Breast cancer of upper-outer  quadrant of left female breast ([place]) 04/02/2016   Chronic kidney disease    CKD   Colon polyps    COPD (chronic obstructive pulmonary disease) ([name])    GERD (gastroesophageal reflux disease)    years ago- not current    Hyperlipidemia    Hypertension    Multiple sclerosis ([place])    Personal history of chemotherapy    Personal history of radiation therapy    Stroke ([place])    2013, mild cognitive deficits   Vision abnormalities    Past Surgical History:  Procedure Laterality Date   BREAST BIOPSY Left 03/31/2016   BREAST BIOPSY Right 07/24/2021   BREAST LUMPECTOMY Left 04/17/2016   BREAST LUMPECTOMY Right 09/02/2021   BREAST LUMPECTOMY WITH RADIOACTIVE SEED AND SENTINEL LYMPH NODE BIOPSY Right 09/02/2021   Procedure: RIGHT BREAST LUMPECTOMY WITH RADIOACTIVE SEED AND SENTINEL LYMPH NODE BIOPSY;  Surgeon: Erroll Luna, MD;  Location: [facility];  Service: General;  Laterality: Right;   COLONOSCOPY     HYSTEROSCOPY WITH D & C N/A 12/19/2015   Procedure: DILATATION AND CURETTAGE /HYSTEROSCOPY;  Surgeon: Linda Hedges, DO;  Location: [facility];  Service: Gynecology;  Laterality: N/A;   POLYPECTOMY     PORTACATH PLACEMENT Right 10/16/2021   Procedure: INSERTION PORT-A-CATH;  Surgeon: Erroll Luna, MD;  Location: [facility];  Service: General;  Laterality: Right;   RADIOACTIVE SEED GUIDED PARTIAL MASTECTOMY WITH AXILLARY SENTINEL LYMPH NODE BIOPSY Left 04/17/2016   Procedure: RADIOACTIVE  SEED GUIDED PARTIAL MASTECTOMY WITH AXILLARY SENTINEL LYMPH NODE BIOPSY;  Surgeon: Erroll Luna, MD;  Location: [facility];  Service: General;  Laterality: Left;  RADIOACTIVE SEED GUIDED PARTIAL MASTECTOMY WITH AXILLARY SENTINEL LYMPH NODE BIOPSY    TUBAL LIGATION     VAGINAL DELIVERY     x3   Social History:   reports that she quit smoking about 17 years ago. Her smoking use included cigarettes. She has a 12.50 pack-year smoking history. She has never used  smokeless tobacco. She reports that she does not currently use alcohol. She reports that she does not use drugs.  Family History  Problem Relation Age of Onset   Hypertension Mother    Stroke Mother    Stroke Sister    Alcohol abuse Brother    HIV/AIDS Brother    Breast cancer Cousin        dx 30s   Colon cancer Neg Hx    Colon polyps Neg Hx    Esophageal cancer Neg Hx    Rectal cancer Neg Hx    Stomach cancer Neg Hx     Medications: Patient's Medications  New Prescriptions   No medications on file  Previous Medications   ASPIRIN EC 81 MG TABLET    Take 1 tablet (81 mg total) by mouth daily.   ATORVASTATIN (LIPITOR) 20 MG TABLET    Take 1 tablet (20 mg total) by mouth daily.   ESZOPICLONE (LUNESTA) 2 MG TABS TABLET    Take one tablet by mouth at bedtime as needed for sleep (take immediately before bedtime)   LABETALOL (NORMODYNE) 300 MG TABLET    TAKE 2 TABLETS THREE TIMES DAILY   LETROZOLE (FEMARA) 2.5 MG TABLET    Take 1 tablet (2.5 mg total) by mouth daily.   NIFEDIPINE (PROCARDIA XL/NIFEDICAL XL) 60 MG 24 HR TABLET    Take 1 tablet (60 mg total) by mouth 2 (two) times daily.   PALBOCICLIB (IBRANCE) 100 MG TABLET    Take 1 tablet (100 mg total) by mouth daily. Take for 21 days on, 7 days off, repeat every 28 days.  Modified Medications   No medications on file  Discontinued Medications   No medications on file    Physical Exam:  Vitals:   10/23/22 1258  BP: 122/68  Pulse: 77  Temp: 98.6 F (37 C)  SpO2: 98%  Weight: 200 lb 9.6 oz (91 kg)  Height: '5\' 2"'$  (1.575 m)   Body mass index is 36.69 kg/m. Wt Readings from Last 3 Encounters:  10/23/22 200 lb 9.6 oz (91 kg)  10/21/22 201 lb (91.2 kg)  10/12/22 205 lb 14.4 oz (93.4 kg)    Physical Exam Constitutional:      General: She is not in acute distress.    Appearance: She is well-developed. She is not diaphoretic.  HENT:     Head: Normocephalic and atraumatic.     Mouth/Throat:     Pharynx: No oropharyngeal  exudate.  Eyes:     Conjunctiva/sclera: Conjunctivae normal.     Pupils: Pupils are equal, round, and reactive to light.  Cardiovascular:     Rate and Rhythm: Normal rate and regular rhythm.     Heart sounds: Normal heart sounds.  Pulmonary:     Effort: Pulmonary effort is normal.     Breath sounds: Normal breath sounds.  Abdominal:     General: Bowel sounds are normal.     Palpations: Abdomen is soft.  Musculoskeletal:  Cervical back: Normal range of motion and neck supple.     Right lower leg: No edema.     Left lower leg: No edema.  Skin:    General: Skin is warm and dry.  Neurological:     Mental Status: She is alert.  Psychiatric:        Mood and Affect: Mood normal.     Labs reviewed: Basic Metabolic Panel: Recent Labs    06/05/22 1329 07/06/22 1152 10/21/22 0939  NA 141 140 141  K 3.8 3.4* 4.2  CL 106 106 107  CO2 '24 28 29  '$ GLUCOSE 136 130* 114*  BUN '23 20 18  '$ CREATININE 1.77* 1.52* 1.70*  CALCIUM 9.8 9.7 10.4*   Liver Function Tests: Recent Labs    05/28/22 1203 07/06/22 1152 10/21/22 0939  AST '22 23 20  '$ ALT '15 19 19  '$ ALKPHOS 92 80 90  BILITOT 0.4 0.4 0.4  PROT 7.1 6.6 7.1  ALBUMIN 4.5 4.3 4.5   No results for input(s): "LIPASE", "AMYLASE" in the last 8760 hours. No results for input(s): "AMMONIA" in the last 8760 hours. CBC: Recent Labs    05/28/22 1203 07/06/22 1152 10/21/22 0939  WBC 3.3* 5.2 2.1*  NEUTROABS 2.1 3.6 1.2*  HGB 11.0* 11.2* 11.6*  HCT 31.0* 31.6* 33.9*  MCV 93.1 92.7 94.7  PLT 267 246 215   Lipid Panel: Recent Labs    06/05/22 1329  CHOL 161  HDL 69  LDLCALC 78  TRIG 68  CHOLHDL 2.3   TSH: No results for input(s): "TSH" in the last 8760 hours. A1C: Lab Results  Component Value Date   HGBA1C 5.8 (H) 05/13/2020     Assessment/Plan 1. Multiple sclerosis ([place]) - Ambulatory referral to Neurology  2. Nonintractable headache, unspecified chronicity pattern, unspecified headache type -new headache but  has improved, due to hx of MS would like referral to neurology - Ambulatory referral to Neurology  3. Stage 3b chronic kidney disease (CKD) ([name]) -Chronic and stable Encourage proper hydration Follow metabolic panel Avoid nephrotoxic meds (NSAIDS)  4. Essential hypertension -Blood pressure well controlled, goal bp <140/90 Continue current medications and dietary modifications follow metabolic panel  5. Morbid obesity ([place]) -education provided on healthy weight loss through increase in physical activity and proper nutrition   6. Primary insomnia -well controlled on lunesta    Return in about 6 months (around 04/24/2023) for routine follow up . Carlos American. [place], [facility] [phone]

## 2022-11-02 ENCOUNTER — Ambulatory Visit: Payer: Medicare HMO | Admitting: Nurse Practitioner

## 2022-11-02 ENCOUNTER — Encounter: Payer: Self-pay | Admitting: Rehabilitation

## 2022-11-02 ENCOUNTER — Ambulatory Visit: Payer: Medicare HMO | Attending: Hematology and Oncology | Admitting: Rehabilitation

## 2022-11-02 DIAGNOSIS — Z170 Estrogen receptor positive status [ER+]: Secondary | ICD-10-CM | POA: Insufficient documentation

## 2022-11-02 DIAGNOSIS — I890 Lymphedema, not elsewhere classified: Secondary | ICD-10-CM | POA: Insufficient documentation

## 2022-11-02 DIAGNOSIS — C50411 Malignant neoplasm of upper-outer quadrant of right female breast: Secondary | ICD-10-CM | POA: Diagnosis not present

## 2022-11-02 DIAGNOSIS — Z483 Aftercare following surgery for neoplasm: Secondary | ICD-10-CM | POA: Diagnosis not present

## 2022-11-02 NOTE — Patient Instructions (Signed)
PLEASE KEEP YOUR BANDAGES ON AS LONG AS POSSIBLE TO GET THE BEST SWELLING REDUCTION. Should your bandages become uncomfortable or feel too tight, follow these steps: Elevate your extremity higher than your heart.  Try to move your arm or leg joints against the firmness of the bandage to help with moving the fluid and allow the bandages to loosen a bit.  If the bandaging is still is too tight, it is ok to carefully remove the top layer.  There will still be more layers under it that can provide compression to your extremity. Finally, if you STILL have significant pain after trying these steps, it is ok to take the bandage off.  Check your skin carefully for any signs of irritation  PLEASE bring ALL bandage materials back to your next appointment as we will reuse what we can TAKE CARE OF YOUR BANDAGES SO THEY WILL LAST LONGER AND STAY IN BETTER CONDITION Washing bandages:  Wash periodically using a mild detergent in warm water.  Do not use fabric softener or bleach.  Place bandages in a mesh lingerie bag or in a tied off pillow case and use the gentle cycle of the washing machine or hand wash. If you hand wash, you may want to put them in the spin cycle of your washer to get the extra water out, but make sure you put them in a mesh bag first. Do not wring or stretch them while they are wet.  Drying bandages: Lay the bandages out smoothly on a towel away from direct sunlight or heating sources that can damage the fabric. Rolling bandages in a towel and gently squeezing the towel to remove excess water before laying them out can speed up the process.  If you use a drying rack, place a towel on top of the rack to lay the bandages on.  If they hang down to dry, they fabric could be stretched out and the bandage will lose its compression.   Or, keep bandages in the mesh bag and dry them in the dryer on the low or no heat cycle. Rolling bandages: Please roll your bandages after drying them so they are ready for  your next treatment. If they are rolled too loose, they will be difficult to apply.  If rolled too tight, they can get stretched out.   TAKE CARE OF YOUR SKIN Apply a low pH moisturizing lotion to your skin daily Avoid scratching your skin Treat skin irritations quickly  Know the 5 warning signs of infection: redness, pain, warmth to touch, fever and increased swelling.  Call your physician immediately if you notice any of these signs of a possible infection.   Self bandaging the arm:   Follow along with this video:  Https://www.youtube.com/watch?v=[email or web address]  -OR-  Search in your internet browser: "self bandaging arm MD [facility]"   And the video should appear in the video search results "Lymphedema Management: Self bandaging your arm" on YouTube   This video is for caregiver assistance:  https://www.youtube.com/watch?v=[email or web address]-JVdRIs  -OR-  Enter " Lymphedema Management: Caregiver Bandaging you arm" into your search browser and your video should appear in the results   

## 2022-11-02 NOTE — Therapy (Signed)
OUTPATIENT PHYSICAL THERAPY ONCOLOGY TREATMENT  Patient Name: Michele Page MRN: [id number] DOB:December 27, 1955, 66 y.o., female Today's Date: 11/02/2022  END OF SESSION:  PT End of Session - 11/02/22 1256     Visit Number 2    Number of Visits 3    Date for PT Re-Evaluation 11/26/22    Authorization Type 6 visits until 11/26/22    Authorization - Visit Number 2    Authorization - Number of Visits 6    PT Start Time 1300    Activity Tolerance Patient tolerated treatment well    Behavior During Therapy [facility] for tasks assessed/performed             Past Medical History:  Diagnosis Date   Anemia    past hx many yrs ago    Arthritis    lt knee   Breast cancer ([place])    Breast cancer of upper-outer quadrant of left female breast ([place]) 04/02/2016   Chronic kidney disease    CKD   Colon polyps    COPD (chronic obstructive pulmonary disease) ([name])    GERD (gastroesophageal reflux disease)    years ago- not current    Hyperlipidemia    Hypertension    Multiple sclerosis ([place])    Personal history of chemotherapy    Personal history of radiation therapy    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, mild cognitive deficits   Vision abnormalities    Past Surgical History:  Procedure Laterality Date   BREAST BIOPSY Left 03/31/2016   BREAST BIOPSY Right 07/24/2021   BREAST LUMPECTOMY Left 04/17/2016   BREAST LUMPECTOMY Right 09/02/2021   BREAST LUMPECTOMY WITH RADIOACTIVE SEED AND SENTINEL LYMPH NODE BIOPSY Right 09/02/2021   Procedure: RIGHT BREAST LUMPECTOMY WITH RADIOACTIVE SEED AND SENTINEL LYMPH NODE BIOPSY;  Surgeon: Erroll Luna, MD;  Location: [facility];  Service: General;  Laterality: Right;   COLONOSCOPY     HYSTEROSCOPY WITH D & C N/A 12/19/2015   Procedure: DILATATION AND CURETTAGE /HYSTEROSCOPY;  Surgeon: Linda Hedges, DO;  Location: [facility];  Service: Gynecology;  Laterality: N/A;   POLYPECTOMY     PORTACATH PLACEMENT Right 10/16/2021   Procedure: INSERTION  PORT-A-CATH;  Surgeon: Erroll Luna, MD;  Location: [facility];  Service: General;  Laterality: Right;   RADIOACTIVE SEED GUIDED PARTIAL MASTECTOMY WITH AXILLARY SENTINEL LYMPH NODE BIOPSY Left 04/17/2016   Procedure: RADIOACTIVE SEED GUIDED PARTIAL MASTECTOMY WITH AXILLARY SENTINEL LYMPH NODE BIOPSY;  Surgeon: Erroll Luna, MD;  Location: [facility];  Service: General;  Laterality: Left;  RADIOACTIVE SEED GUIDED PARTIAL MASTECTOMY WITH AXILLARY SENTINEL LYMPH NODE BIOPSY    TUBAL LIGATION     VAGINAL DELIVERY     x3   Patient Active Problem List   Diagnosis Date Noted   Genetic testing 03/05/2022   Port-A-Cath in place 11/06/2021   Malignant neoplasm of upper-outer quadrant of right breast in female, estrogen receptor positive ([name]) 08/04/2021   Class 2 severe obesity due to excess calories with serious comorbidity and body mass index (BMI) of 37.0 to 37.9 in adult [name]) 11/18/2020   Neck pain on right side 06/01/2018   History of breast cancer 11/25/2017   Breast cancer of upper-outer quadrant of left female breast ([name]) 04/02/2016   Essential hypertension 01/10/2016   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 01/10/2016   Gastroesophageal reflux disease without esophagitis 01/10/2016   Hyperlipidemia 01/10/2016   CKD (chronic kidney disease) 01/10/2016   Multiple sclerosis (  [place]) 03/20/2015   Accelerated hypertension 03/20/2015   Cerebral infarction ([name]) 03/20/2015   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 Lindi Adie   REFERRING DIAG: Lymphedema   THERAPY DIAG:  Malignant neoplasm of upper-outer quadrant of right breast in female, estrogen receptor positive [facility])  Aftercare following surgery for neoplasm  Lymphedema, not elsewhere classified  ONSET DATE: 09/30/22  Rationale for Evaluation and Treatment: Rehabilitation  SUBJECTIVE:                                                                                                                                                                                            SUBJECTIVE STATEMENT:  I got the sleeve.  I think they are too small.  My hand seems to be getting larger  PERTINENT HISTORY: -History of right lumpectomy 09/02/21 with 4/6 nodes positive with chemotherapy and radiation completed, Hx of Lt lumpectomy in 2017 with 4 negative nodes removed and radiation.  Recently started [facility] 09/28/22. DVT study is negative, does show subcutaneous edema edema on dorsal aspect of right hand and distal forearm.     PAIN:  Are you having pain? No  PRECAUTIONS: bil lymphedema risk   WEIGHT BEARING RESTRICTIONS: No  FALLS:  Has patient fallen in last 6 months? 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  HAND DOMINANCE: right   PRIOR LEVEL OF FUNCTION: Independent  PATIENT GOALS: what to do for the swelling    OBJECTIVE:  COGNITION: Overall cognitive status: Within functional limits for tasks assessed   PALPATION: Non pitting, slight puffiness back of band  OBSERVATIONS / OTHER ASSESSMENTS: puffy back of hand  POSTURE: rounded shoulders, forward head  UPPER EXTREMITY AROM/PROM:  A/PROM RIGHT   eval   Shoulder extension 30-tight  Shoulder flexion 135 - pain  Shoulder abduction 160 - pain  Shoulder internal rotation   Shoulder external rotation     (Blank rows = not tested)  A/PROM LEFT   eval  Shoulder extension 70  Shoulder flexion 155  Shoulder abduction 165  Shoulder internal rotation   Shoulder external rotation     (Blank rows = not tested)  CERVICAL AROM: All within normal limits:   LYMPHEDEMA ASSESSMENTS:   LANDMARK RIGHT  eval  At axilla 33  15 cm 32  10 cm proximal to olecranon process 30.4  Olecranon  process 27.8  15 cm 27.5  10 cm proximal to ulnar styloid process 23.5  Just proximal to ulnar styloid process 18.5  Across hand at thumb web space 22  At base of 2nd digit 6.9  (Blank rows = not tested) 0.7 length 2014m  LANDMARK LEFT  eval 11/02/22  At axilla 33.5 33.5  15 cm 33 33  10 cm proximal to olecranon process 30.7 30.5  Olecranon process '26 28  15 '$ cm  26.4 27.2  10 cm proximal to ulnar styloid process 23 22.8  Just proximal to ulnar styloid process 16.5 17.2  Across hand at thumb web space 22 22  At base of 2nd digit 6.4 7.0  (Blank rows = not tested)  1983.635m  1164mifference   TODAY'S TREATMENT:                                                                                                                                         DATE:  11/02/22: Remeasured UE which demonstrates some increase in the lower arm and hand with soft pitting at the dorsum of the hand. Pt received her sleeve and glove.  The sleeve seems to fit well but the glove is too small and is the wrong color.  Remeasured hand and will move up to size large and change of colors.  Pt will also try a half finger as the full finger is over her nail bed.  Emailed [facility]Kathlee Nationsd asked for rush reorder.   Discussed options again and demonstrated bandaging for pt in clinic.  She decided that it was not as bad as she thought it would be but is still not able to come in very often due to work schedule.  She thinks her spouse would be willing to help.  Applied the following: tg soft medium inside out, fingers 1-5, artiflex from hand to axilla, 1 6cm hand bandage, 1 8cm and 1 10cm from wrist to axilla.  With vcs and instruction throughout.  G[facility] pt [facility] for "caregiver bandaging your arm".  Pt was instructed to wear this for up to 48 hours and then remove and have husband practice as able.  Pt was instructed on bandage care, when to remove, etc. Pt will bandage as much as possible and then switch  over to her new glove as able with bandaging over night if needed.    10/15/22 Discussed CDT options with patient who will not be able to bandage due to working at [employer].  Due to only minimal volume difference between sides we decided to order an exostrong sleeve and glove size medium black which was sent to [facility] as an alight order.  Pt was notified that her insurance should start covering garments in the new year.  Discussed night options which pt does not seem to need currently.  Demonstrated donning and discussed wearing only during the day.  Gave pt shoulder exercises from post op sheet as she also has  limited AROM in the right shoulder  PATIENT EDUCATION:  Education details: per today's note Person educated: Patient Education method: Consulting civil engineer, Demonstration, Verbal cues, and Handouts Education comprehension: verbalized understanding and returned demonstration  HOME EXERCISE PROGRAM: Post op shoulder  ASSESSMENT:  CLINICAL IMPRESSION:Due to continued increase in hand edema and glove not yet fitting, pt was agreeable to learning bandaging for the UE with the hope that her spouse will be able to assist.  She will hopefully get her new glove quickly which should be able to take the place of the bandaging.  Pt will return next week to review status, new garments, review bandaging for night/as needed, and education on MLD.    OBJECTIVE IMPAIRMENTS: decreased activity tolerance, decreased knowledge of use of DME, decreased ROM, increased edema, and impaired UE functional use.   ACTIVITY LIMITATIONS: carrying, lifting, and reach over head  PARTICIPATION LIMITATIONS: meal prep, cleaning, and community activity  PERSONAL FACTORS: Age, Fitness, and 1-2 comorbidities: bil cancer status, radiation and chemo hx  are also affecting patient's functional outcome.   REHAB POTENTIAL: Good  CLINICAL DECISION MAKING: Evolving/moderate complexity  EVALUATION COMPLEXITY:  Moderate  GOALS: Goals reviewed with patient? Yes  SHORT TERM GOALS/LTGs: Target date: 11/26/22  Pt will obtain compression garments for the Rt UE Baseline: Goal status: INITIAL  2.  Pt will be ind with self MLD for the UE Baseline:  Goal status: INITIAL  3.  Pt will be ind with exercises for the Rt shoulder to improve reach and mobility Baseline:  Goal status: INITIAL   PLAN: PT FREQUENCY: 1x/week  PT DURATION: 6 weeks  PLANNED INTERVENTIONS: Therapeutic exercises, Patient/Family education, Self Care, Manual therapy, and Re-evaluation  PLAN FOR NEXT SESSION: get new glove? Will new combo work? Remeasure UE, teach Rt UE MLD and review bandaging as needed. Any extra time Rt shoulder PROM/AAROM may be interested in pulleys - wants fewer visits.    Stark Bray, PT 11/02/2022, 12:57 PM

## 2022-11-03 ENCOUNTER — Encounter: Payer: Self-pay | Admitting: Hematology and Oncology

## 2022-11-03 NOTE — Progress Notes (Signed)
Referral received from staff to contact patient regarding PAF paperwork received.  Called patient to introduce myself and to assist with concern. Advised patient the PAF grant she received was applied by pharmacy to keep her [name] cost at [employer]. Advised if she wanted to seek reimbursement she would have to provide documentation from billing to PAF for these costs. Advised this is just one grant and will be used til Nov 2024 for [place] cost and any additional expenses submitted would e deducted from this same grant. Advised if there is any available copay assistance, [employer] with [facility] would apply on there behalf at [phone]. Called and left the number to [employer] on her voicemail per her request since she was driving as well as the number to billing for billing questions and reimbursement at [phone] option for hospital billing.  Left my number on her voicemail as well at [phone] for any additional financial questions or concerns.

## 2022-11-04 NOTE — Progress Notes (Unsigned)
[facility]  PATIENT: Michele Page DOB: 1956/08/14  REFERRING DOCTOR OR PCP:  Gildardo Cranker SOURCE: patient  _________________________________   HISTORICAL  CHIEF COMPLAINT:  No chief complaint on file.   HISTORY OF PRESENT ILLNESS:  Michele Page is a 66 y.o. woman with MS and h/o CVA.        She feels she is doing well.  She has never been on a DMT.   No new MS symptoms.   However, her gait is a little worse and she is stumbling more.  He right side is a little slower and clumsy compared to her left. She notes right foot drop, worse with long distances.  No dysesthesia or numbness.   Bladder is about the same --- has frequency and urgency but some urge incontinence   She had a      Gait/strength/sensation: Since the stroke there is some right sided weakness and very mild numbness and clumsiness.   This has led to a clumsy gait.   She walks without a cane and the right foot sometimes drags a bit She notes mild weakness in the right leg. She denies any numbness in either leg. She denies any actual weakness in the right arm but notes that since his stroke that her handwriting has been poor.  Bladder/Bowel:   She denies any significant bowel or bladder issue.  She has 1-2 x nocturia at night.  Vision: She denies any significant problems with her vision. She wears glasses for correction but notes no MS related vision problem. There is no diplopia.   Fatigue/Sleep:   She feels her fatigue is minimal and she notes no major difficulty with heat. She falls asleep easily.  She has 1-2 nocturia and quickly falls back asleep.   She snores but denies any excessive daytime sleepiness. Her husband reportedly has not noted pauses or gasping at night.  Mood/Cognition:   She denies anxiety or depression.      She has major cognitive difficulty.  She has a lot of difficulty with memory and often needs hints/cues.   She feels LTM is better.   She notes some difficulty coming up  with the right words.   She also feels processing speed is reduced.  She has also been treated for breast cancer with lumpectomy (03/2016) and radiation and appears to be in remission.   Patient is poor historian.   MS History:   About 2008, she had the onset of left sided numbness. The symptoms came on fairly quickly. They resolved over 2-3 weeks. She had an MRI at the time that showed lesions consistent with MS. She thinks she may have had a lumbar puncture as well. Her doctor at the time advised that she start medications for MS. However, all of the options were self injectable at that time and she preferred not to begin any of the therapies. She feels that she has done fairly well since that time and denies any new exacerbations. A few months ago, she moved down to [place] from [place].  Stroke History:  She has elevated blood pressure and had around 2011. She had the sudden onset of right arm and leg weakness. Additionally, she was having a lot of speech difficulties initially. Over time, the weakness improved and her speech improved, though she does not feel that they got back to baseline. She notes difficulty with handwriting and other skilled tasks with the right hand, even though she feels her strength is fine.Marland Kitchen  MRI was performed in the hospital at that time (she does not remember where). Her symptoms and imaging studies were felt to be more consistent with stroke than to an MS exacerbation. She was placed on aspirin and continues to take 1 baby aspirin daily.  Imaging: MRI of the brain from 02/27/2017. There is a remote hemorrhage involving the left thalamus and adjacent internal capsule little bit of the basal ganglia. Some wallerian degeneration is noted. She also has extensive T2/FLAIR hyperintense foci in the hemispheres some radially oriented in the ventricles and some of the juxtacortical white matter. This likely represents a combination of demyelination and chronic microvascular  ischemic change.  There were no acute findings.  MRI of the brain 12/14/2018 was unchanged compared to 02/27/2017.   REVIEW OF SYSTEMS: Constitutional: No fevers, chills, sweats, or change in appetite Eyes: No visual changes, double vision, eye pain Ear, nose and throat: No hearing loss, ear pain, nasal congestion, sore throat Cardiovascular: No chest pain, palpitations Respiratory:  No shortness of breath at rest or with exertion.   No wheezes GastrointestinaI: No nausea, vomiting, diarrhea, abdominal pain, fecal incontinence Genitourinary:  No dysuria, urinary retention or frequency.  She has nocturia. She has mildly elevated creatinine  Musculoskeletal:  No neck pain, back pain Integumentary: No rash, pruritus, skin lesions Neurological: as above Psychiatric: No depression at this time.  No anxiety.  She notes some memory loss. Endocrine: No palpitations, diaphoresis, change in appetite, change in weigh or increased thirst Hematologic/Lymphatic:  No anemia, purpura, petechiae. Allergic/Immunologic: No itchy/runny eyes, nasal congestion, recent allergic reactions, rashes  ALLERGIES: No Known Allergies  HOME MEDICATIONS:  Current Outpatient Medications:    aspirin EC 81 MG tablet, Take 1 tablet (81 mg total) by mouth daily., Disp: 30 tablet, Rfl: 6   atorvastatin (LIPITOR) 20 MG tablet, Take 1 tablet (20 mg total) by mouth daily., Disp: 90 tablet, Rfl: 1   eszopiclone (LUNESTA) 2 MG TABS tablet, Take one tablet by mouth at bedtime as needed for sleep (take immediately before bedtime), Disp: 30 tablet, Rfl: 3   labetalol (NORMODYNE) 300 MG tablet, TAKE 2 TABLETS THREE TIMES DAILY, Disp: 540 tablet, Rfl: 2   letrozole (FEMARA) 2.5 MG tablet, Take 1 tablet (2.5 mg total) by mouth daily., Disp: 90 tablet, Rfl: 3   NIFEdipine (PROCARDIA XL/NIFEDICAL XL) 60 MG 24 hr tablet, Take 1 tablet (60 mg total) by mouth 2 (two) times daily., Disp: 180 tablet, Rfl: 3   palbociclib (IBRANCE) 100 MG  tablet, Take 1 tablet (100 mg total) by mouth daily. Take for 21 days on, 7 days off, repeat every 28 days., Disp: 21 tablet, Rfl: 10  PAST MEDICAL HISTORY: Past Medical History:  Diagnosis Date   Anemia    past hx many yrs ago    Arthritis    lt knee   Breast cancer ([facility])    Breast cancer of upper-outer quadrant of left female breast ([facility]) 04/02/2016   Chronic kidney disease    CKD   Colon polyps    COPD (chronic obstructive pulmonary disease) ([name])    GERD (gastroesophageal reflux disease)    years ago- not current    Hyperlipidemia    Hypertension    Multiple sclerosis ([facility])    Personal history of chemotherapy    Personal history of radiation therapy    Stroke ([facility])    2013, mild cognitive deficits   Vision abnormalities     PAST SURGICAL HISTORY: Past Surgical History:  Procedure Laterality Date  BREAST BIOPSY Left 03/31/2016   BREAST BIOPSY Right 07/24/2021   BREAST LUMPECTOMY Left 04/17/2016   BREAST LUMPECTOMY Right 09/02/2021   BREAST LUMPECTOMY WITH RADIOACTIVE SEED AND SENTINEL LYMPH NODE BIOPSY Right 09/02/2021   Procedure: RIGHT BREAST LUMPECTOMY WITH RADIOACTIVE SEED AND SENTINEL LYMPH NODE BIOPSY;  Surgeon: Erroll Luna, MD;  Location: [facility];  Service: General;  Laterality: Right;   COLONOSCOPY     HYSTEROSCOPY WITH D & C N/A 12/19/2015   Procedure: DILATATION AND CURETTAGE /HYSTEROSCOPY;  Surgeon: Linda Hedges, DO;  Location: [facility];  Service: Gynecology;  Laterality: N/A;   POLYPECTOMY     PORTACATH PLACEMENT Right 10/16/2021   Procedure: INSERTION PORT-A-CATH;  Surgeon: Erroll Luna, MD;  Location: [facility];  Service: General;  Laterality: Right;   RADIOACTIVE SEED GUIDED PARTIAL MASTECTOMY WITH AXILLARY SENTINEL LYMPH NODE BIOPSY Left 04/17/2016   Procedure: RADIOACTIVE SEED GUIDED PARTIAL MASTECTOMY WITH AXILLARY SENTINEL LYMPH NODE BIOPSY;  Surgeon: Erroll Luna, MD;  Location: [facility];   Service: General;  Laterality: Left;  RADIOACTIVE SEED GUIDED PARTIAL MASTECTOMY WITH AXILLARY SENTINEL LYMPH NODE BIOPSY    TUBAL LIGATION     VAGINAL DELIVERY     x3    FAMILY HISTORY: Family History  Problem Relation Age of Onset   Hypertension Mother    Stroke Mother    Stroke Sister    Alcohol abuse Brother    HIV/AIDS Brother    Breast cancer Cousin        dx 89s   Colon cancer Neg Hx    Colon polyps Neg Hx    Esophageal cancer Neg Hx    Rectal cancer Neg Hx    Stomach cancer Neg Hx     SOCIAL HISTORY:  Social History   Socioeconomic History   Marital status: Married    Spouse name: Not on file   Number of children: 3   Years of education: Not on file   Highest education level: Not on file  Occupational History   Occupation: retired  Tobacco Use   Smoking status: Former    Packs/day: 0.50    Years: 25.00    Total pack years: 12.50    Types: Cigarettes    Quit date: 11/16/2004    Years since quitting: 17.9   Smokeless tobacco: Never  Vaping Use   Vaping Use: Never used  Substance and Sexual Activity   Alcohol use: Not Currently    Comment: occas   Drug use: No   Sexual activity: Yes    Comment: 1st intercourse 61 yo-5 partners  Other Topics Concern   Not on file  Social History Narrative   Diet- N/A   Caffeine- Yes   Married- Yes   House- 2 story with 2 people   Pets- No   Current/past profession- Insurance underwriter daycare   Exercise- Yes   Living will-No   DNR-N/A   POA/HPOA-No      Works part-time at [employer] Strain: High Risk (03/25/2022)   Overall Financial Resource Strain (CARDIA)    Difficulty of Paying Living Expenses: Hard  Food Insecurity: No Food Insecurity (11/24/2017)   Hunger Vital Sign    Worried About Running Out of Food in the Last Year: Never true    Ran Out of Food in the Last Year: Never true  Transportation Needs: No Transportation Needs (11/24/2017)   PRAPARE -  Transportation    Lack  of Transportation (Medical): No    Lack of Transportation (Non-Medical): No  Physical Activity: Insufficiently Active (11/24/2017)   Exercise Vital Sign    Days of Exercise per Week: 1 day    Minutes of Exercise per Session: 90 min  Stress: No Stress Concern Present (11/24/2017)   [place]    Feeling of Stress : Only a little  Social Connections: Moderately Integrated (11/24/2017)   Social Connection and Isolation Panel [NHANES]    Frequency of Communication with Friends and Family: Twice a week    Frequency of Social Gatherings with Friends and Family: Once a week    Attends Religious Services: More than 4 times per year    Active Member of [employer] or Organizations: No    Attends Archivist Meetings: Never    Marital Status: Married  Human resources officer Violence: Not At Risk (11/24/2017)   Humiliation, Afraid, Rape, and Kick questionnaire    Fear of Current or Ex-Partner: No    Emotionally Abused: No    Physically Abused: No    Sexually Abused: No     PHYSICAL EXAM  There were no vitals filed for this visit.   There is no height or weight on file to calculate BMI.   General: The patient is well-developed and well-nourished and in no acute distress   Neurologic Exam  Mental status: The patient is alert and oriented x 3 at the time of the examination. The patient has reduced recent and remote memory, and a reduced  apparently attention span and concentration ability.   Speech has a few errors.  She understands well.  She has a right hand apraxia.  Cranial nerves: Extraocular movements are full. Pupils are equal, round, and reactive to light and accomodation.  Facial strength and sensation is normal. Trapezius and sternocleidomastoid strength is normal. No dysarthria is noted.  The tongue is midline, and the patient has symmetric elevation of the soft palate. No obvious hearing deficits are  noted.  Motor:  Muscle bulk is normal.   Tone is normal. Strength is  5 / 5 in  both arms and the left leg and 4+ over 5 in the right leg.   Sensory: Sensory testing is intact to pinprick, soft touch and vibration sensation in all 4 extremities.  Coordination: Cerebellar testing reveals good  left and mildly reduced right finger-nose-finger and  poor right heel-to-shin . She has difficulty writing  Gait and station: Station is normal.   Gait is wide with slight right foot drop.  Tandem gait is wide.    Romberg is negative.   Reflexes: Deep tendon reflexes are symmetric and normal bilaterally in arms and slightly elevated in the right leg compared to the left.       DIAGNOSTIC DATA (LABS, IMAGING, TESTING) - I reviewed patient records, labs, notes, testing and imaging myself where available.  Lab Results  Component Value Date   WBC 2.1 (L) 10/21/2022   HGB 11.6 (L) 10/21/2022   HCT 33.9 (L) 10/21/2022   MCV 94.7 10/21/2022   PLT 215 10/21/2022      Component Value Date/Time   NA 141 10/21/2022 0939   NA 146 (H) 07/29/2020 1655   NA 141 04/08/2016 0904   K 4.2 10/21/2022 0939   K 4.7 04/08/2016 0904   CL 107 10/21/2022 0939   CO2 29 10/21/2022 0939   CO2 26 04/08/2016 0904   GLUCOSE 114 (H) 10/21/2022 0939   GLUCOSE  124 04/08/2016 0904   BUN 18 10/21/2022 0939   BUN 23 07/29/2020 1655   BUN 16.3 04/08/2016 0904   CREATININE 1.70 (H) 10/21/2022 0939   CREATININE 1.77 (H) 06/05/2022 1329   CREATININE 1.5 (H) 04/08/2016 0904   CALCIUM 10.4 (H) 10/21/2022 0939   CALCIUM 10.0 04/08/2016 0904   PROT 7.1 10/21/2022 0939   PROT 7.6 04/08/2016 0904   ALBUMIN 4.5 10/21/2022 0939   ALBUMIN 4.3 04/08/2016 0904   AST 20 10/21/2022 0939   AST 25 04/08/2016 0904   ALT 19 10/21/2022 0939   ALT 23 04/08/2016 0904   ALKPHOS 90 10/21/2022 0939   ALKPHOS 90 04/08/2016 0904   BILITOT 0.4 10/21/2022 0939   BILITOT 0.58 04/08/2016 0904   GFRNONAA 33 (L) 10/21/2022 0939   GFRNONAA 33  (L) 04/18/2021 1348   GFRAA 38 (L) 04/18/2021 1348    Lab Results  Component Value Date   TSH 1.70 06/24/2018       ASSESSMENT AND PLAN  No diagnosis found.     1.  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   We will check an MRI of the brain.   Not currently on a disease modifying therapy.  If any subclinical progression would recommend a DMT, otherwise she will remain off.    2.   We discussed the importance of staying on her hypertensive medication 3.    She will return to see me in one year or sooner if there are new or worsening neurologic symptoms. Aryani Daffern A. Felecia Shelling, MD, PhD 11/24/3233, 5:73 PM Certified in Neurology, Clinical Neurophysiology, Sleep Medicine, Pain Medicine and Neuroimaging  [facility] [address] [place], [place] [address] [phone]

## 2022-11-05 ENCOUNTER — Ambulatory Visit (INDEPENDENT_AMBULATORY_CARE_PROVIDER_SITE_OTHER): Payer: Medicare HMO | Admitting: Neurology

## 2022-11-05 ENCOUNTER — Encounter: Payer: Self-pay | Admitting: Neurology

## 2022-11-05 VITALS — BP 165/79 | HR 85 | Ht 62.0 in | Wt 204.5 lb

## 2022-11-05 DIAGNOSIS — G35D Multiple sclerosis, unspecified: Secondary | ICD-10-CM

## 2022-11-05 DIAGNOSIS — C50412 Malignant neoplasm of upper-outer quadrant of left female breast: Secondary | ICD-10-CM

## 2022-11-05 DIAGNOSIS — R270 Ataxia, unspecified: Secondary | ICD-10-CM

## 2022-11-05 DIAGNOSIS — G35 Multiple sclerosis: Secondary | ICD-10-CM

## 2022-11-05 DIAGNOSIS — Z170 Estrogen receptor positive status [ER+]: Secondary | ICD-10-CM

## 2022-11-05 DIAGNOSIS — M542 Cervicalgia: Secondary | ICD-10-CM

## 2022-11-05 DIAGNOSIS — G4489 Other headache syndrome: Secondary | ICD-10-CM

## 2022-11-05 DIAGNOSIS — N1832 Chronic kidney disease, stage 3b: Secondary | ICD-10-CM

## 2022-11-05 DIAGNOSIS — I610 Nontraumatic intracerebral hemorrhage in hemisphere, subcortical: Secondary | ICD-10-CM | POA: Diagnosis not present

## 2022-11-05 DIAGNOSIS — I10 Essential (primary) hypertension: Secondary | ICD-10-CM | POA: Diagnosis not present

## 2022-11-10 ENCOUNTER — Inpatient Hospital Stay: Payer: Medicare HMO | Admitting: Pharmacist

## 2022-11-10 ENCOUNTER — Encounter: Payer: Self-pay | Admitting: Hematology and Oncology

## 2022-11-10 ENCOUNTER — Inpatient Hospital Stay: Payer: Medicare HMO

## 2022-11-10 VITALS — BP 153/76 | HR 83 | Temp 97.5°F | Resp 18 | Ht 62.0 in | Wt 205.7 lb

## 2022-11-10 DIAGNOSIS — Z170 Estrogen receptor positive status [ER+]: Secondary | ICD-10-CM

## 2022-11-10 DIAGNOSIS — C50411 Malignant neoplasm of upper-outer quadrant of right female breast: Secondary | ICD-10-CM | POA: Diagnosis not present

## 2022-11-10 LAB — CBC WITH DIFFERENTIAL (CANCER CENTER ONLY)
Abs Immature Granulocytes: 0.01 10*3/uL (ref 0.00–0.07)
Basophils Absolute: 0.1 10*3/uL (ref 0.0–0.1)
Basophils Relative: 2 %
Eosinophils Absolute: 0 10*3/uL (ref 0.0–0.5)
Eosinophils Relative: 1 %
HCT: 32.9 % — ABNORMAL LOW (ref 36.0–46.0)
Hemoglobin: 11.4 g/dL — ABNORMAL LOW (ref 12.0–15.0)
Immature Granulocytes: 0 %
Lymphocytes Relative: 31 %
Lymphs Abs: 0.9 10*3/uL (ref 0.7–4.0)
MCH: 32.8 pg (ref 26.0–34.0)
MCHC: 34.7 g/dL (ref 30.0–36.0)
MCV: 94.5 fL (ref 80.0–100.0)
Monocytes Absolute: 0.1 10*3/uL (ref 0.1–1.0)
Monocytes Relative: 4 %
Neutro Abs: 1.8 10*3/uL (ref 1.7–7.7)
Neutrophils Relative %: 62 %
Platelet Count: 336 10*3/uL (ref 150–400)
RBC: 3.48 MIL/uL — ABNORMAL LOW (ref 3.87–5.11)
RDW: 14.6 % (ref 11.5–15.5)
Smear Review: NORMAL
WBC Count: 2.8 10*3/uL — ABNORMAL LOW (ref 4.0–10.5)
nRBC: 0 % (ref 0.0–0.2)

## 2022-11-10 LAB — CMP (CANCER CENTER ONLY)
ALT: 16 U/L (ref 0–44)
AST: 25 U/L (ref 15–41)
Albumin: 4.6 g/dL (ref 3.5–5.0)
Alkaline Phosphatase: 90 U/L (ref 38–126)
Anion gap: 10 (ref 5–15)
BUN: 19 mg/dL (ref 8–23)
CO2: 24 mmol/L (ref 22–32)
Calcium: 9.8 mg/dL (ref 8.9–10.3)
Chloride: 108 mmol/L (ref 98–111)
Creatinine: 1.76 mg/dL — ABNORMAL HIGH (ref 0.44–1.00)
GFR, Estimated: 31 mL/min — ABNORMAL LOW (ref >60)
Glucose, Bld: 147 mg/dL — ABNORMAL HIGH (ref 70–99)
Potassium: 3.5 mmol/L (ref 3.5–5.1)
Sodium: 142 mmol/L (ref 135–145)
Total Bilirubin: 0.4 mg/dL (ref 0.3–1.2)
Total Protein: 7.3 g/dL (ref 6.5–8.1)

## 2022-11-10 NOTE — Progress Notes (Signed)
[facility]       Telephone: [phone]?Fax: [phone]   Oncology Clinical Pharmacist Practitioner Progress Note  Michele Page was contacted via in-person visit to discuss her chemotherapy regimen for palbociclib which they receive under the care of Dr. Nicholas Lose.   Current treatment regimen and start date Palbociclib (09/28/22) Letrozole (06/05/22)  Interval History She continues on palbociclib 100 mg by mouth  daily on days 1 to 21 of a 28-day cycle. This is being given  in combination with letrozole . Therapy is planned to continue until disease progression or unacceptable toxicity.  Michele Page was seen Page by clinical pharmacy as a follow-up to her palbociclib management.  She was last seen by clinical pharmacy on 10/21/22 and Dr. Lindi Adie on 10/12/22.  She saw [name], her PCP, who manages her other comorbidities on 10/23/22. She also saw Dr. Felecia Shelling from neurology on 11/05/22 who is ordered a MRI of the brain for probable MS.  She is currently on her third week of palbociclib.  She started her last cycle on 10/26/22.  Response to Therapy From a palbociclib perspective, Michele Page is tolerating this agent well.  She does continue to say that her blood pressures have been elevated at home since starting palbociclib.  We again discussed that although palbociclib is not known to cause elevated blood pressure, there is always a chance that this could occur.  She also stated that she will be trying to limit her salt intake and doing some diet modification starting the new year.  We did discuss that if her blood pressures remain elevated, she could speak to nurse practitioner [name] about possibly modifying her current blood pressure medications.  We did discuss that although lowering the dose of palbociclib is an option, we would prefer to not do that if she is tolerating palbociclib well, which she is currently.  She will follow-up with clinical pharmacy in 2 weeks  and then Dr. Lindi Adie in 4 weeks.  At that point if she continues to tolerate palbociclib well, she can likely go to monthly labs per the manufacturing guidelines.  Labs, vitals, treatment parameters, and manufacturer guidelines assessing toxicity were reviewed with Michele Page. Based on these values, patient is in agreement to continue palbociclib therapy at this time.  Allergies No Known Allergies  Vitals    11/10/2022    1:54 PM 11/05/2022   12:52 PM 10/23/2022   12:58 PM  Oncology Vitals  Height 158 cm 158 cm 158 cm  Weight 93.305 kg 92.761 kg 90.992 kg  Weight (lbs) 205 lbs 11 oz 204 lbs 8 oz 200 lbs 10 oz  BMI 37.62 kg/m2   37.62 kg/m2 37.4 kg/m2   37.4 kg/m2 36.69 kg/m2   36.69 kg/m2  Temp 97.5 F (36.4 C)  98.6 F (37 C)  Pulse Rate 83 85 77  BP 153/76 165/79 122/68  Resp 18    SpO2 99 %  98 %  BSA (m2) 2.02 m2   2.02 m2 2.01 m2   2.01 m2 2 m2   2 m2     Laboratory Data    Latest Ref Rng & Units 11/10/2022   12:16 PM 10/21/2022    9:39 AM 07/06/2022   11:52 AM  CBC EXTENDED  WBC 4.0 - 10.5 K/uL 2.8  2.1  5.2   RBC 3.87 - 5.11 MIL/uL 3.48  3.58  3.41   Hemoglobin 12.0 - 15.0 g/dL 11.4  11.6  11.2  HCT 36.0 - 46.0 % 32.9  33.9  31.6   Platelets 150 - 400 K/uL 336  215  246   NEUT# 1.7 - 7.7 K/uL 1.8  1.2  3.6   Lymph# 0.7 - 4.0 K/uL 0.9  0.7  0.9        Latest Ref Rng & Units 11/10/2022   12:16 PM 10/21/2022    9:39 AM 07/06/2022   11:52 AM  CMP  Glucose 70 - 99 mg/dL 147  114  130   BUN 8 - 23 mg/dL '19  18  20   '$ Creatinine 0.44 - 1.00 mg/dL 1.76  1.70  1.52   Sodium 135 - 145 mmol/L 142  141  140   Potassium 3.5 - 5.1 mmol/L 3.5  4.2  3.4   Chloride 98 - 111 mmol/L 108  107  106   CO2 22 - 32 mmol/L '24  29  28   '$ Calcium 8.9 - 10.3 mg/dL 9.8  10.4  9.7   Total Protein 6.5 - 8.1 g/dL 7.3  7.1  6.6   Total Bilirubin 0.3 - 1.2 mg/dL 0.4  0.4  0.4   Alkaline Phos 38 - 126 U/L 90  90  80   AST 15 - 41 U/L '25  20  23   '$ ALT 0 - 44 U/L '16  19  19      '$ Adverse Effects Assessment Elevated serum creatinine: Slightly increased from last visit.  Will continue to monitor.  Encouraged patient to increase fluid intake and she verbalized understanding of the plan. Calcium: Was elevated, now WNL Neutropenia: Improved Hemoglobin: Slightly decreased from last time.  Will continue to monitor.  Adherence Assessment Michele Page reports missing 0 doses over the past 3 weeks.   Reason for missed dose: N/A Patient was re-educated on importance of adherence.   Access Assessment Michele Page is currently receiving her palbociclib through [facility] concerns: None  Medication Reconciliation The patient's medication list was reviewed Page with the patient?  Yes New medications or herbal supplements have recently been started?  No Any medications have been discontinued?  No The medication list was updated and reconciled based on the patient's most recent medication list in the electronic medical record (EMR) including herbal products and OTC medications.   Medications Current Outpatient Medications  Medication Sig Dispense Refill   aspirin EC 81 MG tablet Take 1 tablet (81 mg total) by mouth daily. 30 tablet 6   atorvastatin (LIPITOR) 20 MG tablet Take 1 tablet (20 mg total) by mouth daily. 90 tablet 1   eszopiclone (LUNESTA) 2 MG TABS tablet Take one tablet by mouth at bedtime as needed for sleep (take immediately before bedtime) 30 tablet 3   labetalol (NORMODYNE) 300 MG tablet TAKE 2 TABLETS THREE TIMES DAILY 540 tablet 2   letrozole (FEMARA) 2.5 MG tablet Take 1 tablet (2.5 mg total) by mouth daily. 90 tablet 3   NIFEdipine (PROCARDIA XL/NIFEDICAL XL) 60 MG 24 hr tablet Take 1 tablet (60 mg total) by mouth 2 (two) times daily. 180 tablet 3   palbociclib (IBRANCE) 100 MG tablet Take 1 tablet (100 mg total) by mouth daily. Take for 21 days on, 7 days off, repeat every 28 days. 21 tablet 10   No current  facility-administered medications for this visit.    Drug-Drug Interactions (DDIs) DDIs were evaluated?  Yes Significant DDIs?  No The patient was instructed to speak with their health care provider and/or  the oral chemotherapy pharmacist before starting any new drug, including prescription or over the counter, natural / herbal products, or vitamins.  Supportive Care Fever: reviewed the importance of having a thermometer and the Centers for Disease Control and Prevention (CDC) definition of fever which is 100.61F (38C) or higher. Patient should call 24/7 triage at [phone] if experiencing a fever or any other symptoms Myelosuppression ILD / Pneumonitis Fatigue Alopecia N/V/D  Dosing Assessment Hepatic adjustments needed?  No Renal adjustments needed?  No Toxicity adjustments needed?  No The current dosing regimen is appropriate to continue at this time.  Follow-Up Plan Continue palbociclib 100 mg by mouth daily for 21 days, followed by a 7-day rest period. Continue letrozole 2.5 mg by mouth daily Continue to follow with nurse practitioner [name] for other comorbidities including hypertension.  We will forward our visit note from Page per patient's request as her blood pressure remains elevated at home per patient's report. Continue to monitor ANC, hemoglobin, calcium, serum creatinine Labs, pharmacy clinic visit, on 11/23/22 Labs, Dr. Lindi Adie visit, on 12/07/22  Michele Page participated in the discussion, expressed understanding, and voiced agreement with the above plan. All questions were answered to her satisfaction. The patient was advised to contact the clinic at [phone] with any questions or concerns prior to her return visit.   I spent 30 minutes assessing and educating the patient.  Raina Mina, RPH-CPP, 11/10/2022  2:14 PM  **Disclaimer: This note was dictated with voice recognition software. Similar sounding words can inadvertently be transcribed  and this note may contain transcription errors which may not have been corrected upon publication of note.**

## 2022-11-10 NOTE — Progress Notes (Signed)
Received call from patient after receiving my card. Advised patient I wanted to be sure she had my contact information for any other financial questions or concerns.  Discussed one-time $1000 [employer] grant to assist with personal expenses while going through treatment. Advised what is needed to apply and she may bring to her appointment on 1/8 to present to registration to scan and email to me. She will then be given grant paperwork to complete. Advised her to contact me at her earliest convenience afterwards to discuss expenses in detail. She verbalized understanding.  She has my card for any additional financial questions or concerns.

## 2022-11-11 ENCOUNTER — Ambulatory Visit: Payer: Medicare HMO | Admitting: Rehabilitation

## 2022-11-11 ENCOUNTER — Encounter: Payer: Self-pay | Admitting: Rehabilitation

## 2022-11-11 ENCOUNTER — Telehealth: Payer: Self-pay | Admitting: Neurology

## 2022-11-11 ENCOUNTER — Other Ambulatory Visit (HOSPITAL_COMMUNITY): Payer: Self-pay

## 2022-11-11 DIAGNOSIS — Z483 Aftercare following surgery for neoplasm: Secondary | ICD-10-CM | POA: Diagnosis not present

## 2022-11-11 DIAGNOSIS — Z170 Estrogen receptor positive status [ER+]: Secondary | ICD-10-CM | POA: Diagnosis not present

## 2022-11-11 DIAGNOSIS — C50411 Malignant neoplasm of upper-outer quadrant of right female breast: Secondary | ICD-10-CM | POA: Diagnosis not present

## 2022-11-11 DIAGNOSIS — I890 Lymphedema, not elsewhere classified: Secondary | ICD-10-CM | POA: Diagnosis not present

## 2022-11-11 LAB — CANCER ANTIGEN 27.29: CA 27.29: 11.2 U/mL (ref 0.0–38.6)

## 2022-11-11 NOTE — Therapy (Signed)
OUTPATIENT PHYSICAL THERAPY ONCOLOGY TREATMENT  Patient Name: Michele Page MRN: [id number] DOB:05/15/1956, 66 y.o., female Today's Date: 11/11/2022  END OF SESSION:  PT End of Session - 11/11/22 1210     Visit Number 3    Number of Visits 6    Date for PT Re-Evaluation 11/26/22    Authorization Type 6 visits until 11/26/22    PT Start Time 1212   arrived late   PT Stop Time 1259    PT Time Calculation (min) 47 min    Activity Tolerance Patient tolerated treatment well    Behavior During Therapy [place] for tasks assessed/performed             Past Medical History:  Diagnosis Date   Anemia    past hx many yrs ago    Arthritis    lt knee   Breast cancer ([place])    Breast cancer of upper-outer quadrant of left female breast ([place]) 04/02/2016   Chronic kidney disease    CKD   Colon polyps    COPD (chronic obstructive pulmonary disease) ([place])    GERD (gastroesophageal reflux disease)    years ago- not current    Hyperlipidemia    Hypertension    Multiple sclerosis ([place])    Personal history of chemotherapy    Personal history of radiation therapy    Stroke ([place])    2013, mild cognitive deficits   Vision abnormalities    Past Surgical History:  Procedure Laterality Date   BREAST BIOPSY Left 03/31/2016   BREAST BIOPSY Right 07/24/2021   BREAST LUMPECTOMY Left 04/17/2016   BREAST LUMPECTOMY Right 09/02/2021   BREAST LUMPECTOMY WITH RADIOACTIVE SEED AND SENTINEL LYMPH NODE BIOPSY Right 09/02/2021   Procedure: RIGHT BREAST LUMPECTOMY WITH RADIOACTIVE SEED AND SENTINEL LYMPH NODE BIOPSY;  Surgeon: Erroll Luna, MD;  Location: [facility];  Service: General;  Laterality: Right;   COLONOSCOPY     HYSTEROSCOPY WITH D & C N/A 12/19/2015   Procedure: DILATATION AND CURETTAGE /HYSTEROSCOPY;  Surgeon: Linda Hedges, DO;  Location: [facility];  Service: Gynecology;  Laterality: N/A;   POLYPECTOMY     PORTACATH PLACEMENT Right 10/16/2021   Procedure: INSERTION PORT-A-CATH;   Surgeon: Erroll Luna, MD;  Location: [facility];  Service: General;  Laterality: Right;   RADIOACTIVE SEED GUIDED PARTIAL MASTECTOMY WITH AXILLARY SENTINEL LYMPH NODE BIOPSY Left 04/17/2016   Procedure: RADIOACTIVE SEED GUIDED PARTIAL MASTECTOMY WITH AXILLARY SENTINEL LYMPH NODE BIOPSY;  Surgeon: Erroll Luna, MD;  Location: [facility];  Service: General;  Laterality: Left;  RADIOACTIVE SEED GUIDED PARTIAL MASTECTOMY WITH AXILLARY SENTINEL LYMPH NODE BIOPSY    TUBAL LIGATION     VAGINAL DELIVERY     x3   Patient Active Problem List   Diagnosis Date Noted   Genetic testing 03/05/2022   Port-A-Cath in place 11/06/2021   Malignant neoplasm of upper-outer quadrant of right breast in female, estrogen receptor positive ([place]) 08/04/2021   Class 2 severe obesity due to excess calories with serious comorbidity and body mass index (BMI) of 37.0 to 37.9 in adult [facility]) 11/18/2020   Neck pain on right side 06/01/2018   History of breast cancer 11/25/2017   Breast cancer of upper-outer quadrant of left female breast ([place]) 04/02/2016   Essential hypertension 01/10/2016   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 01/10/2016   Gastroesophageal reflux disease without esophagitis 01/10/2016   Hyperlipidemia 01/10/2016   CKD (chronic kidney disease) 01/10/2016  Multiple sclerosis ([place]) 03/20/2015   Accelerated hypertension 03/20/2015   Cerebral infarction ([place]) 03/20/2015   Hemiplegia following CVA (cerebrovascular accident) ([place]) 03/20/2015    PCP: [name], NP  REFERRING PROVIDER: Dr. Lindi Adie   REFERRING DIAG: Lymphedema   THERAPY DIAG:  Malignant neoplasm of upper-outer quadrant of right breast in female, estrogen receptor positive [facility])  Aftercare following surgery for neoplasm  Lymphedema, not elsewhere classified  ONSET DATE: 09/30/22  Rationale for Evaluation and Treatment: Rehabilitation  SUBJECTIVE:                                                                                                                                                                                            SUBJECTIVE STATEMENT:  I got my new glove.  My hand was pretty much normal after you wrapped it.  My husband is too nervous to do it but I think I was able to do it well.   PERTINENT HISTORY: -History of right lumpectomy 09/02/21 with 4/6 nodes positive with chemotherapy and radiation completed, Hx of Lt lumpectomy in 2017 with 4 negative nodes removed and radiation.  Recently started [facility] 09/28/22. DVT study is negative, does show subcutaneous edema edema on dorsal aspect of right hand and distal forearm.     PAIN:  Are you having pain? No  PRECAUTIONS: bil lymphedema risk   WEIGHT BEARING RESTRICTIONS: No  FALLS:  Has patient fallen in last 6 months? 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  HAND DOMINANCE: right   PRIOR LEVEL OF FUNCTION: Independent  PATIENT GOALS: what to do for the swelling    OBJECTIVE:  COGNITION: Overall cognitive status: Within functional limits for tasks assessed   PALPATION: Non pitting, slight puffiness back of band  OBSERVATIONS / OTHER ASSESSMENTS: puffy back of hand  POSTURE: rounded shoulders, forward head  UPPER EXTREMITY AROM/PROM:  A/PROM RIGHT   eval   Shoulder extension 30-tight  Shoulder flexion 135 - pain  Shoulder abduction 160 - pain  Shoulder internal rotation   Shoulder external rotation     (Blank rows = not tested)  A/PROM LEFT   eval  Shoulder extension 70  Shoulder flexion 155  Shoulder abduction 165  Shoulder internal rotation   Shoulder external rotation     (Blank rows = not tested)  CERVICAL AROM: All within normal limits:   LYMPHEDEMA ASSESSMENTS:   LANDMARK RIGHT  eval  At axilla 33  15 cm 32   10 cm proximal to olecranon  process 30.4  Olecranon process 27.8  15 cm 27.5  10 cm proximal to ulnar styloid process 23.5  Just proximal to ulnar styloid process 18.5  Across hand at thumb web space 22  At base of 2nd digit 6.9  (Blank rows = not tested) 0.7 length 2072m  LANDMARK LEFT  eval 11/02/22  At axilla 33.5 33.5  15 cm 33 33  10 cm proximal to olecranon process 30.7 30.5  Olecranon process '26 28  15 '$ cm  26.4 27.2  10 cm proximal to ulnar styloid process 23 22.8  Just proximal to ulnar styloid process 16.5 17.2  Across hand at thumb web space 22 22  At base of 2nd digit 6.4 7.0  (Blank rows = not tested)  1983.615m  11634mifference   TODAY'S TREATMENT:                                                                                                                                         DATE:  11/11/22 Donned pts new glove which fits well.  Reviewed hand bandaging with the patient using a finger wrap and cueing as needed.  Education on self MLD today with PT reading and demonstrating all steps and then pt performing with cueing as needed.  - per instruction section for today.   Pt will wear day garments and then bandage and night and perform MLD as able.    11/02/22: Remeasured UE which demonstrates some increase in the lower arm and hand with soft pitting at the dorsum of the hand. Pt received her sleeve and glove.  The sleeve seems to fit well but the glove is too small and is the wrong color.  Remeasured hand and will move up to size large and change of colors.  Pt will also try a half finger as the full finger is over her nail bed.  Emailed LizKathlee Nationsd asked for rush reorder.   Discussed options again and demonstrated bandaging for pt in clinic.  She decided that it was not as bad as she thought it would be but is still not able to come in very often due to work schedule.  She thinks her spouse would be willing to help.  Applied the following: tg soft medium inside  out, fingers 1-5, artiflex from hand to axilla, 1 6cm hand bandage, 1 8cm and 1 10cm from wrist to axilla.  With vcs and instruction throughout.  [facility] "caregiver bandaging your arm".  Pt was instructed to wear this for up to 48 hours and then remove and have husband practice as able.  Pt was instructed on bandage care, when to remove, etc. Pt will bandage as much as possible and then switch over to her new glove as able with bandaging over night if needed.    10/15/22 Discussed CDT options with patient who will  not be able to bandage due to working at [employer].  Due to only minimal volume difference between sides we decided to order an exostrong sleeve and glove size medium black which was sent to [facility] as an alight order.  Pt was notified that her insurance should start covering garments in the new year.  Discussed night options which pt does not seem to need currently.  Demonstrated donning and discussed wearing only during the day.  Gave pt shoulder exercises from post op sheet as she also has limited AROM in the right shoulder  PATIENT EDUCATION:  Education details: per today's note Person educated: Patient Education method: Consulting civil engineer, Demonstration, Verbal cues, and Handouts Education comprehension: verbalized understanding and returned demonstration  HOME EXERCISE PROGRAM: Post op shoulder  ASSESSMENT:  CLINICAL IMPRESSION: Pt noting how well bandaging works and feels like she was able to do this at home.  New garments seem to fit well and pt will start wearing these during the day.  Pt did well with MLD needing cueing for not squeezing during stretch and light pressure.  Pt is interested in a night garment circaid and will measure and order for this after the new year.      OBJECTIVE IMPAIRMENTS: decreased activity tolerance, decreased knowledge of use of DME, decreased ROM, increased edema, and impaired UE functional use.   ACTIVITY LIMITATIONS:  carrying, lifting, and reach over head  PARTICIPATION LIMITATIONS: meal prep, cleaning, and community activity  PERSONAL FACTORS: Age, Fitness, and 1-2 comorbidities: bil cancer status, radiation and chemo hx  are also affecting patient's functional outcome.   REHAB POTENTIAL: Good  CLINICAL DECISION MAKING: Evolving/moderate complexity  EVALUATION COMPLEXITY: Moderate  GOALS: Goals reviewed with patient? Yes  SHORT TERM GOALS/LTGs: Target date: 11/26/22  Pt will obtain compression garments for the Rt UE Baseline: Goal status: INITIAL  2.  Pt will be ind with self MLD for the UE Baseline:  Goal status: INITIAL  3.  Pt will be ind with exercises for the Rt shoulder to improve reach and mobility Baseline:  Goal status: INITIAL   PLAN: PT FREQUENCY: 1x/week  PT DURATION: 6 weeks  PLANNED INTERVENTIONS: Therapeutic exercises, Patient/Family education, Self Care, Manual therapy, and Re-evaluation  PLAN FOR NEXT SESSION: Remeasure UE, review UE MLD and review bandaging as needed. Any extra time Rt shoulder PROM/AAROM may be interested in - order night garment and any more day garments? pulleys - wants fewer visits.    Stark Bray, PT 11/11/2022, 2:06 PM

## 2022-11-11 NOTE — Telephone Encounter (Signed)
Craig Staggers: [phone] exp. 11/11/22-12/11/22 sent to GI [phone]

## 2022-11-11 NOTE — Patient Instructions (Signed)

## 2022-11-12 ENCOUNTER — Other Ambulatory Visit (HOSPITAL_COMMUNITY): Payer: Self-pay

## 2022-11-13 ENCOUNTER — Telehealth: Payer: Self-pay

## 2022-11-13 NOTE — Telephone Encounter (Signed)
[employer] insurance called to have records faxed to them.  Faxed records to [phone].  Gardiner Rhyme, RN

## 2022-11-17 ENCOUNTER — Other Ambulatory Visit (HOSPITAL_COMMUNITY): Payer: Self-pay

## 2022-11-17 ENCOUNTER — Other Ambulatory Visit: Payer: Self-pay

## 2022-11-23 ENCOUNTER — Encounter: Payer: Self-pay | Admitting: Hematology and Oncology

## 2022-11-23 ENCOUNTER — Other Ambulatory Visit: Payer: Self-pay

## 2022-11-23 ENCOUNTER — Inpatient Hospital Stay: Payer: Medicare HMO | Admitting: Pharmacist

## 2022-11-23 ENCOUNTER — Inpatient Hospital Stay: Payer: Medicare HMO | Attending: Hematology and Oncology

## 2022-11-23 ENCOUNTER — Other Ambulatory Visit (HOSPITAL_COMMUNITY): Payer: Self-pay

## 2022-11-23 VITALS — BP 151/74 | HR 88 | Temp 97.4°F | Resp 18 | Ht 62.0 in | Wt 202.3 lb

## 2022-11-23 DIAGNOSIS — Z170 Estrogen receptor positive status [ER+]: Secondary | ICD-10-CM

## 2022-11-23 DIAGNOSIS — C50412 Malignant neoplasm of upper-outer quadrant of left female breast: Secondary | ICD-10-CM | POA: Insufficient documentation

## 2022-11-23 DIAGNOSIS — C50411 Malignant neoplasm of upper-outer quadrant of right female breast: Secondary | ICD-10-CM | POA: Diagnosis not present

## 2022-11-23 DIAGNOSIS — Z79811 Long term (current) use of aromatase inhibitors: Secondary | ICD-10-CM | POA: Insufficient documentation

## 2022-11-23 LAB — CBC WITH DIFFERENTIAL (CANCER CENTER ONLY)
Abs Immature Granulocytes: 0.01 10*3/uL (ref 0.00–0.07)
Basophils Absolute: 0 10*3/uL (ref 0.0–0.1)
Basophils Relative: 2 %
Eosinophils Absolute: 0 10*3/uL (ref 0.0–0.5)
Eosinophils Relative: 1 %
HCT: 31.4 % — ABNORMAL LOW (ref 36.0–46.0)
Hemoglobin: 11.2 g/dL — ABNORMAL LOW (ref 12.0–15.0)
Immature Granulocytes: 0 %
Lymphocytes Relative: 33 %
Lymphs Abs: 0.9 10*3/uL (ref 0.7–4.0)
MCH: 33.7 pg (ref 26.0–34.0)
MCHC: 35.7 g/dL (ref 30.0–36.0)
MCV: 94.6 fL (ref 80.0–100.0)
Monocytes Absolute: 0.2 10*3/uL (ref 0.1–1.0)
Monocytes Relative: 6 %
Neutro Abs: 1.6 10*3/uL — ABNORMAL LOW (ref 1.7–7.7)
Neutrophils Relative %: 58 %
Platelet Count: 226 10*3/uL (ref 150–400)
RBC: 3.32 MIL/uL — ABNORMAL LOW (ref 3.87–5.11)
RDW: 14.9 % (ref 11.5–15.5)
WBC Count: 2.7 10*3/uL — ABNORMAL LOW (ref 4.0–10.5)
nRBC: 0 % (ref 0.0–0.2)

## 2022-11-23 LAB — CMP (CANCER CENTER ONLY)
ALT: 17 U/L (ref 0–44)
AST: 25 U/L (ref 15–41)
Albumin: 4.7 g/dL (ref 3.5–5.0)
Alkaline Phosphatase: 75 U/L (ref 38–126)
Anion gap: 9 (ref 5–15)
BUN: 18 mg/dL (ref 8–23)
CO2: 25 mmol/L (ref 22–32)
Calcium: 9.8 mg/dL (ref 8.9–10.3)
Chloride: 106 mmol/L (ref 98–111)
Creatinine: 1.82 mg/dL — ABNORMAL HIGH (ref 0.44–1.00)
GFR, Estimated: 30 mL/min — ABNORMAL LOW (ref >60)
Glucose, Bld: 132 mg/dL — ABNORMAL HIGH (ref 70–99)
Potassium: 3.6 mmol/L (ref 3.5–5.1)
Sodium: 140 mmol/L (ref 135–145)
Total Bilirubin: 0.4 mg/dL (ref 0.3–1.2)
Total Protein: 6.8 g/dL (ref 6.5–8.1)

## 2022-11-23 MED ORDER — PALBOCICLIB 75 MG PO TABS
75.0000 mg | ORAL_TABLET | Freq: Every day | ORAL | 3 refills | Status: DC
  Filled 2022-11-23: qty 21, 21d supply, fill #0
  Filled 2022-11-23: qty 21, 28d supply, fill #0
  Filled 2022-12-21: qty 21, 28d supply, fill #1
  Filled 2023-01-14: qty 21, 28d supply, fill #2
  Filled 2023-02-11: qty 21, 28d supply, fill #3

## 2022-11-23 NOTE — Progress Notes (Signed)
[place]       Telephone: [phone]?Fax: [phone]   Oncology Clinical Pharmacist Practitioner Progress Note  LING FLESCH was contacted via in-person visit to discuss her chemotherapy regimen for palbociclib which they receive under the care of Dr. Nicholas Lose.    Current treatment regimen and start date Palbociclib (09/28/22) Letrozole (06/05/22)   Interval History She continues on palbociclib 100 mg by mouth  daily on days 1 to 21 of a 28-day cycle. This is being given  in combination with letrozole . Therapy is planned to continue until disease progression or unacceptable toxicity.  Ms. Gartner was seen today by clinical pharmacy as a follow-up to her palbociclib management.  She was last seen by clinical pharmacy on 11/10/22 and Dr. Lindi Adie on 09/18/22.  She has her MRI scheduled for tomorrow by Dr. Felecia Shelling in neurology and will be contacting [name] NP about her blood pressure medications sometime today per her report.  Response to Therapy Overall, from a lab perspective, Ms. Amaker appears to be tolerating palbociclib fairly well.  However, she is reporting fatigue and at this time would like to reduce the dose of palbociclib to 75 mg daily 21/28-day cycle.  We have discontinued her current dose and have sent a new prescription for palbociclib to the [facility] outpatient pharmacy.  She will see Dr. Lindi Adie next on 12/07/22 and she reports today that she just started her off week of the palbociclib.  Therefore, we have put a start date for her new prescription scheduled for 11/30/22.  She will try to bring in any unopened palbociclib to her next visit with Dr. Lindi Adie.  Clinical pharmacy will tentatively see her back on 12/28/22 which should line up with her next cycle start of palbociclib. Labs, vitals, treatment parameters, and manufacturer guidelines assessing toxicity were reviewed with Franki Cabot today. Based on these values, patient is in agreement to [place] therapy at this time.  Allergies No Known Allergies  Vitals    11/23/2022    1:59 PM 11/10/2022    1:54 PM 11/05/2022   12:52 PM  Oncology Vitals  Height 158 cm 158 cm 158 cm  Weight 91.763 kg 93.305 kg 92.761 kg  Weight (lbs) 202 lbs 5 oz 205 lbs 11 oz 204 lbs 8 oz  BMI 37 kg/m2   37 kg/m2 37.62 kg/m2   37.62 kg/m2 37.4 kg/m2   37.4 kg/m2  Temp 97.4 F (36.3 C) 97.5 F (36.4 C)   Pulse Rate 88 83 85  BP 151/74 153/76 165/79  Resp 18 18   SpO2 98 % 99 %   BSA (m2) 2 m2   2 m2 2.02 m2   2.02 m2 2.01 m2   2.01 m2    Laboratory Data    Latest Ref Rng & Units 11/23/2022   12:45 PM 11/10/2022   12:16 PM 10/21/2022    9:39 AM  CBC EXTENDED  WBC 4.0 - 10.5 K/uL 2.7  2.8  2.1   RBC 3.87 - 5.11 MIL/uL 3.32  3.48  3.58   Hemoglobin 12.0 - 15.0 g/dL 11.2  11.4  11.6   HCT 36.0 - 46.0 % 31.4  32.9  33.9   Platelets 150 - 400 K/uL 226  336  215   NEUT# 1.7 - 7.7 K/uL 1.6  1.8  1.2   Lymph# 0.7 - 4.0 K/uL 0.9  0.9  0.7        Latest Ref Rng &  Units 11/23/2022   12:45 PM 11/10/2022   12:16 PM 10/21/2022    9:39 AM  CMP  Glucose 70 - 99 mg/dL 132  147  114   BUN 8 - 23 mg/dL '18  19  18   '$ Creatinine 0.44 - 1.00 mg/dL 1.82  1.76  1.70   Sodium 135 - 145 mmol/L 140  142  141   Potassium 3.5 - 5.1 mmol/L 3.6  3.5  4.2   Chloride 98 - 111 mmol/L 106  108  107   CO2 22 - 32 mmol/L '25  24  29   '$ Calcium 8.9 - 10.3 mg/dL 9.8  9.8  10.4   Total Protein 6.5 - 8.1 g/dL 6.8  7.3  7.1   Total Bilirubin 0.3 - 1.2 mg/dL 0.4  0.4  0.4   Alkaline Phos 38 - 126 U/L 75  90  90   AST 15 - 41 U/L '25  25  20   '$ ALT 0 - 44 U/L '17  16  19     '$ Adverse Effects Assessment Fatigue: As stated above, Ms. Crutcher is not interested in continuing palbociclib 100 mg at this time.  However, she is willing to try a reduced dose of palbociclib which has been sent to the [facility]. Serum creatinine: Slightly elevated from last visit.  We again discussed with Ms. Kienitz the importance  of drinking plenty of fluids and we will continue to closely monitor. Hemoglobin: Stable.  Will continue to monitor. ANC: Slightly decreased from last visit.  Will continue to monitor.  Adherence Assessment RAIN WILHIDE reports missing 0 doses over the past 2 weeks.  Ms. Cannell again confirmed that she has started her off week of palbociclib today. Reason for missed dose: N/A Patient was re-educated on importance of adherence.   Access Assessment ZYIONNA PESCE is currently receiving her palbociclib through [facility] concerns: None  Medication Reconciliation The patient's medication list was reviewed today with the patient?  Yes New medications or herbal supplements have recently been started?  No Any medications have been discontinued?  No The medication list was updated and reconciled based on the patient's most recent medication list in the electronic medical record (EMR) including herbal products and OTC medications.   Medications Current Outpatient Medications  Medication Sig Dispense Refill   [START ON 11/30/2022] palbociclib (IBRANCE) 75 MG tablet Take 1 tablet (75 mg total) by mouth daily. Take for 21 days on, 7 days off, repeat every 28 days. 21 tablet 3   aspirin EC 81 MG tablet Take 1 tablet (81 mg total) by mouth daily. 30 tablet 6   atorvastatin (LIPITOR) 20 MG tablet Take 1 tablet (20 mg total) by mouth daily. 90 tablet 1   eszopiclone (LUNESTA) 2 MG TABS tablet Take one tablet by mouth at bedtime as needed for sleep (take immediately before bedtime) 30 tablet 3   labetalol (NORMODYNE) 300 MG tablet TAKE 2 TABLETS THREE TIMES DAILY 540 tablet 2   letrozole (FEMARA) 2.5 MG tablet Take 1 tablet (2.5 mg total) by mouth daily. 90 tablet 3   NIFEdipine (PROCARDIA XL/NIFEDICAL XL) 60 MG 24 hr tablet Take 1 tablet (60 mg total) by mouth 2 (two) times daily. 180 tablet 3   No current facility-administered medications for this visit.   Drug-Drug  Interactions (DDIs) DDIs were evaluated?  Yes Significant DDIs?  No The patient was instructed to speak with their health care provider and/or the oral  chemotherapy pharmacist before starting any new drug, including prescription or over the counter, natural / herbal products, or vitamins.  Supportive Care Fever: reviewed the importance of having a thermometer and the Centers for Disease Control and Prevention (CDC) definition of fever which is 100.70F (38C) or higher. Patient should call 24/7 triage at [phone] if experiencing a fever or any other symptoms Myelosuppression ILD / Pneumonitis Fatigue: As above, Ms. Weigel is not interested in continuing palbociclib 100 mg.  We have sent in the 75 mg dose to [facility] outpatient pharmacy Alopecia N/V/D  Dosing Assessment Hepatic adjustments needed?  No Renal adjustments needed?  No Toxicity adjustments needed?  Yes, as above, will reduce dose due to fatigue per Ms. Blaisdell's request The current dosing regimen is not appropriate to continue at this time.  Follow-Up Plan STOP palbociclib 100 mg by mouth daily 21 out of 28-day cycle.  She started her off week today per her report.   START palbociclib 75 mg by mouth daily 21 out of 28-day cycle on 11/30/22.  New prescription has been sent to [facility] outpatient pharmacy. Continue letrozole 2.5 mg by mouth daily Continue to monitor Scr, Hgb, ANC Labs, Dr. Lindi Adie visit, on 12/07/22.  He may order restaging scans which were last done on 07/10/22 She will continue to follow with [name], NP for her other comorbidities such as hypertension She will continue to follow with Dr. Felecia Shelling from neurology.  MRI of brain has been ordered for tomorrow Will add labs, pharmacy clinic visit, on 12/28/22.  On this date she should be starting her second cycle of palbociclib at a reduced dose  Franki Cabot participated in the discussion, expressed understanding, and voiced agreement with the above plan.  All questions were answered to her satisfaction. The patient was advised to contact the clinic at [phone] with any questions or concerns prior to her return visit.   I spent 30 minutes assessing and educating the patient.  Raina Mina, RPH-CPP, 11/23/2022  2:31 PM  **Disclaimer: This note was dictated with voice recognition software. Similar sounding words can inadvertently be transcribed and this note may contain transcription errors which may not have been corrected upon publication of note.**

## 2022-11-23 NOTE — Progress Notes (Signed)
Patient presented income documentation for [employer].  Patient approved for one-time $1000 [employer] grant to assist with personal expenses while going through treatment. She has a copy of the approval letter and expense sheet along with the Outpatient pharmacy information. She received a gift card today from her grant.  She has my card for any additional financial questions or concerns.

## 2022-11-24 ENCOUNTER — Ambulatory Visit
Admission: RE | Admit: 2022-11-24 | Discharge: 2022-11-24 | Disposition: A | Discharge status: Home / Self Care | Payer: Medicare HMO | Source: Ambulatory Visit | Attending: Neurology | Admitting: Neurology

## 2022-11-24 DIAGNOSIS — I610 Nontraumatic intracerebral hemorrhage in hemisphere, subcortical: Secondary | ICD-10-CM

## 2022-11-24 DIAGNOSIS — G35 Multiple sclerosis: Secondary | ICD-10-CM

## 2022-11-25 ENCOUNTER — Telehealth: Payer: Self-pay | Admitting: Pharmacist

## 2022-11-25 NOTE — Telephone Encounter (Signed)
Scheduled appointment per 1/8 los. Patient is aware.

## 2022-11-26 ENCOUNTER — Encounter: Payer: Self-pay | Admitting: Rehabilitation

## 2022-11-26 ENCOUNTER — Ambulatory Visit: Payer: Medicare HMO | Attending: Hematology and Oncology | Admitting: Rehabilitation

## 2022-11-26 ENCOUNTER — Telehealth: Payer: Self-pay | Admitting: Pharmacy Technician

## 2022-11-26 ENCOUNTER — Other Ambulatory Visit (HOSPITAL_COMMUNITY): Payer: Self-pay

## 2022-11-26 ENCOUNTER — Encounter: Payer: Self-pay | Admitting: Hematology and Oncology

## 2022-11-26 DIAGNOSIS — Z170 Estrogen receptor positive status [ER+]: Secondary | ICD-10-CM | POA: Diagnosis not present

## 2022-11-26 DIAGNOSIS — Z483 Aftercare following surgery for neoplasm: Secondary | ICD-10-CM | POA: Diagnosis not present

## 2022-11-26 DIAGNOSIS — I890 Lymphedema, not elsewhere classified: Secondary | ICD-10-CM | POA: Diagnosis not present

## 2022-11-26 DIAGNOSIS — C50411 Malignant neoplasm of upper-outer quadrant of right female breast: Secondary | ICD-10-CM | POA: Diagnosis not present

## 2022-11-26 NOTE — Telephone Encounter (Signed)
Oral Oncology Patient Advocate Encounter  Was successful in securing patient a $15,000 grant from [employer] to provide copayment coverage for Michele Page.  This will keep the out of pocket expense at $0.     [employer] ID: [id number]  I have spoken with the patient.   The billing information is as follows and has been shared with [facility].    RxBin: [id number] PCN: [id number] Member ID: [id number] Group ID: [id number] Dates of Eligibility: 10/27/22 through 10/27/23  Fund:  [place], CPhT-Adv Oncology Pharmacy Patient [place] Direct Number: [phone]  Fax: [phone]

## 2022-11-26 NOTE — Therapy (Addendum)
 OUTPATIENT PHYSICAL THERAPY ONCOLOGY TREATMENT  Patient Name: Michele Page MRN: [id number] DOB:[birth year], 67 y.o., female Today's Date: 11/26/2022  END OF SESSION:  PT End of Session - 11/26/22 1211     Visit Number 4    Number of Visits 6    Date for PT Re-Evaluation 11/26/22    PT Start Time 1134    PT Stop Time 1208    PT Time Calculation (min) 34 min              Past Medical History:  Diagnosis Date   Anemia    past hx many yrs ago    Arthritis    lt knee   Breast cancer ([name])    Breast cancer of 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) 04/02/2016   Chronic kidney disease    CKD   Colon polyps    COPD (chronic obstructive pulmonary disease) ([name])    GERD (gastroesophageal reflux disease)    years ago- not current    Hyperlipidemia    Hypertension    Multiple sclerosis ([name])    Personal history of chemotherapy    Personal history of radiation therapy    Stroke ([name])    2013, mild cognitive deficits   Vision abnormalities    Past Surgical History:  Procedure Laterality Date   BREAST BIOPSY Left 03/31/2016   BREAST BIOPSY Right 07/24/2021   BREAST LUMPECTOMY Left 04/17/2016   BREAST LUMPECTOMY Right 09/02/2021   BREAST LUMPECTOMY WITH RADIOACTIVE SEED AND SENTINEL LYMPH NODE BIOPSY Right 09/02/2021   Procedure: RIGHT BREAST LUMPECTOMY WITH RADIOACTIVE SEED AND SENTINEL LYMPH NODE BIOPSY;  Surgeon: Harriette Bouillon, MD;  Location: [facility];  Service: General;  Laterality: Right;   COLONOSCOPY     HYSTEROSCOPY WITH D & C N/A 12/19/2015   Procedure: DILATATION AND CURETTAGE /HYSTEROSCOPY;  Surgeon: Mitchel Honour, DO;  Location: [facility];  Service: Gynecology;  Laterality: N/A;   POLYPECTOMY     PORTACATH PLACEMENT Right 10/16/2021   Procedure: INSERTION PORT-A-CATH;  Surgeon: Harriette Bouillon, MD;  Location: [facility];  Service: General;  Laterality: Right;   RADIOACTIVE SEED GUIDED PARTIAL MASTECTOMY WITH AXILLARY  SENTINEL LYMPH NODE BIOPSY Left 04/17/2016   Procedure: RADIOACTIVE SEED GUIDED PARTIAL MASTECTOMY WITH AXILLARY SENTINEL LYMPH NODE BIOPSY;  Surgeon: Harriette Bouillon, MD;  Location:  SUR[facility]ERY CENTER;  Service: General;  Laterality: Left;  RADIOACTIVE SEED GUIDED PARTIAL MASTECTOMY WITH AXILLARY SENTINEL LYMPH NODE BIOPSY    TUBAL LIGATION     VAGINAL DELIVERY     x3   Patient Active Problem List   Diagnosis Date Noted   Genetic testing 03/05/2022   Port-A-Cath in place 11/06/2021   Malignant neoplasm of upper-outer quadrant of right breast in female, estrogen receptor positive ([name]) 08/04/2021   Class 2 severe obesity due to excess calories with serious comorbidity and body mass index (BMI) of 37.0 to 37.9 in adult ([name]) 11/18/2020   Neck pain on right side 06/01/2018   History of breast cancer 11/25/2017   Breast cancer of 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) 04/02/2016   Essential hypertension 01/10/2016   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 Pamelia Hoit   REFERRING DIAG: Lymphedema   THERAPY DIAG:  Malignant neoplasm of upper-outer quadrant of right breast in female, estrogen receptor positive [facility])  Aftercare following surgery for neoplasm  Lymphedema, not elsewhere classified  ONSET DATE: 09/30/22  Rationale for Evaluation and Treatment: Rehabilitation  SUBJECTIVE:                                                                                                                                                                                            SUBJECTIVE STATEMENT:   I feel like my sleeve gets tight but maybe I don't have it on right.  I wear it about everyother day and the glove just as much as I can which is hard at work.   PERTINENT HISTORY: -History of right lumpectomy 09/02/21 with 4/6 nodes positive with chemotherapy and radiation completed, Hx of Lt lumpectomy in 2017 with 4 negative nodes removed and radiation.  Recently started [facility] 09/28/22. DVT study is negative, does show subcutaneous edema edema on dorsal aspect of right hand and distal forearm.     PAIN:  Are you having pain? No  PRECAUTIONS: bil lymphedema risk   WEIGHT BEARING RESTRICTIONS: No  FALLS:  Has patient fallen in last 6 months? 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  HAND DOMINANCE: right   PRIOR LEVEL OF FUNCTION: Independent  PATIENT GOALS: what to do for the swelling    OBJECTIVE:  COGNITION: Overall cognitive status: Within functional limits for tasks assessed   PALPATION: Non pitting, slight puffiness back of band  OBSERVATIONS / OTHER ASSESSMENTS: puffy back of hand  POSTURE: rounded shoulders, forward head  UPPER EXTREMITY AROM/PROM:  A/PROM RIGHT   eval   Shoulder extension 30-tight  Shoulder flexion 135 - pain  Shoulder abduction 160 - pain  Shoulder internal rotation   Shoulder external rotation     (Blank rows = not tested)  A/PROM LEFT   eval  Shoulder extension 70  Shoulder flexion 155  Shoulder abduction 165  Shoulder internal rotation   Shoulder external rotation     (Blank rows = not tested)  CERVICAL AROM: All within normal limits:   LYMPHEDEMA ASSESSMENTS:   LANDMARK RIGHT  eval  At axilla 33  15 cm 32  10 cm proximal to olecranon process 30.4  Olecranon process 27.8  15 cm 27.5  10 cm proximal to ulnar styloid process 23.5  Just proximal to ulnar  styloid process 18.5  Across hand at thumb web space 22  At base of 2nd digit 6.9  (Blank rows = not tested) 0.7 length  LANDMARK LEFT  eval 11/02/22 11/26/22  At axilla 33.5 33.5 32.5  15 cm 33 33 33  10 cm proximal to olecranon process 30.7 30.5   Olecranon process 26 28 27  15  cm  26.4 27.2 24.5  10 cm proximal to ulnar styloid process 23 22.8   Just proximal to ulnar styloid process 16.5 17.2 16.8  Across hand at thumb web space 22 22 22   At base of 2nd digit 6.4 7.0 6.4  (Blank rows = not tested)  1983.30ml   difference   TODAY'S TREATMENT:                                                                                                                                         DATE:  11/26/22 Reeducated pt on donning - sleeve does not seem too tight but she has applying it backwards with the seam not on the outside and it felt more comfortable after switching.  Measured pt for night garment and custom jobst relax sleeve - discussed cost with medicare being 80% with deductible met and pt would like me to submit the order and she will decide when to order.    11/11/22 Donned pts new glove which fits well.  Reviewed hand bandaging with the patient using a finger wrap and cueing as needed.  Education on self MLD today with PT reading and demonstrating all steps and then pt performing with cueing as needed.  - per instruction section for today.   Pt will wear day garments and then bandage and night and perform MLD as able.    11/02/22: Remeasured UE which demonstrates some increase in the lower arm and hand with soft pitting at the dorsum of the hand. Pt received her sleeve and glove.  The sleeve seems to fit well but the glove is too small and is the wrong color.  Remeasured hand and will move up to size large and change of colors.  Pt will also try a half finger as the full finger is over her nail bed.  Emailed Marisue Ivan and asked for rush reorder.   Discussed options again and  demonstrated bandaging for pt in clinic.  She decided that it was not as bad as she thought it would be but is still not able to come in very often due to work schedule.  She thinks her spouse would be willing to help.  Applied the following: tg soft medium inside out, fingers 1-5, artiflex from hand to axilla, 1 6cm hand bandage, 1 8cm and 1 10cm from wrist to axilla.  With vcs and instruction throughout.  Gave pt video link to MD [facility] video for "caregiver bandaging your arm".  Pt was instructed to wear this for up to 48 hours  and then remove and have husband practice as able.  Pt was instructed on bandage care, when to remove, etc. Pt will bandage as much as possible and then switch over to her new glove as able with bandaging over night if needed.    10/15/22 Discussed CDT options with patient who will not be able to bandage due to working at [employer].  Due to only minimal volume difference between sides we decided to order an exostrong sleeve and glove size medium black which was sent to [facility] as an alight order.  Pt was notified that her insurance should start covering garments in the new year.  Discussed night options which pt does not seem to need currently.  Demonstrated donning and discussed wearing only during the day.  Gave pt shoulder exercises from post op sheet as she also has limited AROM in the right shoulder  PATIENT EDUCATION:  Education details: per today's note Person educated: Patient Education method: Programmer, multimedia, Demonstration, Verbal cues, and Handouts Education comprehension: verbalized understanding and returned demonstration  HOME EXERCISE PROGRAM: Post op shoulder  ASSESSMENT:  CLINICAL IMPRESSION: Pt is doing well.  Is maintaining UE status with use of compression sleeve and glove, self bandaging, and MLD.  She will have custom sleeve and night order submitted and will order as able depending on the cost.  She will also let me know when she needs to come back. She  requires a custom flat knit sleeve due to arm sizing but can fit in a standard night garment.   OBJECTIVE IMPAIRMENTS: decreased activity tolerance, decreased knowledge of use of DME, decreased ROM, increased edema, and impaired UE functional use.   ACTIVITY LIMITATIONS: carrying, lifting, and reach over head  PARTICIPATION LIMITATIONS: meal prep, cleaning, and community activity  PERSONAL FACTORS: Age, Fitness, and 1-2 comorbidities: bil cancer status, radiation and chemo hx  are also affecting patient's functional outcome.   REHAB POTENTIAL: Good  CLINICAL DECISION MAKING: Evolving/moderate complexity  EVALUATION COMPLEXITY: Moderate  GOALS: Goals reviewed with patient? Yes  SHORT TERM GOALS/LTGs: Target date: 11/26/22  Pt will obtain compression garments for the Rt UE Baseline: Goal status: MET  2.  Pt will be ind with self MLD for the UE Baseline:  Goal status: MET  3.  Pt will be ind with exercises for the Rt shoulder to improve reach and mobility Baseline:  Goal status: Deferred   PLAN: PT FREQUENCY: 1x/week  PT DURATION: 6 weeks  PLANNED INTERVENTIONS: Therapeutic exercises, Patient/Family education, Self Care, Manual therapy, and Re-evaluation  PLAN FOR NEXT SESSION: visits as needed   Idamae Lusher, PT 11/26/2022, 12:12 PM  PHYSICAL THERAPY DISCHARGE SUMMARY 4 Visits from Start of Care: 4  Current functional level related to goals / functional outcomes: Per above   Remaining deficits: lymphedema    Education / Equipment: Self care HEP   Plan: Patient agrees to discharge.

## 2022-11-30 ENCOUNTER — Ambulatory Visit: Payer: Medicare HMO | Admitting: Hematology and Oncology

## 2022-11-30 ENCOUNTER — Other Ambulatory Visit: Payer: Medicare HMO

## 2022-12-03 ENCOUNTER — Ambulatory Visit (INDEPENDENT_AMBULATORY_CARE_PROVIDER_SITE_OTHER): Payer: Medicare HMO | Admitting: Orthopedic Surgery

## 2022-12-03 ENCOUNTER — Encounter: Payer: Self-pay | Admitting: Orthopedic Surgery

## 2022-12-03 VITALS — BP 142/88 | HR 76 | Temp 98.1°F | Resp 19 | Ht 63.0 in | Wt 199.0 lb

## 2022-12-03 DIAGNOSIS — R0982 Postnasal drip: Secondary | ICD-10-CM | POA: Diagnosis not present

## 2022-12-03 DIAGNOSIS — I10 Essential (primary) hypertension: Secondary | ICD-10-CM | POA: Diagnosis not present

## 2022-12-03 DIAGNOSIS — J449 Chronic obstructive pulmonary disease, unspecified: Secondary | ICD-10-CM | POA: Diagnosis not present

## 2022-12-03 DIAGNOSIS — G35 Multiple sclerosis: Secondary | ICD-10-CM | POA: Diagnosis not present

## 2022-12-03 NOTE — Patient Instructions (Signed)
Try Zyrtec every night x 14 days for nasal congestion  May also try vaseline inside nose to help with sores  Try humidifier every night at bedtime to help with moisture  Take blood pressures twice daily and bring to Dr. Lindi Adie  Call PCP if blood pressures are consistently > 180/90 OR you have headaches/ blurred vision that is not going away  Continue low sodium diet

## 2022-12-03 NOTE — Progress Notes (Signed)
Careteam: Patient Care Team: [name], NP as PCP - General (Geriatric Medicine) Lorretta Harp, MD as PCP - Cardiology (Cardiology) Nicholas Lose, MD as Consulting Physician (Oncology) Erroll Luna, MD as Consulting Physician (General Surgery)  Seen by: Windell Moulding, AGNP-C  PLACE OF SERVICE:  [place] Directive information    No Known Allergies  Chief Complaint  Patient presents with   Acute Visit    Blood Pressure concerns.     HPI: Patient is a 67 y.o. female seen today for acute visit due to elevated blood pressure.   Recently started on Ibrance due to malignant neoplasm of right breast. She has noticed increased blood pressures at home since starting medication. She did not being bp readings with her today. Pressures averaging < 150/90. Denies chest pain, sob, headaches or blurred vision. She is following low sodium diet. She has lost 5 lbs in 1 month. Remains on labetalol and nifedipine.   Increased clear nasal congestion and sores to left septum. Denies pain or nose bleeds.   Review of Systems:  Review of Systems  Constitutional:  Negative for chills and fever.  Respiratory:  Negative for cough, shortness of breath and wheezing.   Cardiovascular:  Negative for chest pain and leg swelling.  Neurological:  Negative for dizziness and headaches.  Endo/Heme/Allergies:  Positive for environmental allergies.  Psychiatric/Behavioral:  Negative for depression. The patient is not nervous/anxious.     Past Medical History:  Diagnosis Date   Anemia    past hx many yrs ago    Arthritis    lt knee   Breast cancer ([place])    Breast cancer of upper-outer quadrant of left female breast ([place]) 04/02/2016   Chronic kidney disease    CKD   Colon polyps    COPD (chronic obstructive pulmonary disease) ([name])    GERD (gastroesophageal reflux disease)    years ago- not current    Hyperlipidemia    Hypertension    Multiple sclerosis ([place])    Personal  history of chemotherapy    Personal history of radiation therapy    Stroke ([place])    2013, mild cognitive deficits   Vision abnormalities    Past Surgical History:  Procedure Laterality Date   BREAST BIOPSY Left 03/31/2016   BREAST BIOPSY Right 07/24/2021   BREAST LUMPECTOMY Left 04/17/2016   BREAST LUMPECTOMY Right 09/02/2021   BREAST LUMPECTOMY WITH RADIOACTIVE SEED AND SENTINEL LYMPH NODE BIOPSY Right 09/02/2021   Procedure: RIGHT BREAST LUMPECTOMY WITH RADIOACTIVE SEED AND SENTINEL LYMPH NODE BIOPSY;  Surgeon: Erroll Luna, MD;  Location: [facility];  Service: General;  Laterality: Right;   COLONOSCOPY     HYSTEROSCOPY WITH D & C N/A 12/19/2015   Procedure: DILATATION AND CURETTAGE /HYSTEROSCOPY;  Surgeon: Linda Hedges, DO;  Location: [facility];  Service: Gynecology;  Laterality: N/A;   POLYPECTOMY     PORTACATH PLACEMENT Right 10/16/2021   Procedure: INSERTION PORT-A-CATH;  Surgeon: Erroll Luna, MD;  Location: [facility];  Service: General;  Laterality: Right;   RADIOACTIVE SEED GUIDED PARTIAL MASTECTOMY WITH AXILLARY SENTINEL LYMPH NODE BIOPSY Left 04/17/2016   Procedure: RADIOACTIVE SEED GUIDED PARTIAL MASTECTOMY WITH AXILLARY SENTINEL LYMPH NODE BIOPSY;  Surgeon: Erroll Luna, MD;  Location: [facility];  Service: General;  Laterality: Left;  RADIOACTIVE SEED GUIDED PARTIAL MASTECTOMY WITH AXILLARY SENTINEL LYMPH NODE BIOPSY    TUBAL LIGATION     VAGINAL DELIVERY     x3  Social History:   reports that she quit smoking about 18 years ago. Her smoking use included cigarettes. She has a 12.50 pack-year smoking history. She has never used smokeless tobacco. She reports that she does not currently use alcohol. She reports that she does not use drugs.  Family History  Problem Relation Age of Onset   Hypertension Mother    Stroke Mother    Stroke Sister    Alcohol abuse Brother    HIV/AIDS Brother    Breast cancer Cousin         dx 56s   Colon cancer Neg Hx    Colon polyps Neg Hx    Esophageal cancer Neg Hx    Rectal cancer Neg Hx    Stomach cancer Neg Hx     Medications: Patient's Medications  New Prescriptions   No medications on file  Previous Medications   ASPIRIN EC 81 MG TABLET    Take 1 tablet (81 mg total) by mouth daily.   ATORVASTATIN (LIPITOR) 20 MG TABLET    Take 1 tablet (20 mg total) by mouth daily.   ESZOPICLONE (LUNESTA) 2 MG TABS TABLET    Take one tablet by mouth at bedtime as needed for sleep (take immediately before bedtime)   LABETALOL (NORMODYNE) 300 MG TABLET    TAKE 2 TABLETS THREE TIMES DAILY   LETROZOLE (FEMARA) 2.5 MG TABLET    Take 1 tablet (2.5 mg total) by mouth daily.   NIFEDIPINE (PROCARDIA XL/NIFEDICAL XL) 60 MG 24 HR TABLET    Take 1 tablet (60 mg total) by mouth 2 (two) times daily.   PALBOCICLIB (IBRANCE) 75 MG TABLET    Take 1 tablet (75 mg total) by mouth daily. Take for 21 days on, 7 days off, repeat every 28 days.  Modified Medications   No medications on file  Discontinued Medications   No medications on file    Physical Exam:  Vitals:   12/03/22 1400  BP: (!) 142/88  Pulse: 76  Resp: 19  Temp: 98.1 F (36.7 C)  SpO2: 99%  Weight: 199 lb (90.3 kg)  Height: '5\' 3"'$  (1.6 m)   Body mass index is 35.25 kg/m. Wt Readings from Last 3 Encounters:  12/03/22 199 lb (90.3 kg)  11/23/22 202 lb 4.8 oz (91.8 kg)  11/10/22 205 lb 11.2 oz (93.3 kg)    Physical Exam Vitals reviewed.  Constitutional:      General: She is not in acute distress. HENT:     Head: Normocephalic.  Eyes:     General:        Right eye: No discharge.        Left eye: No discharge.  Neck:     Vascular: No carotid bruit.     Comments: No JVD  Cardiovascular:     Rate and Rhythm: Normal rate and regular rhythm.     Pulses: Normal pulses.     Heart sounds: Normal heart sounds.  Pulmonary:     Effort: Pulmonary effort is normal. No respiratory distress.     Breath sounds: Normal  breath sounds. No wheezing.  Abdominal:     General: Bowel sounds are normal.     Palpations: Abdomen is soft.  Musculoskeletal:     Cervical back: Neck supple.     Right lower leg: No edema.     Left lower leg: No edema.  Skin:    General: Skin is warm and dry.     Capillary Refill: Capillary refill takes  less than 2 seconds.  Neurological:     General: No focal deficit present.     Mental Status: She is alert and oriented to person, place, and time.  Psychiatric:        Mood and Affect: Mood normal.        Behavior: Behavior normal.     Labs reviewed: Basic Metabolic Panel: Recent Labs    10/21/22 0939 11/10/22 1216 11/23/22 1245  NA 141 142 140  K 4.2 3.5 3.6  CL 107 108 106  CO2 '29 24 25  '$ GLUCOSE 114* 147* 132*  BUN '18 19 18  '$ CREATININE 1.70* 1.76* 1.82*  CALCIUM 10.4* 9.8 9.8   Liver Function Tests: Recent Labs    10/21/22 0939 11/10/22 1216 11/23/22 1245  AST '20 25 25  '$ ALT '19 16 17  '$ ALKPHOS 90 90 75  BILITOT 0.4 0.4 0.4  PROT 7.1 7.3 6.8  ALBUMIN 4.5 4.6 4.7   No results for input(s): "LIPASE", "AMYLASE" in the last 8760 hours. No results for input(s): "AMMONIA" in the last 8760 hours. CBC: Recent Labs    10/21/22 0939 11/10/22 1216 11/23/22 1245  WBC 2.1* 2.8* 2.7*  NEUTROABS 1.2* 1.8 1.6*  HGB 11.6* 11.4* 11.2*  HCT 33.9* 32.9* 31.4*  MCV 94.7 94.5 94.6  PLT 215 336 226   Lipid Panel: Recent Labs    06/05/22 1329  CHOL 161  HDL 69  LDLCALC 78  TRIG 68  CHOLHDL 2.3   TSH: No results for input(s): "TSH" in the last 8760 hours. A1C: Lab Results  Component Value Date   HGBA1C 5.8 (H) 05/13/2020     Assessment/Plan 1. Essential hypertension - controlled, goal < 140/90 - blood pressure 142/88 today - BUN/creat 18/1.82 11/23/2021 - worried [name] is raising pressures - did not bring pressures today - cont labetalol and nifedipine - recommend taking pressures BID x 1 week> bring to Dr. Lindi Adie to discuss - cont low sodium  diet - notify PCP if SBP > 180 or headaches/blurred vision to not resolve  2. Post-nasal drip - left turbinate swollen, small sore noted - clear nasal drainage - recommend trying humidifier - may apply Vaseline to inner nose - recommend trial Zyrtec qhs x 14 days to help with nasal congestion  Total time: 22 minutes. Greater than 50% of total time spent doing patient education regarding HTN and post nasal drip including symptom/medication management.     Next appt: Visit date not found  [place], [facility] [phone]

## 2022-12-07 ENCOUNTER — Other Ambulatory Visit: Payer: Self-pay

## 2022-12-07 ENCOUNTER — Inpatient Hospital Stay (HOSPITAL_BASED_OUTPATIENT_CLINIC_OR_DEPARTMENT_OTHER): Payer: Medicare HMO | Admitting: Hematology and Oncology

## 2022-12-07 ENCOUNTER — Inpatient Hospital Stay: Payer: Medicare HMO

## 2022-12-07 VITALS — BP 140/79 | HR 86 | Temp 97.9°F | Resp 18 | Ht 63.0 in | Wt 198.8 lb

## 2022-12-07 DIAGNOSIS — C50411 Malignant neoplasm of upper-outer quadrant of right female breast: Secondary | ICD-10-CM

## 2022-12-07 DIAGNOSIS — Z170 Estrogen receptor positive status [ER+]: Secondary | ICD-10-CM | POA: Diagnosis not present

## 2022-12-07 DIAGNOSIS — Z79811 Long term (current) use of aromatase inhibitors: Secondary | ICD-10-CM | POA: Diagnosis not present

## 2022-12-07 DIAGNOSIS — C50412 Malignant neoplasm of upper-outer quadrant of left female breast: Secondary | ICD-10-CM | POA: Diagnosis not present

## 2022-12-07 LAB — CBC WITH DIFFERENTIAL (CANCER CENTER ONLY)
Abs Immature Granulocytes: 0.01 10*3/uL (ref 0.00–0.07)
Basophils Absolute: 0.1 10*3/uL (ref 0.0–0.1)
Basophils Relative: 2 %
Eosinophils Absolute: 0.1 10*3/uL (ref 0.0–0.5)
Eosinophils Relative: 1 %
HCT: 32.2 % — ABNORMAL LOW (ref 36.0–46.0)
Hemoglobin: 11.3 g/dL — ABNORMAL LOW (ref 12.0–15.0)
Immature Granulocytes: 0 %
Lymphocytes Relative: 22 %
Lymphs Abs: 0.9 10*3/uL (ref 0.7–4.0)
MCH: 33.4 pg (ref 26.0–34.0)
MCHC: 35.1 g/dL (ref 30.0–36.0)
MCV: 95.3 fL (ref 80.0–100.0)
Monocytes Absolute: 0.2 10*3/uL (ref 0.1–1.0)
Monocytes Relative: 6 %
Neutro Abs: 2.8 10*3/uL (ref 1.7–7.7)
Neutrophils Relative %: 69 %
Platelet Count: 320 10*3/uL (ref 150–400)
RBC: 3.38 MIL/uL — ABNORMAL LOW (ref 3.87–5.11)
RDW: 15 % (ref 11.5–15.5)
WBC Count: 4 10*3/uL (ref 4.0–10.5)
nRBC: 0 % (ref 0.0–0.2)

## 2022-12-07 LAB — CMP (CANCER CENTER ONLY)
ALT: 17 U/L (ref 0–44)
AST: 25 U/L (ref 15–41)
Albumin: 4.4 g/dL (ref 3.5–5.0)
Alkaline Phosphatase: 74 U/L (ref 38–126)
Anion gap: 9 (ref 5–15)
BUN: 20 mg/dL (ref 8–23)
CO2: 25 mmol/L (ref 22–32)
Calcium: 9.5 mg/dL (ref 8.9–10.3)
Chloride: 108 mmol/L (ref 98–111)
Creatinine: 1.78 mg/dL — ABNORMAL HIGH (ref 0.44–1.00)
GFR, Estimated: 31 mL/min — ABNORMAL LOW (ref >60)
Glucose, Bld: 107 mg/dL — ABNORMAL HIGH (ref 70–99)
Potassium: 3.4 mmol/L — ABNORMAL LOW (ref 3.5–5.1)
Sodium: 142 mmol/L (ref 135–145)
Total Bilirubin: 0.5 mg/dL (ref 0.3–1.2)
Total Protein: 6.8 g/dL (ref 6.5–8.1)

## 2022-12-07 NOTE — Assessment & Plan Note (Addendum)
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 systemic chemotherapy with CMF x6 cycles completed 01/30/2022 2. adjuvant radiation therapy completing 04/09/2022 3.  For the continued antiestrogen therapy (previously anastrozole started 07/23/2016 switched to letrozole 2018 switched to tamoxifen 2018, switched back to anastrozole) hair loss was the main problem, may consider exemestane ------------------------------------------------------------------------------------------------------------------------------- Current treatment: Letrozole (because Signatera testing came back positive).  Patient could not tolerate Verzinio and she discontinued it.   07/10/2022: CT CAP: No evidence of metastatic disease, tiny pulmonary nodules nonspecific heterogeneous 2.2 cm left thyroid nodule   Thyroid nodule: thyroid ultrasound 08/14/2022: 2.6 cm nodule.  Biopsy 27/04/2375: Benign follicular nodule   Signatera:  06/12/2022: Positive (0.52) 09/02/2022: Positive (68.11) CT CAP 07/10/2022: No metastatic disease.  Tiny pulmonary nodules nonspecific. The rise in the [place] testing is concerning for progression of disease.   Recommend adding Ibrance to letrozole.  09/18/2022 I sent a prescription of 100 mg Ibrance 21 days on 7 days off.  Ibrance toxicities: Tolerating 70 mg dose extremely well.  At the 100 mg dose she became very  jittery as well as leukopenic.  Return to clinic in 1 month to see John.   After that we will see her every 3 months alternating between John and myself.  We will be waiting for Signatera test results.

## 2022-12-07 NOTE — Progress Notes (Signed)
Patient Care Team: [name], NP as PCP - General (Geriatric Medicine) Lorretta Harp, MD as PCP - Cardiology (Cardiology) Nicholas Lose, MD as Consulting Physician (Oncology) Erroll Luna, MD as Consulting Physician (General Surgery)  DIAGNOSIS:  Encounter Diagnosis  Name Primary?   Malignant neoplasm of upper-outer quadrant of right breast in female, estrogen receptor positive ([place]) Yes    SUMMARY OF ONCOLOGIC HISTORY: Oncology History  Breast cancer of upper-outer quadrant of left female breast ([place])  03/31/2016 Initial Diagnosis   Screening detected left breast mass, 2 nodules, 1.9 x 1.6 x 0.8 cm= fat necrosis; 8 x 7 x 7 mm= grade 2 IDC ER 100%, PR 100%, HER-2 negative ratio 1.39, Ki-67 20%   04/17/2016 Surgery   Left lumpectomy ([name]): IDC grade 2, 1.3 cm, with DCIS, margins negative, 0/4 lymph nodes negative, T1 cN0 stage IA pathologic stage, ER 100%, PR 100%, HER-2 negative ratio 1.39, Ki-67 20% Oncotype DX score 20, 13% ROR, intermediate risk   04/17/2016 Oncotype testing   Recurrence score: 20; ROR 15% (intermediate risk)    05/27/2016 - 07/13/2016 Radiation Therapy   Adjuvant radiation therapy [facility]): Left breast treated with breath hold to 50.4 Gy in 28 fractions at 1.8 Gy/fraction.  Left breast boosted to 10 Gy in 5 fractions at 2 Gy/fraction   07/23/2016 -  Anti-estrogen oral therapy   Anastrozole 1 mg switched to letrozole 04/22/2017 due to hair loss, switch to tamoxifen 10/22/2017 due to hair loss from letrozole as well   Malignant neoplasm of upper-outer quadrant of right breast in female, estrogen receptor positive ([place])  08/04/2021 Initial Diagnosis   Malignant neoplasm of upper-outer quadrant of right breast in female, estrogen receptor positive ([name])    Relapse/Recurrence   2.2 cm ILC ER 100%, PR 90%, Her 2 Neg, Ki 67: 25%, Size 2.5 cm   08/04/2021 Cancer Staging   Staging form: Breast, AJCC 8th Edition - Clinical: Stage IB (cT2, cN0, cM0, G2,  ER+, PR+, HER2-) - Signed by Nicholas Lose, MD on 08/04/2021 Stage prefix: Initial diagnosis Histologic grading system: 3 grade system   09/02/2021 Surgery   Right lumpectomy: Grade 2 ILC 3.1 cm, margins negative, LVI present, 4/6 lymph nodes macrometastases, 2 lymph nodes isolated tumor cells, ER 100%, PR 90%, Ki-67 25%, HER2 negative   10/17/2021 - 01/30/2022 Chemotherapy   Patient is on Treatment Plan : BREAST Adjuvant CMF IV q21d      Genetic Testing   Negative genetic testing. No pathogenic variants identified on the [employer] CustomNext+RNA panel. The report date is 03/04/2022.  The CustomNext-Cancer+RNAinsight panel offered by [facility] includes sequencing and rearrangement analysis for the following 47 genes:  APC, ATM, AXIN2, BARD1, BMPR1A, BRCA1, BRCA2, BRIP1, CDH1, CDK4, CDKN2A, CHEK2, DICER1, EPCAM, GREM1, HOXB13, MEN1, MLH1, MSH2, MSH3, MSH6, MUTYH, NBN, NF1, NF2, NTHL1, PALB2, PMS2, POLD1, POLE, PTEN, RAD51C, RAD51D, RECQL, RET, SDHA, SDHAF2, SDHB, SDHC, SDHD, SMAD4, SMARCA4, STK11, TP53, TSC1, TSC2, and VHL.  RNA data is routinely analyzed for use in variant interpretation for all genes.     CHIEF COMPLIANT: Follow-up on Ibrance   INTERVAL HISTORY: Michele Page is a 67 y.o with right lumpectomy on Ibrance. She states that she can tolerate the Ibrance. Denies any fatigue.  ALLERGIES:  has No Known Allergies.  MEDICATIONS:  Current Outpatient Medications  Medication Sig Dispense Refill   aspirin EC 81 MG tablet Take 1 tablet (81 mg total) by mouth daily. 30 tablet 6   atorvastatin (LIPITOR) 20 MG tablet  Take 1 tablet (20 mg total) by mouth daily. 90 tablet 1   eszopiclone (LUNESTA) 2 MG TABS tablet Take one tablet by mouth at bedtime as needed for sleep (take immediately before bedtime) 30 tablet 3   labetalol (NORMODYNE) 300 MG tablet TAKE 2 TABLETS THREE TIMES DAILY 540 tablet 2   letrozole (FEMARA) 2.5 MG tablet Take 1 tablet (2.5 mg total) by mouth daily. 90 tablet 3    NIFEdipine (PROCARDIA XL/NIFEDICAL XL) 60 MG 24 hr tablet Take 1 tablet (60 mg total) by mouth 2 (two) times daily. 180 tablet 3   palbociclib (IBRANCE) 75 MG tablet Take 1 tablet (75 mg total) by mouth daily. Take for 21 days on, 7 days off, repeat every 28 days. 21 tablet 3   No current facility-administered medications for this visit.    PHYSICAL EXAMINATION: ECOG PERFORMANCE STATUS: 1 - Symptomatic but completely ambulatory  Vitals:   12/07/22 1346  BP: (!) 140/79  Pulse: 86  Resp: 18  Temp: 97.9 F (36.6 C)  SpO2: 98%   Filed Weights   12/07/22 1346  Weight: 198 lb 12.8 oz (90.2 kg)      LABORATORY DATA:  I have reviewed the data as listed    Latest Ref Rng & Units 12/07/2022   12:15 PM 11/23/2022   12:45 PM 11/10/2022   12:16 PM  CMP  Glucose 70 - 99 mg/dL 107  132  147   BUN 8 - 23 mg/dL '20  18  19   '$ Creatinine 0.44 - 1.00 mg/dL 1.78  1.82  1.76   Sodium 135 - 145 mmol/L 142  140  142   Potassium 3.5 - 5.1 mmol/L 3.4  3.6  3.5   Chloride 98 - 111 mmol/L 108  106  108   CO2 22 - 32 mmol/L '25  25  24   '$ Calcium 8.9 - 10.3 mg/dL 9.5  9.8  9.8   Total Protein 6.5 - 8.1 g/dL 6.8  6.8  7.3   Total Bilirubin 0.3 - 1.2 mg/dL 0.5  0.4  0.4   Alkaline Phos 38 - 126 U/L 74  75  90   AST 15 - 41 U/L '25  25  25   '$ ALT 0 - 44 U/L '17  17  16     '$ Lab Results  Component Value Date   WBC 4.0 12/07/2022   HGB 11.3 (L) 12/07/2022   HCT 32.2 (L) 12/07/2022   MCV 95.3 12/07/2022   PLT 320 12/07/2022   NEUTROABS 2.8 12/07/2022    ASSESSMENT & PLAN:  Malignant neoplasm of upper-outer quadrant of right breast in female, estrogen receptor positive ([place]) 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, 1.3 cm, with DCIS, margins negative, 0/4 lymph nodes negative, T1 cN0 stage IA pathologic stage, ER 100%, PR 100%, HER-2 negative ratio 1.39, Ki-67 20% (Originally 2 nodules were detected on screening mammogram 1.9 cm= fat necrosis and 8 mm IDC) Oncotype DX score 20, 13% risk of recurrence,  intermediate risk Adjuvant radiation therapy started 07/20/2017completed 07/13/2016     09/02/2021:Right lumpectomy: Grade 2 ILC 3.1 cm, margins negative, LVI present, 4/6 lymph nodes macrometastases, 2 lymph nodes isolated tumor cells, ER 100%, PR 90%, Ki-67 25%, HER2 negative   Treatment plan/summary: 1. systemic chemotherapy with CMF x6 cycles completed 01/30/2022 2. adjuvant radiation therapy completing 04/09/2022 3.  For the continued antiestrogen therapy (previously anastrozole started 07/23/2016 switched to letrozole 2018 switched to tamoxifen 2018, switched back to anastrozole) hair loss was the  main problem, may consider exemestane ------------------------------------------------------------------------------------------------------------------------------- Current treatment: Letrozole (because Signatera testing came back positive).  Patient could not tolerate Verzinio and she discontinued it.   07/10/2022: CT CAP: No evidence of metastatic disease, tiny pulmonary nodules nonspecific heterogeneous 2.2 cm left thyroid nodule   Thyroid nodule: thyroid ultrasound 08/14/2022: 2.6 cm nodule.  Biopsy 82/50/5397: Benign follicular nodule   Signatera:  06/12/2022: Positive (0.52) 09/02/2022: Positive (68.11) CT CAP 07/10/2022: No metastatic disease.  Tiny pulmonary nodules nonspecific. The rise in the [place] testing is concerning for progression of disease.   Recommend adding Ibrance to letrozole.  09/18/2022 I sent a prescription of 100 mg Ibrance 21 days on 7 days off.  Ibrance toxicities: Tolerating 70 mg dose extremely well.  At the 100 mg dose she became very jittery as well as leukopenic.  Return to clinic in 1 month to see John.   After that we will see her every 3 months alternating between John and myself.  We will be waiting for Signatera test results.    No orders of the defined types were placed in this encounter.  The patient has a good understanding of the overall plan.  she agrees with it. she will call with any problems that may develop before the next visit here. Total time spent: 30 mins including face to face time and time spent for planning, charting and co-ordination of care   Harriette Ohara, MD 12/07/22    I Gardiner Coins am acting as a Education administrator for [employer]  I have reviewed the above documentation for accuracy and completeness, and I agree with the above.

## 2022-12-08 ENCOUNTER — Telehealth: Payer: Self-pay | Admitting: Hematology and Oncology

## 2022-12-08 NOTE — Telephone Encounter (Signed)
Scheduled appointment per 1/22 los. Patient is aware.

## 2022-12-18 ENCOUNTER — Other Ambulatory Visit (HOSPITAL_COMMUNITY): Payer: Self-pay

## 2022-12-21 ENCOUNTER — Other Ambulatory Visit: Payer: Self-pay

## 2022-12-22 ENCOUNTER — Ambulatory Visit: Payer: Medicare HMO | Admitting: Podiatry

## 2022-12-22 VITALS — BP 164/87 | HR 86 | Temp 97.9°F

## 2022-12-22 DIAGNOSIS — L600 Ingrowing nail: Secondary | ICD-10-CM | POA: Diagnosis not present

## 2022-12-22 NOTE — Progress Notes (Signed)
Subjective:  Patient ID: Michele Page, female    DOB: 05-Feb-1956,  MRN: [id number]  Chief Complaint  Patient presents with   Ingrown Toenail    Left great toenail pain, medial border. Patient states it has been in pain about two months.     67 y.o. female presents with the above complaint.  Patient presents with left hallux medial border ingrown pain for touch is progressive gotten worse worse with ambulation worse with pressure.  Patient states been going on for about 2 months.  She would like to have it removed she has not seen anyone else prior to seeing me.  Pain scale 7 out of 10 dull achy in nature   Review of Systems: Negative except as noted in the HPI. Denies N/V/F/Ch.  Past Medical History:  Diagnosis Date   Anemia    past hx many yrs ago    Arthritis    lt knee   Breast cancer ([place])    Breast cancer of upper-outer quadrant of left female breast ([place]) 04/02/2016   Chronic kidney disease    CKD   Colon polyps    COPD (chronic obstructive pulmonary disease) ([name])    GERD (gastroesophageal reflux disease)    years ago- not current    Hyperlipidemia    Hypertension    Multiple sclerosis ([place])    Personal history of chemotherapy    Personal history of radiation therapy    Stroke ([place])    2013, mild cognitive deficits   Vision abnormalities     Current Outpatient Medications:    aspirin EC 81 MG tablet, Take 1 tablet (81 mg total) by mouth daily., Disp: 30 tablet, Rfl: 6   atorvastatin (LIPITOR) 20 MG tablet, Take 1 tablet (20 mg total) by mouth daily., Disp: 90 tablet, Rfl: 1   eszopiclone (LUNESTA) 2 MG TABS tablet, Take one tablet by mouth at bedtime as needed for sleep (take immediately before bedtime), Disp: 30 tablet, Rfl: 3   labetalol (NORMODYNE) 300 MG tablet, TAKE 2 TABLETS THREE TIMES DAILY, Disp: 540 tablet, Rfl: 2   letrozole (FEMARA) 2.5 MG tablet, Take 1 tablet (2.5 mg total) by mouth daily., Disp: 90 tablet, Rfl: 3   NIFEdipine (PROCARDIA XL/NIFEDICAL  XL) 60 MG 24 hr tablet, Take 1 tablet (60 mg total) by mouth 2 (two) times daily., Disp: 180 tablet, Rfl: 3   palbociclib (IBRANCE) 75 MG tablet, Take 1 tablet (75 mg total) by mouth daily. Take for 21 days on, 7 days off, repeat every 28 days., Disp: 21 tablet, Rfl: 3  Social History   Tobacco Use  Smoking Status Former   Packs/day: 0.50   Years: 25.00   Total pack years: 12.50   Types: Cigarettes   Quit date: 11/16/2004   Years since quitting: 18.1  Smokeless Tobacco Never    No Known Allergies Objective:   Vitals:   12/22/22 1255  BP: (!) 164/87  Pulse: 86  Temp: 97.9 F (36.6 C)  SpO2: 95%   There is no height or weight on file to calculate BMI. Constitutional Well developed. Well nourished.  Vascular Dorsalis pedis pulses palpable bilaterally. Posterior tibial pulses palpable bilaterally. Capillary refill normal to all digits.  No cyanosis or clubbing noted. Pedal hair growth normal.  Neurologic Normal speech. Oriented to person, place, and time. Epicritic sensation to light touch grossly present bilaterally.  Dermatologic Painful ingrowing nail at medial nail borders of the hallux nail left. No other open wounds. No skin lesions.  Orthopedic:  Normal joint ROM without pain or crepitus bilaterally. No visible deformities. No bony tenderness.   Radiographs: None Assessment:   1. Ingrown left big toenail    Plan:  Patient was evaluated and treated and all questions answered.  Ingrown Nail, left -Patient elects to proceed with minor surgery to remove ingrown toenail removal today. Consent reviewed and signed by patient. -Ingrown nail excised. See procedure note. -Educated on post-procedure care including soaking. Written instructions provided and reviewed. -Patient to follow up in 2 weeks for nail check.  Procedure: Excision of Ingrown Toenail Location: Left 1st toe medial nail borders. Anesthesia: Lidocaine 1% plain; 1.5 mL and Marcaine 0.5% plain; 1.5 mL,  digital block. Skin Prep: Betadine. Dressing: Silvadene; telfa; dry, sterile, compression dressing. Technique: Following skin prep, the toe was exsanguinated and a tourniquet was secured at the base of the toe. The affected nail border was freed, split with a nail splitter, and excised. Chemical matrixectomy was then performed with phenol and irrigated out with alcohol. The tourniquet was then removed and sterile dressing applied. Disposition: Patient tolerated procedure well. Patient to return in 2 weeks for follow-up.   No follow-ups on file.

## 2022-12-23 ENCOUNTER — Other Ambulatory Visit: Payer: Self-pay

## 2022-12-28 ENCOUNTER — Inpatient Hospital Stay: Payer: Medicare HMO | Admitting: Pharmacist

## 2022-12-28 ENCOUNTER — Inpatient Hospital Stay: Payer: Medicare HMO | Attending: Hematology and Oncology

## 2022-12-28 VITALS — BP 153/78 | HR 91 | Temp 97.9°F | Resp 18 | Ht 63.0 in | Wt 200.1 lb

## 2022-12-28 DIAGNOSIS — C50411 Malignant neoplasm of upper-outer quadrant of right female breast: Secondary | ICD-10-CM | POA: Insufficient documentation

## 2022-12-28 DIAGNOSIS — Z79811 Long term (current) use of aromatase inhibitors: Secondary | ICD-10-CM | POA: Insufficient documentation

## 2022-12-28 DIAGNOSIS — Z170 Estrogen receptor positive status [ER+]: Secondary | ICD-10-CM

## 2022-12-28 LAB — CBC WITH DIFFERENTIAL (CANCER CENTER ONLY)
Abs Immature Granulocytes: 0.01 10*3/uL (ref 0.00–0.07)
Basophils Absolute: 0.1 10*3/uL (ref 0.0–0.1)
Basophils Relative: 2 %
Eosinophils Absolute: 0 10*3/uL (ref 0.0–0.5)
Eosinophils Relative: 1 %
HCT: 30.8 % — ABNORMAL LOW (ref 36.0–46.0)
Hemoglobin: 10.7 g/dL — ABNORMAL LOW (ref 12.0–15.0)
Immature Granulocytes: 0 %
Lymphocytes Relative: 30 %
Lymphs Abs: 1 10*3/uL (ref 0.7–4.0)
MCH: 33.8 pg (ref 26.0–34.0)
MCHC: 34.7 g/dL (ref 30.0–36.0)
MCV: 97.2 fL (ref 80.0–100.0)
Monocytes Absolute: 0.4 10*3/uL (ref 0.1–1.0)
Monocytes Relative: 13 %
Neutro Abs: 1.7 10*3/uL (ref 1.7–7.7)
Neutrophils Relative %: 54 %
Platelet Count: 216 10*3/uL (ref 150–400)
RBC: 3.17 MIL/uL — ABNORMAL LOW (ref 3.87–5.11)
RDW: 14.9 % (ref 11.5–15.5)
WBC Count: 3.2 10*3/uL — ABNORMAL LOW (ref 4.0–10.5)
nRBC: 0 % (ref 0.0–0.2)

## 2022-12-28 LAB — CMP (CANCER CENTER ONLY)
ALT: 15 U/L (ref 0–44)
AST: 21 U/L (ref 15–41)
Albumin: 4.5 g/dL (ref 3.5–5.0)
Alkaline Phosphatase: 93 U/L (ref 38–126)
Anion gap: 8 (ref 5–15)
BUN: 23 mg/dL (ref 8–23)
CO2: 26 mmol/L (ref 22–32)
Calcium: 9.8 mg/dL (ref 8.9–10.3)
Chloride: 106 mmol/L (ref 98–111)
Creatinine: 1.89 mg/dL — ABNORMAL HIGH (ref 0.44–1.00)
GFR, Estimated: 29 mL/min — ABNORMAL LOW (ref >60)
Glucose, Bld: 112 mg/dL — ABNORMAL HIGH (ref 70–99)
Potassium: 3.9 mmol/L (ref 3.5–5.1)
Sodium: 140 mmol/L (ref 135–145)
Total Bilirubin: 0.4 mg/dL (ref 0.3–1.2)
Total Protein: 7.2 g/dL (ref 6.5–8.1)

## 2022-12-28 NOTE — Progress Notes (Signed)
[facility]       Telephone: [phone]?Fax: [phone]   Oncology Clinical Pharmacist Practitioner Progress Note  Michele Page was contacted via in-person visit to discuss her chemotherapy regimen for palbociclib which they receive under the care of Dr. Nicholas Lose.    Current treatment regimen and start date Palbociclib (09/28/22) 75 mg dose started approximately 4 weeks ago 100 mg dose started 09/28/22  Letrozole (06/05/22)   Interval History She continues on palbociclib 75 mg by mouth  daily on days 1 to 21 of a 28-day cycle. This is being given  in combination with letrozole . Therapy is planned to continue until disease progression or unacceptable toxicity.  Michele Page was seen today by clinical pharmacy as a follow-up to her palbociclib management.  She was last seen by clinical pharmacy on 11/23/22 and Dr. Lindi Adie on 12/07/22.  At his last visit, Dr. Lindi Adie stated that Michele Page can be seen every 3 months after our visit today. Michele Page already has a lab and Dr. Lindi Adie visit scheduled for 03/02/23 and prefers to keep this appointment. We will see her back 3 months after that visit. She states Dr. Felecia Shelling from neurology requires no follow-up at this time and she continues to see [name] for her other comorbidities. She sees her next on 02/04/23.  Response to Therapy Overall, Michele Page is tolerating the 75 mg dose of palbociclib much better. She is having so side effects that would be  attributed to palbociclib. She has known CKD and her Scr continues to be elevated. Baseline value estimated at 1.52 on 07/06/22.  Her main concern today continues to be her blood pressure which we again discussed is not likely due to palbociclib. She had these same concerns with abemaciclib. She would like clinical pharmacy to send our visit note summary from today to [name] NP office which we will do today and copy Dr. Lindi Adie. She states her numbers at home which she checks 5-6  times a day range from SBP 120-150s and DBP 75-80s. Although [name] will be seeing her on 02/04/23, Michele Page preferred [place] to send this information today in case the visit should be moved up. Michele Page states she takes her labetalol every 4 hours and her nifedipine every 12 hours.  From a palbociclib perspective, Dr. Lindi Adie is comfortable seeing her every 3 months going forward. Last scans were done August 2023. Labs, vitals, treatment parameters, and manufacturer guidelines assessing toxicity were reviewed with Michele Page today. Based on these values, patient is in agreement to continue palbociclib therapy at this time.  Allergies No Known Allergies  Vitals    12/28/2022   12:53 PM 12/22/2022   12:55 PM 12/07/2022    1:46 PM  Oncology Vitals  Height 160 cm  160 cm  Weight 90.765 kg  90.175 kg  Weight (lbs) 200 lbs 2 oz  198 lbs 13 oz  BMI 35.45 kg/m2   35.45 kg/m2  35.22 kg/m2   35.22 kg/m2  Temp 97.9 F (36.6 C) 97.9 F (36.6 C) 97.9 F (36.6 C)  Pulse Rate 91 86 86  BP 153/78 164/87 140/79  Resp 18  18  SpO2 98 % 95 % 98 %  BSA (m2) 2.01 m2   2.01 m2  2 m2   2 m2     Laboratory Data    Latest Ref Rng & Units 12/28/2022   11:53 AM 12/07/2022   12:15 PM 11/23/2022  12:45 PM  CBC EXTENDED  WBC 4.0 - 10.5 K/uL 3.2  4.0  2.7   RBC 3.87 - 5.11 MIL/uL 3.17  3.38  3.32   Hemoglobin 12.0 - 15.0 g/dL 10.7  11.3  11.2   HCT 36.0 - 46.0 % 30.8  32.2  31.4   Platelets 150 - 400 K/uL 216  320  226   NEUT# 1.7 - 7.7 K/uL 1.7  2.8  1.6   Lymph# 0.7 - 4.0 K/uL 1.0  0.9  0.9        Latest Ref Rng & Units 12/28/2022   11:53 AM 12/07/2022   12:15 PM 11/23/2022   12:45 PM  CMP  Glucose 70 - 99 mg/dL 112  107  132   BUN 8 - 23 mg/dL 23  20  18   $ Creatinine 0.44 - 1.00 mg/dL 1.89  1.78  1.82   Sodium 135 - 145 mmol/L 140  142  140   Potassium 3.5 - 5.1 mmol/L 3.9  3.4  3.6   Chloride 98 - 111 mmol/L 106  108  106   CO2 22 - 32 mmol/L 26  25  25   $ Calcium 8.9 - 10.3 mg/dL 9.8   9.5  9.8   Total Protein 6.5 - 8.1 g/dL 7.2  6.8  6.8   Total Bilirubin 0.3 - 1.2 mg/dL 0.4  0.5  0.4   Alkaline Phos 38 - 126 U/L 93  74  75   AST 15 - 41 U/L 21  25  25   $ ALT 0 - 44 U/L 15  17  17     $ No results found for: "MG" Lab Results  Component Value Date   CA2729 11.2 11/10/2022     Adverse Effects Assessment Blood pressure elevations: likely not due from palbociclib. Will forward visit summary from today to [name] per patient request Scr elevations: has known CKD. Baseline value prior to CDK 4/6 inhibitors estimated at 1.52 on 07/06/22.  Adherence Assessment Michele Page reports missing 0 doses over the past 3 weeks.   Reason for missed dose: n/a Patient was re-educated on importance of adherence.   Access Assessment Michele Page is currently receiving her palbociclib through [facility] concerns:  none  Medication Reconciliation The patient's medication list was reviewed today with the patient? Yes New medications or herbal supplements have recently been started? No  Any medications have been discontinued? No  The medication list was updated and reconciled based on the patient's most recent medication list in the electronic medical record (EMR) including herbal products and OTC medications.   Medications Current Outpatient Medications  Medication Sig Dispense Refill   aspirin EC 81 MG tablet Take 1 tablet (81 mg total) by mouth daily. 30 tablet 6   atorvastatin (LIPITOR) 20 MG tablet Take 1 tablet (20 mg total) by mouth daily. 90 tablet 1   eszopiclone (LUNESTA) 2 MG TABS tablet Take one tablet by mouth at bedtime as needed for sleep (take immediately before bedtime) 30 tablet 3   labetalol (NORMODYNE) 300 MG tablet TAKE 2 TABLETS THREE TIMES DAILY 540 tablet 2   letrozole (FEMARA) 2.5 MG tablet Take 1 tablet (2.5 mg total) by mouth daily. 90 tablet 3   NIFEdipine (PROCARDIA XL/NIFEDICAL XL) 60 MG 24 hr tablet Take 1 tablet (60  mg total) by mouth 2 (two) times daily. 180 tablet 3   palbociclib (IBRANCE) 75 MG tablet Take 1 tablet (75 mg total)  by mouth daily. Take for 21 days on, 7 days off, repeat every 28 days. 21 tablet 3   No current facility-administered medications for this visit.    Drug-Drug Interactions (DDIs) DDIs were evaluated? Yes Significant DDIs? No  The patient was instructed to speak with their health care provider and/or the oral chemotherapy pharmacist before starting any new drug, including prescription or over the counter, natural / herbal products, or vitamins.  Supportive Care Fever: reviewed the importance of having a thermometer and the Centers for Disease Control and Prevention (CDC) definition of fever which is 100.29F (38C) or higher. Patient should call 24/7 triage at [phone] if experiencing a fever or any other symptoms Myelosuppression ILD / Pneumonitis Fatigue Alopecia N/V/D  Dosing Assessment Hepatic adjustments needed? No  Renal adjustments needed? No  Toxicity adjustments needed? No  The current dosing regimen is appropriate to continue at this time.  Follow-Up Plan Continue palbociclib 75 mg by mouth daily 21 out of 28-day cycle. She states she is 3 days in to her new cycle Continue letrozole 2.5 mg by mouth daily Continue to monitor Scr, Hgb, ANC She will continue to follow with [name], NP for her other comorbidities such as hypertension. Visit note from today being forwarded per patient request Labs, Dr. Lindi Adie visit on 03/01/22. Patient prefers to keep this appointment and then every 3 months per Dr. Lindi Adie recs. Last CT CAP done 07/08/22 Will add labs, pharmacy clinic visit, in 5 months (3 months after Dr. Geralyn Flash visit on 03/02/23).  Michele Page participated in the discussion, expressed understanding, and voiced agreement with the above plan. All questions were answered to her satisfaction. The patient was advised to contact the clinic at [phone]  [phone] with any questions or concerns prior to her return visit.   I spent 30 minutes assessing and educating the patient.  Raina Mina, RPH-CPP, 12/28/2022  1:11 PM  **Disclaimer: This note was dictated with voice recognition software. Similar sounding words can inadvertently be transcribed and this note may contain transcription errors which may not have been corrected upon publication of note.**

## 2022-12-29 ENCOUNTER — Telehealth: Payer: Self-pay | Admitting: Pharmacist

## 2022-12-29 NOTE — Telephone Encounter (Signed)
Scheduled appointment per 2/12 los. Left voicemail.

## 2023-01-12 ENCOUNTER — Other Ambulatory Visit: Payer: Self-pay | Admitting: *Deleted

## 2023-01-12 DIAGNOSIS — E7800 Pure hypercholesterolemia, unspecified: Secondary | ICD-10-CM

## 2023-01-12 MED ORDER — ATORVASTATIN CALCIUM 20 MG PO TABS: 20.0000 mg | ORAL_TABLET | Freq: Every day | ORAL | 1 refills | Status: DC

## 2023-01-12 NOTE — Telephone Encounter (Signed)
[facility] requested refill.

## 2023-01-14 ENCOUNTER — Other Ambulatory Visit (HOSPITAL_COMMUNITY): Payer: Self-pay

## 2023-01-18 ENCOUNTER — Other Ambulatory Visit (HOSPITAL_COMMUNITY): Payer: Self-pay

## 2023-01-18 DIAGNOSIS — R102 Pelvic and perineal pain: Secondary | ICD-10-CM | POA: Diagnosis not present

## 2023-01-18 DIAGNOSIS — R3915 Urgency of urination: Secondary | ICD-10-CM | POA: Diagnosis not present

## 2023-01-18 DIAGNOSIS — N812 Incomplete uterovaginal prolapse: Secondary | ICD-10-CM | POA: Diagnosis not present

## 2023-01-19 ENCOUNTER — Other Ambulatory Visit (HOSPITAL_COMMUNITY): Payer: Self-pay

## 2023-01-20 DIAGNOSIS — Z170 Estrogen receptor positive status [ER+]: Secondary | ICD-10-CM | POA: Diagnosis not present

## 2023-01-20 DIAGNOSIS — C50411 Malignant neoplasm of upper-outer quadrant of right female breast: Secondary | ICD-10-CM | POA: Diagnosis not present

## 2023-01-21 DIAGNOSIS — R102 Pelvic and perineal pain: Secondary | ICD-10-CM | POA: Diagnosis not present

## 2023-02-04 ENCOUNTER — Encounter: Payer: Medicare HMO | Admitting: Nurse Practitioner

## 2023-02-04 ENCOUNTER — Telehealth: Payer: Self-pay | Admitting: *Deleted

## 2023-02-04 NOTE — Progress Notes (Deleted)
Subjective:   Michele Page is a 67 y.o. female who presents for Medicare Annual (Subsequent) preventive examination.  Review of Systems           Objective:    There were no vitals filed for this visit. There is no height or weight on file to calculate BMI.     02/04/2023   10:49 AM 12/07/2022    2:20 PM 10/15/2022    2:05 PM 07/14/2022    9:47 AM 05/01/2022   12:50 PM 03/30/2022    2:28 PM 01/29/2022    2:16 PM  Advanced Directives  Does Patient Have a Medical Advance Directive? No No No No No No No  Would patient like information on creating a medical advance directive? No - Patient declined No - Patient declined No - Patient declined No - Patient declined No - Patient declined No - Patient declined     Current Medications (verified) Outpatient Encounter Medications as of 02/04/2023  Medication Sig   aspirin EC 81 MG tablet Take 1 tablet (81 mg total) by mouth daily.   atorvastatin (LIPITOR) 20 MG tablet Take 1 tablet (20 mg total) by mouth daily.   labetalol (NORMODYNE) 300 MG tablet TAKE 2 TABLETS THREE TIMES DAILY   letrozole (FEMARA) 2.5 MG tablet Take 1 tablet (2.5 mg total) by mouth daily.   NIFEdipine (PROCARDIA XL/NIFEDICAL XL) 60 MG 24 hr tablet Take 1 tablet (60 mg total) by mouth 2 (two) times daily.   palbociclib (IBRANCE) 75 MG tablet Take 1 tablet (75 mg total) by mouth daily. Take for 21 days on, 7 days off, repeat every 28 days.   [DISCONTINUED] eszopiclone (LUNESTA) 2 MG TABS tablet Take one tablet by mouth at bedtime as needed for sleep (take immediately before bedtime)   No facility-administered encounter medications on file as of 02/04/2023.    Allergies (verified) Patient has no known allergies.   History: Past Medical History:  Diagnosis Date   Anemia    past hx many yrs ago    Arthritis    lt knee   Breast cancer ([facility])    Breast cancer of upper-outer quadrant of left female breast ([facility]) 04/02/2016   Chronic kidney disease    CKD   Colon  polyps    COPD (chronic obstructive pulmonary disease) ([name])    GERD (gastroesophageal reflux disease)    years ago- not current    Hyperlipidemia    Hypertension    Multiple sclerosis ([facility])    Personal history of chemotherapy    Personal history of radiation therapy    Stroke ([facility])    2013, mild cognitive deficits   Vision abnormalities    Past Surgical History:  Procedure Laterality Date   BREAST BIOPSY Left 03/31/2016   BREAST BIOPSY Right 07/24/2021   BREAST LUMPECTOMY Left 04/17/2016   BREAST LUMPECTOMY Right 09/02/2021   BREAST LUMPECTOMY WITH RADIOACTIVE SEED AND SENTINEL LYMPH NODE BIOPSY Right 09/02/2021   Procedure: RIGHT BREAST LUMPECTOMY WITH RADIOACTIVE SEED AND SENTINEL LYMPH NODE BIOPSY;  Surgeon: Erroll Luna, MD;  Location: [facility];  Service: General;  Laterality: Right;   COLONOSCOPY     HYSTEROSCOPY WITH D & C N/A 12/19/2015   Procedure: DILATATION AND CURETTAGE /HYSTEROSCOPY;  Surgeon: Linda Hedges, DO;  Location: [facility];  Service: Gynecology;  Laterality: N/A;   POLYPECTOMY     PORTACATH PLACEMENT Right 10/16/2021   Procedure: INSERTION PORT-A-CATH;  Surgeon: Erroll Luna, MD;  Location: [facility];  Service: General;  Laterality: Right;   RADIOACTIVE SEED GUIDED PARTIAL MASTECTOMY WITH AXILLARY SENTINEL LYMPH NODE BIOPSY Left 04/17/2016   Procedure: RADIOACTIVE SEED GUIDED PARTIAL MASTECTOMY WITH AXILLARY SENTINEL LYMPH NODE BIOPSY;  Surgeon: Erroll Luna, MD;  Location: [facility];  Service: General;  Laterality: Left;  RADIOACTIVE SEED GUIDED PARTIAL MASTECTOMY WITH AXILLARY SENTINEL LYMPH NODE BIOPSY    TUBAL LIGATION     VAGINAL DELIVERY     x3   Family History  Problem Relation Age of Onset   Hypertension Mother    Stroke Mother    Stroke Sister    Alcohol abuse Brother    HIV/AIDS Brother    Breast cancer Cousin        dx 60s   Colon cancer Neg Hx    Colon polyps Neg Hx    Esophageal cancer  Neg Hx    Rectal cancer Neg Hx    Stomach cancer Neg Hx    Social History   Socioeconomic History   Marital status: Married    Spouse name: Not on file   Number of children: 3   Years of education: Not on file   Highest education level: Not on file  Occupational History   Occupation: retired  Tobacco Use   Smoking status: Former    Packs/day: 0.50    Years: 25.00    Additional pack years: 0.00    Total pack years: 12.50    Types: Cigarettes    Quit date: 11/16/2004    Years since quitting: 18.2   Smokeless tobacco: Never  Vaping Use   Vaping Use: Never used  Substance and Sexual Activity   Alcohol use: Not Currently    Comment: occas   Drug use: No   Sexual activity: Yes    Comment: 1st intercourse 29 yo-5 partners  Other Topics Concern   Not on file  Social History Narrative   Diet- N/A   Caffeine- Yes   Married- Yes   House- 2 story with 2 people   Pets- No   Current/past profession- Insurance underwriter daycare   Exercise- Yes   Living will-No   DNR-N/A   POA/HPOA-No      Works part-time at [employer] Strain: High Risk (03/25/2022)   Overall Financial Resource Strain (CARDIA)    Difficulty of Paying Living Expenses: Hard  Food Insecurity: No Food Insecurity (11/24/2017)   Hunger Vital Sign    Worried About Running Out of Food in the Last Year: Never true    Ran Out of Food in the Last Year: Never true  Transportation Needs: No Transportation Needs (11/24/2017)   PRAPARE - Hydrologist (Medical): No    Lack of Transportation (Non-Medical): No  Physical Activity: Insufficiently Active (11/24/2017)   Exercise Vital Sign    Days of Exercise per Week: 1 day    Minutes of Exercise per Session: 90 min  Stress: No Stress Concern Present (11/24/2017)   [place]    Feeling of Stress : Only a little  Social Connections: Moderately  Integrated (11/24/2017)   Social Connection and Isolation Panel [NHANES]    Frequency of Communication with Friends and Family: Twice a week    Frequency of Social Gatherings with Friends and Family: Once a week    Attends Religious Services: More than 4 times per year    Active Member of [employer]  or Organizations: No    Attends Archivist Meetings: Never    Marital Status: Married    Tobacco Counseling Counseling given: Not Answered   Clinical Intake:                 Diabetic?no         Activities of Daily Living     No data to display          Patient Care Team: [name], NP as PCP - General (Geriatric Medicine) Lorretta Harp, MD as PCP - Cardiology (Cardiology) Nicholas Lose, MD as Consulting Physician (Oncology) Erroll Luna, MD as Consulting Physician (General Surgery)  Indicate any recent Medical Services you may have received from other than [facility] providers in the past year (date may be approximate).     Assessment:   This is a routine wellness examination for Michele Page.  Hearing/Vision screen No results found.  Dietary issues and exercise activities discussed:     Goals Addressed   None    Depression Screen    02/04/2023   10:50 AM 05/01/2022   12:43 PM 01/29/2022    2:14 PM 12/10/2020    3:48 PM 11/20/2020    2:07 PM 09/19/2020    8:31 AM 05/13/2020    8:52 AM  PHQ 2/9 Scores  PHQ - 2 Score 0 0 0 0 0 0 0    Fall Risk    02/04/2023   10:49 AM 12/03/2022    2:00 PM 05/01/2022   12:43 PM 01/29/2022    2:15 PM 04/18/2021    1:09 PM  [place] in the past year? 0 0 0 0 0  Number falls in past yr: 0 0 0 0 0  Injury with Fall? 0 0 0 0 0  Risk for fall due to :  No Fall Risks No Fall Risks No Fall Risks   Follow up    Falls evaluation completed     FALL RISK PREVENTION PERTAINING TO THE HOME:  Any stairs in or around the home? {YES/NO:21197} If so, are there any without handrails? {YES/NO:21197} Home free of  loose throw rugs in walkways, pet beds, electrical cords, etc? {YES/NO:21197} Adequate lighting in your home to reduce risk of falls? {YES/NO:21197}  ASSISTIVE DEVICES UTILIZED TO PREVENT FALLS:  Life alert? {YES/NO:21197} Use of a cane, walker or w/c? {YES/NO:21197} Grab bars in the bathroom? {YES/NO:21197} Shower chair or bench in shower? {YES/NO:21197} Elevated toilet seat or a handicapped toilet? {YES/NO:21197}  TIMED UP AND GO:  Was the test performed? No .    Cognitive Function:    12/11/2016    1:33 PM  MMSE - Mini Mental State Exam  Orientation to time 5  Orientation to Place 4  Registration 3  Attention/ Calculation 5  Recall 2  Language- name 2 objects 2  Language- repeat 1  Language- follow 3 step command 2  Language- read & follow direction 1  Write a sentence 1  Copy design 0  Total score 26        02/04/2023   10:50 AM 01/29/2022    2:17 PM 12/10/2020    3:51 PM  6CIT Screen  What Year? 0 points 0 points 0 points  What month? 0 points 0 points 0 points  What time? 0 points 0 points 0 points  Count back from 20 0 points 0 points 0 points  Months in reverse 0 points 4 points 0 points  Repeat phrase 0 points  10 points 2 points  Total Score 0 points 14 points 2 points    Immunizations Immunization History  Administered Date(s) Administered   Influenza,inj,Quad PF,6+ Mos 08/07/2015, 08/06/2016, 11/24/2017, 10/26/2018, 07/12/2019   PPD Test 07/13/2017   Pneumococcal Conjugate-13 04/18/2021    {TDAP status:2101805}  {Flu Vaccine status:2101806}  {Pneumococcal vaccine status:2101807}  {Covid-19 vaccine status:2101808}  Qualifies for Shingles Vaccine? {YES/NO:21197}  Zostavax completed {YES/NO:21197}  {Shingrix Completed?:2101804}  Screening Tests Health Maintenance  Topic Date Due   DTaP/Tdap/Td (1 - Tdap) Never done   Medicare Annual Wellness (AWV)  01/30/2023   INFLUENZA VACCINE  02/14/2023 (Originally 06/16/2022)   Pneumonia Vaccine 26+  Years old (2 of 2 - PPSV23 or PCV20) 05/02/2023 (Originally 06/13/2021)   COVID-19 Vaccine (1) 11/17/2023 (Originally 08/03/1961)   Zoster Vaccines- Shingrix (1 of 2) 11/17/2023 (Originally 08[birth year]03/1974)   COLONOSCOPY (Pts 45-55yrs Insurance coverage will need to be confirmed)  01/14/2024   MAMMOGRAM  09/22/2024   DEXA SCAN  Completed   Hepatitis C Screening  Completed   HPV VACCINES  Aged Out    Health Maintenance  Health Maintenance Due  Topic Date Due   DTaP/Tdap/Td (1 - Tdap) Never done   Medicare Annual Wellness (AWV)  01/30/2023    Colorectal cancer screening: Type of screening: Colonoscopy. Completed 2022. Repeat every 3 years  Mammogram status: Completed 09/22/22. Repeat every year  Bone Density status: Completed 04/21/21. Results reflect: Bone density results: NORMAL. Repeat every 5 years.  Lung Cancer Screening: (Low Dose CT Chest recommended if Age 53-80 years, 30 pack-year currently smoking OR have quit w/in 15years.) does not qualify.   Lung Cancer Screening Referral: na  Additional Screening:  Hepatitis C Screening: does not qualify; Completed 2021  Vision Screening: Recommended annual ophthalmology exams for early detection of glaucoma and other disorders of the eye. Is the patient up to date with their annual eye exam?  {YES/NO:21197} Who is the provider or what is the name of the office in which the patient attends annual eye exams? *** If pt is not established with a provider, would they like to be referred to a provider to establish care? No .   Dental Screening: Recommended annual dental exams for proper oral hygiene  Community Resource Referral / Chronic Care Management: CRR required this visit?  No   CCM required this visit?  No      Plan:     I have personally reviewed and noted the following in the patient's chart:   Medical and social history Use of alcohol, tobacco or illicit drugs  Current medications and supplements including opioid  prescriptions. Patient is not currently taking opioid prescriptions. Functional ability and status Nutritional status Physical activity Advanced directives List of other physicians Hospitalizations, surgeries, and ER visits in previous 12 months Vitals Screenings to include cognitive, depression, and falls Referrals and appointments  In addition, I have reviewed and discussed with patient certain preventive protocols, quality metrics, and best practice recommendations. A written personalized care plan for preventive services as well as general preventive health recommendations were provided to patient.     [name], NP   02/04/2023   Virtual Visit via Video Note  I connected with Michele Page on 02/04/23 at 11:00 AM EDT by a video enabled telemedicine application and verified that I am speaking with the correct person using two identifiers.  Location: Patient: home Provider: [facility]    I discussed the limitations of evaluation and management by telemedicine and the availability of  in person appointments. The patient expressed understanding and agreed to proceed.    I discussed the assessment and treatment plan with the patient. The patient was provided an opportunity to ask questions and all were answered. The patient agreed with the plan and demonstrated an understanding of the instructions.   The patient was advised to call back or seek an in-person evaluation if the symptoms worsen or if the condition fails to improve as anticipated.  I provided 15 minutes of non-face-to-face time during this encounter.  Carlos American. Dewaine Oats, AGNP Avs printed and mailed.

## 2023-02-04 NOTE — Telephone Encounter (Signed)
Ms. farren, edgell are scheduled for a virtual visit with your provider today.    Just as we do with appointments in the office, we must obtain your consent to participate.  Your consent will be active for this visit and any virtual visit you Luz Mares have with one of our providers in the next 365 days.    If you have a MyChart account, I can also send a copy of this consent to you electronically.  All virtual visits are billed to your insurance company just like a traditional visit in the office.  As this is a virtual visit, video technology does not allow for your provider to perform a traditional examination.  This Yennifer Segovia limit your provider's ability to fully assess your condition.  If your provider identifies any concerns that need to be evaluated in person or the need to arrange testing such as labs, EKG, etc, we will make arrangements to do so.    Although advances in technology are sophisticated, we cannot ensure that it will always work on either your end or our end.  If the connection with a video visit is poor, we Elio Haden have to switch to a telephone visit.  With either a video or telephone visit, we are not always able to ensure that we have a secure connection.   I need to obtain your verbal consent now.   Are you willing to proceed with your visit today?   SHILO RIENSTRA has provided verbal consent on 02/04/2023 for a virtual visit (video or telephone).   MayAlbertina Senegal, [place] 02/04/2023  10:54 AM

## 2023-02-04 NOTE — Progress Notes (Signed)
  This service is provided via telemedicine  No vital signs collected/recorded due to the encounter was a telemedicine visit.   Location of patient (ex: home, work):  Work  Patient consents to a telephone visit:  Yes  Location of the provider (ex: office, home):  [place].   Name of any referring provider:  na  Names of all persons participating in the telemedicine service and their role in the encounter:  Michele Page, Patient, Rodena Piety Bryon Parker,CMA, Omar Person, NP  Time spent on call:  8:13  Patient keeps popping on and off per [place]. Tried calling patient 3 times to connect and no Answer.

## 2023-02-09 ENCOUNTER — Other Ambulatory Visit: Payer: Self-pay | Admitting: *Deleted

## 2023-02-09 DIAGNOSIS — Z170 Estrogen receptor positive status [ER+]: Secondary | ICD-10-CM

## 2023-02-09 NOTE — Progress Notes (Signed)
Yearly Signatera renewal orders placed.

## 2023-02-11 ENCOUNTER — Other Ambulatory Visit (HOSPITAL_COMMUNITY): Payer: Self-pay

## 2023-02-16 ENCOUNTER — Other Ambulatory Visit: Payer: Self-pay

## 2023-03-01 NOTE — Progress Notes (Signed)
Patient Care Team: Sharon Seller, NP as PCP - General (Geriatric Medicine) Runell Gess, MD as PCP - Cardiology (Cardiology) Serena Croissant, MD as Consulting Physician (Oncology) Harriette Bouillon, MD as Consulting Physician (General Surgery)  DIAGNOSIS: No diagnosis found.  SUMMARY OF ONCOLOGIC HISTORY: Oncology History  Breast cancer of upper-outer quadrant of left female breast  03/31/2016 Initial Diagnosis   Screening detected left breast mass, 2 nodules, 1.9 x 1.6 x 0.8 cm= fat necrosis; 8 x 7 x 7 mm= grade 2 IDC ER 100%, PR 100%, HER-2 negative ratio 1.39, Ki-67 20%   04/17/2016 Surgery   Left lumpectomy ([name]): IDC grade 2, 1.3 cm, with DCIS, margins negative, 0/4 lymph nodes negative, T1 cN0 stage IA pathologic stage, ER 100%, PR 100%, HER-2 negative ratio 1.39, Ki-67 20% Oncotype DX score 20, 13% ROR, intermediate risk   04/17/2016 Oncotype testing   Recurrence score: 20; ROR 15% (intermediate risk)    05/27/2016 - 07/13/2016 Radiation Therapy   Adjuvant radiation therapy [facility]): Left breast treated with breath hold to 50.4 Gy in 28 fractions at 1.8 Gy/fraction.  Left breast boosted to 10 Gy in 5 fractions at 2 Gy/fraction   07/23/2016 -  Anti-estrogen oral therapy   Anastrozole 1 mg switched to letrozole 04/22/2017 due to hair loss, switch to tamoxifen 10/22/2017 due to hair loss from letrozole as well   Malignant neoplasm of upper-outer quadrant of right breast in female, estrogen receptor positive  08/04/2021 Initial Diagnosis   Malignant neoplasm of upper-outer quadrant of right breast in female, estrogen receptor positive ([name])    Relapse/Recurrence   2.2 cm ILC ER 100%, PR 90%, Her 2 Neg, Ki 67: 25%, Size 2.5 cm   08/04/2021 Cancer Staging   Staging form: Breast, AJCC 8th Edition - Clinical: Stage IB (cT2, cN0, cM0, G2, ER+, PR+, HER2-) - Signed by Serena Croissant, MD on 08/04/2021 Stage prefix: Initial diagnosis Histologic grading system: 3 grade system    09/02/2021 Surgery   Right lumpectomy: Grade 2 ILC 3.1 cm, margins negative, LVI present, 4/6 lymph nodes macrometastases, 2 lymph nodes isolated tumor cells, ER 100%, PR 90%, Ki-67 25%, HER2 negative   10/17/2021 - 01/30/2022 Chemotherapy   Patient is on Treatment Plan : BREAST Adjuvant CMF IV q21d      Genetic Testing   Negative genetic testing. No pathogenic variants identified on the [facility] CustomNext+RNA panel. The report date is 03/04/2022.  The CustomNext-Cancer+RNAinsight panel offered by [employer] includes sequencing and rearrangement analysis for the following 47 genes:  APC, ATM, AXIN2, BARD1, BMPR1A, BRCA1, BRCA2, BRIP1, CDH1, CDK4, CDKN2A, CHEK2, DICER1, EPCAM, GREM1, HOXB13, MEN1, MLH1, MSH2, MSH3, MSH6, MUTYH, NBN, NF1, NF2, NTHL1, PALB2, PMS2, POLD1, POLE, PTEN, RAD51C, RAD51D, RECQL, RET, SDHA, SDHAF2, SDHB, SDHC, SDHD, SMAD4, SMARCA4, STK11, TP53, TSC1, TSC2, and VHL.  RNA data is routinely analyzed for use in variant interpretation for all genes.     CHIEF COMPLIANT:   INTERVAL HISTORY: STEVEN PLACERES is a   ALLERGIES:  has No Known Allergies.  MEDICATIONS:  Current Outpatient Medications  Medication Sig Dispense Refill   aspirin EC 81 MG tablet Take 1 tablet (81 mg total) by mouth daily. 30 tablet 6   atorvastatin (LIPITOR) 20 MG tablet Take 1 tablet (20 mg total) by mouth daily. 90 tablet 1   labetalol (NORMODYNE) 300 MG tablet TAKE 2 TABLETS THREE TIMES DAILY 540 tablet 2   letrozole (FEMARA) 2.5 MG tablet Take 1 tablet (2.5 mg total) by mouth daily.  90 tablet 3   NIFEdipine (PROCARDIA XL/NIFEDICAL XL) 60 MG 24 hr tablet Take 1 tablet (60 mg total) by mouth 2 (two) times daily. 180 tablet 3   palbociclib (IBRANCE) 75 MG tablet Take 1 tablet (75 mg total) by mouth daily. Take for 21 days on, 7 days off, repeat every 28 days. 21 tablet 3   No current facility-administered medications for this visit.    PHYSICAL EXAMINATION: ECOG PERFORMANCE STATUS: {CHL ONC ECOG  PS:[phone]}  There were no vitals filed for this visit. There were no vitals filed for this visit.  BREAST:*** No palpable masses or nodules in either right or left breasts. No palpable axillary supraclavicular or infraclavicular adenopathy no breast tenderness or nipple discharge. (exam performed in the presence of a chaperone)  LABORATORY DATA:  I have reviewed the data as listed    Latest Ref Rng & Units 12/28/2022   11:53 AM 12/07/2022   12:15 PM 11/23/2022   12:45 PM  CMP  Glucose 70 - 99 mg/dL 353  299  242   BUN 8 - 23 mg/dL 23  20  18    Creatinine 0.44 - 1.00 mg/dL 6.83  4.19  6.22   Sodium 135 - 145 mmol/L 140  142  140   Potassium 3.5 - 5.1 mmol/L 3.9  3.4  3.6   Chloride 98 - 111 mmol/L 106  108  106   CO2 22 - 32 mmol/L 26  25  25    Calcium 8.9 - 10.3 mg/dL 9.8  9.5  9.8   Total Protein 6.5 - 8.1 g/dL 7.2  6.8  6.8   Total Bilirubin 0.3 - 1.2 mg/dL 0.4  0.5  0.4   Alkaline Phos 38 - 126 U/L 93  74  75   AST 15 - 41 U/L 21  25  25    ALT 0 - 44 U/L 15  17  17      Lab Results  Component Value Date   WBC 3.2 (L) 12/28/2022   HGB 10.7 (L) 12/28/2022   HCT 30.8 (L) 12/28/2022   MCV 97.2 12/28/2022   PLT 216 12/28/2022   NEUTROABS 1.7 12/28/2022    ASSESSMENT & PLAN:  No problem-specific Assessment & Plan notes found for this encounter.    No orders of the defined types were placed in this encounter.  The patient has a good understanding of the overall plan. she agrees with it. she will call with any problems that may develop before the next visit here. Total time spent: 30 mins including face to face time and time spent for planning, charting and co-ordination of care   Sherlyn Lick, CMA 03/01/23    I Janan Ridge am acting as a Neurosurgeon for [employer]  ***

## 2023-03-02 ENCOUNTER — Inpatient Hospital Stay: Payer: Medicare HMO | Attending: Hematology and Oncology

## 2023-03-02 ENCOUNTER — Inpatient Hospital Stay (HOSPITAL_BASED_OUTPATIENT_CLINIC_OR_DEPARTMENT_OTHER): Payer: Medicare HMO | Admitting: Hematology and Oncology

## 2023-03-02 VITALS — BP 130/63 | HR 88 | Temp 97.3°F | Resp 18 | Ht 63.0 in | Wt 200.2 lb

## 2023-03-02 DIAGNOSIS — C50411 Malignant neoplasm of upper-outer quadrant of right female breast: Secondary | ICD-10-CM | POA: Diagnosis not present

## 2023-03-02 DIAGNOSIS — R519 Headache, unspecified: Secondary | ICD-10-CM | POA: Diagnosis not present

## 2023-03-02 DIAGNOSIS — Z170 Estrogen receptor positive status [ER+]: Secondary | ICD-10-CM

## 2023-03-02 LAB — CMP (CANCER CENTER ONLY)
ALT: 11 U/L (ref 0–44)
AST: 18 U/L (ref 15–41)
Albumin: 4.4 g/dL (ref 3.5–5.0)
Alkaline Phosphatase: 86 U/L (ref 38–126)
Anion gap: 9 (ref 5–15)
BUN: 22 mg/dL (ref 8–23)
CO2: 27 mmol/L (ref 22–32)
Calcium: 9.5 mg/dL (ref 8.9–10.3)
Chloride: 107 mmol/L (ref 98–111)
Creatinine: 2.21 mg/dL — ABNORMAL HIGH (ref 0.44–1.00)
GFR, Estimated: 24 mL/min — ABNORMAL LOW
Glucose, Bld: 125 mg/dL — ABNORMAL HIGH (ref 70–99)
Potassium: 3.8 mmol/L (ref 3.5–5.1)
Sodium: 143 mmol/L (ref 135–145)
Total Bilirubin: 0.4 mg/dL (ref 0.3–1.2)
Total Protein: 7.3 g/dL (ref 6.5–8.1)

## 2023-03-02 LAB — CBC WITH DIFFERENTIAL (CANCER CENTER ONLY)
Abs Immature Granulocytes: 0.01 10*3/uL (ref 0.00–0.07)
Basophils Absolute: 0.1 10*3/uL (ref 0.0–0.1)
Basophils Relative: 3 %
Eosinophils Absolute: 0 10*3/uL (ref 0.0–0.5)
Eosinophils Relative: 1 %
HCT: 30.4 % — ABNORMAL LOW (ref 36.0–46.0)
Hemoglobin: 10.7 g/dL — ABNORMAL LOW (ref 12.0–15.0)
Immature Granulocytes: 0 %
Lymphocytes Relative: 25 %
Lymphs Abs: 0.8 10*3/uL (ref 0.7–4.0)
MCH: 35.4 pg — ABNORMAL HIGH (ref 26.0–34.0)
MCHC: 35.2 g/dL (ref 30.0–36.0)
MCV: 100.7 fL — ABNORMAL HIGH (ref 80.0–100.0)
Monocytes Absolute: 0.2 10*3/uL (ref 0.1–1.0)
Monocytes Relative: 6 %
Neutro Abs: 2.2 10*3/uL (ref 1.7–7.7)
Neutrophils Relative %: 65 %
Platelet Count: 353 10*3/uL (ref 150–400)
RBC: 3.02 MIL/uL — ABNORMAL LOW (ref 3.87–5.11)
RDW: 13.1 % (ref 11.5–15.5)
Smear Review: NORMAL
WBC Count: 3.4 10*3/uL — ABNORMAL LOW (ref 4.0–10.5)
nRBC: 0 % (ref 0.0–0.2)

## 2023-03-02 NOTE — Assessment & Plan Note (Signed)
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 systemic chemotherapy with CMF x6 cycles completed 01/30/2022 2. adjuvant radiation therapy completing 04/09/2022 3.  For the continued antiestrogen therapy (previously anastrozole started 07/23/2016 switched to letrozole 2018 switched to tamoxifen 2018, switched back to anastrozole) hair loss was the main problem, may consider exemestane ------------------------------------------------------------------------------------------------------------------------------- Current treatment: Letrozole with Ibrance.  Patient could not tolerate Verzinio Signatera:  06/12/2022: Positive (0.52) 09/02/2022: Positive (68.11) CT CAP 07/10/2022: No metastatic disease.  Tiny pulmonary nodules nonspecific. The rise in the Signatera testing is concerning for progression of disease.  Ibrance toxicities: Tolerating 75 mg dose extremely well.

## 2023-03-15 ENCOUNTER — Other Ambulatory Visit: Payer: Self-pay

## 2023-03-15 LAB — SIGNATERA ONLY (NATERA MANAGED)

## 2023-03-17 ENCOUNTER — Other Ambulatory Visit: Payer: Self-pay | Admitting: Hematology and Oncology

## 2023-03-17 ENCOUNTER — Other Ambulatory Visit: Payer: Self-pay

## 2023-03-17 ENCOUNTER — Other Ambulatory Visit (HOSPITAL_COMMUNITY): Payer: Self-pay

## 2023-03-17 MED ORDER — PALBOCICLIB 75 MG PO TABS
75.0000 mg | ORAL_TABLET | Freq: Every day | ORAL | 3 refills | Status: DC
  Filled 2023-03-17: qty 21, 28d supply, fill #0
  Filled 2023-04-08: qty 21, 28d supply, fill #1
  Filled 2023-05-06: qty 21, 28d supply, fill #2
  Filled 2023-06-01: qty 21, 28d supply, fill #3

## 2023-03-18 ENCOUNTER — Other Ambulatory Visit: Payer: Self-pay

## 2023-03-22 DIAGNOSIS — Z170 Estrogen receptor positive status [ER+]: Secondary | ICD-10-CM | POA: Diagnosis not present

## 2023-03-22 DIAGNOSIS — C50411 Malignant neoplasm of upper-outer quadrant of right female breast: Secondary | ICD-10-CM | POA: Diagnosis not present

## 2023-03-29 ENCOUNTER — Other Ambulatory Visit: Payer: Self-pay

## 2023-03-29 ENCOUNTER — Other Ambulatory Visit: Payer: Self-pay | Admitting: *Deleted

## 2023-03-29 DIAGNOSIS — Z170 Estrogen receptor positive status [ER+]: Secondary | ICD-10-CM

## 2023-03-30 ENCOUNTER — Encounter: Payer: Self-pay | Admitting: Hematology and Oncology

## 2023-03-31 NOTE — Progress Notes (Signed)
Patient Care Team: Sharon Seller, NP as PCP - General (Geriatric Medicine) Runell Gess, MD as PCP - Cardiology (Cardiology) Serena Croissant, MD as Consulting Physician (Oncology) Harriette Bouillon, MD as Consulting Physician (General Surgery)  DIAGNOSIS:  Encounter Diagnoses  Name Primary?   Malignant neoplasm of upper-outer quadrant of left breast in female, estrogen receptor positive ([name]) Yes   Malignant neoplasm of upper-outer quadrant of right breast in female, estrogen receptor positive ([name])     SUMMARY OF ONCOLOGIC HISTORY: Oncology History  Breast cancer of 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)  03/31/2016 Initial Diagnosis   Screening detected left breast mass, 2 nodules, 1.9 x 1.6 x 0.8 cm= fat necrosis; 8 x 7 x 7 mm= grade 2 IDC ER 100%, PR 100%, HER-2 negative ratio 1.39, Ki-67 20%   04/17/2016 Surgery   Left lumpectomy ([name]): IDC grade 2, 1.3 cm, with DCIS, margins negative, 0/4 lymph nodes negative, T1 cN0 stage IA pathologic stage, ER 100%, PR 100%, HER-2 negative ratio 1.39, Ki-67 20% Oncotype DX score 20, 13% ROR, intermediate risk   04/17/2016 Oncotype testing   Recurrence score: 20; ROR 15% (intermediate risk)    05/27/2016 - 07/13/2016 Radiation Therapy   Adjuvant radiation therapy [facility]): Left breast treated with breath hold to 50.4 Gy in 28 fractions at 1.8 Gy/fraction.  Left breast boosted to 10 Gy in 5 fractions at 2 Gy/fraction   07/23/2016 -  Anti-estrogen oral therapy   Anastrozole 1 mg switched to letrozole 04/22/2017 due to hair loss, switch to tamoxifen 10/22/2017 due to hair loss from letrozole as well   Malignant neoplasm of upper-outer quadrant of right breast in female, estrogen receptor positive ([name])  08/04/2021 Initial Diagnosis   Malignant neoplasm of upper-outer quadrant of right breast in female, estrogen receptor positive ([name])    Relapse/Recurrence   2.2 cm ILC ER 100%, PR 90%, Her 2 Neg, Ki 67: 25%, Size 2.5 cm    08/04/2021 Cancer Staging   Staging form: Breast, AJCC 8th Edition - Clinical: Stage IB (cT2, cN0, cM0, G2, ER+, PR+, HER2-) - Signed by Serena Croissant, MD on 08/04/2021 Stage prefix: Initial diagnosis Histologic grading system: 3 grade system   09/02/2021 Surgery   Right lumpectomy: Grade 2 ILC 3.1 cm, margins negative, LVI present, 4/6 lymph nodes macrometastases, 2 lymph nodes isolated tumor cells, ER 100%, PR 90%, Ki-67 25%, HER2 negative   10/17/2021 - 01/30/2022 Chemotherapy   Patient is on Treatment Plan : BREAST Adjuvant CMF IV q21d      Genetic Testing   Negative genetic testing. No pathogenic variants identified on the [employer] CustomNext+RNA panel. The report date is 03/04/2022.  The CustomNext-Cancer+RNAinsight panel offered by [employer] includes sequencing and rearrangement analysis for the following 47 genes:  APC, ATM, AXIN2, BARD1, BMPR1A, BRCA1, BRCA2, BRIP1, CDH1, CDK4, CDKN2A, CHEK2, DICER1, EPCAM, GREM1, HOXB13, MEN1, MLH1, MSH2, MSH3, MSH6, MUTYH, NBN, NF1, NF2, NTHL1, PALB2, PMS2, POLD1, POLE, PTEN, RAD51C, RAD51D, RECQL, RET, SDHA, SDHAF2, SDHB, SDHC, SDHD, SMAD4, SMARCA4, STK11, TP53, TSC1, TSC2, and VHL.  RNA data is routinely analyzed for use in variant interpretation for all genes.     CHIEF COMPLIANT: Follow-up on Ibrance with letrozole/ scans  INTERVAL HISTORY: DONNALEE WURTS is a 68 year old with above-mentioned history of breast cancer who is currently on Ibrance with letrozole. She presents to the clinic for a follow-up. She reports that she has pain and swelling in the right breast. She is extremely stressed out about the rise in  the Signatera levels.  She reviewed the result of the CT scan which seem to suggest an L2 vertebral body lesion.   ALLERGIES:  has No Known Allergies.  MEDICATIONS:  Current Outpatient Medications  Medication Sig Dispense Refill   aspirin EC 81 MG tablet Take 1 tablet (81 mg total) by mouth daily. 30 tablet 6   atorvastatin  (LIPITOR) 20 MG tablet Take 1 tablet (20 mg total) by mouth daily. 90 tablet 1   labetalol (NORMODYNE) 300 MG tablet TAKE 2 TABLETS THREE TIMES DAILY 540 tablet 2   letrozole (FEMARA) 2.5 MG tablet Take 1 tablet (2.5 mg total) by mouth daily. 90 tablet 3   NIFEdipine (PROCARDIA XL/NIFEDICAL XL) 60 MG 24 hr tablet Take 1 tablet (60 mg total) by mouth 2 (two) times daily. 180 tablet 3   palbociclib (IBRANCE) 75 MG tablet Take 1 tablet (75 mg total) by mouth daily. Take for 21 days on, 7 days off, repeat every 28 days. 21 tablet 3   No current facility-administered medications for this visit.    PHYSICAL EXAMINATION: ECOG PERFORMANCE STATUS: 1 - Symptomatic but completely ambulatory  Vitals:   04/05/23 0947  BP: (!) 148/63  Pulse: 75  Temp: 98 F (36.7 C)  SpO2: 99%   Filed Weights   04/05/23 0947  Weight: 202 lb 8 oz (91.9 kg)      LABORATORY DATA:  I have reviewed the data as listed    Latest Ref Rng & Units 03/02/2023   12:36 PM 12/28/2022   11:53 AM 12/07/2022   12:15 PM  CMP  Glucose 70 - 99 mg/dL 409  811  914   BUN 8 - 23 mg/dL 22  23  20    Creatinine 0.44 - 1.00 mg/dL 7.82  9.56  2.13   Sodium 135 - 145 mmol/L 143  140  142   Potassium 3.5 - 5.1 mmol/L 3.8  3.9  3.4   Chloride 98 - 111 mmol/L 107  106  108   CO2 22 - 32 mmol/L 27  26  25    Calcium 8.9 - 10.3 mg/dL 9.5  9.8  9.5   Total Protein 6.5 - 8.1 g/dL 7.3  7.2  6.8   Total Bilirubin 0.3 - 1.2 mg/dL 0.4  0.4  0.5   Alkaline Phos 38 - 126 U/L 86  93  74   AST 15 - 41 U/L 18  21  25    ALT 0 - 44 U/L 11  15  17      Lab Results  Component Value Date   WBC 3.4 (L) 03/02/2023   HGB 10.7 (L) 03/02/2023   HCT 30.4 (L) 03/02/2023   MCV 100.7 (H) 03/02/2023   PLT 353 03/02/2023   NEUTROABS 2.2 03/02/2023    ASSESSMENT & PLAN:  Malignant neoplasm of upper-outer quadrant of right breast in female, estrogen receptor positive ([name]) 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, 1.3 cm, with DCIS, margins negative, 0/4  lymph nodes negative, T1 cN0 stage IA pathologic stage, ER 100%, PR 100%, HER-2 negative ratio 1.39, Ki-67 20% (Originally 2 nodules were detected on screening mammogram 1.9 cm= fat necrosis and 8 mm IDC) Oncotype DX score 20, 13% risk of recurrence, intermediate risk Adjuvant radiation therapy started 07/20/2017completed 07/13/2016     09/02/2021:Right lumpectomy: Grade 2 ILC 3.1 cm, margins negative, LVI present, 4/6 lymph nodes macrometastases, 2 lymph nodes isolated tumor cells, ER 100%, PR 90%, Ki-67 25%, HER2 negative   Treatment plan/summary: 1. systemic chemotherapy  with CMF x6 cycles completed 01/30/2022 2. adjuvant radiation therapy completing 04/09/2022 3.  For the continued antiestrogen therapy (previously anastrozole started 07/23/2016 switched to letrozole 2018 switched to tamoxifen 2018, switched back to anastrozole) hair loss was the main problem, may consider exemestane ------------------------------------------------------------------------------------------------------------------------------- Current treatment: Letrozole with Ibrance.  Patient could not tolerate Verzinio Signatera:  06/12/2022: Positive (0.52) 09/02/2022: Positive (68.11) Mar 22, 2023: Positive (106.54)  CT CAP 07/10/2022: No metastatic disease.  Tiny pulmonary nodules nonspecific. CT CAP 04/02/2023: New peripherally sclerotic lesion superior endplate L2 suspicious for treated metastasis or Schmorl's node.  No other sites of metastatic disease identified  The rise in the Signatera testing is concerning for progression of disease.   Ibrance toxicities: Tolerating 75 mg dose extremely well.  (She could not tolerate Verzinio) Given the fact that there is no clear-cut radiological evidence of progression I recommended continuation of the same treatment and rechecking in 3 months.  We did not change treatments based upon Signatera test results.  We will draw iron skin scans to determine if a change in treatment is  warranted.      Orders Placed This Encounter  Procedures   CT CHEST ABDOMEN PELVIS W CONTRAST    Standing Status:   Future    Standing Expiration Date:   04/04/2024    Order Specific Question:   If indicated for the ordered procedure, I authorize the administration of contrast media per Radiology protocol    Answer:   Yes    Order Specific Question:   Does the patient have a contrast media/X-ray dye allergy?    Answer:   No    Order Specific Question:   Preferred imaging location?    Answer:   [facility]    Order Specific Question:   If indicated for the ordered procedure, I authorize the administration of oral contrast media per Radiology protocol    Answer:   Yes   The patient has a good understanding of the overall plan. she agrees with it. she will call with any problems that may develop before the next visit here. Total time spent: 30 mins including face to face time and time spent for planning, charting and co-ordination of care   Tamsen Meek, MD 04/05/23    I Janan Ridge am acting as a Neurosurgeon for [employer]  I have reviewed the above documentation for accuracy and completeness, and I agree with the above.

## 2023-04-01 ENCOUNTER — Ambulatory Visit (HOSPITAL_COMMUNITY): Payer: Medicare HMO

## 2023-04-01 ENCOUNTER — Ambulatory Visit (HOSPITAL_COMMUNITY)
Admission: RE | Admit: 2023-04-01 | Discharge: 2023-04-01 | Disposition: A | Discharge status: Home / Self Care | Payer: Medicare HMO | Source: Ambulatory Visit | Attending: Hematology and Oncology | Admitting: Hematology and Oncology

## 2023-04-01 DIAGNOSIS — Z170 Estrogen receptor positive status [ER+]: Secondary | ICD-10-CM | POA: Diagnosis not present

## 2023-04-01 DIAGNOSIS — C50411 Malignant neoplasm of upper-outer quadrant of right female breast: Secondary | ICD-10-CM | POA: Insufficient documentation

## 2023-04-01 DIAGNOSIS — C50919 Malignant neoplasm of unspecified site of unspecified female breast: Secondary | ICD-10-CM | POA: Diagnosis not present

## 2023-04-05 ENCOUNTER — Inpatient Hospital Stay: Payer: Medicare HMO | Attending: Hematology and Oncology | Admitting: Hematology and Oncology

## 2023-04-05 VITALS — BP 148/63 | HR 75 | Temp 98.0°F | Wt 202.5 lb

## 2023-04-05 DIAGNOSIS — Z170 Estrogen receptor positive status [ER+]: Secondary | ICD-10-CM | POA: Diagnosis not present

## 2023-04-05 DIAGNOSIS — C50411 Malignant neoplasm of upper-outer quadrant of right female breast: Secondary | ICD-10-CM | POA: Insufficient documentation

## 2023-04-05 DIAGNOSIS — Z79811 Long term (current) use of aromatase inhibitors: Secondary | ICD-10-CM | POA: Diagnosis not present

## 2023-04-05 DIAGNOSIS — C50412 Malignant neoplasm of upper-outer quadrant of left female breast: Secondary | ICD-10-CM | POA: Diagnosis not present

## 2023-04-05 NOTE — Assessment & Plan Note (Addendum)
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 systemic chemotherapy with CMF x6 cycles completed 01/30/2022 2. adjuvant radiation therapy completing 04/09/2022 3.  For the continued antiestrogen therapy (previously anastrozole started 07/23/2016 switched to letrozole 2018 switched to tamoxifen 2018, switched back to anastrozole) hair loss was the main problem, may consider exemestane ------------------------------------------------------------------------------------------------------------------------------- Current treatment: Letrozole with Ibrance.  Patient could not tolerate Verzinio Signatera:  06/12/2022: Positive (0.52) 09/02/2022: Positive (68.11) CT CAP 07/10/2022: No metastatic disease.  Tiny pulmonary nodules nonspecific. The rise in the Signatera testing is concerning for progression of disease.   Ibrance toxicities: Tolerating 75 mg dose extremely well. Recurrent headaches: Patient had a brain MRI in January 2024 which showed some thalamic hemorrhages   CT CAP 04/02/2023: New peripherally sclerotic lesion superior endplate L2 suspicious for metastases versus Schmorl's node  I discussed with her that we do not change treatment plans based upon Signatera testing.  The CT scans did not show any significant worsening of her condition and therefore I recommended continuing the Ibrance for 3 more months and  repeat another scan and follow-up.

## 2023-04-05 NOTE — Assessment & Plan Note (Signed)
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 systemic chemotherapy with CMF x6 cycles completed 01/30/2022 2. adjuvant radiation therapy completing 04/09/2022 3.  For the continued antiestrogen therapy (previously anastrozole started 07/23/2016 switched to letrozole 2018 switched to tamoxifen 2018, switched back to anastrozole) hair loss was the main problem, may consider exemestane ------------------------------------------------------------------------------------------------------------------------------- Current treatment: Letrozole with Ibrance.  Patient could not tolerate Verzinio Signatera:  06/12/2022: Positive (0.52) 09/02/2022: Positive (68.11) Mar 22, 2023: Positive (106.54)  CT CAP 07/10/2022: No metastatic disease.  Tiny pulmonary nodules nonspecific. CT CAP 04/02/2023: New peripherally sclerotic lesion superior endplate L2 suspicious for treated metastasis or Schmorl's node.  No other sites of metastatic disease identified  The rise in the Signatera testing is concerning for progression of disease.   Ibrance toxicities: Tolerating 75 mg dose extremely well. Given the fact that there is no clear-cut radiological evidence of progression I recommended continuation of the same treatment and rechecking in 3 months.

## 2023-04-08 ENCOUNTER — Other Ambulatory Visit (HOSPITAL_COMMUNITY): Payer: Self-pay

## 2023-04-13 ENCOUNTER — Other Ambulatory Visit (HOSPITAL_COMMUNITY): Payer: Self-pay

## 2023-04-19 ENCOUNTER — Ambulatory Visit (INDEPENDENT_AMBULATORY_CARE_PROVIDER_SITE_OTHER): Payer: Medicare HMO | Admitting: Nurse Practitioner

## 2023-04-19 ENCOUNTER — Encounter: Payer: Self-pay | Admitting: Nurse Practitioner

## 2023-04-19 VITALS — BP 136/78 | HR 88 | Temp 97.6°F | Ht 63.0 in | Wt 204.8 lb

## 2023-04-19 DIAGNOSIS — E782 Mixed hyperlipidemia: Secondary | ICD-10-CM | POA: Diagnosis not present

## 2023-04-19 DIAGNOSIS — Z170 Estrogen receptor positive status [ER+]: Secondary | ICD-10-CM

## 2023-04-19 DIAGNOSIS — C50412 Malignant neoplasm of upper-outer quadrant of left female breast: Secondary | ICD-10-CM

## 2023-04-19 DIAGNOSIS — N184 Chronic kidney disease, stage 4 (severe): Secondary | ICD-10-CM

## 2023-04-19 DIAGNOSIS — I10 Essential (primary) hypertension: Secondary | ICD-10-CM

## 2023-04-19 DIAGNOSIS — F5101 Primary insomnia: Secondary | ICD-10-CM | POA: Diagnosis not present

## 2023-04-19 DIAGNOSIS — N3281 Overactive bladder: Secondary | ICD-10-CM

## 2023-04-19 DIAGNOSIS — M1A0420 Idiopathic chronic gout, left hand, without tophus (tophi): Secondary | ICD-10-CM

## 2023-04-19 MED ORDER — GEMTESA 75 MG PO TABS: 75.0000 mg | ORAL_TABLET | Freq: Every day | ORAL | 1 refills | Status: DC

## 2023-04-19 MED ORDER — PREDNISONE 10 MG (21) PO TBPK: ORAL_TABLET | ORAL | 0 refills | Status: DC

## 2023-04-19 NOTE — Progress Notes (Signed)
Careteam: Patient Care Team: Sharon Seller, NP as PCP - General (Geriatric Medicine) Runell Gess, MD as PCP - Cardiology (Cardiology) Serena Croissant, MD as Consulting Physician (Oncology) Harriette Bouillon, MD as Consulting Physician (General Surgery)  PLACE OF SERVICE:  [facility]  Advanced Directive information    No Known Allergies  Chief Complaint  Patient presents with   Medical Management of Chronic Issues    Patient presents today for a 6 month follow-up   Quality Metric Gaps    AWV, Hep C screening, TDAP     HPI: Patient is a 67 y.o. female for routine follow up Reports she can not sleep due to increase in frequency with urination. Happens mostly at night. Has been going on the last 2 months. Has not tried medication for overactive bladder . She is drinking water throughout the day.  Eats dinner at 2 pm and then goes to bed around 10-11 pm.  She has limited her water prior to bed.  She does not wish to be on a medication forever.   She does not need anything for sleep- bladder is the biggest issue  She now has an area on her back - oncologist plans to further evaluate to see if this is cancer. Having a biopsy to L2 due to new lesion.   Having recurrent pain to base of left thumb. Having some swelling and heat.  Has been going on a few months.  Still able to do the things she needs but hurts and has gotten worse and now swelling  Review of Systems:  Review of Systems  Constitutional:  Negative for chills, fever and weight loss.  HENT:  Negative for tinnitus.   Respiratory:  Negative for cough, sputum production and shortness of breath.   Cardiovascular:  Negative for chest pain, palpitations and leg swelling.  Gastrointestinal:  Negative for abdominal pain, constipation, diarrhea and heartburn.  Genitourinary:  Positive for frequency. Negative for dysuria and urgency.  Musculoskeletal:  Positive for joint pain. Negative for back pain, falls and  myalgias.  Skin: Negative.   Neurological:  Negative for dizziness and headaches.  Psychiatric/Behavioral:  Negative for depression and memory loss. The patient does not have insomnia.    Past Medical History:  Diagnosis Date   Anemia    past hx many yrs ago    Arthritis    lt knee   Breast cancer ([name])    Breast cancer of 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) 04/02/2016   Chronic kidney disease    CKD   Colon polyps    COPD (chronic obstructive pulmonary disease) ([name])    GERD (gastroesophageal reflux disease)    years ago- not current    Hyperlipidemia    Hypertension    Multiple sclerosis ([name])    Personal history of chemotherapy    Personal history of radiation therapy    Stroke ([name])    2013, mild cognitive deficits   Vision abnormalities    Past Surgical History:  Procedure Laterality Date   BREAST BIOPSY Left 03/31/2016   BREAST BIOPSY Right 07/24/2021   BREAST LUMPECTOMY Left 04/17/2016   BREAST LUMPECTOMY Right 09/02/2021   BREAST LUMPECTOMY WITH RADIOACTIVE SEED AND SENTINEL LYMPH NODE BIOPSY Right 09/02/2021   Procedure: RIGHT BREAST LUMPECTOMY WITH RADIOACTIVE SEED AND SENTINEL LYMPH NODE BIOPSY;  Surgeon: Harriette Bouillon, MD;  Location: [facility];  Service: General;  Laterality: Right;   COLONOSCOPY     HYSTEROSCOPY WITH D &  C N/A 12/19/2015   Procedure: DILATATION AND CURETTAGE /HYSTEROSCOPY;  Surgeon: Mitchel Honour, DO;  Location: [facility];  Service: Gynecology;  Laterality: N/A;   POLYPECTOMY     PORTACATH PLACEMENT Right 10/16/2021   Procedure: INSERTION PORT-A-CATH;  Surgeon: Harriette Bouillon, MD;  Location: [facility];  Service: General;  Laterality: Right;   RADIOACTIVE SEED GUIDED PARTIAL MASTECTOMY WITH AXILLARY SENTINEL LYMPH NODE BIOPSY Left 04/17/2016   Procedure: RADIOACTIVE SEED GUIDED PARTIAL MASTECTOMY WITH AXILLARY SENTINEL LYMPH NODE BIOPSY;  Surgeon: Harriette Bouillon, MD;  Location: [facility];  Service: General;  Laterality: Left;  RADIOACTIVE SEED GUIDED PARTIAL MASTECTOMY WITH AXILLARY SENTINEL LYMPH NODE BIOPSY    TUBAL LIGATION     VAGINAL DELIVERY     x3   Social History:   reports that she quit smoking about 18 years ago. Her smoking use included cigarettes. She has a 12.50 pack-year smoking history. She has never used smokeless tobacco. She reports that she does not currently use alcohol. She reports that she does not use drugs.  Family History  Problem Relation Age of Onset   Hypertension Mother    Stroke Mother    Stroke Sister    Alcohol abuse Brother    HIV/AIDS Brother    Breast cancer Cousin        dx 44s   Colon cancer Neg Hx    Colon polyps Neg Hx    Esophageal cancer Neg Hx    Rectal cancer Neg Hx    Stomach cancer Neg Hx     Medications: Patient's Medications  New Prescriptions   No medications on file  Previous Medications   ASPIRIN EC 81 MG TABLET    Take 1 tablet (81 mg total) by mouth daily.   ATORVASTATIN (LIPITOR) 20 MG TABLET    Take 1 tablet (20 mg total) by mouth daily.   LABETALOL (NORMODYNE) 300 MG TABLET    TAKE 2 TABLETS THREE TIMES DAILY   LETROZOLE (FEMARA) 2.5 MG TABLET    Take 1 tablet (2.5 mg total) by mouth daily.   NIFEDIPINE (PROCARDIA XL/NIFEDICAL XL) 60 MG 24 HR TABLET    Take 1 tablet (60 mg total) by mouth 2 (two) times daily.   PALBOCICLIB (IBRANCE) 75 MG TABLET    Take 1 tablet (75 mg total) by mouth daily. Take for 21 days on, 7 days off, repeat every 28 days.  Modified Medications   No medications on file  Discontinued Medications   No medications on file    Physical Exam:  Vitals:   04/19/23 1230  BP: 136/78  Pulse: 88  Temp: 97.6 F (36.4 C)  SpO2: 98%  Weight: 204 lb 12.8 oz (92.9 kg)  Height: 5\' 3"  (1.6 m)   Body mass index is 36.28 kg/m. Wt Readings from Last 3 Encounters:  04/19/23 204 lb 12.8 oz (92.9 kg)  04/05/23 202 lb 8 oz (91.9 kg)  03/02/23 200 lb 3.2 oz (90.8 kg)    Physical  Exam Constitutional:      General: She is not in acute distress.    Appearance: She is well-developed. She is not diaphoretic.  HENT:     Head: Normocephalic and atraumatic.     Mouth/Throat:     Pharynx: No oropharyngeal exudate.  Eyes:     Conjunctiva/sclera: Conjunctivae normal.     Pupils: Pupils are equal, round, and reactive to light.  Cardiovascular:     Rate and Rhythm: Normal rate and regular rhythm.  Heart sounds: Normal heart sounds.  Pulmonary:     Effort: Pulmonary effort is normal.     Breath sounds: Normal breath sounds.  Abdominal:     General: Bowel sounds are normal.     Palpations: Abdomen is soft.  Musculoskeletal:     Cervical back: Normal range of motion and neck supple.     Right lower leg: No edema.     Left lower leg: No edema.  Skin:    General: Skin is warm and dry.  Neurological:     Mental Status: She is alert.  Psychiatric:        Mood and Affect: Mood normal.     Labs reviewed: Basic Metabolic Panel: Recent Labs    12/07/22 1215 12/28/22 1153 03/02/23 1236  NA 142 140 143  K 3.4* 3.9 3.8  CL 108 106 107  CO2 25 26 27   GLUCOSE 107* 112* 125*  BUN 20 23 22   CREATININE 1.78* 1.89* 2.21*  CALCIUM 9.5 9.8 9.5   Liver Function Tests: Recent Labs    12/07/22 1215 12/28/22 1153 03/02/23 1236  AST 25 21 18   ALT 17 15 11   ALKPHOS 74 93 86  BILITOT 0.5 0.4 0.4  PROT 6.8 7.2 7.3  ALBUMIN 4.4 4.5 4.4   No results for input(s): "LIPASE", "AMYLASE" in the last 8760 hours. No results for input(s): "AMMONIA" in the last 8760 hours. CBC: Recent Labs    12/07/22 1215 12/28/22 1153 03/02/23 1236  WBC 4.0 3.2* 3.4*  NEUTROABS 2.8 1.7 2.2  HGB 11.3* 10.7* 10.7*  HCT 32.2* 30.8* 30.4*  MCV 95.3 97.2 100.7*  PLT 320 216 353   Lipid Panel: Recent Labs    06/05/22 1329  CHOL 161  HDL 69  LDLCALC 78  TRIG 68  CHOLHDL 2.3   TSH: No results for input(s): "TSH" in the last 8760 hours. A1C: Lab Results  Component Value Date    HGBA1C 5.8 (H) 05/13/2020     Assessment/Plan 1. Overactive bladder Worse at night interfering with sleep.  Will start gemtesa daily - Vibegron (GEMTESA) 75 MG TABS; Take 1 tablet (75 mg total) by mouth daily.  Dispense: 30 tablet; Refill: 1  2. Essential hypertension -Blood pressure well controlled, goal bp <140/90 Continue current medications and dietary modifications follow metabolic panel  3. Primary insomnia Worse due to OAB, will start gemtesa at this time  4. Malignant neoplasm of upper-outer quadrant of left breast in female, estrogen receptor positive ([name]) Ongoing follow up with oncology  5. Chronic gout of left hand, unspecified cause - Uric Acid - predniSONE (STERAPRED UNI-PAK 21 TAB) 10 MG (21) TBPK tablet; Use as directed  Dispense: 21 tablet; Refill: 0  6. CKD (chronic kidney disease) stage 4, GFR 15-29 ml/min ([name]) Progressively worse Cr over the last year Encourage proper hydration Avoid nephrotoxic meds (NSAIDS) - Ambulatory referral to Nephrology at this time  7. Mixed hyperlipidemia -continues on lipitor.  - Lipid panel   Return in about 6 months (around 10/19/2023) for routine follow up.  [name]. Biagio Borg [facility] [phone]

## 2023-04-20 ENCOUNTER — Other Ambulatory Visit: Payer: Self-pay | Admitting: Nurse Practitioner

## 2023-04-20 DIAGNOSIS — M1A0420 Idiopathic chronic gout, left hand, without tophus (tophi): Secondary | ICD-10-CM

## 2023-04-20 LAB — LIPID PANEL
Cholesterol: 150 mg/dL (ref <200)
HDL: 75 mg/dL (ref >=50)
LDL Cholesterol (Calc): 57 mg/dL (calc)
Non-HDL Cholesterol (Calc): 75 mg/dL (calc) (ref <130)
Total CHOL/HDL Ratio: 2 (calc) (ref <5.0)
Triglycerides: 92 mg/dL (ref <150)

## 2023-04-20 LAB — URIC ACID: Uric Acid, Serum: 8.9 mg/dL — ABNORMAL HIGH (ref 2.5–7.0)

## 2023-04-20 MED ORDER — ALLOPURINOL 100 MG PO TABS: 50.0000 mg | ORAL_TABLET | ORAL | 1 refills | Status: DC

## 2023-05-03 ENCOUNTER — Other Ambulatory Visit: Payer: Self-pay

## 2023-05-03 ENCOUNTER — Emergency Department (HOSPITAL_COMMUNITY)
Admission: EM | Admit: 2023-05-03 | Discharge: 2023-05-03 | Disposition: A | Discharge status: Home / Self Care | Payer: Medicare HMO | Attending: Emergency Medicine | Admitting: Emergency Medicine

## 2023-05-03 ENCOUNTER — Encounter (HOSPITAL_COMMUNITY): Payer: Self-pay

## 2023-05-03 ENCOUNTER — Emergency Department (HOSPITAL_COMMUNITY): Payer: Medicare HMO

## 2023-05-03 DIAGNOSIS — I10 Essential (primary) hypertension: Secondary | ICD-10-CM | POA: Diagnosis not present

## 2023-05-03 DIAGNOSIS — Z79899 Other long term (current) drug therapy: Secondary | ICD-10-CM | POA: Insufficient documentation

## 2023-05-03 DIAGNOSIS — Z7982 Long term (current) use of aspirin: Secondary | ICD-10-CM | POA: Diagnosis not present

## 2023-05-03 DIAGNOSIS — R059 Cough, unspecified: Secondary | ICD-10-CM | POA: Diagnosis not present

## 2023-05-03 DIAGNOSIS — U071 COVID-19: Secondary | ICD-10-CM | POA: Insufficient documentation

## 2023-05-03 LAB — CBC WITH DIFFERENTIAL/PLATELET
Abs Immature Granulocytes: 0.02 10*3/uL (ref 0.00–0.07)
Basophils Absolute: 0 10*3/uL (ref 0.0–0.1)
Basophils Relative: 1 %
Eosinophils Absolute: 0 10*3/uL (ref 0.0–0.5)
Eosinophils Relative: 0 %
HCT: 32.7 % — ABNORMAL LOW (ref 36.0–46.0)
Hemoglobin: 11 g/dL — ABNORMAL LOW (ref 12.0–15.0)
Immature Granulocytes: 1 %
Lymphocytes Relative: 16 %
Lymphs Abs: 0.5 10*3/uL — ABNORMAL LOW (ref 0.7–4.0)
MCH: 34.5 pg — ABNORMAL HIGH (ref 26.0–34.0)
MCHC: 33.6 g/dL (ref 30.0–36.0)
MCV: 102.5 fL — ABNORMAL HIGH (ref 80.0–100.0)
Monocytes Absolute: 0.3 10*3/uL (ref 0.1–1.0)
Monocytes Relative: 8 %
Neutro Abs: 2.3 10*3/uL (ref 1.7–7.7)
Neutrophils Relative %: 74 %
Platelets: 261 10*3/uL (ref 150–400)
RBC: 3.19 MIL/uL — ABNORMAL LOW (ref 3.87–5.11)
RDW: 13 % (ref 11.5–15.5)
WBC: 3.1 10*3/uL — ABNORMAL LOW (ref 4.0–10.5)
nRBC: 0 % (ref 0.0–0.2)

## 2023-05-03 LAB — COMPREHENSIVE METABOLIC PANEL
ALT: 18 U/L (ref 0–44)
AST: 27 U/L (ref 15–41)
Albumin: 4 g/dL (ref 3.5–5.0)
Alkaline Phosphatase: 76 U/L (ref 38–126)
Anion gap: 12 (ref 5–15)
BUN: 22 mg/dL (ref 8–23)
CO2: 22 mmol/L (ref 22–32)
Calcium: 9.3 mg/dL (ref 8.9–10.3)
Chloride: 105 mmol/L (ref 98–111)
Creatinine, Ser: 2.38 mg/dL — ABNORMAL HIGH (ref 0.44–1.00)
GFR, Estimated: 22 mL/min — ABNORMAL LOW (ref >60)
Glucose, Bld: 94 mg/dL (ref 70–99)
Potassium: 4.3 mmol/L (ref 3.5–5.1)
Sodium: 139 mmol/L (ref 135–145)
Total Bilirubin: 1.1 mg/dL (ref 0.3–1.2)
Total Protein: 7.6 g/dL (ref 6.5–8.1)

## 2023-05-03 LAB — SARS CORONAVIRUS 2 BY RT PCR: SARS Coronavirus 2 by RT PCR: POSITIVE — AB

## 2023-05-03 LAB — CK: Total CK: 118 U/L (ref 38–234)

## 2023-05-03 MED ORDER — LACTATED RINGERS IV BOLUS
1000.0000 mL | Freq: Once | INTRAVENOUS | Status: AC
  Administered 2023-05-03: 1000 mL via INTRAVENOUS

## 2023-05-03 NOTE — ED Notes (Signed)
ED Provider at bedside. 

## 2023-05-03 NOTE — ED Provider Notes (Signed)
[place] EMERGENCY DEPARTMENT AT [facility] Provider Note   CSN: [id number] Arrival date & time: 05/03/23  4098     History  Chief Complaint  Patient presents with   Fatigue   Diarrhea   Cough    Michele Page is a 67 y.o. female.  HPI Adult female with multiple medical issues including multiple sclerosis, hypertension, prior stroke who is 3 days of myalgia, cough.  No sick contacts, no obvious precipitant, since onset symptoms have been persistent. She describes diffuse discomfort, particularly with activity.  No focal chest pain, no syncope, no fever.  She does have nausea, anorexia, diarrhea, but no abdominal pain.    Home Medications Prior to Admission medications   Medication Sig Start Date End Date Taking? Authorizing Provider  allopurinol (ZYLOPRIM) 100 MG tablet Take 0.5 tablets (50 mg total) by mouth every other day. 04/20/23   Sharon Seller, NP  aspirin EC 81 MG tablet Take 1 tablet (81 mg total) by mouth daily. 03/01/15   Kirt Boys, DO  atorvastatin (LIPITOR) 20 MG tablet Take 1 tablet (20 mg total) by mouth daily. 01/12/23   Sharon Seller, NP  labetalol (NORMODYNE) 300 MG tablet TAKE 2 TABLETS THREE TIMES DAILY 02/10/22   Sharon Seller, NP  letrozole [facility]) 2.5 MG tablet Take 1 tablet (2.5 mg total) by mouth daily. 06/05/22   Serena Croissant, MD  NIFEdipine (PROCARDIA XL/NIFEDICAL XL) 60 MG 24 hr tablet Take 1 tablet (60 mg total) by mouth 2 (two) times daily. 05/07/22   Sharon Seller, NP  palbociclib (IBRANCE) 75 MG tablet Take 1 tablet (75 mg total) by mouth daily. Take for 21 days on, 7 days off, repeat every 28 days. 03/17/23   Serena Croissant, MD  predniSONE (STERAPRED UNI-PAK 21 TAB) 10 MG (21) TBPK tablet Use as directed 04/19/23   Sharon Seller, NP  Vibegron (GEMTESA) 75 MG TABS Take 1 tablet (75 mg total) by mouth daily. 04/19/23   Sharon Seller, NP      Allergies    Patient has no known allergies.    Review of Systems    Review of Systems  All other systems reviewed and are negative.   Physical Exam Updated Vital Signs BP (!) 165/80 (BP Location: Right Arm)   Pulse (!) 59   Temp 98.6 F (37 C) (Oral)   Resp 19   Ht 5\' 3"  (1.6 m)   Wt 90.3 kg   SpO2 97%   BMI 35.25 kg/m  Physical Exam Vitals and nursing note reviewed.  Constitutional:      General: She is not in acute distress.    Appearance: She is well-developed.  HENT:     Head: Normocephalic and atraumatic.  Eyes:     Conjunctiva/sclera: Conjunctivae normal.  Cardiovascular:     Rate and Rhythm: Normal rate and regular rhythm.     Pulses: Normal pulses.  Pulmonary:     Effort: Pulmonary effort is normal. No respiratory distress.     Breath sounds: No stridor.  Abdominal:     General: There is no distension.  Skin:    General: Skin is warm and dry.  Neurological:     Mental Status: She is alert and oriented to person, place, and time.     Cranial Nerves: No cranial nerve deficit.  Psychiatric:        Mood and Affect: Mood normal.     ED Results / Procedures / Treatments   Labs (  all labs ordered are listed, but only abnormal results are displayed) Labs Reviewed  SARS CORONAVIRUS 2 BY RT PCR - Abnormal; Notable for the following components:      Result Value   SARS Coronavirus 2 by RT PCR POSITIVE (*)    All other components within normal limits  COMPREHENSIVE METABOLIC PANEL - Abnormal; Notable for the following components:   Creatinine, Ser 2.38 (*)    GFR, Estimated 22 (*)    All other components within normal limits  CBC WITH DIFFERENTIAL/PLATELET - Abnormal; Notable for the following components:   WBC 3.1 (*)    RBC 3.19 (*)    Hemoglobin 11.0 (*)    HCT 32.7 (*)    MCV 102.5 (*)    MCH 34.5 (*)    Lymphs Abs 0.5 (*)    All other components within normal limits  CK  URINALYSIS, ROUTINE W REFLEX MICROSCOPIC    EKG EKG Interpretation  Date/Time:  Monday May 03 2023 10:10:05 EDT Ventricular Rate:  74 PR  Interval:  156 QRS Duration: 85 QT Interval:  412 QTC Calculation: 458 R Axis:   20 Text Interpretation: Sinus rhythm Low voltage, precordial leads Confirmed by Gerhard Munch [phone]) on 05/03/2023 11:32:31 AM  Radiology DG Chest 2 View  Result Date: 05/03/2023 CLINICAL DATA:  cough, myalgia EXAM: CHEST - 2 VIEW COMPARISON:  CXR 10/16/21 FINDINGS: No pleural effusion. No pneumothorax. No focal airspace opacity. Normal cardiac and mediastinal contours. Surgical clips in the right axilla. Degenerative changes of the right glenohumeral joint. Visualized upper abdomen is unremarkable. Vertebral body heights are maintained. IMPRESSION: No focal airspace opacity Electronically Signed   By: Lorenza Cambridge M.D.   On: 05/03/2023 11:04    Procedures Procedures    Medications Ordered in ED Medications  lactated ringers bolus 1,000 mL (1,000 mLs Intravenous New Bag/Given 05/03/23 1144)    ED Course/ Medical Decision Making/ A&P                             Medical Decision Making Adult female with multiple medical issues, including MS, hypertension, prior stroke now presents with fatigue, myalgia, pain with activity and cough.  Broad differential including medication reaction, viral syndrome, bacteremia, sepsis, pneumonia.  Cardiac 75 sinus normal Pulse ox 98% room air normal   Amount and/or Complexity of Data Reviewed External Data Reviewed: notes. Labs: ordered. Decision-making details documented in ED Course. Radiology: ordered and independent interpretation performed. Decision-making details documented in ED Course. ECG/medicine tests: ordered and independent interpretation performed. Decision-making details documented in ED Course.  Risk Prescription drug management. Decision regarding hospitalization. Diagnosis or treatment significantly limited by social determinants of health.   12:44 PM Patient awake, alert, sitting upright.  No hypoxia, no tachypnea nor tachycardia.  Labs  reviewed, discussed, x-ray unremarkable for pneumonia but patient is found to have COVID-positive result.  Without increased work of breathing evidence for decompensated state patient appropriate for home management.        Final Clinical Impression(s) / ED Diagnoses Final diagnoses:  COVID    Rx / DC Orders ED Discharge Orders     None         Gerhard Munch, MD 05/03/23 1244

## 2023-05-03 NOTE — ED Triage Notes (Signed)
Pt has been feeling sick since last Friday, poor appetite, generalized weakness/pain, cough and chest congestion, and diarrhea. Pt states she felt so weak she was unable to complete her duties at work and decided to come in.

## 2023-05-03 NOTE — ED Notes (Signed)
Patient unable to get sufficient amount for UA  specimen

## 2023-05-04 ENCOUNTER — Other Ambulatory Visit: Payer: Self-pay | Admitting: Nurse Practitioner

## 2023-05-04 DIAGNOSIS — I10 Essential (primary) hypertension: Secondary | ICD-10-CM

## 2023-05-05 ENCOUNTER — Encounter: Payer: Self-pay | Admitting: Internal Medicine

## 2023-05-05 ENCOUNTER — Other Ambulatory Visit: Payer: Self-pay

## 2023-05-05 ENCOUNTER — Encounter: Payer: Medicare HMO | Admitting: Internal Medicine

## 2023-05-06 ENCOUNTER — Other Ambulatory Visit (HOSPITAL_COMMUNITY): Payer: Self-pay

## 2023-05-06 ENCOUNTER — Other Ambulatory Visit: Payer: Self-pay

## 2023-05-10 ENCOUNTER — Ambulatory Visit (INDEPENDENT_AMBULATORY_CARE_PROVIDER_SITE_OTHER): Payer: Medicare HMO | Admitting: Nurse Practitioner

## 2023-05-10 ENCOUNTER — Encounter: Payer: Self-pay | Admitting: Nurse Practitioner

## 2023-05-10 VITALS — BP 114/70 | HR 87 | Temp 97.1°F | Ht 63.0 in | Wt 196.0 lb

## 2023-05-10 DIAGNOSIS — U071 COVID-19: Secondary | ICD-10-CM | POA: Diagnosis not present

## 2023-05-10 DIAGNOSIS — C50412 Malignant neoplasm of upper-outer quadrant of left female breast: Secondary | ICD-10-CM

## 2023-05-10 DIAGNOSIS — E782 Mixed hyperlipidemia: Secondary | ICD-10-CM

## 2023-05-10 DIAGNOSIS — Z170 Estrogen receptor positive status [ER+]: Secondary | ICD-10-CM | POA: Diagnosis not present

## 2023-05-10 DIAGNOSIS — N3281 Overactive bladder: Secondary | ICD-10-CM | POA: Diagnosis not present

## 2023-05-10 DIAGNOSIS — I10 Essential (primary) hypertension: Secondary | ICD-10-CM | POA: Diagnosis not present

## 2023-05-10 NOTE — Progress Notes (Signed)
Careteam: Patient Care Team: Sharon Seller, NP as PCP - General (Geriatric Medicine) Runell Gess, MD as PCP - Cardiology (Cardiology) Serena Croissant, MD as Consulting Physician (Oncology) Harriette Bouillon, MD as Consulting Physician (General Surgery)  PLACE OF SERVICE:  [facility]  Advanced Directive information    No Known Allergies  Chief Complaint  Patient presents with   Acute Visit    Pos covid follow-up. Patient ststes " Everything is out of wack." Patient is not taking medications due to symptoms related to covid, such as low blood pressure, loss of appetite, dizzy spells (off/on), and weight loss     HPI: Patient is a 67 y.o. female for follow up COVID.  She had a cough and fatigue. She is feeling much better now. Went back to work.  She is concerned her bp has been low so she is not taking her labetaolol or nifedipine.  She has actually stopped quiet a bit of medications She is not feeling dizzines and able to walk around without any effects.  Overall feels good.  No headache, shortness of breath She is not feeling any difference off gemtesa no more OAB symptoms.    Review of Systems:  Review of Systems  Constitutional:  Negative for chills, fever and weight loss.  HENT:  Negative for tinnitus.   Respiratory:  Negative for cough, sputum production and shortness of breath.   Cardiovascular:  Negative for chest pain, palpitations and leg swelling.  Gastrointestinal:  Negative for abdominal pain, constipation, diarrhea and heartburn.  Genitourinary:  Negative for dysuria, frequency and urgency.  Musculoskeletal:  Negative for back pain, falls, joint pain and myalgias.  Skin: Negative.   Neurological:  Negative for dizziness and headaches.  Psychiatric/Behavioral:  Negative for depression and memory loss. The patient does not have insomnia.     Past Medical History:  Diagnosis Date   Anemia    past hx many yrs ago    Arthritis    lt knee   Breast  cancer ([name])    Breast cancer of 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) 04/02/2016   Chronic kidney disease    CKD   Colon polyps    COPD (chronic obstructive pulmonary disease) ([name])    GERD (gastroesophageal reflux disease)    years ago- not current    Hyperlipidemia    Hypertension    Multiple sclerosis ([name])    Personal history of chemotherapy    Personal history of radiation therapy    Stroke ([name])    2013, mild cognitive deficits   Vision abnormalities    Past Surgical History:  Procedure Laterality Date   BREAST BIOPSY Left 03/31/2016   BREAST BIOPSY Right 07/24/2021   BREAST LUMPECTOMY Left 04/17/2016   BREAST LUMPECTOMY Right 09/02/2021   BREAST LUMPECTOMY WITH RADIOACTIVE SEED AND SENTINEL LYMPH NODE BIOPSY Right 09/02/2021   Procedure: RIGHT BREAST LUMPECTOMY WITH RADIOACTIVE SEED AND SENTINEL LYMPH NODE BIOPSY;  Surgeon: Harriette Bouillon, MD;  Location: [facility];  Service: General;  Laterality: Right;   COLONOSCOPY     HYSTEROSCOPY WITH D & C N/A 12/19/2015   Procedure: DILATATION AND CURETTAGE /HYSTEROSCOPY;  Surgeon: Mitchel Honour, DO;  Location: [facility];  Service: Gynecology;  Laterality: N/A;   POLYPECTOMY     PORTACATH PLACEMENT Right 10/16/2021   Procedure: INSERTION PORT-A-CATH;  Surgeon: Harriette Bouillon, MD;  Location: [facility];  Service: General;  Laterality: Right;   RADIOACTIVE SEED GUIDED PARTIAL MASTECTOMY WITH AXILLARY  SENTINEL LYMPH NODE BIOPSY Left 04/17/2016   Procedure: RADIOACTIVE SEED GUIDED PARTIAL MASTECTOMY WITH AXILLARY SENTINEL LYMPH NODE BIOPSY;  Surgeon: Harriette Bouillon, MD;  Location: [facility];  Service: General;  Laterality: Left;  RADIOACTIVE SEED GUIDED PARTIAL MASTECTOMY WITH AXILLARY SENTINEL LYMPH NODE BIOPSY    TUBAL LIGATION     VAGINAL DELIVERY     x3   Social History:   reports that she quit smoking about 18 years ago. Her smoking use included cigarettes. She has a 12.50  pack-year smoking history. She has never used smokeless tobacco. She reports that she does not currently use alcohol. She reports that she does not use drugs.  Family History  Problem Relation Age of Onset   Hypertension Mother    Stroke Mother    Stroke Sister    Alcohol abuse Brother    HIV/AIDS Brother    Breast cancer Cousin        dx 42s   Colon cancer Neg Hx    Colon polyps Neg Hx    Esophageal cancer Neg Hx    Rectal cancer Neg Hx    Stomach cancer Neg Hx     Medications: Patient's Medications  New Prescriptions   No medications on file  Previous Medications   ALLOPURINOL (ZYLOPRIM) 100 MG TABLET    Take 0.5 tablets (50 mg total) by mouth every other day.   ASPIRIN EC 81 MG TABLET    Take 1 tablet (81 mg total) by mouth daily.   ATORVASTATIN (LIPITOR) 20 MG TABLET    Take 1 tablet (20 mg total) by mouth daily.   LABETALOL (NORMODYNE) 300 MG TABLET    TAKE 2 TABLETS THREE TIMES DAILY   LETROZOLE (FEMARA) 2.5 MG TABLET    Take 1 tablet (2.5 mg total) by mouth daily.   NIFEDIPINE (PROCARDIA XL/NIFEDICAL XL) 60 MG 24 HR TABLET    Take 1 tablet (60 mg total) by mouth 2 (two) times daily.   PALBOCICLIB (IBRANCE) 75 MG TABLET    Take 1 tablet (75 mg total) by mouth daily. Take for 21 days on, 7 days off, repeat every 28 days.   VIBEGRON (GEMTESA) 75 MG TABS    Take 1 tablet (75 mg total) by mouth daily.  Modified Medications   No medications on file  Discontinued Medications   PREDNISONE (STERAPRED UNI-PAK 21 TAB) 10 MG (21) TBPK TABLET    Use as directed    Physical Exam:  Vitals:   05/10/23 1138  BP: 114/70  Pulse: 87  Temp: (!) 97.1 F (36.2 C)  TempSrc: Temporal  SpO2: 96%  Weight: 196 lb (88.9 kg)  Height: 5\' 3"  (1.6 m)   Body mass index is 34.72 kg/m. Wt Readings from Last 3 Encounters:  05/10/23 196 lb (88.9 kg)  05/05/23 190 lb (86.2 kg)  05/03/23 199 lb (90.3 kg)    Physical Exam Constitutional:      General: She is not in acute distress.     Appearance: She is well-developed. She is not diaphoretic.  HENT:     Head: Normocephalic and atraumatic.     Mouth/Throat:     Pharynx: No oropharyngeal exudate.  Eyes:     Conjunctiva/sclera: Conjunctivae normal.     Pupils: Pupils are equal, round, and reactive to light.  Cardiovascular:     Rate and Rhythm: Normal rate and regular rhythm.     Heart sounds: Normal heart sounds.  Pulmonary:     Effort: Pulmonary effort is  normal.     Breath sounds: Normal breath sounds.  Abdominal:     General: Bowel sounds are normal.     Palpations: Abdomen is soft.  Musculoskeletal:     Cervical back: Normal range of motion and neck supple.     Right lower leg: No edema.     Left lower leg: No edema.  Skin:    General: Skin is warm and dry.  Neurological:     Mental Status: She is alert.  Psychiatric:        Mood and Affect: Mood normal.     Labs reviewed: Basic Metabolic Panel: Recent Labs    12/28/22 1153 03/02/23 1236 05/03/23 0952  NA 140 143 139  K 3.9 3.8 4.3  CL 106 107 105  CO2 26 27 22   GLUCOSE 112* 125* 94  BUN 23 22 22   CREATININE 1.89* 2.21* 2.38*  CALCIUM 9.8 9.5 9.3   Liver Function Tests: Recent Labs    12/28/22 1153 03/02/23 1236 05/03/23 0952  AST 21 18 27   ALT 15 11 18   ALKPHOS 93 86 76  BILITOT 0.4 0.4 1.1  PROT 7.2 7.3 7.6  ALBUMIN 4.5 4.4 4.0   No results for input(s): "LIPASE", "AMYLASE" in the last 8760 hours. No results for input(s): "AMMONIA" in the last 8760 hours. CBC: Recent Labs    12/28/22 1153 03/02/23 1236 05/03/23 0952  WBC 3.2* 3.4* 3.1*  NEUTROABS 1.7 2.2 2.3  HGB 10.7* 10.7* 11.0*  HCT 30.8* 30.4* 32.7*  MCV 97.2 100.7* 102.5*  PLT 216 353 261   Lipid Panel: Recent Labs    06/05/22 1329 04/19/23 1332  CHOL 161 150  HDL 69 75  LDLCALC 78 57  TRIG 68 92  CHOLHDL 2.3 2.0   TSH: No results for input(s): "TSH" in the last 8760 hours. A1C: Lab Results  Component Value Date   HGBA1C 5.8 (H) 05/13/2020      Assessment/Plan 1. Overactive bladder Has not noticed any worsening of symptoms off gemtesa, will stop at this time.   2. Essential hypertension -well controlled off medication, will continue to hold off on taking any medication for blood pressure but monitor bp daily at this time.  -goal is <140/90.  3. Malignant neoplasm of upper-outer quadrant of left breast in female, estrogen receptor positive ([name]) To restart palbociclib (IBRANCE) 75 MG tablet and Letrazole (femara) 2.5 mg tablet at this time  4. Hyperlipidemia -restart lipitor 20 mg daily   4. COVID Doing well at this time, no ongoing symptoms.    [name]. Biagio Borg [facility] [phone]

## 2023-05-10 NOTE — Patient Instructions (Addendum)
Restart   palbociclib (IBRANCE) 75 MG tablet  Letrazole (femara) 2.5 mg tabletto re Lipitor 20 mg daily  HOLD off on starting nifedipine and labetalol  IF blood pressure starts going up, (goal <140/90) Restart nifedipine then restart labetalol  Continue to stay well hydrated.

## 2023-05-11 ENCOUNTER — Other Ambulatory Visit (HOSPITAL_COMMUNITY): Payer: Self-pay

## 2023-05-12 NOTE — Progress Notes (Signed)
A user error has taken place.

## 2023-05-13 ENCOUNTER — Encounter: Payer: Self-pay | Admitting: Hematology and Oncology

## 2023-05-14 IMAGING — MG MM DIGITAL DIAGNOSTIC UNILAT*R* W/ TOMO W/ CAD
8 series · 8 of 24 positions shown · non-contrast
Comparison: Previous exams including most recent annual bilateral
mammogram dated 12/09/2020.

CLINICAL DATA: Patient describes a new palpable lump within the
RIGHT breast. History of LEFT breast cancer in 5609 status post
lumpectomy.

EXAM:
DIGITAL DIAGNOSTIC UNILATERAL RIGHT MAMMOGRAM WITH TOMOSYNTHESIS AND
CAD; ULTRASOUND RIGHT BREAST LIMITED
TECHNIQUE: Right digital diagnostic mammography and breast tomosynthesis was
performed. The images were evaluated with computer-aided detection.;
Targeted ultrasound examination of the right breast was performed

[R MLO synth-2D (1 of 2)]
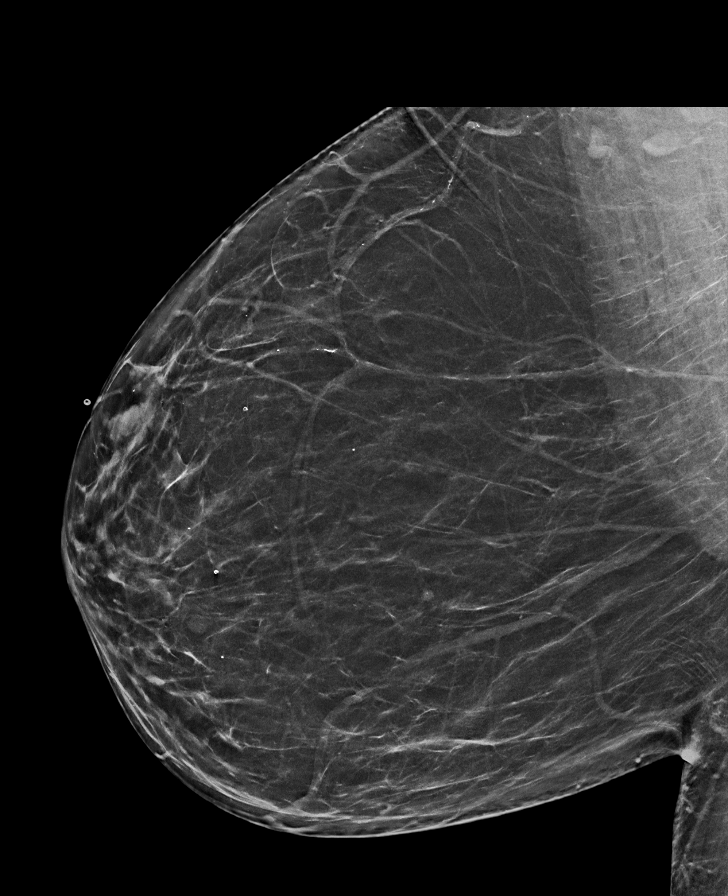

[R CC synth-2D (1 of 2)]
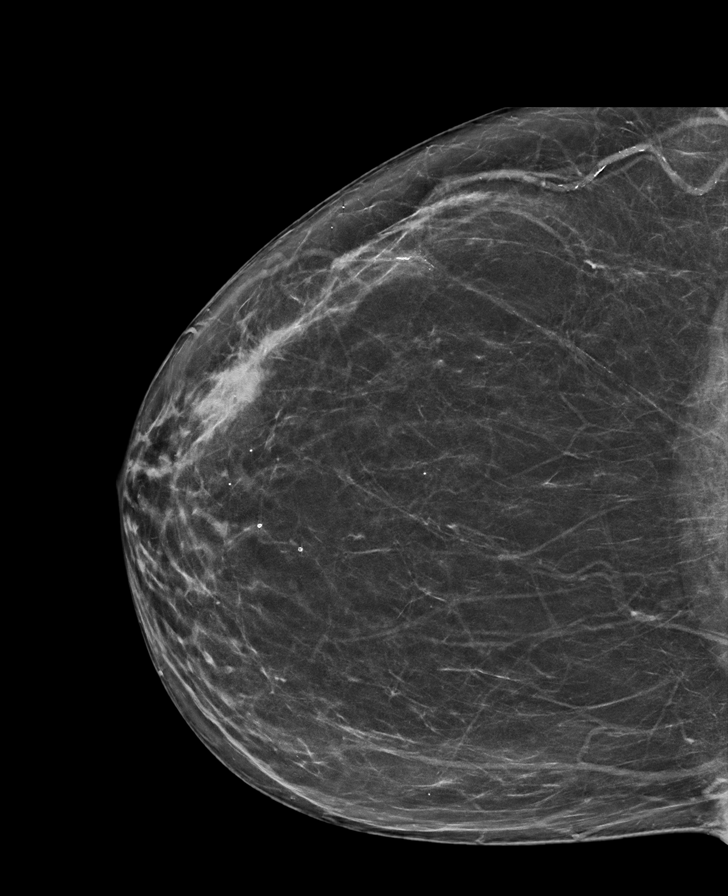

[R CC synth-2D (2 of 2)]
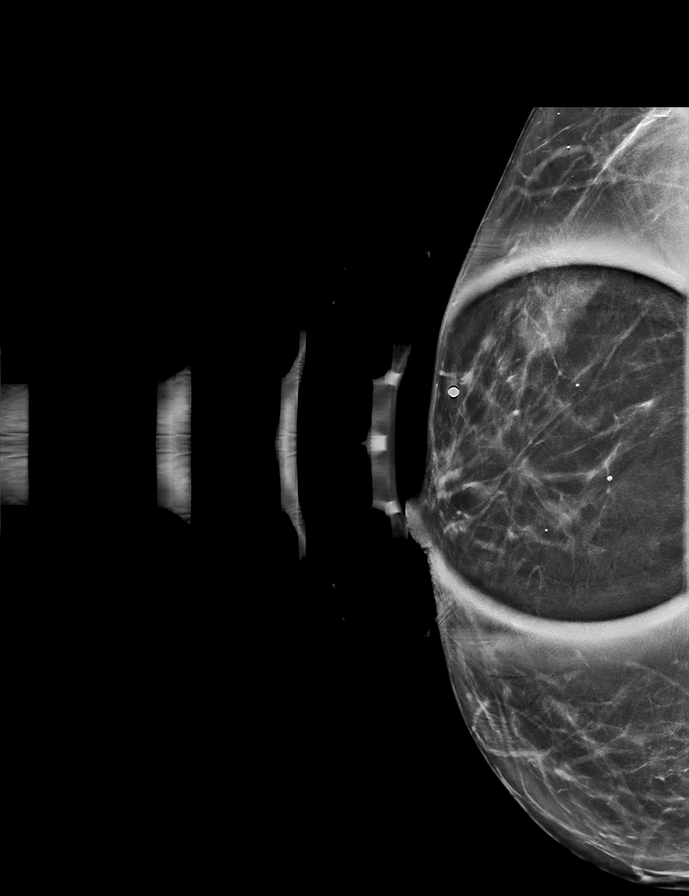

[R MLO synth-2D (2 of 2)]
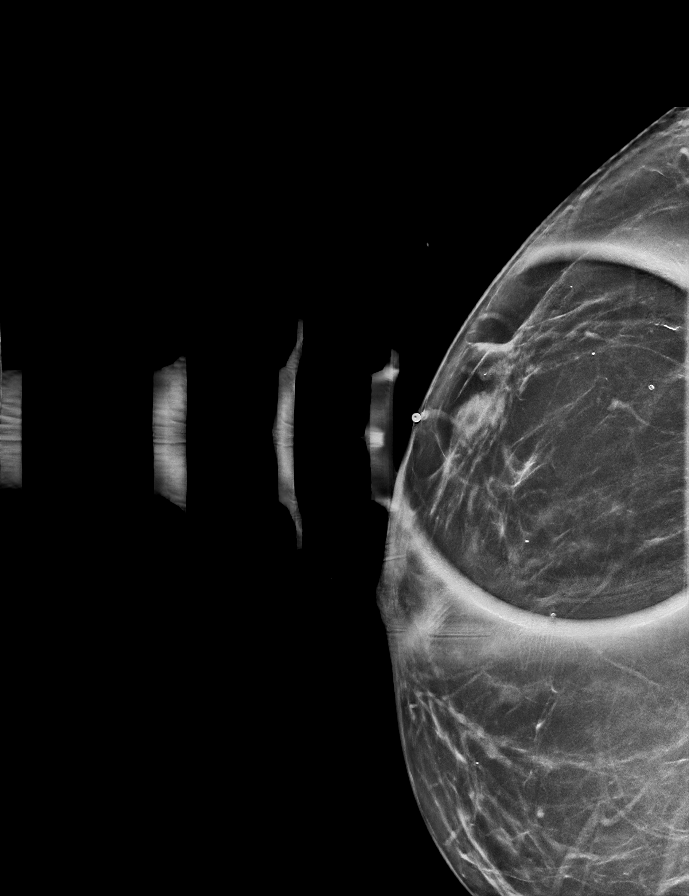

[R MLO tomo (1 of 2) · tomo slice 37/73.0]
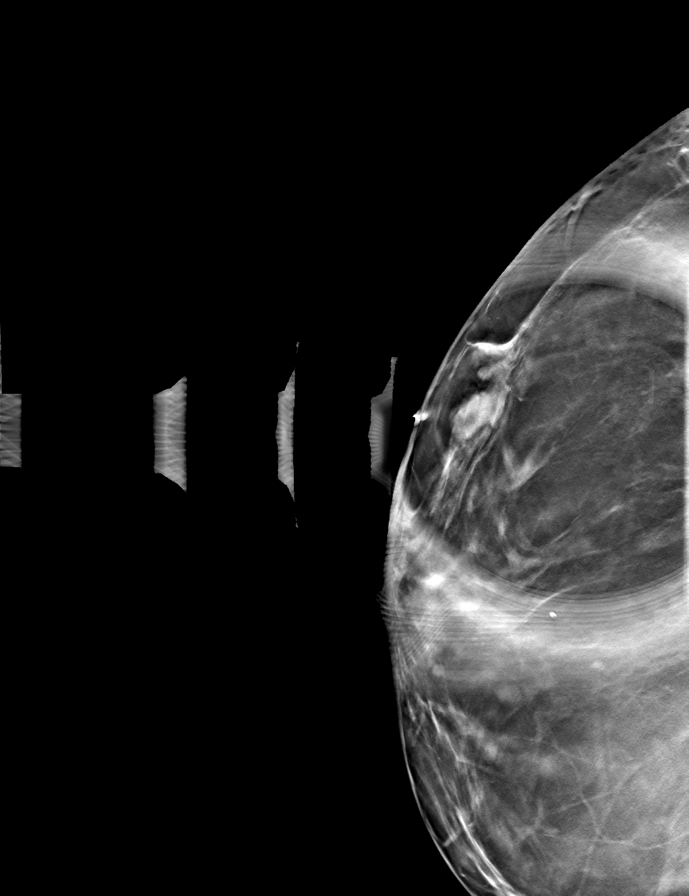

[R CC tomo (1 of 2) · tomo slice 35/69.0]
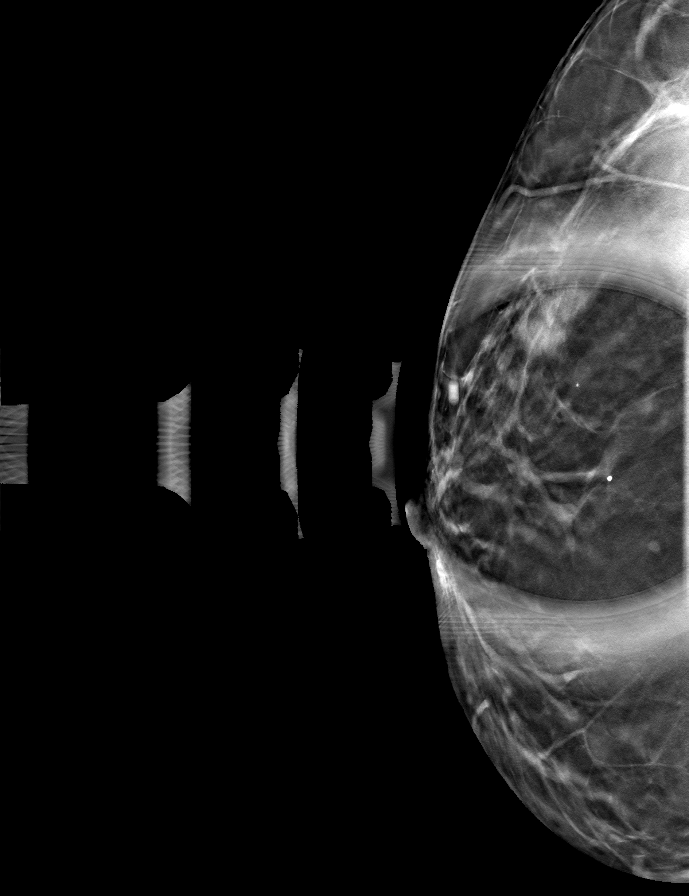

[R MLO tomo (2 of 2) · tomo slice 41/82.0]
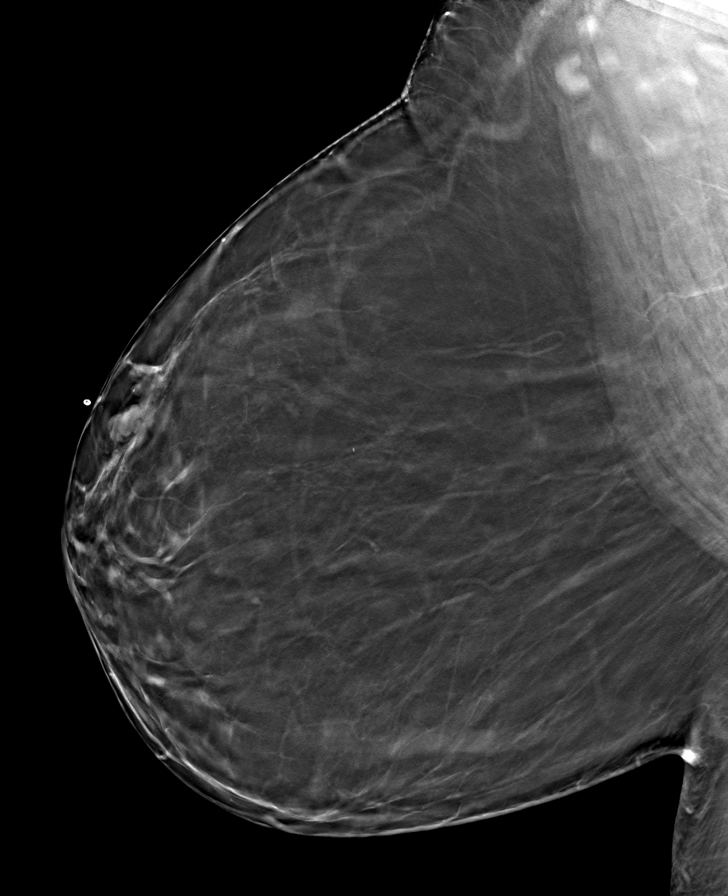

[R CC tomo (2 of 2) · tomo slice 39/78.0]
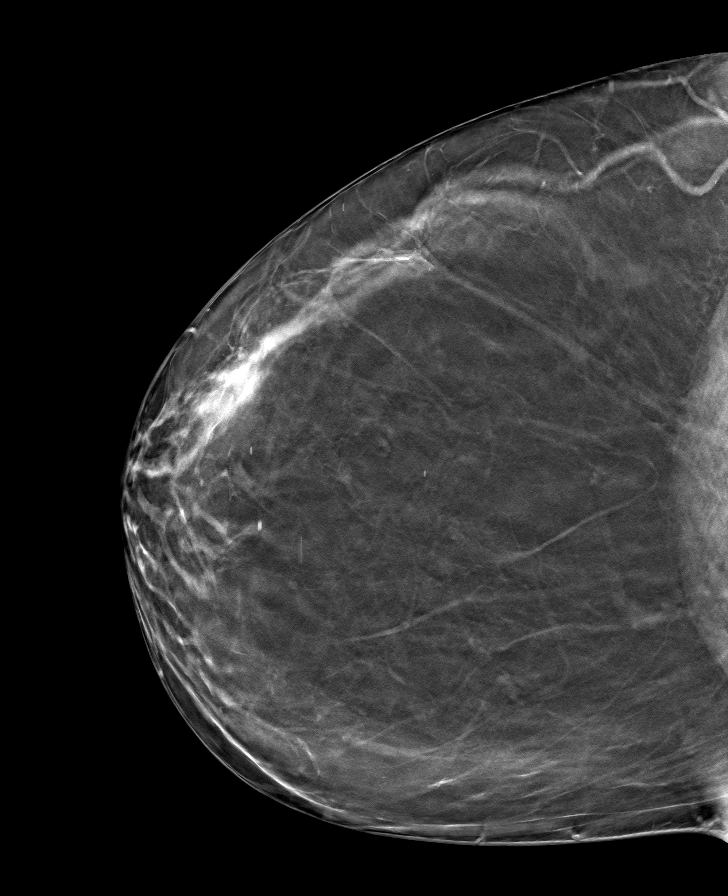

[8 of 24 positions shown; findings below may reference images not displayed]

ACR Breast Density Category b: There are scattered areas of
fibroglandular density.
FINDINGS: There is a new partially obscured mass within the slightly outer
RIGHT breast, corresponding to the palpable area of concern with
overlying skin marker in place.

Targeted ultrasound is performed, showing a hypoechoic mass in the
RIGHT breast at the 11 o'clock axis, 2 cm from the nipple, measuring
2.3 x 0.9 x 1.7 cm, with internal vascularity, corresponding to the
palpable area of concern.

RIGHT axilla was evaluated with ultrasound showing no enlarged or
morphologically abnormal lymph nodes.
IMPRESSION: Suspicious mass within the RIGHT breast at the 11 o'clock axis, 2 cm
from the nipple, measuring 2.3 cm, with prominent internal
vascularity, corresponding to the palpable area of concern.
Ultrasound-guided biopsy is recommended.

RECOMMENDATION:
Ultrasound-guided biopsy of the RIGHT breast mass at the 11 o'clock
axis, 2 cm from the nipple, measuring 2.3 cm.

Ultrasound-guided biopsy is scheduled for [REDACTED].

I have discussed the findings and recommendations with the patient.
If applicable, a reminder letter will be sent to the patient
regarding the next appointment.

BI-RADS CATEGORY  5: Highly suggestive of malignancy.

## 2023-05-14 IMAGING — US US BREAST*R* LIMITED INC AXILLA
1 series · 10 of 10 positions shown · non-contrast
Comparison: Previous exams including most recent annual bilateral
mammogram dated 12/09/2020.

CLINICAL DATA: Patient describes a new palpable lump within the
RIGHT breast. History of LEFT breast cancer in 5609 status post
lumpectomy.

EXAM:
DIGITAL DIAGNOSTIC UNILATERAL RIGHT MAMMOGRAM WITH TOMOSYNTHESIS AND
CAD; ULTRASOUND RIGHT BREAST LIMITED
TECHNIQUE: Right digital diagnostic mammography and breast tomosynthesis was
performed. The images were evaluated with computer-aided detection.;
Targeted ultrasound examination of the right breast was performed

[Series 1: us breast*right* limited inc axilla · 0.07mm/px · 10 of 10 slices shown]
[im 1/10]
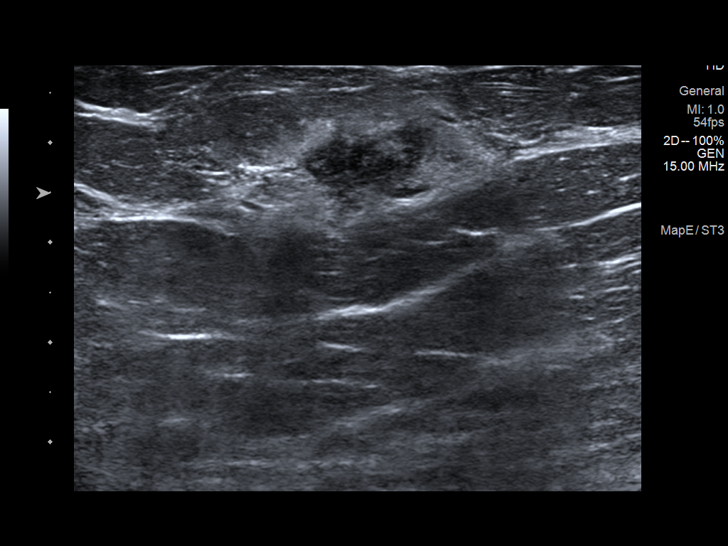
[im 2/10]
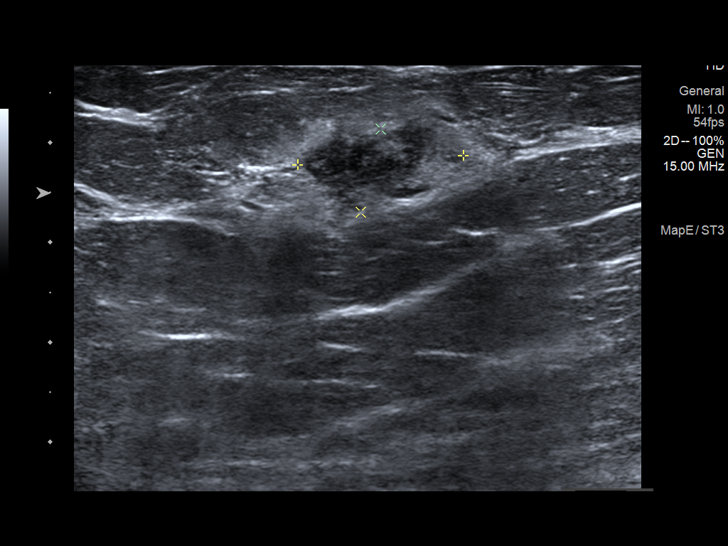
[im 3/10]
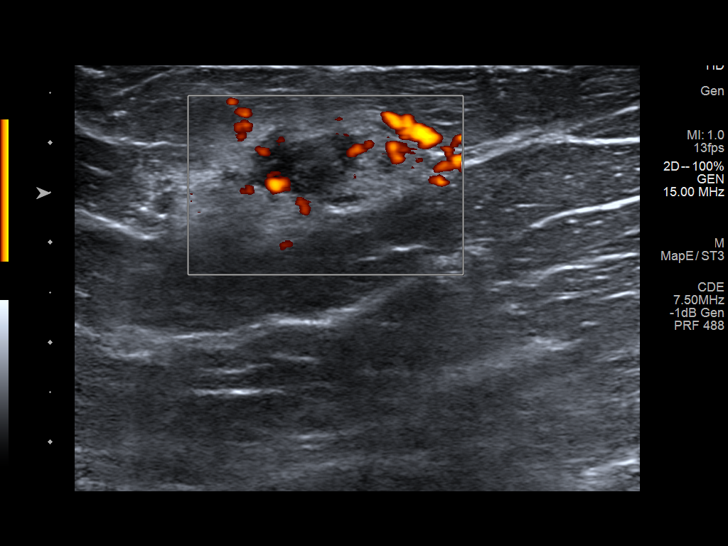
[im 4/10]
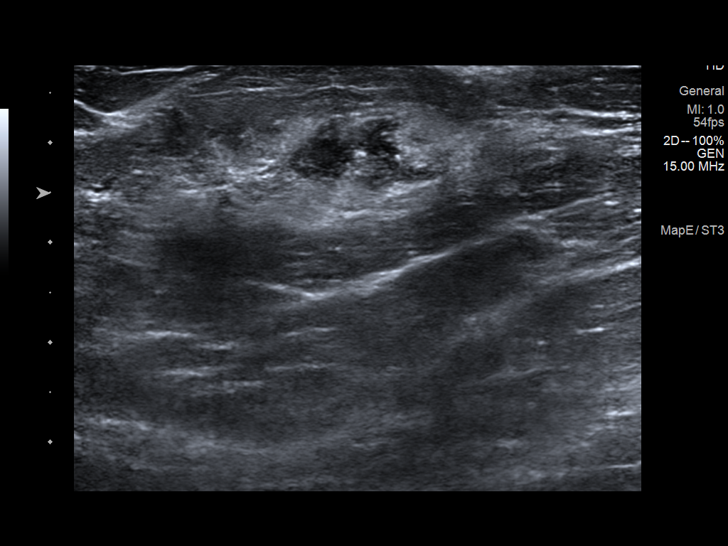
[im 5/10]
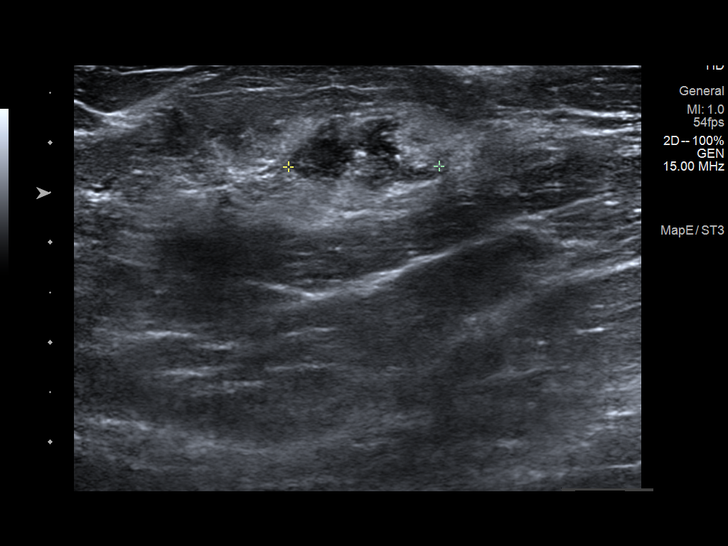
[im 6/10]
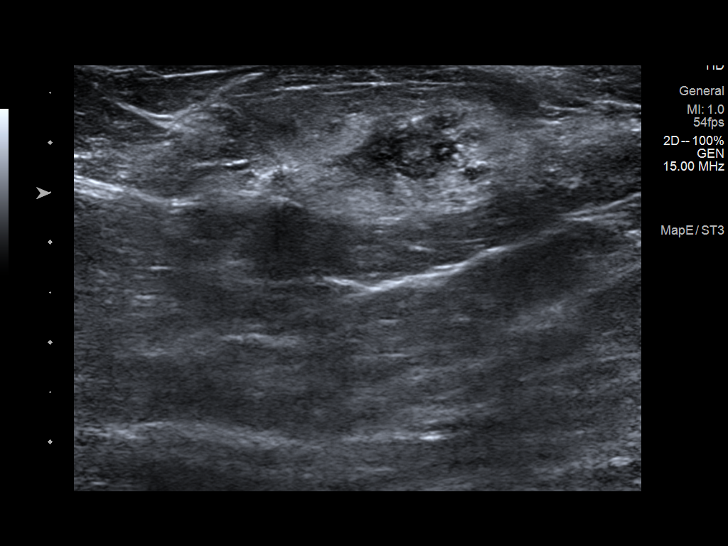
[im 7/10]
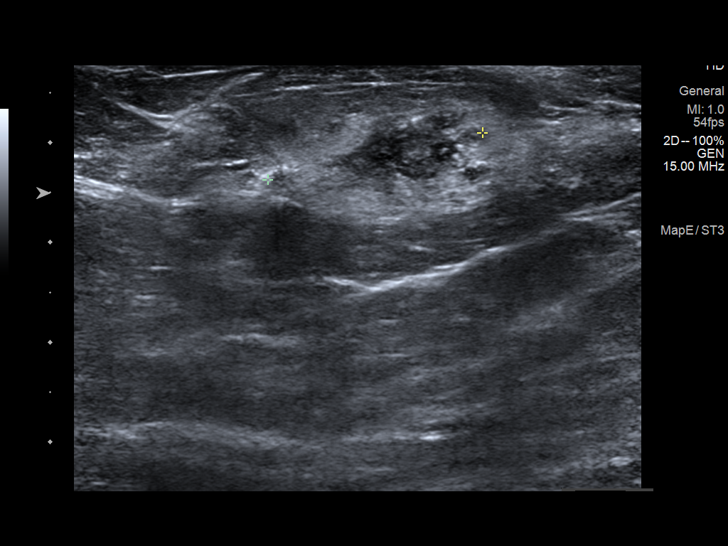
[im 8/10]
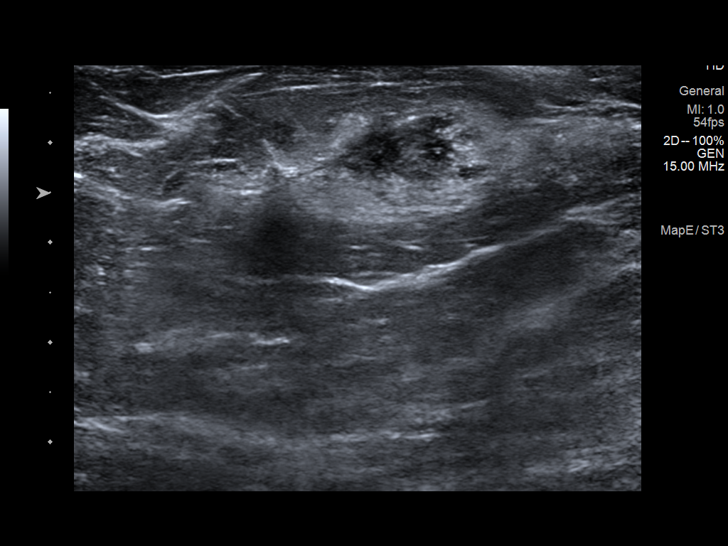
[im 9/10]
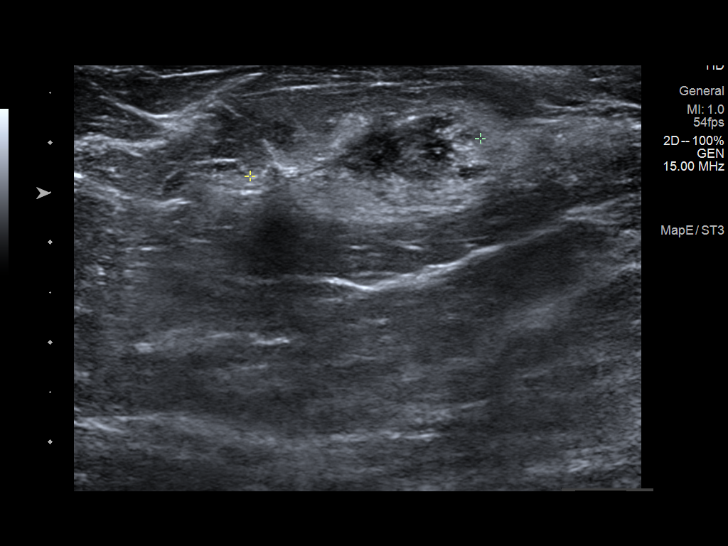
[im 10/10]
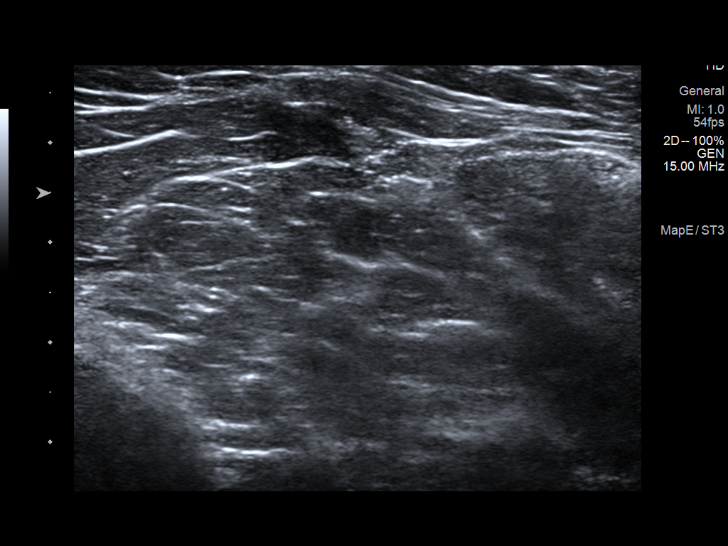

[10 of 10 positions shown; findings below may reference images not displayed]

ACR Breast Density Category b: There are scattered areas of
fibroglandular density.
FINDINGS: There is a new partially obscured mass within the slightly outer
RIGHT breast, corresponding to the palpable area of concern with
overlying skin marker in place.

Targeted ultrasound is performed, showing a hypoechoic mass in the
RIGHT breast at the 11 o'clock axis, 2 cm from the nipple, measuring
2.3 x 0.9 x 1.7 cm, with internal vascularity, corresponding to the
palpable area of concern.

RIGHT axilla was evaluated with ultrasound showing no enlarged or
morphologically abnormal lymph nodes.
IMPRESSION: Suspicious mass within the RIGHT breast at the 11 o'clock axis, 2 cm
from the nipple, measuring 2.3 cm, with prominent internal
vascularity, corresponding to the palpable area of concern.
Ultrasound-guided biopsy is recommended.

RECOMMENDATION:
Ultrasound-guided biopsy of the RIGHT breast mass at the 11 o'clock
axis, 2 cm from the nipple, measuring 2.3 cm.

Ultrasound-guided biopsy is scheduled for [REDACTED].

I have discussed the findings and recommendations with the patient.
If applicable, a reminder letter will be sent to the patient
regarding the next appointment.

BI-RADS CATEGORY  5: Highly suggestive of malignancy.

## 2023-05-17 ENCOUNTER — Other Ambulatory Visit: Payer: Self-pay | Admitting: Nurse Practitioner

## 2023-05-17 ENCOUNTER — Telehealth: Payer: Self-pay

## 2023-05-17 DIAGNOSIS — I10 Essential (primary) hypertension: Secondary | ICD-10-CM

## 2023-05-17 NOTE — Telephone Encounter (Signed)
Patient called and reported that her pharmacy changed the color of her labetalol 300 mg to blue pills and she not taking the medication due to it not been the white pill. She reported calling another pharmacy to get medication from and going to return call with the pharmacy information.

## 2023-05-17 NOTE — Telephone Encounter (Signed)
Patient called and requested labetalol to be send to the [facility] on [address]. medication was send per patient request.

## 2023-05-27 ENCOUNTER — Other Ambulatory Visit: Payer: Self-pay | Admitting: Nurse Practitioner

## 2023-05-27 NOTE — Telephone Encounter (Signed)
Patient has request refill on medication Procardia. Patient medication has High Risk Warnings. Medication pend and sent to PCP [name] [name], NP

## 2023-05-28 ENCOUNTER — Other Ambulatory Visit: Payer: Self-pay

## 2023-05-28 ENCOUNTER — Ambulatory Visit (INDEPENDENT_AMBULATORY_CARE_PROVIDER_SITE_OTHER): Payer: Medicare HMO | Admitting: Nurse Practitioner

## 2023-05-28 ENCOUNTER — Encounter: Payer: Self-pay | Admitting: Nurse Practitioner

## 2023-05-28 VITALS — BP 136/82 | HR 78 | Temp 97.1°F | Ht 63.0 in | Wt 202.0 lb

## 2023-05-28 DIAGNOSIS — Z0000 Encounter for general adult medical examination without abnormal findings: Secondary | ICD-10-CM

## 2023-05-28 DIAGNOSIS — Z170 Estrogen receptor positive status [ER+]: Secondary | ICD-10-CM

## 2023-05-28 NOTE — Progress Notes (Signed)
Subjective:   Michele Page is a 67 y.o. female who presents for Medicare Annual (Subsequent) preventive examination.  Visit Complete: In person at [facility]  Patient Medicare AWV questionnaire was completed by the patient on 05/28/23; I have confirmed that all information answered by patient is correct and no changes since this date.  Review of Systems     Cardiac Risk Factors include: family history of premature cardiovascular disease;advanced age (>34men, >12 women);hypertension;dyslipidemia;obesity (BMI >30kg/m2)     Objective:    Today's Vitals   05/28/23 1446  BP: 136/82  Pulse: 78  Temp: (!) 97.1 F (36.2 C)  TempSrc: Temporal  SpO2: 98%  Weight: 202 lb (91.6 kg)  Height: 5\' 3"  (1.6 m)  PainSc: 0-No pain   Body mass index is 35.78 kg/m.     05/28/2023    2:43 PM 05/05/2023    1:47 PM 05/03/2023    9:38 AM 04/05/2023   10:51 AM 02/04/2023   10:49 AM 12/07/2022    2:20 PM 10/15/2022    2:05 PM  Advanced Directives  Does Patient Have a Medical Advance Directive? No No No No No No No  Would patient like information on creating a medical advance directive? No - Patient declined No - Patient declined No - Guardian declined No - Patient declined No - Patient declined No - Patient declined No - Patient declined    Current Medications (verified) Outpatient Encounter Medications as of 05/28/2023  Medication Sig   allopurinol (ZYLOPRIM) 100 MG tablet Take 0.5 tablets (50 mg total) by mouth every other day.   aspirin EC 81 MG tablet Take 1 tablet (81 mg total) by mouth daily.   atorvastatin (LIPITOR) 20 MG tablet Take 1 tablet (20 mg total) by mouth daily.   labetalol (NORMODYNE) 300 MG tablet TAKE 2 TABLETS BY MOUTH THREE TIMES DAILY   letrozole (FEMARA) 2.5 MG tablet Take 1 tablet (2.5 mg total) by mouth daily.   NIFEdipine (PROCARDIA XL/NIFEDICAL XL) 60 MG 24 hr tablet TAKE 1 TABLET TWICE DAILY   palbociclib (IBRANCE) 75 MG tablet Take 1 tablet (75 mg total) by mouth daily.  Take for 21 days on, 7 days off, repeat every 28 days.   No facility-administered encounter medications on file as of 05/28/2023.    Allergies (verified) Patient has no known allergies.   History: Past Medical History:  Diagnosis Date   Anemia    past hx many yrs ago    Arthritis    lt knee   Breast cancer ([name])    Breast cancer of 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) 04/02/2016   Chronic kidney disease    CKD   Colon polyps    COPD (chronic obstructive pulmonary disease) ([name])    GERD (gastroesophageal reflux disease)    years ago- not current    Hyperlipidemia    Hypertension    Multiple sclerosis ([name])    Personal history of chemotherapy    Personal history of radiation therapy    Stroke ([name])    2013, mild cognitive deficits   Vision abnormalities    Past Surgical History:  Procedure Laterality Date   BREAST BIOPSY Left 03/31/2016   BREAST BIOPSY Right 07/24/2021   BREAST LUMPECTOMY Left 04/17/2016   BREAST LUMPECTOMY Right 09/02/2021   BREAST LUMPECTOMY WITH RADIOACTIVE SEED AND SENTINEL LYMPH NODE BIOPSY Right 09/02/2021   Procedure: RIGHT BREAST LUMPECTOMY WITH RADIOACTIVE SEED AND SENTINEL LYMPH NODE BIOPSY;  Surgeon: Harriette Bouillon, MD;  Location: [facility];  Service: General;  Laterality: Right;   COLONOSCOPY     HYSTEROSCOPY WITH D & C N/A 12/19/2015   Procedure: DILATATION AND CURETTAGE /HYSTEROSCOPY;  Surgeon: Mitchel Honour, DO;  Location: [facility];  Service: Gynecology;  Laterality: N/A;   POLYPECTOMY     PORTACATH PLACEMENT Right 10/16/2021   Procedure: INSERTION PORT-A-CATH;  Surgeon: Harriette Bouillon, MD;  Location: [facility];  Service: General;  Laterality: Right;   RADIOACTIVE SEED GUIDED PARTIAL MASTECTOMY WITH AXILLARY SENTINEL LYMPH NODE BIOPSY Left 04/17/2016   Procedure: RADIOACTIVE SEED GUIDED PARTIAL MASTECTOMY WITH AXILLARY SENTINEL LYMPH NODE BIOPSY;  Surgeon: Harriette Bouillon, MD;  Location: [facility];  Service: General;  Laterality: Left;  RADIOACTIVE SEED GUIDED PARTIAL MASTECTOMY WITH AXILLARY SENTINEL LYMPH NODE BIOPSY    TUBAL LIGATION     VAGINAL DELIVERY     x3   Family History  Problem Relation Age of Onset   Hypertension Mother    Stroke Mother    Stroke Sister    Alcohol abuse Brother    HIV/AIDS Brother    Breast cancer Cousin        dx 81s   Colon cancer Neg Hx    Colon polyps Neg Hx    Esophageal cancer Neg Hx    Rectal cancer Neg Hx    Stomach cancer Neg Hx    Social History   Socioeconomic History   Marital status: Married    Spouse name: Not on file   Number of children: 3   Years of education: Not on file   Highest education level: Bachelor's degree (e.g., BA, AB, BS)  Occupational History   Occupation: retired  Tobacco Use   Smoking status: Former    Current packs/day: 0.00    Average packs/day: 0.5 packs/day for 25.0 years (12.5 ttl pk-yrs)    Types: Cigarettes    Start date: 11/17/1979    Quit date: 11/16/2004    Years since quitting: 18.5   Smokeless tobacco: Never  Vaping Use   Vaping status: Never Used  Substance and Sexual Activity   Alcohol use: Not Currently    Comment: occas   Drug use: No   Sexual activity: Yes    Comment: 1st intercourse 37 yo-5 partners  Other Topics Concern   Not on file  Social History Narrative   Diet- N/A   Caffeine- Yes   Married- Yes   House- 2 story with 2 people   Pets- No   Current/past profession- Research officer, 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   Exercise- Yes   Living will-No   DNR-N/A   POA/HPOA-No      Works part-time at [employer] Strain: Low Risk  (04/15/2023)   Overall Financial Resource Strain (CARDIA)    Difficulty of Paying Living Expenses: Not hard at all  Food Insecurity: No Food Insecurity (04/15/2023)   Hunger Vital Sign    Worried About Running Out of Food in the Last Year: Never true    Ran Out of Food in the Last Year: Never true   Transportation Needs: No Transportation Needs (04/15/2023)   PRAPARE - Administrator, Civil Service (Medical): No    Lack of Transportation (Non-Medical): No  Physical Activity: Sufficiently Active (04/15/2023)   Exercise Vital Sign    Days of Exercise per Week: 5 days    Minutes of Exercise per Session: 60 min  Stress: No Stress Concern Present (04/15/2023)  [employer] of Occupational Health - Occupational Stress Questionnaire    Feeling of Stress : Not at all  Social Connections: Unknown (04/15/2023)   Social Connection and Isolation Panel [NHANES]    Frequency of Communication with Friends and Family: More than three times a week    Frequency of Social Gatherings with Friends and Family: Not on file    Attends Religious Services: More than 4 times per year    Active Member of [employer] or Organizations: Yes    Attends Banker Meetings: 1 to 4 times per year    Marital Status: Not on file    Tobacco Counseling Counseling given: Not Answered   Clinical Intake:  Pre-visit preparation completed: Yes  Pain : No/denies pain Pain Score: 0-No pain     BMI - recorded: 35 Nutritional Status: BMI > 30  Obese Nutritional Risks: None Diabetes: No  How often do you need to have someone help you when you read instructions, pamphlets, or other written materials from your doctor or pharmacy?: 1 - Never         Activities of Daily Living    05/28/2023    2:57 PM 05/24/2023    9:14 AM  In your present state of health, do you have any difficulty performing the following activities:  Hearing? 0 0  Vision? 0 0  Difficulty concentrating or making decisions? 0 0  Walking or climbing stairs? 0 0  Dressing or bathing? 0 0  Doing errands, shopping? 0 0  Preparing Food and eating ? N N  Using the Toilet? N N  In the past six months, have you accidently leaked urine? N Y  Do you have problems with loss of bowel control? N N  Managing your Medications? N N   Managing your Finances? N N  Housekeeping or managing your Housekeeping? N N    Patient Care Team: Sharon Seller, NP as PCP - General (Geriatric Medicine) Runell Gess, MD as PCP - Cardiology (Cardiology) Serena Croissant, MD as Consulting Physician (Oncology) Harriette Bouillon, MD as Consulting Physician (General Surgery)  Indicate any recent Medical Services you may have received from other than [facility] providers in the past year (date may be approximate).     Assessment:   This is a routine wellness examination for Michele Page.  Hearing/Vision screen Vision Screening   Right eye Left eye Both eyes  Without correction     With correction 20/20 20/20 20/20   Comments: Last eye exam greater than 12 months ago.   Hearing Screening - Comments:: No hearing issues   Dietary issues and exercise activities discussed:     Goals Addressed             This Visit's Progress    Increase water intake   On track    Starting 12/11/16, I will attempt to increase my water intake from 2 glasses to 5 glasses per day.      Weight (lb) < 170 lb (77.1 kg)   202 lb (91.6 kg)    Through diet and exercise       Depression Screen    05/28/2023    2:40 PM 05/05/2023    1:47 PM 02/04/2023   10:50 AM 05/01/2022   12:43 PM 01/29/2022    2:14 PM 12/10/2020    3:48 PM 11/20/2020    2:07 PM  PHQ 2/9 Scores  PHQ - 2 Score 0 0 0 0 0 0 0    Fall Risk  05/28/2023    2:40 PM 05/24/2023    9:14 AM 05/05/2023    1:47 PM 04/19/2023   12:30 PM 02/04/2023   10:49 AM  Fall Risk   Falls in the past year? 0 1 0 0 0  Number falls in past yr: 0  0 0 0  Injury with Fall? 0  0 0 0  Risk for fall due to : No Fall Risks  No Fall Risks No Fall Risks   Follow up Falls evaluation completed  Falls evaluation completed Falls evaluation completed     MEDICARE RISK AT HOME:   TIMED UP AND GO:  Was the test performed?  No    Cognitive Function:    12/11/2016    1:33 PM  MMSE - Mini Mental State Exam   Orientation to time 5  Orientation to Place 4  Registration 3  Attention/ Calculation 5  Recall 2  Language- name 2 objects 2  Language- repeat 1  Language- follow 3 step command 2  Language- read & follow direction 1  Write a sentence 1  Copy design 0  Total score 26        02/04/2023   10:50 AM 01/29/2022    2:17 PM 12/10/2020    3:51 PM  6CIT Screen  What Year? 0 points 0 points 0 points  What month? 0 points 0 points 0 points  What time? 0 points 0 points 0 points  Count back from 20 0 points 0 points 0 points  Months in reverse 0 points 4 points 0 points  Repeat phrase 0 points 10 points 2 points  Total Score 0 points 14 points 2 points    Immunizations Immunization History  Administered Date(s) Administered   Influenza,inj,Quad PF,6+ Mos 08/07/2015, 08/06/2016, 11/24/2017, 10/26/2018, 07/12/2019   PPD Test 07/13/2017   Pneumococcal Conjugate-13 04/18/2021    TDAP status: Due, Education has been provided regarding the importance of this vaccine. Advised may receive this vaccine at local pharmacy or Health Dept. Aware to provide a copy of the vaccination record if obtained from local pharmacy or Health Dept. Verbalized acceptance and understanding.  Flu Vaccine status: Up to date  Pneumococcal vaccine status: Declined,  Education has been provided regarding the importance of this vaccine but patient still declined. Advised may receive this vaccine at local pharmacy or Health Dept. Aware to provide a copy of the vaccination record if obtained from local pharmacy or Health Dept. Verbalized acceptance and understanding.   Covid-19 vaccine status: Information provided on how to obtain vaccines.   Qualifies for Shingles Vaccine? Yes   Zostavax completed No   Shingrix Completed?: No.    Education has been provided regarding the importance of this vaccine. Patient has been advised to call insurance company to determine out of pocket expense if they have not yet received  this vaccine. Advised may also receive vaccine at local pharmacy or Health Dept. Verbalized acceptance and understanding.  Screening Tests Health Maintenance  Topic Date Due   COVID-19 Vaccine (1) 11/17/2023 (Originally 08/03/1961)   Zoster Vaccines- Shingrix (1 of 2) 11/17/2023 (Originally [birth year]1974)   Pneumonia Vaccine 7+ Years old (2 of 2 - PPSV23 or PCV20) 05/27/2024 (Originally 04/18/2022)   INFLUENZA VACCINE  06/17/2023   Colonoscopy  01/14/2024   Medicare Annual Wellness (AWV)  05/27/2024   MAMMOGRAM  09/22/2024   DEXA SCAN  04/21/2026   Hepatitis C Screening  Completed   HPV VACCINES  Aged Out   DTaP/Tdap/Td  Discontinued  Health Maintenance  There are no preventive care reminders to display for this patient.   Colorectal cancer screening: Type of screening: Colonoscopy. Completed 2022. Repeat every 3 years  Mammogram status: Completed 09/2022. Repeat every year  Bone Density status: Completed 6/22. Results reflect: Bone density results: NORMAL. Repeat every 5 years.  Lung Cancer Screening: (Low Dose CT Chest recommended if Age 101-80 years, 20 pack-year currently smoking OR have quit w/in 15years.) does not qualify.   Lung Cancer Screening Referral: na  Additional Screening:  Hepatitis C Screening: does qualify; Completed   Vision Screening: Recommended annual ophthalmology exams for early detection of glaucoma and other disorders of the eye. Is the patient up to date with their annual eye exam?  No  Who is the provider or what is the name of the office in which the patient attends annual eye exams? [facility] If pt is not established with a provider, would they like to be referred to a provider to establish care? No .   Dental Screening: Recommended annual dental exams for proper oral hygiene   Community Resource Referral / Chronic Care Management: CRR required this visit?  No   CCM required this visit?  No     Plan:     I have personally reviewed and  noted the following in the patient's chart:   Medical and social history Use of alcohol, tobacco or illicit drugs  Current medications and supplements including opioid prescriptions. Patient is not currently taking opioid prescriptions. Functional ability and status Nutritional status Physical activity Advanced directives List of other physicians Hospitalizations, surgeries, and ER visits in previous 12 months Vitals Screenings to include cognitive, depression, and falls Referrals and appointments  In addition, I have reviewed and discussed with patient certain preventive protocols, quality metrics, and best practice recommendations. A written personalized care plan for preventive services as well as general preventive health recommendations were provided to patient.     Sharon Seller, NP   05/28/2023

## 2023-05-31 ENCOUNTER — Inpatient Hospital Stay: Payer: Medicare HMO | Admitting: Pharmacist

## 2023-05-31 ENCOUNTER — Inpatient Hospital Stay: Payer: Medicare HMO | Attending: Hematology and Oncology

## 2023-05-31 VITALS — BP 152/81 | HR 81 | Temp 98.3°F | Resp 16 | Wt 200.7 lb

## 2023-05-31 DIAGNOSIS — Z79811 Long term (current) use of aromatase inhibitors: Secondary | ICD-10-CM | POA: Insufficient documentation

## 2023-05-31 DIAGNOSIS — C50412 Malignant neoplasm of upper-outer quadrant of left female breast: Secondary | ICD-10-CM

## 2023-05-31 DIAGNOSIS — Z79899 Other long term (current) drug therapy: Secondary | ICD-10-CM | POA: Insufficient documentation

## 2023-05-31 DIAGNOSIS — C50411 Malignant neoplasm of upper-outer quadrant of right female breast: Secondary | ICD-10-CM | POA: Diagnosis not present

## 2023-05-31 LAB — CMP (CANCER CENTER ONLY)
ALT: 11 U/L (ref 0–44)
AST: 17 U/L (ref 15–41)
Albumin: 4.3 g/dL (ref 3.5–5.0)
Alkaline Phosphatase: 89 U/L (ref 38–126)
Anion gap: 7 (ref 5–15)
BUN: 25 mg/dL — ABNORMAL HIGH (ref 8–23)
CO2: 26 mmol/L (ref 22–32)
Calcium: 9.5 mg/dL (ref 8.9–10.3)
Chloride: 109 mmol/L (ref 98–111)
Creatinine: 1.89 mg/dL — ABNORMAL HIGH (ref 0.44–1.00)
GFR, Estimated: 29 mL/min — ABNORMAL LOW (ref >60)
Glucose, Bld: 80 mg/dL (ref 70–99)
Potassium: 3.9 mmol/L (ref 3.5–5.1)
Sodium: 142 mmol/L (ref 135–145)
Total Bilirubin: 0.4 mg/dL (ref 0.3–1.2)
Total Protein: 6.9 g/dL (ref 6.5–8.1)

## 2023-05-31 LAB — CBC WITH DIFFERENTIAL (CANCER CENTER ONLY)
Abs Immature Granulocytes: 0.02 10*3/uL (ref 0.00–0.07)
Basophils Absolute: 0 10*3/uL (ref 0.0–0.1)
Basophils Relative: 2 %
Eosinophils Absolute: 0 10*3/uL (ref 0.0–0.5)
Eosinophils Relative: 0 %
HCT: 28.8 % — ABNORMAL LOW (ref 36.0–46.0)
Hemoglobin: 9.9 g/dL — ABNORMAL LOW (ref 12.0–15.0)
Immature Granulocytes: 1 %
Lymphocytes Relative: 33 %
Lymphs Abs: 0.8 10*3/uL (ref 0.7–4.0)
MCH: 34.5 pg — ABNORMAL HIGH (ref 26.0–34.0)
MCHC: 34.4 g/dL (ref 30.0–36.0)
MCV: 100.3 fL — ABNORMAL HIGH (ref 80.0–100.0)
Monocytes Absolute: 0.2 10*3/uL (ref 0.1–1.0)
Monocytes Relative: 7 %
Neutro Abs: 1.4 10*3/uL — ABNORMAL LOW (ref 1.7–7.7)
Neutrophils Relative %: 57 %
Platelet Count: 300 10*3/uL (ref 150–400)
RBC: 2.87 MIL/uL — ABNORMAL LOW (ref 3.87–5.11)
RDW: 13.3 % (ref 11.5–15.5)
WBC Count: 2.5 10*3/uL — ABNORMAL LOW (ref 4.0–10.5)
nRBC: 0 % (ref 0.0–0.2)

## 2023-05-31 NOTE — Progress Notes (Signed)
[facility]       Telephone: [phone]?Fax: [phone]   Oncology Clinical Pharmacist Practitioner Progress Note  Michele Page was contacted via in-person visit to discuss her chemotherapy regimen for palbociclib which they receive under the care of Dr. Serena Croissant.    Current treatment regimen and start date Palbociclib (09/28/22) 75 mg dose started ~11/30/22 100 mg dose started 09/28/22  Letrozole (06/05/22)   Interval History She continues on palbociclib 75 mg by mouth daily on days 1 to 21 of a 28-day cycle. This is being given  in combination with letrozole. Therapy is planned to continue until disease progression or unacceptable toxicity.  Michele Page was seen today by clinical pharmacy as a follow-up to her palbociclib management.  She was last seen by clinical pharmacy on 12/28/22 and Dr. Pamelia Hoit on 04/05/23.  Dr. Pamelia Hoit is planning restaging scans in August when he sees her next on 07/12/23.  Response to Therapy Michele Page is doing very well. She feels she is tolerating palbociclib and is not reporting any side effects at this time. Her ANC is estimated at 1400 cells/uL today and her hemoglobin has also decreased to 9.9 g/dL She is asymptomatic at this time. Her serum creatinine while elevated is improved from her last visit and she confirms that she has been trying really hard to drink more non-caffeinated fluids.  She states she is on her third week of her current cycle of palbociclib and will start her next cycle in 2 weeks. She is having no issues with obtaining drug [facility]. Dr. Pamelia Hoit has a follow up scan ordered for August and he will see her again with labs on 07/12/23 which has been scheduled. Clinical pharmacy will plan on seeing her again in November. Her blood pressure today was elevated but she is asymptomatic and her home ranges are usually WNL. She sees Abbey Chatters NP for her hypertension and other comorbidities.  We discussed today that if needed, we  can start her on a bone strengthener after her visit with Dr. Pamelia Hoit in August. She would likely need denosumab due to her renal function with her CrCl estimated today at 30.9 mL/min. Labs, vitals, treatment parameters, and manufacturer guidelines assessing toxicity were reviewed with Michele Page today. Based on these values, patient is in agreement to continue palbociclib therapy at this time.  Allergies No Known Allergies  Vitals    05/31/2023    2:08 PM 05/28/2023    2:46 PM 05/10/2023   11:38 AM  Oncology Vitals  Height  160 cm 160 cm  Weight 91.037 kg 91.627 kg 88.905 kg  Weight (lbs) 200 lbs 11 oz 202 lbs 196 lbs  BMI 35.55 kg/m2 35.78 kg/m2 34.72 kg/m2  Temp 98.3 F (36.8 C) 97.1 F (36.2 C) 97.1 F (36.2 C)  Pulse Rate 81 78 87  BP 152/81 136/82 114/70  Resp 16    SpO2 97 % 98 % 96 %  BSA (m2) 2.01 m2 2.02 m2 1.99 m2   Laboratory Data    Latest Ref Rng & Units 05/31/2023   12:51 PM 05/03/2023    9:52 AM 03/02/2023   12:36 PM  CBC EXTENDED  WBC 4.0 - 10.5 K/uL 2.5  3.1  3.4   RBC 3.87 - 5.11 MIL/uL 2.87  3.19  3.02   Hemoglobin 12.0 - 15.0 g/dL 9.9  29.5  28.4   HCT 13.2 - 46.0 % 28.8  32.7  30.4   Platelets 150 -  400 K/uL 300  261  353   NEUT# 1.7 - 7.7 K/uL 1.4  2.3  2.2   Lymph# 0.7 - 4.0 K/uL 0.8  0.5  0.8        Latest Ref Rng & Units 05/31/2023   12:51 PM 05/03/2023    9:52 AM 03/02/2023   12:36 PM  CMP  Glucose 70 - 99 mg/dL 80  94  161   BUN 8 - 23 mg/dL 25  22  22    Creatinine 0.44 - 1.00 mg/dL 0.96  0.45  4.09   Sodium 135 - 145 mmol/L 142  139  143   Potassium 3.5 - 5.1 mmol/L 3.9  4.3  3.8   Chloride 98 - 111 mmol/L 109  105  107   CO2 22 - 32 mmol/L 26  22  27    Calcium 8.9 - 10.3 mg/dL 9.5  9.3  9.5   Total Protein 6.5 - 8.1 g/dL 6.9  7.6  7.3   Total Bilirubin 0.3 - 1.2 mg/dL 0.4  1.1  0.4   Alkaline Phos 38 - 126 U/L 89  76  86   AST 15 - 41 U/L 17  27  18    ALT 0 - 44 U/L 11  18  11     Adverse Effects Assessment ANC: 1400 cells/uL today.  Down from 2300 cell/uL at last visit Hgb: 9.9 g/dL. Down from 11 g/dL at last visit. Asymptomatic Scr: improved from last visit. Will continue to monitor  Adherence Assessment Michele Page reports missing 1 doses over the past 8 weeks.   Reason for missed dose: forgot Patient was re-educated on importance of adherence.   Access Assessment Michele Page is currently receiving her palbociclib through [facility] concerns:  none  Medication Reconciliation The patient's medication list was reviewed today with the patient? Yes New medications or herbal supplements have recently been started? No  Any medications have been discontinued? No  The medication list was updated and reconciled based on the patient's most recent medication list in the electronic medical record (EMR) including herbal products and OTC medications.   Medications Current Outpatient Medications  Medication Sig Dispense Refill   allopurinol (ZYLOPRIM) 100 MG tablet Take 0.5 tablets (50 mg total) by mouth every other day. 30 tablet 1   aspirin EC 81 MG tablet Take 1 tablet (81 mg total) by mouth daily. 30 tablet 6   atorvastatin (LIPITOR) 20 MG tablet Take 1 tablet (20 mg total) by mouth daily. 90 tablet 1   labetalol (NORMODYNE) 300 MG tablet TAKE 2 TABLETS BY MOUTH THREE TIMES DAILY 180 tablet 0   letrozole (FEMARA) 2.5 MG tablet Take 1 tablet (2.5 mg total) by mouth daily. 90 tablet 3   NIFEdipine (PROCARDIA XL/NIFEDICAL XL) 60 MG 24 hr tablet TAKE 1 TABLET TWICE DAILY 180 tablet 1   palbociclib (IBRANCE) 75 MG tablet Take 1 tablet (75 mg total) by mouth daily. Take for 21 days on, 7 days off, repeat every 28 days. 21 tablet 3   No current facility-administered medications for this visit.   Drug-Drug Interactions (DDIs) DDIs were evaluated? Yes Significant DDIs? No  The patient was instructed to speak with their health care provider and/or the oral chemotherapy pharmacist before starting  any new drug, including prescription or over the counter, natural / herbal products, or vitamins.  Supportive Care Fever: reviewed the importance of having a thermometer and the Centers for Disease Control and Prevention (CDC)  definition of fever which is 100.47F (38C) or higher. Patient should call 24/7 triage at [phone] if experiencing a fever or any other symptoms Myelosuppression ILD / Pneumonitis Fatigue Alopecia N/V/D  Dosing Assessment Hepatic adjustments needed? No  Renal adjustments needed? No  Toxicity adjustments needed? No  The current dosing regimen is appropriate to continue at this time.  Follow-Up Plan Continue palbociclib 75 mg by mouth daily 21 out of 28-day cycle. She states she will start a new cycle in 2 weeks (06/14/23). Continue letrozole 2.5 mg by mouth daily Continue to monitor Scr, Hgb, ANC She will continue to follow with Abbey Chatters, NP for her other comorbidities such as hypertension.  Labs, Dr. Pamelia Hoit visit on 07/11/22. Restaging scans to be reviewed at this time as well. Gave her radiology number to call. May start bone strengthener at a later time. Will add labs, pharmacy clinic visit, for 10/04/23.   Michele Page participated in the discussion, expressed understanding, and voiced agreement with the above plan. All questions were answered to her satisfaction. The patient was advised to contact the clinic at [phone] with any questions or concerns prior to her return visit.   I spent 30 minutes assessing and educating the patient.  Katelyn Kohlmeyer A. Odetta Pink, PharmD, BCOP, CPP  Anselm Lis, RPH-CPP, 05/31/2023  1:56 PM  **Disclaimer: This note was dictated with voice recognition software. Similar sounding words can inadvertently be transcribed and this note may contain transcription errors which may not have been corrected upon publication of note.**

## 2023-06-01 ENCOUNTER — Other Ambulatory Visit (HOSPITAL_COMMUNITY): Payer: Self-pay

## 2023-06-01 ENCOUNTER — Telehealth: Payer: Self-pay | Admitting: Pharmacist

## 2023-06-01 NOTE — Telephone Encounter (Signed)
Scheduled appointment per 7/15 los. Patient is aware of the made appointments.

## 2023-06-08 ENCOUNTER — Other Ambulatory Visit (HOSPITAL_COMMUNITY): Payer: Self-pay

## 2023-06-14 DIAGNOSIS — C50411 Malignant neoplasm of upper-outer quadrant of right female breast: Secondary | ICD-10-CM | POA: Diagnosis not present

## 2023-06-14 DIAGNOSIS — Z170 Estrogen receptor positive status [ER+]: Secondary | ICD-10-CM | POA: Diagnosis not present

## 2023-06-17 ENCOUNTER — Other Ambulatory Visit: Payer: Self-pay

## 2023-06-17 DIAGNOSIS — I10 Essential (primary) hypertension: Secondary | ICD-10-CM

## 2023-06-17 MED ORDER — LABETALOL HCL 300 MG PO TABS
600.0000 mg | ORAL_TABLET | Freq: Three times a day (TID) | ORAL | 1 refills | Status: DC
Start: 2023-06-17 — End: 2023-12-09

## 2023-06-17 NOTE — Telephone Encounter (Signed)
Patient called requesting refill on medication. High warning came up when trying to refill medication. Patient stated that pharmacy changed medication to a blue tablet and that she can only take the white ones.  Medication pended and sent to Abbey Chatters, NP

## 2023-06-23 ENCOUNTER — Telehealth: Payer: Self-pay

## 2023-06-23 NOTE — Telephone Encounter (Signed)
Called pt to make her aware of her signatera positive results. Per MD, it is up to the pt whether or not she will continue Signatera testing given her known metastatic disease. She will discuss this with MD at 8/26 appt.  She is scheduled for CT CAP 07/06/23 at 245 at [facility]. Message sent to PA team today to obtain PA.

## 2023-06-24 ENCOUNTER — Encounter: Payer: Self-pay | Admitting: Hematology and Oncology

## 2023-06-24 DIAGNOSIS — M1812 Unilateral primary osteoarthritis of first carpometacarpal joint, left hand: Secondary | ICD-10-CM | POA: Diagnosis not present

## 2023-06-25 DIAGNOSIS — J3489 Other specified disorders of nose and nasal sinuses: Secondary | ICD-10-CM | POA: Diagnosis not present

## 2023-06-28 ENCOUNTER — Telehealth: Payer: Self-pay | Admitting: *Deleted

## 2023-06-28 NOTE — Telephone Encounter (Signed)
Patient called and stated that the pharmacist told her that she can no longer get Labetalol due to not being made anymore.   Patient stated that this is the only thing that works for her and if she has anything lower in strength it will not work to keep her blood pressures down.   Requesting Equivalent alternative.   Please Advise.

## 2023-06-29 NOTE — Telephone Encounter (Signed)
I spoke with patient again and she states that she will speak with the pharmacy. She states that she feels that she is not being listened to. She states she is "the one who takes the medication and knows what works and what doesn't work"/ She feels that she is just being pushed off to the pharmacy. She states that she will discuss with pharmacy,but if she doesn't get what she wants she will have to change her provider.

## 2023-06-29 NOTE — Telephone Encounter (Signed)
Have her discuss this with the pharmacy. She does not need to change medication at this time.

## 2023-06-29 NOTE — Telephone Encounter (Signed)
I called and spoke with patient. She wanted to know which pill she was going to get. She states that the white pill is the only pill that works for her. She stated that she has a lot of blue pills and green pills that are a waste.She stated that these made her blood pressure go up. She would like to know why she just can't get a new medication.

## 2023-06-29 NOTE — Telephone Encounter (Signed)
I called and spoke with Michele Page at [facility]. He states that they have plenty of labetalol, the patient just wants a specific manufacturer. He states the manufacturer the patient prefers doesn't make it anymore. He states patient says that patient said other manufacturers don't work for her.

## 2023-06-29 NOTE — Telephone Encounter (Signed)
Can you please call the pharmacy and clarify this. Thank you

## 2023-06-29 NOTE — Telephone Encounter (Signed)
Okay please let the patient know we will continue medication but from different manufacture and she can make appt in office for blood pressure check once she starts

## 2023-07-01 ENCOUNTER — Other Ambulatory Visit (HOSPITAL_COMMUNITY): Payer: Self-pay

## 2023-07-01 ENCOUNTER — Other Ambulatory Visit: Payer: Self-pay | Admitting: Hematology and Oncology

## 2023-07-01 ENCOUNTER — Other Ambulatory Visit: Payer: Self-pay

## 2023-07-01 MED ORDER — PALBOCICLIB 75 MG PO TABS
75.0000 mg | ORAL_TABLET | Freq: Every day | ORAL | 3 refills | Status: DC
  Filled 2023-07-01: qty 21, 28d supply, fill #0
  Filled 2023-07-28: qty 21, 28d supply, fill #1
  Filled 2023-08-26: qty 21, 28d supply, fill #2
  Filled 2023-09-21: qty 21, 28d supply, fill #3

## 2023-07-02 ENCOUNTER — Other Ambulatory Visit: Payer: Self-pay

## 2023-07-02 DIAGNOSIS — C50412 Malignant neoplasm of upper-outer quadrant of left female breast: Secondary | ICD-10-CM

## 2023-07-02 DIAGNOSIS — Z170 Estrogen receptor positive status [ER+]: Secondary | ICD-10-CM

## 2023-07-05 ENCOUNTER — Other Ambulatory Visit (HOSPITAL_COMMUNITY): Payer: Self-pay

## 2023-07-06 ENCOUNTER — Ambulatory Visit (HOSPITAL_COMMUNITY)
Admission: RE | Admit: 2023-07-06 | Discharge: 2023-07-06 | Disposition: A | Discharge status: Home / Self Care | Payer: Medicare HMO | Source: Ambulatory Visit | Attending: Hematology and Oncology | Admitting: Hematology and Oncology

## 2023-07-06 DIAGNOSIS — R590 Localized enlarged lymph nodes: Secondary | ICD-10-CM | POA: Diagnosis not present

## 2023-07-06 DIAGNOSIS — C50411 Malignant neoplasm of upper-outer quadrant of right female breast: Secondary | ICD-10-CM | POA: Diagnosis not present

## 2023-07-06 DIAGNOSIS — C50919 Malignant neoplasm of unspecified site of unspecified female breast: Secondary | ICD-10-CM | POA: Diagnosis not present

## 2023-07-06 DIAGNOSIS — K5730 Diverticulosis of large intestine without perforation or abscess without bleeding: Secondary | ICD-10-CM | POA: Diagnosis not present

## 2023-07-06 DIAGNOSIS — Z170 Estrogen receptor positive status [ER+]: Secondary | ICD-10-CM | POA: Insufficient documentation

## 2023-07-06 DIAGNOSIS — C50412 Malignant neoplasm of upper-outer quadrant of left female breast: Secondary | ICD-10-CM | POA: Insufficient documentation

## 2023-07-06 DIAGNOSIS — I700 Atherosclerosis of aorta: Secondary | ICD-10-CM | POA: Diagnosis not present

## 2023-07-09 ENCOUNTER — Encounter: Payer: Self-pay | Admitting: Hematology and Oncology

## 2023-07-09 ENCOUNTER — Other Ambulatory Visit: Payer: Self-pay

## 2023-07-09 DIAGNOSIS — Z170 Estrogen receptor positive status [ER+]: Secondary | ICD-10-CM

## 2023-07-09 NOTE — Progress Notes (Signed)
Patient Care Team: Sharon Seller, NP as PCP - General (Geriatric Medicine) Runell Gess, MD as PCP - Cardiology (Cardiology) Serena Croissant, MD as Consulting Physician (Oncology) Harriette Bouillon, MD as Consulting Physician (General Surgery)  DIAGNOSIS: No diagnosis found.  SUMMARY OF ONCOLOGIC HISTORY: Oncology History  Breast cancer of 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)  03/31/2016 Initial Diagnosis   Screening detected left breast mass, 2 nodules, 1.9 x 1.6 x 0.8 cm= fat necrosis; 8 x 7 x 7 mm= grade 2 IDC ER 100%, PR 100%, HER-2 negative ratio 1.39, Ki-67 20%   04/17/2016 Surgery   Left lumpectomy ([name]): IDC grade 2, 1.3 cm, with DCIS, margins negative, 0/4 lymph nodes negative, T1 cN0 stage IA pathologic stage, ER 100%, PR 100%, HER-2 negative ratio 1.39, Ki-67 20% Oncotype DX score 20, 13% ROR, intermediate risk   04/17/2016 Oncotype testing   Recurrence score: 20; ROR 15% (intermediate risk)    05/27/2016 - 07/13/2016 Radiation Therapy   Adjuvant radiation therapy [facility]): Left breast treated with breath hold to 50.4 Gy in 28 fractions at 1.8 Gy/fraction.  Left breast boosted to 10 Gy in 5 fractions at 2 Gy/fraction   07/23/2016 -  Anti-estrogen oral therapy   Anastrozole 1 mg switched to letrozole 04/22/2017 due to hair loss, switch to tamoxifen 10/22/2017 due to hair loss from letrozole as well   Malignant neoplasm of upper-outer quadrant of right breast in female, estrogen receptor positive ([name])  08/04/2021 Initial Diagnosis   Malignant neoplasm of upper-outer quadrant of right breast in female, estrogen receptor positive ([name])    Relapse/Recurrence   2.2 cm ILC ER 100%, PR 90%, Her 2 Neg, Ki 67: 25%, Size 2.5 cm   08/04/2021 Cancer Staging   Staging form: Breast, AJCC 8th Edition - Clinical: Stage IB (cT2, cN0, cM0, G2, ER+, PR+, HER2-) - Signed by Serena Croissant, MD on 08/04/2021 Stage prefix: Initial diagnosis Histologic grading system: 3 grade  system   09/02/2021 Surgery   Right lumpectomy: Grade 2 ILC 3.1 cm, margins negative, LVI present, 4/6 lymph nodes macrometastases, 2 lymph nodes isolated tumor cells, ER 100%, PR 90%, Ki-67 25%, HER2 negative   10/17/2021 - 01/30/2022 Chemotherapy   Patient is on Treatment Plan : BREAST Adjuvant CMF IV q21d      Genetic Testing   Negative genetic testing. No pathogenic variants identified on the [employer] CustomNext+RNA panel. The report date is 03/04/2022.  The CustomNext-Cancer+RNAinsight panel offered by [employer] includes sequencing and rearrangement analysis for the following 47 genes:  APC, ATM, AXIN2, BARD1, BMPR1A, BRCA1, BRCA2, BRIP1, CDH1, CDK4, CDKN2A, CHEK2, DICER1, EPCAM, GREM1, HOXB13, MEN1, MLH1, MSH2, MSH3, MSH6, MUTYH, NBN, NF1, NF2, NTHL1, PALB2, PMS2, POLD1, POLE, PTEN, RAD51C, RAD51D, RECQL, RET, SDHA, SDHAF2, SDHB, SDHC, SDHD, SMAD4, SMARCA4, STK11, TP53, TSC1, TSC2, and VHL.  RNA data is routinely analyzed for use in variant interpretation for all genes.     CHIEF COMPLIANT:   INTERVAL HISTORY: Michele Page is a   ALLERGIES:  has No Known Allergies.  MEDICATIONS:  Current Outpatient Medications  Medication Sig Dispense Refill   allopurinol (ZYLOPRIM) 100 MG tablet Take 0.5 tablets (50 mg total) by mouth every other day. 30 tablet 1   aspirin EC 81 MG tablet Take 1 tablet (81 mg total) by mouth daily. 30 tablet 6   atorvastatin (LIPITOR) 20 MG tablet Take 1 tablet (20 mg total) by mouth daily. 90 tablet 1   labetalol (NORMODYNE) 300 MG tablet Take 2  tablets (600 mg total) by mouth 3 (three) times daily. 270 tablet 1   letrozole (FEMARA) 2.5 MG tablet Take 1 tablet (2.5 mg total) by mouth daily. 90 tablet 3   NIFEdipine (PROCARDIA XL/NIFEDICAL XL) 60 MG 24 hr tablet TAKE 1 TABLET TWICE DAILY 180 tablet 1   palbociclib (IBRANCE) 75 MG tablet Take 1 tablet (75 mg total) by mouth daily. Take for 21 days on, 7 days off, repeat every 28 days. 21 tablet 3   No current  facility-administered medications for this visit.    PHYSICAL EXAMINATION: ECOG PERFORMANCE STATUS: {CHL ONC ECOG [phone]}  There were no vitals filed for this visit. There were no vitals filed for this visit.  BREAST:*** No palpable masses or nodules in either right or left breasts. No palpable axillary supraclavicular or infraclavicular adenopathy no breast tenderness or nipple discharge. (exam performed in the presence of a chaperone)  LABORATORY DATA:  I have reviewed the data as listed    Latest Ref Rng & Units 05/31/2023   12:51 PM 05/03/2023    9:52 AM 03/02/2023   12:36 PM  CMP  Glucose 70 - 99 mg/dL 80  94  409   BUN 8 - 23 mg/dL 25  22  22    Creatinine 0.44 - 1.00 mg/dL 8.11  9.14  7.82   Sodium 135 - 145 mmol/L 142  139  143   Potassium 3.5 - 5.1 mmol/L 3.9  4.3  3.8   Chloride 98 - 111 mmol/L 109  105  107   CO2 22 - 32 mmol/L 26  22  27    Calcium 8.9 - 10.3 mg/dL 9.5  9.3  9.5   Total Protein 6.5 - 8.1 g/dL 6.9  7.6  7.3   Total Bilirubin 0.3 - 1.2 mg/dL 0.4  1.1  0.4   Alkaline Phos 38 - 126 U/L 89  76  86   AST 15 - 41 U/L 17  27  18    ALT 0 - 44 U/L 11  18  11      Lab Results  Component Value Date   WBC 2.5 (L) 05/31/2023   HGB 9.9 (L) 05/31/2023   HCT 28.8 (L) 05/31/2023   MCV 100.3 (H) 05/31/2023   PLT 300 05/31/2023   NEUTROABS 1.4 (L) 05/31/2023    ASSESSMENT & PLAN:  No problem-specific Assessment & Plan notes found for this encounter.    No orders of the defined types were placed in this encounter.  The patient has a good understanding of the overall plan. she agrees with it. she will call with any problems that may develop before the next visit here. Total time spent: 30 mins including face to face time and time spent for planning, charting and co-ordination of care   Sherlyn Lick, CMA 07/09/23    I Janan Ridge am acting as a Neurosurgeon for [employer]  ***

## 2023-07-12 ENCOUNTER — Inpatient Hospital Stay: Payer: Medicare HMO | Admitting: Hematology and Oncology

## 2023-07-12 ENCOUNTER — Inpatient Hospital Stay: Payer: Medicare HMO | Attending: Hematology and Oncology

## 2023-07-12 VITALS — BP 152/71 | HR 79 | Temp 97.7°F | Resp 18 | Ht 63.0 in | Wt 195.6 lb

## 2023-07-12 DIAGNOSIS — Z79811 Long term (current) use of aromatase inhibitors: Secondary | ICD-10-CM | POA: Insufficient documentation

## 2023-07-12 DIAGNOSIS — C50412 Malignant neoplasm of upper-outer quadrant of left female breast: Secondary | ICD-10-CM | POA: Diagnosis not present

## 2023-07-12 DIAGNOSIS — C7951 Secondary malignant neoplasm of bone: Secondary | ICD-10-CM | POA: Insufficient documentation

## 2023-07-12 DIAGNOSIS — Z170 Estrogen receptor positive status [ER+]: Secondary | ICD-10-CM | POA: Insufficient documentation

## 2023-07-12 LAB — CBC WITH DIFFERENTIAL (CANCER CENTER ONLY)
Abs Immature Granulocytes: 0 10*3/uL (ref 0.00–0.07)
Basophils Absolute: 0.1 10*3/uL (ref 0.0–0.1)
Basophils Relative: 2 %
Eosinophils Absolute: 0 10*3/uL (ref 0.0–0.5)
Eosinophils Relative: 1 %
HCT: 30.6 % — ABNORMAL LOW (ref 36.0–46.0)
Hemoglobin: 10.7 g/dL — ABNORMAL LOW (ref 12.0–15.0)
Immature Granulocytes: 0 %
Lymphocytes Relative: 24 %
Lymphs Abs: 0.8 10*3/uL (ref 0.7–4.0)
MCH: 35.1 pg — ABNORMAL HIGH (ref 26.0–34.0)
MCHC: 35 g/dL (ref 30.0–36.0)
MCV: 100.3 fL — ABNORMAL HIGH (ref 80.0–100.0)
Monocytes Absolute: 0.2 10*3/uL (ref 0.1–1.0)
Monocytes Relative: 8 %
Neutro Abs: 2.1 10*3/uL (ref 1.7–7.7)
Neutrophils Relative %: 65 %
Platelet Count: 197 10*3/uL (ref 150–400)
RBC: 3.05 MIL/uL — ABNORMAL LOW (ref 3.87–5.11)
RDW: 13.5 % (ref 11.5–15.5)
WBC Count: 3.1 10*3/uL — ABNORMAL LOW (ref 4.0–10.5)
nRBC: 0 % (ref 0.0–0.2)

## 2023-07-12 LAB — CMP (CANCER CENTER ONLY)
ALT: 10 U/L (ref 0–44)
AST: 17 U/L (ref 15–41)
Albumin: 4.4 g/dL (ref 3.5–5.0)
Alkaline Phosphatase: 101 U/L (ref 38–126)
Anion gap: 11 (ref 5–15)
BUN: 22 mg/dL (ref 8–23)
CO2: 24 mmol/L (ref 22–32)
Calcium: 9.5 mg/dL (ref 8.9–10.3)
Chloride: 107 mmol/L (ref 98–111)
Creatinine: 1.94 mg/dL — ABNORMAL HIGH (ref 0.44–1.00)
GFR, Estimated: 28 mL/min — ABNORMAL LOW (ref >60)
Glucose, Bld: 161 mg/dL — ABNORMAL HIGH (ref 70–99)
Potassium: 3.3 mmol/L — ABNORMAL LOW (ref 3.5–5.1)
Sodium: 142 mmol/L (ref 135–145)
Total Bilirubin: 0.4 mg/dL (ref 0.3–1.2)
Total Protein: 7 g/dL (ref 6.5–8.1)

## 2023-07-12 NOTE — Assessment & Plan Note (Addendum)
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 systemic chemotherapy with CMF x6 cycles completed 01/30/2022 2. adjuvant radiation therapy completing 04/09/2022 3.  For the continued antiestrogen therapy (previously anastrozole started 07/23/2016 switched to letrozole 2018 switched to tamoxifen 2018, switched back to anastrozole) hair loss was the main problem, may consider exemestane ------------------------------------------------------------------------------------------------------------------------------- Current treatment: Letrozole with Ibrance.  Patient could not tolerate Verzinio Signatera:  06/12/2022: Positive (0.52) 09/02/2022: Positive (68.11) Mar 22, 2023: Positive (106.54) 07/06/2023: Positive (662)   CT CAP 07/10/2022: No metastatic disease.  Tiny pulmonary nodules nonspecific. CT CAP 04/02/2023: New peripherally sclerotic lesion superior endplate L2 suspicious for treated metastasis or Schmorl's node.  No other sites of metastatic disease identified CT CAP 07/06/2023: Pending   The rise in the Signatera testing is concerning for progression of disease.

## 2023-07-13 DIAGNOSIS — I129 Hypertensive chronic kidney disease with stage 1 through stage 4 chronic kidney disease, or unspecified chronic kidney disease: Secondary | ICD-10-CM | POA: Diagnosis not present

## 2023-07-13 DIAGNOSIS — M109 Gout, unspecified: Secondary | ICD-10-CM | POA: Diagnosis not present

## 2023-07-13 DIAGNOSIS — E785 Hyperlipidemia, unspecified: Secondary | ICD-10-CM | POA: Diagnosis not present

## 2023-07-13 DIAGNOSIS — C50919 Malignant neoplasm of unspecified site of unspecified female breast: Secondary | ICD-10-CM | POA: Diagnosis not present

## 2023-07-13 DIAGNOSIS — N184 Chronic kidney disease, stage 4 (severe): Secondary | ICD-10-CM | POA: Diagnosis not present

## 2023-07-15 ENCOUNTER — Other Ambulatory Visit: Payer: Self-pay | Admitting: Nurse Practitioner

## 2023-07-15 DIAGNOSIS — N3281 Overactive bladder: Secondary | ICD-10-CM

## 2023-07-15 NOTE — Telephone Encounter (Signed)
Patient requested a medication refill on Gemtesa. The medication was discontinued by you on 05/10/2023. Is this an appropriate refill?

## 2023-07-16 DIAGNOSIS — N184 Chronic kidney disease, stage 4 (severe): Secondary | ICD-10-CM | POA: Diagnosis not present

## 2023-07-20 ENCOUNTER — Encounter: Payer: Self-pay | Admitting: Sports Medicine

## 2023-07-20 ENCOUNTER — Ambulatory Visit (INDEPENDENT_AMBULATORY_CARE_PROVIDER_SITE_OTHER): Payer: Medicare HMO | Admitting: Sports Medicine

## 2023-07-20 VITALS — BP 132/78 | HR 81 | Temp 97.1°F | Resp 18 | Ht 62.0 in | Wt 199.2 lb

## 2023-07-20 DIAGNOSIS — K644 Residual hemorrhoidal skin tags: Secondary | ICD-10-CM | POA: Diagnosis not present

## 2023-07-20 DIAGNOSIS — R1013 Epigastric pain: Secondary | ICD-10-CM

## 2023-07-20 DIAGNOSIS — N3281 Overactive bladder: Secondary | ICD-10-CM | POA: Diagnosis not present

## 2023-07-20 DIAGNOSIS — M545 Low back pain, unspecified: Secondary | ICD-10-CM

## 2023-07-20 MED ORDER — GEMTESA 75 MG PO TABS: 1.0000 | ORAL_TABLET | Freq: Every day | ORAL | 0 refills | Status: DC

## 2023-07-20 NOTE — Progress Notes (Signed)
Careteam: Patient Care Team: Sharon Seller, NP as PCP - General (Geriatric Medicine) Runell Gess, MD as PCP - Cardiology (Cardiology) Serena Croissant, MD as Consulting Physician (Oncology) Harriette Bouillon, MD as Consulting Physician (General Surgery)  PLACE OF SERVICE:  [facility]  Advanced Directive information    No Known Allergies  Chief Complaint  Patient presents with   Acute Visit    Patient is being seen with rectal exam    Immunizations    Patient is due for flu vaccine      HPI: Patient is a 67 y.o. female is here for acute visit   Pt  c/o pain in upper belly for 2 days with pain radiating to back and left lower leg Feels nauseous but denies vomiting  Describes as dull pain  Constipation - last BM  yesterday  Strains occasionally  Denies blood in her stool Denies fevers, chills Last colonoscopy 06/2021 3-5 polyps in ascending , transverse colon which were removed, diverticulosis , int hemorrhoids. Drinks about 16oz of water  States she feels like some part of rectum is hanging out   CT ABDOMEN AND PELVIS FINDINGS  2024   Hepatobiliary: The liver appears stable, without suspicious focal abnormality on noncontrast imaging. No evidence of gallstones, gallbladder wall thickening or biliary dilatation.   Pancreas: Unremarkable. No pancreatic ductal dilatation or surrounding inflammatory changes.   Spleen: Normal in size without focal abnormality.   Adrenals/Urinary Tract: The adrenal glands are stable, without suspicious findings. No evidence of urinary tract calculus, suspicious renal lesion or hydronephrosis. The bladder appears normal for its degree of distention.   Stomach/Bowel: No enteric contrast administered. The stomach appears unremarkable for its degree of distension. No evidence of bowel wall thickening, distention or surrounding inflammatory change. The appendix remains normal in appearance. Diffuse colonic diverticulosis again  noted.   Review of Systems:  Review of Systems  Constitutional:  Negative for chills and fever.  HENT:  Negative for congestion and sore throat.   Eyes:  Negative for double vision.  Respiratory:  Negative for cough, sputum production and shortness of breath.   Cardiovascular:  Negative for chest pain, palpitations and leg swelling.  Gastrointestinal:  Positive for abdominal pain, constipation and nausea. Negative for heartburn.  Genitourinary:  Negative for dysuria, frequency and hematuria.  Musculoskeletal:  Positive for back pain. Negative for falls and myalgias.  Neurological:  Negative for dizziness, sensory change and focal weakness.    Past Medical History:  Diagnosis Date   Anemia    past hx many yrs ago    Arthritis    lt knee   Breast cancer ([name])    Breast cancer of 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) 04/02/2016   Chronic kidney disease    CKD   Colon polyps    COPD (chronic obstructive pulmonary disease) ([name])    GERD (gastroesophageal reflux disease)    years ago- not current    Hyperlipidemia    Hypertension    Multiple sclerosis ([name])    Personal history of chemotherapy    Personal history of radiation therapy    Stroke ([name])    2013, mild cognitive deficits   Vision abnormalities    Past Surgical History:  Procedure Laterality Date   BREAST BIOPSY Left 03/31/2016   BREAST BIOPSY Right 07/24/2021   BREAST LUMPECTOMY Left 04/17/2016   BREAST LUMPECTOMY Right 09/02/2021   BREAST LUMPECTOMY WITH RADIOACTIVE SEED AND SENTINEL LYMPH NODE BIOPSY Right 09/02/2021   Procedure:  RIGHT BREAST LUMPECTOMY WITH RADIOACTIVE SEED AND SENTINEL LYMPH NODE BIOPSY;  Surgeon: Harriette Bouillon, MD;  Location: [facility];  Service: General;  Laterality: Right;   COLONOSCOPY     HYSTEROSCOPY WITH D & C N/A 12/19/2015   Procedure: DILATATION AND CURETTAGE /HYSTEROSCOPY;  Surgeon: Mitchel Honour, DO;  Location: [facility];  Service: Gynecology;  Laterality:  N/A;   POLYPECTOMY     PORTACATH PLACEMENT Right 10/16/2021   Procedure: INSERTION PORT-A-CATH;  Surgeon: Harriette Bouillon, MD;  Location: [facility];  Service: General;  Laterality: Right;   RADIOACTIVE SEED GUIDED PARTIAL MASTECTOMY WITH AXILLARY SENTINEL LYMPH NODE BIOPSY Left 04/17/2016   Procedure: RADIOACTIVE SEED GUIDED PARTIAL MASTECTOMY WITH AXILLARY SENTINEL LYMPH NODE BIOPSY;  Surgeon: Harriette Bouillon, MD;  Location: [facility];  Service: General;  Laterality: Left;  RADIOACTIVE SEED GUIDED PARTIAL MASTECTOMY WITH AXILLARY SENTINEL LYMPH NODE BIOPSY    TUBAL LIGATION     VAGINAL DELIVERY     x3   Social History:   reports that she quit smoking about 18 years ago. Her smoking use included cigarettes. She started smoking about 43 years ago. She has a 12.5 pack-year smoking history. She has never used smokeless tobacco. She reports that she does not currently use alcohol. She reports that she does not use drugs.  Family History  Problem Relation Age of Onset   Hypertension Mother    Stroke Mother    Stroke Sister    Alcohol abuse Brother    HIV/AIDS Brother    Breast cancer Cousin        dx 71s   Colon cancer Neg Hx    Colon polyps Neg Hx    Esophageal cancer Neg Hx    Rectal cancer Neg Hx    Stomach cancer Neg Hx     Medications: Patient's Medications  New Prescriptions   No medications on file  Previous Medications   ALLOPURINOL (ZYLOPRIM) 100 MG TABLET    Take 0.5 tablets (50 mg total) by mouth every other day.   ASPIRIN EC 81 MG TABLET    Take 1 tablet (81 mg total) by mouth daily.   ATORVASTATIN (LIPITOR) 20 MG TABLET    Take 1 tablet (20 mg total) by mouth daily.   GEMTESA 75 MG TABS    Take 1 tablet by mouth once daily   LABETALOL (NORMODYNE) 300 MG TABLET    Take 2 tablets (600 mg total) by mouth 3 (three) times daily.   LETROZOLE (FEMARA) 2.5 MG TABLET    Take 1 tablet (2.5 mg total) by mouth daily.   NIFEDIPINE (PROCARDIA  XL/NIFEDICAL XL) 60 MG 24 HR TABLET    TAKE 1 TABLET TWICE DAILY   PALBOCICLIB (IBRANCE) 75 MG TABLET    Take 1 tablet (75 mg total) by mouth daily. Take for 21 days on, 7 days off, repeat every 28 days.  Modified Medications   No medications on file  Discontinued Medications   No medications on file    Physical Exam:  Vitals:   07/20/23 1305  BP: 132/78  Pulse: 81  Resp: 18  Temp: (!) 97.1 F (36.2 C)  SpO2: 98%  Weight: 199 lb 3.2 oz (90.4 kg)  Height: 5\' 2"  (1.575 m)   Body mass index is 36.43 kg/m. Wt Readings from Last 3 Encounters:  07/20/23 199 lb 3.2 oz (90.4 kg)  07/12/23 195 lb 9.6 oz (88.7 kg)  05/31/23 200 lb 11.2 oz (91 kg)  Physical Exam Constitutional:      General: She is not in acute distress.    Appearance: Normal appearance. She is not ill-appearing.  Cardiovascular:     Rate and Rhythm: Normal rate and regular rhythm.     Pulses: Normal pulses.     Heart sounds: Normal heart sounds.  Pulmonary:     Effort: No respiratory distress.     Breath sounds: Normal breath sounds. No wheezing or rales.  Abdominal:     General: There is no distension.     Tenderness: There is no abdominal tenderness. There is no guarding or rebound.  Musculoskeletal:        General: No swelling.  Neurological:     Mental Status: She is alert. Mental status is at baseline.     Motor: No weakness.     Labs reviewed: Basic Metabolic Panel: Recent Labs    05/03/23 0952 05/31/23 1251 07/12/23 1158  NA 139 142 142  K 4.3 3.9 3.3*  CL 105 109 107  CO2 22 26 24   GLUCOSE 94 80 161*  BUN 22 25* 22  CREATININE 2.38* 1.89* 1.94*  CALCIUM 9.3 9.5 9.5   Liver Function Tests: Recent Labs    05/03/23 0952 05/31/23 1251 07/12/23 1158  AST 27 17 17   ALT 18 11 10   ALKPHOS 76 89 101  BILITOT 1.1 0.4 0.4  PROT 7.6 6.9 7.0  ALBUMIN 4.0 4.3 4.4   No results for input(s): "LIPASE", "AMYLASE" in the last 8760 hours. No results for input(s): "AMMONIA" in the last 8760  hours. CBC: Recent Labs    05/03/23 0952 05/31/23 1251 07/12/23 1158  WBC 3.1* 2.5* 3.1*  NEUTROABS 2.3 1.4* 2.1  HGB 11.0* 9.9* 10.7*  HCT 32.7* 28.8* 30.6*  MCV 102.5* 100.3* 100.3*  PLT 261 300 197   Lipid Panel: Recent Labs    04/19/23 1332  CHOL 150  HDL 75  LDLCALC 57  TRIG 92  CHOLHDL 2.0   TSH: No results for input(s): "TSH" in the last 8760 hours. A1C: Lab Results  Component Value Date   HGBA1C 5.8 (H) 05/13/2020     Assessment/Plan 1. Epigastric pain Pt c/o epigastric pain with pain radiating to back  Feels nauseous Will check cbc, bmp Per rectal exam - no active bleeding , fobt negative - CBC (no diff) - Basic Metabolic Panel with eGFR - Lipase  2. Residual hemorrhoidal skin tags Instructed patient to  increase water intake  Take stool softeners daily  Increase fiber intake   Low back pain  Mild para spinal tenderness Denies dysuria Ct from 06/2023  No evidence of progressive osseous metastatic disease. Mild thoracic spondylosis. Instructed to take tylenol prn for pain  Instructed  to follow up if no improvement     No follow-ups on file.:

## 2023-07-21 LAB — CBC
HCT: 29.5 % — ABNORMAL LOW (ref 35.0–45.0)
Hemoglobin: 10.1 g/dL — ABNORMAL LOW (ref 11.7–15.5)
MCH: 34.8 pg — ABNORMAL HIGH (ref 27.0–33.0)
MCHC: 34.2 g/dL (ref 32.0–36.0)
MCV: 101.7 fL — ABNORMAL HIGH (ref 80.0–100.0)
MPV: 8.8 fL (ref 7.5–12.5)
Platelets: 334 10*3/uL (ref 140–400)
RBC: 2.9 10*6/uL — ABNORMAL LOW (ref 3.80–5.10)
RDW: 14.3 % (ref 11.0–15.0)
WBC: 3.1 10*3/uL — ABNORMAL LOW (ref 3.8–10.8)

## 2023-07-21 LAB — BASIC METABOLIC PANEL WITH GFR
BUN/Creatinine Ratio: 15 (calc) (ref 6–22)
BUN: 29 mg/dL — ABNORMAL HIGH (ref 7–25)
CO2: 23 mmol/L (ref 20–32)
Calcium: 8.8 mg/dL (ref 8.6–10.4)
Chloride: 106 mmol/L (ref 98–110)
Creat: 1.96 mg/dL — ABNORMAL HIGH (ref 0.50–1.05)
Glucose, Bld: 97 mg/dL (ref 65–99)
Potassium: 3.9 mmol/L (ref 3.5–5.3)
Sodium: 140 mmol/L (ref 135–146)
eGFR: 28 mL/min/{1.73_m2} — ABNORMAL LOW (ref >=60)

## 2023-07-21 LAB — LIPASE: Lipase: 49 U/L (ref 7–60)

## 2023-07-26 ENCOUNTER — Encounter: Payer: Self-pay | Admitting: Hematology and Oncology

## 2023-07-26 NOTE — Progress Notes (Signed)
Patient called regarding assistance with medical bills. Reviewed notes and saw Michele Page in social work assisted her in the past with outside foundations. Provided her the numbers to [place] in social work.  Also, provided her [employer] grant balance and reminded expenses she could use for. Referred her back to green folder with expense sheet reminded her of what expenses she can submit for grant to cover and how to submit.  She has my card for any additional financial questions or concerns.

## 2023-07-27 ENCOUNTER — Inpatient Hospital Stay: Payer: Medicare HMO | Attending: Hematology and Oncology

## 2023-07-27 NOTE — Progress Notes (Signed)
[facility] Clinical Social Work  Clinical Social Work (CSW) returned patient's missed call from 07/26/23. Patient expressed reason for call was to discuss assistance with medical bills. Patient is currently unsure of future treatment appointments, she will clarify with provider. Patient has utilized available external resources for medical bills, CSW referred patient to billing department. Patient has not applied for medicaid at this time, uncertain if she plans to in the future.  Marguerita Merles, LCSWA Clinical Social Worker [facility]

## 2023-07-28 ENCOUNTER — Other Ambulatory Visit (HOSPITAL_COMMUNITY): Payer: Self-pay

## 2023-07-30 ENCOUNTER — Inpatient Hospital Stay: Payer: Medicare HMO

## 2023-07-30 ENCOUNTER — Encounter: Payer: Self-pay | Admitting: Hematology and Oncology

## 2023-07-30 NOTE — Progress Notes (Signed)
Patient appeared at cancer center with no appointment trying to remember whom she spoke with last.  Reviewed notes and advised patient we talked on 07/26/23 and advised of her grant balance which can be used to assist with personal household bills. Message sent to registration to provide patient another grant expense sheet. Advised her the gas cards she gets also comes from that balance. She verbalized understanding.  Michele Page in social work spoke with her on 07/27/23 and patient states she was given phone numbers to reach out to and she called regarding assistance with medical bills. I will send message to [name] to reach out to patient for follow up.  I provided patient number to [name] in Adult Medicaid to assist patient with applying for Medicaid at [phone].   She has my card for any additional financial questions or concerns.

## 2023-07-30 NOTE — Progress Notes (Signed)
[facility] CSW Progress Note  Visual merchandiser (CSW) contacted patient to follow up on in basket message from Everlean Alstrom regarding resources. CSW spoke with patient about need for paying medical bill. CSW re-examined grant options and treatment plan with patient. CSW discussed internal options with patient, patient stated that she attempted to try internal options but was not able to complete the task. CSW and patient placed application for Nicholes Rough, CSW informed patient that she not sure of acceptance possibility. However, [employer] is last option for external grants that CSW is aware of.    Marguerita Merles, LCSWA Clinical Social Worker [facility]

## 2023-08-02 ENCOUNTER — Inpatient Hospital Stay: Payer: Medicare HMO

## 2023-08-02 NOTE — Progress Notes (Signed)
[facility] CSW Progress Note  Patient contact Clinical Social Worker to provide updates on Michele Page (denial, due to dx date). CSW emailed [employer] to get clarifying information to ensure patient is not eligible. CSW completed Michele Page application for another grant, patient will come up to [facility] to sign document on Thursday.    Marguerita Merles, LCSWA Clinical Social Worker [facility]

## 2023-08-03 ENCOUNTER — Telehealth: Payer: Self-pay | Admitting: Hematology and Oncology

## 2023-08-03 NOTE — Telephone Encounter (Signed)
Patient is aware of scheduled appointment times/dates

## 2023-08-05 ENCOUNTER — Inpatient Hospital Stay: Payer: Medicare HMO

## 2023-08-05 NOTE — Progress Notes (Signed)
[facility] CSW Progress Note  Visual merchandiser met with patient to discuss grant applications. CSW and patient discussed and applied for two grants ([employer] and [employer]). CSW provided emotional support as patient discussed the impact of treatment.     Marguerita Merles, LCSWA Clinical Social Worker [facility]

## 2023-08-09 ENCOUNTER — Encounter: Payer: Self-pay | Admitting: Hematology and Oncology

## 2023-08-13 ENCOUNTER — Inpatient Hospital Stay: Payer: Medicare HMO

## 2023-08-13 NOTE — Progress Notes (Signed)
[facility] CSW Progress Note  Clinical Child psychotherapist followed up with patient on status [employer] Application. Patient confirmed approval. CSW will follow up in a couple of weeks.    Marguerita Merles, LCSWA Clinical Social Worker [facility]

## 2023-08-14 ENCOUNTER — Other Ambulatory Visit: Payer: Self-pay | Admitting: Hematology and Oncology

## 2023-08-14 ENCOUNTER — Other Ambulatory Visit: Payer: Self-pay | Admitting: Nurse Practitioner

## 2023-08-14 DIAGNOSIS — E7800 Pure hypercholesterolemia, unspecified: Secondary | ICD-10-CM

## 2023-08-26 ENCOUNTER — Other Ambulatory Visit: Payer: Self-pay

## 2023-08-26 NOTE — Progress Notes (Signed)
Specialty Pharmacy Ongoing Clinical Assessment Note  Michele Page is a 67 y.o. female who is being followed by the specialty pharmacy service for RxSp Oncology   Patient's specialty medication(s) reviewed today: Palbociclib   Missed doses in the last 4 weeks: 0   Patient/Caregiver did not have any additional questions or concerns.   Therapeutic benefit summary: Patient is achieving benefit   Adverse events/side effects summary: No adverse events/side effects   Patient's therapy is appropriate to: Continue    Goals Addressed             This Visit's Progress    Slow Disease Progression       Patient is on track. Patient will maintain adherence and adhere to provider and/or lab appointments. Per provider notes from 07/12/23, August CT showed stable disease.          Follow up:  6 months  Otto Herb Specialty Pharmacist

## 2023-08-26 NOTE — Progress Notes (Signed)
Specialty Pharmacy Refill Coordination Note  Michele Page is a 67 y.o. female contacted today regarding refills of specialty medication(s) Palbociclib   Patient requested Delivery   Delivery date: 09/02/23   Verified address: [address]   [place] [id number]   Medication will be filled on 09/01/23.

## 2023-08-30 ENCOUNTER — Ambulatory Visit: Payer: Medicare HMO | Admitting: Cardiovascular Disease

## 2023-09-13 DIAGNOSIS — C50411 Malignant neoplasm of upper-outer quadrant of right female breast: Secondary | ICD-10-CM | POA: Diagnosis not present

## 2023-09-13 DIAGNOSIS — Z170 Estrogen receptor positive status [ER+]: Secondary | ICD-10-CM | POA: Diagnosis not present

## 2023-09-21 ENCOUNTER — Other Ambulatory Visit (HOSPITAL_COMMUNITY): Payer: Self-pay | Admitting: Pharmacy Technician

## 2023-09-21 ENCOUNTER — Other Ambulatory Visit (HOSPITAL_COMMUNITY): Payer: Self-pay

## 2023-09-21 NOTE — Progress Notes (Signed)
Specialty Pharmacy Refill Coordination Note  Michele Page is a 67 y.o. female contacted today regarding refills of specialty medication(s) Palbociclib   Patient requested Delivery   Delivery date: 09/30/23   Verified address: [address] [place]   Medication will be filled on 09/29/23.  New cycle start the week of  11/18 roughly

## 2023-09-22 ENCOUNTER — Encounter: Payer: Self-pay | Admitting: Hematology and Oncology

## 2023-09-24 ENCOUNTER — Ambulatory Visit (HOSPITAL_COMMUNITY)
Admission: RE | Admit: 2023-09-24 | Discharge: 2023-09-24 | Disposition: A | Discharge status: Home / Self Care | Payer: Medicare HMO | Source: Ambulatory Visit | Attending: Hematology and Oncology | Admitting: Hematology and Oncology

## 2023-09-24 DIAGNOSIS — J439 Emphysema, unspecified: Secondary | ICD-10-CM | POA: Diagnosis not present

## 2023-09-24 DIAGNOSIS — C50912 Malignant neoplasm of unspecified site of left female breast: Secondary | ICD-10-CM | POA: Diagnosis not present

## 2023-09-24 DIAGNOSIS — C7951 Secondary malignant neoplasm of bone: Secondary | ICD-10-CM | POA: Diagnosis not present

## 2023-09-24 DIAGNOSIS — C50412 Malignant neoplasm of upper-outer quadrant of left female breast: Secondary | ICD-10-CM | POA: Insufficient documentation

## 2023-09-24 DIAGNOSIS — I700 Atherosclerosis of aorta: Secondary | ICD-10-CM | POA: Diagnosis not present

## 2023-09-24 DIAGNOSIS — Z170 Estrogen receptor positive status [ER+]: Secondary | ICD-10-CM | POA: Diagnosis not present

## 2023-09-29 ENCOUNTER — Other Ambulatory Visit: Payer: Self-pay

## 2023-10-04 ENCOUNTER — Inpatient Hospital Stay: Payer: Medicare HMO | Attending: Hematology and Oncology | Admitting: Hematology and Oncology

## 2023-10-04 ENCOUNTER — Inpatient Hospital Stay: Payer: Medicare HMO

## 2023-10-04 ENCOUNTER — Other Ambulatory Visit: Payer: Medicare HMO

## 2023-10-04 ENCOUNTER — Ambulatory Visit: Payer: Medicare HMO | Admitting: Pharmacist

## 2023-10-04 VITALS — BP 131/65 | HR 80 | Temp 98.2°F | Resp 18 | Ht 62.0 in | Wt 203.6 lb

## 2023-10-04 DIAGNOSIS — M7989 Other specified soft tissue disorders: Secondary | ICD-10-CM | POA: Diagnosis not present

## 2023-10-04 DIAGNOSIS — C50919 Malignant neoplasm of unspecified site of unspecified female breast: Secondary | ICD-10-CM

## 2023-10-04 DIAGNOSIS — C7951 Secondary malignant neoplasm of bone: Secondary | ICD-10-CM | POA: Insufficient documentation

## 2023-10-04 DIAGNOSIS — Z170 Estrogen receptor positive status [ER+]: Secondary | ICD-10-CM | POA: Diagnosis not present

## 2023-10-04 DIAGNOSIS — C50412 Malignant neoplasm of upper-outer quadrant of left female breast: Secondary | ICD-10-CM | POA: Insufficient documentation

## 2023-10-04 DIAGNOSIS — M542 Cervicalgia: Secondary | ICD-10-CM | POA: Diagnosis not present

## 2023-10-04 DIAGNOSIS — M25519 Pain in unspecified shoulder: Secondary | ICD-10-CM | POA: Diagnosis not present

## 2023-10-04 DIAGNOSIS — Z923 Personal history of irradiation: Secondary | ICD-10-CM | POA: Diagnosis not present

## 2023-10-04 DIAGNOSIS — Z79811 Long term (current) use of aromatase inhibitors: Secondary | ICD-10-CM | POA: Insufficient documentation

## 2023-10-04 NOTE — Progress Notes (Signed)
Patient Care Team: Sharon Seller, NP as PCP - General (Geriatric Medicine) Runell Gess, MD as PCP - Cardiology (Cardiology) Serena Croissant, MD as Consulting Physician (Oncology) Harriette Bouillon, MD as Consulting Physician (General Surgery)  DIAGNOSIS:  Encounter Diagnoses  Name Primary?   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, unspecified laterality ([name])     SUMMARY OF ONCOLOGIC HISTORY: Oncology History  Breast cancer of 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)  03/31/2016 Initial Diagnosis   Screening detected left breast mass, 2 nodules, 1.9 x 1.6 x 0.8 cm= fat necrosis; 8 x 7 x 7 mm= grade 2 IDC ER 100%, PR 100%, HER-2 negative ratio 1.39, Ki-67 20%   04/17/2016 Surgery   Left lumpectomy ([name]): IDC grade 2, 1.3 cm, with DCIS, margins negative, 0/4 lymph nodes negative, T1 cN0 stage IA pathologic stage, ER 100%, PR 100%, HER-2 negative ratio 1.39, Ki-67 20% Oncotype DX score 20, 13% ROR, intermediate risk   04/17/2016 Oncotype testing   Recurrence score: 20; ROR 15% (intermediate risk)    05/27/2016 - 07/13/2016 Radiation Therapy   Adjuvant radiation therapy [facility]): Left breast treated with breath hold to 50.4 Gy in 28 fractions at 1.8 Gy/fraction.  Left breast boosted to 10 Gy in 5 fractions at 2 Gy/fraction   07/23/2016 -  Anti-estrogen oral therapy   Anastrozole 1 mg switched to letrozole 04/22/2017 due to hair loss, switch to tamoxifen 10/22/2017 due to hair loss from letrozole as well   Malignant neoplasm of upper-outer quadrant of right breast in female, estrogen receptor positive ([name])  08/04/2021 Initial Diagnosis   Malignant neoplasm of upper-outer quadrant of right breast in female, estrogen receptor positive ([name])    Relapse/Recurrence   2.2 cm ILC ER 100%, PR 90%, Her 2 Neg, Ki 67: 25%, Size 2.5 cm   08/04/2021 Cancer  Staging   Staging form: Breast, AJCC 8th Edition - Clinical: Stage IB (cT2, cN0, cM0, G2, ER+, PR+, HER2-) - Signed by Serena Croissant, MD on 08/04/2021 Stage prefix: Initial diagnosis Histologic grading system: 3 grade system   09/02/2021 Surgery   Right lumpectomy: Grade 2 ILC 3.1 cm, margins negative, LVI present, 4/6 lymph nodes macrometastases, 2 lymph nodes isolated tumor cells, ER 100%, PR 90%, Ki-67 25%, HER2 negative   10/17/2021 - 01/30/2022 Chemotherapy   Patient is on Treatment Plan : BREAST Adjuvant CMF IV q21d      Genetic Testing   Negative genetic testing. No pathogenic variants identified on the [facility] CustomNext+RNA panel. The report date is 03/04/2022.  The CustomNext-Cancer+RNAinsight panel offered by [employer] includes sequencing and rearrangement analysis for the following 47 genes:  APC, ATM, AXIN2, BARD1, BMPR1A, BRCA1, BRCA2, BRIP1, CDH1, CDK4, CDKN2A, CHEK2, DICER1, EPCAM, GREM1, HOXB13, MEN1, MLH1, MSH2, MSH3, MSH6, MUTYH, NBN, NF1, NF2, NTHL1, PALB2, PMS2, POLD1, POLE, PTEN, RAD51C, RAD51D, RECQL, RET, SDHA, SDHAF2, SDHB, SDHC, SDHD, SMAD4, SMARCA4, STK11, TP53, TSC1, TSC2, and VHL.  RNA data is routinely analyzed for use in variant interpretation for all genes.     CHIEF COMPLIANT: Follow-up of metastatic breast cancer, complaining of severe neck pain  HISTORY OF PRESENT ILLNESS:  History of Present Illness   Ms. Haen, a patient with known metastatic breast cancer, presents with concerns about her ongoing treatment and new symptoms. She reports swelling in her hand that fluctuates in severity. She also describes a new  pain that originates in her neck and radiates to her shoulder. This pain is associated with a sensation of 'snapping' when she turns her neck. She has noticed these symptoms for about a month and reports that she is worsening. She also expresses concerns about the side effects of her cancer treatment, including hair loss and fatigue. She is currently  on Letrozole and Ibrance for her cancer.     ALLERGIES:  has No Known Allergies.  MEDICATIONS:  Current Outpatient Medications  Medication Sig Dispense Refill   allopurinol (ZYLOPRIM) 100 MG tablet Take 0.5 tablets (50 mg total) by mouth every other day. 30 tablet 1   aspirin EC 81 MG tablet Take 1 tablet (81 mg total) by mouth daily. 30 tablet 6   atorvastatin (LIPITOR) 20 MG tablet Take 1 tablet (20 mg total) by mouth daily. 90 tablet 3   labetalol (NORMODYNE) 300 MG tablet Take 2 tablets (600 mg total) by mouth 3 (three) times daily. 270 tablet 1   letrozole (FEMARA) 2.5 MG tablet TAKE 1 TABLET EVERY DAY 90 tablet 3   NIFEdipine (PROCARDIA XL/NIFEDICAL XL) 60 MG 24 hr tablet TAKE 1 TABLET TWICE DAILY 180 tablet 1   palbociclib (IBRANCE) 75 MG tablet Take 1 tablet (75 mg total) by mouth daily. Take for 21 days on, 7 days off, repeat every 28 days. 21 tablet 3   Vibegron (GEMTESA) 75 MG TABS Take 1 tablet (75 mg total) by mouth daily. 90 tablet 0   No current facility-administered medications for this visit.    PHYSICAL EXAMINATION: ECOG PERFORMANCE STATUS: 1 - Symptomatic but completely ambulatory  Vitals:   10/04/23 1428  BP: 131/65  Pulse: 80  Resp: 18  Temp: 98.2 F (36.8 C)  SpO2: 100%   Filed Weights   10/04/23 1428  Weight: 203 lb 9.6 oz (92.4 kg)    Physical Exam          (exam performed in the presence of a chaperone)  LABORATORY DATA:  I have reviewed the data as listed    Latest Ref Rng & Units 07/20/2023    1:43 PM 07/12/2023   11:58 AM 05/31/2023   12:51 PM  CMP  Glucose 65 - 99 mg/dL 97  027  80   BUN 7 - 25 mg/dL 29  22  25    Creatinine 0.50 - 1.05 mg/dL 2.53  6.64  4.03   Sodium 135 - 146 mmol/L 140  142  142   Potassium 3.5 - 5.3 mmol/L 3.9  3.3  3.9   Chloride 98 - 110 mmol/L 106  107  109   CO2 20 - 32 mmol/L 23  24  26    Calcium 8.6 - 10.4 mg/dL 8.8  9.5  9.5   Total Protein 6.5 - 8.1 g/dL  7.0  6.9   Total Bilirubin 0.3 - 1.2 mg/dL  0.4  0.4    Alkaline Phos 38 - 126 U/L  101  89   AST 15 - 41 U/L  17  17   ALT 0 - 44 U/L  10  11     Lab Results  Component Value Date   WBC 3.1 (L) 07/20/2023   HGB 10.1 (L) 07/20/2023   HCT 29.5 (L) 07/20/2023   MCV 101.7 (H) 07/20/2023   PLT 334 07/20/2023   NEUTROABS 2.1 07/12/2023    ASSESSMENT & PLAN:  Breast cancer of 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) 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, 1.3 cm, with  DCIS, margins negative, 0/4 lymph nodes negative, T1 cN0 stage IA pathologic stage, ER 100%, PR 100%, HER-2 negative ratio 1.39, Ki-67 20% (Originally 2 nodules were detected on screening mammogram 1.9 cm= fat necrosis and 8 mm IDC) Oncotype DX score 20, 13% risk of recurrence, intermediate risk Adjuvant radiation therapy started 07/20/2017completed 07/13/2016     09/02/2021:Right lumpectomy: Grade 2 ILC 3.1 cm, margins negative, LVI present, 4/6 lymph nodes macrometastases, 2 lymph nodes isolated tumor cells, ER 100%, PR 90%, Ki-67 25%, HER2 negative   Treatment plan/summary: 1. systemic chemotherapy with CMF x6 cycles completed 01/30/2022 2. adjuvant radiation therapy completing 04/09/2022 3.  For the continued antiestrogen therapy (previously anastrozole started 07/23/2016 switched to letrozole 2018 switched to tamoxifen 2018, switched back to anastrozole) hair loss was the main problem, may consider exemestane ------------------------------------------------------------------------------------------------------------------------------- Current treatment: Letrozole with Ibrance.  Patient could not tolerate Verzinio Signatera:  06/12/2022: Positive (0.52) 09/02/2022: Positive (68.11) Mar 22, 2023: Positive (106.54) 07/06/2023: Positive (662)   CT CAP 07/10/2022: No metastatic disease.  Tiny pulmonary nodules nonspecific. CT CAP 04/02/2023: New peripherally sclerotic lesion superior endplate L2 suspicious for treated metastasis or Schmorl's node.  No other sites of  metastatic disease identified CT CAP 07/06/2023: Subpectoral lymph node 7 mm (used to be 5 to 6 mm) ill-defined groundglass density right lower lobe bone metastases stable T11 L2 CT CAP 09/24/2023: Stable bone metastasis T11 L2  Next scan will be done in 6 months. ------------------------------------- Assessment and Plan    Metastatic Breast Cancer Stable disease on recent scan with no new or progressive disease. Treated with Letrozole and Ibrance, which may be causing side effects including hand swelling and hair thinning. -Continue current treatment regimen. -Discuss possibility of discontinuing Ibrance in 6 months due to side effects.  Hand Swelling Likely related to treatment with Letrozole and Ibrance. -Continue to monitor.  Neck and Shoulder Pain New onset, possibly musculoskeletal in nature. Not visualized on recent scans. -Order MRI of the neck in 1-2 weeks to further evaluate.  Follow-up in 3 weeks after the MRI.          Orders Placed This Encounter  Procedures   MR NECK SOFT TISSUE ONLY W WO CONTRAST    Standing Status:   Future    Standing Expiration Date:   10/03/2024    Order Specific Question:   What is the patient's sedation requirement?    Answer:   No Sedation    Order Specific Question:   Does the patient have a pacemaker or implanted devices?    Answer:   No    Order Specific Question:   Preferred imaging location?    Answer:   [facility] (table limit-550lbs)   The patient has a good understanding of the overall plan. she agrees with it. she will call with any problems that may develop before the next visit here. Total time spent: 30 mins including face to face time and time spent for planning, charting and co-ordination of care   Tamsen Meek, MD 10/04/23

## 2023-10-04 NOTE — Assessment & Plan Note (Signed)
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 systemic chemotherapy with CMF x6 cycles completed 01/30/2022 2. adjuvant radiation therapy completing 04/09/2022 3.  For the continued antiestrogen therapy (previously anastrozole started 07/23/2016 switched to letrozole 2018 switched to tamoxifen 2018, switched back to anastrozole) hair loss was the main problem, may consider exemestane ------------------------------------------------------------------------------------------------------------------------------- Current treatment: Letrozole with Ibrance.  Patient could not tolerate Verzinio Signatera:  06/12/2022: Positive (0.52) 09/02/2022: Positive (68.11) Mar 22, 2023: Positive (106.54) 07/06/2023: Positive (662)   CT CAP 07/10/2022: No metastatic disease.  Tiny pulmonary nodules nonspecific. CT CAP 04/02/2023: New peripherally sclerotic lesion superior endplate L2 suspicious for treated metastasis or Schmorl's node.  No other sites of metastatic disease identified CT CAP 07/06/2023: Subpectoral lymph node 7 mm (used to be 5 to 6 mm) ill-defined groundglass density right lower lobe bone metastases stable T11 L2 CT CAP 09/24/2023: Stable bone metastasis T11 L2  Next scan will be done in 6 months.

## 2023-10-04 NOTE — Progress Notes (Signed)
[facility] CSW Progress Note  Clinical Social Worker  returned patients VM left on 10/15  . Patient inquired about status of [employer] analyst) applied to in September 2024. This CSW did sent grant email on this date to ask for status on application. CSW informed patient that unfortunately with external grants, status and timeframe for delivery of updates is unknown to CSW. CSW informed patient that she has explored all options known to assist with medical bills. Patient verbalized understanding.  Marguerita Merles, LCSWA Clinical Social Worker [facility]

## 2023-10-05 ENCOUNTER — Telehealth: Payer: Self-pay

## 2023-10-05 NOTE — Telephone Encounter (Signed)
[facility] CSW Progress Note  Patient contact CSW to inform her of approval of a grant from the [employer]. Patient reported that she received an email with two forms to complete the grant and requested appointment for assistance. Appointment scheduled with CSW for 11/21, patient verbalized understanding.     Marguerita Merles, LCSW Clinical Social Worker [facility]

## 2023-10-07 ENCOUNTER — Inpatient Hospital Stay: Payer: Medicare HMO

## 2023-10-07 ENCOUNTER — Inpatient Hospital Stay: Payer: Medicare HMO | Admitting: Pharmacist

## 2023-10-07 ENCOUNTER — Encounter: Payer: Self-pay | Admitting: Hematology and Oncology

## 2023-10-07 ENCOUNTER — Other Ambulatory Visit: Payer: Self-pay

## 2023-10-07 DIAGNOSIS — C50412 Malignant neoplasm of upper-outer quadrant of left female breast: Secondary | ICD-10-CM

## 2023-10-07 NOTE — Progress Notes (Signed)
[facility] CSW Progress Note  Visual merchandiser  met with patient  to discuss grant funding. CSW and patient reviewed letter sent by [employer]. CSW and patient discussed treatment and upcoming appointments. Patient discussed psychosocial impact of treatment. CSW and patient called [place] [employer]illing services to identify bills that funding should be applied to. Follow up appointment scheduled for 11/25    Marguerita Merles, [place] Clinical Social Worker [facility]

## 2023-10-08 ENCOUNTER — Ambulatory Visit
Admission: RE | Admit: 2023-10-08 | Discharge: 2023-10-08 | Disposition: A | Discharge status: Home / Self Care | Payer: Medicare HMO | Source: Ambulatory Visit | Attending: Hematology and Oncology | Admitting: Hematology and Oncology

## 2023-10-08 DIAGNOSIS — M542 Cervicalgia: Secondary | ICD-10-CM

## 2023-10-08 DIAGNOSIS — E041 Nontoxic single thyroid nodule: Secondary | ICD-10-CM | POA: Diagnosis not present

## 2023-10-08 DIAGNOSIS — C50919 Malignant neoplasm of unspecified site of unspecified female breast: Secondary | ICD-10-CM

## 2023-10-08 MED ORDER — GADOPICLENOL 0.5 MMOL/ML IV SOLN
9.0000 mL | Freq: Once | INTRAVENOUS | Status: AC | PRN
  Administered 2023-10-08: 9 mL via INTRAVENOUS

## 2023-10-11 ENCOUNTER — Ambulatory Visit: Payer: Medicare HMO | Admitting: Adult Health

## 2023-10-11 ENCOUNTER — Inpatient Hospital Stay: Payer: Medicare HMO

## 2023-10-11 NOTE — Progress Notes (Signed)
[facility] CSW Progress Note  Visual merchandiser met with patient as scheduled for assistance with navigating billing. Patient was unable to stay for full visit. CSW provided written instructions of previous discussions for billing allotment. Patient verbalized understanding.   Marguerita Merles, LCSWA Clinical Social Worker [facility]

## 2023-10-13 ENCOUNTER — Encounter: Payer: Self-pay | Admitting: Family

## 2023-10-13 ENCOUNTER — Ambulatory Visit (INDEPENDENT_AMBULATORY_CARE_PROVIDER_SITE_OTHER): Payer: Medicare HMO | Admitting: Family

## 2023-10-13 VITALS — BP 122/78 | HR 75 | Temp 97.4°F | Resp 18 | Ht 62.0 in | Wt 202.2 lb

## 2023-10-13 DIAGNOSIS — M79662 Pain in left lower leg: Secondary | ICD-10-CM | POA: Diagnosis not present

## 2023-10-13 DIAGNOSIS — R1032 Left lower quadrant pain: Secondary | ICD-10-CM | POA: Diagnosis not present

## 2023-10-13 DIAGNOSIS — M79652 Pain in left thigh: Secondary | ICD-10-CM | POA: Diagnosis not present

## 2023-10-13 NOTE — Progress Notes (Signed)
Provider: Caprice Wasko FNP-C  Sharon Seller, NP  Patient Care Team: Sharon Seller, NP as PCP - General (Geriatric Medicine) Runell Gess, MD as PCP - Cardiology (Cardiology) Serena Croissant, MD as Consulting Physician (Oncology) Harriette Bouillon, MD as Consulting Physician (General Surgery) Anselm Lis, RPH-CPP as Pharmacist (Hematology and Oncology)  Extended Emergency Contact Information Primary Emergency Contact: Buccheri,Russell Address: [address]          [place], [place] [address] [place] of [place] Home Phone: [phone] Mobile Phone: [phone] Relation: Spouse  Code Status:  Full Code  Goals of care: Advanced Directive information    05/28/2023    2:43 PM  Advanced Directives  Does Patient Have a Medical Advance Directive? No  Would patient like information on creating a medical advance directive? No - Patient declined     Chief Complaint  Patient presents with   Acute Visit     left pain in upper inner thigh area     HPI:  Pt is a 67 y.o. female seen today for an acute visit for evaluation of left groin pain x 2 months.states pain radiates down to thigh and leg area.No swelling,redness or warm to touch. Has tingling on the leg. 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 She denies any fall,back pain or weakness on the leg.   Past Medical History:  Diagnosis Date   Anemia    past hx many yrs ago    Arthritis    lt knee   Breast cancer ([name])    Breast cancer of 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) 04/02/2016   Chronic kidney disease    CKD   Colon polyps    COPD (chronic obstructive pulmonary disease) ([name])    GERD (gastroesophageal reflux disease)    years ago- not current    Hyperlipidemia    Hypertension    Multiple sclerosis ([name])    Personal history of chemotherapy    Personal history  of radiation therapy    Stroke ([name])    2013, mild cognitive deficits   Vision abnormalities    Past Surgical History:  Procedure Laterality Date   BREAST BIOPSY Left 03/31/2016   BREAST BIOPSY Right 07/24/2021   BREAST LUMPECTOMY Left 04/17/2016   BREAST LUMPECTOMY Right 09/02/2021   BREAST LUMPECTOMY WITH RADIOACTIVE SEED AND SENTINEL LYMPH NODE BIOPSY Right 09/02/2021   Procedure: RIGHT BREAST LUMPECTOMY WITH RADIOACTIVE SEED AND SENTINEL LYMPH NODE BIOPSY;  Surgeon: Harriette Bouillon, MD;  Location: [facility];  Service: General;  Laterality: Right;   COLONOSCOPY     HYSTEROSCOPY WITH D & C N/A 12/19/2015   Procedure: DILATATION AND CURETTAGE /HYSTEROSCOPY;  Surgeon: Mitchel Honour, DO;  Location: [facility];  Service: Gynecology;  Laterality: N/A;   POLYPECTOMY     PORTACATH PLACEMENT Right 10/16/2021   Procedure: INSERTION PORT-A-CATH;  Surgeon: Harriette Bouillon, MD;  Location: [facility];  Service: General;  Laterality: Right;   RADIOACTIVE SEED GUIDED PARTIAL MASTECTOMY WITH AXILLARY SENTINEL LYMPH NODE BIOPSY Left 04/17/2016   Procedure: RADIOACTIVE SEED GUIDED PARTIAL MASTECTOMY WITH AXILLARY SENTINEL LYMPH NODE BIOPSY;  Surgeon: Harriette Bouillon, MD;  Location: [facility];  Service: General;  Laterality: Left;  RADIOACTIVE SEED GUIDED PARTIAL MASTECTOMY WITH AXILLARY SENTINEL LYMPH NODE BIOPSY    TUBAL LIGATION     VAGINAL DELIVERY  x3    No Known Allergies  Outpatient Encounter Medications as of 10/13/2023  Medication Sig   allopurinol (ZYLOPRIM) 100 MG tablet Take 0.5 tablets (50 mg total) by mouth every other day.   aspirin EC 81 MG tablet Take 1 tablet (81 mg total) by mouth daily.   atorvastatin (LIPITOR) 20 MG tablet Take 1 tablet (20 mg total) by mouth daily.   labetalol (NORMODYNE) 300 MG tablet Take 2 tablets (600 mg total) by mouth 3 (three) times daily.   letrozole (FEMARA) 2.5 MG tablet TAKE 1 TABLET EVERY DAY   NIFEdipine  (PROCARDIA XL/NIFEDICAL XL) 60 MG 24 hr tablet TAKE 1 TABLET TWICE DAILY   palbociclib (IBRANCE) 75 MG tablet Take 1 tablet (75 mg total) by mouth daily. Take for 21 days on, 7 days off, repeat every 28 days.   Vibegron (GEMTESA) 75 MG TABS Take 1 tablet (75 mg total) by mouth daily.   No facility-administered encounter medications on file as of 10/13/2023.    Review of Systems  Constitutional:  Negative for appetite change, chills, fatigue, fever and unexpected weight change.  Respiratory:  Negative for cough, chest tightness, shortness of breath and wheezing.   Cardiovascular:  Negative for chest pain, palpitations and leg swelling.  Gastrointestinal:  Negative for abdominal distention, abdominal pain, blood in stool, constipation, diarrhea, nausea and vomiting.  Musculoskeletal:  Negative for arthralgias, back pain, gait problem, joint swelling, myalgias, neck pain and neck stiffness.       Left groin- thigh pain   Skin:  Negative for color change, pallor and rash.  Neurological:  Negative for dizziness, syncope, speech difficulty, weakness, light-headedness, numbness and headaches.    Immunization History  Administered Date(s) Administered   Influenza,inj,Quad PF,6+ Mos 08/07/2015, 08/06/2016, 11/24/2017, 10/26/2018, 07/12/2019   PPD Test 07/13/2017   Pneumococcal Conjugate-13 04/18/2021   Pertinent  Health Maintenance Due  Topic Date Due   INFLUENZA VACCINE  06/17/2023   Colonoscopy  01/14/2024   MAMMOGRAM  09/22/2024   DEXA SCAN  04/21/2026      05/05/2023    1:47 PM 05/24/2023    9:14 AM 05/28/2023    2:40 PM 07/20/2023    1:07 PM 10/13/2023    1:57 PM  Fall Risk  Falls in the past year? 0 1 0 0 0  Was there an injury with Fall? 0  0 0 0  Fall Risk Category Calculator 0  0 0 0  Patient at Risk for Falls Due to No Fall Risks  No Fall Risks No Fall Risks No Fall Risks  Fall risk Follow up Falls evaluation completed  Falls evaluation completed Falls evaluation completed Falls  evaluation completed   Functional Status Survey:    Vitals:   10/13/23 1358  BP: 122/78  Pulse: 75  Resp: 18  Temp: (!) 97.4 F (36.3 C)  SpO2: 95%  Weight: 202 lb 3.2 oz (91.7 kg)  Height: 5\' 2"  (1.575 m)   Body mass index is 36.98 kg/m. Physical Exam Vitals reviewed.  Constitutional:      General: She is not in acute distress.    Appearance: Normal appearance. She is obese. She is not ill-appearing or diaphoretic.  HENT:     Head: Normocephalic.  Eyes:     General: No scleral icterus.       Right eye: No discharge.        Left eye: No discharge.     Conjunctiva/sclera: Conjunctivae normal.     Pupils: Pupils are equal, round,  and reactive to light.  Cardiovascular:     Rate and Rhythm: Normal rate and regular rhythm.     Pulses: Normal pulses.     Heart sounds: Normal heart sounds. No murmur heard.    No friction rub. No gallop.  Pulmonary:     Effort: Pulmonary effort is normal. No respiratory distress.     Breath sounds: Normal breath sounds. No wheezing, rhonchi or rales.  Chest:     Chest wall: No tenderness.  Abdominal:     General: Bowel sounds are normal. There is no distension.     Palpations: Abdomen is soft. There is no mass.     Tenderness: There is no abdominal tenderness. There is no right CVA tenderness, left CVA tenderness, guarding or rebound.  Musculoskeletal:        General: No swelling. Normal range of motion.     Right upper leg: Normal.     Left upper leg: Tenderness present. No swelling or edema.     Right lower leg: No edema.     Left lower leg: No edema.       Legs:  Skin:    General: Skin is warm and dry.     Coloration: Skin is not pale.     Findings: No erythema, lesion or rash.  Neurological:     Mental Status: She is alert and oriented to person, place, and time.     Motor: No weakness.     Coordination: Coordination normal.     Gait: Gait normal.  Psychiatric:        Mood and Affect: Mood normal.        Speech: Speech  normal.        Behavior: Behavior normal.     Labs reviewed: Recent Labs    05/31/23 1251 07/12/23 1158 07/20/23 1343  NA 142 142 140  K 3.9 3.3* 3.9  CL 109 107 106  CO2 26 24 23   GLUCOSE 80 161* 97  BUN 25* 22 29*  CREATININE 1.89* 1.94* 1.96*  CALCIUM 9.5 9.5 8.8   Recent Labs    05/03/23 0952 05/31/23 1251 07/12/23 1158  AST 27 17 17   ALT 18 11 10   ALKPHOS 76 89 101  BILITOT 1.1 0.4 0.4  PROT 7.6 6.9 7.0  ALBUMIN 4.0 4.3 4.4   Recent Labs    05/03/23 0952 05/31/23 1251 07/12/23 1158 07/20/23 1343  WBC 3.1* 2.5* 3.1* 3.1*  NEUTROABS 2.3 1.4* 2.1  --   HGB 11.0* 9.9* 10.7* 10.1*  HCT 32.7* 28.8* 30.6* 29.5*  MCV 102.5* 100.3* 100.3* 101.7*  PLT 261 300 197 334   Lab Results  Component Value Date   TSH 1.70 06/24/2018   Lab Results  Component Value Date   HGBA1C 5.8 (H) 05/13/2020   Lab Results  Component Value Date   CHOL 150 04/19/2023   HDL 75 04/19/2023   LDLCALC 57 04/19/2023   TRIG 92 04/19/2023   CHOLHDL 2.0 04/19/2023    Significant Diagnostic Results in last 30 days:  CT CHEST ABDOMEN PELVIS WO CONTRAST  Result Date: 10/04/2023 CLINICAL DATA:  Metastatic left breast cancer EXAM: CT CHEST, ABDOMEN AND PELVIS WITHOUT CONTRAST TECHNIQUE: Multidetector CT imaging of the chest, abdomen and pelvis was performed following the standard protocol without IV contrast. RADIATION DOSE REDUCTION: This exam was performed according to the departmental dose-optimization program which includes automated exposure control, adjustment of the mA and/or kV according to patient size and/or use of iterative reconstruction technique.  COMPARISON:  07/06/2023 FINDINGS: CT CHEST FINDINGS Cardiovascular: The heart is normal in size. Trace inferior pericardial fluid. No evidence of thoracic aortic aneurysm. Atherosclerotic calcifications of the aortic arch. Severe three-vessel coronary atherosclerosis. Mediastinum/Nodes: No suspicious mediastinal lymphadenopathy. Status  post right axillary lymph node dissection. Stable 1.9 cm left thyroid nodule (series 2/image 6), better evaluated on prior thyroid ultrasound. This has been evaluated on previous imaging. (ref: J Am Coll Radiol. 2015 Feb;12(2): 143-50). Lungs/Pleura: Mild subpleural scarring in the bilateral upper lobes. Prior right lower lobe ground-glass opacity has resolved, likely infectious inflammatory. No focal consolidation. Mild centrilobular and paraseptal emphysematous changes, upper lung predominant. No suspicious pulmonary nodules. No pleural effusion or pneumothorax. Musculoskeletal: Status post bilateral breast lumpectomy. Stable treated osseous metastasis at T11 (sagittal image 110). CT ABDOMEN PELVIS FINDINGS Hepatobiliary: Unenhanced liver is unremarkable. Gallbladder is unremarkable. No intrahepatic or extrahepatic dilatation but Pancreas: Within normal limits. Spleen: Within normal limits. Adrenals/Urinary Tract: Adrenal glands are within normal limits. Kidneys are within normal limits. No renal, ureteral, or bladder calculi. No hydronephrosis. Bladder is underdistended but unremarkable. Stomach/Bowel: Stomach is within normal limits. No evidence of bowel obstruction. Normal appendix (series 2/image 72). Extensive left colonic diverticulosis, without diverticulitis. Vascular/Lymphatic: No evidence of abdominal aortic aneurysm. Atherosclerotic calcifications of the abdominal aorta and branch vessels. No suspicious abdominopelvic lymphadenopathy. Reproductive: Uterus is grossly unremarkable. Bilateral ovaries are normal limits. Other: No abdominopelvic ascites. Musculoskeletal: Stable treated os metastasis at L2 (sagittal image 113). IMPRESSION: Status post bilateral breast lumpectomy. Status post right axillary lymph node dissection. Stable treated osseous metastases at T11 and L2. No findings suspicious for new or progressive metastatic disease. Aortic Atherosclerosis (ICD10-I70.0) and Emphysema (ICD10-J43.9).  Electronically Signed   By: Charline Bills M.D.   On: 10/04/2023 02:11    Assessment/Plan  1. Groin pain, left Tender to palpation without any erythema or swelling noted - VAS [place] LOWER EXTREMITY VENOUS (DVT); Future  2. Pain of left thigh Tender to palpation without any erythema or swelling noted  - Advised to get left femur X-ray at [facility] imaging at [facility] then will call you with results. - DG FEMUR MIN 2 VIEWS LEFT; Future - VAS [place] LOWER EXTREMITY VENOUS (DVT); Future  3. Pain of left calf Lateral outer calf area slight tender to palpation brownish bruise progressing healing noted.No erythema or swelling noted. - VAS [place] LOWER EXTREMITY VENOUS (DVT); Future  Family/ staff Communication: Reviewed plan of care with patient  Labs/tests ordered:  - VAS [place] LOWER EXTREMITY VENOUS (DVT); Future - DG FEMUR MIN 2 VIEWS LEFT; Future  Next Appointment: Return if symptoms worsen or fail to improve.   Caesar Bookman, NP

## 2023-10-13 NOTE — Patient Instructions (Signed)
-   Please get left thigh X-ray at [facility] imaging at [facility] then will call you with results. - Notify provider or go to Ed  if symptoms worsen

## 2023-10-18 ENCOUNTER — Ambulatory Visit
Admission: RE | Admit: 2023-10-18 | Discharge: 2023-10-18 | Disposition: A | Discharge status: Home / Self Care | Payer: Medicare HMO | Source: Ambulatory Visit | Attending: Family | Admitting: Family

## 2023-10-18 DIAGNOSIS — M79652 Pain in left thigh: Secondary | ICD-10-CM | POA: Diagnosis not present

## 2023-10-19 ENCOUNTER — Ambulatory Visit (HOSPITAL_COMMUNITY)
Admission: RE | Admit: 2023-10-19 | Discharge: 2023-10-19 | Disposition: A | Discharge status: Home / Self Care | Payer: Medicare HMO | Source: Ambulatory Visit | Attending: Nurse Practitioner | Admitting: Nurse Practitioner

## 2023-10-19 ENCOUNTER — Other Ambulatory Visit (HOSPITAL_COMMUNITY): Payer: Self-pay | Admitting: Pharmacy Technician

## 2023-10-19 ENCOUNTER — Other Ambulatory Visit: Payer: Self-pay

## 2023-10-19 ENCOUNTER — Other Ambulatory Visit: Payer: Self-pay | Admitting: Hematology and Oncology

## 2023-10-19 ENCOUNTER — Other Ambulatory Visit (HOSPITAL_COMMUNITY): Payer: Self-pay

## 2023-10-19 DIAGNOSIS — M79652 Pain in left thigh: Secondary | ICD-10-CM | POA: Diagnosis not present

## 2023-10-19 DIAGNOSIS — R1032 Left lower quadrant pain: Secondary | ICD-10-CM

## 2023-10-19 DIAGNOSIS — M79662 Pain in left lower leg: Secondary | ICD-10-CM

## 2023-10-19 MED ORDER — PALBOCICLIB 75 MG PO TABS
75.0000 mg | ORAL_TABLET | Freq: Every day | ORAL | 3 refills | Status: DC
  Filled 2023-10-19: qty 21, 28d supply, fill #0
  Filled 2023-11-16: qty 21, 28d supply, fill #1
  Filled 2023-12-16: qty 21, 28d supply, fill #2
  Filled 2024-01-13: qty 21, 28d supply, fill #3

## 2023-10-19 NOTE — Progress Notes (Signed)
Specialty Pharmacy Refill Coordination Note  Michele Page is a 67 y.o. female contacted today regarding refills of specialty medication(s) Palbociclib   Patient requested Delivery   Delivery date: 10/28/23   Verified address: [address]  [place]   Medication will be filled on 10/27/23.  Refill request sent to Md; Call if any delays

## 2023-10-19 NOTE — Progress Notes (Signed)
Left lower extremity venous duplex has been completed. Preliminary results can be found in CV Proc through chart review.  Results were faxed to Dinah Ngetich NP.  10/19/23 12:13 PM Olen Cordial RVT

## 2023-10-20 ENCOUNTER — Telehealth: Payer: Medicare HMO | Admitting: Nurse Practitioner

## 2023-10-20 NOTE — Telephone Encounter (Signed)
Patient dropped off Parking Placard Form to be completed and signed. Need a call when done  Given to Chrae in CI

## 2023-10-21 NOTE — Telephone Encounter (Signed)
Demographic information inserted, form placed in provider review and sign folder with attached problem(diagnosis) list

## 2023-10-22 ENCOUNTER — Other Ambulatory Visit: Payer: Self-pay | Admitting: Family

## 2023-10-22 DIAGNOSIS — M79652 Pain in left thigh: Secondary | ICD-10-CM

## 2023-10-22 NOTE — Telephone Encounter (Signed)
Handicap Placard Form completed and signed.  Patient notified and will pick up.  Placed up front with patient's name.  Copy sent to scanning.

## 2023-10-25 ENCOUNTER — Other Ambulatory Visit: Payer: Self-pay | Admitting: Hematology and Oncology

## 2023-10-25 DIAGNOSIS — Z9889 Other specified postprocedural states: Secondary | ICD-10-CM

## 2023-10-27 ENCOUNTER — Ambulatory Visit
Admission: RE | Admit: 2023-10-27 | Discharge: 2023-10-27 | Disposition: A | Discharge status: Home / Self Care | Payer: Medicare HMO | Source: Ambulatory Visit | Attending: Hematology and Oncology | Admitting: Hematology and Oncology

## 2023-10-27 ENCOUNTER — Other Ambulatory Visit: Payer: Self-pay

## 2023-10-27 DIAGNOSIS — Z9889 Other specified postprocedural states: Secondary | ICD-10-CM

## 2023-10-27 DIAGNOSIS — Z08 Encounter for follow-up examination after completed treatment for malignant neoplasm: Secondary | ICD-10-CM | POA: Diagnosis not present

## 2023-10-27 DIAGNOSIS — Z853 Personal history of malignant neoplasm of breast: Secondary | ICD-10-CM | POA: Diagnosis not present

## 2023-10-28 ENCOUNTER — Inpatient Hospital Stay: Payer: Medicare HMO | Attending: Hematology and Oncology | Admitting: Hematology and Oncology

## 2023-10-28 VITALS — BP 143/63 | HR 87 | Temp 97.3°F | Resp 18 | Ht 62.0 in | Wt 203.4 lb

## 2023-10-28 DIAGNOSIS — Z1732 Human epidermal growth factor receptor 2 negative status: Secondary | ICD-10-CM | POA: Insufficient documentation

## 2023-10-28 DIAGNOSIS — C50411 Malignant neoplasm of upper-outer quadrant of right female breast: Secondary | ICD-10-CM | POA: Insufficient documentation

## 2023-10-28 DIAGNOSIS — Z79811 Long term (current) use of aromatase inhibitors: Secondary | ICD-10-CM | POA: Insufficient documentation

## 2023-10-28 DIAGNOSIS — C50412 Malignant neoplasm of upper-outer quadrant of left female breast: Secondary | ICD-10-CM

## 2023-10-28 DIAGNOSIS — C7951 Secondary malignant neoplasm of bone: Secondary | ICD-10-CM | POA: Diagnosis not present

## 2023-10-28 DIAGNOSIS — Z170 Estrogen receptor positive status [ER+]: Secondary | ICD-10-CM | POA: Insufficient documentation

## 2023-10-28 NOTE — Assessment & Plan Note (Signed)
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 systemic chemotherapy with CMF x6 cycles completed 01/30/2022 2. adjuvant radiation therapy completing 04/09/2022 3.  For the continued antiestrogen therapy (previously anastrozole started 07/23/2016 switched to letrozole 2018 switched to tamoxifen 2018, switched back to anastrozole) hair loss was the main problem, may consider exemestane ------------------------------------------------------------------------------------------------------------------------------- Current treatment: Letrozole with Ibrance.  Patient could not tolerate Verzinio Signatera:  06/12/2022: Positive (0.52) 09/02/2022: Positive (68.11) Mar 22, 2023: Positive (106.54) 07/06/2023: Positive (662)   CT CAP 07/10/2022: No metastatic disease.  Tiny pulmonary nodules nonspecific. CT CAP 04/02/2023: New peripherally sclerotic lesion superior endplate L2 suspicious for treated metastasis or Schmorl's node.  No other sites of metastatic disease identified CT CAP 07/06/2023: Subpectoral lymph node 7 mm (used to be 5 to 6 mm) ill-defined groundglass density right lower lobe bone metastases stable T11 L2 CT CAP 09/24/2023: Stable bone metastasis T11 L2 MRI neck: 10/26/2023: Left thyroid nodule 2.5 cm unchanged, no findings in the neck 10/27/2023: Left leg x-rays: Mild osteoarthritis   Next scan will be done in  6 months.

## 2023-10-28 NOTE — Progress Notes (Signed)
Patient Care Team: Sharon Seller, NP as PCP - General (Geriatric Medicine) Runell Gess, MD as PCP - Cardiology (Cardiology) Serena Croissant, MD as Consulting Physician (Oncology) Harriette Bouillon, MD as Consulting Physician (General Surgery) Anselm Lis, RPH-CPP as Pharmacist (Hematology and Oncology)  DIAGNOSIS:  Encounter Diagnosis  Name Primary?   Malignant neoplasm of upper-outer quadrant of left breast in female, estrogen receptor positive ([name]) Yes    SUMMARY OF ONCOLOGIC HISTORY: Oncology History  Breast cancer of 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)  03/31/2016 Initial Diagnosis   Screening detected left breast mass, 2 nodules, 1.9 x 1.6 x 0.8 cm= fat necrosis; 8 x 7 x 7 mm= grade 2 IDC ER 100%, PR 100%, HER-2 negative ratio 1.39, Ki-67 20%   04/17/2016 Surgery   Left lumpectomy ([name]): IDC grade 2, 1.3 cm, with DCIS, margins negative, 0/4 lymph nodes negative, T1 cN0 stage IA pathologic stage, ER 100%, PR 100%, HER-2 negative ratio 1.39, Ki-67 20% Oncotype DX score 20, 13% ROR, intermediate risk   04/17/2016 Oncotype testing   Recurrence score: 20; ROR 15% (intermediate risk)    05/27/2016 - 07/13/2016 Radiation Therapy   Adjuvant radiation therapy [facility]): Left breast treated with breath hold to 50.4 Gy in 28 fractions at 1.8 Gy/fraction.  Left breast boosted to 10 Gy in 5 fractions at 2 Gy/fraction   07/23/2016 -  Anti-estrogen oral therapy   Anastrozole 1 mg switched to letrozole 04/22/2017 due to hair loss, switch to tamoxifen 10/22/2017 due to hair loss from letrozole as well   Malignant neoplasm of upper-outer quadrant of right breast in female, estrogen receptor positive ([name])  08/04/2021 Initial Diagnosis   Malignant neoplasm of upper-outer quadrant of right breast in female, estrogen receptor positive ([name])    Relapse/Recurrence   2.2 cm ILC ER 100%, PR 90%, Her 2 Neg, Ki 67: 25%, Size 2.5 cm   08/04/2021 Cancer Staging   Staging form:  Breast, AJCC 8th Edition - Clinical: Stage IB (cT2, cN0, cM0, G2, ER+, PR+, HER2-) - Signed by Serena Croissant, MD on 08/04/2021 Stage prefix: Initial diagnosis Histologic grading system: 3 grade system   09/02/2021 Surgery   Right lumpectomy: Grade 2 ILC 3.1 cm, margins negative, LVI present, 4/6 lymph nodes macrometastases, 2 lymph nodes isolated tumor cells, ER 100%, PR 90%, Ki-67 25%, HER2 negative   10/17/2021 - 01/30/2022 Chemotherapy   Patient is on Treatment Plan : BREAST Adjuvant CMF IV q21d      Genetic Testing   Negative genetic testing. No pathogenic variants identified on the [facility] CustomNext+RNA panel. The report date is 03/04/2022.  The CustomNext-Cancer+RNAinsight panel offered by [employer] includes sequencing and rearrangement analysis for the following 47 genes:  APC, ATM, AXIN2, BARD1, BMPR1A, BRCA1, BRCA2, BRIP1, CDH1, CDK4, CDKN2A, CHEK2, DICER1, EPCAM, GREM1, HOXB13, MEN1, MLH1, MSH2, MSH3, MSH6, MUTYH, NBN, NF1, NF2, NTHL1, PALB2, PMS2, POLD1, POLE, PTEN, RAD51C, RAD51D, RECQL, RET, SDHA, SDHAF2, SDHB, SDHC, SDHD, SMAD4, SMARCA4, STK11, TP53, TSC1, TSC2, and VHL.  RNA data is routinely analyzed for use in variant interpretation for all genes.     CHIEF COMPLIANT: Follow-up of metastatic breast cancer on Ibrance and letrozole  HISTORY OF PRESENT ILLNESS:  History of Present Illness   The patient, with a history of cancer, presents for a follow-up visit. She is currently on Ibrance and reports no adverse effects from the medication. The patient expresses concern about the presence of lumps in the breast and other parts of the body. She  recently had a mammogram, which showed no visible abnormalities. The patient also mentions a previous report indicating two tumors in the breast, but the doctor clarifies that these were not in the breast but subpectoral. The patient also mentions a small something on her side, but the doctor could not find any abnormalities in the area in  previous scans. The patient's cancer marker levels have decreased, which the patient attributes to the [place].      ALLERGIES:  has no known allergies.  MEDICATIONS:  Current Outpatient Medications  Medication Sig Dispense Refill   allopurinol (ZYLOPRIM) 100 MG tablet Take 0.5 tablets (50 mg total) by mouth every other day. 30 tablet 1   aspirin EC 81 MG tablet Take 1 tablet (81 mg total) by mouth daily. 30 tablet 6   atorvastatin (LIPITOR) 20 MG tablet Take 1 tablet (20 mg total) by mouth daily. 90 tablet 3   labetalol (NORMODYNE) 300 MG tablet Take 2 tablets (600 mg total) by mouth 3 (three) times daily. 270 tablet 1   letrozole (FEMARA) 2.5 MG tablet TAKE 1 TABLET EVERY DAY 90 tablet 3   NIFEdipine (PROCARDIA XL/NIFEDICAL XL) 60 MG 24 hr tablet TAKE 1 TABLET TWICE DAILY 180 tablet 1   palbociclib (IBRANCE) 75 MG tablet Take 1 tablet (75 mg total) by mouth daily. Take for 21 days on, 7 days off, repeat every 28 days. 21 tablet 3   Vibegron (GEMTESA) 75 MG TABS Take 1 tablet (75 mg total) by mouth daily. 90 tablet 0   No current facility-administered medications for this visit.    PHYSICAL EXAMINATION: ECOG PERFORMANCE STATUS: 1 - Symptomatic but completely ambulatory  Vitals:   10/28/23 1014  BP: (!) 143/63  Pulse: 87  Resp: 18  Temp: (!) 97.3 F (36.3 C)  SpO2: 97%   Filed Weights   10/28/23 1014  Weight: 203 lb 6.4 oz (92.3 kg)     LABORATORY DATA:  I have reviewed the data as listed    Latest Ref Rng & Units 07/20/2023    1:43 PM 07/12/2023   11:58 AM 05/31/2023   12:51 PM  CMP  Glucose 65 - 99 mg/dL 97  433  80   BUN 7 - 25 mg/dL 29  22  25    Creatinine 0.50 - 1.05 mg/dL 2.95  1.88  4.16   Sodium 135 - 146 mmol/L 140  142  142   Potassium 3.5 - 5.3 mmol/L 3.9  3.3  3.9   Chloride 98 - 110 mmol/L 106  107  109   CO2 20 - 32 mmol/L 23  24  26    Calcium 8.6 - 10.4 mg/dL 8.8  9.5  9.5   Total Protein 6.5 - 8.1 g/dL  7.0  6.9   Total Bilirubin 0.3 - 1.2 mg/dL  0.4   0.4   Alkaline Phos 38 - 126 U/L  101  89   AST 15 - 41 U/L  17  17   ALT 0 - 44 U/L  10  11     Lab Results  Component Value Date   WBC 3.1 (L) 07/20/2023   HGB 10.1 (L) 07/20/2023   HCT 29.5 (L) 07/20/2023   MCV 101.7 (H) 07/20/2023   PLT 334 07/20/2023   NEUTROABS 2.1 07/12/2023    ASSESSMENT & PLAN:  Breast cancer of 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) 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, 1.3 cm, with DCIS, margins negative, 0/4 lymph nodes negative, T1 cN0 stage  IA pathologic stage, ER 100%, PR 100%, HER-2 negative ratio 1.39, Ki-67 20% (Originally 2 nodules were detected on screening mammogram 1.9 cm= fat necrosis and 8 mm IDC) Oncotype DX score 20, 13% risk of recurrence, intermediate risk Adjuvant radiation therapy started 07/20/2017completed 07/13/2016     09/02/2021:Right lumpectomy: Grade 2 ILC 3.1 cm, margins negative, LVI present, 4/6 lymph nodes macrometastases, 2 lymph nodes isolated tumor cells, ER 100%, PR 90%, Ki-67 25%, HER2 negative   Treatment plan/summary: 1. systemic chemotherapy with CMF x6 cycles completed 01/30/2022 2. adjuvant radiation therapy completing 04/09/2022 3.  For the continued antiestrogen therapy (previously anastrozole started 07/23/2016 switched to letrozole 2018 switched to tamoxifen 2018, switched back to anastrozole) hair loss was the main problem, may consider exemestane ------------------------------------------------------------------------------------------------------------------------------- Current treatment: Letrozole with Ibrance.  Patient could not tolerate Verzinio Signatera:  06/12/2022: Positive (0.52) 09/02/2022: Positive (68.11) Mar 22, 2023: Positive (106.54) 07/06/2023: Positive (662)   CT CAP 07/10/2022: No metastatic disease.  Tiny pulmonary nodules nonspecific. CT CAP 04/02/2023: New peripherally sclerotic lesion superior endplate L2 suspicious for treated metastasis or Schmorl's node.  No other sites of  metastatic disease identified CT CAP 07/06/2023: Subpectoral lymph node 7 mm (used to be 5 to 6 mm) ill-defined groundglass density right lower lobe bone metastases stable T11 L2 CT CAP 09/24/2023: Stable bone metastasis T11 L2 MRI neck: 10/26/2023: Left thyroid nodule 2.5 cm unchanged, no findings in the neck 10/27/2023: Left leg x-rays: Mild osteoarthritis   I discussed with the patient apart from the T11 and L2 vertebral bodies she has pectoral lymph node as the only evidence of metastatic disease.      No orders of the defined types were placed in this encounter.  The patient has a good understanding of the overall plan. she agrees with it. she will call with any problems that may develop before the next visit here. Total time spent: 30 mins including face to face time and time spent for planning, charting and co-ordination of care   Tamsen Meek, MD 10/28/23

## 2023-11-08 ENCOUNTER — Telehealth: Payer: Self-pay | Admitting: Pharmacy Technician

## 2023-11-08 ENCOUNTER — Encounter: Payer: Self-pay | Admitting: Sports Medicine

## 2023-11-08 ENCOUNTER — Other Ambulatory Visit (HOSPITAL_COMMUNITY): Payer: Self-pay

## 2023-11-08 ENCOUNTER — Encounter: Payer: Self-pay | Admitting: Hematology and Oncology

## 2023-11-08 ENCOUNTER — Ambulatory Visit: Payer: Medicare HMO | Admitting: Sports Medicine

## 2023-11-08 DIAGNOSIS — R29898 Other symptoms and signs involving the musculoskeletal system: Secondary | ICD-10-CM | POA: Diagnosis not present

## 2023-11-08 DIAGNOSIS — M1612 Unilateral primary osteoarthritis, left hip: Secondary | ICD-10-CM | POA: Diagnosis not present

## 2023-11-08 DIAGNOSIS — M79652 Pain in left thigh: Secondary | ICD-10-CM | POA: Diagnosis not present

## 2023-11-08 NOTE — Progress Notes (Signed)
Patient says that she had pain in the left thigh for the last couple of months that has since gone away. She now only has pain that is deep through the hip, mostly through the groin but does wrap around the side and back of the hip. Patient says that it hurts most when raising her leg (hip flexion). She does not take any pain medications and has not tried ice or heat. Patient denies any known injury recently or in the past. She said she had xrays of her leg when the thigh was bothering her but does not remember having a full evaluation done of the leg.  Patient was instructed in 10 minutes of therapeutic exercises for left hip to improve strength, ROM and function according to my instructions and plan of care by a Certified Athletic Trainer during the office visit. A customized handout was provided and demonstration of proper technique shown and discussed. Patient did perform exercises and demonstrate understanding through teachback.  All questions discussed and answered.

## 2023-11-08 NOTE — Progress Notes (Signed)
[name] - 67 y.o. female MRN [id number]  Date of birth: [birth year]  Office Visit Note: Visit Date: 11/08/2023 PCP: Sharon Seller, NP Referred by: Caesar Bookman, NP  Subjective: Chief Complaint  Patient presents with   Left Leg - Pain   HPI: Michele Page is a pleasant 67 y.o. female who presents today for chronic left thigh and groin pain.  Michele Page has had pain in the left thigh and the left groin for the last 2 months.  She feels a deep ache within the hip that wraps into the groin.  In the past she did have pain in the thigh but this has improved with just time and rest.  She is not taking any medications for this nor done any therapy.  She did have an x-ray by her primary physician.  She does have blood pressure issues and takes medication for this.  She has been told to avoid NSAIDs given her CKD.  Her pain used to be quite severe but her thigh pain has essentially resolved but she still continues with point in the groin, worse with lifting up the leg/hip.  Lab Results  Component Value Date   CREATININE 1.96 (H) 07/20/2023    Pertinent ROS were reviewed with the patient and found to be negative unless otherwise specified above in HPI.   Assessment & Plan: Visit Diagnoses:  1. Unilateral primary osteoarthritis, left hip   2. Pain of left thigh   3. Weakness of left hip    Plan: Impression is chronic left hip osteoarthritis with exacerbation.  There is also concomitant weakness of the lateral hip abductor muscles of the hip.  She has had increasing pain in the thigh but continues with pain within the groin and hip joint, which is reproducible with provocative motions on physical exam today.  She would rather avoid medication, needs to avoid NSAIDs given her CKD and hypertension. I am ok with Tylenol, heat as needed.  We discussed the importance of maintaining hip stabilization and strength, specifically her hip abductors, she prefers home therapy versus formalized PT.  We  did print out a customized hip stabilization and lateral hip abductor protocol, my athletic trainer Isabelle Course did review these in the room with her today.  She is to perform these once daily and may progress as tolerated.  We discussed the option for ultrasound-guided intra-articular hip injection, she will follow-up in a few weeks and if not making progress with her HEP, may proceed with a intra-articular hip injection. We will reevaluate in the next 2-3 weeks.  Follow-up: Return in about 2 weeks (around 11/22/2023) for f/u L-hip, consider inj.   Meds & Orders: No orders of the defined types were placed in this encounter.  No orders of the defined types were placed in this encounter.    Procedures: No procedures performed      Clinical History: No specialty comments available.  She reports that she quit smoking about 18 years ago. Her smoking use included cigarettes. She started smoking about 44 years ago. She has a 12.5 pack-year smoking history. She has never used smokeless tobacco.  Recent Labs    04/19/23 1332  LABURIC 8.9*    Objective:    Physical Exam  Gen: Well-appearing, in no acute distress; non-toxic CV:  Well-perfused. Warm.  Resp: Breathing unlabored on room air; no wheezing. Psych: Fluid speech in conversation; appropriate affect; normal thought process Neuro: Sensation intact throughout. No gross coordination deficits.   Ortho Exam -  Left hip: No redness swelling or effusion.  No significant TTP over the greater trochanter or ASIS.  There is fluid range of motion with internal and external logroll without any restriction.  Positive Stinchfield, positive FADIR test, negative FABER test.  Patient walks with a mild Trendelenburg towards the contralateral side.  Imaging:  *Independent review and interpretation of the left femur including AP and lateral proximally and distally near the knee from 12/28/2022 was performed by myself today.  X-rays demonstrate mild osteoarthritic  change about the hip with a small spur over the acetabular rim.  Still relatively well-preserved with space within the hip joint itself.  Femoral head and neck in good alignment without collapse.  There is mild to moderate patellofemoral arthritic change of the knee.  Scattered calcifications along the iliac and femoral arteries likely atherosclerotic in nature.  Narrative & Impression  CLINICAL DATA:  Left groin and left thigh pain.   EXAM: LEFT FEMUR 2 VIEWS   COMPARISON:  None Available.   FINDINGS: No fracture.  No bone lesion.   Hip and knee joints are normally spaced and aligned. Marginal osteophytes are noted at the femoral tibial joint space compartments.   Scattered atherosclerotic calcifications are noted along the iliac and femoral arteries. Soft tissues otherwise unremarkable.   IMPRESSION: 1. No fracture or acute finding. 2. Mild knee joint osteoarthritis.     Electronically Signed   By: Amie Portland M.D.   On: 10/27/2023 13:36    Past Medical/Family/Surgical/Social History: Medications & Allergies reviewed per EMR, new medications updated. Patient Active Problem List   Diagnosis Date Noted   Genetic testing 03/05/2022   Port-A-Cath in place 11/06/2021   Malignant neoplasm of upper-outer quadrant of right breast in female, estrogen receptor positive ([name]) 08/04/2021   Class 2 severe obesity due to excess calories with serious comorbidity and body mass index (BMI) of 37.0 to 37.9 in adult [facility] 11/18/2020   Neck pain on right side 06/01/2018   History of breast cancer 11/25/2017   Breast cancer of 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) 04/02/2016   Essential hypertension 01/10/2016   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 01/10/2016   Gastroesophageal reflux disease without esophagitis 01/10/2016   Hyperlipidemia 01/10/2016   CKD (chronic kidney disease) 01/10/2016   Multiple sclerosis ([name]) 03/20/2015   Accelerated  hypertension 03/20/2015   Cerebral infarction ([name]) 03/20/2015   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/2015   Past Medical History:  Diagnosis Date   Anemia    past hx many yrs ago    Arthritis    lt knee   Breast cancer ([name])    Breast cancer of 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) 04/02/2016   Chronic kidney disease    CKD   Colon polyps    COPD (chronic obstructive pulmonary disease) ([name])    GERD (gastroesophageal reflux disease)    years ago- not current    Hyperlipidemia    Hypertension    Multiple sclerosis ([name])    Personal history of chemotherapy    Personal history of radiation therapy    Stroke ([name])    2013, mild cognitive deficits   Vision abnormalities    Family History  Problem Relation Age of Onset   Hypertension Mother    Stroke Mother    Stroke Sister    Alcohol abuse Brother    HIV/AIDS Brother    Breast cancer Cousin        dx 74s  Colon cancer Neg Hx    Colon polyps Neg Hx    Esophageal cancer Neg Hx    Rectal cancer Neg Hx    Stomach cancer Neg Hx    Past Surgical History:  Procedure Laterality Date   BREAST BIOPSY Left 03/31/2016   BREAST BIOPSY Right 07/24/2021   BREAST LUMPECTOMY Left 04/17/2016   BREAST LUMPECTOMY Right 09/02/2021   BREAST LUMPECTOMY WITH RADIOACTIVE SEED AND SENTINEL LYMPH NODE BIOPSY Right 09/02/2021   Procedure: RIGHT BREAST LUMPECTOMY WITH RADIOACTIVE SEED AND SENTINEL LYMPH NODE BIOPSY;  Surgeon: Harriette Bouillon, MD;  Location: [facility];  Service: General;  Laterality: Right;   COLONOSCOPY     HYSTEROSCOPY WITH D & C N/A 12/19/2015   Procedure: DILATATION AND CURETTAGE /HYSTEROSCOPY;  Surgeon: Mitchel Honour, DO;  Location: [facility];  Service: Gynecology;  Laterality: N/A;   POLYPECTOMY     PORTACATH PLACEMENT Right 10/16/2021   Procedure: INSERTION PORT-A-CATH;  Surgeon: Harriette Bouillon, MD;  Location: [facility];  Service: General;  Laterality:  Right;   RADIOACTIVE SEED GUIDED PARTIAL MASTECTOMY WITH AXILLARY SENTINEL LYMPH NODE BIOPSY Left 04/17/2016   Procedure: RADIOACTIVE SEED GUIDED PARTIAL MASTECTOMY WITH AXILLARY SENTINEL LYMPH NODE BIOPSY;  Surgeon: Harriette Bouillon, MD;  Location: [facility];  Service: General;  Laterality: Left;  RADIOACTIVE SEED GUIDED PARTIAL MASTECTOMY WITH AXILLARY SENTINEL LYMPH NODE BIOPSY    TUBAL LIGATION     VAGINAL DELIVERY     x3   Social History   Occupational History   Occupation: retired  Tobacco Use   Smoking status: Former    Current packs/day: 0.00    Average packs/day: 0.5 packs/day for 25.0 years (12.5 ttl pk-yrs)    Types: Cigarettes    Start date: 11/17/1979    Quit date: 11/16/2004    Years since quitting: 18.9   Smokeless tobacco: Never  Vaping Use   Vaping status: Never Used  Substance and Sexual Activity   Alcohol use: Not Currently    Comment: occas   Drug use: No   Sexual activity: Yes    Comment: 1st intercourse 13 yo-5 partners

## 2023-11-08 NOTE — Telephone Encounter (Signed)
Oral Oncology Patient Advocate Encounter  Was successful in securing patient a $15,000 grant from [employer] to provide copayment coverage for Michele Page.  This will keep the out of pocket expense at $0.     [employer] ID: [id number]  I have spoken with the patient.   The billing information is as follows and has been shared with [facility].    RxBin: [id number] PCN: [id number] Member ID: [id number] Group ID: [id number] Dates of Eligibility: 10/28/23 through 10/26/24  Fund:  Breast  Michele Page, CPhT-Adv Oncology Pharmacy Patient Advocate [facility] Direct Number: [phone]  Fax: [phone]

## 2023-11-16 ENCOUNTER — Other Ambulatory Visit: Payer: Self-pay

## 2023-11-16 NOTE — Progress Notes (Signed)
 Specialty Pharmacy Refill Coordination Note  Michele Page is a 67 y.o. female contacted today regarding refills of specialty medication(s) Palbociclib  (IBRANCE )   Patient requested Delivery   Delivery date: 11/24/23   Verified address: [address]   Medication will be filled on 11/23/23.

## 2023-11-18 DIAGNOSIS — M1812 Unilateral primary osteoarthritis of first carpometacarpal joint, left hand: Secondary | ICD-10-CM | POA: Diagnosis not present

## 2023-11-23 ENCOUNTER — Ambulatory Visit: Payer: Medicare HMO | Admitting: Cardiovascular Disease

## 2023-11-23 ENCOUNTER — Other Ambulatory Visit: Payer: Self-pay

## 2023-11-25 ENCOUNTER — Ambulatory Visit: Payer: Medicare HMO | Admitting: Sports Medicine

## 2023-11-29 ENCOUNTER — Encounter: Payer: Self-pay | Admitting: Nurse Practitioner

## 2023-11-29 ENCOUNTER — Ambulatory Visit (INDEPENDENT_AMBULATORY_CARE_PROVIDER_SITE_OTHER): Payer: Medicare HMO | Admitting: Nurse Practitioner

## 2023-11-29 VITALS — BP 138/76 | HR 78 | Temp 96.9°F | Ht 62.0 in | Wt 206.0 lb

## 2023-11-29 DIAGNOSIS — N3281 Overactive bladder: Secondary | ICD-10-CM

## 2023-11-29 DIAGNOSIS — C50412 Malignant neoplasm of upper-outer quadrant of left female breast: Secondary | ICD-10-CM | POA: Diagnosis not present

## 2023-11-29 DIAGNOSIS — M1A0420 Idiopathic chronic gout, left hand, without tophus (tophi): Secondary | ICD-10-CM

## 2023-11-29 DIAGNOSIS — I10 Essential (primary) hypertension: Secondary | ICD-10-CM | POA: Diagnosis not present

## 2023-11-29 DIAGNOSIS — D539 Nutritional anemia, unspecified: Secondary | ICD-10-CM

## 2023-11-29 DIAGNOSIS — Z170 Estrogen receptor positive status [ER+]: Secondary | ICD-10-CM | POA: Diagnosis not present

## 2023-11-29 DIAGNOSIS — F5101 Primary insomnia: Secondary | ICD-10-CM | POA: Diagnosis not present

## 2023-11-29 MED ORDER — ZOLPIDEM TARTRATE 5 MG PO TABS: 5.0000 mg | ORAL_TABLET | Freq: Every evening | ORAL | 0 refills | Status: DC | PRN

## 2023-11-29 NOTE — Progress Notes (Signed)
 Careteam: Patient Care Team: Caro Harlene POUR, NP as PCP - General (Geriatric Medicine) Court Dorn PARAS, MD as PCP - Cardiology (Cardiology) Odean Potts, MD as Consulting Physician (Oncology) Vanderbilt Ned, MD as Consulting Physician (General Surgery) Lucila Norleen LABOR, RPH-CPP as Pharmacist (Hematology and Oncology)  PLACE OF SERVICE:  [facility]  Advanced Directive information Does Patient Have a Medical Advance Directive?: No, Would patient like information on creating a medical advance directive?: No - Patient declined  No Known Allergies  Chief Complaint  Patient presents with   Medical Management of Chronic Issues    6 month follow-up. Discuss need for need flu vaccine, covid booster,shingrix, and colonoscopy.      HPI: Patient is a 68 y.o. female for routine follow up.   She was seen due to pain in left groin and thigh. Feels like it was due to overuse at work.   Hx of breast cancer- continues on letrozole  and ibrance .   Has gain weight since last visit.  She reports she does not eat excessively.  Feels like she is more active at work, does not feel like she is gaining weight.  She will drink a soda every now and then.   Starting exercise.   No recent gout flares  Plans to get colonoscopy when due. Will call and schedule appt.   She is having trouble sleeping again- previously on lunesta  but willing to try ambein.   Review of Systems:  Review of Systems  Constitutional:  Negative for chills, fever and weight loss.  HENT:  Negative for tinnitus.   Respiratory:  Negative for cough, sputum production and shortness of breath.   Cardiovascular:  Negative for chest pain, palpitations and leg swelling.  Gastrointestinal:  Negative for abdominal pain, constipation, diarrhea and heartburn.  Genitourinary:  Negative for dysuria, frequency and urgency.  Musculoskeletal:  Negative for back pain, falls, joint pain and myalgias.  Skin: Negative.    Neurological:  Negative for dizziness and headaches.  Psychiatric/Behavioral:  Negative for depression and memory loss. The patient has insomnia.     Past Medical History:  Diagnosis Date   Anemia    past hx many yrs ago    Arthritis    lt knee   Breast cancer ([name])    Breast cancer of 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) 04/02/2016   Chronic kidney disease    CKD   Colon polyps    COPD (chronic obstructive pulmonary disease) ([name])    GERD (gastroesophageal reflux disease)    years ago- not current    Hyperlipidemia    Hypertension    Multiple sclerosis ([name])    Personal history of chemotherapy    Personal history of radiation therapy    Stroke ([name])    2013, mild cognitive deficits   Vision abnormalities    Past Surgical History:  Procedure Laterality Date   BREAST BIOPSY Left 03/31/2016   BREAST BIOPSY Right 07/24/2021   BREAST LUMPECTOMY Left 04/17/2016   BREAST LUMPECTOMY Right 09/02/2021   BREAST LUMPECTOMY WITH RADIOACTIVE SEED AND SENTINEL LYMPH NODE BIOPSY Right 09/02/2021   Procedure: RIGHT BREAST LUMPECTOMY WITH RADIOACTIVE SEED AND SENTINEL LYMPH NODE BIOPSY;  Surgeon: Vanderbilt Ned, MD;  Location: [facility];  Service: General;  Laterality: Right;   COLONOSCOPY     HYSTEROSCOPY WITH D & C N/A 12/19/2015   Procedure: DILATATION AND CURETTAGE /HYSTEROSCOPY;  Surgeon: Duwaine Blumenthal, DO;  Location: [facility];  Service: Gynecology;  Laterality: N/A;  POLYPECTOMY     PORTACATH PLACEMENT Right 10/16/2021   Procedure: INSERTION PORT-A-CATH;  Surgeon: Vanderbilt Ned, MD;  Location: [facility];  Service: General;  Laterality: Right;   RADIOACTIVE SEED GUIDED PARTIAL MASTECTOMY WITH AXILLARY SENTINEL LYMPH NODE BIOPSY Left 04/17/2016   Procedure: RADIOACTIVE SEED GUIDED PARTIAL MASTECTOMY WITH AXILLARY SENTINEL LYMPH NODE BIOPSY;  Surgeon: Ned Vanderbilt, MD;  Location: [facility];  Service: General;  Laterality:  Left;  RADIOACTIVE SEED GUIDED PARTIAL MASTECTOMY WITH AXILLARY SENTINEL LYMPH NODE BIOPSY    TUBAL LIGATION     VAGINAL DELIVERY     x3   Social History:   reports that she quit smoking about 19 years ago. Her smoking use included cigarettes. She started smoking about 44 years ago. She has a 12.5 pack-year smoking history. She has never used smokeless tobacco. She reports current alcohol use. She reports that she does not use drugs.  Family History  Problem Relation Age of Onset   Hypertension Mother    Stroke Mother    Stroke Sister    Alcohol abuse Brother    HIV/AIDS Brother    Breast cancer Cousin        dx 53s   Colon cancer Neg Hx    Colon polyps Neg Hx    Esophageal cancer Neg Hx    Rectal cancer Neg Hx    Stomach cancer Neg Hx     Medications: Patient's Medications  New Prescriptions   No medications on file  Previous Medications   ALLOPURINOL  (ZYLOPRIM ) 100 MG TABLET    Take 0.5 tablets (50 mg total) by mouth every other day.   ASPIRIN  EC 81 MG TABLET    Take 1 tablet (81 mg total) by mouth daily.   ATORVASTATIN  (LIPITOR) 20 MG TABLET    Take 1 tablet (20 mg total) by mouth daily.   LABETALOL  (NORMODYNE ) 300 MG TABLET    Take 2 tablets (600 mg total) by mouth 3 (three) times daily.   LETROZOLE  (FEMARA ) 2.5 MG TABLET    TAKE 1 TABLET EVERY DAY   NIFEDIPINE  (PROCARDIA  XL/NIFEDICAL XL) 60 MG 24 HR TABLET    TAKE 1 TABLET TWICE DAILY   PALBOCICLIB  (IBRANCE ) 75 MG TABLET    Take 1 tablet (75 mg total) by mouth daily. Take for 21 days on, 7 days off, repeat every 28 days.   VIBEGRON  (GEMTESA ) 75 MG TABS    Take 1 tablet (75 mg total) by mouth daily.  Modified Medications   No medications on file  Discontinued Medications   No medications on file    Physical Exam:  Vitals:   11/29/23 1309 11/29/23 1311  BP: (!) 140/70 138/76  Pulse: 78   Temp: (!) 96.9 F (36.1 C)   TempSrc: Temporal   SpO2: 95%   Weight: 206 lb (93.4 kg)   Height: 5' 2 (1.575 m)    Body  mass index is 37.68 kg/m. Wt Readings from Last 3 Encounters:  11/29/23 206 lb (93.4 kg)  10/28/23 203 lb 6.4 oz (92.3 kg)  10/13/23 202 lb 3.2 oz (91.7 kg)    Physical Exam Constitutional:      General: She is not in acute distress.    Appearance: She is well-developed. She is not diaphoretic.  HENT:     Head: Normocephalic and atraumatic.     Mouth/Throat:     Pharynx: No oropharyngeal exudate.  Eyes:     Conjunctiva/sclera: Conjunctivae normal.     Pupils: Pupils  are equal, round, and reactive to light.  Cardiovascular:     Rate and Rhythm: Normal rate and regular rhythm.     Heart sounds: Normal heart sounds.  Pulmonary:     Effort: Pulmonary effort is normal.     Breath sounds: Normal breath sounds.  Abdominal:     General: Bowel sounds are normal.     Palpations: Abdomen is soft.  Musculoskeletal:     Cervical back: Normal range of motion and neck supple.     Right lower leg: No edema.     Left lower leg: No edema.  Skin:    General: Skin is warm and dry.  Neurological:     Mental Status: She is alert.  Psychiatric:        Mood and Affect: Mood normal.     Labs reviewed: Basic Metabolic Panel: Recent Labs    05/31/23 1251 07/12/23 1158 07/20/23 1343  NA 142 142 140  K 3.9 3.3* 3.9  CL 109 107 106  CO2 26 24 23   GLUCOSE 80 161* 97  BUN 25* 22 29*  CREATININE 1.89* 1.94* 1.96*  CALCIUM  9.5 9.5 8.8   Liver Function Tests: Recent Labs    05/03/23 0952 05/31/23 1251 07/12/23 1158  AST 27 17 17   ALT 18 11 10   ALKPHOS 76 89 101  BILITOT 1.1 0.4 0.4  PROT 7.6 6.9 7.0  ALBUMIN  4.0 4.3 4.4   Recent Labs    07/20/23 1343  LIPASE 49   No results for input(s): AMMONIA in the last 8760 hours. CBC: Recent Labs    05/03/23 0952 05/31/23 1251 07/12/23 1158 07/20/23 1343  WBC 3.1* 2.5* 3.1* 3.1*  NEUTROABS 2.3 1.4* 2.1  --   HGB 11.0* 9.9* 10.7* 10.1*  HCT 32.7* 28.8* 30.6* 29.5*  MCV 102.5* 100.3* 100.3* 101.7*  PLT 261 300 197 334    Lipid Panel: Recent Labs    04/19/23 1332  CHOL 150  HDL 75  LDLCALC 57  TRIG 92  CHOLHDL 2.0   TSH: No results for input(s): TSH in the last 8760 hours. A1C: Lab Results  Component Value Date   HGBA1C 5.8 (H) 05/13/2020     Assessment/Plan 1. Macrocytic anemia (Primary) -ongoing anemia  - CBC with Differential/Platelet - Vitamin B12  2. Overactive bladder -stable on gemtesa   3. Essential hypertension -Blood pressure well controlled, goal bp <140/90 Continue current medications and dietary modifications follow metabolic panel - Complete Metabolic Panel with eGFR  4. Malignant neoplasm of upper-outer quadrant of left breast in female, estrogen receptor positive ([name]) Ongoing-continues to follow up with oncology  5. Chronic gout of left hand, unspecified cause -no recent flare - Uric Acid  6. Primary insomnia Worsening of symptoms.  - zolpidem  (AMBIEN ) 5 MG tablet; Take 1 tablet (5 mg total) by mouth at bedtime as needed for sleep.  Dispense: 30 tablet; Refill: 0   Return in about 6 months (around 05/28/2024) for routine follow up.  Emmanual Gauthreaux K. Caro BODILY [facility] [phone]

## 2023-11-29 NOTE — Patient Instructions (Signed)
 To call Dr Rhea Belton office, [facility] to schedule colonoscopy

## 2023-11-30 LAB — CBC WITH DIFFERENTIAL/PLATELET
Absolute Lymphocytes: 878 {cells}/uL (ref 850–3900)
Absolute Monocytes: 394 {cells}/uL (ref 200–950)
Basophils Absolute: 51 {cells}/uL (ref 0–200)
Basophils Relative: 1.3 %
Eosinophils Absolute: 51 {cells}/uL (ref 15–500)
Eosinophils Relative: 1.3 %
HCT: 29.6 % — ABNORMAL LOW (ref 35.0–45.0)
Hemoglobin: 10.3 g/dL — ABNORMAL LOW (ref 11.7–15.5)
MCH: 35.2 pg — ABNORMAL HIGH (ref 27.0–33.0)
MCHC: 34.8 g/dL (ref 32.0–36.0)
MCV: 101 fL — ABNORMAL HIGH (ref 80.0–100.0)
MPV: 9.3 fL (ref 7.5–12.5)
Monocytes Relative: 10.1 %
Neutro Abs: 2527 {cells}/uL (ref 1500–7800)
Neutrophils Relative %: 64.8 %
Platelets: 274 10*3/uL (ref 140–400)
RBC: 2.93 10*6/uL — ABNORMAL LOW (ref 3.80–5.10)
RDW: 13.3 % (ref 11.0–15.0)
Total Lymphocyte: 22.5 %
WBC: 3.9 10*3/uL (ref 3.8–10.8)

## 2023-11-30 LAB — COMPLETE METABOLIC PANEL WITH GFR
AG Ratio: 1.8 (calc) (ref 1.0–2.5)
ALT: 11 U/L (ref 6–29)
AST: 15 U/L (ref 10–35)
Albumin: 4.5 g/dL (ref 3.6–5.1)
Alkaline phosphatase (APISO): 111 U/L (ref 37–153)
BUN/Creatinine Ratio: 13 (calc) (ref 6–22)
BUN: 27 mg/dL — ABNORMAL HIGH (ref 7–25)
CO2: 24 mmol/L (ref 20–32)
Calcium: 9.6 mg/dL (ref 8.6–10.4)
Chloride: 106 mmol/L (ref 98–110)
Creat: 2.01 mg/dL — ABNORMAL HIGH (ref 0.50–1.05)
Globulin: 2.5 g/dL (ref 1.9–3.7)
Glucose, Bld: 89 mg/dL (ref 65–139)
Potassium: 3.9 mmol/L (ref 3.5–5.3)
Sodium: 140 mmol/L (ref 135–146)
Total Bilirubin: 0.3 mg/dL (ref 0.2–1.2)
Total Protein: 7 g/dL (ref 6.1–8.1)
eGFR: 27 mL/min/{1.73_m2} — ABNORMAL LOW (ref >=60)

## 2023-11-30 LAB — URIC ACID: Uric Acid, Serum: 8.3 mg/dL — ABNORMAL HIGH (ref 2.5–7.0)

## 2023-11-30 LAB — VITAMIN B12: Vitamin B-12: 488 pg/mL (ref 200–1100)

## 2023-12-02 ENCOUNTER — Other Ambulatory Visit: Payer: Self-pay

## 2023-12-08 ENCOUNTER — Other Ambulatory Visit: Payer: Self-pay | Admitting: Nurse Practitioner

## 2023-12-08 DIAGNOSIS — I10 Essential (primary) hypertension: Secondary | ICD-10-CM

## 2023-12-08 NOTE — Telephone Encounter (Signed)
High Risk Warning Populated when attempting to refill, I will send to Provider for further review 

## 2023-12-09 ENCOUNTER — Ambulatory Visit: Payer: Medicare HMO | Admitting: Sports Medicine

## 2023-12-09 ENCOUNTER — Other Ambulatory Visit: Payer: Self-pay

## 2023-12-09 ENCOUNTER — Other Ambulatory Visit (INDEPENDENT_AMBULATORY_CARE_PROVIDER_SITE_OTHER): Payer: Medicare HMO

## 2023-12-09 ENCOUNTER — Encounter: Payer: Self-pay | Admitting: Sports Medicine

## 2023-12-09 DIAGNOSIS — G8929 Other chronic pain: Secondary | ICD-10-CM

## 2023-12-09 DIAGNOSIS — M25552 Pain in left hip: Secondary | ICD-10-CM

## 2023-12-09 DIAGNOSIS — M1612 Unilateral primary osteoarthritis, left hip: Secondary | ICD-10-CM

## 2023-12-09 DIAGNOSIS — M25551 Pain in right hip: Secondary | ICD-10-CM | POA: Diagnosis not present

## 2023-12-09 MED ORDER — LIDOCAINE HCL 1 % IJ SOLN
4.0000 mL | INTRAMUSCULAR | Status: AC | PRN
  Administered 2023-12-09: 4 mL

## 2023-12-09 MED ORDER — METHYLPREDNISOLONE ACETATE 40 MG/ML IJ SUSP
40.0000 mg | INTRAMUSCULAR | Status: AC | PRN
  Administered 2023-12-09: 40 mg via INTRA_ARTICULAR

## 2023-12-09 NOTE — Progress Notes (Signed)
Patient says that about 1 month ago she had pain in the right hip but it has gone away completely. She says that she does have some stiffness in the left leg, and pain in the front of the left hip when she lifts her leg. She says that she does not take any pain medication for the hip, or use ice/heat.

## 2023-12-09 NOTE — Progress Notes (Signed)
Michele Page - 68 y.o. female MRN [id number]  Date of birth: 1956-06-17  Office Visit Note: Visit Date: 12/09/2023 PCP: Sharon Seller, NP Referred by: Sharon Seller, NP  Subjective: Chief Complaint  Patient presents with   Left Hip - Pain   HPI: Michele Page is a pleasant 68 y.o. female who presents today for chronic left hip pain, sometimes right hip pain.  [name] has had pain in the left hip which goes into the groin and left thigh for the past few months, this has waxed and waned.  A few weeks ago she was having more right-sided hip pain but this has went away.  She notes pain in the left hip when she tries to lift her leg or flex at the hip.  This is not present all the time, she is not taking any specific medications for this.  She has done some of her home hip exercises about once sometimes twice weekly.  Pertinent ROS were reviewed with the patient and found to be negative unless otherwise specified above in HPI.   Assessment & Plan: Visit Diagnoses:  1. Unilateral primary osteoarthritis, left hip   2. Chronic hip pain, bilateral    Plan: Impression is chronic left hip pain with at least mild osteoarthritis that is symptomatic.  She also has some weakness with hip flexion and her abductor muscles. Through shared decision-making, we did proceed with ultrasound-guided intra-articular left hip injection, tolerated well.  Advised on 48 hours of modified rest/activity and then she may return to her home hip stabilization exercises and activity as desired.  I did discuss that her home rehab for both hips should be continued for at least 6 to 8 weeks to start seeing strength improvements.  She may use ice/heat or over-the-counter Tylenol as needed for any postinjection pain.  We will see her back in about 2 months to reevaluate her hips.  If she is still having some weakness or difficulty, may consider formalized physical therapy, continue HEP for now  Follow-up: Return in about  2 months (around 02/06/2024) for For L > R hip .   Meds & Orders: No orders of the defined types were placed in this encounter.   Orders Placed This Encounter  Procedures   Large Joint Inj: L hip joint   XR HIP UNILAT W OR W/O PELVIS 2-3 VIEWS LEFT   US Guided Needle Placement - No Linked Charges     Procedures: Large Joint Inj: L hip joint on 12/09/2023 10:24 AM Indications: pain Details: 22 G 3.5 in needle, ultrasound-guided anterior approach Medications: 4 mL lidocaine 1 %; 40 mg methylPREDNISolone acetate 40 MG/ML Outcome: tolerated well, no immediate complications  Procedure: US-guided intra-articular hip injection, Left After discussion on risks/benefits/indications and informed verbal consent was obtained, a timeout was performed. Patient was lying supine on exam table. The hip was cleaned with betadine and alcohol swabs. Then utilizing ultrasound guidance, the patient's femoral head and neck junction was identified and subsequently injected with 4:2 lidocaine:depomedrol via an in-plane approach with ultrasound visualization of the injectate administered into the hip joint. Patient tolerated procedure well without immediate complications.  Procedure, treatment alternatives, risks and benefits explained, specific risks discussed. Consent was given by the patient. Immediately prior to procedure a time out was called to verify the correct patient, procedure, equipment, support staff and site/side marked as required. Patient was prepped and draped in the usual sterile fashion.  Clinical History: No specialty comments available.  She reports that she quit smoking about 19 years ago. Her smoking use included cigarettes. She started smoking about 44 years ago. She has a 12.5 pack-year smoking history. She has never used smokeless tobacco.  Recent Labs    04/19/23 1332 11/29/23 1335  LABURIC 8.9* 8.3*    Objective:    Physical Exam  Gen: Well-appearing, in no acute  distress; non-toxic CV: Well-perfused. Warm.  Resp: Breathing unlabored on room air; no wheezing. Psych: Fluid speech in conversation; appropriate affect; normal thought process  Ortho Exam -Left hip: No greater trochanter TTP.  There is fluid range of motion with internal and external logroll.  There is weakness secondary to pain to left hip flexion.  Positive FADIR and positive Stinchfield test.   Imaging: XR HIP UNILAT W OR W/O PELVIS 2-3 VIEWS LEFT Result Date: 12/09/2023 2 views of the left hip including AP and lateral film were ordered and reviewed by myself.  There is mild osteoarthritic change about the left hip.  There is a small spur off the greater trochanter.  The right hip has minimal arthritic changes.  No acute fracture or otherwise acute bony abnormality noted.  Scattered vascular calcification.   Past Medical/Family/Surgical/Social History: Medications & Allergies reviewed per EMR, new medications updated. Patient Active Problem List   Diagnosis Date Noted   Genetic testing 03/05/2022   Port-A-Cath in place 11/06/2021   Malignant neoplasm of upper-outer quadrant of right breast in female, estrogen receptor positive ([name]) 08/04/2021   Class 2 severe obesity due to excess calories with serious comorbidity and body mass index (BMI) of 37.0 to 37.9 in adult [facility]) 11/18/2020   Neck pain on right side 06/01/2018   History of breast cancer 11/25/2017   Breast cancer of 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) 04/02/2016   Essential hypertension 01/10/2016   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 01/10/2016   Gastroesophageal reflux disease without esophagitis 01/10/2016   Hyperlipidemia 01/10/2016   CKD (chronic kidney disease) 01/10/2016   Multiple sclerosis ([name]) 03/20/2015   Accelerated hypertension 03/20/2015   Cerebral infarction ([name]) 03/20/2015   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/2015   Past Medical  History:  Diagnosis Date   Anemia    past hx many yrs ago    Arthritis    lt knee   Breast cancer ([name])    Breast cancer of 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) 04/02/2016   Chronic kidney disease    CKD   Colon polyps    COPD (chronic obstructive pulmonary disease) ([name])    GERD (gastroesophageal reflux disease)    years ago- not current    Hyperlipidemia    Hypertension    Multiple sclerosis ([name])    Personal history of chemotherapy    Personal history of radiation therapy    Stroke ([name])    2013, mild cognitive deficits   Vision abnormalities    Family History  Problem Relation Age of Onset   Hypertension Mother    Stroke Mother    Stroke Sister    Alcohol abuse Brother    HIV/AIDS Brother    Breast cancer Cousin        dx 38s   Colon cancer Neg Hx    Colon polyps Neg Hx    Esophageal cancer Neg Hx    Rectal cancer Neg Hx    Stomach cancer Neg Hx    Past Surgical History:  Procedure Laterality  Date   BREAST BIOPSY Left 03/31/2016   BREAST BIOPSY Right 07/24/2021   BREAST LUMPECTOMY Left 04/17/2016   BREAST LUMPECTOMY Right 09/02/2021   BREAST LUMPECTOMY WITH RADIOACTIVE SEED AND SENTINEL LYMPH NODE BIOPSY Right 09/02/2021   Procedure: RIGHT BREAST LUMPECTOMY WITH RADIOACTIVE SEED AND SENTINEL LYMPH NODE BIOPSY;  Surgeon: Harriette Bouillon, MD;  Location: [facility];  Service: General;  Laterality: Right;   COLONOSCOPY     HYSTEROSCOPY WITH D & C N/A 12/19/2015   Procedure: DILATATION AND CURETTAGE /HYSTEROSCOPY;  Surgeon: Mitchel Honour, DO;  Location: [facility];  Service: Gynecology;  Laterality: N/A;   POLYPECTOMY     PORTACATH PLACEMENT Right 10/16/2021   Procedure: INSERTION PORT-A-CATH;  Surgeon: Harriette Bouillon, MD;  Location: [facility];  Service: General;  Laterality: Right;   RADIOACTIVE SEED GUIDED PARTIAL MASTECTOMY WITH AXILLARY SENTINEL LYMPH NODE BIOPSY Left 04/17/2016   Procedure: RADIOACTIVE SEED GUIDED PARTIAL  MASTECTOMY WITH AXILLARY SENTINEL LYMPH NODE BIOPSY;  Surgeon: Harriette Bouillon, MD;  Location: [facility];  Service: General;  Laterality: Left;  RADIOACTIVE SEED GUIDED PARTIAL MASTECTOMY WITH AXILLARY SENTINEL LYMPH NODE BIOPSY    TUBAL LIGATION     VAGINAL DELIVERY     x3   Social History   Occupational History   Occupation: retired  Tobacco Use   Smoking status: Former    Current packs/day: 0.00    Average packs/day: 0.5 packs/day for 25.0 years (12.5 ttl pk-yrs)    Types: Cigarettes    Start date: 11/17/1979    Quit date: 11/16/2004    Years since quitting: 19.0   Smokeless tobacco: Never  Vaping Use   Vaping status: Never Used  Substance and Sexual Activity   Alcohol use: Yes    Comment: occas   Drug use: No   Sexual activity: Yes    Comment: 1st intercourse 13 yo-5 partners

## 2023-12-14 ENCOUNTER — Other Ambulatory Visit: Payer: Self-pay

## 2023-12-14 MED ORDER — NIFEDIPINE ER OSMOTIC RELEASE 60 MG PO TB24: 60.0000 mg | ORAL_TABLET | Freq: Two times a day (BID) | ORAL | 0 refills | Status: DC

## 2023-12-14 NOTE — Telephone Encounter (Signed)
High Risk Warning Populated when attempting to refill, I will send to Provider for further review.  Patient call this morning stating that she is in need of her Nifedipine medication immediately because blood pressure is high this morning in the 185 and that she understand the medication will not be able to arrive until Friday. Rx Refill for 30 day supply is being sent to the [facility] on [address] but Rx Refill is pending for Sharon Seller, NP to sign.   Message sent to Sharon Seller, NP

## 2023-12-15 ENCOUNTER — Emergency Department (HOSPITAL_COMMUNITY)
Admission: EM | Admit: 2023-12-15 | Discharge: 2023-12-15 | Disposition: A | Discharge status: Home / Self Care | Payer: Medicare HMO | Attending: Student | Admitting: Student

## 2023-12-15 ENCOUNTER — Emergency Department (HOSPITAL_COMMUNITY): Payer: Medicare HMO

## 2023-12-15 ENCOUNTER — Other Ambulatory Visit: Payer: Self-pay

## 2023-12-15 DIAGNOSIS — K429 Umbilical hernia without obstruction or gangrene: Secondary | ICD-10-CM | POA: Diagnosis not present

## 2023-12-15 DIAGNOSIS — X58XXXA Exposure to other specified factors, initial encounter: Secondary | ICD-10-CM | POA: Diagnosis not present

## 2023-12-15 DIAGNOSIS — N3000 Acute cystitis without hematuria: Secondary | ICD-10-CM | POA: Diagnosis not present

## 2023-12-15 DIAGNOSIS — S39012A Strain of muscle, fascia and tendon of lower back, initial encounter: Secondary | ICD-10-CM | POA: Insufficient documentation

## 2023-12-15 DIAGNOSIS — K449 Diaphragmatic hernia without obstruction or gangrene: Secondary | ICD-10-CM | POA: Diagnosis not present

## 2023-12-15 DIAGNOSIS — R109 Unspecified abdominal pain: Secondary | ICD-10-CM | POA: Diagnosis not present

## 2023-12-15 DIAGNOSIS — Z7982 Long term (current) use of aspirin: Secondary | ICD-10-CM | POA: Diagnosis not present

## 2023-12-15 DIAGNOSIS — S3992XA Unspecified injury of lower back, initial encounter: Secondary | ICD-10-CM | POA: Diagnosis present

## 2023-12-15 DIAGNOSIS — K5730 Diverticulosis of large intestine without perforation or abscess without bleeding: Secondary | ICD-10-CM | POA: Diagnosis not present

## 2023-12-15 DIAGNOSIS — N189 Chronic kidney disease, unspecified: Secondary | ICD-10-CM | POA: Insufficient documentation

## 2023-12-15 DIAGNOSIS — I129 Hypertensive chronic kidney disease with stage 1 through stage 4 chronic kidney disease, or unspecified chronic kidney disease: Secondary | ICD-10-CM | POA: Insufficient documentation

## 2023-12-15 LAB — URINALYSIS, ROUTINE W REFLEX MICROSCOPIC
Bacteria, UA: NONE SEEN
Bilirubin Urine: NEGATIVE
Glucose, UA: NEGATIVE mg/dL
Hgb urine dipstick: NEGATIVE
Ketones, ur: NEGATIVE mg/dL
Nitrite: NEGATIVE
Protein, ur: 30 mg/dL — AB
Specific Gravity, Urine: 1.027 (ref 1.005–1.030)
pH: 5 (ref 5.0–8.0)

## 2023-12-15 LAB — HEPATIC FUNCTION PANEL
ALT: 16 U/L (ref 0–44)
AST: 22 U/L (ref 15–41)
Albumin: 3.7 g/dL (ref 3.5–5.0)
Alkaline Phosphatase: 89 U/L (ref 38–126)
Bilirubin, Direct: <0.1 mg/dL (ref 0.0–0.2)
Total Bilirubin: 0.5 mg/dL (ref 0.0–1.2)
Total Protein: 7.2 g/dL (ref 6.5–8.1)

## 2023-12-15 LAB — CBC
HCT: 30.7 % — ABNORMAL LOW (ref 36.0–46.0)
Hemoglobin: 10.1 g/dL — ABNORMAL LOW (ref 12.0–15.0)
MCH: 34.5 pg — ABNORMAL HIGH (ref 26.0–34.0)
MCHC: 32.9 g/dL (ref 30.0–36.0)
MCV: 104.8 fL — ABNORMAL HIGH (ref 80.0–100.0)
Platelets: 270 10*3/uL (ref 150–400)
RBC: 2.93 MIL/uL — ABNORMAL LOW (ref 3.87–5.11)
RDW: 13.5 % (ref 11.5–15.5)
WBC: 3.9 10*3/uL — ABNORMAL LOW (ref 4.0–10.5)
nRBC: 0 % (ref 0.0–0.2)

## 2023-12-15 LAB — BASIC METABOLIC PANEL
Anion gap: 11 (ref 5–15)
BUN: 39 mg/dL — ABNORMAL HIGH (ref 8–23)
CO2: 22 mmol/L (ref 22–32)
Calcium: 9.1 mg/dL (ref 8.9–10.3)
Chloride: 103 mmol/L (ref 98–111)
Creatinine, Ser: 2.26 mg/dL — ABNORMAL HIGH (ref 0.44–1.00)
GFR, Estimated: 23 mL/min — ABNORMAL LOW (ref >60)
Glucose, Bld: 104 mg/dL — ABNORMAL HIGH (ref 70–99)
Potassium: 3.5 mmol/L (ref 3.5–5.1)
Sodium: 136 mmol/L (ref 135–145)

## 2023-12-15 MED ORDER — METHOCARBAMOL 500 MG PO TABS: 500.0000 mg | ORAL_TABLET | Freq: Two times a day (BID) | ORAL | 0 refills | Status: DC

## 2023-12-15 MED ORDER — MORPHINE SULFATE (PF) 4 MG/ML IV SOLN
4.0000 mg | Freq: Once | INTRAVENOUS | Status: AC
  Administered 2023-12-15: 4 mg via INTRAVENOUS
  Filled 2023-12-15: qty 1

## 2023-12-15 MED ORDER — CEFADROXIL 500 MG PO CAPS
500.0000 mg | ORAL_CAPSULE | Freq: Every day | ORAL | 0 refills | Status: DC
Start: 2023-12-15 — End: 2024-01-20

## 2023-12-15 MED ORDER — SODIUM CHLORIDE 0.9 % IV SOLN
1.0000 g | Freq: Once | INTRAVENOUS | Status: AC
  Administered 2023-12-15: 1 g via INTRAVENOUS
  Filled 2023-12-15: qty 10

## 2023-12-15 MED ORDER — SODIUM CHLORIDE 0.9 % IV BOLUS
1000.0000 mL | Freq: Once | INTRAVENOUS | Status: AC
  Administered 2023-12-15: 1000 mL via INTRAVENOUS

## 2023-12-15 MED ORDER — METHOCARBAMOL 500 MG PO TABS
500.0000 mg | ORAL_TABLET | Freq: Once | ORAL | Status: DC
  Filled 2023-12-15: qty 1

## 2023-12-15 MED ORDER — HYDROMORPHONE HCL 1 MG/ML IJ SOLN
1.0000 mg | Freq: Once | INTRAMUSCULAR | Status: AC
  Administered 2023-12-15: 1 mg via INTRAVENOUS
  Filled 2023-12-15: qty 1

## 2023-12-15 NOTE — ED Provider Triage Note (Signed)
Emergency Medicine Provider Triage Evaluation Note  Michele Page , a 68 y.o. female  was evaluated in triage.  Pt complains of right flank pain.  Patient reports waking up with right flank pain.  Pain has been waxing and waning all day.  It is currently not bad.  She denies any urinary symptoms.  No change to bowel habits.  No fevers.  Denies any pain to the abdomen.  Review of Systems  Positive: Right flank pain Negative: Abdominal pain  Physical Exam  BP (!) 143/88 (BP Location: Left Arm)   Pulse 73   Temp 97.9 F (36.6 C) (Oral)   Resp 18   Ht 5\' 3"  (1.6 m)   Wt 81.3 kg   SpO2 99%   BMI 31.74 kg/m  Gen:   Awake, no distress   Resp:  Normal effort  MSK:   Moves extremities without difficulty  Other:    Medical Decision Making  Medically screening exam initiated at 5:38 PM.  Appropriate orders placed.  KARUNA BALDUCCI was informed that the remainder of the evaluation will be completed by another provider, this initial triage assessment does not replace that evaluation, and the importance of remaining in the ED until their evaluation is complete.   Maxwell Marion, PA-C 12/15/23 1745

## 2023-12-15 NOTE — Discharge Instructions (Signed)
Please use Tylenol for pain.  You may use 1000 mg of Tylenol every 6 hours.  Not to exceed 4 g of Tylenol within 24 hours.  You can use the muscle relaxant I am prescribing in addition to the above to help with any breakthrough pain.  You can take it up to twice daily.  It is safe to take at night, but I would be cautious taking it during the day as it can cause some drowsiness.  Make sure that you are feeling awake and alert before you get behind the wheel of a car or operate a motor vehicle.  It is not a narcotic pain medication so you are able to take it if it is not making you drowsy and still pilot a vehicle or machinery safely.  Please take the entire course of antibiotics that we have prescribed, follow-up with an orthopedic physician if you continue back pain despite treatment.  You can use the rehab exercises I have attached above in addition to the pain medicine that I discussed above.

## 2023-12-15 NOTE — ED Triage Notes (Signed)
Patient to ED with c/o left side flank pain that started this morning no urinary issues no blood noted in urine No n/v/d

## 2023-12-15 NOTE — ED Provider Notes (Signed)
[place] EMERGENCY DEPARTMENT AT [facility] Provider Note   CSN: [id number] Arrival date & time: 12/15/23  1316     History  Chief Complaint  Patient presents with   Flank Pain    Michele Page is a 68 y.o. female with past medical history significant for multiple sclerosis, hypertension, CVA, CKD who presents with concern for right sided flank pain that started this morning.  She denies any dysuria, hematuria, abdominal pain, nausea, vomiting.  She reports that the pain started when she woke up this morning.  She reports no worse with movement, denies any new numbness, tingling.  She denies any new fall, injury.  She does report that she lifts a lot for work.  She denies pain worse after eating.  No previous history of kidney stones but does report CKD, and feels like she has had problems with her right kidney in the past.   Flank Pain       Home Medications Prior to Admission medications   Medication Sig Start Date End Date Taking? Authorizing Provider  cefadroxil (DURICEF) 500 MG capsule Take 1 capsule (500 mg total) by mouth daily. 12/15/23  Yes Daisey Caloca H, PA-C  methocarbamol (ROBAXIN) 500 MG tablet Take 1 tablet (500 mg total) by mouth 2 (two) times daily. 12/15/23  Yes Sascha Baugher H, PA-C  allopurinol (ZYLOPRIM) 100 MG tablet Take 0.5 tablets (50 mg total) by mouth every other day. Patient not taking: Reported on 11/29/2023 04/20/23   Sharon Seller, NP  aspirin EC 81 MG tablet Take 1 tablet (81 mg total) by mouth daily. 03/01/15   Kirt Boys, DO  atorvastatin (LIPITOR) 20 MG tablet Take 1 tablet (20 mg total) by mouth daily. 08/16/23   Sharon Seller, NP  labetalol (NORMODYNE) 300 MG tablet TAKE 2 TABLETS THREE TIMES DAILY 12/09/23   Sharon Seller, NP  letrozole [facility]) 2.5 MG tablet TAKE 1 TABLET EVERY DAY 08/16/23   Serena Croissant, MD  NIFEdipine (PROCARDIA XL/NIFEDICAL XL) 60 MG 24 hr tablet Take 1 tablet (60 mg total) by mouth 2  (two) times daily. 12/14/23   Sharon Seller, NP  palbociclib (IBRANCE) 75 MG tablet Take 1 tablet (75 mg total) by mouth daily. Take for 21 days on, 7 days off, repeat every 28 days. 10/19/23   Serena Croissant, MD  Vibegron (GEMTESA) 75 MG TABS Take 1 tablet (75 mg total) by mouth daily. 07/20/23   Venita Sheffield, MD  zolpidem (AMBIEN) 5 MG tablet Take 1 tablet (5 mg total) by mouth at bedtime as needed for sleep. 11/29/23   Sharon Seller, NP      Allergies    Patient has no known allergies.    Review of Systems   Review of Systems  Genitourinary:  Positive for flank pain.  All other systems reviewed and are negative.   Physical Exam Updated Vital Signs BP (!) 143/88 (BP Location: Left Arm)   Pulse 73   Temp 97.9 F (36.6 C) (Oral)   Resp 18   Ht 5\' 3"  (1.6 m)   Wt 81.3 kg   SpO2 99%   BMI 31.74 kg/m  Physical Exam Vitals and nursing note reviewed.  Constitutional:      General: She is not in acute distress.    Appearance: Normal appearance.  HENT:     Head: Normocephalic and atraumatic.  Eyes:     General:        Right eye: No discharge.  Left eye: No discharge.  Cardiovascular:     Rate and Rhythm: Normal rate and regular rhythm.     Heart sounds: No murmur heard.    No friction rub. No gallop.  Pulmonary:     Effort: Pulmonary effort is normal.     Breath sounds: Normal breath sounds.  Abdominal:     General: Bowel sounds are normal.     Palpations: Abdomen is soft.     Comments: No significant tenderness to palpation of the abdomen, negative CVA tenderness bilaterally.  Musculoskeletal:     Comments: Focal tenderness to palpation in the right lumbar paraspinous muscles, negative straight leg raise on the right, intact strength 5/5 of bilateral lower extremities.  Intact sensation of bilateral lower extremities.  Skin:    General: Skin is warm and dry.     Capillary Refill: Capillary refill takes less than 2 seconds.  Neurological:     Mental  Status: She is alert and oriented to person, place, and time.  Psychiatric:        Mood and Affect: Mood normal.        Behavior: Behavior normal.     ED Results / Procedures / Treatments   Labs (all labs ordered are listed, but only abnormal results are displayed) Labs Reviewed  URINALYSIS, ROUTINE W REFLEX MICROSCOPIC - Abnormal; Notable for the following components:      Result Value   APPearance HAZY (*)    Protein, ur 30 (*)    Leukocytes,Ua LARGE (*)    All other components within normal limits  BASIC METABOLIC PANEL - Abnormal; Notable for the following components:   Glucose, Bld 104 (*)    BUN 39 (*)    Creatinine, Ser 2.26 (*)    GFR, Estimated 23 (*)    All other components within normal limits  CBC - Abnormal; Notable for the following components:   WBC 3.9 (*)    RBC 2.93 (*)    Hemoglobin 10.1 (*)    HCT 30.7 (*)    MCV 104.8 (*)    MCH 34.5 (*)    All other components within normal limits  URINE CULTURE  HEPATIC FUNCTION PANEL    EKG None  Radiology CT ABDOMEN PELVIS WO CONTRAST Result Date: 12/15/2023 CLINICAL DATA:  Abdominal/flank pain, stone suspected Left-sided pain. EXAM: CT ABDOMEN AND PELVIS WITHOUT CONTRAST TECHNIQUE: Multidetector CT imaging of the abdomen and pelvis was performed following the standard protocol without IV contrast. RADIATION DOSE REDUCTION: This exam was performed according to the departmental dose-optimization program which includes automated exposure control, adjustment of the mA and/or kV according to patient size and/or use of iterative reconstruction technique. COMPARISON:  CT 09/24/2023 FINDINGS: Lower chest: Clear lung bases. Hepatobiliary: Unremarkable unenhanced appearance of the liver. Gallbladder physiologically distended, no calcified stone. No biliary dilatation. Pancreas: No ductal dilatation or inflammation. Spleen: Normal in size without focal abnormality. Adrenals/Urinary Tract: Normal adrenal glands. No hydronephrosis  or renal calculi. No evidence of focal renal abnormality. Partially distended urinary bladder, normal for degree of distension. Stomach/Bowel: Minimal hiatal hernia. The stomach is nondistended. No small bowel obstruction or inflammation. The appendix is normal. Multifocal colonic diverticulosis. No diverticulitis or acute colonic inflammation. Vascular/Lymphatic: Aorto bi-iliac atherosclerosis. No enlarged lymph nodes in the abdomen or pelvis. Reproductive: Retroverted uterus.  No adnexal mass. Other: No free air, free fluid, or intra-abdominal fluid collection. Diminutive fat containing umbilical hernia. Musculoskeletal: No acute osseous findings. Stable sclerotic focus in the sacrum. Stable sclerotic focus within  T11. stable L2 lucency. IMPRESSION: 1. No acute abnormality or explanation for abdominal pain. No nephrolithiasis or hydronephrosis. 2. Colonic diverticulosis without diverticulitis. Aortic Atherosclerosis (ICD10-I70.0). Electronically Signed   By: Narda Rutherford M.D.   On: 12/15/2023 20:50    Procedures Procedures    Medications Ordered in ED Medications  methocarbamol (ROBAXIN) tablet 500 mg (500 mg Oral Patient Refused/Not Given 12/15/23 2059)  morphine (PF) 4 MG/ML injection 4 mg (4 mg Intravenous Given 12/15/23 1849)  sodium chloride 0.9 % bolus 1,000 mL (1,000 mLs Intravenous New Bag/Given 12/15/23 1854)  cefTRIAXone (ROCEPHIN) 1 g in sodium chloride 0.9 % 100 mL IVPB (0 g Intravenous Stopped 12/15/23 1921)  HYDROmorphone (DILAUDID) injection 1 mg (1 mg Intravenous Given 12/15/23 2101)    ED Course/ Medical Decision Making/ A&P                                 Medical Decision Making Amount and/or Complexity of Data Reviewed Labs: ordered.   This patient is a 68 y.o. female  who presents to the ED for concern of flank pain, back pain.   Differential diagnoses prior to evaluation: The emergent differential diagnosis includes, but is not limited to,  AAA, renal artery/vein  embolism/thrombosis, mesenteric ischemia, pyelonephritis, nephrolithiasis, cystitis, biliary colic, pancreatitis, perforated peptic ulcer, appendicitis, diverticulitis, bowel obstruction. This is not an exhaustive differential.   Past Medical History / Co-morbidities / Social History: multiple sclerosis, hypertension, CVA, CKD  Physical Exam: Physical exam performed. The pertinent findings include: Some hypertension, blood pressure 143/88, No significant tenderness to palpation of the abdomen, negative CVA tenderness bilaterally.  Focal tenderness to palpation in the right lumbar paraspinous muscles, negative straight leg raise on the right, intact strength 5/5 of bilateral lower extremities.  Intact sensation of bilateral lower extremities.   Lab Tests/Imaging studies: I personally interpreted labs/imaging and the pertinent results include: BMP notable for mild worsening of her chronic kidney disease, creatinine 2.26, BUN 39.  CBC notable for mild anemia, hemoglobin 10.1, mild leukocytopenia, white blood cells 3.9, platelets unremarkable.  Hepatic function panel unremarkable.  UA with large leukocytes, 11-20 white blood cells, no bacteria seen but given her back pain, flank pain suspicious for early UTI..  I independently interpreted CT abdomen pelvis without contrast which shows no evidence of acute intra-abdominal abnormality.  I agree with the radiologist interpretation.   Medications: I ordered medication including Rocephin for urinary tract infection, morphine for pain, fluid bolus for mild AKI.  I have reviewed the patients home medicines and have made adjustments as needed.   Disposition: After consideration of the diagnostic results and the patients response to treatment, I feel that patient symptoms consistent with acute cystitis, lumbar strain, will discharge with Robaxin, cefadroxil, encouraged orthopedic follow-up.  Encourage PCP recheck of her creatinine in 1 to 2 weeks.  Patient  understands and agrees to plan..   emergency department workup does not suggest an emergent condition requiring admission or immediate intervention beyond what has been performed at this time. The plan is: as above. The patient is safe for discharge and has been instructed to return immediately for worsening symptoms, change in symptoms or any other concerns.  Final Clinical Impression(s) / ED Diagnoses Final diagnoses:  Strain of lumbar region, initial encounter  Acute cystitis without hematuria    Rx / DC Orders ED Discharge Orders          Ordered    methocarbamol (  ROBAXIN) 500 MG tablet  2 times daily        12/15/23 2109    cefadroxil (DURICEF) 500 MG capsule  Daily        12/15/23 2109              [place] 12/15/23 2111    Glendora Score, MD 12/16/23 1214

## 2023-12-16 ENCOUNTER — Other Ambulatory Visit: Payer: Self-pay

## 2023-12-16 NOTE — Progress Notes (Signed)
Specialty Pharmacy Refill Coordination Note  Michele Page is a 68 y.o. female contacted today regarding refills of specialty medication(s) Palbociclib [name]   Patient requested Delivery   Delivery date: 12/22/23   Verified address: [address]   Medication will be filled on 12/21/23.

## 2023-12-17 LAB — URINE CULTURE

## 2023-12-21 ENCOUNTER — Other Ambulatory Visit: Payer: Self-pay | Admitting: *Deleted

## 2023-12-21 DIAGNOSIS — Z170 Estrogen receptor positive status [ER+]: Secondary | ICD-10-CM

## 2023-12-21 NOTE — Progress Notes (Signed)
Renewal orders placed for Signatera testing.

## 2023-12-28 ENCOUNTER — Ambulatory Visit: Payer: Medicare HMO | Admitting: Cardiovascular Disease

## 2024-01-13 ENCOUNTER — Other Ambulatory Visit: Payer: Self-pay

## 2024-01-13 NOTE — Progress Notes (Signed)
 Specialty Pharmacy Refill Coordination Note  Michele Page is a 68 y.o. female contacted today regarding refills of specialty medication(s) Palbociclib [name]   Patient requested Delivery   Delivery date: 01/19/24   Verified address: [address]   Medication will be filled on 01/18/24.

## 2024-01-14 ENCOUNTER — Telehealth: Payer: Self-pay

## 2024-01-14 DIAGNOSIS — F5101 Primary insomnia: Secondary | ICD-10-CM

## 2024-01-14 MED ORDER — ZOLPIDEM TARTRATE 5 MG PO TABS: 5.0000 mg | ORAL_TABLET | Freq: Every evening | ORAL | 0 refills | Status: DC | PRN

## 2024-01-14 NOTE — Telephone Encounter (Signed)
Called and left patient a message.

## 2024-01-14 NOTE — Telephone Encounter (Signed)
 Patient called into the office to get a refill on her ambien 5 mg. Patient is suppose to take 1 tablet by mouth at bedtime as needed , but has been taking two tablets due to a recent death of a sibling. Patient would like a refill sent to her pharmacy

## 2024-01-14 NOTE — Telephone Encounter (Signed)
 She is due for a refill but she only be taking 1 at night as needed for sleep- 2 is over the recommend dose.

## 2024-01-19 ENCOUNTER — Telehealth: Payer: Self-pay | Admitting: Hematology and Oncology

## 2024-01-19 NOTE — Telephone Encounter (Signed)
 Scheduled appointment per 3/5 secure chat. Patient is aware of the made appointments.

## 2024-01-20 ENCOUNTER — Encounter: Payer: Self-pay | Admitting: Cardiovascular Disease

## 2024-01-20 ENCOUNTER — Ambulatory Visit: Payer: Medicare HMO | Attending: Cardiovascular Disease | Admitting: Cardiovascular Disease

## 2024-01-20 VITALS — BP 124/74 | HR 74 | Ht 62.0 in | Wt 201.4 lb

## 2024-01-20 DIAGNOSIS — I10 Essential (primary) hypertension: Secondary | ICD-10-CM

## 2024-01-20 DIAGNOSIS — E782 Mixed hyperlipidemia: Secondary | ICD-10-CM

## 2024-01-20 NOTE — Patient Instructions (Signed)
 Medication Instructions:  Your physician recommends that you continue on your current medications as directed. Please refer to the Current Medication list given to you today.  *If you need a refill on your cardiac medications before your next appointment, please call your pharmacy*   Follow-Up: At [facility], you and your health needs are our priority.  As part of our continuing mission to provide you with exceptional heart care, we have created designated Provider Care Teams.  These Care Teams include your primary Cardiologist (physician) and Advanced Practice Providers (APPs -  Physician Assistants and Nurse Practitioners) who all work together to provide you with the care you need, when you need it.  We recommend signing up for the patient portal called "MyChart".  Sign up information is provided on this After Visit Summary.  MyChart is used to connect with patients for Virtual Visits (Telemedicine).  Patients are able to view lab/test results, encounter notes, upcoming appointments, etc.  Non-urgent messages can be sent to your provider as well.   To learn more about what you can do with MyChart, go to [email or web address].    Your next appointment:   We will see you on an as needed basis.  Provider:   Nanetta Batty, MD

## 2024-01-20 NOTE — Assessment & Plan Note (Signed)
 History of essential hypertension with a normal renal duplex study.  Her blood pressure today measures 124/74 and has been stable.  She is on labetalol and nifedipine.

## 2024-01-20 NOTE — Progress Notes (Signed)
 01/20/2024 SHANEIL YAZDI   Oct 14, 1956  [id number]  Primary Physician Sharon Seller, NP Primary Cardiologist: Runell Gess MD Roseanne Reno  HPI:  Michele Page is a 68 y.o.   moderately overweight married African-American female mother of 3 children, grandmother of 12 grandchildren who relocated from [place] to [place] 5 years ago. She is retired from working in her own daycare. I last saw her in the office 01/07/2022.  Is a history of hypertension and hypokalemia. She's had a stroke 4 years ago leaving her with some motor and cognitive deficit deficits. She's never had a heart attack. She denies chest pain or shortness of breath. She was referred here for further management of her hypertension. Since I saw her a year ago her blood pressure has remained well controlled on her current medical regimen.   I obtained renal Doppler studies 08/17/2020 that showed no evidence of renal artery stenosis.  I referred her to our Pharm.D. who has been working with her antihypertensive medications.     Since I saw her in the office 2 years ago she continues to do well.  Her blood pressures are under much better control.  Her lipid profile is excellent.  She denies chest pain or shortness of breath.    Current Meds  Medication Sig   aspirin EC 81 MG tablet Take 1 tablet (81 mg total) by mouth daily.   atorvastatin (LIPITOR) 20 MG tablet Take 1 tablet (20 mg total) by mouth daily.   labetalol (NORMODYNE) 300 MG tablet TAKE 2 TABLETS THREE TIMES DAILY   letrozole (FEMARA) 2.5 MG tablet TAKE 1 TABLET EVERY DAY   NIFEdipine (PROCARDIA XL/NIFEDICAL XL) 60 MG 24 hr tablet Take 1 tablet (60 mg total) by mouth 2 (two) times daily.   palbociclib (IBRANCE) 75 MG tablet Take 1 tablet (75 mg total) by mouth daily. Take for 21 days on, 7 days off, repeat every 28 days.   Vibegron (GEMTESA) 75 MG TABS Take 1 tablet (75 mg total) by mouth daily.   zolpidem (AMBIEN) 5 MG tablet Take 1 tablet  (5 mg total) by mouth at bedtime as needed for sleep.     No Known Allergies  Social History   Socioeconomic History   Marital status: Married    Spouse name: Not on file   Number of children: 3   Years of education: Not on file   Highest education level: Associate degree: academic program  Occupational History   Occupation: retired  Tobacco Use   Smoking status: Former    Current packs/day: 0.00    Average packs/day: 0.5 packs/day for 25.0 years (12.5 ttl pk-yrs)    Types: Cigarettes    Start date: 11/17/1979    Quit date: 11/16/2004    Years since quitting: 19.1   Smokeless tobacco: Never  Vaping Use   Vaping status: Never Used  Substance and Sexual Activity   Alcohol use: Yes    Comment: occas   Drug use: No   Sexual activity: Yes    Comment: 1st intercourse 14 yo-5 partners  Other Topics Concern   Not on file  Social History Narrative   Diet- N/A   Caffeine- Yes   Married- Yes   House- 2 story with 2 people   Pets- No   Current/past profession- Research officer, 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   Exercise- Yes   Living will-No   DNR-N/A   POA/HPOA-No      Works part-time at [employer]   Social Drivers of Health   Financial Resource Strain: Low Risk  (04/15/2023)   Overall Financial Resource Strain (CARDIA)    Difficulty of Paying Living Expenses: Not hard at all  Food Insecurity: No Food Insecurity (10/07/2023)   Hunger Vital Sign    Worried About Running Out of Food in the Last Year: Never true    Ran Out of Food in the Last Year: Never true  Transportation Needs: No Transportation Needs (10/07/2023)   PRAPARE - Administrator, Civil Service (Medical): No    Lack of Transportation (Non-Medical): No  Physical Activity: Sufficiently Active (10/07/2023)   Exercise Vital Sign    Days of Exercise per Week: 5 days    Minutes of Exercise per Session: 30 min  Stress: No Stress Concern Present (10/07/2023)   [employer] of Occupational Health - Occupational Stress  Questionnaire    Feeling of Stress : Not at all  Social Connections: Socially Integrated (10/07/2023)   Social Connection and Isolation Panel [NHANES]    Frequency of Communication with Friends and Family: Twice a week    Frequency of Social Gatherings with Friends and Family: Once a week    Attends Religious Services: 1 to 4 times per year    Active Member of [employer] or Organizations: Yes    Attends Banker Meetings: 1 to 4 times per year    Marital Status: Married  Catering manager Violence: Not At Risk (11/24/2017)   Humiliation, Afraid, Rape, and Kick questionnaire    Fear of Current or Ex-Partner: No    Emotionally Abused: No    Physically Abused: No    Sexually Abused: No     Review of Systems: General: negative for chills, fever, night sweats or weight changes.  Cardiovascular: negative for chest pain, dyspnea on exertion, edema, orthopnea, palpitations, paroxysmal nocturnal dyspnea or shortness of breath Dermatological: negative for rash Respiratory: negative for cough or wheezing Urologic: negative for hematuria Abdominal: negative for nausea, vomiting, diarrhea, bright red blood per rectum, melena, or hematemesis Neurologic: negative for visual changes, syncope, or dizziness All other systems reviewed and are otherwise negative except as noted above.    Blood pressure 124/74, pulse 74, height 5\' 2"  (1.575 m), weight 201 lb 6.4 oz (91.4 kg), SpO2 92%.  General appearance: alert and no distress Neck: no adenopathy, no carotid bruit, no JVD, supple, symmetrical, trachea midline, and thyroid not enlarged, symmetric, no tenderness/mass/nodules Lungs: clear to auscultation bilaterally Heart: regular rate and rhythm, S1, S2 normal, no murmur, click, rub or gallop Extremities: extremities normal, atraumatic, no cyanosis or edema Pulses: 2+ and symmetric Skin: Skin color, texture, turgor normal. No rashes or lesions Neurologic: Grossly normal  EKG EKG  Interpretation Date/Time:  Thursday January 20 2024 09:03:57 EST Ventricular Rate:  74 PR Interval:  154 QRS Duration:  74 QT Interval:  410 QTC Calculation: 455 R Axis:   -5  Text Interpretation: Normal sinus rhythm Minimal voltage criteria for LVH, may be normal variant ( R in aVL ) Cannot rule out Anterior infarct , age undetermined When compared with ECG of 03-May-2023 10:10, PREVIOUS ECG IS PRESENT Confirmed by Nanetta Batty [phone]) on 01/20/2024 9:44:07 AM    ASSESSMENT AND PLAN:   Essential hypertension History of essential hypertension with a normal renal duplex study.  Her blood pressure today measures 124/74 and has been stable.  She is on labetalol and nifedipine.  Hyperlipidemia History of hyperlipidemia on atorvastatin with lipid profile performed 04/19/2023  revealing total cholesterol of 150, LDL 57 and HDL of 75.     Runell Gess MD FACP,FACC,FAHA, [facility] 01/20/2024 9:48 AM

## 2024-01-20 NOTE — Assessment & Plan Note (Signed)
 History of hyperlipidemia on atorvastatin with lipid profile performed 04/19/2023 revealing total cholesterol of 150, LDL 57 and HDL of 75.

## 2024-01-22 ENCOUNTER — Other Ambulatory Visit: Payer: Self-pay | Admitting: Sports Medicine

## 2024-01-22 DIAGNOSIS — N3281 Overactive bladder: Secondary | ICD-10-CM

## 2024-01-27 ENCOUNTER — Ambulatory Visit: Payer: Medicare HMO | Admitting: Cardiovascular Disease

## 2024-02-03 ENCOUNTER — Inpatient Hospital Stay: Admitting: Hematology and Oncology

## 2024-02-03 ENCOUNTER — Inpatient Hospital Stay: Attending: Hematology and Oncology

## 2024-02-03 DIAGNOSIS — Z79811 Long term (current) use of aromatase inhibitors: Secondary | ICD-10-CM | POA: Insufficient documentation

## 2024-02-03 DIAGNOSIS — Z170 Estrogen receptor positive status [ER+]: Secondary | ICD-10-CM | POA: Insufficient documentation

## 2024-02-03 DIAGNOSIS — D72819 Decreased white blood cell count, unspecified: Secondary | ICD-10-CM | POA: Insufficient documentation

## 2024-02-03 DIAGNOSIS — C50412 Malignant neoplasm of upper-outer quadrant of left female breast: Secondary | ICD-10-CM | POA: Diagnosis not present

## 2024-02-03 DIAGNOSIS — C7951 Secondary malignant neoplasm of bone: Secondary | ICD-10-CM | POA: Diagnosis not present

## 2024-02-03 DIAGNOSIS — D649 Anemia, unspecified: Secondary | ICD-10-CM | POA: Insufficient documentation

## 2024-02-03 LAB — CBC WITH DIFFERENTIAL (CANCER CENTER ONLY)
Abs Immature Granulocytes: 0.01 10*3/uL (ref 0.00–0.07)
Basophils Absolute: 0.1 10*3/uL (ref 0.0–0.1)
Basophils Relative: 2 %
Eosinophils Absolute: 0.1 10*3/uL (ref 0.0–0.5)
Eosinophils Relative: 2 %
HCT: 31 % — ABNORMAL LOW (ref 36.0–46.0)
Hemoglobin: 10.6 g/dL — ABNORMAL LOW (ref 12.0–15.0)
Immature Granulocytes: 0 %
Lymphocytes Relative: 21 %
Lymphs Abs: 0.7 10*3/uL (ref 0.7–4.0)
MCH: 34.6 pg — ABNORMAL HIGH (ref 26.0–34.0)
MCHC: 34.2 g/dL (ref 30.0–36.0)
MCV: 101.3 fL — ABNORMAL HIGH (ref 80.0–100.0)
Monocytes Absolute: 0.1 10*3/uL (ref 0.1–1.0)
Monocytes Relative: 4 %
Neutro Abs: 2.3 10*3/uL (ref 1.7–7.7)
Neutrophils Relative %: 71 %
Platelet Count: 394 10*3/uL (ref 150–400)
RBC: 3.06 MIL/uL — ABNORMAL LOW (ref 3.87–5.11)
RDW: 13 % (ref 11.5–15.5)
WBC Count: 3.2 10*3/uL — ABNORMAL LOW (ref 4.0–10.5)
nRBC: 0 % (ref 0.0–0.2)

## 2024-02-03 LAB — CMP (CANCER CENTER ONLY)
ALT: 12 U/L (ref 0–44)
AST: 20 U/L (ref 15–41)
Albumin: 4.7 g/dL (ref 3.5–5.0)
Alkaline Phosphatase: 102 U/L (ref 38–126)
Anion gap: 10 (ref 5–15)
BUN: 23 mg/dL (ref 8–23)
CO2: 26 mmol/L (ref 22–32)
Calcium: 9.7 mg/dL (ref 8.9–10.3)
Chloride: 107 mmol/L (ref 98–111)
Creatinine: 2.21 mg/dL — ABNORMAL HIGH (ref 0.44–1.00)
GFR, Estimated: 24 mL/min — ABNORMAL LOW (ref >60)
Glucose, Bld: 122 mg/dL — ABNORMAL HIGH (ref 70–99)
Potassium: 4 mmol/L (ref 3.5–5.1)
Sodium: 143 mmol/L (ref 135–145)
Total Bilirubin: 0.5 mg/dL (ref 0.0–1.2)
Total Protein: 7.6 g/dL (ref 6.5–8.1)

## 2024-02-03 NOTE — Assessment & Plan Note (Signed)
 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 systemic chemotherapy with CMF x6 cycles completed 01/30/2022 2. adjuvant radiation therapy completing 04/09/2022 3.  For the continued antiestrogen therapy (previously anastrozole started 07/23/2016 switched to letrozole 2018 switched to tamoxifen 2018, switched back to anastrozole) hair loss was the main problem, may consider exemestane ------------------------------------------------------------------------------------------------------------------------------- Current treatment: Letrozole with Ibrance.  Patient could not tolerate Verzinio Signatera:  06/12/2022: Positive (0.52) 09/02/2022: Positive (68.11) Mar 22, 2023: Positive (106.54) 07/06/2023: Positive (662) 09/13/2023: Positive (57.14)   CT CAP 07/10/2022: No metastatic disease.  Tiny pulmonary nodules nonspecific. CT CAP 04/02/2023: New peripherally sclerotic lesion superior endplate L2 suspicious for treated metastasis or Schmorl's node.  No other sites of metastatic disease identified CT CAP 07/06/2023: Subpectoral lymph node 7 mm (used to be 5 to 6 mm) ill-defined groundglass density right lower lobe bone metastases stable T11 L2 CT CAP 09/24/2023: Stable bone metastasis T11 L2 MRI neck: 10/26/2023: Left thyroid nodule 2.5 cm unchanged, no findings in the neck 10/27/2023: Left leg x-rays: Mild  osteoarthritis 12/15/2023: CT abdomen: Stable T11 and L2   I discussed with the patient apart from the T11 and L2 vertebral bodies she has pectoral lymph node as the only evidence of metastatic disease.

## 2024-02-03 NOTE — Progress Notes (Signed)
 Patient Care Team: Sharon Seller, NP as PCP - General (Geriatric Medicine) Runell Gess, MD as PCP - Cardiology (Cardiology) Serena Croissant, MD as Consulting Physician (Oncology) Harriette Bouillon, MD as Consulting Physician (General Surgery) Anselm Lis, RPH-CPP as Pharmacist (Hematology and Oncology) Rhea Belton, Carie Caddy, MD as Consulting Physician (Gastroenterology)  DIAGNOSIS:  Encounter Diagnosis  Name Primary?   Malignant neoplasm of upper-outer quadrant of left breast in female, estrogen receptor positive ([name]) Yes    SUMMARY OF ONCOLOGIC HISTORY: Oncology History  Breast cancer of 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)  03/31/2016 Initial Diagnosis   Screening detected left breast mass, 2 nodules, 1.9 x 1.6 x 0.8 cm= fat necrosis; 8 x 7 x 7 mm= grade 2 IDC ER 100%, PR 100%, HER-2 negative ratio 1.39, Ki-67 20%   04/17/2016 Surgery   Left lumpectomy ([name]): IDC grade 2, 1.3 cm, with DCIS, margins negative, 0/4 lymph nodes negative, T1 cN0 stage IA pathologic stage, ER 100%, PR 100%, HER-2 negative ratio 1.39, Ki-67 20% Oncotype DX score 20, 13% ROR, intermediate risk   04/17/2016 Oncotype testing   Recurrence score: 20; ROR 15% (intermediate risk)    05/27/2016 - 07/13/2016 Radiation Therapy   Adjuvant radiation therapy [facility]): Left breast treated with breath hold to 50.4 Gy in 28 fractions at 1.8 Gy/fraction.  Left breast boosted to 10 Gy in 5 fractions at 2 Gy/fraction   07/23/2016 -  Anti-estrogen oral therapy   Anastrozole 1 mg switched to letrozole 04/22/2017 due to hair loss, switch to tamoxifen 10/22/2017 due to hair loss from letrozole as well   Malignant neoplasm of upper-outer quadrant of right breast in female, estrogen receptor positive ([name])  08/04/2021 Initial Diagnosis   Malignant neoplasm of upper-outer quadrant of right breast in female, estrogen receptor positive ([name])    Relapse/Recurrence   2.2 cm ILC ER 100%, PR 90%, Her 2 Neg, Ki 67: 25%,  Size 2.5 cm   08/04/2021 Cancer Staging   Staging form: Breast, AJCC 8th Edition - Clinical: Stage IB (cT2, cN0, cM0, G2, ER+, PR+, HER2-) - Signed by Serena Croissant, MD on 08/04/2021 Stage prefix: Initial diagnosis Histologic grading system: 3 grade system   09/02/2021 Surgery   Right lumpectomy: Grade 2 ILC 3.1 cm, margins negative, LVI present, 4/6 lymph nodes macrometastases, 2 lymph nodes isolated tumor cells, ER 100%, PR 90%, Ki-67 25%, HER2 negative   10/17/2021 - 01/30/2022 Chemotherapy   Patient is on Treatment Plan : BREAST Adjuvant CMF IV q21d      Genetic Testing   Negative genetic testing. No pathogenic variants identified on the [employer] CustomNext+RNA panel. The report date is 03/04/2022.  The CustomNext-Cancer+RNAinsight panel offered by [employer] includes sequencing and rearrangement analysis for the following 47 genes:  APC, ATM, AXIN2, BARD1, BMPR1A, BRCA1, BRCA2, BRIP1, CDH1, CDK4, CDKN2A, CHEK2, DICER1, EPCAM, GREM1, HOXB13, MEN1, MLH1, MSH2, MSH3, MSH6, MUTYH, NBN, NF1, NF2, NTHL1, PALB2, PMS2, POLD1, POLE, PTEN, RAD51C, RAD51D, RECQL, RET, SDHA, SDHAF2, SDHB, SDHC, SDHD, SMAD4, SMARCA4, STK11, TP53, TSC1, TSC2, and VHL.  RNA data is routinely analyzed for use in variant interpretation for all genes.     CHIEF COMPLIANT: Follow-up on Ibrance and letrozole  HISTORY OF PRESENT ILLNESS:   History of Present Illness Michele Page is a 68 year old female with breast cancer who presents with new breast pain and swelling.  She has experienced new, intermittent pain and swelling in both breasts for one month, with one breast more swollen than  the other. The sensation is different from previous experiences. She is on letrozole and Verzenio for breast cancer treatment. Her last mammogram in December was normal, and a scan six weeks ago was mostly fine. She is due for another scan in early May. Recent blood work shows a white blood cell count of 3.2, hemoglobin at 10.6, and  creatinine at 2.2, consistent with previous results. She has a history of stable lesions at T11 and L2 and is scheduled to see her nephrologist next week.     ALLERGIES:  has no known allergies.  MEDICATIONS:  Current Outpatient Medications  Medication Sig Dispense Refill   atorvastatin (LIPITOR) 20 MG tablet Take 1 tablet (20 mg total) by mouth daily. 90 tablet 3   GEMTESA 75 MG TABS Take 1 tablet by mouth once daily 90 tablet 0   labetalol (NORMODYNE) 300 MG tablet TAKE 2 TABLETS THREE TIMES DAILY 540 tablet 3   letrozole (FEMARA) 2.5 MG tablet TAKE 1 TABLET EVERY DAY 90 tablet 3   NIFEdipine (PROCARDIA XL/NIFEDICAL XL) 60 MG 24 hr tablet Take 1 tablet (60 mg total) by mouth 2 (two) times daily. 60 tablet 0   palbociclib (IBRANCE) 75 MG tablet Take 1 tablet (75 mg total) by mouth daily. Take for 21 days on, 7 days off, repeat every 28 days. 21 tablet 3   zolpidem (AMBIEN) 5 MG tablet Take 1 tablet (5 mg total) by mouth at bedtime as needed for sleep. 30 tablet 0   aspirin EC 81 MG tablet Take 1 tablet (81 mg total) by mouth daily. (Patient not taking: Reported on 02/03/2024) 30 tablet 6   No current facility-administered medications for this visit.    PHYSICAL EXAMINATION: ECOG PERFORMANCE STATUS: 1 - Symptomatic but completely ambulatory  There were no vitals filed for this visit. There were no vitals filed for this visit.  Physical Exam BREAST: Breasts normal on examination.  (exam performed in the presence of a chaperone)  LABORATORY DATA:  I have reviewed the data as listed    Latest Ref Rng & Units 02/03/2024    8:44 AM 12/15/2023    5:01 PM 11/29/2023    1:35 PM  CMP  Glucose 70 - 99 mg/dL 962  952  89   BUN 8 - 23 mg/dL 23  39  27   Creatinine 0.44 - 1.00 mg/dL 8.41  3.24  4.01   Sodium 135 - 145 mmol/L 143  136  140   Potassium 3.5 - 5.1 mmol/L 4.0  3.5  3.9   Chloride 98 - 111 mmol/L 107  103  106   CO2 22 - 32 mmol/L 26  22  24    Calcium 8.9 - 10.3 mg/dL 9.7  9.1   9.6   Total Protein 6.5 - 8.1 g/dL 7.6  7.2  7.0   Total Bilirubin 0.0 - 1.2 mg/dL 0.5  0.5  0.3   Alkaline Phos 38 - 126 U/L 102  89    AST 15 - 41 U/L 20  22  15    ALT 0 - 44 U/L 12  16  11      Lab Results  Component Value Date   WBC 3.2 (L) 02/03/2024   HGB 10.6 (L) 02/03/2024   HCT 31.0 (L) 02/03/2024   MCV 101.3 (H) 02/03/2024   PLT 394 02/03/2024   NEUTROABS 2.3 02/03/2024    ASSESSMENT & PLAN:  Breast cancer of 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) 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: IDC grade  2, 1.3 cm, with DCIS, margins negative, 0/4 lymph nodes negative, T1 cN0 stage IA pathologic stage, ER 100%, PR 100%, HER-2 negative ratio 1.39, Ki-67 20% (Originally 2 nodules were detected on screening mammogram 1.9 cm= fat necrosis and 8 mm IDC) Oncotype DX score 20, 13% risk of recurrence, intermediate risk Adjuvant radiation therapy started 07/20/2017completed 07/13/2016     09/02/2021:Right lumpectomy: Grade 2 ILC 3.1 cm, margins negative, LVI present, 4/6 lymph nodes macrometastases, 2 lymph nodes isolated tumor cells, ER 100%, PR 90%, Ki-67 25%, HER2 negative   Treatment plan/summary: 1. systemic chemotherapy with CMF x6 cycles completed 01/30/2022 2. adjuvant radiation therapy completing 04/09/2022 3.  For the continued antiestrogen therapy (previously anastrozole started 07/23/2016 switched to letrozole 2018 switched to tamoxifen 2018, switched back to anastrozole) hair loss was the main problem, may consider exemestane ------------------------------------------------------------------------------------------------------------------------------- Current treatment: Letrozole with Ibrance.  Patient could not tolerate Verzinio Signatera:  06/12/2022: Positive (0.52) 09/02/2022: Positive (68.11) Mar 22, 2023: Positive (106.54) 07/06/2023: Positive (662) 09/13/2023: Positive (57.14)   CT CAP 07/10/2022: No metastatic disease.  Tiny pulmonary nodules nonspecific. CT CAP  04/02/2023: New peripherally sclerotic lesion superior endplate L2 suspicious for treated metastasis or Schmorl's node.  No other sites of metastatic disease identified CT CAP 07/06/2023: Subpectoral lymph node 7 mm (used to be 5 to 6 mm) ill-defined groundglass density right lower lobe bone metastases stable T11 L2 CT CAP 09/24/2023: Stable bone metastasis T11 L2 MRI neck: 10/26/2023: Left thyroid nodule 2.5 cm unchanged, no findings in the neck 10/27/2023: Left leg x-rays: Mild osteoarthritis 12/15/2023: CT abdomen: Stable T11 and L2   I discussed with the patient apart from the T11 and L2 vertebral bodies she has pectoral lymph node as the only evidence of metastatic disease. ------------------------------------- Assessment and Plan Assessment & Plan Metastatic breast cancer Metastatic breast cancer with stable lesions at T11 and L2. Recent imaging showed no new lesions. Disease well-managed on letrozole and palbociclib. - Continue letrozole and palbociclib. - Order CT scan in the first week of May to monitor for disease progression.  Breast pain and swelling New bilateral breast pain and swelling, particularly in upper-outer quadrants. Last mammogram in December was normal. - Order mammogram at the breast center for further evaluation.  Chronic kidney disease Chronic kidney disease with stable creatinine level of 2.2. Managed by nephrologist.  Anemia Mild anemia with hemoglobin at 10.6, showing improvement.  Leukopenia Leukopenia with stable white blood cell count at 3.2.  Follow-up - Schedule follow-up appointment approximately one week after the CT scan in May.      Orders Placed This Encounter  Procedures   MM DIAG BREAST TOMO BILATERAL    Standing Status:   Future    Expected Date:   02/17/2024    Expiration Date:   02/02/2025    Reason for Exam (SYMPTOM  OR DIAGNOSIS REQUIRED):   Bilateral breast pains    [facility]    Release to  patient:   Immediate   CT CHEST ABDOMEN PELVIS WO CONTRAST    Standing Status:   Future    Expected Date:   03/21/2024    Expiration Date:   02/02/2025    Preferred imaging location?:   [facility]    If indicated for the ordered procedure, I authorize the administration of oral contrast media per Radiology protocol:   Yes    Does the patient have a contrast media/X-ray dye allergy?:   Yes    Release to  patient:   Immediate   CBC with Differential (Cancer Center Only)    Standing Status:   Future    Expiration Date:   02/02/2025   CMP (Cancer Center only)    Standing Status:   Future    Expiration Date:   02/02/2025   CA 27.29    Standing Status:   Future    Expiration Date:   02/02/2025   The patient has a good understanding of the overall plan. she agrees with it. she will call with any problems that may develop before the next visit here. Total time spent: 30 mins including face to face time and time spent for planning, charting and co-ordination of care   Tamsen Meek, MD 02/03/24

## 2024-02-04 ENCOUNTER — Telehealth: Payer: Self-pay | Admitting: Hematology and Oncology

## 2024-02-04 NOTE — Telephone Encounter (Signed)
 Scheduled appointments per 3/20 los. Talked with the patient and she is aware of the made appointments.

## 2024-02-08 ENCOUNTER — Other Ambulatory Visit: Payer: Self-pay | Admitting: Hematology and Oncology

## 2024-02-08 DIAGNOSIS — Z170 Estrogen receptor positive status [ER+]: Secondary | ICD-10-CM

## 2024-02-08 DIAGNOSIS — N644 Mastodynia: Secondary | ICD-10-CM

## 2024-02-10 DIAGNOSIS — N184 Chronic kidney disease, stage 4 (severe): Secondary | ICD-10-CM | POA: Diagnosis not present

## 2024-02-10 DIAGNOSIS — C50919 Malignant neoplasm of unspecified site of unspecified female breast: Secondary | ICD-10-CM | POA: Diagnosis not present

## 2024-02-10 DIAGNOSIS — M109 Gout, unspecified: Secondary | ICD-10-CM | POA: Diagnosis not present

## 2024-02-10 DIAGNOSIS — E785 Hyperlipidemia, unspecified: Secondary | ICD-10-CM | POA: Diagnosis not present

## 2024-02-10 DIAGNOSIS — I129 Hypertensive chronic kidney disease with stage 1 through stage 4 chronic kidney disease, or unspecified chronic kidney disease: Secondary | ICD-10-CM | POA: Diagnosis not present

## 2024-02-10 DIAGNOSIS — N3281 Overactive bladder: Secondary | ICD-10-CM | POA: Diagnosis not present

## 2024-02-14 ENCOUNTER — Other Ambulatory Visit: Payer: Self-pay | Admitting: Pharmacy Technician

## 2024-02-14 ENCOUNTER — Other Ambulatory Visit: Payer: Self-pay | Admitting: Hematology and Oncology

## 2024-02-14 ENCOUNTER — Other Ambulatory Visit: Payer: Self-pay

## 2024-02-14 NOTE — Progress Notes (Signed)
 Specialty Pharmacy Refill Coordination Note  Michele Page is a 68 y.o. female contacted today regarding refills of specialty medication(s) Palbociclib [name]   Patient requested Delivery   Delivery date: 02/17/24   Verified address: [address]  [place]   Medication will be filled on 02/16/24.  RR sent to MD

## 2024-02-15 ENCOUNTER — Other Ambulatory Visit: Payer: Self-pay

## 2024-02-15 MED ORDER — PALBOCICLIB 75 MG PO TABS
75.0000 mg | ORAL_TABLET | Freq: Every day | ORAL | 3 refills | Status: DC
  Filled 2024-02-15: qty 21, 28d supply, fill #0
  Filled 2024-03-07: qty 21, 28d supply, fill #1
  Filled 2024-04-07: qty 21, 28d supply, fill #2
  Filled 2024-05-09: qty 21, 28d supply, fill #3

## 2024-02-23 ENCOUNTER — Ambulatory Visit
Admission: RE | Admit: 2024-02-23 | Discharge: 2024-02-23 | Disposition: A | Discharge status: Home / Self Care | Source: Ambulatory Visit | Attending: Hematology and Oncology | Admitting: Hematology and Oncology

## 2024-02-23 ENCOUNTER — Ambulatory Visit

## 2024-02-23 ENCOUNTER — Ambulatory Visit: Admission: RE | Admit: 2024-02-23 | Source: Ambulatory Visit

## 2024-02-23 DIAGNOSIS — C50412 Malignant neoplasm of upper-outer quadrant of left female breast: Secondary | ICD-10-CM

## 2024-02-23 DIAGNOSIS — N644 Mastodynia: Secondary | ICD-10-CM | POA: Diagnosis not present

## 2024-03-07 ENCOUNTER — Other Ambulatory Visit: Payer: Self-pay

## 2024-03-07 DIAGNOSIS — C50412 Malignant neoplasm of upper-outer quadrant of left female breast: Secondary | ICD-10-CM | POA: Diagnosis not present

## 2024-03-07 DIAGNOSIS — Z170 Estrogen receptor positive status [ER+]: Secondary | ICD-10-CM | POA: Diagnosis not present

## 2024-03-07 NOTE — Progress Notes (Signed)
 Specialty Pharmacy Ongoing Clinical Assessment Note  Michele Page is a 68 y.o. female who is being followed by the specialty pharmacy service for RxSp Oncology   Patient's specialty medication(s) reviewed today: Palbociclib  (IBRANCE )   Missed doses in the last 4 weeks: 2   Patient/Caregiver did not have any additional questions or concerns.   Therapeutic benefit summary: Patient is achieving benefit   Adverse events/side effects summary: No adverse events/side effects   Patient's therapy is appropriate to: Continue    Goals Addressed             This Visit's Progress    Slow Disease Progression       Patient is on track. Patient will maintain adherence and adhere to provider and/or lab appointments. Per provider notes from 02/03/24, disease remains stable.          Follow up:  6 months  [name] M Chatara Lucente Specialty Pharmacist

## 2024-03-07 NOTE — Progress Notes (Signed)
 Specialty Pharmacy Refill Coordination Note  Michele Page is a 68 y.o. female contacted today regarding refills of specialty medication(s) Palbociclib  (IBRANCE )   Patient requested Delivery   Delivery date: 03/16/24   Verified address: [address]  [place]   Medication will be filled on 03/15/24.

## 2024-03-08 ENCOUNTER — Other Ambulatory Visit: Payer: Self-pay

## 2024-03-14 LAB — SIGNATERA ONLY (NATERA MANAGED)
SIGNATERA MTM READOUT: 109.06 MTM/ml — AB
SIGNATERA TEST RESULT: POSITIVE — AB

## 2024-03-15 ENCOUNTER — Other Ambulatory Visit: Payer: Self-pay

## 2024-03-21 ENCOUNTER — Ambulatory Visit (HOSPITAL_COMMUNITY)
Admission: RE | Admit: 2024-03-21 | Discharge: 2024-03-21 | Disposition: A | Discharge status: Home / Self Care | Source: Ambulatory Visit | Attending: Hematology and Oncology | Admitting: Hematology and Oncology

## 2024-03-21 DIAGNOSIS — C50412 Malignant neoplasm of upper-outer quadrant of left female breast: Secondary | ICD-10-CM | POA: Insufficient documentation

## 2024-03-21 DIAGNOSIS — K5730 Diverticulosis of large intestine without perforation or abscess without bleeding: Secondary | ICD-10-CM | POA: Diagnosis not present

## 2024-03-21 DIAGNOSIS — Z170 Estrogen receptor positive status [ER+]: Secondary | ICD-10-CM | POA: Insufficient documentation

## 2024-03-21 DIAGNOSIS — C7951 Secondary malignant neoplasm of bone: Secondary | ICD-10-CM | POA: Diagnosis not present

## 2024-03-21 DIAGNOSIS — J432 Centrilobular emphysema: Secondary | ICD-10-CM | POA: Diagnosis not present

## 2024-04-03 ENCOUNTER — Other Ambulatory Visit: Payer: Self-pay

## 2024-04-04 NOTE — Assessment & Plan Note (Signed)
 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 systemic chemotherapy with CMF x6 cycles completed 01/30/2022 2. adjuvant radiation therapy completing 04/09/2022 3.  For the continued antiestrogen therapy (previously anastrozole  started 07/23/2016 switched to letrozole  2018 switched to tamoxifen  2018, switched back to anastrozole ) hair loss was the main problem, may consider exemestane --------------------------------------------------------------------------------------  Current treatment: Letrozole  with Ibrance .  Patient could not tolerate Verzinio Signatera:  06/12/2022: Positive (0.52) 09/02/2022: Positive (68.11) Mar 22, 2023: Positive (106.54) 07/06/2023: Positive (662) 09/13/2023: Positive (57.14)   CT CAP 07/10/2022: No metastatic disease.  Tiny pulmonary nodules nonspecific. CT CAP 04/02/2023: New peripherally sclerotic lesion superior endplate L2 suspicious for treated metastasis or Schmorl's node.  No other sites of metastatic disease identified CT CAP 07/06/2023: Subpectoral lymph node 7 mm (used to be 5 to 6 mm) ill-defined groundglass density right lower lobe bone metastases stable T11 L2 CT CAP 09/24/2023: Stable bone metastasis T11 L2 MRI neck: 10/26/2023: Left thyroid  nodule 2.5 cm unchanged, no findings in the neck 10/27/2023: Left leg x-rays: Mild osteoarthritis 12/15/2023: CT abdomen: Stable T11 and  L2 03/23/24: CT CAP: Stable T 11 and L2   RTC in 3 months

## 2024-04-05 ENCOUNTER — Inpatient Hospital Stay

## 2024-04-05 ENCOUNTER — Inpatient Hospital Stay: Attending: Hematology and Oncology | Admitting: Hematology and Oncology

## 2024-04-05 ENCOUNTER — Other Ambulatory Visit: Payer: Self-pay

## 2024-04-05 VITALS — BP 127/68 | HR 82 | Temp 97.7°F | Resp 16 | Ht 62.0 in | Wt 196.2 lb

## 2024-04-05 DIAGNOSIS — D72819 Decreased white blood cell count, unspecified: Secondary | ICD-10-CM | POA: Diagnosis not present

## 2024-04-05 DIAGNOSIS — Z170 Estrogen receptor positive status [ER+]: Secondary | ICD-10-CM | POA: Diagnosis not present

## 2024-04-05 DIAGNOSIS — C50412 Malignant neoplasm of upper-outer quadrant of left female breast: Secondary | ICD-10-CM

## 2024-04-05 DIAGNOSIS — C50411 Malignant neoplasm of upper-outer quadrant of right female breast: Secondary | ICD-10-CM | POA: Diagnosis not present

## 2024-04-05 DIAGNOSIS — Z1732 Human epidermal growth factor receptor 2 negative status: Secondary | ICD-10-CM | POA: Diagnosis not present

## 2024-04-05 DIAGNOSIS — N189 Chronic kidney disease, unspecified: Secondary | ICD-10-CM | POA: Diagnosis not present

## 2024-04-05 DIAGNOSIS — C7951 Secondary malignant neoplasm of bone: Secondary | ICD-10-CM | POA: Diagnosis not present

## 2024-04-05 DIAGNOSIS — Z79811 Long term (current) use of aromatase inhibitors: Secondary | ICD-10-CM | POA: Diagnosis not present

## 2024-04-05 DIAGNOSIS — D649 Anemia, unspecified: Secondary | ICD-10-CM | POA: Insufficient documentation

## 2024-04-05 LAB — CBC WITH DIFFERENTIAL (CANCER CENTER ONLY)
Abs Immature Granulocytes: 0 10*3/uL (ref 0.00–0.07)
Basophils Absolute: 0.1 10*3/uL (ref 0.0–0.1)
Basophils Relative: 3 %
Eosinophils Absolute: 0 10*3/uL (ref 0.0–0.5)
Eosinophils Relative: 1 %
HCT: 28.2 % — ABNORMAL LOW (ref 36.0–46.0)
Hemoglobin: 9.9 g/dL — ABNORMAL LOW (ref 12.0–15.0)
Immature Granulocytes: 0 %
Lymphocytes Relative: 22 %
Lymphs Abs: 0.6 10*3/uL — ABNORMAL LOW (ref 0.7–4.0)
MCH: 33.8 pg (ref 26.0–34.0)
MCHC: 35.1 g/dL (ref 30.0–36.0)
MCV: 96.2 fL (ref 80.0–100.0)
Monocytes Absolute: 0.2 10*3/uL (ref 0.1–1.0)
Monocytes Relative: 7 %
Neutro Abs: 1.9 10*3/uL (ref 1.7–7.7)
Neutrophils Relative %: 67 %
Platelet Count: 315 10*3/uL (ref 150–400)
RBC: 2.93 MIL/uL — ABNORMAL LOW (ref 3.87–5.11)
RDW: 13.6 % (ref 11.5–15.5)
WBC Count: 2.9 10*3/uL — ABNORMAL LOW (ref 4.0–10.5)
nRBC: 0 % (ref 0.0–0.2)

## 2024-04-05 LAB — CMP (CANCER CENTER ONLY)
ALT: 10 U/L (ref 0–44)
AST: 17 U/L (ref 15–41)
Albumin: 4.7 g/dL (ref 3.5–5.0)
Alkaline Phosphatase: 105 U/L (ref 38–126)
Anion gap: 10 (ref 5–15)
BUN: 31 mg/dL — ABNORMAL HIGH (ref 8–23)
CO2: 25 mmol/L (ref 22–32)
Calcium: 9.6 mg/dL (ref 8.9–10.3)
Chloride: 107 mmol/L (ref 98–111)
Creatinine: 2.95 mg/dL — ABNORMAL HIGH (ref 0.44–1.00)
GFR, Estimated: 17 mL/min — ABNORMAL LOW (ref >60)
Glucose, Bld: 147 mg/dL — ABNORMAL HIGH (ref 70–99)
Potassium: 3.3 mmol/L — ABNORMAL LOW (ref 3.5–5.1)
Sodium: 142 mmol/L (ref 135–145)
Total Bilirubin: 0.5 mg/dL (ref 0.0–1.2)
Total Protein: 7.7 g/dL (ref 6.5–8.1)

## 2024-04-05 NOTE — Progress Notes (Signed)
 Patient Care Team: Verma Gobble, NP as PCP - General (Geriatric Medicine) Avanell Leigh, MD as PCP - Cardiology (Cardiology) Cameron Cea, MD as Consulting Physician (Oncology) Sim Dryer, MD as Consulting Physician (General Surgery) Althea Atkinson, RPH-CPP as Pharmacist (Hematology and Oncology) Bridgett Camps, Amber Bail, MD as Consulting Physician (Gastroenterology)  DIAGNOSIS:  Encounter Diagnosis  Name Primary?   Malignant neoplasm of upper-outer quadrant of left breast in female, estrogen receptor positive ([name]) Yes    SUMMARY OF ONCOLOGIC HISTORY: Oncology History  Breast cancer of 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)  03/31/2016 Initial Diagnosis   Screening detected left breast mass, 2 nodules, 1.9 x 1.6 x 0.8 cm= fat necrosis; 8 x 7 x 7 mm= grade 2 IDC ER 100%, PR 100%, HER-2 negative ratio 1.39, Ki-67 20%   04/17/2016 Surgery   Left lumpectomy ([name]): IDC grade 2, 1.3 cm, with DCIS, margins negative, 0/4 lymph nodes negative, T1 cN0 stage IA pathologic stage, ER 100%, PR 100%, HER-2 negative ratio 1.39, Ki-67 20% Oncotype DX score 20, 13% ROR, intermediate risk   04/17/2016 Oncotype testing   Recurrence score: 20; ROR 15% (intermediate risk)    05/27/2016 - 07/13/2016 Radiation Therapy   Adjuvant radiation therapy [facility]): Left breast treated with breath hold to 50.4 Gy in 28 fractions at 1.8 Gy/fraction.  Left breast boosted to 10 Gy in 5 fractions at 2 Gy/fraction   07/23/2016 -  Anti-estrogen oral therapy   Anastrozole  1 mg switched to letrozole  04/22/2017 due to hair loss, switch to tamoxifen  10/22/2017 due to hair loss from letrozole  as well   Malignant neoplasm of upper-outer quadrant of right breast in female, estrogen receptor positive ([name])  08/04/2021 Initial Diagnosis   Malignant neoplasm of upper-outer quadrant of right breast in female, estrogen receptor positive ([name])    Relapse/Recurrence   2.2 cm ILC ER 100%, PR 90%, Her 2 Neg, Ki 67: 25%,  Size 2.5 cm   08/04/2021 Cancer Staging   Staging form: Breast, AJCC 8th Edition - Clinical: Stage IB (cT2, cN0, cM0, G2, ER+, PR+, HER2-) - Signed by Cameron Cea, MD on 08/04/2021 Stage prefix: Initial diagnosis Histologic grading system: 3 grade system   09/02/2021 Surgery   Right lumpectomy: Grade 2 ILC 3.1 cm, margins negative, LVI present, 4/6 lymph nodes macrometastases, 2 lymph nodes isolated tumor cells, ER 100%, PR 90%, Ki-67 25%, HER2 negative   10/17/2021 - 01/30/2022 Chemotherapy   Patient is on Treatment Plan : BREAST Adjuvant CMF IV q21d      Genetic Testing   Negative genetic testing. No pathogenic variants identified on the [employer] CustomNext+RNA panel. The report date is 03/04/2022.  The CustomNext-Cancer+RNAinsight panel offered by [employer] includes sequencing and rearrangement analysis for the following 47 genes:  APC, ATM, AXIN2, BARD1, BMPR1A, BRCA1, BRCA2, BRIP1, CDH1, CDK4, CDKN2A, CHEK2, DICER1, EPCAM, GREM1, HOXB13, MEN1, MLH1, MSH2, MSH3, MSH6, MUTYH, NBN, NF1, NF2, NTHL1, PALB2, PMS2, POLD1, POLE, PTEN, RAD51C, RAD51D, RECQL, RET, SDHA, SDHAF2, SDHB, SDHC, SDHD, SMAD4, SMARCA4, STK11, TP53, TSC1, TSC2, and VHL.  RNA data is routinely analyzed for use in variant interpretation for all genes.     CHIEF COMPLIANT: Follow-up to review results of CT scans for metastatic breast cancer  HISTORY OF PRESENT ILLNESS: History of Present Illness Michele Page is a 68 year old female with metastatic breast cancer who presents for follow-up on her treatment and recent imaging results.  She is undergoing treatment with Ibrance  for metastatic breast cancer with metastases  at T12 and L2. She experiences fatigue and tiredness, likely related to her medication. Her blood tests show consistently low white blood cell counts.  Her kidney function is fluctuating, with a creatinine level of 2.95 and a BUN of 31. She is under the care of a kidney specialist.  She has slight anemia  and low potassium levels.     ALLERGIES:  has no known allergies.  MEDICATIONS:  Current Outpatient Medications  Medication Sig Dispense Refill   aspirin  EC 81 MG tablet Take 1 tablet (81 mg total) by mouth daily. (Patient not taking: Reported on 02/03/2024) 30 tablet 6   atorvastatin  (LIPITOR) 20 MG tablet Take 1 tablet (20 mg total) by mouth daily. 90 tablet 3   GEMTESA  75 MG TABS Take 1 tablet by mouth once daily 90 tablet 0   labetalol  (NORMODYNE ) 300 MG tablet TAKE 2 TABLETS THREE TIMES DAILY 540 tablet 3   letrozole  (FEMARA ) 2.5 MG tablet TAKE 1 TABLET EVERY DAY 90 tablet 3   NIFEdipine  (PROCARDIA  XL/NIFEDICAL XL) 60 MG 24 hr tablet Take 1 tablet (60 mg total) by mouth 2 (two) times daily. 60 tablet 0   palbociclib  (IBRANCE ) 75 MG tablet Take 1 tablet (75 mg total) by mouth daily. Take for 21 days on, 7 days off, repeat every 28 days. 21 tablet 3   zolpidem  (AMBIEN ) 5 MG tablet Take 1 tablet (5 mg total) by mouth at bedtime as needed for sleep. 30 tablet 0   No current facility-administered medications for this visit.    PHYSICAL EXAMINATION: ECOG PERFORMANCE STATUS: 1 - Symptomatic but completely ambulatory  Vitals:   04/05/24 0940  BP: 127/68  Pulse: 82  Resp: 16  Temp: 97.7 F (36.5 C)  SpO2: 95%   Filed Weights   04/05/24 0940  Weight: 196 lb 3.2 oz (89 kg)      LABORATORY DATA:  I have reviewed the data as listed    Latest Ref Rng & Units 04/05/2024    8:56 AM 02/03/2024    8:44 AM 12/15/2023    5:01 PM  CMP  Glucose 70 - 99 mg/dL 161  096  045   BUN 8 - 23 mg/dL 31  23  39   Creatinine 0.44 - 1.00 mg/dL 4.09  8.11  9.14   Sodium 135 - 145 mmol/L 142  143  136   Potassium 3.5 - 5.1 mmol/L 3.3  4.0  3.5   Chloride 98 - 111 mmol/L 107  107  103   CO2 22 - 32 mmol/L 25  26  22    Calcium  8.9 - 10.3 mg/dL 9.6  9.7  9.1   Total Protein 6.5 - 8.1 g/dL 7.7  7.6  7.2   Total Bilirubin 0.0 - 1.2 mg/dL 0.5  0.5  0.5   Alkaline Phos 38 - 126 U/L 105  102  89   AST  15 - 41 U/L 17  20  22    ALT 0 - 44 U/L 10  12  16      Lab Results  Component Value Date   WBC 2.9 (L) 04/05/2024   HGB 9.9 (L) 04/05/2024   HCT 28.2 (L) 04/05/2024   MCV 96.2 04/05/2024   PLT 315 04/05/2024   NEUTROABS 1.9 04/05/2024    ASSESSMENT & PLAN:  Breast cancer of 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) 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, 1.3 cm, with DCIS, margins negative, 0/4 lymph nodes negative, T1 cN0 stage IA pathologic stage,  ER 100%, PR 100%, HER-2 negative ratio 1.39, Ki-67 20% (Originally 2 nodules were detected on screening mammogram 1.9 cm= fat necrosis and 8 mm IDC) Oncotype DX score 20, 13% risk of recurrence, intermediate risk Adjuvant radiation therapy started 07/20/2017completed 07/13/2016     09/02/2021:Right lumpectomy: Grade 2 ILC 3.1 cm, margins negative, LVI present, 4/6 lymph nodes macrometastases, 2 lymph nodes isolated tumor cells, ER 100%, PR 90%, Ki-67 25%, HER2 negative   Treatment plan/summary: 1. systemic chemotherapy with CMF x6 cycles completed 01/30/2022 2. adjuvant radiation therapy completing 04/09/2022 3.  For the continued antiestrogen therapy (previously anastrozole  started 07/23/2016 switched to letrozole  2018 switched to tamoxifen  2018, switched back to anastrozole ) hair loss was the main problem, may consider exemestane --------------------------------------------------------------------------------------  Current treatment: Letrozole  with Ibrance .  Patient could not tolerate Verzinio Toxicities: Fatigue Leukopenia: ANC 1.9  Signatera:  06/12/2022: Positive (0.52) 09/02/2022: Positive (68.11) Mar 22, 2023: Positive (106.54) 07/06/2023: Positive (662) 09/13/2023: Positive (57.14) 03/07/2024: 109.06   CT CAP 07/10/2022: No metastatic disease.  Tiny pulmonary nodules nonspecific. CT CAP 04/02/2023: New peripherally sclerotic lesion superior endplate L2 suspicious for treated metastasis or Schmorl's node.  No other sites  of metastatic disease identified CT CAP 07/06/2023: Subpectoral lymph node 7 mm (used to be 5 to 6 mm) ill-defined groundglass density right lower lobe bone metastases stable T11 L2 CT CAP 09/24/2023: Stable bone metastasis T11 L2 MRI neck: 10/26/2023: Left thyroid  nodule 2.5 cm unchanged, no findings in the neck 10/27/2023: Left leg x-rays: Mild osteoarthritis 12/15/2023: CT abdomen: Stable T11 and L2 03/23/24: CT CAP: Stable T 11 and L2  Elevated creatinine: I discussed with her to follow-up with nephrology.  Encouraged her to drink more fluids. Scans will be repeated in 6 months. RTC in 3 months with labs  Assessment & Plan Metastatic breast cancer Metastatic breast cancer with treated metastases at T12 and L2, well-controlled with Palbociclib . Fatigue noted. Stage 4 concerns addressed, condition well-managed. - Continue Palbociclib  (Ibrance ) 75 MG oral daily, 21 days on, 7 days off. - Continue Letrozole  (Femara ) 2.5 MG oral daily. - Schedule CT scans every 6 months. - Schedule blood work in 3 months.  Anemia Mild anemia likely related to cancer treatment, stable with no immediate concerns.  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  Chronic kidney disease, unspecified Chronic kidney disease with creatinine level at 2.95, indicating worsening function. Advised increased water intake. Nephrologist follow-up recommended. - Advise increasing water intake to at least 50-60 ounces daily. - Follow up with nephrologist. - Monitor kidney function closely.      No orders of the defined types were placed in this encounter.  The patient has a good understanding of the overall plan. she agrees with it. she will call with any problems that may develop before the next visit here. Total time spent: 30 mins including face to face time and time spent for planning, charting and co-ordination of  care   Viinay K Tihanna Goodson, MD 04/05/24

## 2024-04-06 ENCOUNTER — Telehealth: Payer: Self-pay | Admitting: Hematology and Oncology

## 2024-04-06 LAB — CANCER ANTIGEN 27.29: CA 27.29: 23.1 U/mL (ref 0.0–38.6)

## 2024-04-06 NOTE — Telephone Encounter (Signed)
 Confirmed with pt scheduled appt date and time

## 2024-04-07 ENCOUNTER — Other Ambulatory Visit (HOSPITAL_COMMUNITY): Payer: Self-pay | Admitting: Pharmacy Technician

## 2024-04-07 ENCOUNTER — Other Ambulatory Visit (HOSPITAL_COMMUNITY): Payer: Self-pay

## 2024-04-07 NOTE — Progress Notes (Signed)
 Clinical Intervention Note  Clinical Intervention Notes: Patient missed a dose and asked if she could take it later in the day.  I counseled her on missed dose instructions and informed her to take if remembered in the same day, but not to take 2 doses if she remembers the following day.  All quesitons were answered and the patient was understanding.   Clinical Intervention Outcomes: Improved therapy adherence   Malachi Screws Specialty Pharmacist

## 2024-04-07 NOTE — Progress Notes (Signed)
 Specialty Pharmacy Refill Coordination Note  Michele Page is a 68 y.o. female contacted today regarding refills of specialty medication(s) Palbociclib  (IBRANCE )   Patient requested Delivery   Delivery date: 04/13/24   Verified address: [address], [place], [place] [address]   Medication will be filled on 04/12/24.

## 2024-04-18 NOTE — Telephone Encounter (Signed)
 This encounter was created in error - please disregard.

## 2024-05-04 ENCOUNTER — Other Ambulatory Visit: Payer: Self-pay

## 2024-05-09 ENCOUNTER — Other Ambulatory Visit (HOSPITAL_COMMUNITY): Payer: Self-pay

## 2024-05-09 ENCOUNTER — Other Ambulatory Visit: Payer: Self-pay

## 2024-05-09 NOTE — Progress Notes (Signed)
 Specialty Pharmacy Refill Coordination Note  Spoke with Lael, [place] (Self).   ILEANA CHALUPA is a 68 y.o. female contacted today regarding refills of specialty medication(s) Palbociclib  (IBRANCE )  Next cycle approx: 05/15/24.   Patient requested: Delivery   Delivery date: 05/11/24   Verified address: [address]  [name] [address]  Medication will be filled on 05/10/24.

## 2024-05-11 ENCOUNTER — Other Ambulatory Visit: Payer: Self-pay

## 2024-05-18 ENCOUNTER — Telehealth: Payer: Self-pay

## 2024-05-18 NOTE — Telephone Encounter (Signed)
 Message routed to PCP [name], [name], NP as Michele Page.

## 2024-05-18 NOTE — Telephone Encounter (Signed)
 Copied from CRM [id number]. Topic: Referral - Request for Referral >> May 18, 2024  2:49 PM Carmell R wrote: Did the patient discuss referral with their provider in the last year? No (If No - schedule appointment) (If Yes - send message)  Appointment offered? Yes, but already has appt scheduled for 06/05/24  Type of order/referral and detailed reason for visit: Urologist for Left Kidney failure   Preference of office, provider, location: Dr. Alberta Batman, [phone] [facility] location  If referral order, have you been seen by this specialty before? No (If Yes, this issue or another issue? When? Where?  Can we respond through MyChart? Yes

## 2024-05-18 NOTE — Telephone Encounter (Signed)
 Noted will follow up with patient at time of appt.

## 2024-05-18 NOTE — Telephone Encounter (Signed)
 Noted

## 2024-05-22 ENCOUNTER — Other Ambulatory Visit: Payer: Self-pay | Admitting: *Deleted

## 2024-05-22 ENCOUNTER — Telehealth: Payer: Self-pay | Admitting: *Deleted

## 2024-05-22 DIAGNOSIS — C50412 Malignant neoplasm of upper-outer quadrant of left female breast: Secondary | ICD-10-CM

## 2024-05-22 NOTE — Telephone Encounter (Signed)
 Received triage message that pt had concerns for foamy/bubbles in urine that had started 2 weeks ago. Pt stated that she had no UTI symptoms or pain when urinating. Scheduled pt for lab to do a UA to check for proteinuria and UTI. Pt made aware of appt and verbalized understanding.

## 2024-05-24 ENCOUNTER — Other Ambulatory Visit: Payer: Self-pay | Admitting: Nurse Practitioner

## 2024-05-24 ENCOUNTER — Inpatient Hospital Stay: Attending: Hematology and Oncology

## 2024-05-24 ENCOUNTER — Telehealth: Payer: Self-pay | Admitting: Nurse Practitioner

## 2024-05-24 ENCOUNTER — Other Ambulatory Visit: Payer: Self-pay | Admitting: *Deleted

## 2024-05-24 DIAGNOSIS — C50412 Malignant neoplasm of upper-outer quadrant of left female breast: Secondary | ICD-10-CM

## 2024-05-24 DIAGNOSIS — M1A0420 Idiopathic chronic gout, left hand, without tophus (tophi): Secondary | ICD-10-CM

## 2024-05-24 DIAGNOSIS — Z170 Estrogen receptor positive status [ER+]: Secondary | ICD-10-CM | POA: Diagnosis not present

## 2024-05-24 DIAGNOSIS — R8290 Unspecified abnormal findings in urine: Secondary | ICD-10-CM | POA: Insufficient documentation

## 2024-05-24 LAB — URINALYSIS, COMPLETE (UACMP) WITH MICROSCOPIC
Bacteria, UA: NONE SEEN
Bilirubin Urine: NEGATIVE
Glucose, UA: NEGATIVE mg/dL
Hgb urine dipstick: NEGATIVE
Ketones, ur: 5 mg/dL — AB
Nitrite: NEGATIVE
Protein, ur: 30 mg/dL — AB
Specific Gravity, Urine: 1.018 (ref 1.005–1.030)
pH: 5 (ref 5.0–8.0)

## 2024-05-24 NOTE — Telephone Encounter (Signed)
 Yes she should be taking her allopurinol  to decrease her uric acid level she was previously on 50 mg every other day-- needs to be taking as prescribed as her level was high on labs in January

## 2024-05-24 NOTE — Progress Notes (Signed)
 Received pt UA and reviewed with Morna, NP.  Per NP pt needing Urine culture added to determine if antibiotic is needed.  Lab added, pt notified and verbalized understanding.

## 2024-05-24 NOTE — Telephone Encounter (Signed)
 Refill request received for Allopurinol . Called patient to triage. Patient says that she has not had a gout flare since January 2025, but thought she was supposed to be taking the medication every other day.  Pt says she last took the medication 2 months ago, still no flare. Per chart review, November 29, 2023 encourage PCP discontinued the medication in Feb. 2025.  Pt wants to know if she should still be taking it to prevent gout flare.  Pharamcy confirmed, will route to clinic to advise.

## 2024-05-24 NOTE — Telephone Encounter (Signed)
 This encounter was created in error - please disregard.

## 2024-05-24 NOTE — Telephone Encounter (Unsigned)
 Copied from CRM [id number]. Topic: Clinical - Medication Refill >> May 24, 2024  2:31 PM Miquel SAILOR wrote: Medication: allopurinol  (ZYLOPRIM ) 100 MG tablet  Has the patient contacted their pharmacy? Yes (Agent: If no, request that the patient contact the pharmacy for the refill. If patient does not wish to contact the pharmacy document the reason why and proceed with request.) (Agent: If yes, when and what did the pharmacy advise?)  This is the patient's preferred pharmacy:  [facility] [address] ([address]), [place] - [address] [place] ([place]) [place] [address] Phone: [phone] Fax: [phone]    Is this the correct pharmacy for this prescription? Yes If no, delete pharmacy and type the correct one.   Has the prescription been filled recently? Yes  Is the patient out of the medication? Yes  Has the patient been seen for an appointment in the last year OR does the patient have an upcoming appointment? Yes  Can we respond through MyChart? Yes  Agent: Please be advised that Rx refills may take up to 3 business days. We ask that you follow-up with your pharmacy.

## 2024-05-25 LAB — URINE CULTURE: Culture: <10000 — AB

## 2024-05-25 MED ORDER — ALLOPURINOL 100 MG PO TABS
50.0000 mg | ORAL_TABLET | ORAL | 0 refills | Status: DC
Start: 2024-05-25 — End: 2024-09-27

## 2024-05-25 NOTE — Telephone Encounter (Signed)
 Spoke with patient this morning to let her know the response of Caro, Jessica K, NP and patient is asking for another refill for allopurinol  be sent to the pharmacy.  Message sent to Caro Harlene POUR, NP

## 2024-05-25 NOTE — Telephone Encounter (Signed)
 Routing to clinical intake assistant for today, Heather Left, Engineer, site

## 2024-05-25 NOTE — Telephone Encounter (Signed)
 Rx sent.

## 2024-05-26 ENCOUNTER — Telehealth: Payer: Self-pay | Admitting: *Deleted

## 2024-05-26 NOTE — Telephone Encounter (Signed)
 Per NP pt urine culture negative for bacteria.  NP states pt needs to f/u with PCP to see if any medications she is taking could cause foamy urine.  Pt educated to increase p.o intake with water and to f/u with PCP, pt verbalized understanding.

## 2024-05-31 ENCOUNTER — Telehealth: Payer: Self-pay

## 2024-05-31 NOTE — Telephone Encounter (Signed)
 [facility] called and spoke to [facility], Pharmacologist about the refill(s) allopurinol  requested as per the recording. Advised it was sent on 05/25/24 #45/0 refill(s) to [facility] [phone]. She says they will reach out to the patient to see if she picked it up or would like it transferred to [facility], because they spoke to the patient on 05/20/24 regarding this medication.    Copied from CRM [id number]. Topic: Clinical - Prescription Issue >> May 31, 2024  9:19 AM Laurier BROCKS wrote: Reason for CRM: Camie from [facility] pharmacy called to confirm if the following medication could be refilled. Caller was advised Rx refill was initiated on 05/24/24. [facility] is stating they never recv'd the request.

## 2024-05-31 NOTE — Telephone Encounter (Signed)
 Noted

## 2024-06-01 DIAGNOSIS — M1812 Unilateral primary osteoarthritis of first carpometacarpal joint, left hand: Secondary | ICD-10-CM | POA: Diagnosis not present

## 2024-06-01 DIAGNOSIS — M654 Radial styloid tenosynovitis [de Quervain]: Secondary | ICD-10-CM | POA: Diagnosis not present

## 2024-06-05 ENCOUNTER — Ambulatory Visit (INDEPENDENT_AMBULATORY_CARE_PROVIDER_SITE_OTHER): Payer: Medicare HMO | Admitting: Nurse Practitioner

## 2024-06-05 VITALS — BP 138/76 | HR 80 | Temp 98.1°F | Resp 13 | Ht 62.0 in | Wt 195.4 lb

## 2024-06-05 DIAGNOSIS — E782 Mixed hyperlipidemia: Secondary | ICD-10-CM

## 2024-06-05 DIAGNOSIS — C50412 Malignant neoplasm of upper-outer quadrant of left female breast: Secondary | ICD-10-CM

## 2024-06-05 DIAGNOSIS — N184 Chronic kidney disease, stage 4 (severe): Secondary | ICD-10-CM | POA: Diagnosis not present

## 2024-06-05 DIAGNOSIS — I10 Essential (primary) hypertension: Secondary | ICD-10-CM | POA: Diagnosis not present

## 2024-06-05 DIAGNOSIS — F5101 Primary insomnia: Secondary | ICD-10-CM

## 2024-06-05 DIAGNOSIS — M1A0420 Idiopathic chronic gout, left hand, without tophus (tophi): Secondary | ICD-10-CM | POA: Diagnosis not present

## 2024-06-05 DIAGNOSIS — I69359 Hemiplegia and hemiparesis following cerebral infarction affecting unspecified side: Secondary | ICD-10-CM | POA: Diagnosis not present

## 2024-06-05 DIAGNOSIS — N3281 Overactive bladder: Secondary | ICD-10-CM | POA: Diagnosis not present

## 2024-06-05 DIAGNOSIS — Z170 Estrogen receptor positive status [ER+]: Secondary | ICD-10-CM

## 2024-06-05 NOTE — Assessment & Plan Note (Signed)
 Not currently taking allopurinol - awaiting to get filled from pharmacy, no recent flares

## 2024-06-05 NOTE — Assessment & Plan Note (Signed)
 Continues on lipitor, will get fasting labs and next follow up

## 2024-06-05 NOTE — Assessment & Plan Note (Signed)
 Chronic and followed by nephrology Encourage proper hydration Follow metabolic panel Avoid nephrotoxic meds (NSAIDS)

## 2024-06-05 NOTE — Assessment & Plan Note (Signed)
 Gemtesa  was not effective,  Currently on oxybuytin and has urology referral from nephrology

## 2024-06-05 NOTE — Assessment & Plan Note (Signed)
 She reports uncontrolled bp and that labetolol is not working however reviewed bp from prior visit here, with nephrologist and cardiologist and at goal Reports she can not take hydralazine  because it does not work as well. She wants to change medication at this time.  Unable to take ace/arb due to CKD Will refer to [facility] clinic for ongoing eval and recs

## 2024-06-05 NOTE — Assessment & Plan Note (Signed)
 Not needing Ambien 

## 2024-06-05 NOTE — Assessment & Plan Note (Signed)
 Stable, continue ASA

## 2024-06-05 NOTE — Assessment & Plan Note (Signed)
 Has had weight loss but only eating 1 meal a day.  -education provided on healthy weight loss making sure through increase in physical activity and proper nutrition

## 2024-06-05 NOTE — Progress Notes (Signed)
 Careteam: Patient Care Team: Caro Harlene POUR, NP as PCP - General (Geriatric Medicine) Court Dorn PARAS, MD as PCP - Cardiology (Cardiology) Odean Potts, MD as Consulting Physician (Oncology) Vanderbilt Ned, MD as Consulting Physician (General Surgery) Lucila Norleen LABOR, RPH-CPP as Pharmacist (Hematology and Oncology) Albertus, Gordy HERO, MD as Consulting Physician (Gastroenterology)  PLACE OF SERVICE:  [facility]  Advanced Directive information    No Known Allergies  Chief Complaint  Patient presents with   Medical Management of Chronic Issues    6 month follow up.  Referral to urologist.     HPI:  Discussed the use of AI scribe software for clinical note transcription with the patient, who gave verbal consent to proceed.  History of Present Illness Michele Page is a 68 year old female with hypertension and gout who presents for a six-month follow-up.  Her current medication, labetalol , taken three times daily, is not effectively controlling her blood pressure. It maintains her blood pressure for about an hour to an hour and a half before it rises again, reaching 180-190 mmHg if not managed. No headaches or chest pain. Blood pressure review in chart appear controlled, she does not have a record of home BPs Reports hydralazine  did not work well for her in the past Unable to take ace/arb due to CKD.   She has a history of gout and has not been taking allopurinol  for about two months. She attempted to refill her prescription but was informed by the pharmacist that she could not obtain it until the 27th of the month. Her uric acid levels are high, and she plans to restart the medication once available.  She is currently taking 10 mg of oxybutynin once daily for overactive bladder, which she finds more effective than Gemtesa . She was previously prescribed Gemtesa  but found it ineffective so nephrologist stopped and started oxybutynin 5 mg daily, this was not effective so  increased to 10 mg daily and works better. She was referred to urology but has not made the appt yet.  She is on letrozole  and Ibrance  for breast cancer management.   She has stopped taking Ambien  and requested its removal from her medication list.  She reports eating only once a day and has been losing weight  No constipation, dry mouth, or dry eyes. She is unsure about the cause of her decreased appetite.    Review of Systems:  Review of Systems  Constitutional:  Negative for chills, fever and weight loss.  HENT:  Negative for tinnitus.   Respiratory:  Negative for cough, sputum production and shortness of breath.   Cardiovascular:  Negative for chest pain, palpitations and leg swelling.  Gastrointestinal:  Negative for abdominal pain, constipation, diarrhea and heartburn.  Genitourinary:  Negative for dysuria, frequency and urgency.  Musculoskeletal:  Negative for back pain, falls, joint pain and myalgias.  Skin: Negative.   Neurological:  Negative for dizziness and headaches.  Psychiatric/Behavioral:  Negative for depression and memory loss. The patient does not have insomnia.     Past Medical History:  Diagnosis Date   Anemia    past hx many yrs ago    Arthritis    lt knee   Breast cancer ([name])    Breast cancer of 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) 04/02/2016   Chronic kidney disease    CKD   Colon polyps    COPD (chronic obstructive pulmonary disease) ([name])    GERD (gastroesophageal reflux disease)    years  ago- not current    Hyperlipidemia    Hypertension    Multiple sclerosis ([name])    Personal history of chemotherapy    Personal history of radiation therapy    Stroke ([name])    2013, mild cognitive deficits   Vision abnormalities    Past Surgical History:  Procedure Laterality Date   BREAST BIOPSY Left 03/31/2016   BREAST BIOPSY Right 07/24/2021   BREAST LUMPECTOMY Left 04/17/2016   BREAST LUMPECTOMY Right 09/02/2021   BREAST LUMPECTOMY WITH  RADIOACTIVE SEED AND SENTINEL LYMPH NODE BIOPSY Right 09/02/2021   Procedure: RIGHT BREAST LUMPECTOMY WITH RADIOACTIVE SEED AND SENTINEL LYMPH NODE BIOPSY;  Surgeon: Vanderbilt Ned, MD;  Location: [facility];  Service: General;  Laterality: Right;   COLONOSCOPY     HYSTEROSCOPY WITH D & C N/A 12/19/2015   Procedure: DILATATION AND CURETTAGE /HYSTEROSCOPY;  Surgeon: Duwaine Blumenthal, DO;  Location: [facility];  Service: Gynecology;  Laterality: N/A;   POLYPECTOMY     PORTACATH PLACEMENT Right 10/16/2021   Procedure: INSERTION PORT-A-CATH;  Surgeon: Vanderbilt Ned, MD;  Location: [facility];  Service: General;  Laterality: Right;   RADIOACTIVE SEED GUIDED PARTIAL MASTECTOMY WITH AXILLARY SENTINEL LYMPH NODE BIOPSY Left 04/17/2016   Procedure: RADIOACTIVE SEED GUIDED PARTIAL MASTECTOMY WITH AXILLARY SENTINEL LYMPH NODE BIOPSY;  Surgeon: Ned Vanderbilt, MD;  Location: [facility];  Service: General;  Laterality: Left;  RADIOACTIVE SEED GUIDED PARTIAL MASTECTOMY WITH AXILLARY SENTINEL LYMPH NODE BIOPSY    TUBAL LIGATION     VAGINAL DELIVERY     x3   Social History:   reports that she quit smoking about 19 years ago. Her smoking use included cigarettes. She started smoking about 44 years ago. She has a 12.5 pack-year smoking history. She has never used smokeless tobacco. She reports current alcohol use. She reports that she does not use drugs.  Family History  Problem Relation Age of Onset   Hypertension Mother    Stroke Mother    Stroke Sister    Alcohol abuse Brother    HIV/AIDS Brother    Breast cancer Cousin        dx 42s   Colon cancer Neg Hx    Colon polyps Neg Hx    Esophageal cancer Neg Hx    Rectal cancer Neg Hx    Stomach cancer Neg Hx     Medications: Patient's Medications  New Prescriptions   No medications on file  Previous Medications   ALLOPURINOL  (ZYLOPRIM ) 100 MG TABLET    Take 0.5 tablets (50 mg total) by mouth every other day.    ASPIRIN  EC 81 MG TABLET    Take 1 tablet (81 mg total) by mouth daily.   ATORVASTATIN  (LIPITOR) 20 MG TABLET    Take 1 tablet (20 mg total) by mouth daily.   GEMTESA  75 MG TABS    Take 1 tablet by mouth once daily   LABETALOL  (NORMODYNE ) 300 MG TABLET    TAKE 2 TABLETS THREE TIMES DAILY   LETROZOLE  (FEMARA ) 2.5 MG TABLET    TAKE 1 TABLET EVERY DAY   NIFEDIPINE  (PROCARDIA  XL/NIFEDICAL XL) 60 MG 24 HR TABLET    Take 1 tablet (60 mg total) by mouth 2 (two) times daily.   OXYBUTYNIN (DITROPAN) 5 MG TABLET    Take 10 mg by mouth daily.   PALBOCICLIB  (IBRANCE ) 75 MG TABLET    Take 1 tablet (75 mg total) by mouth daily. Take for 21 days on, 7  days off, repeat every 28 days.  Modified Medications   No medications on file  Discontinued Medications   ZOLPIDEM  (AMBIEN ) 5 MG TABLET    Take 1 tablet (5 mg total) by mouth at bedtime as needed for sleep.    Physical Exam:  Vitals:   06/05/24 1248  BP: 138/76  Pulse: 80  Resp: 13  Temp: 98.1 F (36.7 C)  SpO2: 98%  Weight: 195 lb 6.4 oz (88.6 kg)  Height: 5' 2 (1.575 m)   Body mass index is 35.74 kg/m. Wt Readings from Last 3 Encounters:  06/05/24 195 lb 6.4 oz (88.6 kg)  04/05/24 196 lb 3.2 oz (89 kg)  01/20/24 201 lb 6.4 oz (91.4 kg)    Physical Exam Constitutional:      General: She is not in acute distress.    Appearance: She is well-developed. She is not diaphoretic.  HENT:     Head: Normocephalic and atraumatic.     Mouth/Throat:     Pharynx: No oropharyngeal exudate.  Eyes:     Conjunctiva/sclera: Conjunctivae normal.     Pupils: Pupils are equal, round, and reactive to light.  Cardiovascular:     Rate and Rhythm: Normal rate and regular rhythm.     Heart sounds: Normal heart sounds.  Pulmonary:     Effort: Pulmonary effort is normal.     Breath sounds: Normal breath sounds.  Abdominal:     General: Bowel sounds are normal.     Palpations: Abdomen is soft.  Musculoskeletal:     Cervical back: Normal range of motion  and neck supple.     Right lower leg: No edema.     Left lower leg: No edema.  Skin:    General: Skin is warm and dry.  Neurological:     Mental Status: She is alert.  Psychiatric:        Mood and Affect: Mood normal.     Labs reviewed: Basic Metabolic Panel: Recent Labs    12/15/23 1701 02/03/24 0844 04/05/24 0856  NA 136 143 142  K 3.5 4.0 3.3*  CL 103 107 107  CO2 22 26 25   GLUCOSE 104* 122* 147*  BUN 39* 23 31*  CREATININE 2.26* 2.21* 2.95*  CALCIUM  9.1 9.7 9.6   Liver Function Tests: Recent Labs    12/15/23 1701 02/03/24 0844 04/05/24 0856  AST 22 20 17   ALT 16 12 10   ALKPHOS 89 102 105  BILITOT 0.5 0.5 0.5  PROT 7.2 7.6 7.7  ALBUMIN 3.7 4.7 4.7   Recent Labs    07/20/23 1343  LIPASE 49   No results for input(s): AMMONIA in the last 8760 hours. CBC: Recent Labs    11/29/23 1335 12/15/23 1701 02/03/24 0844 04/05/24 0856  WBC 3.9 3.9* 3.2* 2.9*  NEUTROABS 2,527  --  2.3 1.9  HGB 10.3* 10.1* 10.6* 9.9*  HCT 29.6* 30.7* 31.0* 28.2*  MCV 101.0* 104.8* 101.3* 96.2  PLT 274 270 394 315   Lipid Panel: No results for input(s): CHOL, HDL, LDLCALC, TRIG, CHOLHDL, LDLDIRECT in the last 8760 hours. TSH: No results for input(s): TSH in the last 8760 hours. A1C: Lab Results  Component Value Date   HGBA1C 5.8 (H) 05/13/2020     Assessment/Plan  Essential hypertension Assessment & Plan: She reports uncontrolled bp and that labetolol is not working however reviewed bp from prior visit here, with nephrologist and cardiologist and at goal. Does not have bp log.  Reports she can not take  hydralazine  because it does not work as well. She wants to change medication at this time.  Unable to take ace/arb due to CKD Will refer to htn clinic for ongoing eval and recs  Orders: -     [facility]ulatory referral to [facility]  Overactive bladder Assessment & Plan: Gemtesa  was not effective,  Currently on oxybuytin and has  urology referral from nephrology   Chronic gout of left hand, unspecified cause Assessment & Plan: Not currently taking allopurinol - awaiting to get filled from pharmacy, no recent flares   Malignant neoplasm of upper-outer quadrant of left breast in female, estrogen receptor positive ([name]) Followed by oncology for treatment  Primary insomnia Assessment & Plan: Not needing Ambien     CKD (chronic kidney disease) stage 4, GFR 15-29 ml/min ([name]) Assessment & Plan: Chronic and followed by nephrology Encourage proper hydration Follow metabolic panel Avoid nephrotoxic meds (NSAIDS)   Morbid obesity ([name]) Assessment & Plan: Has had weight loss but only eating 1 meal a day.  -education provided on healthy weight loss making sure through increase in physical activity and proper nutrition     Hemiplegia following CVA (cerebrovascular accident) [facility]) Assessment & Plan: Stable, continue ASA   Mixed hyperlipidemia Assessment & Plan: Continues on lipitor, will get fasting labs and next follow up      Return in about 3 months (around 09/05/2024) for routine follow up.  Rual Vermeer K. Caro BODILY [facility] [phone]

## 2024-06-05 NOTE — Patient Instructions (Signed)
 Stop gemtesa    Make appt for urologist.   Referral for hypertension clinic given

## 2024-06-08 ENCOUNTER — Telehealth: Payer: Self-pay

## 2024-06-08 ENCOUNTER — Other Ambulatory Visit: Payer: Self-pay

## 2024-06-08 ENCOUNTER — Other Ambulatory Visit: Payer: Self-pay | Admitting: Hematology and Oncology

## 2024-06-08 ENCOUNTER — Other Ambulatory Visit (HOSPITAL_COMMUNITY): Payer: Self-pay

## 2024-06-08 MED ORDER — PALBOCICLIB 75 MG PO TABS
75.0000 mg | ORAL_TABLET | Freq: Every day | ORAL | 3 refills | Status: DC
  Filled 2024-06-08: qty 21, 28d supply, fill #0
  Filled 2024-06-27: qty 21, 28d supply, fill #1
  Filled 2024-07-28: qty 21, 28d supply, fill #2
  Filled 2024-08-21: qty 21, 28d supply, fill #3

## 2024-06-08 NOTE — Telephone Encounter (Signed)
 Oral Oncology Patient Advocate Encounter   Received notification that a prior authorization Renewal for Ibrance  is required.   PA submitted on 06/08/24 Key [id number] Status is pending      Charlott Hamilton,  CPhT-Adv  she/her/hers [facility]  [facility] Pharmacy Technician Patient Advocate Specialist III [facility] Phone: [phone]  Fax: [phone] [email or web address]

## 2024-06-08 NOTE — Progress Notes (Signed)
 Specialty Pharmacy Refill Coordination Note  Michele Page is a 68 y.o. female contacted today regarding refills of specialty medication(s) Palbociclib  (IBRANCE )   Patient requested Delivery   Delivery date: 06/09/24   Verified address: [address]  [name] [address]   Medication will be filled on 06/08/24, pending refill approval.

## 2024-06-09 ENCOUNTER — Other Ambulatory Visit: Payer: Self-pay

## 2024-06-09 ENCOUNTER — Other Ambulatory Visit (HOSPITAL_COMMUNITY): Payer: Self-pay

## 2024-06-09 NOTE — Telephone Encounter (Signed)
 Oral Oncology Patient Advocate Encounter  Prior Authorization for Ibrance   is no longer required after notification from [facility].

## 2024-06-12 ENCOUNTER — Ambulatory Visit: Payer: Self-pay

## 2024-06-12 NOTE — Telephone Encounter (Signed)
 FYI Only or Action Required?: Action required by provider: request for appointment.  Patient was last seen in primary care on 06/05/2024 by Caro Harlene POUR, NP.  Called Nurse Triage reporting Dizziness.  Symptoms began several months ago.  Interventions attempted: Nothing.  Symptoms are: unchanged.  Triage Disposition: See PCP When Office is Open (Within 3 Days)  Patient/caregiver understands and will follow disposition?: Yes

## 2024-06-12 NOTE — Telephone Encounter (Signed)
 Appointment scheduled for 06/14/2024.

## 2024-06-12 NOTE — Telephone Encounter (Signed)
 Reason for Disposition . [1] MILD dizziness (e.g., walking normally) AND [2] has NOT been evaluated by doctor (or NP/PA) for this  (Exception: Dizziness caused by heat exposure, sudden standing, or poor fluid intake.)  Answer Assessment - Initial Assessment Questions 1. DESCRIPTION: Describe your dizziness.     Building is moving left and right 2. LIGHTHEADED: Do you feel lightheaded? (e.g., somewhat faint, woozy, weak upon standing)     no 3. VERTIGO: Do you feel like either you or the room is spinning or tilting? (i.e., vertigo)     yes 4. SEVERITY: How bad is it?  Do you feel like you are going to faint? Can you stand and walk?     denies 5. ONSET:  When did the dizziness begin?     2-3 months 6. AGGRAVATING FACTORS: Does anything make it worse? (e.g., standing, change in head position)     Change in head position 7. HEART RATE: Can you tell me your heart rate? How many beats in 15 seconds?  (Note: Not all patients can do this.)       Na 8. CAUSE: What do you think is causing the dizziness? (e.g., decreased fluids or food, diarrhea, emotional distress, heat exposure, new medicine, sudden standing, vomiting; unknown)     Not sure 9. RECURRENT SYMPTOM: Have you had dizziness before? If Yes, ask: When was the last time? What happened that time?     denies 10. OTHER SYMPTOMS: Do you have any other symptoms? (e.g., fever, chest pain, vomiting, diarrhea, bleeding)       denies    Pt was seen last week but forgot to mention this to provider. Pt has been experiencing dizziness for 2-3 months. Bending over, moves head left to right,  and rising from lying causes this. No falls. No weakness. Pt requested to be seen Wednesday due to work.  Protocols used: Dizziness - Lightheadedness-A-AH

## 2024-06-14 ENCOUNTER — Ambulatory Visit (INDEPENDENT_AMBULATORY_CARE_PROVIDER_SITE_OTHER): Admitting: Adult Health

## 2024-06-14 ENCOUNTER — Encounter: Payer: Self-pay | Admitting: Adult Health

## 2024-06-14 VITALS — BP 124/64 | HR 82 | Temp 98.2°F | Ht 62.0 in | Wt 193.8 lb

## 2024-06-14 DIAGNOSIS — C50411 Malignant neoplasm of upper-outer quadrant of right female breast: Secondary | ICD-10-CM

## 2024-06-14 DIAGNOSIS — Z170 Estrogen receptor positive status [ER+]: Secondary | ICD-10-CM | POA: Diagnosis not present

## 2024-06-14 DIAGNOSIS — R42 Dizziness and giddiness: Secondary | ICD-10-CM

## 2024-06-14 DIAGNOSIS — N184 Chronic kidney disease, stage 4 (severe): Secondary | ICD-10-CM | POA: Diagnosis not present

## 2024-06-14 DIAGNOSIS — I129 Hypertensive chronic kidney disease with stage 1 through stage 4 chronic kidney disease, or unspecified chronic kidney disease: Secondary | ICD-10-CM | POA: Diagnosis not present

## 2024-06-14 DIAGNOSIS — H814 Vertigo of central origin: Secondary | ICD-10-CM | POA: Diagnosis not present

## 2024-06-14 DIAGNOSIS — N3281 Overactive bladder: Secondary | ICD-10-CM | POA: Diagnosis not present

## 2024-06-14 NOTE — Progress Notes (Signed)
 [facility] clinic  Provider:  Jereld Serum DNP  Code Status:  Full Code  Goals of Care:     04/05/2024    9:40 AM  Advanced Directives  Does Patient Have a Medical Advance Directive? No  Would patient like information on creating a medical advance directive? No - Patient declined     Chief Complaint  Patient presents with   Dizziness    Patient states she has dizziness for 3 months, only when bending over    Discussed the use of AI scribe software for clinical note transcription with the patient, who gave verbal consent to proceed.  HPI: Patient is a 68 y.o. female seen today for an acute visit for dizziness.  She has been experiencing dizziness for the past three months, primarily when changing positions such as bending down, standing up, or turning her head. She reports dizziness when changing positions, such as bending down, standing up, or turning her head, and that it resolves once she stands back up. No new medications have been introduced, and the dizziness is not associated with her blood pressure, which she monitors at home. BP today 124/64.  She has a history of breast cancer diagnosed nine years ago, with metastasis to T12 and L2 vertebrae. She is currently on Ibrance  75 mg for 21 days with a 7-day break for her breast cancer treatment. She follows up with her oncologist regularly and is scheduled for a follow-up in mid-August.  Her hypertension is managed with nifedipine  60 mg twice daily and labetalol  300 mg three times daily. She reports her blood pressure is usually routine at home, though she is unsure of exact numbers. She is also on Lipitor 20 mg daily for hyperlipidemia.  She has chronic kidney disease, stage 4, and is scheduled to see a kidney specialist next month. She reports drinking a lot of water. She has an overactive bladder for which she takes oxybutynin 5 mg daily. She has discontinued Jinteli and aspirin .  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 She reports losing weight recently and attributes it to her kidney issues and decreased appetite. She occasionally consumes alcohol with friends but does not smoke. She is scheduled to get new glasses next week.     Past Medical History:  Diagnosis Date   Anemia    past hx many yrs ago    Arthritis    lt knee   Breast cancer ([name])    Breast cancer of 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) 04/02/2016   Chronic kidney disease    CKD   Colon polyps    COPD (chronic obstructive pulmonary disease) ([name])    GERD (gastroesophageal reflux disease)    years ago- not current    Hyperlipidemia    Hypertension    Multiple sclerosis ([name])    Personal history of chemotherapy    Personal history of radiation therapy    Stroke ([name])    2013, mild cognitive deficits   Vision abnormalities     Past Surgical History:  Procedure Laterality Date   BREAST BIOPSY Left 03/31/2016   BREAST BIOPSY Right 07/24/2021   BREAST LUMPECTOMY Left 04/17/2016   BREAST LUMPECTOMY Right 09/02/2021   BREAST LUMPECTOMY WITH RADIOACTIVE SEED AND SENTINEL LYMPH NODE BIOPSY Right 09/02/2021   Procedure: RIGHT BREAST LUMPECTOMY WITH RADIOACTIVE SEED AND SENTINEL LYMPH NODE BIOPSY;  Surgeon: Vanderbilt Ned, MD;  Location: [facility];  Service: General;  Laterality: Right;   COLONOSCOPY     HYSTEROSCOPY WITH D & C N/A 12/19/2015   Procedure: DILATATION AND CURETTAGE /HYSTEROSCOPY;  Surgeon: Duwaine Blumenthal, DO;  Location: [facility];  Service: Gynecology;  Laterality: N/A;   POLYPECTOMY     PORTACATH PLACEMENT Right 10/16/2021   Procedure: INSERTION PORT-A-CATH;  Surgeon: Vanderbilt Ned, MD;  Location: [facility];  Service: General;  Laterality: Right;   RADIOACTIVE SEED GUIDED PARTIAL MASTECTOMY WITH AXILLARY SENTINEL LYMPH NODE BIOPSY Left 04/17/2016   Procedure: RADIOACTIVE SEED GUIDED PARTIAL  MASTECTOMY WITH AXILLARY SENTINEL LYMPH NODE BIOPSY;  Surgeon: Ned Vanderbilt, MD;  Location: [facility];  Service: General;  Laterality: Left;  RADIOACTIVE SEED GUIDED PARTIAL MASTECTOMY WITH AXILLARY SENTINEL LYMPH NODE BIOPSY    TUBAL LIGATION     VAGINAL DELIVERY     x3    No Known Allergies  Outpatient Encounter Medications as of 06/14/2024  Medication Sig   allopurinol  (ZYLOPRIM ) 100 MG tablet Take 0.5 tablets (50 mg total) by mouth every other day.   atorvastatin  (LIPITOR) 20 MG tablet Take 1 tablet (20 mg total) by mouth daily.   labetalol  (NORMODYNE ) 300 MG tablet TAKE 2 TABLETS THREE TIMES DAILY   letrozole  (FEMARA ) 2.5 MG tablet TAKE 1 TABLET EVERY DAY   NIFEdipine  (PROCARDIA  XL/NIFEDICAL XL) 60 MG 24 hr tablet Take 1 tablet (60 mg total) by mouth 2 (two) times daily.   oxybutynin (DITROPAN) 5 MG tablet Take 10 mg by mouth daily.   palbociclib  (IBRANCE ) 75 MG tablet Take 1 tablet (75 mg total) by mouth daily. Take for 21 days on, 7 days off, repeat every 28 days.   [DISCONTINUED] aspirin  EC 81 MG tablet Take 1 tablet (81 mg total) by mouth daily. (Patient not taking: Reported on 06/05/2024)   [DISCONTINUED] GEMTESA  75 MG TABS Take 1 tablet by mouth once daily   No facility-administered encounter medications on file as of 06/14/2024.    Review of Systems:  Review of Systems  Constitutional:  Negative for appetite change, chills, fatigue and fever.  HENT:  Negative for congestion, hearing loss, rhinorrhea and sore throat.   Eyes: Negative.   Respiratory:  Negative for cough, shortness of breath and wheezing.   Cardiovascular:  Negative for chest pain, palpitations and leg swelling.  Gastrointestinal:  Negative for abdominal pain, constipation, diarrhea, nausea and vomiting.  Genitourinary:  Negative for dysuria.  Musculoskeletal:  Negative for arthralgias, back pain and myalgias.  Skin:  Negative for color change, rash and wound.  Neurological:  Positive for  dizziness. Negative for weakness and headaches.  Psychiatric/Behavioral:  Negative for behavioral problems. The patient is not nervous/anxious.     Health Maintenance  Topic Date Due   Colonoscopy  01/14/2024   Medicare Annual Wellness (AWV)  05/27/2024   COVID-19 Vaccine (1) 11/16/2024 (Originally 08/03/1961)   Zoster Vaccines- Shingrix (1 of 2) 11/16/2024 (Originally 08/03/1975)   Pneumococcal Vaccine: 50+ Years (2 of 2 - PPSV23, PCV20, or PCV21) 06/05/2025 (Originally 06/13/2021)   INFLUENZA VACCINE  06/16/2024   MAMMOGRAM  02/22/2026   DEXA SCAN  04/21/2026   Hepatitis C Screening  Completed   Hepatitis B Vaccines  Aged Out   HPV VACCINES  Aged Out   Meningococcal B Vaccine  Aged Out   DTaP/Tdap/Td  Discontinued    Physical Exam: Vitals:   06/14/24 1040  BP: 124/64  Pulse: 82  Temp: 98.2 F (36.8 C)  SpO2: 99%  Weight:  193 lb 12.8 oz (87.9 kg)  Height: 5' 2 (1.575 m)   Body mass index is 35.45 kg/m. Physical Exam Constitutional:      Appearance: She is obese.  HENT:     Head: Normocephalic and atraumatic.     Nose: Nose normal.     Mouth/Throat:     Mouth: Mucous membranes are moist.  Eyes:     Conjunctiva/sclera: Conjunctivae normal.  Cardiovascular:     Rate and Rhythm: Normal rate and regular rhythm.  Pulmonary:     Effort: Pulmonary effort is normal.     Breath sounds: Normal breath sounds.  Abdominal:     General: Bowel sounds are normal.     Palpations: Abdomen is soft.  Musculoskeletal:        General: Normal range of motion.     Cervical back: Normal range of motion.  Skin:    General: Skin is warm and dry.  Neurological:     General: No focal deficit present.     Mental Status: She is alert and oriented to person, place, and time.  Psychiatric:        Mood and Affect: Mood normal.        Behavior: Behavior normal.        Thought Content: Thought content normal.        Judgment: Judgment normal.     Labs reviewed: Basic Metabolic  Panel: Recent Labs    12/15/23 1701 02/03/24 0844 04/05/24 0856  NA 136 143 142  K 3.5 4.0 3.3*  CL 103 107 107  CO2 22 26 25   GLUCOSE 104* 122* 147*  BUN 39* 23 31*  CREATININE 2.26* 2.21* 2.95*  CALCIUM  9.1 9.7 9.6   Liver Function Tests: Recent Labs    12/15/23 1701 02/03/24 0844 04/05/24 0856  AST 22 20 17   ALT 16 12 10   ALKPHOS 89 102 105  BILITOT 0.5 0.5 0.5  PROT 7.2 7.6 7.7  ALBUMIN 3.7 4.7 4.7   Recent Labs    07/20/23 1343  LIPASE 49   No results for input(s): AMMONIA in the last 8760 hours. CBC: Recent Labs    11/29/23 1335 12/15/23 1701 02/03/24 0844 04/05/24 0856  WBC 3.9 3.9* 3.2* 2.9*  NEUTROABS 2,527  --  2.3 1.9  HGB 10.3* 10.1* 10.6* 9.9*  HCT 29.6* 30.7* 31.0* 28.2*  MCV 101.0* 104.8* 101.3* 96.2  PLT 274 270 394 315   Lipid Panel: No results for input(s): CHOL, HDL, LDLCALC, TRIG, CHOLHDL, LDLDIRECT in the last 8760 hours. Lab Results  Component Value Date   HGBA1C 5.8 (H) 05/13/2020    Procedures since last visit: No results found.  Assessment/Plan  1. Vertigo (Primary) -  Dizziness with positional changes. Differential includes central causes. - Order CT head without contrast to evaluate for intracranial causes - CT HEAD WO CONTRAST ( ); Future - Basic metabolic panel - CBC with Differential/Platelets  2. Hypertension with Chronic Kidney Disease stage IV -  Hypertension managed with nifedipine  and labetalol . Home BP 150-160 mmHg. - Follow-up with cardiologist planned.  3. OAB (overactive bladder) -  Overactive bladder managed with oxybutynin. - Discontinue Gemteza as per her request.   4. Malignant neoplasm of upper-outer quadrant of right breast in female, estrogen receptor positive ([name]) -  Breast cancer with vertebral metastasis. - Follow-up with oncologist scheduled for mid-August  5. CKD (chronic kidney disease) stage 4, GFR 15-29 ml/min ([name]) -  Prefers conservative management. - Perform blood  work to monitor kidney  function and anemia. - Appointment with nephrologist scheduled.     Labs/tests ordered:   CBC and BMP   Return if symptoms worsen or fail to improve.  Lugene Beougher Medina-Vargas, NP

## 2024-06-15 LAB — CBC WITH DIFFERENTIAL/PLATELET
Absolute Lymphocytes: 622 {cells}/uL — ABNORMAL LOW (ref 850–3900)
Absolute Monocytes: 366 {cells}/uL (ref 200–950)
Basophils Absolute: 30 {cells}/uL (ref 0–200)
Basophils Relative: 0.8 %
Eosinophils Absolute: 19 {cells}/uL (ref 15–500)
Eosinophils Relative: 0.5 %
HCT: 28.8 % — ABNORMAL LOW (ref 35.0–45.0)
Hemoglobin: 9.6 g/dL — ABNORMAL LOW (ref 11.7–15.5)
MCH: 34.5 pg — ABNORMAL HIGH (ref 27.0–33.0)
MCHC: 33.3 g/dL (ref 32.0–36.0)
MCV: 103.6 fL — ABNORMAL HIGH (ref 80.0–100.0)
MPV: 9.4 fL (ref 7.5–12.5)
Monocytes Relative: 9.9 %
Neutro Abs: 2664 {cells}/uL (ref 1500–7800)
Neutrophils Relative %: 72 %
Platelets: 171 Thousand/uL (ref 140–400)
RBC: 2.78 Million/uL — ABNORMAL LOW (ref 3.80–5.10)
RDW: 14.4 % (ref 11.0–15.0)
Total Lymphocyte: 16.8 %
WBC: 3.7 Thousand/uL — ABNORMAL LOW (ref 3.8–10.8)

## 2024-06-15 LAB — BASIC METABOLIC PANEL WITH GFR
BUN/Creatinine Ratio: 18 (calc) (ref 6–22)
BUN: 29 mg/dL — ABNORMAL HIGH (ref 7–25)
CO2: 16 mmol/L — ABNORMAL LOW (ref 20–32)
Calcium: 9.3 mg/dL (ref 8.6–10.4)
Chloride: 112 mmol/L — ABNORMAL HIGH (ref 98–110)
Creat: 1.6 mg/dL — ABNORMAL HIGH (ref 0.50–1.05)
Glucose, Bld: 139 mg/dL — ABNORMAL HIGH (ref 65–99)
Potassium: 4.4 mmol/L (ref 3.5–5.3)
Sodium: 139 mmol/L (ref 135–146)
eGFR: 35 mL/min/1.73m2 — ABNORMAL LOW (ref >=60)

## 2024-06-20 ENCOUNTER — Ambulatory Visit: Payer: Self-pay | Admitting: Adult Health

## 2024-06-20 NOTE — Progress Notes (Signed)
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

## 2024-06-21 ENCOUNTER — Ambulatory Visit
Admission: RE | Admit: 2024-06-21 | Discharge: 2024-06-21 | Disposition: A | Discharge status: Home / Self Care | Source: Ambulatory Visit | Attending: Adult Health | Admitting: Adult Health

## 2024-06-21 DIAGNOSIS — R42 Dizziness and giddiness: Secondary | ICD-10-CM | POA: Diagnosis not present

## 2024-06-23 ENCOUNTER — Telehealth: Payer: Self-pay

## 2024-06-23 ENCOUNTER — Other Ambulatory Visit: Payer: Self-pay | Admitting: Adult Health

## 2024-06-23 DIAGNOSIS — R42 Dizziness and giddiness: Secondary | ICD-10-CM

## 2024-06-23 DIAGNOSIS — I7090 Unspecified atherosclerosis: Secondary | ICD-10-CM

## 2024-06-23 NOTE — Telephone Encounter (Signed)
 Outgoing call placed to [phone], Radiology reading room to request CT reading today, receptionist stated she will make a note on patients account to have results available today, patient aware.   Message forwarded to Monina to provide any additional recommendations, if any while we await for CT results

## 2024-06-23 NOTE — Telephone Encounter (Signed)
 Copied from CRM #[id number]. Topic: Clinical - Lab/Test Results >> Jun 23, 2024  8:16 AM Farrel B wrote: Reason for CRM: patient called from [phone], she stated she was calling to get the results of her GI CT due to the vertigo. I explained to the patient, the results had not been finalized as of yet. Patient as requested that someone call her back to explain to her what is going on because she is still experiencing the dizziness. I offered her to speak with the triage nurse she stated she just wanted her results at this time. I also advised her to keep an eye on her MyChart for the results as well. Patient asked how long does it normally take for test results I advised her with imaging anywhere 24-36 hours depending on the staff workload

## 2024-06-23 NOTE — Telephone Encounter (Signed)
 Called patient and discussed result with her. Referred patient to ENT and Vestibular rehab.

## 2024-06-23 NOTE — Progress Notes (Signed)
 Discussed result with patient, will refer for vestibular rehab and ENT consult for dizziness.

## 2024-06-26 ENCOUNTER — Encounter: Payer: Self-pay | Admitting: Internal Medicine

## 2024-06-27 ENCOUNTER — Other Ambulatory Visit (HOSPITAL_COMMUNITY): Payer: Self-pay

## 2024-06-27 ENCOUNTER — Other Ambulatory Visit: Payer: Self-pay

## 2024-06-27 ENCOUNTER — Encounter (INDEPENDENT_AMBULATORY_CARE_PROVIDER_SITE_OTHER): Payer: Self-pay

## 2024-06-27 NOTE — Progress Notes (Signed)
 Specialty Pharmacy Refill Coordination Note  MyChart Questionnaire Submission  Michele Page is a 68 y.o. female contacted today regarding refills of specialty medication(s) Ibrance .  Doses on hand: (Patient-Rptd) 0   Patient requested: (Patient-Rptd) Delivery   Delivery date: 07/03/24  Verified address: [address] [name] [address]  Medication will be filled on 06/30/24.

## 2024-06-30 ENCOUNTER — Other Ambulatory Visit: Payer: Self-pay

## 2024-07-10 ENCOUNTER — Inpatient Hospital Stay: Attending: Hematology and Oncology

## 2024-07-10 ENCOUNTER — Inpatient Hospital Stay (HOSPITAL_BASED_OUTPATIENT_CLINIC_OR_DEPARTMENT_OTHER): Admitting: Hematology and Oncology

## 2024-07-10 VITALS — BP 134/63 | HR 79 | Temp 97.8°F | Resp 16 | Wt 192.6 lb

## 2024-07-10 DIAGNOSIS — Z170 Estrogen receptor positive status [ER+]: Secondary | ICD-10-CM | POA: Diagnosis not present

## 2024-07-10 DIAGNOSIS — C50412 Malignant neoplasm of upper-outer quadrant of left female breast: Secondary | ICD-10-CM

## 2024-07-10 DIAGNOSIS — D72819 Decreased white blood cell count, unspecified: Secondary | ICD-10-CM | POA: Diagnosis not present

## 2024-07-10 DIAGNOSIS — Z79811 Long term (current) use of aromatase inhibitors: Secondary | ICD-10-CM | POA: Diagnosis not present

## 2024-07-10 DIAGNOSIS — C7951 Secondary malignant neoplasm of bone: Secondary | ICD-10-CM | POA: Diagnosis not present

## 2024-07-10 DIAGNOSIS — D649 Anemia, unspecified: Secondary | ICD-10-CM | POA: Diagnosis not present

## 2024-07-10 DIAGNOSIS — N189 Chronic kidney disease, unspecified: Secondary | ICD-10-CM | POA: Insufficient documentation

## 2024-07-10 LAB — CMP (CANCER CENTER ONLY)
ALT: 12 U/L (ref 0–44)
AST: 18 U/L (ref 15–41)
Albumin: 4.2 g/dL (ref 3.5–5.0)
Alkaline Phosphatase: 103 U/L (ref 38–126)
Anion gap: 8 (ref 5–15)
BUN: 25 mg/dL — ABNORMAL HIGH (ref 8–23)
CO2: 24 mmol/L (ref 22–32)
Calcium: 9 mg/dL (ref 8.9–10.3)
Chloride: 108 mmol/L (ref 98–111)
Creatinine: 2.1 mg/dL — ABNORMAL HIGH (ref 0.44–1.00)
GFR, Estimated: 25 mL/min — ABNORMAL LOW (ref >60)
Glucose, Bld: 104 mg/dL — ABNORMAL HIGH (ref 70–99)
Potassium: 3.5 mmol/L (ref 3.5–5.1)
Sodium: 140 mmol/L (ref 135–145)
Total Bilirubin: 0.3 mg/dL (ref 0.0–1.2)
Total Protein: 6.6 g/dL (ref 6.5–8.1)

## 2024-07-10 LAB — CBC WITH DIFFERENTIAL (CANCER CENTER ONLY)
Abs Immature Granulocytes: 0.02 K/uL (ref 0.00–0.07)
Basophils Absolute: 0 K/uL (ref 0.0–0.1)
Basophils Relative: 1 %
Eosinophils Absolute: 0 K/uL (ref 0.0–0.5)
Eosinophils Relative: 1 %
HCT: 27.2 % — ABNORMAL LOW (ref 36.0–46.0)
Hemoglobin: 9.4 g/dL — ABNORMAL LOW (ref 12.0–15.0)
Immature Granulocytes: 1 %
Lymphocytes Relative: 28 %
Lymphs Abs: 0.9 K/uL (ref 0.7–4.0)
MCH: 34.7 pg — ABNORMAL HIGH (ref 26.0–34.0)
MCHC: 34.6 g/dL (ref 30.0–36.0)
MCV: 100.4 fL — ABNORMAL HIGH (ref 80.0–100.0)
Monocytes Absolute: 0.4 K/uL (ref 0.1–1.0)
Monocytes Relative: 12 %
Neutro Abs: 1.8 K/uL (ref 1.7–7.7)
Neutrophils Relative %: 57 %
Platelet Count: 219 K/uL (ref 150–400)
RBC: 2.71 MIL/uL — ABNORMAL LOW (ref 3.87–5.11)
RDW: 13.7 % (ref 11.5–15.5)
WBC Count: 3.1 K/uL — ABNORMAL LOW (ref 4.0–10.5)
nRBC: 0 % (ref 0.0–0.2)

## 2024-07-10 NOTE — Assessment & Plan Note (Signed)
 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 systemic chemotherapy with CMF x6 cycles completed 01/30/2022 2. adjuvant radiation therapy completing 04/09/2022 3.  For the continued antiestrogen therapy (previously anastrozole  started 07/23/2016 switched to letrozole  2018 switched to tamoxifen  2018, switched back to anastrozole ) hair loss was the main problem, may consider exemestane --------------------------------------------------------------------------------------   Current treatment: Letrozole  with Ibrance .  Patient could not tolerate Verzinio Toxicities: Fatigue Leukopenia: ANC 1.9   Signatera:  06/12/2022: Positive (0.52) 09/02/2022: Positive (68.11) Mar 22, 2023: Positive (106.54) 07/06/2023: Positive (662) 09/13/2023: Positive (57.14) 03/07/2024: 109.06   CT CAP 07/10/2022: No metastatic disease.  Tiny pulmonary nodules nonspecific. CT CAP 04/02/2023: New peripherally sclerotic lesion superior endplate L2 suspicious for treated metastasis or Schmorl's node.  No other sites of metastatic disease identified CT CAP 07/06/2023: Subpectoral lymph node 7 mm (used to be 5 to 6 mm) ill-defined groundglass density right lower lobe bone metastases stable T11 L2 CT CAP 09/24/2023: Stable bone metastasis T11 L2 MRI neck: 10/26/2023: Left thyroid  nodule 2.5 cm unchanged, no findings in the neck 10/27/2023: Left leg  x-rays: Mild osteoarthritis 12/15/2023: CT abdomen: Stable T11 and L2 03/23/24: CT CAP: Stable T 11 and L2   Elevated creatinine: I discussed with her to follow-up with nephrology.  Encouraged her to drink more fluids. Labs, scans and follow-up in 3 months

## 2024-07-10 NOTE — Progress Notes (Signed)
 Patient Care Team: Caro Harlene POUR, NP as PCP - General (Geriatric Medicine) Court Dorn PARAS, MD as PCP - Cardiology (Cardiology) Odean Potts, MD as Consulting Physician (Oncology) Vanderbilt Ned, MD as Consulting Physician (General Surgery) Lucila Norleen LABOR, RPH-CPP as Pharmacist (Hematology and Oncology) Albertus, Gordy HERO, MD as Consulting Physician (Gastroenterology)  DIAGNOSIS:  Encounter Diagnosis  Name Primary?   Malignant neoplasm of upper-outer quadrant of left breast in female, estrogen receptor positive ([name]) Yes    SUMMARY OF ONCOLOGIC HISTORY: Oncology History  Breast cancer of 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)  03/31/2016 Initial Diagnosis   Screening detected left breast mass, 2 nodules, 1.9 x 1.6 x 0.8 cm= fat necrosis; 8 x 7 x 7 mm= grade 2 IDC ER 100%, PR 100%, HER-2 negative ratio 1.39, Ki-67 20%   04/17/2016 Surgery   Left lumpectomy ([name]): IDC grade 2, 1.3 cm, with DCIS, margins negative, 0/4 lymph nodes negative, T1 cN0 stage IA pathologic stage, ER 100%, PR 100%, HER-2 negative ratio 1.39, Ki-67 20% Oncotype DX score 20, 13% ROR, intermediate risk   04/17/2016 Oncotype testing   Recurrence score: 20; ROR 15% (intermediate risk)    05/27/2016 - 07/13/2016 Radiation Therapy   Adjuvant radiation therapy [facility]): Left breast treated with breath hold to 50.4 Gy in 28 fractions at 1.8 Gy/fraction.  Left breast boosted to 10 Gy in 5 fractions at 2 Gy/fraction   07/23/2016 -  Anti-estrogen oral therapy   Anastrozole  1 mg switched to letrozole  04/22/2017 due to hair loss, switch to tamoxifen  10/22/2017 due to hair loss from letrozole  as well   Malignant neoplasm of upper-outer quadrant of right breast in female, estrogen receptor positive ([name])  08/04/2021 Initial Diagnosis   Malignant neoplasm of upper-outer quadrant of right breast in female, estrogen receptor positive ([name])    Relapse/Recurrence   2.2 cm ILC ER 100%, PR 90%, Her 2 Neg, Ki 67: 25%,  Size 2.5 cm   08/04/2021 Cancer Staging   Staging form: Breast, AJCC 8th Edition - Clinical: Stage IB (cT2, cN0, cM0, G2, ER+, PR+, HER2-) - Signed by Odean Potts, MD on 08/04/2021 Stage prefix: Initial diagnosis Histologic grading system: 3 grade system   09/02/2021 Surgery   Right lumpectomy: Grade 2 ILC 3.1 cm, margins negative, LVI present, 4/6 lymph nodes macrometastases, 2 lymph nodes isolated tumor cells, ER 100%, PR 90%, Ki-67 25%, HER2 negative   10/17/2021 - 01/30/2022 Chemotherapy   Patient is on Treatment Plan : BREAST Adjuvant CMF IV q21d      Genetic Testing   Negative genetic testing. No pathogenic variants identified on the [employer] CustomNext+RNA panel. The report date is 03/04/2022.  The CustomNext-Cancer+RNAinsight panel offered by [employer] [facility] includes sequencing and rearrangement analysis for the following 47 genes:  APC, ATM, AXIN2, BARD1, BMPR1A, BRCA1, BRCA2, BRIP1, CDH1, CDK4, CDKN2A, CHEK2, DICER1, EPCAM, GREM1, HOXB13, MEN1, MLH1, MSH2, MSH3, MSH6, MUTYH, NBN, NF1, NF2, NTHL1, PALB2, PMS2, POLD1, POLE, PTEN, RAD51C, RAD51D, RECQL, RET, SDHA, SDHAF2, SDHB, SDHC, SDHD, SMAD4, SMARCA4, STK11, TP53, TSC1, TSC2, and VHL.  RNA data is routinely analyzed for use in variant interpretation for all genes.     CHIEF COMPLIANT:   HISTORY OF PRESENT ILLNESS: Discussed the use of AI scribe software for clinical note transcription with the patient, who gave verbal consent to proceed.  History of Present Illness Michele Page is a 68 year old female with chronic kidney disease and metastatic breast cancer who presents with concerns about urinary changes and a  funny taste in her mouth.  She observes foam-like bubbles in her urine, though no air bubbles are present. Her creatinine level has increased to 2.1 from 1.6, with a previous high of 2.9. She experiences a sour taste in her mouth, especially when eating. Discomfort in her right shoulder and neck improves with massage but  recurs. She consistently takes Ibrance  monthly without missing doses. Recent blood work shows a white blood cell count of 3.1 and hemoglobin level of 9.4.     ALLERGIES:  has no known allergies.  MEDICATIONS:  Current Outpatient Medications  Medication Sig Dispense Refill   allopurinol  (ZYLOPRIM ) 100 MG tablet Take 0.5 tablets (50 mg total) by mouth every other day. 45 tablet 0   atorvastatin  (LIPITOR) 20 MG tablet Take 1 tablet (20 mg total) by mouth daily. 90 tablet 3   labetalol  (NORMODYNE ) 300 MG tablet TAKE 2 TABLETS THREE TIMES DAILY 540 tablet 3   letrozole  (FEMARA ) 2.5 MG tablet TAKE 1 TABLET EVERY DAY 90 tablet 3   NIFEdipine  (PROCARDIA  XL/NIFEDICAL XL) 60 MG 24 hr tablet Take 1 tablet (60 mg total) by mouth 2 (two) times daily. 60 tablet 0   oxybutynin (DITROPAN) 5 MG tablet Take 10 mg by mouth daily.     palbociclib  (IBRANCE ) 75 MG tablet Take 1 tablet (75 mg total) by mouth daily. Take for 21 days on, 7 days off, repeat every 28 days. 21 tablet 3   No current facility-administered medications for this visit.    PHYSICAL EXAMINATION: ECOG PERFORMANCE STATUS: 1 - Symptomatic but completely ambulatory  Vitals:   07/10/24 1012  BP: 134/63  Pulse: 79  Resp: 16  Temp: 97.8 F (36.6 C)  SpO2: 100%   Filed Weights   07/10/24 1012  Weight: 192 lb 9.6 oz (87.4 kg)    Physical Exam   (exam performed in the presence of a chaperone)  LABORATORY DATA:  I have reviewed the data as listed    Latest Ref Rng & Units 07/10/2024    9:55 AM 06/14/2024    1:45 PM 04/05/2024    8:56 AM  CMP  Glucose 70 - 99 mg/dL 895  860  852   BUN 8 - 23 mg/dL 25  29  31    Creatinine 0.44 - 1.00 mg/dL 7.89  8.39  7.04   Sodium 135 - 145 mmol/L 140  139  142   Potassium 3.5 - 5.1 mmol/L 3.5  4.4  3.3   Chloride 98 - 111 mmol/L 108  112  107   CO2 22 - 32 mmol/L 24  16  25    Calcium  8.9 - 10.3 mg/dL 9.0  9.3  9.6   Total Protein 6.5 - 8.1 g/dL 6.6   7.7   Total Bilirubin 0.0 - 1.2 mg/dL  0.3   0.5   Alkaline Phos 38 - 126 U/L 103   105   AST 15 - 41 U/L 18   17   ALT 0 - 44 U/L 12   10     Lab Results  Component Value Date   WBC 3.1 (L) 07/10/2024   HGB 9.4 (L) 07/10/2024   HCT 27.2 (L) 07/10/2024   MCV 100.4 (H) 07/10/2024   PLT 219 07/10/2024   NEUTROABS 1.8 07/10/2024    ASSESSMENT & PLAN:  Breast cancer of 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) 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, 1.3 cm, with DCIS, margins negative, 0/4 lymph nodes negative, T1 cN0 stage IA pathologic stage,  ER 100%, PR 100%, HER-2 negative ratio 1.39, Ki-67 20% (Originally 2 nodules were detected on screening mammogram 1.9 cm= fat necrosis and 8 mm IDC) Oncotype DX score 20, 13% risk of recurrence, intermediate risk Adjuvant radiation therapy started 07/20/2017completed 07/13/2016     09/02/2021:Right lumpectomy: Grade 2 ILC 3.1 cm, margins negative, LVI present, 4/6 lymph nodes macrometastases, 2 lymph nodes isolated tumor cells, ER 100%, PR 90%, Ki-67 25%, HER2 negative   Treatment plan/summary: 1. systemic chemotherapy with CMF x6 cycles completed 01/30/2022 2. adjuvant radiation therapy completing 04/09/2022 3.  For the continued antiestrogen therapy (previously anastrozole  started 07/23/2016 switched to letrozole  2018 switched to tamoxifen  2018, switched back to anastrozole ) hair loss was the main problem, may consider exemestane --------------------------------------------------------------------------------------   Current treatment: Letrozole  with Ibrance .  Patient could not tolerate Verzinio Toxicities: Fatigue Leukopenia: ANC 1.9   Signatera:  06/12/2022: Positive (0.52) 09/02/2022: Positive (68.11) Mar 22, 2023: Positive (106.54) 07/06/2023: Positive (662) 09/13/2023: Positive (57.14) 03/07/2024: 109.06   CT CAP 07/10/2022: No metastatic disease.  Tiny pulmonary nodules nonspecific. CT CAP 04/02/2023: New peripherally sclerotic lesion superior endplate L2 suspicious  for treated metastasis or Schmorl's node.  No other sites of metastatic disease identified CT CAP 07/06/2023: Subpectoral lymph node 7 mm (used to be 5 to 6 mm) ill-defined groundglass density right lower lobe bone metastases stable T11 L2 CT CAP 09/24/2023: Stable bone metastasis T11 L2 MRI neck: 10/26/2023: Left thyroid  nodule 2.5 cm unchanged, no findings in the neck 10/27/2023: Left leg x-rays: Mild osteoarthritis 12/15/2023: CT abdomen: Stable T11 and L2 03/23/24: CT CAP: Stable T 11 and L2   Elevated creatinine: I discussed with her to follow-up with nephrology.  Encouraged her to drink more fluids.  She has an appointment coming up with nephrology.  Labs, scans and follow-up in 3 months ------------------------------------- Assessment and Plan Assessment & Plan Metastatic breast cancer, left upper-outer quadrant Currently on Palbociclib  with well-managed leukopenia. Scheduled for a whole body CT scan in three months to monitor disease status. - Order whole body CT scan in three months. - Perform blood work on the day of the scan. - Schedule follow-up visit one week after the scan to discuss results.  Anemia Mild anemia with hemoglobin level of 9.4, slightly decreased from previous levels. Not severe enough to require a blood transfusion.  Leukopenia White blood cell count is slightly low at 3.1, consistent with Palbociclib  effects. Count is stable.  Chronic kidney disease Kidney function worsened slightly with creatinine level of 2.1, up from 1.6, but improved from 2.9. Reports foamy urine and a funny taste in her mouth. Upcoming nephrology appointment for further evaluation. - Ensure she keeps appointment with nephrologist. - Consider ultrasound of kidneys if recommended by nephrologist.      No orders of the defined types were placed in this encounter.  The patient has a good understanding of the overall plan. she agrees with it. she will call with any problems that may  develop before the next visit here. Total time spent: 30 mins including face to face time and time spent for planning, charting and co-ordination of care   Naomi MARLA Chad, MD 07/10/24

## 2024-07-11 LAB — CANCER ANTIGEN 27.29: CA 27.29: 32.3 U/mL (ref 0.0–38.6)

## 2024-07-21 ENCOUNTER — Telehealth: Payer: Self-pay | Admitting: *Deleted

## 2024-07-21 DIAGNOSIS — I10 Essential (primary) hypertension: Secondary | ICD-10-CM

## 2024-07-21 MED ORDER — LABETALOL HCL 300 MG PO TABS: 600.0000 mg | ORAL_TABLET | Freq: Three times a day (TID) | ORAL | 0 refills | Status: DC

## 2024-07-21 NOTE — Telephone Encounter (Signed)
 Copied from CRM [id number]. Topic: Clinical - Medication Question >> Jul 21, 2024  8:08 AM Merlynn A wrote: Reason for CRM: Patient called in regarding medication labetalol  (NORMODYNE ) 300 MG tablet. Patient is asking for a few day supply until medication comes. Please contact patient if unable to provide few day supply of medication. Patient can be reached at [phone].

## 2024-07-21 NOTE — Telephone Encounter (Signed)
 Patient is waiting for her Labetalol  supply to come from the mail order and it is running behind and she has no more medication. Requesting a supply to be sent to her local pharmacy. Confirmed dosage and directions and Sent as requested.

## 2024-07-25 ENCOUNTER — Ambulatory Visit
Admission: EM | Admit: 2024-07-25 | Discharge: 2024-07-25 | Disposition: A | Discharge status: Home / Self Care | Attending: Physician Assistant | Admitting: Physician Assistant

## 2024-07-25 ENCOUNTER — Ambulatory Visit: Payer: Self-pay

## 2024-07-25 ENCOUNTER — Encounter (HOSPITAL_COMMUNITY): Payer: Self-pay

## 2024-07-25 ENCOUNTER — Other Ambulatory Visit: Payer: Self-pay

## 2024-07-25 ENCOUNTER — Emergency Department (HOSPITAL_COMMUNITY)

## 2024-07-25 ENCOUNTER — Observation Stay (HOSPITAL_COMMUNITY)
Admission: EM | Admit: 2024-07-25 | Discharge: 2024-07-26 | Disposition: A | Discharge status: Home / Self Care | Source: Ambulatory Visit | Attending: Internal Medicine | Admitting: Internal Medicine

## 2024-07-25 DIAGNOSIS — I129 Hypertensive chronic kidney disease with stage 1 through stage 4 chronic kidney disease, or unspecified chronic kidney disease: Secondary | ICD-10-CM | POA: Insufficient documentation

## 2024-07-25 DIAGNOSIS — D0590 Unspecified type of carcinoma in situ of unspecified breast: Secondary | ICD-10-CM | POA: Insufficient documentation

## 2024-07-25 DIAGNOSIS — R002 Palpitations: Secondary | ICD-10-CM

## 2024-07-25 DIAGNOSIS — K429 Umbilical hernia without obstruction or gangrene: Secondary | ICD-10-CM | POA: Diagnosis not present

## 2024-07-25 DIAGNOSIS — R1030 Lower abdominal pain, unspecified: Secondary | ICD-10-CM

## 2024-07-25 DIAGNOSIS — Z170 Estrogen receptor positive status [ER+]: Secondary | ICD-10-CM

## 2024-07-25 DIAGNOSIS — I10 Essential (primary) hypertension: Secondary | ICD-10-CM | POA: Diagnosis not present

## 2024-07-25 DIAGNOSIS — R1031 Right lower quadrant pain: Secondary | ICD-10-CM

## 2024-07-25 DIAGNOSIS — R7989 Other specified abnormal findings of blood chemistry: Secondary | ICD-10-CM | POA: Diagnosis not present

## 2024-07-25 DIAGNOSIS — C50411 Malignant neoplasm of upper-outer quadrant of right female breast: Secondary | ICD-10-CM

## 2024-07-25 DIAGNOSIS — I700 Atherosclerosis of aorta: Secondary | ICD-10-CM | POA: Insufficient documentation

## 2024-07-25 DIAGNOSIS — Z7982 Long term (current) use of aspirin: Secondary | ICD-10-CM | POA: Diagnosis not present

## 2024-07-25 DIAGNOSIS — N184 Chronic kidney disease, stage 4 (severe): Secondary | ICD-10-CM | POA: Diagnosis not present

## 2024-07-25 DIAGNOSIS — Z8673 Personal history of transient ischemic attack (TIA), and cerebral infarction without residual deficits: Secondary | ICD-10-CM | POA: Diagnosis not present

## 2024-07-25 DIAGNOSIS — D649 Anemia, unspecified: Secondary | ICD-10-CM | POA: Insufficient documentation

## 2024-07-25 DIAGNOSIS — R079 Chest pain, unspecified: Principal | ICD-10-CM | POA: Diagnosis present

## 2024-07-25 DIAGNOSIS — R109 Unspecified abdominal pain: Secondary | ICD-10-CM | POA: Diagnosis not present

## 2024-07-25 DIAGNOSIS — R0789 Other chest pain: Secondary | ICD-10-CM | POA: Diagnosis not present

## 2024-07-25 DIAGNOSIS — D539 Nutritional anemia, unspecified: Secondary | ICD-10-CM | POA: Diagnosis not present

## 2024-07-25 DIAGNOSIS — R0989 Other specified symptoms and signs involving the circulatory and respiratory systems: Secondary | ICD-10-CM | POA: Diagnosis not present

## 2024-07-25 DIAGNOSIS — K449 Diaphragmatic hernia without obstruction or gangrene: Secondary | ICD-10-CM | POA: Diagnosis not present

## 2024-07-25 DIAGNOSIS — I6930 Unspecified sequelae of cerebral infarction: Secondary | ICD-10-CM

## 2024-07-25 DIAGNOSIS — D72819 Decreased white blood cell count, unspecified: Secondary | ICD-10-CM | POA: Diagnosis not present

## 2024-07-25 DIAGNOSIS — K5730 Diverticulosis of large intestine without perforation or abscess without bleeding: Secondary | ICD-10-CM | POA: Diagnosis not present

## 2024-07-25 LAB — BASIC METABOLIC PANEL WITH GFR
Anion gap: 16 — ABNORMAL HIGH (ref 5–15)
BUN: 23 mg/dL (ref 8–23)
CO2: 21 mmol/L — ABNORMAL LOW (ref 22–32)
Calcium: 9.9 mg/dL (ref 8.9–10.3)
Chloride: 106 mmol/L (ref 98–111)
Creatinine, Ser: 2.2 mg/dL — ABNORMAL HIGH (ref 0.44–1.00)
GFR, Estimated: 24 mL/min — ABNORMAL LOW (ref >60)
Glucose, Bld: 109 mg/dL — ABNORMAL HIGH (ref 70–99)
Potassium: 3.8 mmol/L (ref 3.5–5.1)
Sodium: 143 mmol/L (ref 135–145)

## 2024-07-25 LAB — LIPASE, BLOOD: Lipase: 22 U/L (ref 11–51)

## 2024-07-25 LAB — D-DIMER, QUANTITATIVE: D-Dimer, Quant: 1.29 ug{FEU}/mL — ABNORMAL HIGH (ref 0.00–0.50)

## 2024-07-25 LAB — CBC
HCT: 30.1 % — ABNORMAL LOW (ref 36.0–46.0)
Hemoglobin: 9.8 g/dL — ABNORMAL LOW (ref 12.0–15.0)
MCH: 33.4 pg (ref 26.0–34.0)
MCHC: 32.6 g/dL (ref 30.0–36.0)
MCV: 102.7 fL — ABNORMAL HIGH (ref 80.0–100.0)
Platelets: 417 K/uL — ABNORMAL HIGH (ref 150–400)
RBC: 2.93 MIL/uL — ABNORMAL LOW (ref 3.87–5.11)
RDW: 13.4 % (ref 11.5–15.5)
WBC: 2.9 K/uL — ABNORMAL LOW (ref 4.0–10.5)
nRBC: 0 % (ref 0.0–0.2)

## 2024-07-25 LAB — TROPONIN T, HIGH SENSITIVITY
Troponin T High Sensitivity: 43 ng/L — ABNORMAL HIGH (ref 0–19)
Troponin T High Sensitivity: 38 ng/L — ABNORMAL HIGH (ref 0–19)

## 2024-07-25 LAB — HEPATIC FUNCTION PANEL
ALT: 12 U/L (ref 0–44)
AST: 20 U/L (ref 15–41)
Albumin: 4.3 g/dL (ref 3.5–5.0)
Alkaline Phosphatase: 120 U/L (ref 38–126)
Bilirubin, Direct: 0.2 mg/dL (ref 0.0–0.2)
Indirect Bilirubin: 0.3 mg/dL (ref 0.3–0.9)
Total Bilirubin: 0.5 mg/dL (ref 0.0–1.2)
Total Protein: 7.1 g/dL (ref 6.5–8.1)

## 2024-07-25 MED ORDER — SODIUM CHLORIDE 0.9% FLUSH
3.0000 mL | Freq: Two times a day (BID) | INTRAVENOUS | Status: DC
  Administered 2024-07-25 – 2024-07-26 (×2): 3 mL via INTRAVENOUS

## 2024-07-25 MED ORDER — HYDROMORPHONE HCL 1 MG/ML IJ SOLN: 0.5000 mg | INTRAMUSCULAR | Status: DC | PRN

## 2024-07-25 MED ORDER — ACETAMINOPHEN 325 MG PO TABS: 650.0000 mg | ORAL_TABLET | Freq: Four times a day (QID) | ORAL | Status: DC | PRN

## 2024-07-25 MED ORDER — HEPARIN SODIUM (PORCINE) 5000 UNIT/ML IJ SOLN
5000.0000 [IU] | Freq: Three times a day (TID) | INTRAMUSCULAR | Status: DC
  Administered 2024-07-25 – 2024-07-26 (×3): 5000 [IU] via SUBCUTANEOUS
  Filled 2024-07-25 (×3): qty 1

## 2024-07-25 MED ORDER — LABETALOL HCL 100 MG PO TABS
600.0000 mg | ORAL_TABLET | Freq: Three times a day (TID) | ORAL | Status: DC
  Administered 2024-07-26 (×2): 600 mg via ORAL
  Filled 2024-07-25 (×3): qty 6

## 2024-07-25 MED ORDER — ASPIRIN 81 MG PO TBEC
81.0000 mg | DELAYED_RELEASE_TABLET | Freq: Every day | ORAL | Status: DC
  Administered 2024-07-26: 81 mg via ORAL
  Filled 2024-07-25: qty 1

## 2024-07-25 MED ORDER — OXYCODONE HCL 5 MG PO TABS: 5.0000 mg | ORAL_TABLET | ORAL | Status: DC | PRN

## 2024-07-25 MED ORDER — ALLOPURINOL 100 MG PO TABS: 50.0000 mg | ORAL_TABLET | ORAL | Status: DC

## 2024-07-25 MED ORDER — SENNA 8.6 MG PO TABS: 1.0000 | ORAL_TABLET | Freq: Every day | ORAL | Status: DC | PRN

## 2024-07-25 MED ORDER — ONDANSETRON HCL 4 MG PO TABS: 4.0000 mg | ORAL_TABLET | Freq: Four times a day (QID) | ORAL | Status: DC | PRN

## 2024-07-25 MED ORDER — IOHEXOL 9 MG/ML PO SOLN
500.0000 mL | ORAL | Status: AC
Start: 2024-07-25 — End: 2024-07-25
  Administered 2024-07-25 (×2): 500 mL via ORAL

## 2024-07-25 MED ORDER — ACETAMINOPHEN 650 MG RE SUPP: 650.0000 mg | Freq: Four times a day (QID) | RECTAL | Status: DC | PRN

## 2024-07-25 MED ORDER — ATORVASTATIN CALCIUM 20 MG PO TABS
20.0000 mg | ORAL_TABLET | Freq: Every day | ORAL | Status: DC
  Administered 2024-07-26: 20 mg via ORAL
  Filled 2024-07-25: qty 1

## 2024-07-25 MED ORDER — OXYBUTYNIN CHLORIDE 5 MG PO TABS
10.0000 mg | ORAL_TABLET | Freq: Every day | ORAL | Status: DC
Start: 2024-07-26 — End: 2024-07-26
  Administered 2024-07-26: 10 mg via ORAL
  Filled 2024-07-25: qty 2

## 2024-07-25 MED ORDER — ONDANSETRON HCL 4 MG/2ML IJ SOLN: 4.0000 mg | Freq: Four times a day (QID) | INTRAMUSCULAR | Status: DC | PRN

## 2024-07-25 MED ORDER — SODIUM CHLORIDE 0.9 % IV BOLUS
500.0000 mL | Freq: Once | INTRAVENOUS | Status: AC
  Administered 2024-07-25: 500 mL via INTRAVENOUS

## 2024-07-25 MED ORDER — NIFEDIPINE ER OSMOTIC RELEASE 60 MG PO TB24
60.0000 mg | ORAL_TABLET | Freq: Two times a day (BID) | ORAL | Status: DC
  Administered 2024-07-26: 60 mg via ORAL
  Filled 2024-07-25 (×3): qty 1

## 2024-07-25 NOTE — Telephone Encounter (Signed)
 Triage call dropped mid assessment with patient, 1st attempt to call patient back and no answer. Left voicemail. Patient needs further assessment on heart palpitations, scheduling/disposition, and care advice.           Copied from CRM [id number]. Topic: Clinical - Red Word Triage >> Jul 25, 2024  8:04 AM Laurier C wrote: Red Word that prompted transfer to Nurse Triage: Patient states she has been having pain on her right side for the last 2-3 weeks. Patient states the pain is making her heart have palpitations. Patient would rate the pain level between a 6 & 7. Reason for Disposition  [1] MODERATE pain (e.g., interferes with normal activities) AND [2] pain comes and goes (cramps) AND [3] present > 24 hours  (Exception: Pain with Vomiting or Diarrhea - see that Guideline.)  Answer Assessment - Initial Assessment Questions 1. LOCATION: Where does it hurt?      RLQ.  2. RADIATION: Does the pain shoot anywhere else? (e.g., chest, back)     No  3. ONSET: When did the pain begin? (e.g., minutes, hours or days ago)      2-3 weeks. Random, she states it doesn't correlate with any activities.  4. SUDDEN: Gradual or sudden onset?     Sudden.  5. PATTERN Does the pain come and go, or is it constant?     Comes and goes, sometimes it will stay.  6. SEVERITY: How bad is the pain?  (e.g., Scale 1-10; mild, moderate, or severe)     6-7/10. No medication taken for symptoms.  7. RECURRENT SYMPTOM: Have you ever had this type of stomach pain before? If Yes, ask: When was the last time? and What happened that time?      No.  8. CAUSE: What do you think is causing the stomach pain? (e.g., gallstones, recent abdominal surgery)     Unsure.  9. RELIEVING/AGGRAVATING FACTORS: What makes it better or worse? (e.g., antacids, bending or twisting motion, bowel movement)     No.  10. OTHER SYMPTOMS: Do you have any other symptoms? (e.g., back pain, diarrhea, fever, urination  pain, vomiting)       Denies back pain, nausea, vomiting, diarrhea, urination pain, fever, chest pain, SOB. Last BM yesterday. Denies any constipation or blood in stool. Patient states she has her appendix still, denies pain being severe to the point of holding abdomen or walking bent over. Heart palpitations/flutter sensation in chest accompanies the abdominal pain.   11. PREGNANCY: Is there any chance you are pregnant? When was your last menstrual period?       N/A.  Answer Assessment - Initial Assessment Questions 1. DESCRIPTION: Please describe your heart rate or heartbeat that you are having (e.g., fast/slow, regular/irregular, skipped or extra beats, palpitations)     Heart flutter, palpitations, feels like heart is beating hard.  2. ONSET: When did it start? (e.g., minutes, hours, days)      2-3 weeks.  3. DURATION: How long does it last (e.g., seconds, minutes, hours)     10 minutes.  4. PATTERN Does it come and go, or has it been constant since it started?  Does it get worse with exertion?   Are you feeling it now?     Comes and goes, comes on with the abdominal pain. No present now.  5. TAP: Using your hand, can you tap out what you are feeling on a chair or table in front of you, so that I can hear? Note: Not  all patients can do this.       N/A.  6. HEART RATE: Can you tell me your heart rate? How many beats in 15 seconds?  Note: Not all patients can do this.       BP 130/74  7. RECURRENT SYMPTOM: Have you ever had this before? If Yes, ask: When was the last time? and What happened that time?      *No Answer*  8. CAUSE: What do you think is causing the palpitations?     *No Answer*  9. CARDIAC HISTORY: Do you have any history of heart disease? (e.g., heart attack, angina, bypass surgery, angioplasty, arrhythmia)      *No Answer*  10. OTHER SYMPTOMS: Do you have any other symptoms? (e.g., dizziness, chest pain, sweating, difficulty  breathing)       *No Answer*  11. PREGNANCY: Is there any chance you are pregnant? When was your last menstrual period?       N/A.  Protocols used: Abdominal Pain - Female-A-AH, Heart Rate and Heartbeat Questions-A-AH

## 2024-07-25 NOTE — H&P (Signed)
 History and Physical    Michele Page FMW:[id number] DOB: [birth year] DOA: 07/25/2024  PCP: Caro Harlene POUR, NP   Patient coming from: Home   Chief Complaint: RLQ pain, chest pain, palpitations   HPI: Michele Page is a 68 y.o. female with medical history significant for hypertension, hyperlipidemia, history of CVA with residual deficits, CKD stage IV, and breast cancer who presents with right lower quadrant abdominal pain, chest pain, and palpitations.  Patient reports that she had been experiencing right lower quadrant pain which has since resolved.  She has also been concerned for intermittent palpitations and left-sided chest pain over the past 3 weeks.  Pain is described as sharp, localized to the left chest, occurs several times a day, resolves within 15 to 20 minutes, and she is unable to identify any alleviating or exacerbating factors.  She denies any associated shortness of breath, fever, chills, hemoptysis, or leg pain.  ED Course: Upon arrival to the ED, patient is found to be afebrile and saturating well on room air with normal HR and stable BP.  Labs are most notable for creatinine 2.20, WBC 2900, hemoglobin 9.8, D-dimer 1.29, and troponin 43.  There are no acute findings on chest x-ray or CT of the abdomen and pelvis.  Patient was given 500 mL of NS in the ED and VQ scan was ordered.  Review of Systems:  All other systems reviewed and apart from HPI, are negative.  Past Medical History:  Diagnosis Date   Anemia    past hx many yrs ago    Arthritis    lt knee   Breast cancer ([name])    Breast cancer of 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) 04/02/2016   Chronic kidney disease    CKD   Colon polyps    COPD (chronic obstructive pulmonary disease) ([name])    GERD (gastroesophageal reflux disease)    years ago- not current    Hyperlipidemia    Hypertension    Multiple sclerosis ([name])    Personal history of chemotherapy    Personal history of radiation therapy     Stroke ([name])    2013, mild cognitive deficits   Vision abnormalities     Past Surgical History:  Procedure Laterality Date   BREAST BIOPSY Left 03/31/2016   BREAST BIOPSY Right 07/24/2021   BREAST LUMPECTOMY Left 04/17/2016   BREAST LUMPECTOMY Right 09/02/2021   BREAST LUMPECTOMY WITH RADIOACTIVE SEED AND SENTINEL LYMPH NODE BIOPSY Right 09/02/2021   Procedure: RIGHT BREAST LUMPECTOMY WITH RADIOACTIVE SEED AND SENTINEL LYMPH NODE BIOPSY;  Surgeon: Vanderbilt Ned, MD;  Location: [facility];  Service: General;  Laterality: Right;   COLONOSCOPY     HYSTEROSCOPY WITH D & C N/A 12/19/2015   Procedure: DILATATION AND CURETTAGE /HYSTEROSCOPY;  Surgeon: Duwaine Blumenthal, DO;  Location: [facility];  Service: Gynecology;  Laterality: N/A;   POLYPECTOMY     PORTACATH PLACEMENT Right 10/16/2021   Procedure: INSERTION PORT-A-CATH;  Surgeon: Vanderbilt Ned, MD;  Location: [facility];  Service: General;  Laterality: Right;   RADIOACTIVE SEED GUIDED PARTIAL MASTECTOMY WITH AXILLARY SENTINEL LYMPH NODE BIOPSY Left 04/17/2016   Procedure: RADIOACTIVE SEED GUIDED PARTIAL MASTECTOMY WITH AXILLARY SENTINEL LYMPH NODE BIOPSY;  Surgeon: Ned Vanderbilt, MD;  Location: [facility];  Service: General;  Laterality: Left;  RADIOACTIVE SEED GUIDED PARTIAL MASTECTOMY WITH AXILLARY SENTINEL LYMPH NODE BIOPSY    TUBAL LIGATION     VAGINAL DELIVERY     x3  Social History:   reports that she quit smoking about 19 years ago. Her smoking use included cigarettes. She started smoking about 44 years ago. She has a 12.5 pack-year smoking history. She has never used smokeless tobacco. She reports current alcohol use. She reports that she does not use drugs.  No Known Allergies  Family History  Problem Relation Age of Onset   Hypertension Mother    Stroke Mother    Stroke Sister    Alcohol abuse Brother    HIV/AIDS Brother    Breast cancer Cousin        dx 74s   Colon cancer Neg  Hx    Colon polyps Neg Hx    Esophageal cancer Neg Hx    Rectal cancer Neg Hx    Stomach cancer Neg Hx      Prior to Admission medications   Medication Sig Start Date End Date Taking? Authorizing Provider  allopurinol  (ZYLOPRIM ) 100 MG tablet Take 0.5 tablets (50 mg total) by mouth every other day. 05/25/24   Caro Harlene POUR, NP  atorvastatin  (LIPITOR) 20 MG tablet Take 1 tablet (20 mg total) by mouth daily. 08/16/23   Caro Harlene POUR, NP  labetalol  (NORMODYNE ) 300 MG tablet Take 2 tablets (600 mg total) by mouth 3 (three) times daily. 07/21/24   Eubanks, Jessica K, NP  letrozole  (FEMARA ) 2.5 MG tablet TAKE 1 TABLET EVERY DAY 08/16/23   Gudena, Vinay, MD  NIFEdipine  (PROCARDIA  XL/NIFEDICAL XL) 60 MG 24 hr tablet Take 1 tablet (60 mg total) by mouth 2 (two) times daily. 12/14/23   Caro Harlene POUR, NP  oxybutynin  (DITROPAN ) 5 MG tablet Take 10 mg by mouth daily. 05/29/24   [provider]  palbociclib  (IBRANCE ) 75 MG tablet Take 1 tablet (75 mg total) by mouth daily. Take for 21 days on, 7 days off, repeat every 28 days. 06/08/24   Odean Potts, MD    Physical Exam: Vitals:   07/25/24 1132 07/25/24 1541 07/25/24 1954  BP: 125/66 (!) 128/99 135/87  Pulse: 78 67 82  Resp: (!) 24 18 17   Temp: 98.3 F (36.8 C) 98 F (36.7 C) 97.7 F (36.5 C)  TempSrc: Oral Oral Oral  SpO2: 96% 98% 96%  Weight: 85.7 kg    Height: 5' 2 (1.575 m)      Constitutional: NAD, no pallor or diaphoresis  Eyes: PERTLA, lids and conjunctivae normal ENMT: Mucous membranes are moist. Posterior pharynx clear of any exudate or lesions.   Neck: supple, no masses  Respiratory: no wheezing, no crackles. No accessory muscle use.  Cardiovascular: S1 & S2 heard, regular rate and rhythm. Trace ankle edema.   Abdomen: No tenderness, soft. Bowel sounds active.  Musculoskeletal: no clubbing / cyanosis. No joint deformity upper and lower extremities.   Skin: no significant rashes, lesions, ulcers. Warm, dry,  well-perfused. Neurologic: CN 2-12 grossly intact. Moving all extremities. Alert and oriented.  Psychiatric: Calm. Cooperative.    Labs and Imaging on Admission: I have personally reviewed following labs and imaging studies  CBC: Recent Labs  Lab 07/25/24 1143  WBC 2.9*  HGB 9.8*  HCT 30.1*  MCV 102.7*  PLT 417*   Basic Metabolic Panel: Recent Labs  Lab 07/25/24 1143  NA 143  K 3.8  CL 106  CO2 21*  GLUCOSE 109*  BUN 23  CREATININE 2.20*  CALCIUM  9.9   GFR: Estimated Creatinine Clearance: 24.8 mL/min (A) (by C-G formula based on SCr of 2.2 mg/dL (H)). Liver Function Tests:  Recent Labs  Lab 07/25/24 1509  AST 20  ALT 12  ALKPHOS 120  BILITOT 0.5  PROT 7.1  ALBUMIN  4.3   Recent Labs  Lab 07/25/24 1143  LIPASE 22   No results for input(s): AMMONIA in the last 168 hours. Coagulation Profile: No results for input(s): INR, PROTIME in the last 168 hours. Cardiac Enzymes: No results for input(s): CKTOTAL, CKMB, CKMBINDEX, TROPONINI in the last 168 hours. BNP (last 3 results) No results for input(s): PROBNP in the last 8760 hours. HbA1C: No results for input(s): HGBA1C in the last 72 hours. CBG: No results for input(s): GLUCAP in the last 168 hours. Lipid Profile: No results for input(s): CHOL, HDL, LDLCALC, TRIG, CHOLHDL, LDLDIRECT in the last 72 hours. Thyroid  Function Tests: No results for input(s): TSH, T4TOTAL, FREET4, T3FREE, THYROIDAB in the last 72 hours. Anemia Panel: No results for input(s): VITAMINB12, FOLATE, FERRITIN, TIBC, IRON, RETICCTPCT in the last 72 hours. Urine analysis:    Component Value Date/Time   COLORURINE YELLOW 05/24/2024 0830   APPEARANCEUR HAZY (A) 05/24/2024 0830   APPEARANCEUR Clear 01/23/2015 1014   LABSPEC 1.018 05/24/2024 0830   PHURINE 5.0 05/24/2024 0830   GLUCOSEU NEGATIVE 05/24/2024 0830   HGBUR NEGATIVE 05/24/2024 0830   BILIRUBINUR NEGATIVE 05/24/2024 0830    BILIRUBINUR Negative 01/23/2015 1014   KETONESUR 5 (A) 05/24/2024 0830   PROTEINUR 30 (A) 05/24/2024 0830   UROBILINOGEN 0.2 11/05/2014 1248   NITRITE NEGATIVE 05/24/2024 0830   LEUKOCYTESUR MODERATE (A) 05/24/2024 0830   Sepsis Labs: @LABRCNTIP (procalcitonin:4,lacticidven:4) )No results found for this or any previous visit (from the past 240 hours).   Radiological Exams on Admission: CT ABDOMEN PELVIS WO CONTRAST Result Date: 07/25/2024 CLINICAL DATA:  Bilateral lower quadrant pain EXAM: CT ABDOMEN AND PELVIS WITHOUT CONTRAST TECHNIQUE: Multidetector CT imaging of the abdomen and pelvis was performed following the standard protocol without IV contrast. RADIATION DOSE REDUCTION: This exam was performed according to the departmental dose-optimization program which includes automated exposure control, adjustment of the mA and/or kV according to patient size and/or use of iterative reconstruction technique. COMPARISON:  CT 03/21/2024, 12/15/2023 FINDINGS: Lower chest: Lung bases demonstrate no acute airspace disease. Trace pericardial effusion. Small hiatal hernia Hepatobiliary: No focal liver abnormality is seen. No gallstones, gallbladder wall thickening, or biliary dilatation. Pancreas: Unremarkable. No pancreatic ductal dilatation or surrounding inflammatory changes. Spleen: Normal in size without focal abnormality. Adrenals/Urinary Tract: Adrenal glands are stable in appearance. Kidneys show no hydronephrosis. Thickened appearing right adrenal gland. The bladder is unremarkable Stomach/Bowel: Stomach within normal limits. No dilated small bowel. No acute bowel wall thickening. Diverticular disease of the left colon. Negative appendix Vascular/Lymphatic: Aortic atherosclerosis. No enlarged abdominal or pelvic lymph nodes. Reproductive: Uterus and bilateral adnexa are unremarkable. Other: Negative for pelvic effusion or free air. Small fat containing umbilical hernia Musculoskeletal: No acute osseous  abnormality. Faint sclerotic lesions at T12, L2 and S1. IMPRESSION: 1. No CT evidence for acute intra-abdominal or pelvic abnormality. 2. Diverticular disease of the left colon without acute inflammatory process. 3. Aortic atherosclerosis. 4. Similar faint sclerotic lesions at T12, L2 and S1 corresponding to history of osseous metastatic disease Aortic Atherosclerosis (ICD10-I70.0). Electronically Signed   By: Luke Bun M.D.   On: 07/25/2024 19:38   DG Chest Port 1 View Result Date: 07/25/2024 CLINICAL DATA:  [id number] Surgery, elective [phone]. EXAM: PORTABLE CHEST 1 VIEW COMPARISON:  05/03/2023. FINDINGS: Low lung volume. Bilateral lung fields are clear. Bilateral costophrenic angles are clear. Stable cardio-mediastinal silhouette. No  acute osseous abnormalities. The soft tissues are within normal limits. IMPRESSION: No active disease. Electronically Signed   By: Ree Molt M.D.   On: 07/25/2024 14:18    EKG: Independently reviewed. Sinus rhythm, PACs.   Assessment/Plan   1. Chest pain; palpitations  - Presents with 3 weeks of intermittent chest pain and occasional palpitations  - No acute ischemic features on EKG; no acute CXR findings; troponin is mildly elevated and decreasing; D-dimer is 1.29 - Continue cardiac monitoring, check echocardiogram and V/Q scan    2. Breast cancer  - Currently on letrozole  and Ibrance  under the care of Dr. Odean    3. RLQ abdominal pain  - Now resolved per patient  - No acute findings on CT   4. CKD IV  - SCr up slightly from two weeks ago  - Renally-dose medications    5. Hx of CVA  - Lipitor, ASA    6. Hypertension  - Labetalol , nifedipine     7. Leukopenia; anemia  - Appears stable, likely due to Ibrance     DVT prophylaxis: sq heparin    Code Status: Full  Level of Care: Level of care: Telemetry Family Communication: none present  Disposition Plan:  Patient is from: Home  Anticipated d/c is to: Home  Anticipated d/c date is: 9/10 or  07/27/24  Patient currently: Pending V/Q scan, cardiac monitoring, clinical stability  Consults called: None  Admission status: Observation     Evalene GORMAN Sprinkles, MD [facility]  07/25/2024, 9:08 PM

## 2024-07-25 NOTE — Telephone Encounter (Signed)
 I called and spoke to patient and she stated that she decided to visit the ER after speaking to triage. She states that she's became frustrated because nobody can figure out what's going on with her. Message routed to PCP Caro, Harlene POUR, NP as RICK.

## 2024-07-25 NOTE — Telephone Encounter (Signed)
 FYI Only or Action Required?: FYI only for provider.  Patient was last seen in primary care on 06/14/2024 by Medina-Vargas, Jereld BROCKS, NP.  Called Nurse Triage reporting Abdominal Pain and Palpitations.  Symptoms began several weeks ago.  Interventions attempted: Nothing.  Symptoms are: gradually worsening.  Triage Disposition: See HCP Within 4 Hours (Or PCP Triage)  Patient/caregiver understands and will follow disposition?: Yes         Answer Assessment - Initial Assessment Questions 1. DESCRIPTION: Please describe your heart rate or heartbeat that you are having (e.g., fast/slow, regular/irregular, skipped or extra beats, palpitations)     Heart flutter, palpitations, feels like heart is beating hard.  2. ONSET: When did it start? (e.g., minutes, hours, days)      2-3 weeks.  3. DURATION: How long does it last (e.g., seconds, minutes, hours)     10 minutes.  4. PATTERN Does it come and go, or has it been constant since it started?  Does it get worse with exertion?   Are you feeling it now?     Comes and goes, comes on with the abdominal pain. No present now.  5. TAP: Using your hand, can you tap out what you are feeling on a chair or table in front of you, so that I can hear? Note: Not all patients can do this.       N/A. Unable to perform.  6. HEART RATE: Can you tell me your heart rate? How many beats in 15 seconds?  Note: Not all patients can do this.       BP 130/74   7. RECURRENT SYMPTOM: Have you ever had this before? If Yes, ask: When was the last time? and What happened that time?      First time.  8. CAUSE: What do you think is causing the palpitations?     I don't know That pain in my stomach on the L side might have something to do with it  9. CARDIAC HISTORY: Do you have any history of heart disease? (e.g., heart attack, angina, bypass surgery, angioplasty, arrhythmia)      HTN  10. OTHER SYMPTOMS: Do you have any  other symptoms? (e.g., dizziness, chest pain, sweating, difficulty breathing)       Occasional dizziness - lasts a few seconds that self-resolves.  11. PREGNANCY: Is there any chance you are pregnant? When was your last menstrual period?       N/A.  Reason for Disposition  Age > 60 years  (Exception: Brief heartbeat symptoms that went away and now feels well.)  Additional Information  Commented on: Answer Assessment    Triager attempted to find AV with PCP, but no access. Triager called PCP CAL and spoke to Darice and confirmed no availability. Triager offered to schedule with nearest [facility], but pt declined. Triager provided address and current wait time for [facility]. Patient verbalized understanding.  Protocols used: Heart Rate and Heartbeat Questions-A-AH

## 2024-07-25 NOTE — ED Triage Notes (Signed)
 Pt reports RLQ pain that radiates to her left chest x 3 weeks.

## 2024-07-25 NOTE — ED Notes (Signed)
 Patient is being discharged from the Urgent Care and sent to the Emergency Department via Private Vehicle . Per Provider, patient is in need of higher level of care due to Right Lower Abd Pain (R/O Appendicitis). Patient is aware and verbalizes understanding of plan of care.  Vitals:   07/25/24 0949  BP: 114/68  Pulse: 71  Resp: (!) 22  Temp: 97.9 F (36.6 C)  SpO2: 99%

## 2024-07-25 NOTE — ED Provider Notes (Signed)
 [facility]    CSN: [id number] Arrival date & time: 07/25/24  0940      History   Chief Complaint Chief Complaint  Patient presents with   Pain    HPI Michele Page is a 68 y.o. female.   Patient here today for evaluation and intermittent right lower abdominal pain that started recently.  She reports she has had some heart palpitations as well.  She denies any vomiting or nausea.  She denies shortness of breath or chest pain.  The history is provided by the patient.    Past Medical History:  Diagnosis Date   Anemia    past hx many yrs ago    Arthritis    lt knee   Breast cancer ([name])    Breast cancer of 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) 04/02/2016   Chronic kidney disease    CKD   Colon polyps    COPD (chronic obstructive pulmonary disease) ([name])    GERD (gastroesophageal reflux disease)    years ago- not current    Hyperlipidemia    Hypertension    Multiple sclerosis ([name])    Personal history of chemotherapy    Personal history of radiation therapy    Stroke ([name])    2013, mild cognitive deficits   Vision abnormalities     Patient Active Problem List   Diagnosis Date Noted   Overactive bladder 06/05/2024   Chronic gout of left hand 06/05/2024   Morbid obesity ([name]) 06/05/2024   Port-A-Cath in place 11/06/2021   Malignant neoplasm of upper-outer quadrant of right breast in female, estrogen receptor positive ([name]) 08/04/2021   Neck pain on right side 06/01/2018   History of breast cancer 11/25/2017   Primary insomnia 11/25/2017   Breast cancer of 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) 04/02/2016   Essential hypertension 01/10/2016   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 01/10/2016   Gastroesophageal reflux disease without esophagitis 01/10/2016   Hyperlipidemia 01/10/2016   CKD (chronic kidney disease) stage 4, GFR 15-29 ml/min ([name]) 01/10/2016   Multiple sclerosis ([name]) 03/20/2015    Accelerated hypertension 03/20/2015   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/2015    Past Surgical History:  Procedure Laterality Date   BREAST BIOPSY Left 03/31/2016   BREAST BIOPSY Right 07/24/2021   BREAST LUMPECTOMY Left 04/17/2016   BREAST LUMPECTOMY Right 09/02/2021   BREAST LUMPECTOMY WITH RADIOACTIVE SEED AND SENTINEL LYMPH NODE BIOPSY Right 09/02/2021   Procedure: RIGHT BREAST LUMPECTOMY WITH RADIOACTIVE SEED AND SENTINEL LYMPH NODE BIOPSY;  Surgeon: Vanderbilt Ned, MD;  Location: [facility];  Service: General;  Laterality: Right;   COLONOSCOPY     HYSTEROSCOPY WITH D & C N/A 12/19/2015   Procedure: DILATATION AND CURETTAGE /HYSTEROSCOPY;  Surgeon: Duwaine Blumenthal, DO;  Location: [facility];  Service: Gynecology;  Laterality: N/A;   POLYPECTOMY     PORTACATH PLACEMENT Right 10/16/2021   Procedure: INSERTION PORT-A-CATH;  Surgeon: Vanderbilt Ned, MD;  Location: [facility];  Service: General;  Laterality: Right;   RADIOACTIVE SEED GUIDED PARTIAL MASTECTOMY WITH AXILLARY SENTINEL LYMPH NODE BIOPSY Left 04/17/2016   Procedure: RADIOACTIVE SEED GUIDED PARTIAL MASTECTOMY WITH AXILLARY SENTINEL LYMPH NODE BIOPSY;  Surgeon: Ned Vanderbilt, MD;  Location: [facility];  Service: General;  Laterality: Left;  RADIOACTIVE SEED GUIDED PARTIAL MASTECTOMY WITH AXILLARY SENTINEL LYMPH NODE BIOPSY    TUBAL LIGATION     VAGINAL DELIVERY     x3  OB History     Gravida  3   Para  3   Term      Preterm      AB      Living  3      SAB      IAB      Ectopic      Multiple      Live Births               Home Medications    Prior to Admission medications   Medication Sig Start Date End Date Taking? Authorizing Provider  allopurinol  (ZYLOPRIM ) 100 MG tablet Take 0.5 tablets (50 mg total) by mouth every other day. 05/25/24   Caro Harlene POUR, NP  atorvastatin  (LIPITOR) 20 MG tablet Take 1 tablet (20 mg total)  by mouth daily. 08/16/23   Caro Harlene POUR, NP  labetalol  (NORMODYNE ) 300 MG tablet Take 2 tablets (600 mg total) by mouth 3 (three) times daily. 07/21/24   Eubanks, Jessica K, NP  letrozole  (FEMARA ) 2.5 MG tablet TAKE 1 TABLET EVERY DAY 08/16/23   Odean Potts, MD  NIFEdipine  (PROCARDIA  XL/NIFEDICAL XL) 60 MG 24 hr tablet Take 1 tablet (60 mg total) by mouth 2 (two) times daily. 12/14/23   Caro Harlene POUR, NP  oxybutynin  (DITROPAN ) 5 MG tablet Take 10 mg by mouth daily. 05/29/24   [provider]  palbociclib  (IBRANCE ) 75 MG tablet Take 1 tablet (75 mg total) by mouth daily. Take for 21 days on, 7 days off, repeat every 28 days. 06/08/24   Odean Potts, MD    Family History Family History  Problem Relation Age of Onset   Hypertension Mother    Stroke Mother    Stroke Sister    Alcohol abuse Brother    HIV/AIDS Brother    Breast cancer Cousin        dx 46s   Colon cancer Neg Hx    Colon polyps Neg Hx    Esophageal cancer Neg Hx    Rectal cancer Neg Hx    Stomach cancer Neg Hx     Social History Social History   Tobacco Use   Smoking status: Former    Current packs/day: 0.00    Average packs/day: 0.5 packs/day for 25.0 years (12.5 ttl pk-yrs)    Types: Cigarettes    Start date: 11/17/1979    Quit date: 11/16/2004    Years since quitting: 19.7   Smokeless tobacco: Never  Vaping Use   Vaping status: Never Used  Substance Use Topics   Alcohol use: Yes    Comment: Occassionally.   Drug use: No     Allergies   Patient has no known allergies.   Review of Systems Review of Systems  Constitutional:  Negative for chills and fever.  Eyes:  Negative for discharge and redness.  Respiratory:  Negative for shortness of breath.   Cardiovascular:  Positive for palpitations. Negative for chest pain.  Gastrointestinal:  Positive for abdominal pain. Negative for nausea and vomiting.     Physical Exam Triage Vital Signs ED Triage Vitals  Encounter Vitals Group     BP  07/25/24 0949 114/68     Girls Systolic BP Percentile --      Girls Diastolic BP Percentile --      Boys Systolic BP Percentile --      Boys Diastolic BP Percentile --      Pulse Rate 07/25/24 0949 71     Resp 07/25/24 0949 ROLLEN)  22     Temp 07/25/24 0949 97.9 F (36.6 C)     Temp Source 07/25/24 0949 Oral     SpO2 07/25/24 0949 99 %     Weight 07/25/24 0955 180 lb (81.6 kg)     Height 07/25/24 0955 5' 2 (1.575 m)     Head Circumference --      Peak Flow --      Pain Score 07/25/24 0955 6     Pain Loc --      Pain Education --      Exclude from Growth Chart --    No data found.  Updated Vital Signs BP 114/68 (BP Location: Left Arm)   Pulse 71   Temp 97.9 F (36.6 C) (Oral)   Resp (!) 22   Ht 5' 2 (1.575 m)   Wt 180 lb (81.6 kg)   SpO2 99%   BMI 32.92 kg/m   Visual Acuity Right Eye Distance:   Left Eye Distance:   Bilateral Distance:    Right Eye Near:   Left Eye Near:    Bilateral Near:     Physical Exam Vitals and nursing note reviewed.  Constitutional:      General: She is not in acute distress.    Appearance: Normal appearance. She is not ill-appearing.  HENT:     Head: Normocephalic and atraumatic.  Eyes:     Conjunctiva/sclera: Conjunctivae normal.  Cardiovascular:     Rate and Rhythm: Normal rate and regular rhythm.  Pulmonary:     Effort: Pulmonary effort is normal. No respiratory distress.     Breath sounds: No wheezing, rhonchi or rales.  Abdominal:     General: Abdomen is flat. Bowel sounds are normal. There is no distension.     Palpations: Abdomen is soft.     Tenderness: There is abdominal tenderness (RLQ). There is no guarding or rebound.  Neurological:     Mental Status: She is alert.  Psychiatric:        Mood and Affect: Mood normal.        Behavior: Behavior normal.        Thought Content: Thought content normal.      UC Treatments / Results  Labs (all labs ordered are listed, but only abnormal results are displayed) Labs  Reviewed - No data to display  EKG   Radiology No results found.  Procedures Procedures (including critical care time)  Medications Ordered in UC Medications - No data to display  Initial Impression / Assessment and Plan / UC Course  I have reviewed the triage vital signs and the nursing notes.  Pertinent labs & imaging results that were available during my care of the patient were reviewed by me and considered in my medical decision making (see chart for details).    EKG without concerning findings.  Recommended further evaluation in the emergency department for stat.  Patient is agreeable to same and will transport via POV as she feels stable to do so.  Final Clinical Impressions(s) / UC Diagnoses   Final diagnoses:  RLQ abdominal pain  Palpitations   Discharge Instructions   None    ED Prescriptions   None    PDMP not reviewed this encounter.   Billy Asberry FALCON, PA-C 07/25/24 1027

## 2024-07-25 NOTE — ED Triage Notes (Signed)
 Patient reports with intermittent stomach pain (lower right side) and then heart palpitations shortly after but these things keep occurring. No sob. No chest pain.

## 2024-07-25 NOTE — ED Provider Notes (Signed)
 Patient was initially seen by Dr. Jackquline.  Please see his note. Plan was for admission to the hospital for further workup of chest pain and elevated D-dimer.  Patient is a candidate for CT angio  Patient was also having trouble with abdominal pain.  CT scan of the abdomen pelvis was pending  CT scan does not show any acute abnormality  Case dicsussed with Dr Charlton Randol Simmonds, MD 07/25/24 2043

## 2024-07-25 NOTE — ED Provider Notes (Signed)
 [place] EMERGENCY DEPARTMENT AT [facility] Provider Note   CSN: [id number] Arrival date & time: 07/25/24  1115     Patient presents with: Chest Pain   Michele Page is a 68 y.o. female.    Chest Pain       She presents with nonspecific chest pain.  Usually sharp in nature.  Has been happening over the past 3 weeks.  Usually lasts only for a couple of seconds.  Endorses some maybe palpitations but not associated with the chest pain.  slight pleuritic chest pain.  No hemoptysis.  No history of DVT or PE.  Does states that she has a history of breast cancer.  No cardiac history.  No exertional chest pain.  No cardiac history.  Also endorsing some right lower quadrant abdominal pain.  No appears abdominal surgeries.  No dysuria.  No hematuria.  No significant pain with p.o. intake.  Previous medical history reviewed : Patient went to go see urgent care today.  Patient referred to the ED for further evaluation.  Patient follows with oncology. Currently on Palbociclib .    Prior to Admission medications   Medication Sig Start Date End Date Taking? Authorizing Provider  allopurinol  (ZYLOPRIM ) 100 MG tablet Take 0.5 tablets (50 mg total) by mouth every other day. 05/25/24   Caro Harlene POUR, NP  atorvastatin  (LIPITOR) 20 MG tablet Take 1 tablet (20 mg total) by mouth daily. 08/16/23   Caro Harlene POUR, NP  labetalol  (NORMODYNE ) 300 MG tablet Take 2 tablets (600 mg total) by mouth 3 (three) times daily. 07/21/24   Eubanks, Jessica K, NP  letrozole  (FEMARA ) 2.5 MG tablet TAKE 1 TABLET EVERY DAY 08/16/23   Gudena, Vinay, MD  NIFEdipine  (PROCARDIA  XL/NIFEDICAL XL) 60 MG 24 hr tablet Take 1 tablet (60 mg total) by mouth 2 (two) times daily. 12/14/23   Caro Harlene POUR, NP  oxybutynin  (DITROPAN ) 5 MG tablet Take 10 mg by mouth daily. 05/29/24   [provider]  palbociclib  (IBRANCE ) 75 MG tablet Take 1 tablet (75 mg total) by mouth daily. Take for 21 days on, 7 days off,  repeat every 28 days. 06/08/24   Gudena, Vinay, MD    Allergies: Patient has no known allergies.    Review of Systems  Cardiovascular:  Positive for chest pain.    Updated Vital Signs BP (!) 128/99 (BP Location: Left Arm)   Pulse 67   Temp 98 F (36.7 C) (Oral)   Resp 18   Ht 5' 2 (1.575 m)   Wt 85.7 kg   SpO2 98%   BMI 34.57 kg/m   Physical Exam Vitals and nursing note reviewed.  Constitutional:      General: She is not in acute distress.    Appearance: She is well-developed.  HENT:     Head: Normocephalic and atraumatic.  Eyes:     Conjunctiva/sclera: Conjunctivae normal.  Cardiovascular:     Rate and Rhythm: Normal rate and regular rhythm.     Heart sounds: No murmur heard. Pulmonary:     Effort: Pulmonary effort is normal. No respiratory distress.     Breath sounds: Normal breath sounds.  Abdominal:     Palpations: Abdomen is soft.     Tenderness: There is no abdominal tenderness.  Musculoskeletal:        General: No swelling.     Cervical back: Neck supple.  Skin:    General: Skin is warm and dry.     Capillary Refill: Capillary  refill takes less than 2 seconds.  Neurological:     Mental Status: She is alert.  Psychiatric:        Mood and Affect: Mood normal.     (all labs ordered are listed, but only abnormal results are displayed) Labs Reviewed  BASIC METABOLIC PANEL WITH GFR - Abnormal; Notable for the following components:      Result Value   CO2 21 (*)    Glucose, Bld 109 (*)    Creatinine, Ser 2.20 (*)    GFR, Estimated 24 (*)    Anion gap 16 (*)    All other components within normal limits  CBC - Abnormal; Notable for the following components:   WBC 2.9 (*)    RBC 2.93 (*)    Hemoglobin 9.8 (*)    HCT 30.1 (*)    MCV 102.7 (*)    Platelets 417 (*)    All other components within normal limits  D-DIMER, QUANTITATIVE - Abnormal; Notable for the following components:   D-Dimer, Quant 1.29 (*)    All other components within normal limits   TROPONIN T, HIGH SENSITIVITY - Abnormal; Notable for the following components:   Troponin T High Sensitivity 43 (*)    All other components within normal limits  TROPONIN T, HIGH SENSITIVITY - Abnormal; Notable for the following components:   Troponin T High Sensitivity 38 (*)    All other components within normal limits  LIPASE, BLOOD  HEPATIC FUNCTION PANEL    EKG: EKG Interpretation Date/Time:  Tuesday July 25 2024 11:30:40 EDT Ventricular Rate:  78 PR Interval:  152 QRS Duration:  78 QT Interval:  382 QTC Calculation: 436 R Axis:   8  Text Interpretation: Sinus rhythm Confirmed by Simon Rea [id number]) on 07/25/2024 1:47:55 PM  Radiology: [facility] Chest Port 1 View Result Date: 07/25/2024 CLINICAL DATA:  [id number] Surgery, elective [id number]. EXAM: PORTABLE CHEST 1 VIEW COMPARISON:  05/03/2023. FINDINGS: Low lung volume. Bilateral lung fields are clear. Bilateral costophrenic angles are clear. Stable cardio-mediastinal silhouette. No acute osseous abnormalities. The soft tissues are within normal limits. IMPRESSION: No active disease. Electronically Signed   By: Ree Molt M.D.   On: 07/25/2024 14:18     Procedures   Medications Ordered in the ED  sodium chloride  0.9 % bolus 500 mL (500 mLs Intravenous New Bag/Given 07/25/24 1619)  iohexol  (OMNIPAQUE ) 9 MG/ML oral solution 500 mL (500 mLs Oral Contrast Given 07/25/24 1720)                                    Medical Decision Making Amount and/or Complexity of Data Reviewed Labs: ordered. Radiology: ordered.  Risk Prescription drug management.      Previous medical history reviewed : Patient went to go see urgent care today.  Patient referred to the ED for further evaluation.  Patient follows with oncology. Currently on Palbociclib .   In terms of patient's chest pain.  Troponin x 2 unremarkable.  EKG showed no changes compared to prior.  Patient remained in cardiac telemetry.  No significant arrhythmia  appreciated.  Patient does have some risk factors for PE.  Patient has some slight pleuritic chest pain.  Also has a history of cancer.  Therefore, low to moderate risk.  Therefore, did obtain D-dimer.  D-dimer positive.  Unfortunately patient's kidney function does not allow for any kind IV contrast.  Therefore, we will have to order VQ  scan.  Ordered VQ scan.  Fortunately, too late in the day to have this completed.  Will be able to be completed early in the morning tomorrow.  Patient will likely have to be admitted for VQ scan in the morning.   She does have pain to palpation in right lower quadrant as well.  Obtaining CT scan with p.o. contrast.  No IV contrast.  Want to rule out pathology such as diverticulitis and/or appendicitis.  Otherwise, patient's laboratory workup unremarkable.  Patient was given 500 cc of fluid.    Signed out pending CT abd.       Final diagnoses:  Chest pain, unspecified type  Lower abdominal pain    ED Discharge Orders     None          Simon Lavonia SAILOR, MD 07/25/24 1651

## 2024-07-26 ENCOUNTER — Observation Stay (HOSPITAL_COMMUNITY)

## 2024-07-26 DIAGNOSIS — R0789 Other chest pain: Secondary | ICD-10-CM

## 2024-07-26 DIAGNOSIS — R079 Chest pain, unspecified: Secondary | ICD-10-CM

## 2024-07-26 DIAGNOSIS — R7989 Other specified abnormal findings of blood chemistry: Secondary | ICD-10-CM | POA: Diagnosis not present

## 2024-07-26 LAB — CBC WITH DIFFERENTIAL/PLATELET
Abs Immature Granulocytes: 0.02 K/uL (ref 0.00–0.07)
Basophils Absolute: 0 K/uL (ref 0.0–0.1)
Basophils Relative: 2 %
Eosinophils Absolute: 0 K/uL (ref 0.0–0.5)
Eosinophils Relative: 1 %
HCT: 27.9 % — ABNORMAL LOW (ref 36.0–46.0)
Hemoglobin: 9.2 g/dL — ABNORMAL LOW (ref 12.0–15.0)
Immature Granulocytes: 1 %
Lymphocytes Relative: 34 %
Lymphs Abs: 0.9 K/uL (ref 0.7–4.0)
MCH: 34.6 pg — ABNORMAL HIGH (ref 26.0–34.0)
MCHC: 33 g/dL (ref 30.0–36.0)
MCV: 104.9 fL — ABNORMAL HIGH (ref 80.0–100.0)
Monocytes Absolute: 0.1 K/uL (ref 0.1–1.0)
Monocytes Relative: 5 %
Neutro Abs: 1.4 K/uL — ABNORMAL LOW (ref 1.7–7.7)
Neutrophils Relative %: 57 %
Platelets: 358 K/uL (ref 150–400)
RBC: 2.66 MIL/uL — ABNORMAL LOW (ref 3.87–5.11)
RDW: 13.5 % (ref 11.5–15.5)
WBC: 2.5 K/uL — ABNORMAL LOW (ref 4.0–10.5)
nRBC: 0 % (ref 0.0–0.2)

## 2024-07-26 LAB — BASIC METABOLIC PANEL WITH GFR
Anion gap: 16 — ABNORMAL HIGH (ref 5–15)
BUN: 26 mg/dL — ABNORMAL HIGH (ref 8–23)
CO2: 19 mmol/L — ABNORMAL LOW (ref 22–32)
Calcium: 9.5 mg/dL (ref 8.9–10.3)
Chloride: 107 mmol/L (ref 98–111)
Creatinine, Ser: 2.03 mg/dL — ABNORMAL HIGH (ref 0.44–1.00)
GFR, Estimated: 26 mL/min — ABNORMAL LOW (ref >60)
Glucose, Bld: 91 mg/dL (ref 70–99)
Potassium: 3.6 mmol/L (ref 3.5–5.1)
Sodium: 142 mmol/L (ref 135–145)

## 2024-07-26 LAB — ECHOCARDIOGRAM COMPLETE
Area-P 1/2: 4.08 cm2
Height: 62 in
S' Lateral: 3 cm
Weight: 3075.86 [oz_av]

## 2024-07-26 LAB — TROPONIN T, HIGH SENSITIVITY: Troponin T High Sensitivity: 36 ng/L — ABNORMAL HIGH (ref 0–19)

## 2024-07-26 LAB — HIV ANTIBODY (ROUTINE TESTING W REFLEX): HIV Screen 4th Generation wRfx: NONREACTIVE

## 2024-07-26 MED ORDER — LETROZOLE 2.5 MG PO TABS
2.5000 mg | ORAL_TABLET | Freq: Every day | ORAL | Status: DC
  Administered 2024-07-26: 2.5 mg via ORAL
  Filled 2024-07-26: qty 1

## 2024-07-26 MED ORDER — PALBOCICLIB 75 MG PO TABS: 75.0000 mg | ORAL_TABLET | Freq: Every day | ORAL | Status: DC

## 2024-07-26 MED ORDER — TECHNETIUM TO 99M ALBUMIN AGGREGATED
3.9700 | Freq: Once | INTRAVENOUS | Status: AC
  Administered 2024-07-26: 3.97 via INTRAVENOUS

## 2024-07-26 NOTE — Progress Notes (Signed)
  Echocardiogram 2D Echocardiogram has been performed.  Tinnie FORBES Gosling RDCS 07/26/2024, 9:30 AM

## 2024-07-26 NOTE — Hospital Course (Signed)
 Michele Page is a 68 y.o. female with medical history significant for hypertension, hyperlipidemia, history of CVA with residual deficits, CKD stage IV, and breast cancer who presented with right lower quadrant abdominal pain, left upper chest pain, and palpitations.  She denied any significant similar episodes in the past.  She had difficulty describing the type of pain.  She denied heavy dull pain and denied sharp shooting or spasm pains.  The pains occurred spontaneously at rest and resolved spontaneously. No other obvious association with rest or exertion nor any recent trauma or increased physical activity.  She was admitted for further workup.  Notable labs included baseline CKD 4 with creatinine at baseline of 2-2.2. Troponin indeterminately elevated initially 43 and repeat 38. D-dimer was mildly elevated, 1.29  In setting of her CKD, VQ scan was obtained which showed no perfusion defects to suggest PE. EKG was negative for signs of ischemia. Echo was obtained which showed normal EF, 65 to 70%, no RWMA, mild LVH, grade 1 diastolic dysfunction.  Estimated RV systolic pressure 23.2 mmHg.  Her symptoms remained resolved during hospitalization.  Etiology was considered noncardiac for her chest pain.  She had no events noted on telemetry monitoring during hospitalization either.  If does have further recurrence of palpitations or pain, can consider at least Zio patch monitoring at that time.  She was discharged home in stable condition.

## 2024-07-26 NOTE — Plan of Care (Signed)
   Problem: Education: Goal: Knowledge of General Education information will improve Description: Including pain rating scale, medication(s)/side effects and non-pharmacologic comfort measures Outcome: Progressing   Problem: Activity: Goal: Risk for activity intolerance will decrease Outcome: Progressing   Problem: Nutrition: Goal: Adequate nutrition will be maintained Outcome: Progressing   Problem: Coping: Goal: Level of anxiety will decrease Outcome: Progressing

## 2024-07-26 NOTE — Progress Notes (Signed)
   07/26/24 1034  TOC Brief Assessment  Insurance and Status Reviewed  Patient has primary care physician Yes  Home environment has been reviewed Resides in single family home with spouse  Prior level of function: Independent with ADLs at baseline  Prior/Current Home Services No current home services  Social Drivers of Health Review SDOH reviewed no interventions necessary  Readmission risk has been reviewed Yes  Transition of care needs no transition of care needs at this time

## 2024-07-26 NOTE — Discharge Summary (Signed)
 Physician Discharge Summary   [name] FMW:[id number] DOB: 11/25/1955 DOA: 07/25/2024  PCP: Caro Harlene POUR, NP  Admit date: 07/25/2024 Discharge date: 07/26/2024  Admitted From: Home Disposition:  Home Discharging physician: Alm Apo, MD Barriers to discharge: none  Recommendations at discharge: If recurrent CP/palpitations, consider zio patch  Weight loss and lifestyle changes recommended   Discharge Condition: stable CODE STATUS: Full  Diet recommendation:  Diet Orders (From admission, onward)     Start     Ordered   07/26/24 1138  Diet regular Fluid consistency: Thin  Diet effective now       Question:  Fluid consistency:  Answer:  Thin   07/26/24 1137   07/26/24 0000  Diet - low sodium heart healthy        07/26/24 1539            Hospital Course: [name] is a 68 y.o. female with medical history significant for hypertension, hyperlipidemia, history of CVA with residual deficits, CKD stage IV, and breast cancer who presented with right lower quadrant abdominal pain, left upper chest pain, and palpitations.  She denied any significant similar episodes in the past.  She had difficulty describing the type of pain.  She denied heavy dull pain and denied sharp shooting or spasm pains.  The pains occurred spontaneously at rest and resolved spontaneously. No other obvious association with rest or exertion nor any recent trauma or increased physical activity.  She was admitted for further workup.  Notable labs included baseline CKD 4 with creatinine at baseline of 2-2.2. Troponin indeterminately elevated initially 43 and repeat 38. D-dimer was mildly elevated, 1.29  In setting of her CKD, VQ scan was obtained which showed no perfusion defects to suggest PE. EKG was negative for signs of ischemia. Echo was obtained which showed normal EF, 65 to 70%, no RWMA, mild LVH, grade 1 diastolic dysfunction.  Estimated RV systolic pressure 23.2 mmHg.  Her symptoms  remained resolved during hospitalization.  Etiology was considered noncardiac for her chest pain.  She had no events noted on telemetry monitoring during hospitalization either.  If does have further recurrence of palpitations or pain, can consider at least Zio patch monitoring at that time.  She was discharged home in stable condition.  The patient's acute and chronic medical conditions were treated accordingly. On day of discharge, patient was felt deemed stable for discharge. Patient/family member advised to call PCP or come back to ER if needed.   Principal Diagnosis: Non-cardiac chest pain  Discharge Diagnoses: Active Hospital Problems   Diagnosis Date Noted   Elevated troponin 07/25/2024    Priority: 3.   Positive D dimer 07/25/2024    Priority: 4.   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   Palpitations 07/25/2024 07/26/2024    Priority: 2.     Discharge Instructions     Diet - low sodium heart healthy   Complete by: As directed    Increase activity slowly   Complete by: As directed  Allergies as of 07/26/2024   No Known Allergies      Medication List     TAKE these medications    allopurinol  100 MG tablet Commonly known as: ZYLOPRIM  Take 0.5 tablets (50 mg total) by mouth every other day.   aspirin  EC 81 MG tablet Take 81 mg by mouth daily. Swallow whole.   atorvastatin  20 MG tablet Commonly known as: LIPITOR Take 1 tablet (20 mg total) by mouth daily.   Ibrance  75 MG  tablet Generic drug: palbociclib  Take 1 tablet (75 mg total) by mouth daily. Take for 21 days on, 7 days off, repeat every 28 days.   labetalol  300 MG tablet Commonly known as: NORMODYNE  Take 2 tablets (600 mg total) by mouth 3 (three) times daily.   letrozole  2.5 MG tablet Commonly known as: FEMARA  TAKE 1 TABLET EVERY DAY   NIFEdipine  60 MG 24 hr tablet Commonly known as: PROCARDIA  XL/NIFEDICAL XL Take 1 tablet (60 mg total) by mouth 2 (two) times daily.   oxybutynin  5 MG tablet Commonly known as: DITROPAN  Take 10 mg by mouth daily.        No Known Allergies  Consultations:   Procedures:   Discharge Exam: BP 126/67   Pulse 82   Temp 98.3 F (36.8 C) (Oral)   Resp 18   Ht 5' 2 (1.575 m)   Wt 87.2 kg   SpO2 96%   BMI 35.16 kg/m  Physical Exam Constitutional:      Appearance: Normal appearance.  HENT:     Head: Normocephalic and atraumatic.     Mouth/Throat:     Mouth: Mucous membranes are moist.  Eyes:     Extraocular Movements: Extraocular movements intact.  Cardiovascular:     Rate and Rhythm: Normal rate and regular rhythm.  Pulmonary:     Effort: Pulmonary effort is normal. No respiratory distress.     Breath sounds: Normal breath sounds. No wheezing.  Abdominal:     General: Bowel sounds are normal. There is no distension.     Palpations: Abdomen is soft.     Tenderness: There is no abdominal tenderness.  Musculoskeletal:        General: Normal range of motion.     Cervical back: Normal range of motion and neck supple.  Skin:    General: Skin is warm and dry.  Neurological:     General: No focal deficit present.     Mental Status: She is alert.  Psychiatric:        Mood and Affect: Mood normal.      The results of significant diagnostics from this hospitalization (including imaging, microbiology, ancillary and laboratory) are listed below for reference.   Microbiology: No results found for this or any previous visit (from the past 240  hours).   Labs: BNP (last 3 results) No results for input(s): BNP in the last 8760 hours. Basic Metabolic Panel: Recent Labs  Lab 07/25/24 1143 07/26/24 0600  NA 143 142  K 3.8 3.6  CL 106 107  CO2 21* 19*  GLUCOSE 109* 91  BUN 23 26*  CREATININE 2.20* 2.03*  CALCIUM  9.9 9.5   Liver Function Tests: Recent Labs  Lab 07/25/24 1509  AST 20  ALT 12  ALKPHOS 120  BILITOT 0.5  PROT 7.1  ALBUMIN  4.3   Recent Labs  Lab 07/25/24 1143  LIPASE 22   No results for input(s): AMMONIA in the last 168 hours. CBC: Recent Labs  Lab 07/25/24 1143 07/26/24  0600  WBC 2.9* 2.5*  NEUTROABS  --  1.4*  HGB 9.8* 9.2*  HCT 30.1* 27.9*  MCV 102.7* 104.9*  PLT 417* 358   Cardiac Enzymes: No results for input(s): CKTOTAL, CKMB, CKMBINDEX, TROPONINI in the last 168 hours. BNP: Invalid input(s): POCBNP CBG: No results for input(s): GLUCAP in the last 168 hours. D-Dimer Recent Labs    07/25/24 1509  DDIMER 1.29*   Hgb A1c No results for input(s): HGBA1C in the last 72 hours. Lipid Profile No results for input(s): CHOL, HDL, LDLCALC, TRIG, CHOLHDL, LDLDIRECT in the last 72 hours. Thyroid  function studies No results for input(s): TSH, T4TOTAL, T3FREE, THYROIDAB in the last 72 hours.  Invalid input(s): FREET3 Anemia work up No results for input(s): VITAMINB12, FOLATE, FERRITIN, TIBC, IRON, RETICCTPCT in the last 72 hours. Urinalysis    Component Value Date/Time   COLORURINE YELLOW 05/24/2024 0830   APPEARANCEUR HAZY (A) 05/24/2024 0830   APPEARANCEUR Clear 01/23/2015 1014   LABSPEC 1.018 05/24/2024 0830   PHURINE 5.0 05/24/2024 0830   GLUCOSEU NEGATIVE 05/24/2024 0830   HGBUR NEGATIVE 05/24/2024 0830   BILIRUBINUR NEGATIVE 05/24/2024 0830   BILIRUBINUR Negative 01/23/2015 1014   KETONESUR 5 (A) 05/24/2024 0830   PROTEINUR 30 (A) 05/24/2024 0830   UROBILINOGEN 0.2 11/05/2014 1248   NITRITE NEGATIVE 05/24/2024 0830    LEUKOCYTESUR MODERATE (A) 05/24/2024 0830   Sepsis Labs Recent Labs  Lab 07/25/24 1143 07/26/24 0600  WBC 2.9* 2.5*   Microbiology No results found for this or any previous visit (from the past 240 hours).  Procedures/Studies: ECHOCARDIOGRAM COMPLETE Result Date: 07/26/2024    ECHOCARDIOGRAM REPORT   Patient Name:   Michele Page Date of Exam: 07/26/2024 Medical Rec #:  [id number]    Height:       62.0 in Accession #:    [id number]   Weight:       192.2 lb Date of Birth:  05/03/1956    BSA:          1.880 m Patient Age:    67 years     BP:           134/64 mmHg Patient Gender: F            HR:           74 bpm. Exam Location:  Inpatient Procedure: 2D Echo, Cardiac Doppler and Color Doppler (Both Spectral and Color            Flow Doppler were utilized during procedure). Indications:    Chest Pain R07.9  History:        Patient has no prior history of Echocardiogram examinations.                 Stroke; Risk Factors:Hypertension and Dyslipidemia.  Sonographer:    Tinnie Gosling RDCS Referring Phys: [phone] TIMOTHY S OPYD IMPRESSIONS  1. Left ventricular ejection fraction, by estimation, is 65 to 70%. The left ventricle has normal function. The left ventricle has no regional wall motion abnormalities. There is mild left ventricular hypertrophy. Left ventricular diastolic parameters are consistent with Grade I diastolic dysfunction (impaired relaxation).  2. Right ventricular systolic function is normal. The right ventricular size is normal. There is normal pulmonary artery systolic pressure. The estimated right ventricular systolic pressure is 23.2 mmHg.  3. The mitral valve is grossly normal. Trivial mitral valve regurgitation.  4. The aortic valve is tricuspid. Aortic valve regurgitation is not visualized.  5. The inferior vena cava is normal  in size with greater than 50% respiratory variability, suggesting right atrial pressure of 3 mmHg.  6. Cannot exclude a small PFO. Comparison(s): No prior  Echocardiogram. FINDINGS  Left Ventricle: Left ventricular ejection fraction, by estimation, is 65 to 70%. The left ventricle has normal function. The left ventricle has no regional wall motion abnormalities. The left ventricular internal cavity size was normal in size. There is  mild left ventricular hypertrophy. Left ventricular diastolic parameters are consistent with Grade I diastolic dysfunction (impaired relaxation). Indeterminate filling pressures. Right Ventricle: The right ventricular size is normal. No increase in right ventricular wall thickness. Right ventricular systolic function is normal. There is normal pulmonary artery systolic pressure. The tricuspid regurgitant velocity is 2.25 m/s, and  with an assumed right atrial pressure of 3 mmHg, the estimated right ventricular systolic pressure is 23.2 mmHg. Left Atrium: Left atrial size was normal in size. Right Atrium: Right atrial size was normal in size. Pericardium: There is no evidence of pericardial effusion. Mitral Valve: The mitral valve is grossly normal. Trivial mitral valve regurgitation. Tricuspid Valve: The tricuspid valve is grossly normal. Tricuspid valve regurgitation is mild. Aortic Valve: The aortic valve is tricuspid. Aortic valve regurgitation is not visualized. Pulmonic Valve: The pulmonic valve was normal in structure. Pulmonic valve regurgitation is not visualized. Aorta: The aortic root and ascending aorta are structurally normal, with no evidence of dilitation. Venous: The inferior vena cava is normal in size with greater than 50% respiratory variability, suggesting right atrial pressure of 3 mmHg. IAS/Shunts: Cannot exclude a small PFO.  LEFT VENTRICLE PLAX 2D LVIDd:         4.80 cm   Diastology LVIDs:         3.00 cm   LV e' medial:    5.66 cm/s LV PW:         1.20 cm   LV E/e' medial:  16.1 LV IVS:        1.10 cm   LV e' lateral:   6.74 cm/s LVOT diam:     2.20 cm   LV E/e' lateral: 13.5 LV SV:         112 LV SV Index:   59 LVOT  Area:     3.80 cm  RIGHT VENTRICLE             IVC RV S prime:     15.90 cm/s  IVC diam: 1.20 cm TAPSE (M-mode): 2.2 cm LEFT ATRIUM           Index        RIGHT ATRIUM           Index LA diam:      3.10 cm 1.65 cm/m   RA Area:     11.90 cm LA Vol (A2C): 39.4 ml 20.96 ml/m  RA Volume:   21.50 ml  11.44 ml/m LA Vol (A4C): 58.0 ml 30.85 ml/m  AORTIC VALVE LVOT Vmax:   143.00 cm/s LVOT Vmean:  88.400 cm/s LVOT VTI:    0.294 m  AORTA Ao Root diam: 2.80 cm Ao Asc diam:  3.50 cm MITRAL VALVE               TRICUSPID VALVE MV Area (PHT): 4.08 cm    TR Peak grad:   20.2 mmHg MV Decel Time: 186 msec    TR Vmax:        225.00 cm/s MV E velocity: 91.30 cm/s MV A velocity: 97.40 cm/s  SHUNTS MV E/A ratio:  0.94  Systemic VTI:  0.29 m                            Systemic Diam: 2.20 cm Vinie Maxcy MD Electronically signed by Vinie Maxcy MD Signature Date/Time: 07/26/2024/2:30:31 PM    Final    NM Pulmonary Perfusion Result Date: 07/26/2024 EXAM: NM Lung Perfusion Scan. CLINICAL HISTORY: Rule out PE. 3.97 mCi Tc56m MAA IV Lt Hand @ 0951 MM; dose and pt verified w/ SW; Lung Perfusion Scan for chest pain; rule out PE; No hx of lung disease; was a smoker 15 years ago, but not currently; no hx of blood clot in lungs; EOV. TECHNIQUE: Radiolabeled MAA was administered intravenously and planar images of the lungs were obtained in multiple projections. RADIOPHARMACEUTICAL: 3.97 millicurie (technetium albumin  aggregated (MAA) injection solution 3.97 millicurie TECHNETIUM TO 36M ALBUMIN  AGGREGATED). COMPARISON: Chest radiograph from 07/25/2024. FINDINGS: PERFUSION: No segmental perfusion defect. IMPRESSION: 1. No perfusion defects to indicate pulmonary emboli. Electronically signed by: Waddell Calk MD 07/26/2024 12:36 PM EDT RP Workstation: [id number]   CT ABDOMEN PELVIS WO CONTRAST Result Date: 07/25/2024 CLINICAL DATA:  Bilateral lower quadrant pain EXAM: CT ABDOMEN AND PELVIS WITHOUT CONTRAST TECHNIQUE: Multidetector  CT imaging of the abdomen and pelvis was performed following the standard protocol without IV contrast. RADIATION DOSE REDUCTION: This exam was performed according to the departmental dose-optimization program which includes automated exposure control, adjustment of the mA and/or kV according to patient size and/or use of iterative reconstruction technique. COMPARISON:  CT 03/21/2024, 12/15/2023 FINDINGS: Lower chest: Lung bases demonstrate no acute airspace disease. Trace pericardial effusion. Small hiatal hernia Hepatobiliary: No focal liver abnormality is seen. No gallstones, gallbladder wall thickening, or biliary dilatation. Pancreas: Unremarkable. No pancreatic ductal dilatation or surrounding inflammatory changes. Spleen: Normal in size without focal abnormality. Adrenals/Urinary Tract: Adrenal glands are stable in appearance. Kidneys show no hydronephrosis. Thickened appearing right adrenal gland. The bladder is unremarkable Stomach/Bowel: Stomach within normal limits. No dilated small bowel. No acute bowel wall thickening. Diverticular disease of the left colon. Negative appendix Vascular/Lymphatic: Aortic atherosclerosis. No enlarged abdominal or pelvic lymph nodes. Reproductive: Uterus and bilateral adnexa are unremarkable. Other: Negative for pelvic effusion or free air. Small fat containing umbilical hernia Musculoskeletal: No acute osseous abnormality. Faint sclerotic lesions at T12, L2 and S1. IMPRESSION: 1. No CT evidence for acute intra-abdominal or pelvic abnormality. 2. Diverticular disease of the left colon without acute inflammatory process. 3. Aortic atherosclerosis. 4. Similar faint sclerotic lesions at T12, L2 and S1 corresponding to history of osseous metastatic disease Aortic Atherosclerosis (ICD10-I70.0). Electronically Signed   By: Luke Bun M.D.   On: 07/25/2024 19:38   DG Chest Port 1 View Result Date: 07/25/2024 CLINICAL DATA:  [id number] Surgery, elective [phone]. EXAM: PORTABLE  CHEST 1 VIEW COMPARISON:  05/03/2023. FINDINGS: Low lung volume. Bilateral lung fields are clear. Bilateral costophrenic angles are clear. Stable cardio-mediastinal silhouette. No acute osseous abnormalities. The soft tissues are within normal limits. IMPRESSION: No active disease. Electronically Signed   By: Ree Molt M.D.   On: 07/25/2024 14:18     Time coordinating discharge: Over 30 minutes    Alm Apo, MD  [facility] 07/26/2024, 4:03 PM

## 2024-07-27 ENCOUNTER — Other Ambulatory Visit (HOSPITAL_COMMUNITY): Payer: Self-pay

## 2024-07-27 ENCOUNTER — Other Ambulatory Visit: Payer: Self-pay | Admitting: Hematology and Oncology

## 2024-07-27 ENCOUNTER — Other Ambulatory Visit: Payer: Self-pay | Admitting: Nurse Practitioner

## 2024-07-27 DIAGNOSIS — E7800 Pure hypercholesterolemia, unspecified: Secondary | ICD-10-CM

## 2024-07-28 ENCOUNTER — Encounter (INDEPENDENT_AMBULATORY_CARE_PROVIDER_SITE_OTHER): Payer: Self-pay

## 2024-07-28 ENCOUNTER — Other Ambulatory Visit: Payer: Self-pay

## 2024-07-28 ENCOUNTER — Other Ambulatory Visit (HOSPITAL_COMMUNITY): Payer: Self-pay

## 2024-07-28 NOTE — Progress Notes (Signed)
 Specialty Pharmacy Refill Coordination Note  MyChart Questionnaire Submission  Michele Page is a 68 y.o. female contacted today regarding refills of specialty medication(s) Ibrance .  Doses on hand: (Patient-Rptd) 0   Patient requested: (Patient-Rptd) Delivery   Delivery date: 07/31/24  Verified address: [address] [name] [address]  Medication will be filled on 07/28/24.

## 2024-08-01 ENCOUNTER — Ambulatory Visit: Payer: Self-pay

## 2024-08-01 NOTE — Telephone Encounter (Signed)
 Patient has appointment tomorrow with Dr.Veludandi

## 2024-08-01 NOTE — Telephone Encounter (Signed)
 Noted thank you

## 2024-08-01 NOTE — Telephone Encounter (Signed)
 FYI Only or Action Required?: FYI only for provider.  Patient was last seen in primary care on 06/14/2024 by Medina-Vargas, Jereld BROCKS, NP.  Called Nurse Triage reporting Arm Swelling.  Symptoms began yesterday.  Interventions attempted: Nothing.  Symptoms are: gradually worsening.  Triage Disposition: See Physician Within 24 Hours  Patient/caregiver understands and will follow disposition?: Yes  Copied from CRM #[id number]. Topic: Clinical - Red Word Triage >> Aug 01, 2024 11:37 AM Merlynn LABOR wrote: Red Word that prompted transfer to Nurse Triage: Right arm swelling Reason for Disposition  MODERATE arm swelling (e.g., puffiness or swollen feeling of entire arm)  Answer Assessment - Initial Assessment Questions 1. ONSET: When did the swelling start? (e.g., minutes, hours, days)     Swelling likely yesterday  2. LOCATION: What part of the arm is swollen?  Are both arms swollen or just one arm?     Shoulder area with pain extending to hand, now hand looks a little swollen  3. SEVERITY: How bad is the swelling? (e.g., localized; mild, moderate, severe)     Moderate  4. REDNESS: Is there redness or signs of infection?     No  5. PAIN: Is the swelling painful to touch? If Yes, ask: How painful is it?   (Scale 1-10; mild, moderate or severe)     Pain when lifting, 8/10   6. FEVER: Do you have a fever? If Yes, ask: What is it, how was it measured, and when did it start?      No  7. CAUSE: What do you think is causing the arm swelling?     Believes it may be due to lifting at work  8. MEDICAL HISTORY: Do you have a history of heart failure, kidney disease, liver failure, or cancer?     CKD and breast cancer in both breast   9. RECURRENT SYMPTOM: Have you had arm swelling before? If Yes, ask: When was the last time? What happened that time?     No  10. OTHER SYMPTOMS: Do you have any other symptoms? (e.g., chest pain, difficulty breathing)       No  other symptoms  11. PREGNANCY: Is there any chance you are pregnant? When was your last menstrual period?       no  Protocols used: Arm Swelling and Edema-A-AH

## 2024-08-02 ENCOUNTER — Ambulatory Visit (INDEPENDENT_AMBULATORY_CARE_PROVIDER_SITE_OTHER): Admitting: Sports Medicine

## 2024-08-02 ENCOUNTER — Encounter: Payer: Self-pay | Admitting: Sports Medicine

## 2024-08-02 ENCOUNTER — Ambulatory Visit
Admission: RE | Admit: 2024-08-02 | Discharge: 2024-08-02 | Disposition: A | Discharge status: Home / Self Care | Source: Ambulatory Visit | Attending: Sports Medicine | Admitting: Sports Medicine

## 2024-08-02 VITALS — BP 138/88 | HR 76 | Temp 98.1°F | Ht 62.0 in

## 2024-08-02 DIAGNOSIS — M25511 Pain in right shoulder: Secondary | ICD-10-CM

## 2024-08-02 DIAGNOSIS — Z09 Encounter for follow-up examination after completed treatment for conditions other than malignant neoplasm: Secondary | ICD-10-CM

## 2024-08-02 DIAGNOSIS — R002 Palpitations: Secondary | ICD-10-CM

## 2024-08-02 DIAGNOSIS — M19011 Primary osteoarthritis, right shoulder: Secondary | ICD-10-CM | POA: Diagnosis not present

## 2024-08-02 MED ORDER — PREDNISONE 20 MG PO TABS: 40.0000 mg | ORAL_TABLET | Freq: Every day | ORAL | 0 refills | Status: DC

## 2024-08-02 NOTE — Progress Notes (Signed)
 Careteam: Patient Care Team: Caro Harlene POUR, NP as PCP - General (Geriatric Medicine) Court Dorn PARAS, MD as PCP - Cardiology (Cardiology) Odean Potts, MD as Consulting Physician (Oncology) Vanderbilt Ned, MD as Consulting Physician (General Surgery) Lucila Norleen LABOR, RPH-CPP as Pharmacist (Hematology and Oncology) Albertus, Gordy HERO, MD as Consulting Physician (Gastroenterology)  PLACE OF SERVICE:  [facility]  Advanced Directive information    No Known Allergies  No chief complaint on file.    Discussed the use of AI scribe software for clinical note transcription with the patient, who gave verbal consent to proceed.  History of Present Illness   Michele Page is a 68 year old female  presents with right arm swelling and pain.  She has been experiencing swelling and pain in her right arm since 1 month. The pain is constant, rated as 6 out of 10, and worsens with movement. It is localized to the shoulder area without radiation down the arm. There is no history of recent injury, fall, or trauma, but her work in maintenance involves frequent lifting.  She reports swelling in her right arm and has difficulty moving the arm, especially when lifting it overhead. She has not attempted any medications or home remedies for the pain.  No fever, chills, redness, tingling, or numbness in the affected arm. She denies dizziness, lightheadedness, nausea, vomiting, cough, congestion, breathing difficulties, or urinary issues. She denies experiencing systemic symptoms such as fever, chills, or malaise.  Her past medical history includes a stroke, which she believes may have affected her right side. She was recently hospitalized for chest pain and palpitations but has not experienced these symptoms since discharge.     Location- Rt shoulder  Duration - since  1 month ,  6/10, cannot describe the pain  Aggravating factors- with movements  Relieving factors- resting Medications  tried-none Mobility- pain with overhead lifting  Falls- none      Review of Systems:  Review of Systems  Constitutional:  Negative for chills and fever.  HENT:  Negative for congestion and sore throat.   Eyes:  Negative for double vision.  Respiratory:  Negative for cough, sputum production and shortness of breath.   Cardiovascular:  Negative for chest pain, palpitations and leg swelling.  Gastrointestinal:  Negative for abdominal pain, heartburn and nausea.  Genitourinary:  Negative for dysuria and hematuria.  Musculoskeletal:  Positive for joint pain. Negative for falls and myalgias.  Neurological:  Negative for dizziness.   Negative unless indicated in HPI.   Past Medical History:  Diagnosis Date   Anemia    past hx many yrs ago    Arthritis    lt knee   Breast cancer ([name])    Breast cancer of 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) 04/02/2016   Chronic kidney disease    CKD   Colon polyps    COPD (chronic obstructive pulmonary disease) ([name])    GERD (gastroesophageal reflux disease)    years ago- not current    Hyperlipidemia    Hypertension    Multiple sclerosis ([name])    Personal history of chemotherapy    Personal history of radiation therapy    Stroke ([name])    2013, mild cognitive deficits   Vision abnormalities    Past Surgical History:  Procedure Laterality Date   BREAST BIOPSY Left 03/31/2016   BREAST BIOPSY Right 07/24/2021   BREAST LUMPECTOMY Left 04/17/2016   BREAST LUMPECTOMY Right 09/02/2021   BREAST LUMPECTOMY WITH RADIOACTIVE SEED  AND SENTINEL LYMPH NODE BIOPSY Right 09/02/2021   Procedure: RIGHT BREAST LUMPECTOMY WITH RADIOACTIVE SEED AND SENTINEL LYMPH NODE BIOPSY;  Surgeon: Vanderbilt Ned, MD;  Location: [facility];  Service: General;  Laterality: Right;   COLONOSCOPY     HYSTEROSCOPY WITH D & C N/A 12/19/2015   Procedure: DILATATION AND CURETTAGE /HYSTEROSCOPY;  Surgeon: Duwaine Blumenthal, DO;  Location: [facility];  Service:  Gynecology;  Laterality: N/A;   POLYPECTOMY     PORTACATH PLACEMENT Right 10/16/2021   Procedure: INSERTION PORT-A-CATH;  Surgeon: Vanderbilt Ned, MD;  Location: [facility];  Service: General;  Laterality: Right;   RADIOACTIVE SEED GUIDED PARTIAL MASTECTOMY WITH AXILLARY SENTINEL LYMPH NODE BIOPSY Left 04/17/2016   Procedure: RADIOACTIVE SEED GUIDED PARTIAL MASTECTOMY WITH AXILLARY SENTINEL LYMPH NODE BIOPSY;  Surgeon: Ned Vanderbilt, MD;  Location: [facility];  Service: General;  Laterality: Left;  RADIOACTIVE SEED GUIDED PARTIAL MASTECTOMY WITH AXILLARY SENTINEL LYMPH NODE BIOPSY    TUBAL LIGATION     VAGINAL DELIVERY     x3   Social History:   reports that she quit smoking about 19 years ago. Her smoking use included cigarettes. She started smoking about 44 years ago. She has a 12.5 pack-year smoking history. She has never used smokeless tobacco. She reports current alcohol use. She reports that she does not use drugs.  Family History  Problem Relation Age of Onset   Hypertension Mother    Stroke Mother    Stroke Sister    Alcohol abuse Brother    HIV/AIDS Brother    Breast cancer Cousin        dx 5s   Colon cancer Neg Hx    Colon polyps Neg Hx    Esophageal cancer Neg Hx    Rectal cancer Neg Hx    Stomach cancer Neg Hx     Medications: Patient's Medications  New Prescriptions   No medications on file  Previous Medications   ALLOPURINOL  (ZYLOPRIM ) 100 MG TABLET    Take 0.5 tablets (50 mg total) by mouth every other day.   ASPIRIN  EC 81 MG TABLET    Take 81 mg by mouth daily. Swallow whole.   ATORVASTATIN  (LIPITOR) 20 MG TABLET    TAKE 1 TABLET EVERY DAY   LABETALOL  (NORMODYNE ) 300 MG TABLET    Take 2 tablets (600 mg total) by mouth 3 (three) times daily.   LETROZOLE  (FEMARA ) 2.5 MG TABLET    TAKE 1 TABLET EVERY DAY   NIFEDIPINE  (PROCARDIA  XL/NIFEDICAL XL) 60 MG 24 HR TABLET    Take 1 tablet (60 mg total) by mouth 2 (two) times daily.    OXYBUTYNIN  (DITROPAN ) 5 MG TABLET    Take 10 mg by mouth daily.   PALBOCICLIB  (IBRANCE ) 75 MG TABLET    Take 1 tablet (75 mg total) by mouth daily. Take for 21 days on, 7 days off, repeat every 28 days.  Modified Medications   No medications on file  Discontinued Medications   No medications on file    Physical Exam: There were no vitals filed for this visit. There is no height or weight on file to calculate BMI. BP Readings from Last 3 Encounters:  07/26/24 126/67  07/25/24 114/68  07/10/24 134/63   Wt Readings from Last 3 Encounters:  07/25/24 192 lb 3.9 oz (87.2 kg)  07/25/24 180 lb (81.6 kg)  07/10/24 192 lb 9.6 oz (87.4 kg)    Physical Exam Constitutional:  Appearance: Normal appearance.  HENT:     Head: Normocephalic and atraumatic.  Cardiovascular:     Rate and Rhythm: Normal rate and regular rhythm.  Pulmonary:     Effort: Pulmonary effort is normal. No respiratory distress.     Breath sounds: Normal breath sounds. No wheezing.  Abdominal:     General: Bowel sounds are normal. There is no distension.     Tenderness: There is no abdominal tenderness. There is no guarding or rebound.     Comments:    Musculoskeletal:        General: No swelling.     Comments: Rt shoulder - anterior tenderness Rom - abduction limited to 60 degree Empty can positive Good hand grip  No redness  mild swelling noted on dorsal aspect of Rt hand  Neurological:     Mental Status: She is alert. Mental status is at baseline.     Motor: No weakness.     Labs reviewed: Basic Metabolic Panel: Recent Labs    07/10/24 0955 07/25/24 1143 07/26/24 0600  NA 140 143 142  K 3.5 3.8 3.6  CL 108 106 107  CO2 24 21* 19*  GLUCOSE 104* 109* 91  BUN 25* 23 26*  CREATININE 2.10* 2.20* 2.03*  CALCIUM  9.0 9.9 9.5   Liver Function Tests: Recent Labs    04/05/24 0856 07/10/24 0955 07/25/24 1509  AST 17 18 20   ALT 10 12 12   ALKPHOS 105 103 120  BILITOT 0.5 0.3 0.5  PROT 7.7 6.6  7.1  ALBUMIN  4.7 4.2 4.3   Recent Labs    07/25/24 1143  LIPASE 22   No results for input(s): AMMONIA in the last 8760 hours. CBC: Recent Labs    06/14/24 1345 07/10/24 0955 07/25/24 1143 07/26/24 0600  WBC 3.7* 3.1* 2.9* 2.5*  NEUTROABS 2,664 1.8  --  1.4*  HGB 9.6* 9.4* 9.8* 9.2*  HCT 28.8* 27.2* 30.1* 27.9*  MCV 103.6* 100.4* 102.7* 104.9*  PLT 171 219 417* 358   Lipid Panel: No results for input(s): CHOL, HDL, LDLCALC, TRIG, CHOLHDL, LDLDIRECT in the last 8760 hours. TSH: No results for input(s): TSH in the last 8760 hours. A1C: Lab Results  Component Value Date   HGBA1C 5.8 (H) 05/13/2020    Assessment and Plan Assessment & Plan   1. Acute pain of right shoulder (Primary)  Rt shoulder pain since 1 month  No preceding trauma but reports that she need to do lifting heavy objects at work  O/E tenderness  anterior, posterior tenderness Empty can postive No redness, minimal dorsal puffiness of Rt hand  Will order X ray Rt shoulder Will give prednisone   Instructed to use lidocaine  patch, heating pad If no improvement follow up in clinic  - DG Shoulder Right; Future - predniSONE  (DELTASONE ) 20 MG tablet; Take 2 tablets (40 mg total) by mouth daily with breakfast.  Dispense: 10 tablet; Refill: 0         Hospital discharge follow-up  Palpitations Pt denies chest pain, palpitations

## 2024-08-08 ENCOUNTER — Ambulatory Visit: Payer: Self-pay | Admitting: Sports Medicine

## 2024-08-08 ENCOUNTER — Encounter: Payer: Self-pay | Admitting: Internal Medicine

## 2024-08-08 DIAGNOSIS — Z860100 Personal history of colon polyps, unspecified: Secondary | ICD-10-CM

## 2024-08-15 DIAGNOSIS — D649 Anemia, unspecified: Secondary | ICD-10-CM | POA: Diagnosis not present

## 2024-08-15 DIAGNOSIS — M109 Gout, unspecified: Secondary | ICD-10-CM | POA: Diagnosis not present

## 2024-08-15 DIAGNOSIS — N3281 Overactive bladder: Secondary | ICD-10-CM | POA: Diagnosis not present

## 2024-08-15 DIAGNOSIS — N184 Chronic kidney disease, stage 4 (severe): Secondary | ICD-10-CM | POA: Diagnosis not present

## 2024-08-15 DIAGNOSIS — E785 Hyperlipidemia, unspecified: Secondary | ICD-10-CM | POA: Diagnosis not present

## 2024-08-15 DIAGNOSIS — C50919 Malignant neoplasm of unspecified site of unspecified female breast: Secondary | ICD-10-CM | POA: Diagnosis not present

## 2024-08-15 DIAGNOSIS — I129 Hypertensive chronic kidney disease with stage 1 through stage 4 chronic kidney disease, or unspecified chronic kidney disease: Secondary | ICD-10-CM | POA: Diagnosis not present

## 2024-08-17 ENCOUNTER — Ambulatory Visit: Admitting: *Deleted

## 2024-08-17 VITALS — Ht 62.0 in | Wt 180.0 lb

## 2024-08-17 DIAGNOSIS — Z860100 Personal history of colon polyps, unspecified: Secondary | ICD-10-CM

## 2024-08-17 MED ORDER — NA SULFATE-K SULFATE-MG SULF 17.5-3.13-1.6 GM/177ML PO SOLN: 1.0000 | Freq: Once | ORAL | 0 refills | Status: AC

## 2024-08-17 NOTE — Progress Notes (Signed)
 Pt's name and DOB verified at the beginning of the pre-visit with 2 identifiers  Pt denies any difficulty with ambulating,sitting, laying down or rolling side to side  Pt has no issues moving head neck or swallowing  No egg or soy allergy known to patient   No issues known to pt with past sedation  No FH of Malignant Hyperthermia  Pt is not on home 02   Pt is not on blood thinners   Pt denies issues with constipation   Pt is not on dialysis  Pt denise any abnormal heart rhythms   Pt denies any upcoming cardiac testing  Chart not reviewed by CRNA prior to PV  Visit by phone  Pt states weight 180 lb  Pt given  both [facility] main # and MD on call # prior to instructions.  Informed pt to come in at the time discussed and is shown on PV instructions.  Pt instructed to use [employer] or GoodRx for a price reduction on prep  Instructed pt where to find PV instructions in My Ch. Copy of instructions  to be sent in mail and address read back to pt to verify correct on envelope. Instructed pt on all aspects of written instructions including med holds clothing to wear and foods to eat and not eat as well as after procedure legal restrictions and to call MD on call if needed.. Pt states understanding. Instructed pt to review instructions again prior to procedure and call main # given if has any questions or any issues. Pt states they will.

## 2024-08-18 ENCOUNTER — Encounter: Payer: Self-pay | Admitting: Internal Medicine

## 2024-08-21 ENCOUNTER — Other Ambulatory Visit: Payer: Self-pay

## 2024-08-21 ENCOUNTER — Other Ambulatory Visit (HOSPITAL_COMMUNITY): Payer: Self-pay

## 2024-08-21 NOTE — Progress Notes (Signed)
 Specialty Pharmacy Refill Coordination Note  MyChart Questionnaire Submission  Michele Page is a 68 y.o. female contacted today regarding refills of specialty medication(s) Ibrance .  Doses on hand: (Patient-Rptd) 1 pacakage   Patient requested: (Patient-Rptd) Delivery   Delivery date: 08/31/24  Verified address: [address] [name] [address]  Medication will be filled on 08/30/24.

## 2024-08-22 ENCOUNTER — Ambulatory Visit: Payer: Self-pay

## 2024-08-22 ENCOUNTER — Encounter: Payer: Self-pay | Admitting: Family

## 2024-08-22 ENCOUNTER — Encounter: Admitting: Physician Assistant

## 2024-08-22 ENCOUNTER — Ambulatory Visit (INDEPENDENT_AMBULATORY_CARE_PROVIDER_SITE_OTHER): Admitting: Family

## 2024-08-22 VITALS — BP 148/98 | HR 78 | Temp 97.7°F | Resp 20 | Ht 62.0 in | Wt 184.4 lb

## 2024-08-22 DIAGNOSIS — M25511 Pain in right shoulder: Secondary | ICD-10-CM

## 2024-08-22 DIAGNOSIS — M545 Low back pain, unspecified: Secondary | ICD-10-CM

## 2024-08-22 MED ORDER — PREDNISONE 20 MG PO TABS: 40.0000 mg | ORAL_TABLET | Freq: Every day | ORAL | 0 refills | Status: AC

## 2024-08-22 NOTE — Telephone Encounter (Signed)
 FYI Only or Action Required?: FYI only for provider.  Patient was last seen in primary care on 08/02/2024 by Sherlynn Madden, MD.  Called Nurse Triage reporting Back Pain.  Symptoms began 2 days ago.  Interventions attempted: OTC medications: Tylenol .  Symptoms are: stable.  Triage Disposition: See PCP When Office is Open (Within 3 Days)  Patient/caregiver understands and will follow disposition?: Yes Reason for Disposition  [1] MODERATE back pain (e.g., interferes with normal activities) AND [2] present > 3 days  Answer Assessment - Initial Assessment Questions 1. ONSET: When did the pain begin? (e.g., minutes, hours, days)     Sunday  2. LOCATION: Where does it hurt? (upper, mid or lower back)        All across lower back  3. SEVERITY: How bad is the pain?  (e.g., Scale 1-10; mild, moderate, or severe)     States its not that bad, but it hurts  4. PATTERN: Is the pain constant? (e.g., yes, no; constant, intermittent)      Hurts when moving a certain way  5. RADIATION: Does the pain shoot into your legs or somewhere else?     No  6. CAUSE:  What do you think is causing the back pain?      Unsure, feels like pulled something  7. BACK OVERUSE:  Any recent lifting of heavy objects, strenuous work or exercise?     Denies  8. MEDICINES: What have you taken so far for the pain? (e.g., nothing, acetaminophen , NSAIDS)     Taken Tylenol , but states not supposed to take Tylenol .  9. NEUROLOGIC SYMPTOMS: Do you have any weakness, numbness, or problems with bowel/bladder control?     Denies  10. OTHER SYMPTOMS: Do you have any other symptoms? (e.g., fever, abdomen pain, burning with urination, blood in urine)       Denies  11. PREGNANCY: Is there any chance you are pregnant? When was your last menstrual period?       N/A  Protocols used: Back Pain-A-AH  Copied from CRM [id number]. Topic: Clinical - Red Word Triage >> Aug 22, 2024 10:56 AM Susanna ORN wrote: Red Word that prompted transfer to Nurse Triage: Patient states she woke up and the bottom of her back is in pain. States she feels like she pulled it but she doesn't really know. States she can walk but she's just in pain. Wants to be seen today or tomorrow.

## 2024-08-22 NOTE — Progress Notes (Signed)
 Provider: Yeraldy Spike FNP-C  Caro Harlene POUR, NP  Patient Care Team: Caro Harlene POUR, NP as PCP - General (Geriatric Medicine) Court Dorn PARAS, MD as PCP - Cardiology (Cardiology) Odean Potts, MD as Consulting Physician (Oncology) Vanderbilt Ned, MD as Consulting Physician (General Surgery) Lucila Norleen LABOR, RPH-CPP as Pharmacist (Hematology and Oncology) Albertus, Gordy HERO, MD as Consulting Physician (Gastroenterology)  Extended Emergency Contact Information Primary Emergency Contact: [place],Russell Address: [address]          [place], [place] [address] [place] Home Phone: [phone] Mobile Phone: [phone] Relation: Spouse  Code Status:  Full Code  Goals of care: Advanced Directive information    08/22/2024    1:54 PM  Advanced Directives  Does Patient Have a Medical Advance Directive? No  Would patient like information on creating a medical advance directive? No - Patient declined     Chief Complaint  Patient presents with   Back Pain    Discussed the use of AI scribe software for clinical note transcription with the patient, who gave verbal consent to proceed.  History of Present Illness   Michele Page is a 68 year old female with hypertension who presents with back pain.  She has been experiencing back pain localized to the left lower back, radiating to the buttock but not extending to the leg. The pain is described as a heavy, straining sensation that worsens with walking and sitting. It began two days ago, likely due to moving heavy objects at work. No numbness, tingling, or swelling in the legs.  She recently completed a course of prednisone  for shoulder pain, which has mostly resolved, leaving only slight residual discomfort. She finished the prednisone  regimen two days ago.  She forgot her blood pressure medication today, resulting in elevated readings of 156/90 and 148/90. She typically takes her medication around the same  time each day.   Past Medical History:  Diagnosis Date   Anemia    past hx many yrs ago    Arthritis    lt knee   Breast cancer ([name])    Both breasts   Breast cancer of 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) 04/02/2016   Chronic kidney disease    CKD   Colon polyps    COPD (chronic obstructive pulmonary disease) ([name])    GERD (gastroesophageal reflux disease)    years ago- not current    Hyperlipidemia    Hypertension    Multiple sclerosis    Personal history of chemotherapy    Personal history of radiation therapy    Stroke ([name])    2013, mild cognitive deficits   Vision abnormalities    Past Surgical History:  Procedure Laterality Date   BREAST BIOPSY Left 03/31/2016   BREAST BIOPSY Right 07/24/2021   BREAST LUMPECTOMY Left 04/17/2016   BREAST LUMPECTOMY Right 09/02/2021   BREAST LUMPECTOMY WITH RADIOACTIVE SEED AND SENTINEL LYMPH NODE BIOPSY Right 09/02/2021   Procedure: RIGHT BREAST LUMPECTOMY WITH RADIOACTIVE SEED AND SENTINEL LYMPH NODE BIOPSY;  Surgeon: Vanderbilt Ned, MD;  Location: [facility];  Service: General;  Laterality: Right;   COLONOSCOPY     HYSTEROSCOPY WITH D & C N/A 12/19/2015   Procedure: DILATATION AND CURETTAGE /HYSTEROSCOPY;  Surgeon: Duwaine Blumenthal, DO;  Location: [facility];  Service: Gynecology;  Laterality: N/A;   POLYPECTOMY     PORTACATH PLACEMENT Right 10/16/2021   Procedure: INSERTION PORT-A-CATH;  Surgeon: Vanderbilt Ned, MD;  Location: [facility];  Service: General;  Laterality: Right;   RADIOACTIVE SEED GUIDED PARTIAL MASTECTOMY WITH AXILLARY SENTINEL LYMPH NODE BIOPSY Left 04/17/2016   Procedure: RADIOACTIVE SEED GUIDED PARTIAL MASTECTOMY WITH AXILLARY SENTINEL LYMPH NODE BIOPSY;  Surgeon: Debby Shipper, MD;  Location: [facility];  Service: General;  Laterality: Left;  RADIOACTIVE SEED GUIDED PARTIAL MASTECTOMY WITH AXILLARY SENTINEL LYMPH NODE BIOPSY    TUBAL LIGATION     VAGINAL  DELIVERY     x3    No Known Allergies  Outpatient Encounter Medications as of 08/22/2024  Medication Sig   allopurinol  (ZYLOPRIM ) 100 MG tablet Take 0.5 tablets (50 mg total) by mouth every other day.   aspirin  EC 81 MG tablet Take 81 mg by mouth daily. Swallow whole.   atorvastatin  (LIPITOR) 20 MG tablet TAKE 1 TABLET EVERY DAY   labetalol  (NORMODYNE ) 300 MG tablet Take 2 tablets (600 mg total) by mouth 3 (three) times daily.   letrozole  (FEMARA ) 2.5 MG tablet TAKE 1 TABLET EVERY DAY   NIFEdipine  (PROCARDIA  XL/NIFEDICAL XL) 60 MG 24 hr tablet Take 1 tablet (60 mg total) by mouth 2 (two) times daily.   oxybutynin  (DITROPAN ) 5 MG tablet Take 10 mg by mouth daily.   palbociclib  (IBRANCE ) 75 MG tablet Take 1 tablet (75 mg total) by mouth daily. Take for 21 days on, 7 days off, repeat every 28 days.   Na Sulfate-K Sulfate-Mg Sulfate concentrate (SUPREP) 17.5-3.13-1.6 GM/177ML SOLN as directed. (Patient not taking: Reported on 08/22/2024)   predniSONE  (DELTASONE ) 20 MG tablet Take 2 tablets (40 mg total) by mouth daily with breakfast for 5 days.   [DISCONTINUED] predniSONE  (DELTASONE ) 20 MG tablet Take 2 tablets (40 mg total) by mouth daily with breakfast. (Patient not taking: Reported on 08/22/2024)   No facility-administered encounter medications on file as of 08/22/2024.    Review of Systems  Constitutional:  Negative for appetite change, chills, fatigue and fever.  HENT:  Negative for congestion, dental problem, ear discharge, ear pain, facial swelling, hearing loss, nosebleeds, postnasal drip, rhinorrhea, sinus pressure, sinus pain, sneezing, sore throat and tinnitus.   Eyes:  Negative for pain, discharge, redness, itching and visual disturbance.  Respiratory:  Negative for cough, chest tightness, shortness of breath and wheezing.   Cardiovascular:  Negative for chest pain, palpitations and leg swelling.  Gastrointestinal:  Negative for abdominal distention, abdominal pain, constipation,  diarrhea, nausea and vomiting.  Genitourinary:  Negative for difficulty urinating, dysuria, flank pain, frequency and urgency.  Musculoskeletal:  Positive for arthralgias and back pain. Negative for gait problem, joint swelling, myalgias, neck pain and neck stiffness.       Left lower back pain   Skin:  Negative for color change, pallor and rash.  Neurological:  Negative for dizziness, weakness, numbness and headaches.    Immunization History  Administered Date(s) Administered   Influenza,inj,Quad PF,6+ Mos 08/07/2015, 08/06/2016, 11/24/2017, 10/26/2018, 07/12/2019   PPD Test 07/13/2017   Pneumococcal Conjugate-13 04/18/2021   Pertinent  Health Maintenance Due  Topic Date Due   Colonoscopy  01/14/2024   Influenza Vaccine  06/16/2024   Mammogram  02/22/2025   DEXA SCAN  04/21/2026      05/28/2023    2:40 PM 07/20/2023    1:07 PM 10/13/2023    1:57 PM 11/29/2023    1:06 PM 08/22/2024    1:54 PM  Fall Risk  Falls in the past year? 0 0 0 0 0  Was there an injury with Fall? 0 0 0 0  0  Fall Risk Category Calculator 0 0 0 0 0  Patient at Risk for Falls Due to No Fall Risks No Fall Risks No Fall Risks No Fall Risks No Fall Risks  Fall risk Follow up Falls evaluation completed Falls evaluation completed Falls evaluation completed Falls evaluation completed Falls evaluation completed   Functional Status Survey:    Vitals:   08/22/24 1357 08/22/24 1408  BP: (!) 156/90 (!) 148/98  Pulse: 78   Resp: 20   Temp: 97.7 F (36.5 C)   SpO2: 97%   Weight: 184 lb 6.4 oz (83.6 kg)   Height: 5' 2 (1.575 m)    Body mass index is 33.73 kg/m. Physical Exam VITALS: BP- 156/90 GENERAL: Alert, cooperative, well developed, no acute distress. HEENT: Normocephalic, normal oropharynx, moist mucous membranes. CHEST: Clear to auscultation bilaterally, no wheezes, rhonchi, or crackles. CARDIOVASCULAR: Normal heart rate and rhythm, S1 and S2 normal without murmurs. ABDOMEN: Soft, non-tender,  non-distended, without organomegaly, normal bowel sounds. EXTREMITIES: No cyanosis or edema. MUSCULOSKELETAL: Tenderness and soreness in left lower back. Hips and knees with normal range of motion, non-tender. NEUROLOGICAL: Cranial nerves grossly intact, moves all extremities without gross motor or sensory deficit.   Labs reviewed: Recent Labs    07/10/24 0955 07/25/24 1143 07/26/24 0600  NA 140 143 142  K 3.5 3.8 3.6  CL 108 106 107  CO2 24 21* 19*  GLUCOSE 104* 109* 91  BUN 25* 23 26*  CREATININE 2.10* 2.20* 2.03*  CALCIUM  9.0 9.9 9.5   Recent Labs    04/05/24 0856 07/10/24 0955 07/25/24 1509  AST 17 18 20   ALT 10 12 12   ALKPHOS 105 103 120  BILITOT 0.5 0.3 0.5  PROT 7.7 6.6 7.1  ALBUMIN  4.7 4.2 4.3   Recent Labs    06/14/24 1345 07/10/24 0955 07/25/24 1143 07/26/24 0600  WBC 3.7* 3.1* 2.9* 2.5*  NEUTROABS 2,664 1.8  --  1.4*  HGB 9.6* 9.4* 9.8* 9.2*  HCT 28.8* 27.2* 30.1* 27.9*  MCV 103.6* 100.4* 102.7* 104.9*  PLT 171 219 417* 358   Lab Results  Component Value Date   TSH 1.70 06/24/2018   Lab Results  Component Value Date   HGBA1C 5.8 (H) 05/13/2020   Lab Results  Component Value Date   CHOL 150 04/19/2023   HDL 75 04/19/2023   LDLCALC 57 04/19/2023   TRIG 92 04/19/2023   CHOLHDL 2.0 04/19/2023    Significant Diagnostic Results in last 30 days:  DG Shoulder Right Result Date: 08/07/2024 EXAM: 3 VIEW XRAY OF THE RIGHT SHOULDER 08/02/2024 03:03:38 PM COMPARISON: 05/24/2018 CLINICAL HISTORY: Rt shoulder pain. Chronic right shoulder pains, decreased ROM, Pain is now increasing, past hx of breast ca per pt, no injury to right shoulder. FINDINGS: BONES AND JOINTS: Degenerative subcortical cystic changes of humeral head. Mild glenohumeral and acromioclavicular joint osteoarthritis. SOFT TISSUES: Surgical clips in right axilla. Visualized lung is unremarkable. IMPRESSION: 1. Mild glenohumeral and acromioclavicular joint osteoarthritis. 2. Degenerative  subcortical cystic changes of the humeral head. Electronically signed by: Andrea Gasman MD 08/07/2024 01:01 PM EDT RP Workstation: [id number]   ECHOCARDIOGRAM COMPLETE Result Date: 07/26/2024    ECHOCARDIOGRAM REPORT   Patient Name:   Michele Page Date of Exam: 07/26/2024 Medical Rec #:  [id number]    Height:       62.0 in Accession #:    [id number]   Weight:       192.2 lb Date of Birth:  09-23-1956  BSA:          1.880 m Patient Age:    67 years     BP:           134/64 mmHg Patient Gender: F            HR:           74 bpm. Exam Location:  Inpatient Procedure: 2D Echo, Cardiac Doppler and Color Doppler (Both Spectral and Color            Flow Doppler were utilized during procedure). Indications:    Chest Pain R07.9  History:        Patient has no prior history of Echocardiogram examinations.                 Stroke; Risk Factors:Hypertension and Dyslipidemia.  Sonographer:    Tinnie Gosling RDCS Referring Phys: [phone] TIMOTHY S OPYD IMPRESSIONS  1. Left ventricular ejection fraction, by estimation, is 65 to 70%. The left ventricle has normal function. The left ventricle has no regional wall motion abnormalities. There is mild left ventricular hypertrophy. Left ventricular diastolic parameters are consistent with Grade I diastolic dysfunction (impaired relaxation).  2. Right ventricular systolic function is normal. The right ventricular size is normal. There is normal pulmonary artery systolic pressure. The estimated right ventricular systolic pressure is 23.2 mmHg.  3. The mitral valve is grossly normal. Trivial mitral valve regurgitation.  4. The aortic valve is tricuspid. Aortic valve regurgitation is not visualized.  5. The inferior vena cava is normal in size with greater than 50% respiratory variability, suggesting right atrial pressure of 3 mmHg.  6. Cannot exclude a small PFO. Comparison(s): No prior Echocardiogram. FINDINGS  Left Ventricle: Left ventricular ejection fraction, by estimation, is 65 to  70%. The left ventricle has normal function. The left ventricle has no regional wall motion abnormalities. The left ventricular internal cavity size was normal in size. There is  mild left ventricular hypertrophy. Left ventricular diastolic parameters are consistent with Grade I diastolic dysfunction (impaired relaxation). Indeterminate filling pressures. Right Ventricle: The right ventricular size is normal. No increase in right ventricular wall thickness. Right ventricular systolic function is normal. There is normal pulmonary artery systolic pressure. The tricuspid regurgitant velocity is 2.25 m/s, and  with an assumed right atrial pressure of 3 mmHg, the estimated right ventricular systolic pressure is 23.2 mmHg. Left Atrium: Left atrial size was normal in size. Right Atrium: Right atrial size was normal in size. Pericardium: There is no evidence of pericardial effusion. Mitral Valve: The mitral valve is grossly normal. Trivial mitral valve regurgitation. Tricuspid Valve: The tricuspid valve is grossly normal. Tricuspid valve regurgitation is mild. Aortic Valve: The aortic valve is tricuspid. Aortic valve regurgitation is not visualized. Pulmonic Valve: The pulmonic valve was normal in structure. Pulmonic valve regurgitation is not visualized. Aorta: The aortic root and ascending aorta are structurally normal, with no evidence of dilitation. Venous: The inferior vena cava is normal in size with greater than 50% respiratory variability, suggesting right atrial pressure of 3 mmHg. IAS/Shunts: Cannot exclude a small PFO.  LEFT VENTRICLE PLAX 2D LVIDd:         4.80 cm   Diastology LVIDs:         3.00 cm   LV e' medial:    5.66 cm/s LV PW:         1.20 cm   LV E/e' medial:  16.1 LV IVS:        1.10  cm   LV e' lateral:   6.74 cm/s LVOT diam:     2.20 cm   LV E/e' lateral: 13.5 LV SV:         112 LV SV Index:   59 LVOT Area:     3.80 cm  RIGHT VENTRICLE             IVC RV S prime:     15.90 cm/s  IVC diam: 1.20 cm  TAPSE (M-mode): 2.2 cm LEFT ATRIUM           Index        RIGHT ATRIUM           Index LA diam:      3.10 cm 1.65 cm/m   RA Area:     11.90 cm LA Vol (A2C): 39.4 ml 20.96 ml/m  RA Volume:   21.50 ml  11.44 ml/m LA Vol (A4C): 58.0 ml 30.85 ml/m  AORTIC VALVE LVOT Vmax:   143.00 cm/s LVOT Vmean:  88.400 cm/s LVOT VTI:    0.294 m  AORTA Ao Root diam: 2.80 cm Ao Asc diam:  3.50 cm MITRAL VALVE               TRICUSPID VALVE MV Area (PHT): 4.08 cm    TR Peak grad:   20.2 mmHg MV Decel Time: 186 msec    TR Vmax:        225.00 cm/s MV E velocity: 91.30 cm/s MV A velocity: 97.40 cm/s  SHUNTS MV E/A ratio:  0.94        Systemic VTI:  0.29 m                            Systemic Diam: 2.20 cm Vinie Maxcy MD Electronically signed by Vinie Maxcy MD Signature Date/Time: 07/26/2024/2:30:31 PM    Final    NM Pulmonary Perfusion Result Date: 07/26/2024 EXAM: NM Lung Perfusion Scan. CLINICAL HISTORY: Rule out PE. 3.97 mCi Tc90m MAA IV Lt Hand @ 0951 MM; dose and pt verified w/ SW; Lung Perfusion Scan for chest pain; rule out PE; No hx of lung disease; was a smoker 15 years ago, but not currently; no hx of blood clot in lungs; EOV. TECHNIQUE: Radiolabeled MAA was administered intravenously and planar images of the lungs were obtained in multiple projections. RADIOPHARMACEUTICAL: 3.97 millicurie (technetium albumin  aggregated (MAA) injection solution 3.97 millicurie TECHNETIUM TO 25M ALBUMIN  AGGREGATED). COMPARISON: Chest radiograph from 07/25/2024. FINDINGS: PERFUSION: No segmental perfusion defect. IMPRESSION: 1. No perfusion defects to indicate pulmonary emboli. Electronically signed by: Waddell Calk MD 07/26/2024 12:36 PM EDT RP Workstation: [id number]   CT ABDOMEN PELVIS WO CONTRAST Result Date: 07/25/2024 CLINICAL DATA:  Bilateral lower quadrant pain EXAM: CT ABDOMEN AND PELVIS WITHOUT CONTRAST TECHNIQUE: Multidetector CT imaging of the abdomen and pelvis was performed following the standard protocol without IV  contrast. RADIATION DOSE REDUCTION: This exam was performed according to the departmental dose-optimization program which includes automated exposure control, adjustment of the mA and/or kV according to patient size and/or use of iterative reconstruction technique. COMPARISON:  CT 03/21/2024, 12/15/2023 FINDINGS: Lower chest: Lung bases demonstrate no acute airspace disease. Trace pericardial effusion. Small hiatal hernia Hepatobiliary: No focal liver abnormality is seen. No gallstones, gallbladder wall thickening, or biliary dilatation. Pancreas: Unremarkable. No pancreatic ductal dilatation or surrounding inflammatory changes. Spleen: Normal in size without focal abnormality. Adrenals/Urinary Tract: Adrenal glands are stable in appearance. Kidneys show no hydronephrosis. Thickened  appearing right adrenal gland. The bladder is unremarkable Stomach/Bowel: Stomach within normal limits. No dilated small bowel. No acute bowel wall thickening. Diverticular disease of the left colon. Negative appendix Vascular/Lymphatic: Aortic atherosclerosis. No enlarged abdominal or pelvic lymph nodes. Reproductive: Uterus and bilateral adnexa are unremarkable. Other: Negative for pelvic effusion or free air. Small fat containing umbilical hernia Musculoskeletal: No acute osseous abnormality. Faint sclerotic lesions at T12, L2 and S1. IMPRESSION: 1. No CT evidence for acute intra-abdominal or pelvic abnormality. 2. Diverticular disease of the left colon without acute inflammatory process. 3. Aortic atherosclerosis. 4. Similar faint sclerotic lesions at T12, L2 and S1 corresponding to history of osseous metastatic disease Aortic Atherosclerosis (ICD10-I70.0). Electronically Signed   By: Luke Bun M.D.   On: 07/25/2024 19:38   DG Chest Port 1 View Result Date: 07/25/2024 CLINICAL DATA:  [id number] Surgery, elective [phone]. EXAM: PORTABLE CHEST 1 VIEW COMPARISON:  05/03/2023. FINDINGS: Low lung volume. Bilateral lung fields are clear.  Bilateral costophrenic angles are clear. Stable cardio-mediastinal silhouette. No acute osseous abnormalities. The soft tissues are within normal limits. IMPRESSION: No active disease. Electronically Signed   By: Ree Molt M.D.   On: 07/25/2024 14:18    Assessment/Plan  Low back pain with left buttock involvement Acute low back pain localized to the left lower back and buttock, likely due to recent physical activity and strain. No radiation to the leg, suggesting it is not sciatica. Pain is exacerbated by walking and sitting, and described as a straining type of pain. Recent use of prednisone  for shoulder pain was effective, suggesting inflammation as a component of the pain. - Prescribe prednisone  40 mg daily for 5 days to reduce inflammation. - Advise to complete prednisone  course before scheduled colonoscopy on August 31, 2024. - Recommend stretching exercises to alleviate muscle strain and prevent nerve inflammation. - Instruct to avoid lifting with the back and use leg muscles instead.  Hypertension Elevated blood pressure readings today at 156/90 mmHg and 148/90 mmHg, likely due to missed dose of antihypertensive medication. She typically takes medication around this time daily. - Instruct to take missed blood pressure medication upon returning home. - Advise to monitor blood pressure at home and report if readings remain in the 140s despite medication adherence.   Family/ staff Communication: Reviewed plan of care with patient verbalized understanding   Labs/tests ordered: None   Next Appointment: Return if symptoms worsen or fail to improve.  Total time: 20 minutes. Greater than 50% of total time spent doing patient education regarding HTN, left lower back pain,health maintenance including symptom/medication management.   Roxan JAYSON Plough, NP

## 2024-08-23 ENCOUNTER — Encounter (HOSPITAL_BASED_OUTPATIENT_CLINIC_OR_DEPARTMENT_OTHER): Payer: Self-pay

## 2024-08-24 ENCOUNTER — Ambulatory Visit (INDEPENDENT_AMBULATORY_CARE_PROVIDER_SITE_OTHER): Admitting: Family

## 2024-08-24 ENCOUNTER — Encounter (HOSPITAL_BASED_OUTPATIENT_CLINIC_OR_DEPARTMENT_OTHER): Payer: Self-pay | Admitting: Family

## 2024-08-24 VITALS — BP 154/85 | HR 88 | Ht 62.0 in | Wt 190.0 lb

## 2024-08-24 DIAGNOSIS — I10 Essential (primary) hypertension: Secondary | ICD-10-CM | POA: Diagnosis not present

## 2024-08-24 DIAGNOSIS — Z79899 Other long term (current) drug therapy: Secondary | ICD-10-CM | POA: Diagnosis not present

## 2024-08-24 MED ORDER — CARVEDILOL 25 MG PO TABS: 25.0000 mg | ORAL_TABLET | Freq: Two times a day (BID) | ORAL | 3 refills | Status: AC

## 2024-08-24 MED ORDER — NIFEDIPINE ER OSMOTIC RELEASE 90 MG PO TB24: 90.0000 mg | ORAL_TABLET | Freq: Every day | ORAL | 1 refills | Status: AC

## 2024-08-24 NOTE — Progress Notes (Unsigned)
 [facility] Initial Assessment:    Date:  08/24/2024   ID:  Michele Page, DOB 1956-08-27, MRN [id number]  PCP:  Caro Harlene POUR, NP  Cardiologist:  Dorn Lesches, MD  Nephrologist: Dr. Norine  Referring MD: Caro Harlene POUR, NP   CC: Hypertension  History of Present Illness:    Michele Page is a 68 y.o. female with a hx of HTN, gout, breast cancer, insomnia *** here to establish care in the [facility].   Michele Page was diagnosed with hypertension in her late 36s. She did have high blood pressure with her pregnancies. It has been difficult to control. Blood pressure checked with arm cuff at home. Readings have been labile. Systolic BP 120s-190s. BP elevated midday into the evening in particular. she reports tobacco use previously having quit in 2006. Alcohol use goes to the gym and does cardio and strength training. No chest pain, exertional dyspnea with exercise. For exercise she ***. she eats at home and does follow low sodium diet. Drinks an occasional cup of coffee.   Nifepedinine 10pm Labetolol 1pm, 4pm, 7-8pm.   When mentioning Labetolol I gotta get rid of that  Reports her BP has been excellent up to 6 months ago. She attributes to the elevated blood pressure to her blood pressure medications. I cannot take that anymore feels she has developed a dependency on the medication and her blood pressure elevates shortly after her blood pressure pill.   :I Know there is nothing wrong with my heart.   90mg  tablet of Nifedipine  Its not good enough  The only way to keep it down I have to have a strong blood pressure pill  She endorses feeling Crazy when her blood pressure is high.   She also feels the high blood pressure Speeds her high up.   ACE/ARB previously deferred due to CKD   Adrenals/Urinary Tract: Adrenal glands are stable in appearance. Kidneys show no hydronephrosis. Thickened appearing right adrenal gland.  The bladder is unremarkable  CT 07/2024  She reiterates she needs a larger dose - tried to explain that a larger dose is not always stronger  Previous antihypertensives: Hydralazine  - ineffective Losartan   Secondary Causes of Hypertension  Medications/Herbal: OCP, steroids, stimulants, antidepressants, weight loss medication, immune suppressants, NSAIDs, sympathomimetics, alcohol, caffeine, licorice, ginseng, St. John's wort, chemo  Sleep Apnea Renal artery stenosis Hyperaldosteronism Hyper/hypothyroidism Pheochromocytoma: palpitations, tachycardia, headache, diaphoresis (plasma metanephrines) Cushing's syndrome: Cushingoid facies, central obesity, proximal muscle weakness, and ecchymoses, adrenal incidentaloma (cortisol) Coarctation of the aorta  Past Medical History:  Diagnosis Date   Anemia    past hx many yrs ago    Arthritis    lt knee   Breast cancer ([name])    Both breasts   Breast cancer of 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) 04/02/2016   Chronic kidney disease    CKD   Colon polyps    COPD (chronic obstructive pulmonary disease) ([name])    GERD (gastroesophageal reflux disease)    years ago- not current    Hyperlipidemia    Hypertension    Multiple sclerosis    Personal history of chemotherapy    Personal history of radiation therapy    Stroke ([name])    2013, mild cognitive deficits   Vision abnormalities     Past Surgical History:  Procedure Laterality Date   BREAST BIOPSY Left 03/31/2016   BREAST BIOPSY Right 07/24/2021   BREAST LUMPECTOMY Left 04/17/2016   BREAST LUMPECTOMY Right  09/02/2021   BREAST LUMPECTOMY WITH RADIOACTIVE SEED AND SENTINEL LYMPH NODE BIOPSY Right 09/02/2021   Procedure: RIGHT BREAST LUMPECTOMY WITH RADIOACTIVE SEED AND SENTINEL LYMPH NODE BIOPSY;  Surgeon: Vanderbilt Ned, MD;  Location: [facility];  Service: General;  Laterality: Right;   COLONOSCOPY     HYSTEROSCOPY WITH D & C N/A 12/19/2015   Procedure:  DILATATION AND CURETTAGE /HYSTEROSCOPY;  Surgeon: Duwaine Blumenthal, DO;  Location: [facility];  Service: Gynecology;  Laterality: N/A;   POLYPECTOMY     PORTACATH PLACEMENT Right 10/16/2021   Procedure: INSERTION PORT-A-CATH;  Surgeon: Vanderbilt Ned, MD;  Location: [facility];  Service: General;  Laterality: Right;   RADIOACTIVE SEED GUIDED PARTIAL MASTECTOMY WITH AXILLARY SENTINEL LYMPH NODE BIOPSY Left 04/17/2016   Procedure: RADIOACTIVE SEED GUIDED PARTIAL MASTECTOMY WITH AXILLARY SENTINEL LYMPH NODE BIOPSY;  Surgeon: Ned Vanderbilt, MD;  Location: [facility];  Service: General;  Laterality: Left;  RADIOACTIVE SEED GUIDED PARTIAL MASTECTOMY WITH AXILLARY SENTINEL LYMPH NODE BIOPSY    TUBAL LIGATION     VAGINAL DELIVERY     x3    Current Medications: Current Meds  Medication Sig   allopurinol  (ZYLOPRIM ) 100 MG tablet Take 0.5 tablets (50 mg total) by mouth every other day.   aspirin  EC 81 MG tablet Take 81 mg by mouth daily. Swallow whole.   atorvastatin  (LIPITOR) 20 MG tablet TAKE 1 TABLET EVERY DAY   labetalol  (NORMODYNE ) 300 MG tablet Take 2 tablets (600 mg total) by mouth 3 (three) times daily.   letrozole  (FEMARA ) 2.5 MG tablet TAKE 1 TABLET EVERY DAY   Na Sulfate-K Sulfate-Mg Sulfate concentrate (SUPREP) 17.5-3.13-1.6 GM/177ML SOLN as directed.   NIFEdipine  (PROCARDIA  XL/NIFEDICAL XL) 60 MG 24 hr tablet Take 1 tablet (60 mg total) by mouth 2 (two) times daily.   oxybutynin  (DITROPAN ) 5 MG tablet Take 10 mg by mouth daily.   palbociclib  (IBRANCE ) 75 MG tablet Take 1 tablet (75 mg total) by mouth daily. Take for 21 days on, 7 days off, repeat every 28 days.   predniSONE  (DELTASONE ) 20 MG tablet Take 2 tablets (40 mg total) by mouth daily with breakfast for 5 days.     Allergies:   Patient has no known allergies.   Social History   Socioeconomic History   Marital status: Married    Spouse name: Not on file   Number of children: 3   Years of education:  Not on file   Highest education level: Associate degree: academic program  Occupational History   Occupation: retired  Tobacco Use   Smoking status: Former    Current packs/day: 0.00    Average packs/day: 0.5 packs/day for 25.0 years (12.5 ttl pk-yrs)    Types: Cigarettes    Start date: 11/17/1979    Quit date: 11/16/2004    Years since quitting: 19.7   Smokeless tobacco: Never  Vaping Use   Vaping status: Never Used  Substance and Sexual Activity   Alcohol use: Yes    Comment: Occassionally.   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   Exercise- Yes   Living will-No   DNR-N/A   POA/HPOA-No  Works part-time at [employer]: Patient Declined (06/05/2024)   Overall Financial Resource Strain (CARDIA)    Difficulty of Paying Living Expenses: Patient declined  Food Insecurity: No Food Insecurity (07/25/2024)   Hunger Vital Sign    Worried About Running Out of Food in the Last Year: Never true    Ran Out of Food in the Last Year: Never true  Transportation Needs: No Transportation Needs (07/25/2024)   PRAPARE - Administrator, Civil Service (Medical): No    Lack of Transportation (Non-Medical): No  Physical Activity: Insufficiently Active (06/05/2024)   Exercise Vital Sign    Days of Exercise per Week: 3 days    Minutes of Exercise per Session: 30 min  Stress: No Stress Concern Present (06/05/2024)   [employer] of Occupational Health - Occupational Stress Questionnaire    Feeling of Stress: Not at all  Social Connections: Moderately Integrated (07/25/2024)   Social Connection and Isolation Panel    Frequency of Communication with Friends and Family: More than three times a  week    Frequency of Social Gatherings with Friends and Family: Never    Attends Religious Services: More than 4 times per year    Active Member of [employer] or Organizations: No    Attends Engineer, structural: Never    Marital Status: Married     Family History: The patient's family history includes Alcohol abuse in her brother; Breast cancer in her cousin; HIV/AIDS in her brother; Hypertension in her mother; Stroke in her mother and sister. There is no history of Colon cancer, Colon polyps, Esophageal cancer, Rectal cancer, or Stomach cancer.  ROS:   Please see the history of present illness.     All other systems reviewed and are negative.  EKGs/Labs/Other Studies Reviewed:         Recent Labs: 07/25/2024: ALT 12 07/26/2024: BUN 26; Creatinine, Ser 2.03; Hemoglobin 9.2; Platelets 358; Potassium 3.6; Sodium 142   Recent Lipid Panel    Component Value Date/Time   CHOL 150 04/19/2023 1332   CHOL 196 05/13/2016 1127   TRIG 92 04/19/2023 1332   HDL 75 04/19/2023 1332   HDL 64 05/13/2016 1127   CHOLHDL 2.0 04/19/2023 1332   VLDL 22 05/26/2017 0932   LDLCALC 57 04/19/2023 1332    Physical Exam:   VS:  BP (!) 154/85 (BP Location: Left Arm)   Pulse 88   Ht 5' 2 (1.575 m)   Wt 190 lb (86.2 kg)   SpO2 97%   BMI 34.75 kg/m  , BMI Body mass index is 34.75 kg/m. GENERAL:  Well appearing HEENT: Pupils equal round and reactive, fundi not visualized, oral mucosa unremarkable NECK:  No jugular venous distention, waveform within normal limits, carotid upstroke brisk and symmetric, no bruits, no thyromegaly LYMPHATICS:  No cervical adenopathy LUNGS:  Clear to auscultation bilaterally HEART:  RRR.  PMI not displaced or sustained,S1 and S2 within normal limits, no S3, no S4, no clicks, no rubs, *** murmurs ABD:  Flat, positive bowel sounds normal in frequency in pitch, no bruits, no rebound, no guarding, no midline pulsatile mass, no hepatomegaly, no splenomegaly EXT:  2 plus  pulses throughout, no edema, no cyanosis no clubbing SKIN:  No rashes no nodules NEURO:  Cranial nerves II through XII grossly intact, motor grossly intact throughout PSYCH:  Cognitively intact, oriented to person place and time   ASSESSMENT/PLAN:    HTN -  Screening for Secondary Hypertension: { Click here to document screening for secondary causes of HTN  :[id number]}    Relevant Labs/Studies:    Latest Ref Rng & Units 07/26/2024    6:00 AM 07/25/2024   11:43 AM 07/10/2024    9:55 AM  Basic Labs  Sodium 135 - 145 mmol/L 142  143  140   Potassium 3.5 - 5.1 mmol/L 3.6  3.8  3.5   Creatinine 0.44 - 1.00 mg/dL 7.96  7.79  7.89        Latest Ref Rng & Units 06/24/2018   10:45 AM 05/26/2017    9:32 AM  Thyroid    TSH 0.40 - 4.50 mIU/L 1.70  1.37                 08/16/2020    9:38 AM  Renovascular   Renal Artery US  Completed Yes     she *** interested in enrolling in the PREP exercise and nutrition program through the [facility].     Disposition:    FU with MD/APP/PharmD in {gen number 9-89:689602} {Days to years:10300}    Medication Adjustments/Labs and Tests Ordered: Current medicines are reviewed at length with the patient today.  Concerns regarding medicines are outlined above.  No orders of the defined types were placed in this encounter.  No orders of the defined types were placed in this encounter.    Signed, Reche GORMAN Finder, NP  08/24/2024 2:24 PM    [facility]

## 2024-08-24 NOTE — Patient Instructions (Addendum)
 Medication Instructions:  STOP Labetolol  START Carvedilol 25mg  twice daily Take about 12 hours apart  CHANGE Nifedipine  90mg  once daily   Labwork: Your physician recommends that you return for lab work today   Testing/Procedures: Your physician has requested that you have a renal artery duplex. During this test, an ultrasound is used to evaluate blood flow to the kidneys. Allow one hour for this exam. Do not eat after midnight the day before and avoid carbonated beverages. Take your medications as you usually do.    Follow-Up: Please follow up in 1 month in [facility] with Dr. Raford, Reche Finder, NP or Allean Mink PharmD    Special Instructions:   We will send you a MyChart message in 1 week to check in Tips to Measure your Blood Pressure Correctly  To determine whether you have hypertension, a medical professional will take a blood pressure reading. How you prepare for the test, the position of your arm, and other factors can change a blood pressure reading by 10% or more. That could be enough to hide high blood pressure, start you on a drug you don't really need, or lead your doctor to incorrectly adjust your medications.  National and international guidelines offer specific instructions for measuring blood pressure. If a doctor, nurse, or medical assistant isn't doing it right, don't hesitate to ask him or her to get with the guidelines.  Here's what you can do to ensure a correct reading:  Don't drink a caffeinated beverage or smoke during the 30 minutes before the test.  Sit quietly for five minutes before the test begins.  During the measurement, sit in a chair with your feet on the floor and your arm supported so your elbow is at about heart level.  The inflatable part of the cuff should completely cover at least 80% of your upper arm, and the cuff should be placed on bare skin, not over a shirt.  Don't talk during the measurement.  Have your blood pressure  measured twice, with a brief break in between. If the readings are different by 5 points or more, have it done a third time.  In 2017, new guidelines from the American Heart Association, the [employer] of Cardiology, and nine other health organizations lowered the diagnosis of high blood pressure to 130/80 mm Hg or higher for all adults. The guidelines also redefined the various blood pressure categories to now include normal, elevated, Stage 1 hypertension, Stage 2 hypertension, and hypertensive crisis (see Blood pressure categories).  Blood pressure categories  Blood pressure category SYSTOLIC (upper number)  DIASTOLIC (lower number)  Normal Less than 120 mm Hg and Less than 80 mm Hg  Elevated 120-129 mm Hg and Less than 80 mm Hg  High blood pressure: Stage 1 hypertension 130-139 mm Hg or 80-89 mm Hg  High blood pressure: Stage 2 hypertension 140 mm Hg or higher or 90 mm Hg or higher  Hypertensive crisis (consult your doctor immediately) Higher than 180 mm Hg and/or Higher than 120 mm Hg  Source: American Heart Association and American Stroke Association. For more on getting your blood pressure under control, buy Controlling Your Blood Pressure, a Special Health Report from [facility].

## 2024-08-25 ENCOUNTER — Encounter (HOSPITAL_BASED_OUTPATIENT_CLINIC_OR_DEPARTMENT_OTHER): Payer: Self-pay

## 2024-08-25 ENCOUNTER — Ambulatory Visit (HOSPITAL_BASED_OUTPATIENT_CLINIC_OR_DEPARTMENT_OTHER): Payer: Self-pay | Admitting: Family

## 2024-08-25 LAB — TSH: TSH: 0.506 u[IU]/mL (ref 0.450–4.500)

## 2024-08-29 ENCOUNTER — Other Ambulatory Visit: Payer: Self-pay

## 2024-08-29 NOTE — Progress Notes (Signed)
 Specialty Pharmacy Ongoing Clinical Assessment Note  Michele Page is a 68 y.o. female who is being followed by the specialty pharmacy service for RxSp Oncology   Patient's specialty medication(s) reviewed today: Palbociclib  (IBRANCE )   Missed doses in the last 4 weeks: 0   Patient/Caregiver did not have any additional questions or concerns.   Therapeutic benefit summary: Patient is achieving benefit   Adverse events/side effects summary: No adverse events/side effects   Patient's therapy is appropriate to: Continue    Goals Addressed             This Visit's Progress    Slow Disease Progression   On track    Patient is on track. Patient will maintain adherence and adhere to provider and/or lab appointments. Patient has upcoming scans in November and follow up with provider in December.         Follow up: 6 months  [name] M Colt Martelle Specialty Pharmacist

## 2024-08-30 ENCOUNTER — Other Ambulatory Visit: Payer: Self-pay

## 2024-08-31 ENCOUNTER — Ambulatory Visit (AMBULATORY_SURGERY_CENTER): Admitting: Internal Medicine

## 2024-08-31 VITALS — BP 155/86 | HR 91 | Temp 97.2°F | Resp 22 | Ht 62.0 in | Wt 180.0 lb

## 2024-08-31 DIAGNOSIS — Z1211 Encounter for screening for malignant neoplasm of colon: Secondary | ICD-10-CM

## 2024-08-31 DIAGNOSIS — D124 Benign neoplasm of descending colon: Secondary | ICD-10-CM

## 2024-08-31 DIAGNOSIS — Z860100 Personal history of colon polyps, unspecified: Secondary | ICD-10-CM

## 2024-08-31 DIAGNOSIS — D123 Benign neoplasm of transverse colon: Secondary | ICD-10-CM

## 2024-08-31 DIAGNOSIS — K635 Polyp of colon: Secondary | ICD-10-CM

## 2024-08-31 DIAGNOSIS — K5730 Diverticulosis of large intestine without perforation or abscess without bleeding: Secondary | ICD-10-CM | POA: Diagnosis not present

## 2024-08-31 DIAGNOSIS — E785 Hyperlipidemia, unspecified: Secondary | ICD-10-CM | POA: Diagnosis not present

## 2024-08-31 DIAGNOSIS — J449 Chronic obstructive pulmonary disease, unspecified: Secondary | ICD-10-CM | POA: Diagnosis not present

## 2024-08-31 DIAGNOSIS — D125 Benign neoplasm of sigmoid colon: Secondary | ICD-10-CM | POA: Diagnosis not present

## 2024-08-31 DIAGNOSIS — G35D Multiple sclerosis, unspecified: Secondary | ICD-10-CM | POA: Diagnosis not present

## 2024-08-31 DIAGNOSIS — I10 Essential (primary) hypertension: Secondary | ICD-10-CM | POA: Diagnosis not present

## 2024-08-31 MED ORDER — SODIUM CHLORIDE 0.9 % IV SOLN: 500.0000 mL | INTRAVENOUS | Status: DC

## 2024-08-31 NOTE — Progress Notes (Signed)
 Called to room to assist during endoscopic procedure.  Patient ID and intended procedure confirmed with present staff. Received instructions for my participation in the procedure from the performing physician.

## 2024-08-31 NOTE — Op Note (Signed)
 [facility] Patient Name: Michele Page Procedure Date: 08/31/2024 11:37 AM MRN: [id number] Endoscopist: Gordy CHRISTELLA Starch , MD, [id number] Age: 68 Referring MD:  Date of Birth: December 10, 1955 Gender: Female Account #: [id number] Procedure:                Colonoscopy Indications:              High risk colon cancer surveillance: Personal                            history of multiple adenomas, Last colonoscopy:                            February 2022 (7 TAs), Aug 2016 (TA x 3) Medicines:                Monitored Anesthesia Care Procedure:                Pre-Anesthesia Assessment:                           - Prior to the procedure, a History and Physical                            was performed, and patient medications and                            allergies were reviewed. The patient's tolerance of                            previous anesthesia was also reviewed. The risks                            and benefits of the procedure and the sedation                            options and risks were discussed with the patient.                            All questions were answered, and informed consent                            was obtained. Prior Anticoagulants: The patient has                            taken no anticoagulant or antiplatelet agents. ASA                            Grade Assessment: III - A patient with severe                            systemic disease. After reviewing the risks and                            benefits, the patient was deemed in satisfactory  condition to undergo the procedure.                           After obtaining informed consent, the colonoscope                            was passed under direct vision. Throughout the                            procedure, the patient's blood pressure, pulse, and                            oxygen saturations were monitored continuously. The                            [id number]-HQ190L Colonoscope  [id number] was introduced                            through the anus and advanced to the cecum,                            identified by appendiceal orifice and ileocecal                            valve. The colonoscopy was performed without                            difficulty. The patient tolerated the procedure                            well. The quality of the bowel preparation was                            good. The ileocecal valve, appendiceal orifice, and                            rectum were photographed. Scope In: 11:56:03 AM Scope Out: 12:12:57 PM Scope Withdrawal Time: 0 hours 10 minutes 54 seconds  Total Procedure Duration: 0 hours 16 minutes 54 seconds  Findings:                 The digital rectal exam was normal.                           A 5 mm polyp was found in the hepatic flexure. The                            polyp was sessile. The polyp was removed with a                            cold snare. Resection and retrieval were complete.                           A 6 mm polyp was found in the descending colon. The  polyp was sessile. The polyp was removed with a                            cold snare. Resection and retrieval were complete.                           A 3 mm polyp was found in the sigmoid colon. The                            polyp was sessile and located between two                            diverticula. The polyp was removed with a cold                            biopsy forceps. Resection and retrieval were                            complete.                           Multiple large-mouthed, medium-mouthed and                            small-mouthed diverticula were found in the sigmoid                            colon, descending colon, transverse colon and                            ascending colon.                           The retroflexed view of the distal rectum and anal                            verge was normal and  showed no anal or rectal                            abnormalities. Complications:            No immediate complications. Estimated Blood Loss:     Estimated blood loss was minimal. Impression:               - One 5 mm polyp at the hepatic flexure, removed                            with a cold snare. Resected and retrieved.                           - One 6 mm polyp in the descending colon, removed                            with a cold snare. Resected and retrieved.                           -  One 3 mm polyp in the sigmoid colon, removed with                            a cold biopsy forceps. Resected and retrieved.                           - Severe diverticulosis in the sigmoid colon, in                            the descending colon, in the transverse colon and                            in the ascending colon.                           - The distal rectum and anal verge are normal on                            retroflexion view. Recommendation:           - Patient has a contact number available for                            emergencies. The signs and symptoms of potential                            delayed complications were discussed with the                            patient. Return to normal activities tomorrow.                            Written discharge instructions were provided to the                            patient.                           - Resume previous diet.                           - Continue present medications.                           - Await pathology results.                           - Repeat colonoscopy is recommended for                            surveillance. The colonoscopy date will be                            determined after pathology results from today's  exam become available for review. Gordy CHRISTELLA Starch, MD 08/31/2024 12:16:43 PM This report has been signed electronically.

## 2024-08-31 NOTE — Progress Notes (Signed)
 Pt's states no medical or surgical changes since previsit or office visit.

## 2024-08-31 NOTE — Progress Notes (Signed)
 Vss nad trans to pacu

## 2024-08-31 NOTE — Patient Instructions (Signed)
 Handouts given: Polyps, Diverticulosis Resume previous diet. Continue present medications.  Await pathology results. Repeat colonoscopy is recommended for surveillance based on pathology results.   YOU HAD AN ENDOSCOPIC PROCEDURE TODAY AT THE [facility]:   Refer to the procedure report that was given to you for any specific questions about what was found during the examination.  If the procedure report does not answer your questions, please call your gastroenterologist to clarify.  If you requested that your care partner not be given the details of your procedure findings, then the procedure report has been included in a sealed envelope for you to review at your convenience later.  YOU SHOULD EXPECT: Some feelings of bloating in the abdomen. Passage of more gas than usual.  Walking can help get rid of the air that was put into your GI tract during the procedure and reduce the bloating. If you had a lower endoscopy (such as a colonoscopy or flexible sigmoidoscopy) you may notice spotting of blood in your stool or on the toilet paper. If you underwent a bowel prep for your procedure, you may not have a normal bowel movement for a few days.  Please Note:  You might notice some irritation and congestion in your nose or some drainage.  This is from the oxygen used during your procedure.  There is no need for concern and it should clear up in a day or so.  SYMPTOMS TO REPORT IMMEDIATELY:  Following lower endoscopy (colonoscopy or flexible sigmoidoscopy):  Excessive amounts of blood in the stool  Significant tenderness or worsening of abdominal pains  Swelling of the abdomen that is new, acute  Fever of 100F or higher  For urgent or emergent issues, a gastroenterologist can be reached at any hour by calling [phone]. Do not use MyChart messaging for urgent concerns.    DIET:  We do recommend a small meal at first, but then you may proceed to your regular diet.  Drink plenty of  fluids but you should avoid alcoholic beverages for 24 hours.  ACTIVITY:  You should plan to take it easy for the rest of today and you should NOT DRIVE or use heavy machinery until tomorrow (because of the sedation medicines used during the test).    FOLLOW UP: Our staff will call the number listed on your records the next business day following your procedure.  We will call around 7:15- 8:00 am to check on you and address any questions or concerns that you may have regarding the information given to you following your procedure. If we do not reach you, we will leave a message.     If any biopsies were taken you will be contacted by phone or by letter within the next 1-3 weeks.  Please call us  at [phone] if you have not heard about the biopsies in 3 weeks.    SIGNATURES/CONFIDENTIALITY: You and/or your care partner have signed paperwork which will be entered into your electronic medical record.  These signatures attest to the fact that that the information above on your After Visit Summary has been reviewed and is understood.  Full responsibility of the confidentiality of this discharge information lies with you and/or your care-partner.

## 2024-08-31 NOTE — Progress Notes (Signed)
 GASTROENTEROLOGY PROCEDURE H&P NOTE   Primary Care Physician: Caro Harlene POUR, NP    Reason for Procedure:   Hx of polyps  Plan:    colonoscopy  Patient is appropriate for endoscopic procedure(s) in the ambulatory ([facility]) setting.  The nature of the procedure, as well as the risks, benefits, and alternatives were carefully and thoroughly reviewed with the patient. Ample time for discussion and questions allowed. The patient understood, was satisfied, and agreed to proceed.     HPI: Michele Page is a 68 y.o. female who presents for surveillance colonoscopy.  Medical history as below.  Tolerated the prep.  No recent chest pain or shortness of breath.  No abdominal pain today.  Past Medical History:  Diagnosis Date   Anemia    past hx many yrs ago    Arthritis    lt knee   Breast cancer ([name])    Both breasts   Breast cancer of 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) 04/02/2016   Chronic kidney disease    CKD   Colon polyps    COPD (chronic obstructive pulmonary disease) ([name])    GERD (gastroesophageal reflux disease)    years ago- not current    Hyperlipidemia    Hypertension    Multiple sclerosis    Personal history of chemotherapy    Personal history of radiation therapy    Stroke ([name])    2013, mild cognitive deficits   Vision abnormalities     Past Surgical History:  Procedure Laterality Date   BREAST BIOPSY Left 03/31/2016   BREAST BIOPSY Right 07/24/2021   BREAST LUMPECTOMY Left 04/17/2016   BREAST LUMPECTOMY Right 09/02/2021   BREAST LUMPECTOMY WITH RADIOACTIVE SEED AND SENTINEL LYMPH NODE BIOPSY Right 09/02/2021   Procedure: RIGHT BREAST LUMPECTOMY WITH RADIOACTIVE SEED AND SENTINEL LYMPH NODE BIOPSY;  Surgeon: Vanderbilt Ned, MD;  Location: [facility];  Service: General;  Laterality: Right;   COLONOSCOPY     HYSTEROSCOPY WITH D & C N/A 12/19/2015   Procedure: DILATATION AND CURETTAGE /HYSTEROSCOPY;  Surgeon: Duwaine Blumenthal, DO;   Location: [facility];  Service: Gynecology;  Laterality: N/A;   POLYPECTOMY     PORTACATH PLACEMENT Right 10/16/2021   Procedure: INSERTION PORT-A-CATH;  Surgeon: Vanderbilt Ned, MD;  Location: [facility];  Service: General;  Laterality: Right;   RADIOACTIVE SEED GUIDED PARTIAL MASTECTOMY WITH AXILLARY SENTINEL LYMPH NODE BIOPSY Left 04/17/2016   Procedure: RADIOACTIVE SEED GUIDED PARTIAL MASTECTOMY WITH AXILLARY SENTINEL LYMPH NODE BIOPSY;  Surgeon: Ned Vanderbilt, MD;  Location: [facility];  Service: General;  Laterality: Left;  RADIOACTIVE SEED GUIDED PARTIAL MASTECTOMY WITH AXILLARY SENTINEL LYMPH NODE BIOPSY    TUBAL LIGATION     VAGINAL DELIVERY     x3    Prior to Admission medications   Medication Sig Start Date End Date Taking? Authorizing Provider  aspirin  EC 81 MG tablet Take 81 mg by mouth daily. Swallow whole.   Yes [provider]  atorvastatin  (LIPITOR) 20 MG tablet TAKE 1 TABLET EVERY DAY 07/27/24  Yes Eubanks, Jessica K, NP  carvedilol (COREG) 25 MG tablet Take 1 tablet (25 mg total) by mouth 2 (two) times daily. 08/24/24  Yes Vannie Reche RAMAN, NP  letrozole  (FEMARA ) 2.5 MG tablet TAKE 1 TABLET EVERY DAY 07/27/24  Yes Gudena, Vinay, MD  NIFEdipine  (PROCARDIA  XL/NIFEDICAL-XL) 90 MG 24 hr tablet Take 1 tablet (90 mg total) by mouth daily. 08/24/24  Yes Vannie Reche RAMAN, NP  oxybutynin  (DITROPAN ) 5 MG tablet Take 10 mg by mouth daily. 05/29/24  Yes [provider]  palbociclib  (IBRANCE ) 75 MG tablet Take 1 tablet (75 mg total) by mouth daily. Take for 21 days on, 7 days off, repeat every 28 days. 06/08/24  Yes Gudena, Vinay, MD  allopurinol  (ZYLOPRIM ) 100 MG tablet Take 0.5 tablets (50 mg total) by mouth every other day. 05/25/24   Eubanks, Jessica K, NP    Current Outpatient Medications  Medication Sig Dispense Refill   aspirin  EC 81 MG tablet Take 81 mg by mouth daily. Swallow whole.     atorvastatin  (LIPITOR) 20 MG tablet TAKE 1  TABLET EVERY DAY 90 tablet 3   carvedilol (COREG) 25 MG tablet Take 1 tablet (25 mg total) by mouth 2 (two) times daily. 180 tablet 3   letrozole  (FEMARA ) 2.5 MG tablet TAKE 1 TABLET EVERY DAY 90 tablet 3   NIFEdipine  (PROCARDIA  XL/NIFEDICAL-XL) 90 MG 24 hr tablet Take 1 tablet (90 mg total) by mouth daily. 90 tablet 1   oxybutynin  (DITROPAN ) 5 MG tablet Take 10 mg by mouth daily.     palbociclib  (IBRANCE ) 75 MG tablet Take 1 tablet (75 mg total) by mouth daily. Take for 21 days on, 7 days off, repeat every 28 days. 21 tablet 3   allopurinol  (ZYLOPRIM ) 100 MG tablet Take 0.5 tablets (50 mg total) by mouth every other day. 45 tablet 0   Current Facility-Administered Medications  Medication Dose Route Frequency Provider Last Rate Last Admin   0.9 %  sodium chloride  infusion  500 mL Intravenous Continuous Tito Ausmus, Gordy HERO, MD        Allergies as of 08/31/2024   (No Known Allergies)    Family History  Problem Relation Age of Onset   Hypertension Mother    Stroke Mother    Stroke Sister    Alcohol abuse Brother    HIV/AIDS Brother    Breast cancer Cousin        dx 29s   Colon cancer Neg Hx    Colon polyps Neg Hx    Esophageal cancer Neg Hx    Rectal cancer Neg Hx    Stomach cancer Neg Hx     Social History   Socioeconomic History   Marital status: Married    Spouse name: Not on file   Number of children: 3   Years of education: Not on file   Highest education level: Some college, no degree  Occupational History   Occupation: retired  Tobacco Use   Smoking status: Former    Current packs/day: 0.00    Average packs/day: 0.5 packs/day for 25.0 years (12.5 ttl pk-yrs)    Types: Cigarettes    Start date: 11/17/1979    Quit date: 11/16/2004    Years since quitting: 19.8   Smokeless tobacco: Never  Vaping Use   Vaping status: Never Used  Substance and Sexual Activity   Alcohol use: Yes    Comment: Occassionally.   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   Exercise- Yes   Living will-No   DNR-N/A   POA/HPOA-No      Works part-time at [employer]  Social Drivers of Corporate investment banker Strain: Low Risk  (08/31/2024)   Overall Financial Resource Strain (CARDIA)    Difficulty of Paying Living Expenses: Not very hard  Food Insecurity: No Food Insecurity (08/31/2024)   Hunger Vital Sign    Worried About Running Out of Food in the Last Year: Never true    Ran Out of Food in the Last Year: Never true  Transportation Needs: Unknown (08/31/2024)   PRAPARE - Administrator, Civil Service (Medical): No    Lack of Transportation (Non-Medical): Not on file  Physical Activity: Insufficiently Active (08/31/2024)   Exercise Vital Sign    Days of Exercise per Week: 2 days    Minutes of Exercise per Session: 20 min  Stress: No Stress Concern Present (08/31/2024)   [employer] of Occupational Health - Occupational Stress Questionnaire    Feeling of Stress: Not at all  Social Connections: Moderately Integrated (08/31/2024)   Social Connection and Isolation Panel    Frequency of Communication with Friends and Family: Once a week    Frequency of Social Gatherings with Friends and Family: Once a week    Attends Religious Services: More than 4 times per year    Active Member of [employer] or Organizations: Yes    Attends Banker Meetings: 1 to 4 times per year    Marital Status: Married  Catering manager Violence: Not At Risk (07/25/2024)   Humiliation, Afraid, Rape, and Kick questionnaire    Fear of Current or Ex-Partner: No    Emotionally Abused: No    Physically Abused: No    Sexually Abused: No    Physical Exam: Vital signs in last 24 hours: @BP  (!) 166/91   Pulse 90   Temp (!) 97.2 F (36.2  C) (Temporal)   Ht 5' 2 (1.575 m)   Wt 180 lb (81.6 kg)   SpO2 98%   BMI 32.92 kg/m  GEN: NAD EYE: Sclerae anicteric ENT: MMM CV: Non-tachycardic Pulm: CTA b/l GI: Soft, NT/ND NEURO:  Alert & Oriented x 3   Gordy Starch, MD [facility]  08/31/2024 11:40 AM

## 2024-09-01 ENCOUNTER — Telehealth: Payer: Self-pay

## 2024-09-01 NOTE — Telephone Encounter (Signed)
  Follow up Call-     08/31/2024   10:59 AM  Call back number  Post procedure Call Back phone  # [phone]  Permission to leave phone message Yes     Patient questions:  Do you have a fever, pain , or abdominal swelling? No. Pain Score  0 *  Have you tolerated food without any problems? Yes.    Have you been able to return to your normal activities? Yes.    Do you have any questions about your discharge instructions: Diet   No. Medications  No. Follow up visit  No.  Do you have questions or concerns about your Care? No.  Actions: * If pain score is 4 or above: No action needed, pain <4.

## 2024-09-03 LAB — ALDOSTERONE + RENIN ACTIVITY W/ RATIO
Aldos/Renin Ratio: 66 — ABNORMAL HIGH (ref 0.0–30.0)
Aldosterone: 41.3 ng/dL — ABNORMAL HIGH (ref 0.0–30.0)
Renin Activity, Plasma: 0.626 ng/mL/h (ref 0.167–5.380)

## 2024-09-04 ENCOUNTER — Telehealth: Payer: Self-pay | Admitting: *Deleted

## 2024-09-04 ENCOUNTER — Ambulatory Visit: Admitting: Nurse Practitioner

## 2024-09-04 ENCOUNTER — Encounter: Payer: Self-pay | Admitting: Nurse Practitioner

## 2024-09-04 ENCOUNTER — Ambulatory Visit: Payer: Self-pay | Admitting: Internal Medicine

## 2024-09-04 VITALS — BP 130/82 | HR 63 | Temp 98.1°F | Ht 62.0 in | Wt 192.2 lb

## 2024-09-04 DIAGNOSIS — N3281 Overactive bladder: Secondary | ICD-10-CM

## 2024-09-04 DIAGNOSIS — M25511 Pain in right shoulder: Secondary | ICD-10-CM | POA: Diagnosis not present

## 2024-09-04 DIAGNOSIS — C50411 Malignant neoplasm of upper-outer quadrant of right female breast: Secondary | ICD-10-CM | POA: Diagnosis not present

## 2024-09-04 DIAGNOSIS — N184 Chronic kidney disease, stage 4 (severe): Secondary | ICD-10-CM | POA: Diagnosis not present

## 2024-09-04 DIAGNOSIS — M545 Low back pain, unspecified: Secondary | ICD-10-CM

## 2024-09-04 DIAGNOSIS — F5101 Primary insomnia: Secondary | ICD-10-CM | POA: Diagnosis not present

## 2024-09-04 DIAGNOSIS — M1A0420 Idiopathic chronic gout, left hand, without tophus (tophi): Secondary | ICD-10-CM

## 2024-09-04 DIAGNOSIS — I10 Essential (primary) hypertension: Secondary | ICD-10-CM | POA: Diagnosis not present

## 2024-09-04 DIAGNOSIS — Z170 Estrogen receptor positive status [ER+]: Secondary | ICD-10-CM

## 2024-09-04 DIAGNOSIS — E7800 Pure hypercholesterolemia, unspecified: Secondary | ICD-10-CM

## 2024-09-04 LAB — LIPID PANEL
Cholesterol: 146 mg/dL (ref <200)
HDL: 66 mg/dL (ref >=50)
LDL Cholesterol (Calc): 62 mg/dL
Non-HDL Cholesterol (Calc): 80 mg/dL (ref <130)
Total CHOL/HDL Ratio: 2.2 (calc) (ref <5.0)
Triglycerides: 95 mg/dL (ref <150)

## 2024-09-04 LAB — SURGICAL PATHOLOGY

## 2024-09-04 LAB — URIC ACID: Uric Acid, Serum: 8.1 mg/dL — ABNORMAL HIGH (ref 2.5–7.0)

## 2024-09-04 MED ORDER — ZOLPIDEM TARTRATE 5 MG PO TABS: 5.0000 mg | ORAL_TABLET | Freq: Every evening | ORAL | 1 refills | Status: DC | PRN

## 2024-09-04 NOTE — Progress Notes (Signed)
 Careteam: Patient Care Team: Caro Harlene POUR, NP as PCP - General (Geriatric Medicine) Court Dorn PARAS, MD as PCP - Cardiology (Cardiology) Odean Potts, MD as Consulting Physician (Oncology) Vanderbilt Ned, MD as Consulting Physician (General Surgery) Lucila Norleen LABOR, RPH-CPP as Pharmacist (Hematology and Oncology) Albertus, Gordy HERO, MD as Consulting Physician (Gastroenterology)  PLACE OF SERVICE:  [facility]  Advanced Directive information    No Known Allergies  Chief Complaint  Patient presents with   Medical Management of Chronic Issues    3 month routine follow up.  Patient stated right upper arm is very painful and has a lump  Also patient believed she sprained her lower back on the right side sometimes it hurts when working.  Also would like something for her to be able to sleep Right hand swollen but no pain Patient declined flu vaccine.     HPI:  Discussed the use of AI scribe software for clinical note transcription with the patient, who gave verbal consent to proceed.  History of Present Illness Michele Page is a 68 year old female who presents for a routine follow-up.  She has been experiencing persistent right shoulder pain for approximately two months. The pain is localized to the right upper arm and is described as a difficulty in bending the arm with a sensation of something hard that 'won't move'. She has not seen an orthopedic specialist recently but recalls seeing one in the past for similar issues. She was previously prescribed prednisone , which did not alleviate the arm pain but helped with her low back pain.  She describes a recent onset of low back pain, which began about two weeks ago after reaching for something and experiencing a pull. The pain is localized to the lower back and does not radiate down the leg. She rates the pain as an 8 out of 10 but continues to work despite the discomfort. She has not used heat or Tylenol  for the pain but wants  pain relief. No numbness, tingling, or incontinence.  She has a history of breast cancer and on femara  and ibrance . Continues to follow up with her oncologist.  She saw cardiology to help managing hypertension and was changed carvedilol 25 mg twice daily and nifedipine  90 mg daily. She reports previous chest pains, which she attributes to changes in her blood pressure medication has resolved and  She is scheduled to follow up with her cardiologist next month.  She is taking oxybutynin  for overactive bladder and is doing well with it.  She experiences insomnia, sleeping only two to three hours per night, and has previously used Ambien  (zolpidem ) for sleep, which she found effective without side effects.  She has chronic kidney disease stage 4 and drinks plenty of water.   She takes allopurinol  every other day for gout and reports no recent flares.   Review of Systems:  Review of Systems  Constitutional:  Negative for chills, fever and weight loss.  HENT:  Negative for tinnitus.   Respiratory:  Negative for cough, sputum production and shortness of breath.   Cardiovascular:  Negative for chest pain, palpitations and leg swelling.  Gastrointestinal:  Negative for abdominal pain, constipation, diarrhea and heartburn.  Genitourinary:  Negative for dysuria, frequency and urgency.  Musculoskeletal:  Positive for back pain, joint pain and myalgias. Negative for falls.  Skin: Negative.   Neurological:  Positive for weakness. Negative for dizziness and headaches.  Psychiatric/Behavioral:  Negative for depression and memory loss. The patient has  insomnia.     Past Medical History:  Diagnosis Date   Anemia    past hx many yrs ago    Arthritis    lt knee   Breast cancer ([name])    Both breasts   Breast cancer of 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) 04/02/2016   Chronic kidney disease    CKD   Colon polyps    COPD (chronic obstructive pulmonary disease) ([name])    GERD  (gastroesophageal reflux disease)    years ago- not current    Hyperlipidemia    Hypertension    Multiple sclerosis    Personal history of chemotherapy    Personal history of radiation therapy    Stroke ([name])    2013, mild cognitive deficits   Vision abnormalities    Past Surgical History:  Procedure Laterality Date   BREAST BIOPSY Left 03/31/2016   BREAST BIOPSY Right 07/24/2021   BREAST LUMPECTOMY Left 04/17/2016   BREAST LUMPECTOMY Right 09/02/2021   BREAST LUMPECTOMY WITH RADIOACTIVE SEED AND SENTINEL LYMPH NODE BIOPSY Right 09/02/2021   Procedure: RIGHT BREAST LUMPECTOMY WITH RADIOACTIVE SEED AND SENTINEL LYMPH NODE BIOPSY;  Surgeon: Vanderbilt Ned, MD;  Location: [facility];  Service: General;  Laterality: Right;   COLONOSCOPY     HYSTEROSCOPY WITH D & C N/A 12/19/2015   Procedure: DILATATION AND CURETTAGE /HYSTEROSCOPY;  Surgeon: Duwaine Blumenthal, DO;  Location: [facility];  Service: Gynecology;  Laterality: N/A;   POLYPECTOMY     PORTACATH PLACEMENT Right 10/16/2021   Procedure: INSERTION PORT-A-CATH;  Surgeon: Vanderbilt Ned, MD;  Location: [facility];  Service: General;  Laterality: Right;   RADIOACTIVE SEED GUIDED PARTIAL MASTECTOMY WITH AXILLARY SENTINEL LYMPH NODE BIOPSY Left 04/17/2016   Procedure: RADIOACTIVE SEED GUIDED PARTIAL MASTECTOMY WITH AXILLARY SENTINEL LYMPH NODE BIOPSY;  Surgeon: Ned Vanderbilt, MD;  Location: [facility];  Service: General;  Laterality: Left;  RADIOACTIVE SEED GUIDED PARTIAL MASTECTOMY WITH AXILLARY SENTINEL LYMPH NODE BIOPSY    TUBAL LIGATION     VAGINAL DELIVERY     x3   Social History:   reports that she quit smoking about 19 years ago. Her smoking use included cigarettes. She started smoking about 44 years ago. She has a 12.5 pack-year smoking history. She has never used smokeless tobacco. She reports current alcohol use. She reports that she does not use drugs.  Family History  Problem Relation  Age of Onset   Hypertension Mother    Stroke Mother    Stroke Sister    Alcohol abuse Brother    HIV/AIDS Brother    Breast cancer Cousin        dx 45s   Colon cancer Neg Hx    Colon polyps Neg Hx    Esophageal cancer Neg Hx    Rectal cancer Neg Hx    Stomach cancer Neg Hx     Medications: Patient's Medications  New Prescriptions   No medications on file  Previous Medications   ALLOPURINOL  (ZYLOPRIM ) 100 MG TABLET    Take 0.5 tablets (50 mg total) by mouth every other day.   ASPIRIN  EC 81 MG TABLET    Take 81 mg by mouth daily. Swallow whole.   ATORVASTATIN  (LIPITOR) 20 MG TABLET    TAKE 1 TABLET EVERY DAY   CARVEDILOL (COREG) 25 MG TABLET    Take 1 tablet (25 mg total) by mouth 2 (two) times daily.   LETROZOLE  (FEMARA ) 2.5 MG TABLET    TAKE  1 TABLET EVERY DAY   NIFEDIPINE  (PROCARDIA  XL/NIFEDICAL-XL) 90 MG 24 HR TABLET    Take 1 tablet (90 mg total) by mouth daily.   OXYBUTYNIN  (DITROPAN ) 5 MG TABLET    Take 10 mg by mouth daily.   PALBOCICLIB  (IBRANCE ) 75 MG TABLET    Take 1 tablet (75 mg total) by mouth daily. Take for 21 days on, 7 days off, repeat every 28 days.  Modified Medications   No medications on file  Discontinued Medications   No medications on file    Physical Exam:  Vitals:   09/04/24 1255  BP: 130/82  Pulse: 63  Temp: 98.1 F (36.7 C)  TempSrc: Temporal  SpO2: 92%  Weight: 192 lb 3.2 oz (87.2 kg)  Height: 5' 2 (1.575 m)   Body mass index is 35.15 kg/m. Wt Readings from Last 3 Encounters:  09/04/24 192 lb 3.2 oz (87.2 kg)  08/31/24 180 lb (81.6 kg)  08/24/24 190 lb (86.2 kg)    Physical Exam Constitutional:      General: She is not in acute distress.    Appearance: She is well-developed. She is not diaphoretic.  HENT:     Head: Normocephalic and atraumatic.     Mouth/Throat:     Pharynx: No oropharyngeal exudate.  Eyes:     Conjunctiva/sclera: Conjunctivae normal.     Pupils: Pupils are equal, round, and reactive to light.   Cardiovascular:     Rate and Rhythm: Normal rate and regular rhythm.     Heart sounds: Normal heart sounds.  Pulmonary:     Effort: Pulmonary effort is normal.     Breath sounds: Normal breath sounds.  Abdominal:     General: Bowel sounds are normal.     Palpations: Abdomen is soft.  Musculoskeletal:     Right shoulder: Tenderness present. Decreased range of motion. Decreased strength.     Cervical back: Normal range of motion and neck supple.     Lumbar back: Tenderness present.       Back:     Right lower leg: No edema.     Left lower leg: No edema.  Skin:    General: Skin is warm and dry.  Neurological:     Mental Status: She is alert.  Psychiatric:        Mood and Affect: Mood normal.     Labs reviewed: Basic Metabolic Panel: Recent Labs    07/10/24 0955 07/25/24 1143 07/26/24 0600 08/24/24 1519  NA 140 143 142  --   K 3.5 3.8 3.6  --   CL 108 106 107  --   CO2 24 21* 19*  --   GLUCOSE 104* 109* 91  --   BUN 25* 23 26*  --   CREATININE 2.10* 2.20* 2.03*  --   CALCIUM  9.0 9.9 9.5  --   TSH  --   --   --  0.506   Liver Function Tests: Recent Labs    04/05/24 0856 07/10/24 0955 07/25/24 1509  AST 17 18 20   ALT 10 12 12   ALKPHOS 105 103 120  BILITOT 0.5 0.3 0.5  PROT 7.7 6.6 7.1  ALBUMIN  4.7 4.2 4.3   Recent Labs    07/25/24 1143  LIPASE 22   No results for input(s): AMMONIA in the last 8760 hours. CBC: Recent Labs    06/14/24 1345 07/10/24 0955 07/25/24 1143 07/26/24 0600  WBC 3.7* 3.1* 2.9* 2.5*  NEUTROABS 2,664 1.8  --  1.4*  HGB 9.6* 9.4* 9.8* 9.2*  HCT 28.8* 27.2* 30.1* 27.9*  MCV 103.6* 100.4* 102.7* 104.9*  PLT 171 219 417* 358   Lipid Panel: No results for input(s): CHOL, HDL, LDLCALC, TRIG, CHOLHDL, LDLDIRECT in the last 8760 hours. TSH: Recent Labs    08/24/24 1519  TSH 0.506   A1C: Lab Results  Component Value Date   HGBA1C 5.8 (H) 05/13/2020     Assessment/Plan   Assessment and Plan Assessment  & Plan Right shoulder pain with limited range of motion Right shoulder pain with limited range of motion for two months. - Refer to orthopedic specialist for evaluation and imaging, including x-ray and ultrasound. - Consider physical therapy post-orthopedic evaluation.  Low back  pain/strain Acute low back strain with pain, no radiation or neurological symptoms. Previous prednisone  effective but has returned. - Order x-ray of the low back. - Recommend physical therapy - Advise heating pad use two to three times daily for 20-30 minutes. - Recommend Tylenol  1000 mg every 8 hours.  Breast cancer Breast cancer under active management with new medication regimen, no side effects reported. - Continue current treatment regimen as prescribed by oncologist. - Ensure regular follow-up with oncologist.  Chronic kidney disease stage 4 - Refer to nephrologist for further evaluation and management.  Hypertension Hypertension well-controlled with current medication regimen, - Continue current antihypertensive regimen and followed with cardiologist.  - Follow up with cardiologist next month.  Gout Gout managed with allopurinol , no recent flares. - Continue allopurinol  regimen. - Check uric acid level.  Overactive bladder Overactive bladder managed with oxybutynin , no issues reported. - Continue oxybutynin .  Insomnia Insomnia exacerbated by pain, previous zolpidem  effective without side effects. - Prescribe zolpidem  5 mg at night as needed.  Hyperlipidemia  Cholesterol not checked in over a year, routine health maintenance required. - Order cholesterol panel.   Return in about 3 months (around 12/05/2024) for routine follow up.  Michele Page [facility] [phone]

## 2024-09-04 NOTE — Patient Instructions (Addendum)
 [facility] Address: [address] [place], [place] [address] Phone: [phone]   Use heating pad to low back 2-3 times daily for 20-30 mins  Tylenol  500 mg 2 tablets every 8 hours for pain.

## 2024-09-04 NOTE — Telephone Encounter (Signed)
 Barnie with [facility] [phone] called and stated that patient is there for a Back X-Ray and there is no order for this in Epic.   Needing order placed. Patient there.  Please Advise.

## 2024-09-04 NOTE — Telephone Encounter (Signed)
 Order placed, I thought I had placed the order when I saw her.

## 2024-09-04 NOTE — Telephone Encounter (Signed)
 Barnie with [employer] [facility] and agreed.

## 2024-09-05 ENCOUNTER — Ambulatory Visit: Payer: Self-pay | Admitting: Nurse Practitioner

## 2024-09-05 ENCOUNTER — Ambulatory Visit
Admission: RE | Admit: 2024-09-05 | Discharge: 2024-09-05 | Disposition: A | Discharge status: Home / Self Care | Source: Ambulatory Visit | Attending: Nurse Practitioner | Admitting: Nurse Practitioner

## 2024-09-05 DIAGNOSIS — M545 Low back pain, unspecified: Secondary | ICD-10-CM

## 2024-09-05 DIAGNOSIS — M47816 Spondylosis without myelopathy or radiculopathy, lumbar region: Secondary | ICD-10-CM | POA: Diagnosis not present

## 2024-09-12 ENCOUNTER — Telehealth: Payer: Self-pay | Admitting: Neurology

## 2024-09-12 NOTE — Telephone Encounter (Signed)
Patient called to verify appointment 

## 2024-09-13 DIAGNOSIS — C50412 Malignant neoplasm of upper-outer quadrant of left female breast: Secondary | ICD-10-CM | POA: Diagnosis not present

## 2024-09-13 DIAGNOSIS — Z170 Estrogen receptor positive status [ER+]: Secondary | ICD-10-CM | POA: Diagnosis not present

## 2024-09-14 ENCOUNTER — Ambulatory Visit: Attending: Nurse Practitioner | Admitting: Physical Therapy

## 2024-09-14 ENCOUNTER — Encounter: Payer: Self-pay | Admitting: Physical Therapy

## 2024-09-14 ENCOUNTER — Other Ambulatory Visit: Payer: Self-pay

## 2024-09-14 DIAGNOSIS — M5459 Other low back pain: Secondary | ICD-10-CM | POA: Diagnosis not present

## 2024-09-14 DIAGNOSIS — M545 Low back pain, unspecified: Secondary | ICD-10-CM | POA: Diagnosis not present

## 2024-09-14 NOTE — Therapy (Addendum)
 OUTPATIENT PHYSICAL THERAPY THORACOLUMBAR EVALUATION   Patient Name: Michele Page MRN: [id number] DOB:11/13/1956, 68 y.o., female Today's Date: 09/14/2024  END OF SESSION:  PT End of Session - 09/14/24 1028     Visit Number 1    Number of Visits 12    Date for Recertification  11/02/24    PT Start Time 1035    PT Stop Time 1120    PT Time Calculation (min) 45 min    Activity Tolerance Patient tolerated treatment well    Behavior During Therapy [facility] for tasks assessed/performed          Past Medical History:  Diagnosis Date   Anemia    past hx many yrs ago    Arthritis    lt knee   Breast cancer ([name])    Both breasts   Breast cancer of 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) 04/02/2016   Chronic kidney disease    CKD   Colon polyps    COPD (chronic obstructive pulmonary disease) ([name])    GERD (gastroesophageal reflux disease)    years ago- not current    Hyperlipidemia    Hypertension    Multiple sclerosis    Personal history of chemotherapy    Personal history of radiation therapy    Stroke ([name])    2013, mild cognitive deficits   Vision abnormalities    Past Surgical History:  Procedure Laterality Date   BREAST BIOPSY Left 03/31/2016   BREAST BIOPSY Right 07/24/2021   BREAST LUMPECTOMY Left 04/17/2016   BREAST LUMPECTOMY Right 09/02/2021   BREAST LUMPECTOMY WITH RADIOACTIVE SEED AND SENTINEL LYMPH NODE BIOPSY Right 09/02/2021   Procedure: RIGHT BREAST LUMPECTOMY WITH RADIOACTIVE SEED AND SENTINEL LYMPH NODE BIOPSY;  Surgeon: Vanderbilt Ned, MD;  Location: [facility];  Service: General;  Laterality: Right;   COLONOSCOPY     HYSTEROSCOPY WITH D & C N/A 12/19/2015   Procedure: DILATATION AND CURETTAGE /HYSTEROSCOPY;  Surgeon: Duwaine Blumenthal, DO;  Location: [facility];  Service: Gynecology;  Laterality: N/A;   POLYPECTOMY     PORTACATH PLACEMENT Right 10/16/2021   Procedure: INSERTION PORT-A-CATH;  Surgeon: Vanderbilt Ned, MD;  Location:  [facility];  Service: General;  Laterality: Right;   RADIOACTIVE SEED GUIDED PARTIAL MASTECTOMY WITH AXILLARY SENTINEL LYMPH NODE BIOPSY Left 04/17/2016   Procedure: RADIOACTIVE SEED GUIDED PARTIAL MASTECTOMY WITH AXILLARY SENTINEL LYMPH NODE BIOPSY;  Surgeon: Ned Vanderbilt, MD;  Location: [facility];  Service: General;  Laterality: Left;  RADIOACTIVE SEED GUIDED PARTIAL MASTECTOMY WITH AXILLARY SENTINEL LYMPH NODE BIOPSY    TUBAL LIGATION     VAGINAL DELIVERY     x3   Patient Active Problem List   Diagnosis Date Noted   Positive D dimer 07/25/2024   RLQ abdominal pain 07/25/2024   Leukopenia 07/25/2024   Macrocytic anemia 07/25/2024   Elevated troponin 07/25/2024   Overactive bladder 06/05/2024   Chronic gout of left hand 06/05/2024   Morbid obesity ([name]) 06/05/2024   Port-A-Cath in place 11/06/2021   Malignant neoplasm of upper-outer quadrant of right breast in female, estrogen receptor positive ([name]) 08/04/2021   Neck pain on right side 06/01/2018   History of breast cancer 11/25/2017   Primary insomnia 11/25/2017   Breast cancer of 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) 04/02/2016   Essential hypertension 01/10/2016   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 01/10/2016   Gastroesophageal reflux disease without esophagitis 01/10/2016   Hyperlipidemia 01/10/2016  CKD (chronic kidney disease) stage 4, GFR 15-29 ml/min ([name]) 01/10/2016   Multiple sclerosis 03/20/2015   Accelerated hypertension 03/20/2015   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/2015    PCP: Caro Harlene POUR, NP  REFERRING PROVIDER: Caro Harlene POUR, NP  REFERRING DIAG: M54.50 (ICD-10-CM) - Acute right-sided low back pain without sciatica  Rationale for Evaluation and Treatment: Rehabilitation  THERAPY DIAG:  Other low back pain  ONSET DATE: 3 weeks ago  SUBJECTIVE:                                                                                                                                                                                            SUBJECTIVE STATEMENT: Pt states that 3 weeks ago, she was reaching for something and noted an onset of right sided low back pain. She reports that she is overall improving since onset. She continues to note pain with L trunk rotation. Pt denies specific activities or program. 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 Symptoms are intermittent in nature and range in intensity from 0/10- 5/10. Pt reports pain is located in the right side of her lumbar spine and pain that wraps around to the anterior trunk.  PERTINENT HISTORY:  From Referring provider visit: She describes a recent onset of low back pain, which began about two weeks ago after reaching for something and experiencing a pull. The pain is localized to the lower back and does not radiate down the leg. She rates the pain as an 8 out of 10 but continues to work despite the discomfort. She has not used heat or Tylenol  for the pain but wants pain relief. No numbness, tingling, or incontinence. Low back  pain/strain Acute low back strain with pain, no radiation or neurological symptoms. Previous prednisone  effective but has returned. - Order x-ray of the low back. - Recommend physical therapy - Advise heating pad use two to three times daily for 20-30 minutes. - Recommend Tylenol  1000 mg every 8 hours.  PAIN:  Are you having pain? No  PRECAUTIONS: None  RED FLAGS: None   WEIGHT BEARING RESTRICTIONS: No  FALLS:  Has patient fallen in last 6 months? No  LIVING ENVIRONMENT: Lives with: lives with their spouse Lives in: House/apartment Stairs: Yes: Internal: 1 flight steps; on right going up Has following equipment at home: None  OCCUPATION: [employer]  PLOF: Independent  PATIENT GOALS: Back feels better   NEXT MD VISIT: January 2026  OBJECTIVE:  Note: Objective measures were  completed at Evaluation unless otherwise noted.  DIAGNOSTIC FINDINGS:  From 09/05/24: IMPRESSION: 1. Advanced multilevel degenerative changes of  the lumbar spine, predominately involving the facets. 2. Known osseous metastatic disease better visualized on prior CT.  PATIENT SURVEYS:  Modified Oswestry:  MODIFIED OSWESTRY DISABILITY SCALE  Date: 09/14/24 Score  Pain intensity 1 = The pain is bad, but I can manage without having to take pain medication  2. Personal care (washing, dressing, etc.) 0 =  I can take care of myself normally without causing increased pain.  3. Lifting 2 = Pain prevents me from lifting heavy weights off the floor,but I can manage if the weights are conveniently positioned (e.g. on a table)  4. Walking 0 = Pain does not prevent me from walking any distance  5. Sitting 0 =  I can sit in any chair as long as I like.  6. Standing 0 =  I can stand as long as I want without increased pain.  7. Sleeping 0 = Pain does not prevent me from sleeping well.  8. Social Life 0 = My social life is normal and does not increase my pain.  9. Traveling 0 =  I can travel anywhere without increased pain.  10. Employment/ Homemaking 0 = My normal homemaking/job activities do not cause pain.  Total 3/50   Interpretation of scores: Score Category Description  0-20% Minimal Disability The patient can cope with most living activities. Usually no treatment is indicated apart from advice on lifting, sitting and exercise  21-40% Moderate Disability The patient experiences more pain and difficulty with sitting, lifting and standing. Travel and social life are more difficult and they may be disabled from work. Personal care, sexual activity and sleeping are not grossly affected, and the patient can usually be managed by conservative means  41-60% Severe Disability Pain remains the main problem in this group, but activities of daily living are affected. These patients require a detailed  investigation  61-80% Crippled Back pain impinges on all aspects of the patient's life. Positive intervention is required  81-100% Bed-bound These patients are either bed-bound or exaggerating their symptoms  Bluford FORBES Zoe DELENA Karon DELENA, et al. Surgery versus conservative management of stable thoracolumbar fracture: the PRESTO feasibility RCT. [place] ([place]): [employer]; 2021 Nov. [facility] [employer] Assessment, No. 25.62.) Appendix 3, Oswestry Disability Index category descriptors. Available from: Findjewelers.cz  Minimally Clinically Important Difference (MCID) = 12.8%  COGNITION: Overall cognitive status: Within functional limits for tasks assessed     SENSATION: WFL   POSTURE: rounded shoulders, forward head, increased lumbar lordosis, and increased thoracic kyphosis  PALPATION: TTP in the right paraspinals. Does not radiate to the hip. Covers levels T12-L3  LUMBAR ROM:   AROM eval  Flexion Nil loss  Extension 50% loss, P!  Right lateral flexion Nil loss  Left lateral flexion Nil loss, P! R lumbar  Right rotation   Left rotation    (Blank rows = not tested)    LOWER EXTREMITY MMT:  R hip flexion strength deficit residual from CVA 2013  MMT Right eval Left eval  Hip flexion 3+/5 5/5  Hip extension 5/5 5/5  Hip abduction 5/5 5/5  Hip adduction 5/5 5/5  Hip internal rotation    Hip external rotation    Knee flexion 5/5 5/5  Knee extension 5/5 5/5  Ankle dorsiflexion 5/5 5/5  Ankle plantarflexion 5/5 5/5  Ankle inversion    Ankle eversion     (Blank rows = not tested)  GAIT: Distance walked: lobby to treatment area Assistive device utilized: None Level of assistance: Complete Independence Comments: inc  time, reciprocal pattern, no major deficits noted  TREATMENT DATE: 09/14/2024  [facility] Adult PT Treatment:                                                DATE: 09/14/24 Therapeutic Exercise: Sustained thoracolumbar  extension in prone prop x3 mins with symptoms yielding a dec, not better response.  Pt completes repeated lumbar extension in standing with a prod, worse response following 8 reps Pt completes repeated lumbar flexion in sitting x10 reps with NE on symptoms Repeated flexion in lying x10 yields a dec, better response with this included in HEP Slouch overcorrect x30 in sitting to complete mid range lumbar mobility and progress ROM as tolerated -included in HEP                                                                                                                                  PATIENT EDUCATION:  Education details: Pt educated on relevant anatomy, physiology, pathology, diagnosis, prognosis, progression of care, pain and activity modification related to right sided low back pain Person educated: Patient Education method: Explanation, Demonstration, and Handouts Education comprehension: verbalized understanding and returned demonstration  HOME EXERCISE PROGRAM: Access Code: [email or web address] URL: [email or web address] Date: 09/14/2024 Prepared by: Stann Ohara  Exercises - Slouch Overcorrect on Swiss Ball  - 1 x daily - 7 x weekly - 3 sets - 10 reps - 2 hold - Supine Double Knee to Chest  - 2 x daily - 7 x weekly - 1 sets - 10 reps - 2 hold  ASSESSMENT:  CLINICAL IMPRESSION: Patient is a 68 y.o. F who was seen today for physical therapy evaluation and treatment for right sided low back pain with onset 3 weeks ago when reaching for something. Pt has been overall improving since onset and reports that she continues to note symptoms with increased activity, but is able to work through the pain. Pt symptoms are present in the right side of the lumbar spine and there is pain that wraps from posterior to anterior consistent with T11/12 dermatomal pattern. Pt demonstrates decreased tolerance to repeated end range movements in the lumbar spine, especially in a loaded state.  Provisional directional preference noted as repeated flexion in lying. Per chart review there is hx of mets to the lumbar region, without evidence of contribution to pt symptoms at this time.  Pt stands to benefit from continued skilled physical therapy to address deficit areas and restore safety with activities and participations at home and in the community.    OBJECTIVE IMPAIRMENTS: decreased activity tolerance, decreased endurance, decreased knowledge of condition, decreased ROM, and pain.   ACTIVITY LIMITATIONS: carrying, lifting, and bending  PARTICIPATION LIMITATIONS: cleaning, laundry, and occupation  PERSONAL FACTORS: 3+ comorbidities: Hx CVA, Hx Breast CA, Hx MS, Hx CKD are also  affecting patient's functional outcome.   REHAB POTENTIAL: Good  CLINICAL DECISION MAKING: Stable/uncomplicated  EVALUATION COMPLEXITY: Low   GOALS: Goals reviewed with patient? Yes  SHORT TERM GOALS: Target date: 10/12/24   Pt will report compliance with HEP to work towards ind and home management strategies Baseline: provided today Goal status: INITIAL   2.  Pt will score no greater than 0/50 on mod ODI to demonstrate improved activity tolerance Baseline: 3/50 Goal status: INITIAL   3.  Pt will improve Lumbar ROM to full and painless in order to demonstrate progress towards activity tolerance and improved function Baseline: see ROM chart  Goal status: INITIAL     LONG TERM GOALS: Target date: 11/02/24   Pt will complete 5 consecutive dead lifts with 30 pounds or greater to mimic work like activity without being limited by her symptoms  Baseline: pain with return to upright position without weight  Goal status: INITIAL   2.  Pt will report no greater than 0/10 pain over 7 consecutive days to demonstrate maintained reduction in symptoms and improved tolerance to activity Baseline: 0/10-5/10 Goal status: INITIAL   3.  Pt will be ind in the management of their symptoms at home and in the  community Baseline: progress education and interventions for home management Goal status: INITIAL    PLAN:  PT FREQUENCY: 1-2x/week  PT DURATION: other: 7 weeks  PLANNED INTERVENTIONS: 97110-Therapeutic exercises, 97530- Therapeutic activity, [id number]- Neuromuscular re-education, 97535- Self Care, [id number]- Manual therapy, and Patient/Family education.  PLAN FOR NEXT SESSION: core strength and stability, progress tolerance to end range movements of the lumbar spine, soft tissue mobilization to thoracolumbar region with emphasis to paraspinals   Stann DELENA Ohara, PT 09/14/2024, 11:59 AM     Referring diagnosis? M54.50 (ICD-10-CM) - Acute right-sided low back pain without sciatica Treatment diagnosis? (if different than referring diagnosis) Other low back pain [M54.59] What was this (referring dx) caused by? []  Surgery []  Fall []  Ongoing issue []  Arthritis [x]  Other: ____reaching for item________  Laterality: [x]  Rt []  Lt []  Both  Check all possible CPT codes:  *CHOOSE 10 OR LESS*    See Planned Interventions listed in the Plan section of the Evaluation.

## 2024-09-17 NOTE — Progress Notes (Deleted)
 [facility]  PATIENT: Michele Page DOB: 1956-09-11  REFERRING DOCTOR OR PCP:  Odella Pepper SOURCE: patient  _________________________________   HISTORICAL  CHIEF COMPLAINT:  No chief complaint on file.   HISTORY OF PRESENT ILLNESS:  Michele Page is a 68 y.o. woman with MS and h/o CVA.       Update 11/05/2022 I had the pleasure seeing your patient, [name], at [facility] for neurologic consultation regarding her history of multiple sclerosis and stroke and more recent headache/neck pain.  She is a 68 yo with multiple sclerosis that was diagnosed in 2008 and h/o CVA (left thalamic hemorrhage) in 2011..  I saw her about 4 years ago and she was stable at that time..  She continues to feel neurologically stable.. She has never been on a DMT for the MS.   She reports a severe headache for 1/2 day associated with neck pain, worse when head was tllted to the left and better when tilted to the right.   She rested and pain was better later in the day and has not returned.   She also was having dizzy spells but none recently.     Gait/strength/sensation: After the stroke right side had weakness , very mild numbness and clumsiness. All the symptoms improved though gait is mildly ataxic and she has trouble with right hand writing.  . She denies any numbness in either leg.   She is active and walks for exercise at times    She could walk > 1 hour without a rest.    She uses the bannister both up and down stairs.    Bladder/Bowel:   She denies any significant bowel or bladder issue.  She has 1-2 x nocturia at night.  Vision: She feels his vision is fine.  No difficulty with color vision.  She denies diplopia.  Fatigue/Sleep: She denies fatigue.  She usually sleeps well.  She snores but denies any excessive daytime sleepiness.  No one has reported pauses in her breathing. Mood/Cognition:   She denies anxiety or depression.      She has major cognitive  difficulty.  She has a lot of difficulty with memory and often needs hints/cues.   She feels LTM is better.   She notes some difficulty coming up with the right words.   She also feels processing speed is reduced.  She has also been treated for breast cancer with lumpectomy (03/2016) and radiation .  She is on Ibrance  and letrozole    Vascular risks:   She has HTN (worse since Ibrance  started).   She has mild hyperlipidemia.    On ASA for the CVA prophylaxis.    MS History:   About 2008, she had the onset of left sided numbness. The symptoms came on fairly quickly. They resolved over 2-3 weeks. She had an MRI at the time that showed lesions consistent with MS. She thinks she may have had a lumbar puncture as well. Her doctor at the time advised that she start medications for MS. However, all of the options were self injectable at that time and she preferred not to begin any of the therapies. She feels that she has done fairly well since that time and denies any new exacerbations. A few months ago, she moved down to [place]  from [place] .  Stroke History:  She has elevated blood pressure and had around 2011. She had the sudden onset of right arm and leg weakness. Additionally, she was having  a lot of speech difficulties initially. Over time, the weakness improved and her speech improved, though she does not feel that they got back to baseline. She notes difficulty with handwriting and other skilled tasks with the right hand, even though she feels her strength is fine.SABRA MRI was performed in the hospital at that time (she does not remember where). Her symptoms and imaging studies were felt to be more consistent with stroke than to an MS exacerbation.  .  Imaging: MRI of the brain from 02/27/2017. There is a remote hemorrhage involving the left thalamus and adjacent internal capsule little bit of the basal ganglia. Some wallerian degeneration is noted. She also has extensive T2/FLAIR hyperintense foci in  the hemispheres some radially oriented in the ventricles and some of the juxtacortical white matter. This likely represents a combination of demyelination and chronic microvascular ischemic change.  There were no acute findings.  MRI of the brain 12/14/2018 was unchanged compared to 02/27/2017.  MRI of the brain 11/24/2022 showed a remote left thalamic hemorrhage.  This was also noted on the 11/24/2018 MRI.  A small chronic microhemorrhage is noted in the right cerebellar hemisphere, also seen on the previous MRI.    Extensive T2/FLAIR hyperintense foci in the cerebral hemispheres and some foci in the pons.  These are nonspecific and could be due to severe chronic microvascular ischemic change.  Some could also be due to chronic demyelination.  Compared to the MRI from 2020, there are no new lesions.   REVIEW OF SYSTEMS: Constitutional: No fevers, chills, sweats, or change in appetite Eyes: No visual changes, double vision, eye pain Ear, nose and throat: No hearing loss, ear pain, nasal congestion, sore throat Cardiovascular: No chest pain, palpitations Respiratory:  No shortness of breath at rest or with exertion.   No wheezes GastrointestinaI: No nausea, vomiting, diarrhea, abdominal pain, fecal incontinence Genitourinary:  No dysuria, urinary retention or frequency.  She has nocturia. She has mildly elevated creatinine  Musculoskeletal:  No neck pain, back pain Integumentary: No rash, pruritus, skin lesions Neurological: as above Psychiatric: No depression at this time.  No anxiety.  She notes some memory loss. Endocrine: No palpitations, diaphoresis, change in appetite, change in weigh or increased thirst Hematologic/Lymphatic:  No anemia, purpura, petechiae. Allergic/Immunologic: No itchy/runny eyes, nasal congestion, recent allergic reactions, rashes  ALLERGIES: No Known Allergies  HOME MEDICATIONS:  Current Outpatient Medications:    allopurinol  (ZYLOPRIM ) 100 MG tablet, Take 0.5 tablets  (50 mg total) by mouth every other day., Disp: 45 tablet, Rfl: 0   aspirin  EC 81 MG tablet, Take 81 mg by mouth daily. Swallow whole., Disp: , Rfl:    atorvastatin  (LIPITOR) 20 MG tablet, TAKE 1 TABLET EVERY DAY (Patient not taking: Reported on 09/14/2024), Disp: 90 tablet, Rfl: 3   carvedilol (COREG) 25 MG tablet, Take 1 tablet (25 mg total) by mouth 2 (two) times daily., Disp: 180 tablet, Rfl: 3   letrozole  (FEMARA ) 2.5 MG tablet, TAKE 1 TABLET EVERY DAY, Disp: 90 tablet, Rfl: 3   NIFEdipine  (PROCARDIA  XL/NIFEDICAL-XL) 90 MG 24 hr tablet, Take 1 tablet (90 mg total) by mouth daily., Disp: 90 tablet, Rfl: 1   oxybutynin  (DITROPAN ) 5 MG tablet, Take 10 mg by mouth daily., Disp: , Rfl:    palbociclib  (IBRANCE ) 75 MG tablet, Take 1 tablet (75 mg total) by mouth daily. Take for 21 days on, 7 days off, repeat every 28 days., Disp: 21 tablet, Rfl: 3   zolpidem  (AMBIEN ) 5 MG tablet,  Take 1 tablet (5 mg total) by mouth at bedtime as needed for sleep., Disp: 15 tablet, Rfl: 1  PAST MEDICAL HISTORY: Past Medical History:  Diagnosis Date   Anemia    past hx many yrs ago    Arthritis    lt knee   Breast cancer ([name])    Both breasts   Breast cancer of 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) 04/02/2016   Chronic kidney disease    CKD   Colon polyps    COPD (chronic obstructive pulmonary disease) ([name])    GERD (gastroesophageal reflux disease)    years ago- not current    Hyperlipidemia    Hypertension    Multiple sclerosis    Personal history of chemotherapy    Personal history of radiation therapy    Stroke ([name])    2013, mild cognitive deficits   Vision abnormalities     PAST SURGICAL HISTORY: Past Surgical History:  Procedure Laterality Date   BREAST BIOPSY Left 03/31/2016   BREAST BIOPSY Right 07/24/2021   BREAST LUMPECTOMY Left 04/17/2016   BREAST LUMPECTOMY Right 09/02/2021   BREAST LUMPECTOMY WITH RADIOACTIVE SEED AND SENTINEL LYMPH NODE BIOPSY Right 09/02/2021    Procedure: RIGHT BREAST LUMPECTOMY WITH RADIOACTIVE SEED AND SENTINEL LYMPH NODE BIOPSY;  Surgeon: Vanderbilt Ned, MD;  Location: [facility];  Service: General;  Laterality: Right;   COLONOSCOPY     HYSTEROSCOPY WITH D & C N/A 12/19/2015   Procedure: DILATATION AND CURETTAGE /HYSTEROSCOPY;  Surgeon: Duwaine Blumenthal, DO;  Location: [facility];  Service: Gynecology;  Laterality: N/A;   POLYPECTOMY     PORTACATH PLACEMENT Right 10/16/2021   Procedure: INSERTION PORT-A-CATH;  Surgeon: Vanderbilt Ned, MD;  Location: [facility];  Service: General;  Laterality: Right;   RADIOACTIVE SEED GUIDED PARTIAL MASTECTOMY WITH AXILLARY SENTINEL LYMPH NODE BIOPSY Left 04/17/2016   Procedure: RADIOACTIVE SEED GUIDED PARTIAL MASTECTOMY WITH AXILLARY SENTINEL LYMPH NODE BIOPSY;  Surgeon: Ned Vanderbilt, MD;  Location: [facility];  Service: General;  Laterality: Left;  RADIOACTIVE SEED GUIDED PARTIAL MASTECTOMY WITH AXILLARY SENTINEL LYMPH NODE BIOPSY    TUBAL LIGATION     VAGINAL DELIVERY     x3    FAMILY HISTORY: Family History  Problem Relation Age of Onset   Hypertension Mother    Stroke Mother    Stroke Sister    Alcohol abuse Brother    HIV/AIDS Brother    Breast cancer Cousin        dx 56s   Colon cancer Neg Hx    Colon polyps Neg Hx    Esophageal cancer Neg Hx    Rectal cancer Neg Hx    Stomach cancer Neg Hx     SOCIAL HISTORY:  Social History   Socioeconomic History   Marital status: Married    Spouse name: Not on file   Number of children: 3   Years of education: Not on file   Highest education level: Some college, no degree  Occupational History   Occupation: retired  Tobacco Use   Smoking status: Former    Current packs/day: 0.00    Average packs/day: 0.5 packs/day for 25.0 years (12.5 ttl pk-yrs)    Types: Cigarettes    Start date: 11/17/1979    Quit date: 11/16/2004    Years since quitting: 19.8   Smokeless tobacco: Never  Vaping Use    Vaping status: Never Used  Substance and Sexual Activity   Alcohol use: Yes  Comment: Occassionally.   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   Exercise- Yes   Living will-No   DNR-N/A   POA/HPOA-No      Works part-time at [employer]   Social Drivers of [employer]: Low Risk  (08/31/2024)   Overall Financial Resource Strain (CARDIA)    Difficulty of Paying Living Expenses: Not very hard  Food Insecurity: No Food Insecurity (08/31/2024)   Hunger Vital Sign    Worried About Running Out of Food in the Last Year: Never true    Ran Out of Food in the Last Year: Never true  Transportation Needs: Unknown (08/31/2024)   PRAPARE - Administrator, Civil Service (Medical): No    Lack of Transportation (Non-Medical): Not on file  Physical Activity: Insufficiently Active (08/31/2024)   Exercise Vital Sign    Days of Exercise per Week: 2 days    Minutes of Exercise per Session: 20 min  Stress: No Stress Concern Present (08/31/2024)   [employer] of Occupational Health - Occupational Stress Questionnaire    Feeling of Stress: Not at all  Social Connections: Moderately Integrated (08/31/2024)   Social Connection and Isolation Panel    Frequency of Communication with Friends and Family: Once a week    Frequency of Social Gatherings with Friends and Family: Once a week    Attends Religious Services: More than 4 times per year    Active Member of [employer] or Organizations: Yes    Attends Banker Meetings: 1 to 4 times per year    Marital Status: Married  Catering Manager Violence: Not At Risk (07/25/2024)   Humiliation, Afraid, Rape, and Kick questionnaire    Fear of Current or Ex-Partner: No     Emotionally Abused: No    Physically Abused: No    Sexually Abused: No     PHYSICAL EXAM  There were no vitals filed for this visit.    There is no height or weight on file to calculate BMI.   General: The patient is well-developed and well-nourished and in no acute distress   Neck: The neck is supple, no carotid bruits are noted.  The neck is nontender.   Cardiovascular: The heart has a regular rate and rhythm with a normal S1 and S2. There were no murmurs, gallops or rubs. Lungs are clear to auscultation.   Skin: Extremities are without significant edema.   Musculoskeletal:  Back is nontender   Neurologic Exam   Mental status: The patient is alert and oriented x 3 at the time of the examination. The patient has reduced recent and remote memory, and a reduced  apparently attention span and concentration ability.   Speech has a few errors.  She understands well.  She has a right hand apraxia.   Cranial nerves: Extraocular movements are full. Pupils are equal, round, and reactive to light and accomodation.  Visual fields are full.  Facial symmetry is present. There is good facial sensation to soft touch bilaterally.Facial strength is normal.  Trapezius and sternocleidomastoid strength is normal. No dysarthria is noted.  The tongue is midline, and the patient has symmetric elevation of the  soft palate. No obvious hearing deficits are noted.   Motor:  Muscle bulk is normal.   Tone is normal. Strength is  5 / 5 in  both arms but reduced right RAM.    Strength is 4+ over 5 in the right leg. 5/5 in left leg   Sensory: Sensory testing is intact to pinprick, soft touch and vibration sensation in all 4 extremities.   Coordination: Cerebellar testing reveals good  left and mildly reduced right finger-nose-finger and  poor right heel-to-shin . She has difficulty writing   Gait and station: Station is normal.  The gait has a normal stride.  Her tandem gait is wide..   Romberg is negative.     Reflexes: Deep tendon reflexes are symmetric and normal bilaterally in arms and slightly elevated in the right leg compared to the left.   Plantar responses are flexor.    DIAGNOSTIC DATA (LABS, IMAGING, TESTING) - I reviewed patient records, labs, notes, testing and imaging myself where available.  Lab Results  Component Value Date   WBC 2.5 (L) 07/26/2024   HGB 9.2 (L) 07/26/2024   HCT 27.9 (L) 07/26/2024   MCV 104.9 (H) 07/26/2024   PLT 358 07/26/2024      Component Value Date/Time   NA 142 07/26/2024 0600   NA 146 (H) 07/29/2020 1655   NA 141 04/08/2016 0904   K 3.6 07/26/2024 0600   K 4.7 04/08/2016 0904   CL 107 07/26/2024 0600   CO2 19 (L) 07/26/2024 0600   CO2 26 04/08/2016 0904   GLUCOSE 91 07/26/2024 0600   GLUCOSE 124 04/08/2016 0904   BUN 26 (H) 07/26/2024 0600   BUN 23 07/29/2020 1655   BUN 16.3 04/08/2016 0904   CREATININE 2.03 (H) 07/26/2024 0600   CREATININE 2.10 (H) 07/10/2024 0955   CREATININE 1.60 (H) 06/14/2024 1345   CREATININE 1.5 (H) 04/08/2016 0904   CALCIUM  9.5 07/26/2024 0600   CALCIUM  10.0 04/08/2016 0904   PROT 7.1 07/25/2024 1509   PROT 7.6 04/08/2016 0904   ALBUMIN  4.3 07/25/2024 1509   ALBUMIN  4.3 04/08/2016 0904   AST 20 07/25/2024 1509   AST 18 07/10/2024 0955   AST 25 04/08/2016 0904   ALT 12 07/25/2024 1509   ALT 12 07/10/2024 0955   ALT 23 04/08/2016 0904   ALKPHOS 120 07/25/2024 1509   ALKPHOS 90 04/08/2016 0904   BILITOT 0.5 07/25/2024 1509   BILITOT 0.3 07/10/2024 0955   BILITOT 0.58 04/08/2016 0904   GFRNONAA 26 (L) 07/26/2024 0600   GFRNONAA 25 (L) 07/10/2024 0955   GFRNONAA 33 (L) 04/18/2021 1348   GFRAA 38 (L) 04/18/2021 1348    Lab Results  Component Value Date   TSH 0.506 08/24/2024       ASSESSMENT AND PLAN  No diagnosis found.   1.  She has probable MS superimposed on a left thalamic hemorrhage and small vessel ischemic change.   We will check an MRI of the brain to determine if there has been  significant progression.   She has not currently on a disease modifying therapy but we would consider if there has been significant progression consistent with MS..  If any subclinical progression would recommend a DMT, otherwise she will remain off.  2.   We discussed the importance of staying on her hypertensive medication.  That was her major risk factor for the thalamic hemorrhage and is her major risk factor going forward for additional strokes. 3.    She will  return to see me as needed if there are new or worsening neurologic symptoms.  Giankarlo Leamer A. Vear, MD, PhD 09/17/2024, 6:57 PM Certified in Neurology, Clinical Neurophysiology, Sleep Medicine, Pain Medicine and Neuroimaging  [facility] [address] [place], [place] [address] [phone]

## 2024-09-18 ENCOUNTER — Encounter: Payer: Self-pay | Admitting: Radiology

## 2024-09-19 ENCOUNTER — Encounter (HOSPITAL_BASED_OUTPATIENT_CLINIC_OR_DEPARTMENT_OTHER)

## 2024-09-19 ENCOUNTER — Ambulatory Visit: Admitting: Physician Assistant

## 2024-09-19 ENCOUNTER — Encounter: Payer: Self-pay | Admitting: Physician Assistant

## 2024-09-19 ENCOUNTER — Other Ambulatory Visit: Payer: Self-pay | Admitting: Hematology and Oncology

## 2024-09-19 ENCOUNTER — Other Ambulatory Visit: Payer: Self-pay

## 2024-09-19 DIAGNOSIS — M25511 Pain in right shoulder: Secondary | ICD-10-CM | POA: Insufficient documentation

## 2024-09-19 MED ORDER — TRAMADOL HCL 50 MG PO TABS: 50.0000 mg | ORAL_TABLET | Freq: Two times a day (BID) | ORAL | 0 refills | Status: DC | PRN

## 2024-09-19 NOTE — Progress Notes (Signed)
 Office Visit Note   Patient: Michele Page           Date of Birth: May 13, 1956           MRN: [id number] Visit Date: 09/19/2024              Requested by: Caro Harlene POUR, NP [address] [place] [place],  [place] [address] PCP: Caro Harlene POUR, NP   Assessment & Plan: Visit Diagnoses:  1. Right shoulder pain, unspecified chronicity     Plan: Patient is seen today for referral for right shoulder pain has been going on for 2 months.  She denies any injuries.  She has been seeing her primary care provider.  She is currently in PT for her back.  She does have a history of many years ago for right sided CVA.  She denies any weakness just has pain in the shoulder that radiates down into her biceps.  She thinks this full began when she was reaching for something.  She does have a little bit of swelling in her hand here she has negative speeds sign negative empty can test she does have some stiffness with motion overhead slight reproduction of pain with external rotation she does have some degenerative changes.  Since this has been going on for 2 months I recommend evaluation with physical therapy we referred her today.  She can follow-up with me in 3 to 4 weeks.  She has tried using Tylenol  with no relief and cannot take anti-inflammatories I did give her a few tramadol  she understands that she cannot take these during the day but can take them when she comes home from work  Follow-Up Instructions: Return in about 4 weeks (around 10/17/2024).   Orders:  Orders Placed This Encounter  Procedures   Ambulatory referral to Physical Therapy   Meds ordered this encounter  Medications   traMADol  (ULTRAM ) 50 MG tablet    Sig: Take 1 tablet (50 mg total) by mouth every 12 (twelve) hours as needed.    Dispense:  30 tablet    Refill:  0      Procedures: No procedures performed   Clinical Data: No additional findings.   Subjective: Chief Complaint  Patient presents with   Left Shoulder -  Pain    HPI pleasant 68 year old woman comes in with a 28-month history of right shoulder pain may have started when she reached to give somebody something.  She has been seeing her primary care provider.  Unfortunately she cannot take any anti-inflammatories  Review of Systems  All other systems reviewed and are negative.    Objective: Vital Signs: There were no vitals taken for this visit.  Physical Exam Constitutional:      Appearance: Normal appearance.  Pulmonary:     Effort: Pulmonary effort is normal.  Skin:    General: Skin is warm and dry.  Neurological:     General: No focal deficit present.     Mental Status: She is alert and oriented to person, place, and time.  Psychiatric:        Mood and Affect: Mood normal.        Behavior: Behavior normal.     Ortho Exam Examination right shoulder she does not really have any swelling in her arm a little swelling in her hand but no redness no erythema.  She goes slowly overhead and has difficulty going behind her back.  Pain is more in her biceps though she has good biceps  and tricep strength not tender over the distal proximal biceps tendon.  Compartments are all soft she is neurovascularly intact no pain in her neck.  Negative empty can negative speeds test Specialty Comments:  No specialty comments available.  Imaging: No results found.   PMFS History: Patient Active Problem List   Diagnosis Date Noted   Pain in right shoulder 09/19/2024   Positive D dimer 07/25/2024   RLQ abdominal pain 07/25/2024   Leukopenia 07/25/2024   Macrocytic anemia 07/25/2024   Elevated troponin 07/25/2024   Overactive bladder 06/05/2024   Chronic gout of left hand 06/05/2024   Morbid obesity ([name]) 06/05/2024   Port-A-Cath in place 11/06/2021   Malignant neoplasm of upper-outer quadrant of right breast in female, estrogen receptor positive ([name]) 08/04/2021   Neck pain on right side 06/01/2018   History of breast cancer 11/25/2017    Primary insomnia 11/25/2017   Breast cancer of 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) 04/02/2016   Essential hypertension 01/10/2016   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 01/10/2016   Gastroesophageal reflux disease without esophagitis 01/10/2016   Hyperlipidemia 01/10/2016   CKD (chronic kidney disease) stage 4, GFR 15-29 ml/min ([name]) 01/10/2016   Multiple sclerosis 03/20/2015   Accelerated hypertension 03/20/2015   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/2015   Past Medical History:  Diagnosis Date   Anemia    past hx many yrs ago    Arthritis    lt knee   Breast cancer ([name])    Both breasts   Breast cancer of 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) 04/02/2016   Chronic kidney disease    CKD   Colon polyps    COPD (chronic obstructive pulmonary disease) ([name])    GERD (gastroesophageal reflux disease)    years ago- not current    Hyperlipidemia    Hypertension    Multiple sclerosis    Personal history of chemotherapy    Personal history of radiation therapy    Stroke ([name])    2013, mild cognitive deficits   Vision abnormalities     Family History  Problem Relation Age of Onset   Hypertension Mother    Stroke Mother    Stroke Sister    Alcohol abuse Brother    HIV/AIDS Brother    Breast cancer Cousin        dx 87s   Colon cancer Neg Hx    Colon polyps Neg Hx    Esophageal cancer Neg Hx    Rectal cancer Neg Hx    Stomach cancer Neg Hx     Past Surgical History:  Procedure Laterality Date   BREAST BIOPSY Left 03/31/2016   BREAST BIOPSY Right 07/24/2021   BREAST LUMPECTOMY Left 04/17/2016   BREAST LUMPECTOMY Right 09/02/2021   BREAST LUMPECTOMY WITH RADIOACTIVE SEED AND SENTINEL LYMPH NODE BIOPSY Right 09/02/2021   Procedure: RIGHT BREAST LUMPECTOMY WITH RADIOACTIVE SEED AND SENTINEL LYMPH NODE BIOPSY;  Surgeon: Vanderbilt Ned, MD;  Location: [facility];  Service: General;   Laterality: Right;   COLONOSCOPY     HYSTEROSCOPY WITH D & C N/A 12/19/2015   Procedure: DILATATION AND CURETTAGE /HYSTEROSCOPY;  Surgeon: Duwaine Blumenthal, DO;  Location: [facility];  Service: Gynecology;  Laterality: N/A;   POLYPECTOMY     PORTACATH PLACEMENT Right 10/16/2021   Procedure: INSERTION PORT-A-CATH;  Surgeon: Vanderbilt Ned, MD;  Location: [facility];  Service: General;  Laterality: Right;   RADIOACTIVE SEED GUIDED PARTIAL  MASTECTOMY WITH AXILLARY SENTINEL LYMPH NODE BIOPSY Left 04/17/2016   Procedure: RADIOACTIVE SEED GUIDED PARTIAL MASTECTOMY WITH AXILLARY SENTINEL LYMPH NODE BIOPSY;  Surgeon: Debby Shipper, MD;  Location: [facility];  Service: General;  Laterality: Left;  RADIOACTIVE SEED GUIDED PARTIAL MASTECTOMY WITH AXILLARY SENTINEL LYMPH NODE BIOPSY    TUBAL LIGATION     VAGINAL DELIVERY     x3   Social History   Occupational History   Occupation: retired  Tobacco Use   Smoking status: Former    Current packs/day: 0.00    Average packs/day: 0.5 packs/day for 25.0 years (12.5 ttl pk-yrs)    Types: Cigarettes    Start date: 11/17/1979    Quit date: 11/16/2004    Years since quitting: 19.8   Smokeless tobacco: Never  Vaping Use   Vaping status: Never Used  Substance and Sexual Activity   Alcohol use: Yes    Comment: Occassionally.   Drug use: No   Sexual activity: Yes    Comment: 1st intercourse 18 yo-5 partners

## 2024-09-20 ENCOUNTER — Institutional Professional Consult (permissible substitution): Admitting: Neurology

## 2024-09-20 ENCOUNTER — Other Ambulatory Visit: Payer: Self-pay

## 2024-09-20 ENCOUNTER — Other Ambulatory Visit: Payer: Self-pay | Admitting: Pharmacy Technician

## 2024-09-20 ENCOUNTER — Encounter (INDEPENDENT_AMBULATORY_CARE_PROVIDER_SITE_OTHER): Payer: Self-pay

## 2024-09-20 MED ORDER — PALBOCICLIB 75 MG PO TABS
75.0000 mg | ORAL_TABLET | Freq: Every day | ORAL | 3 refills | Status: AC
  Filled 2024-09-20: qty 21, 28d supply, fill #0
  Filled 2024-10-16: qty 21, 28d supply, fill #1
  Filled 2024-11-08: qty 21, 28d supply, fill #2
  Filled 2024-12-07 – 2024-12-15 (×3): qty 21, 28d supply, fill #3

## 2024-09-20 NOTE — Progress Notes (Signed)
 Specialty Pharmacy Refill Coordination Note  Michele Page is a 68 y.o. female contacted today regarding refills of specialty medication(s)  Palbociclib  (IBRANCE )     Patient requested (Patient-Rptd) Delivery   Delivery date: 09/26/2024 Verified address: (Patient-Rptd) [address] ,[name] [address]   Medication will be filled on: 09/25/2024

## 2024-09-24 LAB — SIGNATERA
SIGNATERA MTM READOUT: 29.94 MTM/ml — AB
SIGNATERA TEST RESULT: POSITIVE — AB

## 2024-09-25 ENCOUNTER — Other Ambulatory Visit: Payer: Self-pay

## 2024-09-26 ENCOUNTER — Ambulatory Visit: Attending: Nurse Practitioner | Admitting: Physical Therapy

## 2024-09-26 ENCOUNTER — Encounter: Payer: Self-pay | Admitting: Physical Therapy

## 2024-09-26 DIAGNOSIS — M25511 Pain in right shoulder: Secondary | ICD-10-CM | POA: Insufficient documentation

## 2024-09-26 DIAGNOSIS — M5459 Other low back pain: Secondary | ICD-10-CM | POA: Insufficient documentation

## 2024-09-26 NOTE — Therapy (Signed)
 OUTPATIENT PHYSICAL THERAPY THORACOLUMBAR TREATMENT   Patient Name: Michele Page MRN: [id number] DOB:1956-03-05, 68 y.o., female Today's Date: 09/26/2024  END OF SESSION:  PT End of Session - 09/26/24 1020     Visit Number 2    Number of Visits 12    Date for Recertification  11/02/24    Authorization Type [employer]    Authorization - Visit Number 2    Authorization - Number of Visits 10    Progress Note Due on Visit 10    PT Start Time 1025    PT Stop Time 1110    PT Time Calculation (min) 45 min    Activity Tolerance Patient tolerated treatment well    Behavior During Therapy WFL for tasks assessed/performed           Past Medical History:  Diagnosis Date   Anemia    past hx many yrs ago    Arthritis    lt knee   Breast cancer ([name])    Both breasts   Breast cancer of 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) 04/02/2016   Chronic kidney disease    CKD   Colon polyps    COPD (chronic obstructive pulmonary disease) ([name])    GERD (gastroesophageal reflux disease)    years ago- not current    Hyperlipidemia    Hypertension    Multiple sclerosis    Personal history of chemotherapy    Personal history of radiation therapy    Stroke ([name])    2013, mild cognitive deficits   Vision abnormalities    Past Surgical History:  Procedure Laterality Date   BREAST BIOPSY Left 03/31/2016   BREAST BIOPSY Right 07/24/2021   BREAST LUMPECTOMY Left 04/17/2016   BREAST LUMPECTOMY Right 09/02/2021   BREAST LUMPECTOMY WITH RADIOACTIVE SEED AND SENTINEL LYMPH NODE BIOPSY Right 09/02/2021   Procedure: RIGHT BREAST LUMPECTOMY WITH RADIOACTIVE SEED AND SENTINEL LYMPH NODE BIOPSY;  Surgeon: Vanderbilt Ned, MD;  Location: [facility];  Service: General;  Laterality: Right;   COLONOSCOPY     HYSTEROSCOPY WITH D & C N/A 12/19/2015   Procedure: DILATATION AND CURETTAGE /HYSTEROSCOPY;  Surgeon: Duwaine Blumenthal, DO;  Location: [facility];  Service: Gynecology;   Laterality: N/A;   POLYPECTOMY     PORTACATH PLACEMENT Right 10/16/2021   Procedure: INSERTION PORT-A-CATH;  Surgeon: Vanderbilt Ned, MD;  Location: [facility];  Service: General;  Laterality: Right;   RADIOACTIVE SEED GUIDED PARTIAL MASTECTOMY WITH AXILLARY SENTINEL LYMPH NODE BIOPSY Left 04/17/2016   Procedure: RADIOACTIVE SEED GUIDED PARTIAL MASTECTOMY WITH AXILLARY SENTINEL LYMPH NODE BIOPSY;  Surgeon: Ned Vanderbilt, MD;  Location: [facility];  Service: General;  Laterality: Left;  RADIOACTIVE SEED GUIDED PARTIAL MASTECTOMY WITH AXILLARY SENTINEL LYMPH NODE BIOPSY    TUBAL LIGATION     VAGINAL DELIVERY     x3   Patient Active Problem List   Diagnosis Date Noted   Pain in right shoulder 09/19/2024   Positive D dimer 07/25/2024   RLQ abdominal pain 07/25/2024   Leukopenia 07/25/2024   Macrocytic anemia 07/25/2024   Elevated troponin 07/25/2024   Overactive bladder 06/05/2024   Chronic gout of left hand 06/05/2024   Morbid obesity ([name]) 06/05/2024   Port-A-Cath in place 11/06/2021   Malignant neoplasm of upper-outer quadrant of right breast in female, estrogen receptor positive ([name]) 08/04/2021   Neck pain on right side 06/01/2018   History of breast cancer 11/25/2017   Primary insomnia 11/25/2017  Breast cancer of 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) 04/02/2016   Essential hypertension 01/10/2016   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 01/10/2016   Gastroesophageal reflux disease without esophagitis 01/10/2016   Hyperlipidemia 01/10/2016   CKD (chronic kidney disease) stage 4, GFR 15-29 ml/min ([name]) 01/10/2016   Multiple sclerosis 03/20/2015   Accelerated hypertension 03/20/2015   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/2015    PCP: Caro Harlene POUR, NP  REFERRING PROVIDER: Caro Harlene POUR, NP  REFERRING DIAG: M54.50 (ICD-10-CM) - Acute right-sided low back pain without  sciatica  Rationale for Evaluation and Treatment: Rehabilitation  THERAPY DIAG:  Other low back pain  ONSET DATE: 3 weeks ago  SUBJECTIVE:                                                                                                                                                                                           SUBJECTIVE STATEMENT: Pt states that her right sided back pain has improved, symptoms described are consistent with centralization, symptoms noted inc with bending.   PERTINENT HISTORY:  From Referring provider visit: She describes a recent onset of low back pain, which began about two weeks ago after reaching for something and experiencing a pull. The pain is localized to the lower back and does not radiate down the leg. She rates the pain as an 8 out of 10 but continues to work despite the discomfort. She has not used heat or Tylenol  for the pain but wants pain relief. No numbness, tingling, or incontinence. Low back  pain/strain Acute low back strain with pain, no radiation or neurological symptoms. Previous prednisone  effective but has returned. - Order x-ray of the low back. - Recommend physical therapy - Advise heating pad use two to three times daily for 20-30 minutes. - Recommend Tylenol  1000 mg every 8 hours.  PAIN:  Are you having pain? Yes: NPRS scale: 8 Pain location: central low back pain Pain description: sharp Aggravating factors: bending Relieving factors: rest, static positioning   PRECAUTIONS: None  RED FLAGS: None   WEIGHT BEARING RESTRICTIONS: No  FALLS:  Has patient fallen in last 6 months? No  LIVING ENVIRONMENT: Lives with: lives with their spouse Lives in: House/apartment Stairs: Yes: Internal: 1 flight steps; on right going up Has following equipment at home: None  OCCUPATION: [employer]  PLOF: Independent  PATIENT GOALS: Back feels better   NEXT MD VISIT: January 2026  OBJECTIVE:  Note: Objective measures were  completed at Evaluation unless otherwise noted.  DIAGNOSTIC FINDINGS:  From 09/05/24: IMPRESSION: 1. Advanced multilevel degenerative changes of the lumbar spine,  predominately involving the facets. 2. Known osseous metastatic disease better visualized on prior CT.  PATIENT SURVEYS:  Modified Oswestry:  MODIFIED OSWESTRY DISABILITY SCALE  Date: 09/14/24 Score  Pain intensity 1 = The pain is bad, but I can manage without having to take pain medication  2. Personal care (washing, dressing, etc.) 0 =  I can take care of myself normally without causing increased pain.  3. Lifting 2 = Pain prevents me from lifting heavy weights off the floor,but I can manage if the weights are conveniently positioned (e.g. on a table)  4. Walking 0 = Pain does not prevent me from walking any distance  5. Sitting 0 =  I can sit in any chair as long as I like.  6. Standing 0 =  I can stand as long as I want without increased pain.  7. Sleeping 0 = Pain does not prevent me from sleeping well.  8. Social Life 0 = My social life is normal and does not increase my pain.  9. Traveling 0 =  I can travel anywhere without increased pain.  10. Employment/ Homemaking 0 = My normal homemaking/job activities do not cause pain.  Total 3/50   Interpretation of scores: Score Category Description  0-20% Minimal Disability The patient can cope with most living activities. Usually no treatment is indicated apart from advice on lifting, sitting and exercise  21-40% Moderate Disability The patient experiences more pain and difficulty with sitting, lifting and standing. Travel and social life are more difficult and they may be disabled from work. Personal care, sexual activity and sleeping are not grossly affected, and the patient can usually be managed by conservative means  41-60% Severe Disability Pain remains the main problem in this group, but activities of daily living are affected. These patients require a detailed  investigation  61-80% Crippled Back pain impinges on all aspects of the patient's life. Positive intervention is required  81-100% Bed-bound These patients are either bed-bound or exaggerating their symptoms  Bluford FORBES Zoe DELENA Karon DELENA, et al. Surgery versus conservative management of stable thoracolumbar fracture: the PRESTO feasibility RCT. [place] ([place]): [employer]; 2021 Nov. [facility] Technology Assessment, No. 25.62.) Appendix 3, Oswestry Disability Index category descriptors. Available from: [email or web address].cz  Minimally Clinically Important Difference (MCID) = 12.8%  COGNITION: Overall cognitive status: Within functional limits for tasks assessed     SENSATION: WFL   POSTURE: rounded shoulders, forward head, increased lumbar lordosis, and increased thoracic kyphosis  PALPATION: TTP in the right paraspinals. Does not radiate to the hip. Covers levels T12-L3  LUMBAR ROM:   AROM eval 09/26/24  Flexion Nil loss Nil loss, pain on return to standing  Extension 50% loss, P! Nil loss  Right lateral flexion Nil loss Nil loss  Left lateral flexion Nil loss, P! R lumbar Nil loss, P! R lumbar  Right rotation    Left rotation     (Blank rows = not tested)    LOWER EXTREMITY MMT:  R hip flexion strength deficit residual from CVA 2013  MMT Right eval Left eval  Hip flexion 3+/5 5/5  Hip extension 5/5 5/5  Hip abduction 5/5 5/5  Hip adduction 5/5 5/5  Hip internal rotation    Hip external rotation    Knee flexion 5/5 5/5  Knee extension 5/5 5/5  Ankle dorsiflexion 5/5 5/5  Ankle plantarflexion 5/5 5/5  Ankle inversion    Ankle eversion     (Blank rows = not tested)  GAIT: Distance  walked: lobby to treatment area Assistive device utilized: None Level of assistance: Complete Independence Comments: inc time, reciprocal pattern, no major deficits noted  TREATMENT DATE: 09/26/2024  [facility] Adult PT Treatment:                                                 DATE: 09/26/24 Therapeutic Exercise: Repeated lumbar flexion in supine x10: prod, W with baseline of L side glide in standing, of note, pt has been completing active hip flexion which potentially provides lumbar extension moment in 90/90 position Slouch overcorrect x20 in sitting: NE on symptoms Repeated lumbar flexion in supine x10 with pt overpressure: dec, better  Seated W stretch with physioball x12 cycles  Supine hook lying alternating knee fall out x30 BLE alternating between reps.  Overhead lat stretch attempted with no noted elongation sensation-dc  Seated physioball press x30, 3 sec hold  HEP updated and provided    [facility] Adult PT Treatment:                                                DATE: 09/14/24 Therapeutic Exercise: Sustained thoracolumbar extension in prone prop x3 mins with symptoms yielding a dec, not better response.  Pt completes repeated lumbar extension in standing with a prod, worse response following 8 reps Pt completes repeated lumbar flexion in sitting x10 reps with NE on symptoms Repeated flexion in lying x10 yields a dec, better response with this included in HEP Slouch overcorrect x30 in sitting to complete mid range lumbar mobility and progress ROM as tolerated -included in HEP                                                                                                                                  PATIENT EDUCATION:  Education details: Pt educated on relevant anatomy, physiology, pathology, diagnosis, prognosis, progression of care, pain and activity modification related to right sided low back pain Person educated: Patient Education method: Explanation, Demonstration, and Handouts Education comprehension: verbalized understanding and returned demonstration  HOME EXERCISE PROGRAM: Access Code: [email or web address] URL: [email or web address] Date: 09/26/2024 Prepared by: Stann Ohara  Exercises - Supine Double Knee to  Chest  - 5 x daily - 7 x weekly - 1 sets - 10 reps - 2 hold - Bent Knee Fallouts with Alternating Legs  - 1 x daily - 7 x weekly - 1 sets - 30 reps - 2 hold - Seated Abdominal Press into [employer]  - 1 x daily - 7 x weekly - 1 sets - 30 reps - 3 hold  ASSESSMENT:  CLINICAL IMPRESSION: Pt has been responding well to her current regimen. Symptoms noted are  consistent with centralization of c/c. She demonstrates HEP, completes active BLE hip flexion which makes symptoms worse with baseline testing. Repeats with UE assist to mitigate compensation from core weakness. Pt reports symptoms improve and are better following this modification. HEP progressed to include core strengthening and soft tissue elongation.   Eval: Patient is a 68 y.o. F who was seen today for physical therapy evaluation and treatment for right sided low back pain with onset 3 weeks ago when reaching for something. Pt has been overall improving since onset and reports that she continues to note symptoms with increased activity, but is able to work through the pain. Pt symptoms are present in the right side of the lumbar spine and there is pain that wraps from posterior to anterior consistent with T11/12 dermatomal pattern. Pt demonstrates decreased tolerance to repeated end range movements in the lumbar spine, especially in a loaded state. Provisional directional preference noted as repeated flexion in lying. Per chart review there is hx of mets to the lumbar region, without evidence of contribution to pt symptoms at this time.  Pt stands to benefit from continued skilled physical therapy to address deficit areas and restore safety with activities and participations at home and in the community.    OBJECTIVE IMPAIRMENTS: decreased activity tolerance, decreased endurance, decreased knowledge of condition, decreased ROM, and pain.   ACTIVITY LIMITATIONS: carrying, lifting, and bending  PARTICIPATION LIMITATIONS: cleaning, laundry, and  occupation  PERSONAL FACTORS: 3+ comorbidities: Hx CVA, Hx Breast CA, Hx MS, Hx CKD are also affecting patient's functional outcome.   REHAB POTENTIAL: Good  CLINICAL DECISION MAKING: Stable/uncomplicated  EVALUATION COMPLEXITY: Low   GOALS: Goals reviewed with patient? Yes  SHORT TERM GOALS: Target date: 10/12/24   Pt will report compliance with HEP to work towards ind and home management strategies Baseline: provided today Goal status: INITIAL   2.  Pt will score no greater than 0/50 on mod ODI to demonstrate improved activity tolerance Baseline: 3/50 Goal status: INITIAL   3.  Pt will improve Lumbar ROM to full and painless in order to demonstrate progress towards activity tolerance and improved function Baseline: see ROM chart  Goal status: INITIAL     LONG TERM GOALS: Target date: 11/02/24   Pt will complete 5 consecutive dead lifts with 30 pounds or greater to mimic work like activity without being limited by her symptoms  Baseline: pain with return to upright position without weight  Goal status: INITIAL   2.  Pt will report no greater than 0/10 pain over 7 consecutive days to demonstrate maintained reduction in symptoms and improved tolerance to activity Baseline: 0/10-5/10 Goal status: INITIAL   3.  Pt will be ind in the management of their symptoms at home and in the community Baseline: progress education and interventions for home management Goal status: INITIAL    PLAN:  PT FREQUENCY: 1-2x/week  PT DURATION: other: 7 weeks  PLANNED INTERVENTIONS: 97110-Therapeutic exercises, 97530- Therapeutic activity, [id number]- Neuromuscular re-education, 97535- Self Care, [id number]- Manual therapy, and Patient/Family education.  PLAN FOR NEXT SESSION: core strength and stability, progress tolerance to end range movements of the lumbar spine, soft tissue mobilization to thoracolumbar region with emphasis to paraspinals   Stann DELENA Ohara, PT 09/26/2024, 11:19  AM     Referring diagnosis? M54.50 (ICD-10-CM) - Acute right-sided low back pain without sciatica Treatment diagnosis? (if different than referring diagnosis) Other low back pain [M54.59] What was this (referring dx) caused by? []  Surgery []  Fall []  Ongoing  issue []  Arthritis [x]  Other: ____reaching for item________  Laterality: [x]  Rt []  Lt []  Both  Check all possible CPT codes:  *CHOOSE 10 OR LESS*    See Planned Interventions listed in the Plan section of the Evaluation.

## 2024-09-27 ENCOUNTER — Other Ambulatory Visit: Payer: Self-pay | Admitting: Nurse Practitioner

## 2024-09-28 ENCOUNTER — Telehealth: Payer: Self-pay

## 2024-09-28 ENCOUNTER — Other Ambulatory Visit: Payer: Self-pay | Admitting: *Deleted

## 2024-09-28 DIAGNOSIS — M7989 Other specified soft tissue disorders: Secondary | ICD-10-CM

## 2024-09-28 NOTE — Telephone Encounter (Signed)
 Spoke with Ike (daughter in law), patient was present at the time of call. I heard patients voice to confirm her presents. I discussed that patient was referred to the Kidney specialist for stage 4 kidney disease and confirmed that our record DOES NOT show a diagnosis of kidney cancer. Heidi is planning on being present at next appointment in which patient plans to add Heidi and her biological daughter to her DPR paperwork

## 2024-09-28 NOTE — Telephone Encounter (Signed)
 Copied from CRM [id number]. Topic: Clinical - Medical Advice >> Sep 28, 2024  1:27 PM Farrel B wrote: Reason for CRM: [id number] daughter of patient Michele Page, called with the pt in her presence and needed to    Please confirm whether the patient had requested neurology or nephrology referral the daughter stated that she is finding her mother to becoming confused and is very concerned with everything going on. She also states that her mother Michele Page informed her that she has stage 4 kidney cancer, but she is trying to see if cancer or kidney disease. Please call Daughter back to advise.

## 2024-09-28 NOTE — Progress Notes (Signed)
 Received call from pt with complaint of severe right arm swelling and pain x 2 weeks.  Pt denies recent injury or trauma.  Per MD pt needing Vas US  to r/o DVT.  If Vas US  negative, pt needing to be seen by cancer rehab for lymphedema tx.  Orders placed, pt notified and transferred to Vas US  to schedule.

## 2024-09-29 ENCOUNTER — Telehealth: Payer: Self-pay | Admitting: *Deleted

## 2024-09-29 ENCOUNTER — Ambulatory Visit (HOSPITAL_COMMUNITY)
Admission: RE | Admit: 2024-09-29 | Discharge: 2024-09-29 | Disposition: A | Discharge status: Home / Self Care | Source: Ambulatory Visit | Attending: Vascular Surgery | Admitting: Vascular Surgery

## 2024-09-29 DIAGNOSIS — M7989 Other specified soft tissue disorders: Secondary | ICD-10-CM | POA: Insufficient documentation

## 2024-09-29 DIAGNOSIS — C50412 Malignant neoplasm of upper-outer quadrant of left female breast: Secondary | ICD-10-CM

## 2024-09-29 NOTE — Telephone Encounter (Signed)
 MD reviewed pt recent VAS US  of right extremity showing no evidence of DVT.  MD requesting referral be placed to lymphedema PT.  Orders placed, pt notified and verbalized understanding.

## 2024-10-02 ENCOUNTER — Encounter: Payer: Self-pay | Admitting: Nurse Practitioner

## 2024-10-02 NOTE — Telephone Encounter (Signed)
 Form Printed and placed in Jessica's folder to review and sign.

## 2024-10-03 ENCOUNTER — Ambulatory Visit: Attending: Internal Medicine | Admitting: Pharmacist Clinician (PhC)/ Clinical Pharmacy Specialist

## 2024-10-03 ENCOUNTER — Other Ambulatory Visit (HOSPITAL_COMMUNITY): Payer: Self-pay

## 2024-10-03 ENCOUNTER — Encounter: Payer: Self-pay | Admitting: Pharmacist Clinician (PhC)/ Clinical Pharmacy Specialist

## 2024-10-03 ENCOUNTER — Encounter: Payer: Self-pay | Admitting: Hematology and Oncology

## 2024-10-03 VITALS — BP 130/74 | HR 73

## 2024-10-03 DIAGNOSIS — I10 Essential (primary) hypertension: Secondary | ICD-10-CM

## 2024-10-03 MED ORDER — SPIRONOLACTONE 25 MG PO TABS
25.0000 mg | ORAL_TABLET | Freq: Every day | ORAL | 3 refills | Status: AC
Start: 2024-10-03 — End: 2025-01-01

## 2024-10-03 MED ORDER — OMRON 3 SERIES BP MONITOR DEVI
1.0000 | Freq: Every day | 0 refills | Status: DC
  Filled 2024-10-03: qty 1, 30d supply, fill #0

## 2024-10-03 NOTE — Addendum Note (Signed)
 Addended by: Rebekah Sprinkle L on: 10/03/2024 11:57 AM   Modules accepted: Orders

## 2024-10-03 NOTE — Assessment & Plan Note (Signed)
 Assessment: BP is uncontrolled in office BP 130/74 mmHg;  Notes home readings mostly 150-160/70-85, device not validated Tolerates nifedipine  and carvedilol well, without any side effects Denies SOB, palpitation, chest pain, headache Right arm edema, negative for DVT, has referral to PT for lymphadema Reiterated the importance of regular exercise and low salt diet   Plan:  Start taking spironolactone 25 mg daily Continue taking nifedipine  xl 90 mg daily and carvedilol 25 mg twice daily Daughter to send copy of recent home readings later today.  Patient to keep record of BP readings with heart rate and report to us  at the next visit Patient to follow up with Caitlin in 1 month  Labs ordered today:  BMET in 1 week

## 2024-10-03 NOTE — Progress Notes (Signed)
 Office Visit    Patient Name: MAKAYLYNN BONILLAS Date of Encounter: 10/03/2024  Primary Care Provider:  Caro Harlene POUR, NP Primary Cardiologist:  Dorn Lesches, MD  Chief Complaint    Hypertension - [facility]  Past Medical History   CKD 9/25 GFR 26  (SCr 2.03); sees nephrology  HLD On atorvastatin  20  CVA 2013, notes mild cognitive defects    No Known Allergies  History of Present Illness    RYANNA TESCHNER is a 68 y.o. female patient who was referred to the [facility] by Harlene Caro NP, and also a patient of Dr. Lesches.  KARISS LONGMIRE was diagnosed with hypertension in her late 80s. She did have high blood pressure with her pregnancies. It has been difficult to control. Blood pressure checked with arm cuff at home. Readings have been labile. Systolic BP 120s-190s. BP elevated midday into the evening in particular.  She was seen by Reche Finder last month, who noted patients reports her BP has been excellent up to 6 months ago. She attributes to the elevated blood pressure to her blood pressure medications. I cannot take that anymore feels she has developed a dependency on the medication and her blood pressure elevates shortly after her blood pressure pill. She reiterates assuredly that I know there is nothing wrong with my heart. Feels her BP medications are too low of doses. Disussed that higher mg does not necessarily mean stronger medication.   ACE/ARB previously deferred due to CKD.  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  Nephrology was contacted and agreeable to start spironolactone 25 mg daily with repeat BMET in 1 week and close monitoring  Today she is in the office with a daughter.  Family is helping to get all her appointments and treatments organized.  Had long discussion about her nifedipine , she feels that only the brand works.  Explained that she hasn't had brand in many years (filled once in 2022), finally noted  that the bottle says nifedipine .. generic for Procardia .  She believed she was getting brand.  Apparently last bottle did not have this notation.  Explained that her medications are good, that kidney function limits our use of a lot of other medications, and that if we add spironolactone, we should see an improvement.  Stressed that she is to continue with nifedipine  and carvedilol.    Blood Pressure Goal:  130/80  Current Medications: carvedilol 25 mg bid, nifedipine  90 mg daily  Home BP readings:  home readings by recall - 150-160/70-85; daughter will send via MyChart later today   Accessory Clinical Findings    Lab Results  Component Value Date   CREATININE 2.03 (H) 07/26/2024   BUN 26 (H) 07/26/2024   NA 142 07/26/2024   K 3.6 07/26/2024   CL 107 07/26/2024   CO2 19 (L) 07/26/2024   Lab Results  Component Value Date   ALT 12 07/25/2024   AST 20 07/25/2024   ALKPHOS 120 07/25/2024   BILITOT 0.5 07/25/2024   Lab Results  Component Value Date   HGBA1C 5.8 (H) 05/13/2020    Screening for Secondary Hypertension:      Relevant Labs/Studies:    Latest Ref Rng & Units 07/26/2024    6:00 AM 07/25/2024   11:43 AM 07/10/2024    9:55 AM  Basic Labs  Sodium 135 - 145 mmol/L 142  143  140   Potassium 3.5 - 5.1 mmol/L 3.6  3.8  3.5  Creatinine 0.44 - 1.00 mg/dL 7.96  7.79  7.89        Latest Ref Rng & Units 08/24/2024    3:19 PM 06/24/2018   10:45 AM  Thyroid    TSH 0.450 - 4.500 uIU/mL 0.506  1.70        Latest Ref Rng & Units 08/24/2024    3:17 PM  Renin/Aldosterone   Aldosterone 0.0 - 30.0 ng/dL 58.6   Aldos/Renin Ratio 0.0 - 30.0 66.0              08/24/2024    2:56 PM  Renovascular   Renal Artery US  Completed Yes      Home Medications    Current Outpatient Medications  Medication Sig Dispense Refill   allopurinol  (ZYLOPRIM ) 100 MG tablet TAKE 1/2 (ONE-HALF) TABLET BY MOUTH EVERY OTHER DAY 45 tablet 0   aspirin  EC 81 MG tablet Take 81 mg by mouth daily.  Swallow whole.     atorvastatin  (LIPITOR) 20 MG tablet TAKE 1 TABLET EVERY DAY 90 tablet 3   Blood Pressure Monitoring (OMRON 3 SERIES BP MONITOR) DEVI Use to self-monitor blood pressure as directed. 1 each 0   carvedilol (COREG) 25 MG tablet Take 1 tablet (25 mg total) by mouth 2 (two) times daily. 180 tablet 3   letrozole  (FEMARA ) 2.5 MG tablet TAKE 1 TABLET EVERY DAY 90 tablet 3   NIFEdipine  (PROCARDIA  XL/NIFEDICAL-XL) 90 MG 24 hr tablet Take 1 tablet (90 mg total) by mouth daily. 90 tablet 1   oxybutynin  (DITROPAN ) 5 MG tablet Take 10 mg by mouth daily.     palbociclib  (IBRANCE ) 75 MG tablet Take 1 tablet (75 mg total) by mouth daily. Take for 21 days on, 7 days off, repeat every 28 days. 21 tablet 3   traMADol  (ULTRAM ) 50 MG tablet Take 1 tablet (50 mg total) by mouth every 12 (twelve) hours as needed. 30 tablet 0   zolpidem  (AMBIEN ) 5 MG tablet Take 1 tablet (5 mg total) by mouth at bedtime as needed for sleep. 15 tablet 1   No current facility-administered medications for this visit.     Assessment & Plan       Accelerated hypertension Assessment: BP is uncontrolled in office BP 130/74 mmHg;  Notes home readings mostly 150-160/70-85, device not validated Tolerates nifedipine  and carvedilol well, without any side effects Denies SOB, palpitation, chest pain, headache Right arm edema, negative for DVT, has referral to PT for lymphadema Reiterated the importance of regular exercise and low salt diet   Plan:  Start taking spironolactone 25 mg daily Continue taking nifedipine  xl 90 mg daily and carvedilol 25 mg twice daily Daughter to send copy of recent home readings later today.  Patient to keep record of BP readings with heart rate and report to us  at the next visit Patient to follow up with Caitlin in 1 month  Labs ordered today:  BMET in 1 week   Allean Mink PharmD CPP [facility]  [address] [place], [place] [address] [phone]

## 2024-10-03 NOTE — Patient Instructions (Addendum)
 Follow up appointment: with Reche Finder on December 18 at 8:50 am at the [facility] office  Go to the lab Monday or Tuesday next week to check kidney function and electrolytes.  Take your BP meds as follows:  START SPIRONOLACTONE 25 MG ONCE DAILY   CONTINUE WITH NIFEDIPINE  AND CARVEDILOL  Check your blood pressure at home daily and keep record of the readings.  Your blood pressure goal is < 130/80  To check your pressure at home you will need to:  1. Sit up in a chair, with feet flat on the floor and back supported. Do not cross your ankles or legs. 2. Rest your left arm so that the cuff is about heart level. If the cuff goes on your upper arm,  then just relax the arm on the table, arm of the chair or your lap. If you have a wrist cuff, we  suggest relaxing your wrist against your chest (think of it as Pledging the Flag with the  wrong arm).  3. Place the cuff snugly around your arm, about 1 inch above the crook of your elbow. The  cords should be inside the groove of your elbow.  4. Sit quietly, with the cuff in place, for about 5 minutes. After that 5 minutes press the power  button to start a reading. 5. Do not talk or move while the reading is taking place.  6. Record your readings on a sheet of paper. Although most cuffs have a memory, it is often  easier to see a pattern developing when the numbers are all in front of you.  7. You can repeat the reading after 1-3 minutes if it is recommended  Make sure your bladder is empty and you have not had caffeine or tobacco within the last 30 min  Always bring your blood pressure log with you to your appointments. If you have not brought your monitor in to be double checked for accuracy, please bring it to your next appointment.  You can find a list of quality blood pressure cuffs at [email or web address]  Important lifestyle changes to control high blood pressure  Intervention  Effect on the BP  Lose extra pounds and watch your  waistline Weight loss is one of the most effective lifestyle changes for controlling blood pressure. If you're overweight or obese, losing even a small amount of weight can help reduce blood pressure. Blood pressure might go down by about 1 millimeter of mercury (mm Hg) with each kilogram (about 2.2 pounds) of weight lost.  Exercise regularly As a general goal, aim for at least 30 minutes of moderate physical activity every day. Regular physical activity can lower high blood pressure by about 5 to 8 mm Hg.  Eat a healthy diet Eating a diet rich in whole grains, fruits, vegetables, and low-fat dairy products and low in saturated fat and cholesterol. A healthy diet can lower high blood pressure by up to 11 mm Hg.  Reduce salt (sodium) in your diet Even a small reduction of sodium in the diet can improve heart health and reduce high blood pressure by about 5 to 6 mm Hg.  Limit alcohol One drink equals 12 ounces of beer, 5 ounces of wine, or 1.5 ounces of 80-proof liquor.  Limiting alcohol to less than one drink a day for women or two drinks a day for men can help lower blood pressure by about 4 mm Hg.   If you have any questions or concerns please use My Chart  to send questions or call the office at [phone]

## 2024-10-04 ENCOUNTER — Telehealth: Payer: Self-pay | Admitting: Pharmacist Clinician (PhC)/ Clinical Pharmacy Specialist

## 2024-10-04 NOTE — Telephone Encounter (Signed)
 For chart records

## 2024-10-04 NOTE — Telephone Encounter (Signed)
-----   Message from Reche GORMAN Finder sent at 09/27/2024  9:42 AM EST ----- Regarding: 11/18 HTN visit FYI you're seeing her 11/18.   I reached out to nephrology to get permission for spironolactone  given her renal function. Labs consistent with hyperaldosteronism (no need for salt loading as the aldo was >23). However, her home BP readings she reported back were reasonable so didn't want to make the change.   Change could be made at her 11/18 visit. I do wonder if we get her on Spiro if would be able to drop some of the other antihypertensives.  Caitlin ----- Message ----- From: Norine Manuelita LABOR, MD Sent: 09/14/2024   2:16 PM EST To: Reche GORMAN Finder, NP Subject: RE: Hyperaldosteronism question                Absolutely would use it but just monitor K - check BMP 5-7d after starting 25mg  initial dose.   Thanks for checking. ----- Message ----- From: Finder Reche GORMAN, NP Sent: 09/04/2024   1:21 PM EDT To: Manuelita LABOR Norine, MD Subject: Hyperaldosteronism question                    Hi Dr. Norine,  We are seeing Michele Page as a mutual patient. I saw her recently in [facility]. Her follow up labs were consistent with hyperaldosteronism (renin 0.626, aldosterone 41.3, aldos/renin ratio 66) for which I would usually start Spironolactone  but hesitant with her renal function. Wanted your input before proceeding. Prior CT 07/2024 with thickened adrenal but no adenomas.   Thoughts on treatment of hyperaldosteronism?  Best,  Reche GORMAN Finder, NP

## 2024-10-05 ENCOUNTER — Inpatient Hospital Stay: Attending: Hematology and Oncology

## 2024-10-05 ENCOUNTER — Ambulatory Visit: Admitting: Physical Therapy

## 2024-10-05 ENCOUNTER — Encounter: Payer: Self-pay | Admitting: Physical Therapy

## 2024-10-05 ENCOUNTER — Encounter: Payer: Self-pay | Admitting: Pharmacist Clinician (PhC)/ Clinical Pharmacy Specialist

## 2024-10-05 ENCOUNTER — Ambulatory Visit (HOSPITAL_COMMUNITY)
Admission: RE | Admit: 2024-10-05 | Discharge: 2024-10-05 | Disposition: A | Discharge status: Home / Self Care | Source: Ambulatory Visit | Attending: Hematology and Oncology | Admitting: Hematology and Oncology

## 2024-10-05 DIAGNOSIS — C50412 Malignant neoplasm of upper-outer quadrant of left female breast: Secondary | ICD-10-CM | POA: Insufficient documentation

## 2024-10-05 DIAGNOSIS — C779 Secondary and unspecified malignant neoplasm of lymph node, unspecified: Secondary | ICD-10-CM | POA: Insufficient documentation

## 2024-10-05 DIAGNOSIS — M25511 Pain in right shoulder: Secondary | ICD-10-CM | POA: Diagnosis not present

## 2024-10-05 DIAGNOSIS — R911 Solitary pulmonary nodule: Secondary | ICD-10-CM | POA: Diagnosis not present

## 2024-10-05 DIAGNOSIS — J439 Emphysema, unspecified: Secondary | ICD-10-CM | POA: Diagnosis not present

## 2024-10-05 DIAGNOSIS — Z170 Estrogen receptor positive status [ER+]: Secondary | ICD-10-CM | POA: Insufficient documentation

## 2024-10-05 DIAGNOSIS — Z17411 Hormone receptor positive with human epidermal growth factor receptor 2 negative status: Secondary | ICD-10-CM | POA: Diagnosis not present

## 2024-10-05 DIAGNOSIS — M5459 Other low back pain: Secondary | ICD-10-CM

## 2024-10-05 DIAGNOSIS — C50919 Malignant neoplasm of unspecified site of unspecified female breast: Secondary | ICD-10-CM | POA: Diagnosis not present

## 2024-10-05 LAB — CBC WITH DIFFERENTIAL (CANCER CENTER ONLY)
Abs Immature Granulocytes: 0.01 K/uL (ref 0.00–0.07)
Basophils Absolute: 0.1 K/uL (ref 0.0–0.1)
Basophils Relative: 2 %
Eosinophils Absolute: 0 K/uL (ref 0.0–0.5)
Eosinophils Relative: 1 %
HCT: 29.4 % — ABNORMAL LOW (ref 36.0–46.0)
Hemoglobin: 10.1 g/dL — ABNORMAL LOW (ref 12.0–15.0)
Immature Granulocytes: 0 %
Lymphocytes Relative: 22 %
Lymphs Abs: 0.6 K/uL — ABNORMAL LOW (ref 0.7–4.0)
MCH: 34.4 pg — ABNORMAL HIGH (ref 26.0–34.0)
MCHC: 34.4 g/dL (ref 30.0–36.0)
MCV: 100 fL (ref 80.0–100.0)
Monocytes Absolute: 0.2 K/uL (ref 0.1–1.0)
Monocytes Relative: 8 %
Neutro Abs: 1.7 K/uL (ref 1.7–7.7)
Neutrophils Relative %: 67 %
Platelet Count: 282 K/uL (ref 150–400)
RBC: 2.94 MIL/uL — ABNORMAL LOW (ref 3.87–5.11)
RDW: 13.4 % (ref 11.5–15.5)
WBC Count: 2.6 K/uL — ABNORMAL LOW (ref 4.0–10.5)
nRBC: 0 % (ref 0.0–0.2)

## 2024-10-05 LAB — CMP (CANCER CENTER ONLY)
ALT: 13 U/L (ref 0–44)
AST: 29 U/L (ref 15–41)
Albumin: 4.7 g/dL (ref 3.5–5.0)
Alkaline Phosphatase: 125 U/L (ref 38–126)
Anion gap: 13 (ref 5–15)
BUN: 21 mg/dL (ref 8–23)
CO2: 25 mmol/L (ref 22–32)
Calcium: 9.7 mg/dL (ref 8.9–10.3)
Chloride: 104 mmol/L (ref 98–111)
Creatinine: 1.8 mg/dL — ABNORMAL HIGH (ref 0.44–1.00)
GFR, Estimated: 30 mL/min — ABNORMAL LOW (ref >60)
Glucose, Bld: 124 mg/dL — ABNORMAL HIGH (ref 70–99)
Potassium: 3.8 mmol/L (ref 3.5–5.1)
Sodium: 141 mmol/L (ref 135–145)
Total Bilirubin: 0.3 mg/dL (ref 0.0–1.2)
Total Protein: 7.2 g/dL (ref 6.5–8.1)

## 2024-10-05 NOTE — Therapy (Addendum)
 OUTPATIENT PHYSICAL THERAPY THORACOLUMBAR TREATMENT/DISCHARGE   PHYSICAL THERAPY DISCHARGE SUMMARY  Visits from Start of Care: 3  Current functional level related to goals / functional outcomes: Goals met   Remaining deficits: None noted   Education / Equipment: HEP taper, symptom management, self screening   Patient agrees to discharge. Patient goals were met. Patient is being discharged due to meeting the stated rehab goals.   Patient Name: Michele Page MRN: [id number] DOB:October 15, 1956, 68 y.o., female Today's Date: 10/05/2024  END OF SESSION:  PT End of Session - 10/05/24 0834     Visit Number 3    Number of Visits 12    Date for Recertification  11/02/24    Authorization Type [employer]    Authorization - Visit Number 3    Authorization - Number of Visits 10    Progress Note Due on Visit 10    PT Start Time 0920    PT Stop Time 0953    PT Time Calculation (min) 33 min    Activity Tolerance Patient tolerated treatment well    Behavior During Therapy [facility] for tasks assessed/performed            Past Medical History:  Diagnosis Date   Anemia    past hx many yrs ago    Arthritis    lt knee   Breast cancer ([name])    Both breasts   Breast cancer of 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) 04/02/2016   Chronic kidney disease    CKD   Colon polyps    COPD (chronic obstructive pulmonary disease) ([name])    GERD (gastroesophageal reflux disease)    years ago- not current    Hyperlipidemia    Hypertension    Multiple sclerosis    Personal history of chemotherapy    Personal history of radiation therapy    Stroke ([name])    2013, mild cognitive deficits   Vision abnormalities    Past Surgical History:  Procedure Laterality Date   BREAST BIOPSY Left 03/31/2016   BREAST BIOPSY Right 07/24/2021   BREAST LUMPECTOMY Left 04/17/2016   BREAST LUMPECTOMY Right 09/02/2021   BREAST LUMPECTOMY WITH RADIOACTIVE SEED AND SENTINEL LYMPH NODE BIOPSY Right  09/02/2021   Procedure: RIGHT BREAST LUMPECTOMY WITH RADIOACTIVE SEED AND SENTINEL LYMPH NODE BIOPSY;  Surgeon: Vanderbilt Ned, MD;  Location: [facility];  Service: General;  Laterality: Right;   COLONOSCOPY     HYSTEROSCOPY WITH D & C N/A 12/19/2015   Procedure: DILATATION AND CURETTAGE /HYSTEROSCOPY;  Surgeon: Duwaine Blumenthal, DO;  Location: [facility];  Service: Gynecology;  Laterality: N/A;   POLYPECTOMY     PORTACATH PLACEMENT Right 10/16/2021   Procedure: INSERTION PORT-A-CATH;  Surgeon: Vanderbilt Ned, MD;  Location: [facility];  Service: General;  Laterality: Right;   RADIOACTIVE SEED GUIDED PARTIAL MASTECTOMY WITH AXILLARY SENTINEL LYMPH NODE BIOPSY Left 04/17/2016   Procedure: RADIOACTIVE SEED GUIDED PARTIAL MASTECTOMY WITH AXILLARY SENTINEL LYMPH NODE BIOPSY;  Surgeon: Ned Vanderbilt, MD;  Location: [facility];  Service: General;  Laterality: Left;  RADIOACTIVE SEED GUIDED PARTIAL MASTECTOMY WITH AXILLARY SENTINEL LYMPH NODE BIOPSY    TUBAL LIGATION     VAGINAL DELIVERY     x3   Patient Active Problem List   Diagnosis Date Noted   Pain in right shoulder 09/19/2024   Positive D dimer 07/25/2024   RLQ abdominal pain 07/25/2024   Leukopenia 07/25/2024   Macrocytic anemia 07/25/2024   Elevated troponin 07/25/2024  Overactive bladder 06/05/2024   Chronic gout of left hand 06/05/2024   Morbid obesity ([name]) 06/05/2024   Port-A-Cath in place 11/06/2021   Malignant neoplasm of upper-outer quadrant of right breast in female, estrogen receptor positive ([name]) 08/04/2021   Neck pain on right side 06/01/2018   History of breast cancer 11/25/2017   Primary insomnia 11/25/2017   Breast cancer of 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) 04/02/2016   Essential hypertension 01/10/2016   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 01/10/2016   Gastroesophageal reflux disease without esophagitis 01/10/2016    Hyperlipidemia 01/10/2016   CKD (chronic kidney disease) stage 4, GFR 15-29 ml/min ([name]) 01/10/2016   Multiple sclerosis 03/20/2015   Accelerated hypertension 03/20/2015   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/2015    PCP: Caro Harlene POUR, NP  REFERRING PROVIDER: Caro Harlene POUR, NP  REFERRING DIAG: M54.50 (ICD-10-CM) - Acute right-sided low back pain without sciatica  Rationale for Evaluation and Treatment: Rehabilitation  THERAPY DIAG:  Other low back pain  ONSET DATE: 3 weeks ago  SUBJECTIVE:                                                                                                                                                                                           SUBJECTIVE STATEMENT: Pt states that she has been doing well since her previous session and reports compliance with HEP. Pt states that she has not had any symptoms since her previous session, reports return to normal activity and work, and full ind in the management of her symptoms.     PERTINENT HISTORY:  From Referring provider visit: She describes a recent onset of low back pain, which began about two weeks ago after reaching for something and experiencing a pull. The pain is localized to the lower back and does not radiate down the leg. She rates the pain as an 8 out of 10 but continues to work despite the discomfort. She has not used heat or Tylenol  for the pain but wants pain relief. No numbness, tingling, or incontinence. Low back  pain/strain Acute low back strain with pain, no radiation or neurological symptoms. Previous prednisone  effective but has returned. - Order x-ray of the low back. - Recommend physical therapy - Advise heating pad use two to three times daily for 20-30 minutes. - Recommend Tylenol  1000 mg every 8 hours.  PAIN:  Are you having pain? No  PRECAUTIONS: None  RED FLAGS: None   WEIGHT BEARING RESTRICTIONS: No  FALLS:  Has patient  fallen in last 6 months? No  LIVING ENVIRONMENT: Lives with: lives with their spouse  Lives in: House/apartment Stairs: Yes: Internal: 1 flight steps; on right going up Has following equipment at home: None  OCCUPATION: [employer]  PLOF: Independent  PATIENT GOALS: Back feels better   NEXT MD VISIT: January 2026  OBJECTIVE:  Note: Objective measures were completed at Evaluation unless otherwise noted.  DIAGNOSTIC FINDINGS:  From 09/05/24: IMPRESSION: 1. Advanced multilevel degenerative changes of the lumbar spine, predominately involving the facets. 2. Known osseous metastatic disease better visualized on prior CT.  PATIENT SURVEYS:  Modified Oswestry:  MODIFIED OSWESTRY DISABILITY SCALE   Date: 09/14/24 Score 10/05/24  Pain intensity 1 = The pain is bad, but I can manage without having to take pain medication 0  2. Personal care (washing, dressing, etc.) 0 =  I can take care of myself normally without causing increased pain. 0  3. Lifting 2 = Pain prevents me from lifting heavy weights off the floor,but I can manage if the weights are conveniently positioned (e.g. on a table) 0  4. Walking 0 = Pain does not prevent me from walking any distance 0  5. Sitting 0 =  I can sit in any chair as long as I like. 0  6. Standing 0 =  I can stand as long as I want without increased pain. 0  7. Sleeping 0 = Pain does not prevent me from sleeping well. 0  8. Social Life 0 = My social life is normal and does not increase my pain. 0  9. Traveling 0 =  I can travel anywhere without increased pain. 0  10. Employment/ Homemaking 0 = My normal homemaking/job activities do not cause pain. 0  Total 3/50 0/50   Interpretation of scores: Score Category Description  0-20% Minimal Disability The patient can cope with most living activities. Usually no treatment is indicated apart from advice on lifting, sitting and exercise  21-40% Moderate Disability The patient experiences more pain and  difficulty with sitting, lifting and standing. Travel and social life are more difficult and they may be disabled from work. Personal care, sexual activity and sleeping are not grossly affected, and the patient can usually be managed by conservative means  41-60% Severe Disability Pain remains the main problem in this group, but activities of daily living are affected. These patients require a detailed investigation  61-80% Crippled Back pain impinges on all aspects of the patient's life. Positive intervention is required  81-100% Bed-bound These patients are either bed-bound or exaggerating their symptoms  Bluford FORBES Zoe DELENA Karon DELENA, et al. Surgery versus conservative management of stable thoracolumbar fracture: the PRESTO feasibility RCT. [place] ([place]): [employer]; 2021 Nov. [name] Technology Assessment, No. 25.62.) Appendix 3, Oswestry Disability Index category descriptors. Available from: Findjewelers.cz  Minimally Clinically Important Difference (MCID) = 12.8%  COGNITION: Overall cognitive status: Within functional limits for tasks assessed     SENSATION: WFL   POSTURE: rounded shoulders, forward head, increased lumbar lordosis, and increased thoracic kyphosis  PALPATION: TTP in the right paraspinals. Does not radiate to the hip. Covers levels T12-L3  LUMBAR ROM:   AROM eval 09/26/24 10/05/24  Flexion Nil loss Nil loss, pain on return to standing Nil loss, no symptoms   Extension 50% loss, P! Nil loss Nil loss  Right lateral flexion Nil loss Nil loss Nil loss  Left lateral flexion Nil loss, P! R lumbar Nil loss, P! R lumbar Nil loss  Right rotation     Left rotation      (Blank rows = not  tested)    LOWER EXTREMITY MMT:  R hip flexion strength deficit residual from CVA 2013  MMT Right eval Left eval  Hip flexion 3+/5 5/5  Hip extension 5/5 5/5  Hip abduction 5/5 5/5  Hip adduction 5/5 5/5  Hip internal rotation    Hip  external rotation    Knee flexion 5/5 5/5  Knee extension 5/5 5/5  Ankle dorsiflexion 5/5 5/5  Ankle plantarflexion 5/5 5/5  Ankle inversion    Ankle eversion     (Blank rows = not tested)  GAIT: Distance walked: lobby to treatment area Assistive device utilized: None Level of assistance: Complete Independence Comments: inc time, reciprocal pattern, no major deficits noted  TREATMENT DATE:   [facility] Adult PT Treatment:                                                DATE: 10/05/24  Therapeutic Activity: PT POC reviewed and discussed, goals assessed and updated to reflect current status. Dead lift x10 with 30#, cues for trunk positioning and rationale Slouch overcorrect in sitting x15 reps  HEP revised and provided to reflect taper moving forward and self screening    [facility] Adult PT Treatment:                                                DATE: 09/26/24 Therapeutic Exercise: Repeated lumbar flexion in supine x10: prod, W with baseline of L side glide in standing, of note, pt has been completing active hip flexion which potentially provides lumbar extension moment in 90/90 position Slouch overcorrect x20 in sitting: NE on symptoms Repeated lumbar flexion in supine x10 with pt overpressure: dec, better  Seated W stretch with physioball x12 cycles  Supine hook lying alternating knee fall out x30 BLE alternating between reps.  Overhead lat stretch attempted with no noted elongation sensation-dc  Seated physioball press x30, 3 sec hold  HEP updated and provided    [facility] Adult PT Treatment:                                                DATE: 09/14/24 Therapeutic Exercise: Sustained thoracolumbar extension in prone prop x3 mins with symptoms yielding a dec, not better response.  Pt completes repeated lumbar extension in standing with a prod, worse response following 8 reps Pt completes repeated lumbar flexion in sitting x10 reps with NE on symptoms Repeated flexion in lying x10  yields a dec, better response with this included in HEP Slouch overcorrect x30 in sitting to complete mid range lumbar mobility and progress ROM as tolerated -included in HEP  PATIENT EDUCATION:  Education details: Pt educated on relevant anatomy, physiology, pathology, diagnosis, prognosis, progression of care, pain and activity modification related to right sided low back pain Person educated: Patient Education method: Explanation, Demonstration, and Handouts Education comprehension: verbalized understanding and returned demonstration  HOME EXERCISE PROGRAM: Access Code: [email or web address] URL: [email or web address] Date: 10/05/2024 Prepared by: Michele Page  Exercises - Supine Double Knee to Chest  - 2 x daily - 7 x weekly - 1 sets - 10 reps - 2 hold - Bent Knee Fallouts with Alternating Legs  - 1 x daily - 7 x weekly - 1 sets - 30 reps - 2 hold - Seated Abdominal Press into [employer]  - 1 x daily - 7 x weekly - 1 sets - 30 reps - 3 hold - Walking  - 1 x daily - 6 x weekly - mile 1 - slouch overcorrect on couch or chair   - 1 x daily - 7 x weekly - 1 sets - 25 reps - 2 hold - Lumbar screen  ASSESSMENT:  CLINICAL IMPRESSION: Pt has been responding well to her current regimen, visit 2 revealed centralizing symptoms and shortly after, abolished. Pt has been pain free since her previous session, has been back to work and completing all desired tasks without being limited by her symptoms. Pt provided option of dc today or in 1-2 weeks to trial ind without PT and pt elects to dc today. Pt demonstrates correct demonstration of new interventions. At this time, pt has completed skilled PT and has returned to PLOF without the presence of symptoms. Pt hereby dc from skilled PT at this time. Pt attended 3 sessions from 09/14/24-10/05/24. Thank you for allowing me  to participate in the care of Ms. Kiernan.   Eval: Patient is a 68 y.o. F who was seen today for physical therapy evaluation and treatment for right sided low back pain with onset 3 weeks ago when reaching for something. Pt has been overall improving since onset and reports that she continues to note symptoms with increased activity, but is able to work through the pain. Pt symptoms are present in the right side of the lumbar spine and there is pain that wraps from posterior to anterior consistent with T11/12 dermatomal pattern. Pt demonstrates decreased tolerance to repeated end range movements in the lumbar spine, especially in a loaded state. Provisional directional preference noted as repeated flexion in lying. Per chart review there is hx of mets to the lumbar region, without evidence of contribution to pt symptoms at this time.  Pt stands to benefit from continued skilled physical therapy to address deficit areas and restore safety with activities and participations at home and in the community.    OBJECTIVE IMPAIRMENTS: decreased activity tolerance, decreased endurance, decreased knowledge of condition, decreased ROM, and pain.   ACTIVITY LIMITATIONS: carrying, lifting, and bending  PARTICIPATION LIMITATIONS: cleaning, laundry, and occupation  PERSONAL FACTORS: 3+ comorbidities: Hx CVA, Hx Breast CA, Hx MS, Hx CKD are also affecting patient's functional outcome.   REHAB POTENTIAL: Good  CLINICAL DECISION MAKING: Stable/uncomplicated  EVALUATION COMPLEXITY: Low   GOALS: Goals reviewed with patient? Yes  SHORT TERM GOALS: Target date: 10/12/24   Pt will report compliance with HEP to work towards ind and home management strategies Baseline: provided today 11/20: revised to reflect taper moving forward s/p dc Goal status: MET   2.  Pt will score no greater than 0/50 on mod ODI to demonstrate improved activity tolerance Baseline: 3/50 10/05/24: 0/50  Goal status: MET   3.  Pt will  improve Lumbar ROM to full and painless in order to demonstrate progress towards activity tolerance and improved function Baseline: see ROM chart  10/05/24: full and painless Goal status: MET     LONG TERM GOALS: Target date: 11/02/24   Pt will complete 5 consecutive dead lifts with 30 pounds or greater to mimic work like activity without being limited by her symptoms  Baseline: pain with return to upright position without weight  10/05/24: able to return 10 reps with improving technique Goal status: MET   2.  Pt will report no greater than 0/10 pain over 7 consecutive days to demonstrate maintained reduction in symptoms and improved tolerance to activity Baseline: 0/10-5/10 10/05/24: 0/10 Goal status: MET   3.  Pt will be ind in the management of their symptoms at home and in the community Baseline: progress education and interventions for home management 10/05/24: Pt ind in symptom management Goal status: MET    PLAN:  PT FREQUENCY: 1-2x/week  PT DURATION: other: 7 weeks  PLANNED INTERVENTIONS: 97110-Therapeutic exercises, 97530- Therapeutic activity, [id number]- Neuromuscular re-education, 97535- Self Care, [id number]- Manual therapy, and Patient/Family education.  PLAN FOR NEXT SESSION: core strength and stability, progress tolerance to end range movements of the lumbar spine, soft tissue mobilization to thoracolumbar region with emphasis to paraspinals   Michele DELENA Page, PT 10/05/2024, 10:12 AM     Referring diagnosis? M54.50 (ICD-10-CM) - Acute right-sided low back pain without sciatica Treatment diagnosis? (if different than referring diagnosis) Other low back pain [M54.59] What was this (referring dx) caused by? []  Surgery []  Fall []  Ongoing issue []  Arthritis [x]  Other: ____reaching for item________  Laterality: [x]  Rt []  Lt []  Both  Check all possible CPT codes:  *CHOOSE 10 OR LESS*    See Planned Interventions listed in the Plan section of the Evaluation.

## 2024-10-06 LAB — CANCER ANTIGEN 27.29: CA 27.29: 37.3 U/mL (ref 0.0–38.6)

## 2024-10-09 ENCOUNTER — Telehealth: Payer: Self-pay

## 2024-10-09 NOTE — Therapy (Signed)
 OUTPATIENT PHYSICAL THERAPY  UPPER EXTREMITY ONCOLOGY EVALUATION  Patient Name: Michele Page MRN: [id number] DOB:02-23-1956, 68 y.o., female Today's Date: 10/10/2024  END OF SESSION:  PT End of Session - 10/10/24 1557     Visit Number 1    Number of Visits 12    Date for Recertification  12/05/24    Authorization Type [employer]    PT Start Time 1506    PT Stop Time 1554    PT Time Calculation (min) 48 min    Activity Tolerance Patient tolerated treatment well    Behavior During Therapy WFL for tasks assessed/performed          Past Medical History:  Diagnosis Date   Anemia    past hx many yrs ago    Arthritis    lt knee   Breast cancer ([name])    Both breasts   Breast cancer of 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) 04/02/2016   Chronic kidney disease    CKD   Colon polyps    COPD (chronic obstructive pulmonary disease) ([name])    GERD (gastroesophageal reflux disease)    years ago- not current    Hyperlipidemia    Hypertension    Multiple sclerosis    Personal history of chemotherapy    Personal history of radiation therapy    Stroke ([name])    2013, mild cognitive deficits   Vision abnormalities    Past Surgical History:  Procedure Laterality Date   BREAST BIOPSY Left 03/31/2016   BREAST BIOPSY Right 07/24/2021   BREAST LUMPECTOMY Left 04/17/2016   BREAST LUMPECTOMY Right 09/02/2021   BREAST LUMPECTOMY WITH RADIOACTIVE SEED AND SENTINEL LYMPH NODE BIOPSY Right 09/02/2021   Procedure: RIGHT BREAST LUMPECTOMY WITH RADIOACTIVE SEED AND SENTINEL LYMPH NODE BIOPSY;  Surgeon: Vanderbilt Ned, MD;  Location: [facility];  Service: General;  Laterality: Right;   COLONOSCOPY     HYSTEROSCOPY WITH D & C N/A 12/19/2015   Procedure: DILATATION AND CURETTAGE /HYSTEROSCOPY;  Surgeon: Duwaine Blumenthal, DO;  Location: [facility];  Service: Gynecology;  Laterality: N/A;   POLYPECTOMY     PORTACATH PLACEMENT Right 10/16/2021   Procedure: INSERTION  PORT-A-CATH;  Surgeon: Vanderbilt Ned, MD;  Location: [facility];  Service: General;  Laterality: Right;   RADIOACTIVE SEED GUIDED PARTIAL MASTECTOMY WITH AXILLARY SENTINEL LYMPH NODE BIOPSY Left 04/17/2016   Procedure: RADIOACTIVE SEED GUIDED PARTIAL MASTECTOMY WITH AXILLARY SENTINEL LYMPH NODE BIOPSY;  Surgeon: Ned Vanderbilt, MD;  Location: [facility];  Service: General;  Laterality: Left;  RADIOACTIVE SEED GUIDED PARTIAL MASTECTOMY WITH AXILLARY SENTINEL LYMPH NODE BIOPSY    TUBAL LIGATION     VAGINAL DELIVERY     x3   Patient Active Problem List   Diagnosis Date Noted   Pain in right shoulder 09/19/2024   Positive D dimer 07/25/2024   RLQ abdominal pain 07/25/2024   Leukopenia 07/25/2024   Macrocytic anemia 07/25/2024   Elevated troponin 07/25/2024   Overactive bladder 06/05/2024   Chronic gout of left hand 06/05/2024   Morbid obesity ([name]) 06/05/2024   Port-A-Cath in place 11/06/2021   Malignant neoplasm of upper-outer quadrant of right breast in female, estrogen receptor positive ([name]) 08/04/2021   Neck pain on right side 06/01/2018   History of breast cancer 11/25/2017   Primary insomnia 11/25/2017   Breast cancer of 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) 04/02/2016   Essential hypertension 01/10/2016   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 01/10/2016   Gastroesophageal reflux disease without esophagitis 01/10/2016   Hyperlipidemia 01/10/2016   CKD (chronic kidney disease) stage 4, GFR 15-29 ml/min ([name]) 01/10/2016   Multiple sclerosis 03/20/2015   Accelerated hypertension 03/20/2015   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/2015    PCP:    REFERRING PROVIDER: Mackey Chad MD  REFERRING DIAG: Rigth UE lymphedema  THERAPY DIAG:  Swelling of limb  Malignant neoplasm of upper-outer quadrant of left female breast, unspecified estrogen receptor status ([name])  Lymphedema, not  elsewhere classified  ONSET DATE: 6 months ago, would swell and reduce.  Rationale for Evaluation and Treatment: Rehabilitation  SUBJECTIVE:                                                                                                                                                                                           SUBJECTIVE STATEMENT:  Right arm Is swollen from the fingers to the UE. Swelling has been steady for a month and a half. Had a recent doppler and it was negative. Shoulder has hurt for the same length of time. Arm is very painful. Remembers wearing garments and she knows she has but not sure where they are. She wore them about 2-3 months in 2023 she thinks. Had a prior CVA in 2010 affecting right UE and ROM was never normal after that. Daughter Heidi in attendance.  PERTINENT HISTORY:  -History of right lumpectomy 09/02/21 with 4/6 nodes positive with chemotherapy and radiation completed, Hx of Lt lumpectomy in 2017 with 4 negative nodes removed and radiation.  Recently started Ibrance  09/28/22. Recent DVT study is negative, does show subcutaneous edema edema on dorsal aspect of right hand and distal forearm.   PAIN:  Are you having pain? Yes, pain starts in shoulder and she can't move it like she did NPRS scale:0/10 at rest  8/10 Pain location: Right shoulder Pain orientation: Right  PAIN TYPE: sharp and tight Pain description: intermittent  Aggravating factors: raising arm, doesn't lie on right side, Relieving factors: nothing  PRECAUTIONS: bil lymphedema risk , CVA affecting right UE 2010?  RED FLAGS: Bowel or bladder incontinence: Yes:     WEIGHT BEARING RESTRICTIONS: No  FALLS:  Has patient fallen in last 6 months? 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  COGNITION: Overall cognitive status: Within functional limits for tasks assessed   PALPATION: Mild pitting edema right ulnar border and dorsum of hand  OBSERVATIONS / OTHER ASSESSMENTS:   SENSATION: Light touch: Appears intact   POSTURE: rounded shoulders, forward head   UPPER EXTREMITY AROM/PROM:  A/PROM RIGHT   eval   Shoulder extension   Shoulder flexion 38, P!/PROM supine to approx 70 degrees  Shoulder abduction 38, P!  Shoulder internal rotation   Shoulder external rotation     (Blank rows = not tested)  A/PROM LEFT   eval  Shoulder extension   Shoulder flexion 151  Shoulder abduction 152  Shoulder internal rotation   Shoulder external rotation     (Blank rows = not tested)  CERVICAL AROM: All within normal limits:      UPPER EXTREMITY STRENGTH:   LYMPHEDEMA ASSESSMENTS:   SURGERY TYPE/DATE: right lumpectomy  with SLNB 09/02/21,Lt lumpectomy with SLNB in 2017  NUMBER OF LYMPH NODES REMOVED: 4+/6 right, 0+/4 left  CHEMOTHERAPY: YES  RADIATION:YES  HORMONE TREATMENT: YES  INFECTIONS: NO   LYMPHEDEMA ASSESSMENTS:   LANDMARK RIGHT  eval  At axilla  31.8  15 cm proximal to the proximal aspect of the olecranon process 30.2  10 cm proximal to the proximal aspect of the olecranon process 29.6  Olecranon process 27.0  15 cm proximal to the proximal aspect of the ulnar styloid process 26.3  10 cm proximal to the proximal aspect of the ulnar styloid process 23.6  Just distal to the ulnar styloid process 18.2  Across hand at thumb web space 22.7  At base of 2nd digit 6.9  (Blank rows = not tested)  LANDMARK LEFT  eval  At axilla  32.2  15 cm proximal to the proximal aspect of the olecranon process 30.2  10 cm proximal to the proximal aspect of  the olecranon process 27.4  Olecranon process 24.6  15 cm proximal to the proximal aspect of the ulnar styloid process 24.2  10 cm proximal to  the proximal aspect of the ulnar styloid process 21.0  Just distal to the ulnar styloid process 15.7  Across hand at thumb web space 21.0  At base of 2nd digit 6.4  (Blank rows = not tested)  Chest circumference just inferior to the axillae:  Chest circumference at the largest point:    Lymphedema life impact scale: 57%                                                                                                                            TREATMENT DATE:  Discussed with pt and her daughter how lymphedema is diagnosed, and what causes swelling after breast surgery with LN removal. Showed poster and how superficial lymph vessels are and why MLD is so gentle. Discussed bandaging as a means to reduce swelling, and then when arm is not changing get fit for compression  garments to maintain swelling. Might benefit from a night garment as well and showed them circaid profile. Showed pt AAROM for right shoulder flexion and practiced x 4. Did not remember to give pictures.    PATIENT EDUCATION:  Education details: how lymphedema is diagnosed, and what causes swelling after breast surgery with LN removal. Showed poster and how superficial lymph vessels are and why MLD is so gentle. Discussed bandaging as a means to reduce swelling, and then when arm is not changing get fit for compression garments to maintain swelling. Might benefit from a night garment as well and showed them circaid profile. Showed pt AAROM for right shoulder flexion and practiced x 4. Did not remember to give pictures. Discussed LOS, treatment interventions, and advised to schedule as soon as possible. She will need to check [employer] schedule before she calls. Also advised pt to find her compression garments from 2023 and bring them in so we can check them. Person educated: Patient and  Child(ren) Education method: Explanation Education comprehension: verbalized understanding and needs further education  HOME EXERCISE PROGRAM:    ASSESSMENT:  CLINICAL IMPRESSION: Patient is a 68 y.o. female who was seen today for physical therapy evaluation and treatment for complaints of right UE swelling which has been present for approximately 6 months. A recent US  ruled out blood clots. Pt also has significant limitations in Right shoulder ROM due to pain and stiffness, which has also been present for 6 months. She had a prior CVA affecting the right UE and its ROM . She has significant swelling from the mid Right upper arm into the hand and fingers. She was treated here previously for lymphedema and did practice MLD and bandaging at that time. Her daughter is in attendance with her today and feels that if pt can come here 2x/week, she can bandage the other days. Pt was asked to bring in her compression garments so we may check them. She will also require ROM exs to increase right shoulder mobility. Pt needs to check her schedule before calling to schedule appts.She will benefit from skilled PT to address deficits and return pt to PLOF.   OBJECTIVE IMPAIRMENTS: decreased activity tolerance, decreased knowledge of condition, decreased ROM, decreased strength, increased edema, impaired UE functional use, and pain.   ACTIVITY LIMITATIONS: lifting, sleeping, bathing, dressing, reach over head, and hygiene/grooming  PARTICIPATION LIMITATIONS: cleaning, shopping, community activity, and occupation  PERSONAL FACTORS: 1-2 comorbidities: bilateral breast cancer sx, chemo, radiation, lymphedema, shoulder pain are also affecting patient's functional outcome.   REHAB POTENTIAL: Good  CLINICAL DECISION MAKING: Evolving/moderate complexity  EVALUATION COMPLEXITY: Moderate  GOALS: Goals reviewed with patient? Yes  SHORT TERM GOALS: Target date: 11/07/2024  Pt will be independent in a HEP for  Right shoulder ROM Baseline: Goal status: INITIAL  2.  Pts daughter will be independent in compression bandaging to pts right UE Baseline:  Goal status: INITIAL  3.  Pt will be independent with MLD to the right UE Baseline:  Goal status: INITIAL  4.  Pts right shoulder pain will be reduced to no greater than 6/10 Baseline:  Goal status: INITIAL  5.  Pt will have AROM right shoulder to atleast 90 degrees flexion for improved reaching Baseline:  Goal status: INITIAL  6.  Pt will have 1 cm reduction at 15 cm prox to ulna styloid process Baseline:  Goal status: INITIAL  LONG TERM GOALS: Target date:  Pts edema reduction will plateau to allow pt to start in her  compression garments Baseline:  Goal status: INITIAL  2.  Pt will receive new compression garments prn, and may consider night garment prn Baseline:  Goal status: INITIAL  3.  Pts right shoulder pain will improve to no greater than 4/10 for improved function Baseline:  Goal status: INITIAL  4.  Pt will have right shoulder flexion atleast 125 degrees for improved reaching ability Baseline:  Goal status: INITIAL  PLAN:  PT FREQUENCY: 2x/week or 12 visits over 8 weeks  PT DURATION: 8 weeks  PLANNED INTERVENTIONS: [id number]- PT Re-evaluation, 97110-Therapeutic exercises, 97530- Therapeutic activity, 97112- Neuromuscular re-education, 97535- Self Care, [id number]- Manual therapy, 97760- Orthotic Initial, [phone]- Orthotic/Prosthetic subsequent, and Patient/Family education  PLAN FOR NEXT SESSION: Instruct daughter in compression bandaging, initiate AAROM for right shoulder, MLD,CDT, check previous garments when she brings,  Grayce JINNY Sheldon, PT 10/10/2024, 4:40 PM

## 2024-10-09 NOTE — Telephone Encounter (Signed)
 Per Caro Harlene POUR, NP patient needs an appointment (in person or video) to discuss FMLA paperwork. Per Harlene please clarify if this is related to cancer diagnosis, and yes patient needs to have oncologist fill out FMLA.  Spoke with Paden, per patient this is not specially related to cancer diagnosis (as far as she knows). Patient states she would be available on 10/19/24 @ 10$0 for a video visit, however I would need to call her daughter to see if that works for her.  Outgoing call and detailed message left for Jimmy questioning if 10/19/24 @ 10:40 would work for her. I asked that Jimmy return call either way so we can know how to proceed.

## 2024-10-10 ENCOUNTER — Encounter: Payer: Self-pay | Admitting: Family

## 2024-10-10 ENCOUNTER — Telehealth: Admitting: Family

## 2024-10-10 ENCOUNTER — Other Ambulatory Visit: Payer: Self-pay

## 2024-10-10 ENCOUNTER — Ambulatory Visit: Admitting: Physical Therapy

## 2024-10-10 ENCOUNTER — Telehealth: Payer: Self-pay | Admitting: Family

## 2024-10-10 ENCOUNTER — Ambulatory Visit: Attending: Hematology and Oncology

## 2024-10-10 ENCOUNTER — Encounter (HOSPITAL_BASED_OUTPATIENT_CLINIC_OR_DEPARTMENT_OTHER)

## 2024-10-10 ENCOUNTER — Other Ambulatory Visit

## 2024-10-10 DIAGNOSIS — M7989 Other specified soft tissue disorders: Secondary | ICD-10-CM | POA: Insufficient documentation

## 2024-10-10 DIAGNOSIS — C50412 Malignant neoplasm of upper-outer quadrant of left female breast: Secondary | ICD-10-CM | POA: Insufficient documentation

## 2024-10-10 DIAGNOSIS — I6930 Unspecified sequelae of cerebral infarction: Secondary | ICD-10-CM | POA: Diagnosis not present

## 2024-10-10 DIAGNOSIS — Z0289 Encounter for other administrative examinations: Secondary | ICD-10-CM

## 2024-10-10 DIAGNOSIS — Z170 Estrogen receptor positive status [ER+]: Secondary | ICD-10-CM

## 2024-10-10 DIAGNOSIS — I890 Lymphedema, not elsewhere classified: Secondary | ICD-10-CM | POA: Diagnosis not present

## 2024-10-10 DIAGNOSIS — C50411 Malignant neoplasm of upper-outer quadrant of right female breast: Secondary | ICD-10-CM | POA: Diagnosis not present

## 2024-10-10 DIAGNOSIS — M25511 Pain in right shoulder: Secondary | ICD-10-CM | POA: Diagnosis not present

## 2024-10-10 DIAGNOSIS — G8929 Other chronic pain: Secondary | ICD-10-CM | POA: Diagnosis not present

## 2024-10-10 LAB — BASIC METABOLIC PANEL WITH GFR
BUN/Creatinine Ratio: 10 — ABNORMAL LOW (ref 12–28)
BUN: 20 mg/dL (ref 8–27)
CO2: 20 mmol/L (ref 20–29)
Calcium: 9.8 mg/dL (ref 8.7–10.3)
Chloride: 104 mmol/L (ref 96–106)
Creatinine, Ser: 2 mg/dL — ABNORMAL HIGH (ref 0.57–1.00)
Glucose: 96 mg/dL (ref 70–99)
Potassium: 4.7 mmol/L (ref 3.5–5.2)
Sodium: 140 mmol/L (ref 134–144)
eGFR: 27 mL/min/1.73 — ABNORMAL LOW (ref >59)

## 2024-10-10 NOTE — Progress Notes (Signed)
 This service is provided via telemedicine  No vital signs collected/recorded due to the encounter was a telemedicine visit.   Location of patient (ex: home, work):  Home  Patient consents to a telephone visit: Yes  Location of the provider (ex: office, home):  [facility], Office   Name of any referring provider:  N/A  Names of all persons participating in the telemedicine service and their role in the encounter:  Shelli Ferrier, CMA, Patient, and Patient Daughter Kinjite and Emmaclaire Switala C,NP  Time spent on call:  9 min with medical assistant    Location:      Place of Service:    Provider: Ronda Kazmi FNP-C  Caro Harlene POUR, NP  Patient Care Team: Caro Harlene POUR, NP as PCP - General (Geriatric Medicine) Court Dorn PARAS, MD as PCP - Cardiology (Cardiology) Odean Potts, MD as Consulting Physician (Oncology) Vanderbilt Ned, MD as Consulting Physician (General Surgery) Lucila Norleen LABOR, RPH-CPP as Pharmacist (Hematology and Oncology) Albertus, Gordy HERO, MD as Consulting Physician (Gastroenterology)  Extended Emergency Contact Information Primary Emergency Contact: Massey,Tay Address: [address]          [place] , [place] [address] [place] Mobile Phone: [phone] Relation: Daughter Secondary Emergency Contact: Curet,Russell Address: [address]          [place], [place] [address] [place] Home Phone: [phone] Mobile Phone: [phone] Relation: Spouse  Code Status: Full Code  Goals of care: Advanced Directive information    10/10/2024    3:08 PM  Advanced Directives  Does Patient Have a Medical Advance Directive? No  Would patient like information on creating a medical advance directive? No - Patient declined     Chief Complaint  Patient presents with   Forms     Discuss FMLA paperwork.   Discussed the use of AI scribe software for clinical note transcription with the patient,  who gave verbal consent to proceed.  History of Present Illness   She is a 68 year old female who presents to complete FMLA paperwork. She receives emotional support and assistance with transportation for her medical appointments assisted by her daughter Jimmy Chute.  She has a history of cancer diagnosed nine years ago and has been seeing her oncologist every two months for follow-up. She is not currently undergoing chemotherapy or radiation therapy but taking oral medication.  She requires assistance with transportation to and back for her medical visit. Also requires emotional support. Daughter resides in [place] and has to drive quiet a distance to take her to her visit and assist. She will require at least 4-8 hrs to assist patient every 2-3 times per 2 months.  Past Medical History:  Diagnosis Date   Anemia    past hx many yrs ago    Arthritis    lt knee   Breast cancer ([name])    Both breasts   Breast cancer of 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) 04/02/2016   Chronic kidney disease    CKD   Colon polyps    COPD (chronic obstructive pulmonary disease) ([name])    GERD (gastroesophageal reflux disease)    years ago- not current    Hyperlipidemia    Hypertension    Multiple sclerosis    Personal history of chemotherapy    Personal history of radiation therapy    Stroke ([name])    2013, mild cognitive deficits   Vision abnormalities    Past Surgical History:  Procedure Laterality Date   BREAST BIOPSY Left 03/31/2016   BREAST BIOPSY Right 07/24/2021   BREAST LUMPECTOMY Left 04/17/2016   BREAST LUMPECTOMY Right 09/02/2021   BREAST LUMPECTOMY WITH RADIOACTIVE SEED AND SENTINEL LYMPH NODE BIOPSY Right 09/02/2021   Procedure: RIGHT BREAST LUMPECTOMY WITH RADIOACTIVE SEED AND SENTINEL LYMPH NODE BIOPSY;  Surgeon: Vanderbilt Ned, MD;  Location: [facility];  Service: General;  Laterality: Right;   COLONOSCOPY     HYSTEROSCOPY WITH D & C N/A 12/19/2015    Procedure: DILATATION AND CURETTAGE /HYSTEROSCOPY;  Surgeon: Duwaine Blumenthal, DO;  Location: [facility];  Service: Gynecology;  Laterality: N/A;   POLYPECTOMY     PORTACATH PLACEMENT Right 10/16/2021   Procedure: INSERTION PORT-A-CATH;  Surgeon: Vanderbilt Ned, MD;  Location: [facility];  Service: General;  Laterality: Right;   RADIOACTIVE SEED GUIDED PARTIAL MASTECTOMY WITH AXILLARY SENTINEL LYMPH NODE BIOPSY Left 04/17/2016   Procedure: RADIOACTIVE SEED GUIDED PARTIAL MASTECTOMY WITH AXILLARY SENTINEL LYMPH NODE BIOPSY;  Surgeon: Ned Vanderbilt, MD;  Location: [facility];  Service: General;  Laterality: Left;  RADIOACTIVE SEED GUIDED PARTIAL MASTECTOMY WITH AXILLARY SENTINEL LYMPH NODE BIOPSY    TUBAL LIGATION     VAGINAL DELIVERY     x3    No Known Allergies  Outpatient Encounter Medications as of 10/10/2024  Medication Sig   allopurinol  (ZYLOPRIM ) 100 MG tablet TAKE 1/2 (ONE-HALF) TABLET BY MOUTH EVERY OTHER DAY   aspirin  EC 81 MG tablet Take 81 mg by mouth daily. Swallow whole.   atorvastatin  (LIPITOR) 20 MG tablet TAKE 1 TABLET EVERY DAY   Blood Pressure Monitoring (OMRON 3 SERIES BP MONITOR) DEVI Use to self-monitor blood pressure as directed.   carvedilol  (COREG ) 25 MG tablet Take 1 tablet (25 mg total) by mouth 2 (two) times daily.   letrozole  (FEMARA ) 2.5 MG tablet TAKE 1 TABLET EVERY DAY   NIFEdipine  (PROCARDIA  XL/NIFEDICAL-XL) 90 MG 24 hr tablet Take 1 tablet (90 mg total) by mouth daily.   oxybutynin  (DITROPAN ) 5 MG tablet Take 10 mg by mouth daily.   palbociclib  (IBRANCE ) 75 MG tablet Take 1 tablet (75 mg total) by mouth daily. Take for 21 days on, 7 days off, repeat every 28 days.   spironolactone  (ALDACTONE ) 25 MG tablet Take 1 tablet (25 mg total) by mouth daily.   traMADol  (ULTRAM ) 50 MG tablet Take 1 tablet (50 mg total) by mouth every 12 (twelve) hours as needed.   zolpidem  (AMBIEN ) 5 MG tablet Take 1 tablet (5 mg total) by mouth at bedtime as  needed for sleep.   No facility-administered encounter medications on file as of 10/10/2024.    Review of Systems  Constitutional:  Negative for appetite change, chills, fatigue, fever and unexpected weight change.  HENT:  Negative for congestion, dental problem, ear discharge, ear pain, facial swelling, hearing loss, nosebleeds, postnasal drip, rhinorrhea, sinus pressure, sinus pain, sneezing, sore throat, tinnitus and trouble swallowing.   Eyes:  Negative for pain, discharge, redness, itching and visual disturbance.  Respiratory:  Negative for cough, chest tightness, shortness of breath and wheezing.   Cardiovascular:  Negative for chest pain, palpitations and leg swelling.  Gastrointestinal:  Negative for abdominal distention, abdominal pain, blood in stool, constipation, diarrhea, nausea and vomiting.  Endocrine: Negative for cold intolerance, heat intolerance, polydipsia, polyphagia and polyuria.  Genitourinary:  Negative for difficulty urinating, dysuria, flank pain, frequency and urgency.  Musculoskeletal:  Negative for arthralgias, back pain, gait problem, joint swelling, myalgias, neck pain  and neck stiffness.  Skin:  Negative for color change, pallor, rash and wound.  Neurological:  Negative for dizziness, syncope, speech difficulty, weakness, light-headedness, numbness and headaches.  Hematological:  Does not bruise/bleed easily.  Psychiatric/Behavioral:  Negative for agitation, behavioral problems, confusion, hallucinations, self-injury, sleep disturbance and suicidal ideas. The patient is not nervous/anxious.     Immunization History  Administered Date(s) Administered   Influenza,inj,Quad PF,6+ Mos 08/07/2015, 08/06/2016, 11/24/2017, 10/26/2018, 07/12/2019   PPD Test 07/13/2017   Pneumococcal Conjugate-13 04/18/2021   Pertinent  Health Maintenance Due  Topic Date Due   Bone Density Scan  04/22/2023   Influenza Vaccine  06/16/2024   Mammogram  02/22/2025   Colonoscopy   08/31/2029      05/28/2023    2:40 PM 07/20/2023    1:07 PM 10/13/2023    1:57 PM 11/29/2023    1:06 PM 08/22/2024    1:54 PM  Fall Risk  Falls in the past year? 0 0 0 0 0  Was there an injury with Fall? 0 0 0 0 0  Fall Risk Category Calculator 0 0 0 0 0  Patient at Risk for Falls Due to No Fall Risks No Fall Risks No Fall Risks No Fall Risks No Fall Risks  Fall risk Follow up Falls evaluation completed Falls evaluation completed Falls evaluation completed Falls evaluation completed Falls evaluation completed   Functional Status Survey:    There were no vitals filed for this visit. There is no height or weight on file to calculate BMI. Physical Exam Constitutional:      General: She is not in acute distress.    Appearance: She is not ill-appearing.  Pulmonary:     Effort: Pulmonary effort is normal. No respiratory distress.  Neurological:     Mental Status: She is alert and oriented to person, place, and time.  Psychiatric:        Mood and Affect: Mood normal.        Behavior: Behavior normal.    Labs reviewed: Recent Labs    07/26/24 0600 10/05/24 1030 10/10/24 1333  NA 142 141 140  K 3.6 3.8 4.7  CL 107 104 104  CO2 19* 25 20  GLUCOSE 91 124* 96  BUN 26* 21 20  CREATININE 2.03* 1.80* 2.00*  CALCIUM  9.5 9.7 9.8   Recent Labs    07/10/24 0955 07/25/24 1509 10/05/24 1030  AST 18 20 29   ALT 12 12 13   ALKPHOS 103 120 125  BILITOT 0.3 0.5 0.3  PROT 6.6 7.1 7.2  ALBUMIN  4.2 4.3 4.7   Recent Labs    07/10/24 0955 07/25/24 1143 07/26/24 0600 10/05/24 1030  WBC 3.1* 2.9* 2.5* 2.6*  NEUTROABS 1.8  --  1.4* 1.7  HGB 9.4* 9.8* 9.2* 10.1*  HCT 27.2* 30.1* 27.9* 29.4*  MCV 100.4* 102.7* 104.9* 100.0  PLT 219 417* 358 282   Lab Results  Component Value Date   TSH 0.506 08/24/2024   Lab Results  Component Value Date   HGBA1C 5.8 (H) 05/13/2020   Lab Results  Component Value Date   CHOL 146 09/04/2024   HDL 66 09/04/2024   LDLCALC 62 09/04/2024   TRIG  95 09/04/2024   CHOLHDL 2.2 09/04/2024    Significant Diagnostic Results in last 30 days:  CT CHEST ABDOMEN PELVIS WO CONTRAST Result Date: 10/11/2024 CLINICAL DATA:  Stage IV breast cancer. Restaging. * Tracking Code: BO * EXAM: CT CHEST, ABDOMEN AND PELVIS WITHOUT CONTRAST TECHNIQUE: Multidetector CT imaging of  the chest, abdomen and pelvis was performed following the standard protocol without IV contrast. RADIATION DOSE REDUCTION: This exam was performed according to the departmental dose-optimization program which includes automated exposure control, adjustment of the mA and/or kV according to patient size and/or use of iterative reconstruction technique. COMPARISON:  Abdomen pelvis CT 07/25/2024. Chest abdomen pelvis CT 03/21/2024. FINDINGS: CT CHEST FINDINGS Cardiovascular: Heart size upper normal. Trace pericardial effusion evident. Coronary artery calcification is evident. Moderate atherosclerotic calcification is noted in the wall of the thoracic aorta. Mediastinum/Nodes: Left thyroid  nodule again identified. This has been evaluated on previous imaging. (ref: J Am Coll Radiol. 2015 Feb;12(2): 143-50).No mediastinal lymphadenopathy. No evidence for gross hilar lymphadenopathy although assessment is limited by the lack of intravenous contrast on the current study. The esophagus has normal imaging features. There is no axillary lymphadenopathy. Surgical changes are noted in the right axillary region. Lungs/Pleura: Centrilobular emphsyema noted. 2 mm left upper lobe nodule on 36/6 is stable. 3 mm right upper lobe nodule on 50/6 is new since 03/21/2024. 2 mm nodule left base on 91/6 was probably present previously. No focal airspace consolidation. No pleural effusion. Musculoskeletal: Mixed density lesions at T11 and T12 are similar to the 07/25/2024 exam, suggesting metastatic involvement. CT ABDOMEN PELVIS FINDINGS Hepatobiliary: No suspicious focal abnormality in the liver on this study without  intravenous contrast. Gallbladder is nondistended. No intrahepatic or extrahepatic biliary dilation. Pancreas: No focal mass lesion. No dilatation of the main duct. No intraparenchymal cyst. No peripancreatic edema. Spleen: No splenomegaly. No suspicious focal mass lesion. Adrenals/Urinary Tract: No adrenal nodule or mass. Kidneys unremarkable. No evidence for hydroureter. The urinary bladder appears normal for the degree of distention. Stomach/Bowel: Stomach is unremarkable. No gastric wall thickening. No evidence of outlet obstruction. Duodenum is normally positioned as is the ligament of Treitz. No small bowel wall thickening. No small bowel dilatation. The terminal ileum is normal. The appendix is normal. No gross colonic mass. No colonic wall thickening. Diverticular changes are noted in the left colon without evidence of diverticulitis. Vascular/Lymphatic: There is advanced atherosclerotic calcification of the abdominal aorta without aneurysm. There is no gastrohepatic or hepatoduodenal ligament lymphadenopathy. No retroperitoneal or mesenteric lymphadenopathy. No pelvic sidewall lymphadenopathy. Reproductive: There is no adnexal mass. Other: No intraperitoneal free fluid. Musculoskeletal: There is a new focal lucency in the anterior aspect of the L1 vertebral body with disruption of the overlying superior endplate, concerning for new metastatic disease. Mixed density lesions in the iliac bones bilaterally, right greater than left are similar to prior compatible with metastatic involvement. IMPRESSION: 1. 3 mm right upper lobe pulmonary nodule is new since 03/21/2024. While likely infectious/inflammatory, close attention on follow-up studies recommended. 2. Interval development of focal lucency in the anterior aspect of the L1 vertebral body, suspicious for a potential new site of metastatic involvement. Lumbar spine MRI with and without contrast may prove helpful to further evaluate. 3. Stable appearance of  mixed density lesions in the T11 and T12 vertebral bodies and bilateral iliac bones compatible with metastatic involvement. 4. Aortic Atherosclerosis (ICD10-I70.0) and Emphysema (ICD10-J43.9). Electronically Signed   By: Camellia Candle M.D.   On: 10/11/2024 06:44   VAS US  UPPER EXTREMITY VENOUS DUPLEX Result Date: 09/29/2024 UPPER VENOUS STUDY  Patient Name:  ROYANNE WARSHAW  Date of Exam:   09/29/2024 Medical Rec #: [id number]     Accession #:    [id number] Date of Birth: [birth year]     Patient Gender: F Patient Age:   76 years  Exam Location:  [address] Procedure:      VAS US  UPPER EXTREMITY VENOUS DUPLEX Referring Phys: MACKEY CHAD --------------------------------------------------------------------------------  Indications: Right arm edema for several years following a CVA Performing Technologist: Geni Lodge RVS, RCS  Examination Guidelines: A complete evaluation includes B-mode imaging, spectral Doppler, color Doppler, and power Doppler as needed of all accessible portions of each vessel. Bilateral testing is considered an integral part of a complete examination. Limited examinations for reoccurring indications may be performed as noted.  Right Findings: +--------+------------+---------+-----------+----------+-------+ RIGHT   CompressiblePhasicitySpontaneousPropertiesSummary +--------+------------+---------+-----------+----------+-------+ IJV         Full                                          +--------+------------+---------+-----------+----------+-------+ Axillary    Full                                          +--------+------------+---------+-----------+----------+-------+ Brachial    Full                                          +--------+------------+---------+-----------+----------+-------+ Radial      Full                                          +--------+------------+---------+-----------+----------+-------+ Ulnar       Full                                           +--------+------------+---------+-----------+----------+-------+ Cephalic    Full                                          +--------+------------+---------+-----------+----------+-------+ Basilic     Full                                          +--------+------------+---------+-----------+----------+-------+  Summary:  Right: No evidence of deep vein thrombosis in the upper extremity.  No evidence of superficial vein thrombosis in the upper extremity.  *See table(s) above for measurements and observations.  Diagnosing physician: Norman Serve Electronically signed by Norman Serve on 09/29/2024 at 4:43:25 PM.    Final     Assessment/Plan  Left breast cancer /Forms Completion  Diagnosed nine years ago, currently under oncologist care with bi-monthly visits. No chemotherapy or radiation therapy. - Completed paperwork for assistance with activities of daily living, Emotional support and transportation. - Faxed completed paperwork to relevant parties. - Provided original copy of paperwork to her.   History of stroke  - Continue on ASA, - continue to control risk factors   Family/ staff Communication: Reviewed plan of care with patient verbalized understanding   Labs/tests ordered: None   Next Appointment: No follow-ups on file.  I connected with  Adrien JONELLE Sabal on 10/15/24 by a video enabled  telemedicine application and verified that I am speaking with the correct person using two identifiers.   I discussed the limitations of evaluation and management by telemedicine. The patient expressed understanding and agreed to proceed.   Total time: 20 minutes. Greater than 50% of total time spent doing patient education regarding Left breast cancer /Forms Completion,History of stroke and health maintenance including symptom/medication management.   Roxan JAYSON Plough, NP

## 2024-10-10 NOTE — Telephone Encounter (Signed)
 FMLA paper work completed.Please fax as requested and scan to Mychart or patient may pickup original copy. Paperwork placed in outgoing fax box.

## 2024-10-11 NOTE — Telephone Encounter (Signed)
 Per the patient's daughter's request, FMLA paperwork was scanned to her email, [email or web address]

## 2024-10-13 ENCOUNTER — Other Ambulatory Visit (HOSPITAL_COMMUNITY): Payer: Self-pay

## 2024-10-15 ENCOUNTER — Ambulatory Visit: Payer: Self-pay | Admitting: Pharmacist Clinician (PhC)/ Clinical Pharmacy Specialist

## 2024-10-16 ENCOUNTER — Other Ambulatory Visit (HOSPITAL_COMMUNITY): Payer: Self-pay

## 2024-10-16 NOTE — Assessment & Plan Note (Signed)
 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 systemic chemotherapy with CMF x6 cycles completed 01/30/2022 2. adjuvant radiation therapy completing 04/09/2022 3.  For the continued antiestrogen therapy (previously anastrozole  started 07/23/2016 switched to letrozole  2018 switched to tamoxifen  2018, switched back to anastrozole ) hair loss was the main problem, may consider exemestane --------------------------------------------------------------------------------------   Current treatment: Letrozole  with Ibrance .  Patient could not tolerate Verzinio Toxicities: Fatigue Leukopenia: ANC 1.9   Signatera:  06/12/2022: Positive (0.52) 09/02/2022: Positive (68.11) Mar 22, 2023: Positive (106.54) 07/06/2023: Positive (662) 09/13/2023: Positive (57.14) 03/07/2024: 109.06   CT CAP 07/10/2022: No metastatic disease.  Tiny pulmonary nodules nonspecific. CT CAP 04/02/2023: New peripherally sclerotic lesion superior endplate L2 suspicious for treated metastasis or Schmorl's node.  No other sites of metastatic disease identified CT CAP 07/06/2023: Subpectoral lymph node 7 mm (used to be 5 to 6 mm) ill-defined groundglass density right lower lobe bone metastases stable T11 L2 CT CAP 09/24/2023: Stable bone metastasis T11 L2 MRI neck: 10/26/2023: Left thyroid  nodule 2.5 cm unchanged, no findings in the neck 10/27/2023: Left leg  x-rays: Mild osteoarthritis 12/15/2023: CT abdomen: Stable T11 and L2 03/23/24: CT CAP: Stable T 11 and L2 10/11/2024: CT CAP: 3 mm right upper lobe lung nodule new, focal lucency L1 stable T11 and T12 and bilateral iliac bones   Elevated creatinine: I discussed with her to follow-up with nephrology.  Encouraged her to drink more fluids.  She has an appointment coming up with nephrology.   I discussed with her about switching her from Ibrance  to Ribociclib.

## 2024-10-17 ENCOUNTER — Inpatient Hospital Stay: Attending: Hematology and Oncology | Admitting: Hematology and Oncology

## 2024-10-17 VITALS — BP 123/54 | HR 79 | Temp 98.2°F | Resp 18 | Ht 62.0 in | Wt 188.6 lb

## 2024-10-17 DIAGNOSIS — Z79810 Long term (current) use of selective estrogen receptor modulators (SERMs): Secondary | ICD-10-CM | POA: Insufficient documentation

## 2024-10-17 DIAGNOSIS — Z17411 Hormone receptor positive with human epidermal growth factor receptor 2 negative status: Secondary | ICD-10-CM | POA: Insufficient documentation

## 2024-10-17 DIAGNOSIS — D6481 Anemia due to antineoplastic chemotherapy: Secondary | ICD-10-CM | POA: Insufficient documentation

## 2024-10-17 DIAGNOSIS — C50412 Malignant neoplasm of upper-outer quadrant of left female breast: Secondary | ICD-10-CM | POA: Diagnosis not present

## 2024-10-17 DIAGNOSIS — C50411 Malignant neoplasm of upper-outer quadrant of right female breast: Secondary | ICD-10-CM | POA: Insufficient documentation

## 2024-10-17 DIAGNOSIS — Z170 Estrogen receptor positive status [ER+]: Secondary | ICD-10-CM | POA: Diagnosis not present

## 2024-10-17 NOTE — Progress Notes (Signed)
 Patient Care Team: Caro Harlene POUR, NP as PCP - General (Geriatric Medicine) Court Dorn PARAS, MD as PCP - Cardiology (Cardiology) Odean Potts, MD as Consulting Physician (Oncology) Vanderbilt Ned, MD as Consulting Physician (General Surgery) Lucila Norleen LABOR, RPH-CPP as Pharmacist (Hematology and Oncology) Albertus, Gordy HERO, MD as Consulting Physician (Gastroenterology)  DIAGNOSIS:  Encounter Diagnosis  Name Primary?   Malignant neoplasm of upper-outer quadrant of left breast in female, estrogen receptor positive ([name]) Yes    SUMMARY OF ONCOLOGIC HISTORY: Oncology History  Breast cancer of 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)  03/31/2016 Initial Diagnosis   Screening detected left breast mass, 2 nodules, 1.9 x 1.6 x 0.8 cm= fat necrosis; 8 x 7 x 7 mm= grade 2 IDC ER 100%, PR 100%, HER-2 negative ratio 1.39, Ki-67 20%   04/17/2016 Surgery   Left lumpectomy ([name]): IDC grade 2, 1.3 cm, with DCIS, margins negative, 0/4 lymph nodes negative, T1 cN0 stage IA pathologic stage, ER 100%, PR 100%, HER-2 negative ratio 1.39, Ki-67 20% Oncotype DX score 20, 13% ROR, intermediate risk   04/17/2016 Oncotype testing   Recurrence score: 20; ROR 15% (intermediate risk)    05/27/2016 - 07/13/2016 Radiation Therapy   Adjuvant radiation therapy [facility]): Left breast treated with breath hold to 50.4 Gy in 28 fractions at 1.8 Gy/fraction.  Left breast boosted to 10 Gy in 5 fractions at 2 Gy/fraction   07/23/2016 -  Anti-estrogen oral therapy   Anastrozole  1 mg switched to letrozole  04/22/2017 due to hair loss, switch to tamoxifen  10/22/2017 due to hair loss from letrozole  as well   Malignant neoplasm of upper-outer quadrant of right breast in female, estrogen receptor positive ([name])  08/04/2021 Initial Diagnosis   Malignant neoplasm of upper-outer quadrant of right breast in female, estrogen receptor positive ([name])    Relapse/Recurrence   2.2 cm ILC ER 100%, PR 90%, Her 2 Neg, Ki 67: 25%,  Size 2.5 cm   08/04/2021 Cancer Staging   Staging form: Breast, AJCC 8th Edition - Clinical: Stage IB (cT2, cN0, cM0, G2, ER+, PR+, HER2-) - Signed by Odean Potts, MD on 08/04/2021 Stage prefix: Initial diagnosis Histologic grading system: 3 grade system   09/02/2021 Surgery   Right lumpectomy: Grade 2 ILC 3.1 cm, margins negative, LVI present, 4/6 lymph nodes macrometastases, 2 lymph nodes isolated tumor cells, ER 100%, PR 90%, Ki-67 25%, HER2 negative   10/17/2021 - 01/30/2022 Chemotherapy   Patient is on Treatment Plan : BREAST Adjuvant CMF IV q21d      Genetic Testing   Negative genetic testing. No pathogenic variants identified on the [employer] CustomNext+RNA panel. The report date is 03/04/2022.  The CustomNext-Cancer+RNAinsight panel offered by [employer] includes sequencing and rearrangement analysis for the following 47 genes:  APC, ATM, AXIN2, BARD1, BMPR1A, BRCA1, BRCA2, BRIP1, CDH1, CDK4, CDKN2A, CHEK2, DICER1, EPCAM, GREM1, HOXB13, MEN1, MLH1, MSH2, MSH3, MSH6, MUTYH, NBN, NF1, NF2, NTHL1, PALB2, PMS2, POLD1, POLE, PTEN, RAD51C, RAD51D, RECQL, RET, SDHA, SDHAF2, SDHB, SDHC, SDHD, SMAD4, SMARCA4, STK11, TP53, TSC1, TSC2, and VHL.  RNA data is routinely analyzed for use in variant interpretation for all genes.     CHIEF COMPLIANT: Follow-up to discuss scan results for metastatic breast cancer  HISTORY OF PRESENT ILLNESS:   History of Present Illness Michele Page is a 68 year old female with metastatic breast cancer who presents for follow-up regarding her blood counts and recent imaging results.  She is concerned about low blood counts. Current labs show WBC  2.6, hemoglobin 10.1, and ANC 1.7.  She has noticed bubbles in her urine and wonders if this will persist. She follows with nephrology and recently started spironolactone . Her creatinine improved from 2.2 to 1.8.  Recent imaging showed a 3 mm lung nodule and a new L1 vertebral body finding of uncertain chronicity. She has  chronic back pain. Prior x-rays showed multilevel arthritis. She completed physical therapy, but back pain persists.  She has right arm lymphedema that is managed with physical therapy and compression wrapping, with significant improvement in swelling.  Cancer markers including CA27-29 are within normal range. Recent scans show lesions in the lumbar spine and iliac bone. She has been on Ibrance  for about two years, and circulating tumor DNA levels have decreased.     ALLERGIES:  has no known allergies.  MEDICATIONS:  Current Outpatient Medications  Medication Sig Dispense Refill   allopurinol  (ZYLOPRIM ) 100 MG tablet TAKE 1/2 (ONE-HALF) TABLET BY MOUTH EVERY OTHER DAY 45 tablet 0   aspirin  EC 81 MG tablet Take 81 mg by mouth daily. Swallow whole.     atorvastatin  (LIPITOR) 20 MG tablet TAKE 1 TABLET EVERY DAY 90 tablet 3   carvedilol  (COREG ) 25 MG tablet Take 1 tablet (25 mg total) by mouth 2 (two) times daily. 180 tablet 3   letrozole  (FEMARA ) 2.5 MG tablet TAKE 1 TABLET EVERY DAY 90 tablet 3   NIFEdipine  (PROCARDIA  XL/NIFEDICAL-XL) 90 MG 24 hr tablet Take 1 tablet (90 mg total) by mouth daily. 90 tablet 1   oxybutynin  (DITROPAN ) 5 MG tablet Take 10 mg by mouth daily.     palbociclib  (IBRANCE ) 75 MG tablet Take 1 tablet (75 mg total) by mouth daily. Take for 21 days on, 7 days off, repeat every 28 days. 21 tablet 3   spironolactone  (ALDACTONE ) 25 MG tablet Take 1 tablet (25 mg total) by mouth daily. 90 tablet 3   traMADol  (ULTRAM ) 50 MG tablet Take 1 tablet (50 mg total) by mouth every 12 (twelve) hours as needed. 30 tablet 0   zolpidem  (AMBIEN ) 5 MG tablet Take 1 tablet (5 mg total) by mouth at bedtime as needed for sleep. 15 tablet 1   Blood Pressure Monitoring (OMRON 3 SERIES BP MONITOR) DEVI Use to self-monitor blood pressure as directed. (Patient not taking: Reported on 10/17/2024) 1 each 0   No current facility-administered medications for this visit.    PHYSICAL EXAMINATION: ECOG  PERFORMANCE STATUS: 1 - Symptomatic but completely ambulatory  Vitals:   10/17/24 0948  BP: (!) 123/54  Pulse: 79  Resp: 18  Temp: 98.2 F (36.8 C)  SpO2: 100%   Filed Weights   10/17/24 0948  Weight: 188 lb 9.6 oz (85.5 kg)    LABORATORY DATA:  I have reviewed the data as listed    Latest Ref Rng & Units 10/10/2024    1:33 PM 10/05/2024   10:30 AM 07/26/2024    6:00 AM  CMP  Glucose 70 - 99 mg/dL 96  875  91   BUN 8 - 27 mg/dL 20  21  26    Creatinine 0.57 - 1.00 mg/dL 7.99  8.19  7.96   Sodium 134 - 144 mmol/L 140  141  142   Potassium 3.5 - 5.2 mmol/L 4.7  3.8  3.6   Chloride 96 - 106 mmol/L 104  104  107   CO2 20 - 29 mmol/L 20  25  19    Calcium  8.7 - 10.3 mg/dL 9.8  9.7  9.5  Total Protein 6.5 - 8.1 g/dL  7.2    Total Bilirubin 0.0 - 1.2 mg/dL  0.3    Alkaline Phos 38 - 126 U/L  125    AST 15 - 41 U/L  29    ALT 0 - 44 U/L  13      Lab Results  Component Value Date   WBC 2.6 (L) 10/05/2024   HGB 10.1 (L) 10/05/2024   HCT 29.4 (L) 10/05/2024   MCV 100.0 10/05/2024   PLT 282 10/05/2024   NEUTROABS 1.7 10/05/2024    ASSESSMENT & PLAN:  Breast cancer of 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) 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, 1.3 cm, with DCIS, margins negative, 0/4 lymph nodes negative, T1 cN0 stage IA pathologic stage, ER 100%, PR 100%, HER-2 negative ratio 1.39, Ki-67 20% (Originally 2 nodules were detected on screening mammogram 1.9 cm= fat necrosis and 8 mm IDC) Oncotype DX score 20, 13% risk of recurrence, intermediate risk Adjuvant radiation therapy started 07/20/2017completed 07/13/2016     09/02/2021:Right lumpectomy: Grade 2 ILC 3.1 cm, margins negative, LVI present, 4/6 lymph nodes macrometastases, 2 lymph nodes isolated tumor cells, ER 100%, PR 90%, Ki-67 25%, HER2 negative   Treatment plan/summary: 1. systemic chemotherapy with CMF x6 cycles completed 01/30/2022 2. adjuvant radiation therapy completing 04/09/2022 3.  For the  continued antiestrogen therapy (previously anastrozole  started 07/23/2016 switched to letrozole  2018 switched to tamoxifen  2018, switched back to anastrozole ) hair loss was the main problem, may consider exemestane --------------------------------------------------------------------------------------   Current treatment: Letrozole  with Ibrance .  Patient could not tolerate Verzinio Toxicities: Fatigue Leukopenia: ANC 1.9   Signatera:  06/12/2022: Positive (0.52) 09/02/2022: Positive (68.11) Mar 22, 2023: Positive (106.54) 07/06/2023: Positive (662) 09/13/2023: Positive (57.14) 03/07/2024: 109.06   CT CAP 07/10/2022: No metastatic disease.  Tiny pulmonary nodules nonspecific. CT CAP 04/02/2023: New peripherally sclerotic lesion superior endplate L2 suspicious for treated metastasis or Schmorl's node.  No other sites of metastatic disease identified CT CAP 07/06/2023: Subpectoral lymph node 7 mm (used to be 5 to 6 mm) ill-defined groundglass density right lower lobe bone metastases stable T11 L2 CT CAP 09/24/2023: Stable bone metastasis T11 L2 MRI neck: 10/26/2023: Left thyroid  nodule 2.5 cm unchanged, no findings in the neck 10/27/2023: Left leg x-rays: Mild osteoarthritis 12/15/2023: CT abdomen: Stable T11 and L2 03/23/24: CT CAP: Stable T 11 and L2 10/11/2024: CT CAP: 3 mm right upper lobe lung nodule new, focal lucency L1 stable T11 and T12 and bilateral iliac bones   Elevated creatinine: Follows with nephrology   Discussion: Reviewed the scan results and the lab results: We will obtain an MRI of her lumbar spine for further assessment. Recommended participation in preempt clinical trial (asymptomatic bone metastases: Radiation therapy) Recommend bisphosphonate therapy with Xgeva every 3 months: Patient does not have any current dental issues.  Okay to proceed. Plan to repeat another MRI in 3 months to assess the L1 lesion which is new. Assessment & Plan Metastatic breast cancer to bone,  estrogen receptor positive, left upper-outer quadrant Disease stable with effective Ibrance . New L1 vertebral lesion requires further evaluation. Discussed radiation study participation. - Ordered MRI of the back to assess L1 vertebral body lesion. - Continue Ibrance  for at least three more months. - Referred to radiation oncology for discussion of potential participation in the Preempt study for asymptomatic bone disease. - Will administer Xgeva injection post-MRI to strengthen bones and prevent fractures.  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  Chronic kidney disease Improved kidney function with creatinine at 1.8. Ongoing nephrologist management.  Lymphedema of right upper extremity Significant improvement with compression therapy. - Continue compression therapy and wrapping for lymphedema management.      No orders of the defined types were placed in this encounter.  The patient has a good understanding of the overall plan. she agrees with it. she will call with any problems that may develop before the next visit here.  I personally spent a total of 45 minutes in the care of the patient today including preparing to see the patient, getting/reviewing separately obtained history, performing a medically appropriate exam/evaluation, counseling and educating, placing orders, referring and communicating with other health care professionals, documenting clinical information in the EHR, independently interpreting results, communicating results, and coordinating care.   Viinay K Rishit Burkhalter, MD 10/17/24

## 2024-10-18 ENCOUNTER — Other Ambulatory Visit: Payer: Self-pay

## 2024-10-18 NOTE — Progress Notes (Signed)
 Specialty Pharmacy Refill Coordination Note  Michele Page is a 68 y.o. female contacted today regarding refills of specialty medication(s) Palbociclib  (IBRANCE )   Patient requested Delivery   Delivery date: 10/23/24   Verified address: [address], [name] [address]   Medication will be filled on: 10/20/24

## 2024-10-19 ENCOUNTER — Other Ambulatory Visit: Payer: Self-pay

## 2024-10-19 ENCOUNTER — Ambulatory Visit: Attending: Hematology and Oncology | Admitting: Rehabilitation

## 2024-10-19 ENCOUNTER — Encounter: Payer: Self-pay | Admitting: Rehabilitation

## 2024-10-19 ENCOUNTER — Ambulatory Visit: Admitting: Physical Therapy

## 2024-10-19 DIAGNOSIS — M7989 Other specified soft tissue disorders: Secondary | ICD-10-CM | POA: Insufficient documentation

## 2024-10-19 DIAGNOSIS — I890 Lymphedema, not elsewhere classified: Secondary | ICD-10-CM | POA: Insufficient documentation

## 2024-10-19 DIAGNOSIS — M5459 Other low back pain: Secondary | ICD-10-CM | POA: Diagnosis present

## 2024-10-19 DIAGNOSIS — M25511 Pain in right shoulder: Secondary | ICD-10-CM | POA: Diagnosis present

## 2024-10-19 DIAGNOSIS — G8929 Other chronic pain: Secondary | ICD-10-CM | POA: Insufficient documentation

## 2024-10-19 DIAGNOSIS — Z170 Estrogen receptor positive status [ER+]: Secondary | ICD-10-CM | POA: Diagnosis present

## 2024-10-19 DIAGNOSIS — Z483 Aftercare following surgery for neoplasm: Secondary | ICD-10-CM | POA: Insufficient documentation

## 2024-10-19 DIAGNOSIS — C50411 Malignant neoplasm of upper-outer quadrant of right female breast: Secondary | ICD-10-CM | POA: Diagnosis present

## 2024-10-19 NOTE — Therapy (Signed)
 OUTPATIENT PHYSICAL THERAPY  UPPER EXTREMITY ONCOLOGY EVALUATION  Patient Name: Michele Page MRN: [id number] DOB:07-Jun-1956, 68 y.o., female Today's Date: 10/19/2024  END OF SESSION:  PT End of Session - 10/19/24 0757     Visit Number 2    Number of Visits 12    Date for Recertification  12/05/24    Authorization Type [employer]    Authorization - Visit Number 2    Authorization - Number of Visits 10    Progress Note Due on Visit 10    PT Start Time 0800    PT Stop Time 0857    PT Time Calculation (min) 57 min    Activity Tolerance Patient tolerated treatment well    Behavior During Therapy [facility] for tasks assessed/performed          Past Medical History:  Diagnosis Date   Anemia    past hx many yrs ago    Arthritis    lt knee   Breast cancer ([name])    Both breasts   Breast cancer of 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) 04/02/2016   Chronic kidney disease    CKD   Colon polyps    COPD (chronic obstructive pulmonary disease) ([name])    GERD (gastroesophageal reflux disease)    years ago- not current    Hyperlipidemia    Hypertension    Multiple sclerosis    Personal history of chemotherapy    Personal history of radiation therapy    Stroke ([name])    2013, mild cognitive deficits   Vision abnormalities    Past Surgical History:  Procedure Laterality Date   BREAST BIOPSY Left 03/31/2016   BREAST BIOPSY Right 07/24/2021   BREAST LUMPECTOMY Left 04/17/2016   BREAST LUMPECTOMY Right 09/02/2021   BREAST LUMPECTOMY WITH RADIOACTIVE SEED AND SENTINEL LYMPH NODE BIOPSY Right 09/02/2021   Procedure: RIGHT BREAST LUMPECTOMY WITH RADIOACTIVE SEED AND SENTINEL LYMPH NODE BIOPSY;  Surgeon: Vanderbilt Ned, MD;  Location: [facility];  Service: General;  Laterality: Right;   COLONOSCOPY     HYSTEROSCOPY WITH D & C N/A 12/19/2015   Procedure: DILATATION AND CURETTAGE /HYSTEROSCOPY;  Surgeon: Duwaine Blumenthal, DO;  Location: [facility];  Service:  Gynecology;  Laterality: N/A;   POLYPECTOMY     PORTACATH PLACEMENT Right 10/16/2021   Procedure: INSERTION PORT-A-CATH;  Surgeon: Vanderbilt Ned, MD;  Location: [facility];  Service: General;  Laterality: Right;   RADIOACTIVE SEED GUIDED PARTIAL MASTECTOMY WITH AXILLARY SENTINEL LYMPH NODE BIOPSY Left 04/17/2016   Procedure: RADIOACTIVE SEED GUIDED PARTIAL MASTECTOMY WITH AXILLARY SENTINEL LYMPH NODE BIOPSY;  Surgeon: Ned Vanderbilt, MD;  Location: [facility];  Service: General;  Laterality: Left;  RADIOACTIVE SEED GUIDED PARTIAL MASTECTOMY WITH AXILLARY SENTINEL LYMPH NODE BIOPSY    TUBAL LIGATION     VAGINAL DELIVERY     x3   Patient Active Problem List   Diagnosis Date Noted   Pain in right shoulder 09/19/2024   Positive D dimer 07/25/2024   RLQ abdominal pain 07/25/2024   Leukopenia 07/25/2024   Macrocytic anemia 07/25/2024   Elevated troponin 07/25/2024   Overactive bladder 06/05/2024   Chronic gout of left hand 06/05/2024   Morbid obesity ([name]) 06/05/2024   Port-A-Cath in place 11/06/2021   Malignant neoplasm of upper-outer quadrant of right breast in female, estrogen receptor positive ([name]) 08/04/2021   Neck pain on right side 06/01/2018   History of breast cancer 11/25/2017   Primary insomnia 11/25/2017  Breast cancer of 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) 04/02/2016   Essential hypertension 01/10/2016   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 01/10/2016   Gastroesophageal reflux disease without esophagitis 01/10/2016   Hyperlipidemia 01/10/2016   CKD (chronic kidney disease) stage 4, GFR 15-29 ml/min ([name]) 01/10/2016   Multiple sclerosis 03/20/2015   Accelerated hypertension 03/20/2015   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/2015    PCP:    REFERRING PROVIDER: Mackey Chad MD  REFERRING DIAG: Rigth UE lymphedema  THERAPY DIAG:  Swelling of limb  Lymphedema, not  elsewhere classified  Malignant neoplasm of upper-outer quadrant of right female breast, unspecified estrogen receptor status ([name])  Chronic right shoulder pain  Other low back pain  Malignant neoplasm of upper-outer quadrant of right breast in female, estrogen receptor positive ([name])  Aftercare following surgery for neoplasm  ONSET DATE: 6 months ago, would swell and reduce.  Rationale for Evaluation and Treatment: Rehabilitation  SUBJECTIVE:                                                                                                                                                                                           SUBJECTIVE STATEMENT:  Pt reports she is doing well since her initial visit. She did not remember her exercises to do at home. She currently does not have any pain. She has found her compression glove but has not found her compression sleeve yet.   PERTINENT HISTORY:  -History of right lumpectomy 09/02/21 with 4/6 nodes positive with chemotherapy and radiation completed, Hx of Lt lumpectomy in 2017 with 4 negative nodes removed and radiation.  Recently started Ibrance  09/28/22. Recent DVT study is negative, does show subcutaneous edema edema on dorsal aspect of right hand and distal forearm.   PAIN:  10/19/24: No pain currently   Eval:  Are you having pain? Yes, pain starts in shoulder and she can't move it like she did NPRS scale:0/10 at rest  8/10 Pain location: Right shoulder Pain orientation: Right  PAIN TYPE: sharp and tight Pain description: intermittent  Aggravating factors: raising arm, doesn't lie on right side, Relieving factors: nothing  PRECAUTIONS: bil lymphedema risk , CVA affecting right UE 2010?  RED FLAGS: Bowel or bladder incontinence: Yes:     WEIGHT BEARING RESTRICTIONS: No  FALLS:  Has patient fallen in last 6 months? 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  COGNITION: Overall cognitive status: Within functional limits for tasks assessed   PALPATION: Mild pitting edema right ulnar border and dorsum of hand  OBSERVATIONS / OTHER ASSESSMENTS:   SENSATION: Light touch: Appears intact   POSTURE: rounded shoulders, forward head   UPPER EXTREMITY AROM/PROM:  A/PROM RIGHT   eval   Shoulder extension   Shoulder flexion 38, P!/PROM supine to approx 70 degrees  Shoulder abduction 38, P!  Shoulder internal rotation   Shoulder external rotation     (Blank rows = not tested)  A/PROM LEFT   eval  Shoulder extension   Shoulder flexion 151  Shoulder abduction 152  Shoulder internal rotation   Shoulder external rotation     (Blank rows = not tested)  CERVICAL AROM: All within normal limits:      UPPER EXTREMITY STRENGTH:   LYMPHEDEMA ASSESSMENTS:   SURGERY TYPE/DATE: right lumpectomy  with SLNB 09/02/21,Lt lumpectomy with SLNB in 2017  NUMBER OF LYMPH NODES REMOVED: 4+/6 right, 0+/4 left  CHEMOTHERAPY: YES  RADIATION:YES  HORMONE TREATMENT: YES  INFECTIONS: NO   LYMPHEDEMA ASSESSMENTS:   LANDMARK RIGHT  eval  At axilla  31.8  15 cm proximal to the proximal aspect of the olecranon process 30.2  10 cm proximal to the proximal aspect of the olecranon process 29.6  Olecranon process 27.0  15 cm proximal to the proximal aspect of the ulnar styloid process 26.3  10 cm proximal to the proximal aspect of the ulnar styloid process 23.6  Just distal to the ulnar styloid process 18.2  Across hand at thumb web space 22.7  At base of 2nd digit 6.9  (Blank rows = not tested)  LANDMARK LEFT   eval  At axilla  32.2  15 cm proximal to the proximal aspect of the olecranon process 30.2  10 cm proximal to the proximal aspect of the olecranon process 27.4  Olecranon process 24.6  15 cm proximal to the proximal aspect of the ulnar styloid process 24.2  10 cm proximal to  the proximal aspect of the ulnar styloid process 21.0  Just distal to the ulnar styloid process 15.7  Across hand at thumb web space 21.0  At base of 2nd digit 6.4  (Blank rows = not tested)  Chest circumference just inferior to the axillae:  Chest circumference at the largest point:    Lymphedema life impact scale: 57%                                                                                                                            TREATMENT DATE:  Pt permission and consent throughout each step of examination and treatment with modification and draping if requested when working on sensitive areas.  Pt is aware that a clinic chaperone is  not available.   10/19/24 Therapeutic Exercise: Flexion table slides with ball on plinth x 10 Supine chest press with dowel x 10 Supine dowel flexion x 10  Manual Therapy:  In supine: Short neck, 5 diaphragmatic breaths, R inguinal nodes and establishment of axilloinguinal pathway, then R UE working proximal to distal, moving inner upper arm outwards and upwards, and doing both sides of forearms, spending extra time in any areas of fibrosis then retracing all steps - skipped L axillary nodes and pathway due to bilateral lymph node removal Then showed daughter Normand) link on [facility] and we made a video of her own as well.  Lotion, tg soft, fingers 1-4, artiflex hand to axilla, 6cm hand, 8cm and 10cm spiral from wrist to axilla with instruction.  Review of when to remove, watching for SOB, and remedial exercises    Eval: Discussed with pt and her daughter how lymphedema is diagnosed, and what causes swelling after breast surgery with LN removal. Showed poster and  how superficial lymph vessels are and why MLD is so gentle. Discussed bandaging as a means to reduce swelling, and then when arm is not changing get fit for compression garments to maintain swelling. Might benefit from a night garment as well and showed them circaid profile. Showed pt AAROM for right shoulder flexion and practiced x 4. Did not remember to give pictures.    PATIENT EDUCATION:  Education details: how lymphedema is diagnosed, and what causes swelling after breast surgery with LN removal. Showed poster and how superficial lymph vessels are and why MLD is so gentle. Discussed bandaging as a means to reduce swelling, and then when arm is not changing get fit for compression garments to maintain swelling. Might benefit from a night garment as well and showed them circaid profile. Showed pt AAROM for right shoulder flexion and practiced x 4. Did not remember to give pictures. Discussed LOS, treatment interventions, and advised to schedule as soon as possible. She will need to check [employer] schedule before she calls. Also advised pt to find her compression garments from 2023 and bring them in so we can check them. Person educated: Patient and Child(ren) Education method: Explanation Education comprehension: verbalized understanding and needs further education  HOME EXERCISE PROGRAM:    ASSESSMENT:  CLINICAL IMPRESSION: Patient tolerates the addition of Rt UE MLD with education provided to the patient about the purpose of MLD and how the lymphatic system works. The patient tolerates the addition of compression bandaging well with no increases in pain or discomfort following. The patient is educated to remove the bandaging if it becomes uncomfortable, painful, itchy, if she begins to have any SOB, or if she starts noticing swelling in the LE's. The patient's daughter-in-law was taught how to perform the compression wrapping at home. The patient would benefit from continued skilled physical  therapy to address the impairments listed below and improve her overall QoL.    OBJECTIVE IMPAIRMENTS: decreased activity tolerance, decreased knowledge of condition, decreased ROM, decreased strength, increased edema, impaired UE functional use, and pain.   ACTIVITY LIMITATIONS: lifting, sleeping, bathing, dressing, reach over head, and hygiene/grooming  PARTICIPATION LIMITATIONS: cleaning, shopping, community activity, and occupation  PERSONAL FACTORS: 1-2 comorbidities: bilateral breast cancer sx, chemo, radiation, lymphedema, shoulder pain are also affecting patient's functional outcome.   REHAB POTENTIAL: Good  CLINICAL DECISION MAKING: Evolving/moderate complexity  EVALUATION COMPLEXITY: Moderate  GOALS: Goals reviewed with patient? Yes  SHORT TERM GOALS: Target date: 11/07/2024  Pt will be independent  in a HEP for Right shoulder ROM Baseline: Goal status: INITIAL  2.  Pts daughter will be independent in compression bandaging to pts right UE Baseline:  Goal status: INITIAL  3.  Pt will be independent with MLD to the right UE Baseline:  Goal status: INITIAL  4.  Pts right shoulder pain will be reduced to no greater than 6/10 Baseline:  Goal status: INITIAL  5.  Pt will have AROM right shoulder to atleast 90 degrees flexion for improved reaching Baseline:  Goal status: INITIAL  6.  Pt will have 1 cm reduction at 15 cm prox to ulna styloid process Baseline:  Goal status: INITIAL  LONG TERM GOALS: Target date:  Pts edema reduction will plateau to allow pt to start in her compression garments Baseline:  Goal status: INITIAL  2.  Pt will receive new compression garments prn, and may consider night garment prn Baseline:  Goal status: INITIAL  3.  Pts right shoulder pain will improve to no greater than 4/10 for improved function Baseline:  Goal status: INITIAL  4.  Pt will have right shoulder flexion atleast 125 degrees for improved reaching ability Baseline:   Goal status: INITIAL  PLAN:  PT FREQUENCY: 2x/week or 12 visits over 8 weeks  PT DURATION: 8 weeks  PLANNED INTERVENTIONS: 97164- PT Re-evaluation, 97110-Therapeutic exercises, 97530- Therapeutic activity, 97112- Neuromuscular re-education, 97535- Self Care, [id number]- Manual therapy, 97760- Orthotic Initial, [phone]- Orthotic/Prosthetic subsequent, and Patient/Family education  PLAN FOR NEXT SESSION: Instruct daughter in compression bandaging - have her demonstrate, AAROM for right shoulder, MLD (inguinal nodes only due to bil) ,CDT, check previous garments when she brings, Assess for bandage comfort and tolerance - GFR is only 27%  Randall Pack, SPT  10/19/2024, 9:32 AM  I agree with the following treatment note after reviewing documentation. This session was performed under the supervision of a licensed clinician.  Saddie Raw, PT 10/19/24, 9:36 AM

## 2024-10-20 ENCOUNTER — Inpatient Hospital Stay: Admission: RE | Admit: 2024-10-20 | Discharge: 2024-10-20 | Attending: Hematology and Oncology

## 2024-10-20 DIAGNOSIS — C50412 Malignant neoplasm of upper-outer quadrant of left female breast: Secondary | ICD-10-CM

## 2024-10-20 MED ORDER — GADOPICLENOL 0.5 MMOL/ML IV SOLN
8.5000 mL | Freq: Once | INTRAVENOUS | Status: AC | PRN
  Administered 2024-10-20: 8.5 mL via INTRAVENOUS

## 2024-10-26 ENCOUNTER — Ambulatory Visit

## 2024-10-26 DIAGNOSIS — G8929 Other chronic pain: Secondary | ICD-10-CM

## 2024-10-26 DIAGNOSIS — I890 Lymphedema, not elsewhere classified: Secondary | ICD-10-CM

## 2024-10-26 DIAGNOSIS — M7989 Other specified soft tissue disorders: Secondary | ICD-10-CM

## 2024-10-26 DIAGNOSIS — C50411 Malignant neoplasm of upper-outer quadrant of right female breast: Secondary | ICD-10-CM

## 2024-10-26 NOTE — Therapy (Signed)
 OUTPATIENT PHYSICAL THERAPY  UPPER EXTREMITY ONCOLOGY EVALUATION  Patient Name: Michele Page MRN: [id number] DOB:11/11/1956, 68 y.o., female Today's Date: 10/26/2024  END OF SESSION:  PT End of Session - 10/26/24 0858     Visit Number 3    Number of Visits 12    Date for Recertification  12/05/24    Authorization Time Period 10/10/24-12/05/2024    Authorization - Visit Number 3    Authorization - Number of Visits 12    Progress Note Due on Visit 10    PT Start Time 0900    PT Stop Time 0954    PT Time Calculation (min) 54 min    Activity Tolerance Patient tolerated treatment well    Behavior During Therapy [facility] for tasks assessed/performed          Past Medical History:  Diagnosis Date   Anemia    past hx many yrs ago    Arthritis    lt knee   Breast cancer ([name])    Both breasts   Breast cancer of 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) 04/02/2016   Chronic kidney disease    CKD   Colon polyps    COPD (chronic obstructive pulmonary disease) ([name])    GERD (gastroesophageal reflux disease)    years ago- not current    Hyperlipidemia    Hypertension    Multiple sclerosis    Personal history of chemotherapy    Personal history of radiation therapy    Stroke ([name])    2013, mild cognitive deficits   Vision abnormalities    Past Surgical History:  Procedure Laterality Date   BREAST BIOPSY Left 03/31/2016   BREAST BIOPSY Right 07/24/2021   BREAST LUMPECTOMY Left 04/17/2016   BREAST LUMPECTOMY Right 09/02/2021   BREAST LUMPECTOMY WITH RADIOACTIVE SEED AND SENTINEL LYMPH NODE BIOPSY Right 09/02/2021   Procedure: RIGHT BREAST LUMPECTOMY WITH RADIOACTIVE SEED AND SENTINEL LYMPH NODE BIOPSY;  Surgeon: Vanderbilt Ned, MD;  Location: [facility];  Service: General;  Laterality: Right;   COLONOSCOPY     HYSTEROSCOPY WITH D & C N/A 12/19/2015   Procedure: DILATATION AND CURETTAGE /HYSTEROSCOPY;  Surgeon: Duwaine Blumenthal, DO;  Location: [facility];  Service:  Gynecology;  Laterality: N/A;   POLYPECTOMY     PORTACATH PLACEMENT Right 10/16/2021   Procedure: INSERTION PORT-A-CATH;  Surgeon: Vanderbilt Ned, MD;  Location: [facility];  Service: General;  Laterality: Right;   RADIOACTIVE SEED GUIDED PARTIAL MASTECTOMY WITH AXILLARY SENTINEL LYMPH NODE BIOPSY Left 04/17/2016   Procedure: RADIOACTIVE SEED GUIDED PARTIAL MASTECTOMY WITH AXILLARY SENTINEL LYMPH NODE BIOPSY;  Surgeon: Ned Vanderbilt, MD;  Location: [facility];  Service: General;  Laterality: Left;  RADIOACTIVE SEED GUIDED PARTIAL MASTECTOMY WITH AXILLARY SENTINEL LYMPH NODE BIOPSY    TUBAL LIGATION     VAGINAL DELIVERY     x3   Patient Active Problem List   Diagnosis Date Noted   Pain in right shoulder 09/19/2024   Positive D dimer 07/25/2024   RLQ abdominal pain 07/25/2024   Leukopenia 07/25/2024   Macrocytic anemia 07/25/2024   Elevated troponin 07/25/2024   Overactive bladder 06/05/2024   Chronic gout of left hand 06/05/2024   Morbid obesity ([name]) 06/05/2024   Port-A-Cath in place 11/06/2021   Malignant neoplasm of upper-outer quadrant of right breast in female, estrogen receptor positive ([name]) 08/04/2021   Neck pain on right side 06/01/2018   History of breast cancer 11/25/2017   Primary insomnia 11/25/2017  Breast cancer of 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) 04/02/2016   Essential hypertension 01/10/2016   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 01/10/2016   Gastroesophageal reflux disease without esophagitis 01/10/2016   Hyperlipidemia 01/10/2016   CKD (chronic kidney disease) stage 4, GFR 15-29 ml/min ([name]) 01/10/2016   Multiple sclerosis 03/20/2015   Accelerated hypertension 03/20/2015   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/2015    PCP:    REFERRING PROVIDER: Mackey Chad MD  REFERRING DIAG: Rigth UE lymphedema  THERAPY DIAG:  Swelling of limb  Lymphedema, not  elsewhere classified  Malignant neoplasm of upper-outer quadrant of right female breast, unspecified estrogen receptor status ([name])  Chronic right shoulder pain  ONSET DATE: 6 months ago, would swell and reduce.  Rationale for Evaluation and Treatment: Rehabilitation  SUBJECTIVE:                                                                                                                                                                                           SUBJECTIVE STATEMENT:  I brought the underwrap back back. I just took the wraps off this am. My hand looks nearly normal but the rest got loose. I found my sleeve too and washed it. My forearm doesn't hurt anymore  PERTINENT HISTORY:  -History of right lumpectomy 09/02/21 with 4/6 nodes positive with chemotherapy and radiation completed, Hx of Lt lumpectomy in 2017 with 4 negative nodes removed and radiation.  Recently started Ibrance  09/28/22. Recent DVT study is negative, does show subcutaneous edema edema on dorsal aspect of right hand and distal forearm.   PAIN:  10/19/24: No pain currently   Eval:  Are you having pain? Yes, pain starts in shoulder and she can't move it like she did NPRS scale:0/10 at rest  5/10 Pain location: Right shoulder Pain orientation: Right  PAIN TYPE: sharp and tight Pain description: intermittent  Aggravating factors: raising arm, doesn't lie on right side, Relieving factors: nothing  PRECAUTIONS: bil lymphedema risk , CVA affecting right UE 2010?  RED FLAGS: Bowel or bladder incontinence: Yes:     WEIGHT BEARING RESTRICTIONS: No  FALLS:  Has patient fallen in last 6 months? 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  COGNITION: Overall cognitive status: Within functional limits for tasks assessed   PALPATION: Mild pitting edema right ulnar border and dorsum of hand  OBSERVATIONS / OTHER ASSESSMENTS:   SENSATION: Light touch: Appears intact   POSTURE: rounded shoulders, forward head   UPPER EXTREMITY AROM/PROM:  A/PROM RIGHT   eval   Shoulder extension   Shoulder flexion 38, P!/PROM supine to approx 70 degrees  Shoulder abduction 38, P!  Shoulder internal rotation   Shoulder external rotation     (Blank rows = not tested)  A/PROM LEFT   eval  Shoulder extension   Shoulder flexion 151  Shoulder abduction 152  Shoulder internal rotation   Shoulder external rotation     (Blank rows = not tested)  CERVICAL AROM: All within normal limits:      UPPER EXTREMITY STRENGTH:   LYMPHEDEMA ASSESSMENTS:   SURGERY TYPE/DATE: right lumpectomy  with SLNB 09/02/21,Lt lumpectomy with SLNB in 2017  NUMBER OF LYMPH NODES REMOVED: 4+/6 right, 0+/4 left  CHEMOTHERAPY: YES  RADIATION:YES  HORMONE TREATMENT: YES  INFECTIONS: NO   LYMPHEDEMA ASSESSMENTS:   LANDMARK RIGHT  eval RIGHT 10/26/2024  At axilla  31.8   15 cm proximal to the proximal aspect of the olecranon process 30.2 30.5  10 cm proximal to the proximal aspect of the olecranon process 29.6 29.6  Olecranon process 27.0 26.5  15 cm proximal to the proximal aspect of the ulnar styloid process 26.3 25.5  10 cm proximal to the proximal aspect of the ulnar styloid process 23.6 21.5  Just distal to the ulnar styloid process 18.2 17  Across hand at thumb web space 22.7 21  At base of 2nd digit 6.9 6.5  (Blank rows = not tested)  LANDMARK LEFT  eval  At axilla  32.2  15 cm proximal to the proximal aspect of the olecranon process 30.2  10 cm proximal to the proximal aspect of the olecranon process  27.4  Olecranon process 24.6  15 cm proximal to the proximal aspect of the ulnar styloid process 24.2  10 cm proximal to  the proximal aspect of the ulnar styloid process 21.0  Just distal to the ulnar styloid process 15.7  Across hand at thumb web space 21.0  At base of 2nd digit 6.4  (Blank rows = not tested)  Chest circumference just inferior to the axillae:  Chest circumference at the largest point:    Lymphedema life impact scale: 57%                                                                                                                            TREATMENT DATE:  Pt permission and consent throughout each step of examination and treatment with modification and draping if requested when working on sensitive areas.  Pt is aware that a clinic chaperone is not available.   10/25/2024 Therapeutic Exercise: Supine chest press with dowel x 10 Supine dowel flexion x  Supine  AAAROM 158 Right shoulder flexion Manual Therapy:  In supine: Short neck, 5 diaphragmatic breaths, R inguinal nodes and establishment of axilloinguinal pathway, then R UE working proximal to distal, moving inner upper arm outwards and upwards, and doing both sides of forearms, spending extra time in any areas of fibrosis then retracing all steps - skipped L axillary nodes and pathway due to bilateral lymph node removal. Pt forgot her compression bandages but brought her sleeve/glove. Advised to bring next time Pt assisted with donning exostrong sleeve/glove. Explained folding in half and getting up over wrist first. Able to don with occasional assist, and able to don glove. Good capillary refill, but advised if she feels hand is too cold or getting numb to remove. Reminded sleve and glove only during the day. Discussed laundering every 2 days. Asked pt to recite instructions back to me due to some memory deficits noted Wrap next visit if measurements still up; asked pt to have her daughter come.     10/19/24 Therapeutic Exercise: Flexion table slides with ball on plinth x 10 Supine chest press with dowel x 10 Supine dowel flexion x 10  Manual Therapy:  In supine: Short neck, 5 diaphragmatic breaths, R inguinal nodes and establishment of axilloinguinal pathway, then R UE working proximal to distal, moving inner upper arm outwards and upwards, and doing both sides of forearms, spending extra time in any areas of fibrosis then retracing all steps - skipped L axillary nodes and pathway due to bilateral lymph node removal Then showed daughter Normand) link on [facility] and we made a video of her own as well.  Lotion, tg soft, fingers 1-4, artiflex hand to axilla, 6cm hand, 8cm and 10cm spiral from wrist to axilla with instruction.  Review of when to remove, watching for SOB, and remedial exercises    Eval: Discussed with pt and her daughter how lymphedema is diagnosed, and what causes swelling after breast surgery with LN removal. Showed poster and how superficial lymph vessels are and why MLD is so gentle. Discussed bandaging as a means to reduce swelling, and then when arm is not changing get fit for compression garments to maintain swelling. Might benefit from a night garment as well and showed them circaid profile. Showed pt AAROM for right shoulder flexion and practiced x 4. Did not remember to give pictures.    PATIENT EDUCATION:  Education details: how lymphedema is diagnosed, and what causes swelling after breast surgery with LN removal. Showed poster and how superficial lymph vessels are and why MLD is so gentle. Discussed bandaging as a means to reduce swelling, and then when arm is not changing get fit for compression garments to maintain swelling. Might benefit from a night garment as well and showed them circaid profile. Showed pt AAROM for right shoulder flexion and practiced x 4. Did not remember to give pictures. Discussed LOS, treatment interventions, and advised to schedule as  soon as possible. She will need to check [employer] schedule before she calls. Also advised pt to find her compression garments from 2023 and bring them in so we can check them. Person educated: Patient and Child(ren) Education method: Explanation Education comprehension: verbalized understanding and needs further education  HOME EXERCISE PROGRAM:    ASSESSMENT:  CLINICAL IMPRESSION: Pt left wrap on for 2 weeks. Advised to remove after 2-3 days or when it gets loose. Good reduction noted, but pt forgot to bring her compression bandages. She did remember her garments so she will wear these  in the daytime (remove if any discomfort/pain/numbness,pain and will return next Tues with her wraps. Will reassess next week but may need a few additional visits to further reduce arm. Asked pt to have her daughter come with her to learn wrapping.  OBJECTIVE IMPAIRMENTS: decreased activity tolerance, decreased knowledge of condition, decreased ROM, decreased strength, increased edema, impaired UE functional use, and pain.   ACTIVITY LIMITATIONS: lifting, sleeping, bathing, dressing, reach over head, and hygiene/grooming  PARTICIPATION LIMITATIONS: cleaning, shopping, community activity, and occupation  PERSONAL FACTORS: 1-2 comorbidities: bilateral breast cancer sx, chemo, radiation, lymphedema, shoulder pain are also affecting patient's functional outcome.   REHAB POTENTIAL: Good  CLINICAL DECISION MAKING: Evolving/moderate complexity  EVALUATION COMPLEXITY: Moderate  GOALS: Goals reviewed with patient? Yes  SHORT TERM GOALS: Target date: 11/07/2024  Pt will be independent in a HEP for Right shoulder ROM Baseline: Goal status: INITIAL  2.  Pts daughter will be independent in compression bandaging to pts right UE Baseline:  Goal status: INITIAL  3.  Pt will be independent with MLD to the right UE Baseline:  Goal status: INITIAL  4.  Pts right shoulder pain will be reduced to no greater  than 6/10 Baseline:  Goal status: INITIAL  5.  Pt will have AROM right shoulder to atleast 90 degrees flexion for improved reaching Baseline:  Goal status: INITIAL  6.  Pt will have 1 cm reduction at 15 cm prox to ulna styloid process Baseline:  Goal status: INITIAL  LONG TERM GOALS: Target date:  Pts edema reduction will plateau to allow pt to start in her compression garments Baseline:  Goal status: INITIAL  2.  Pt will receive new compression garments prn, and may consider night garment prn Baseline:  Goal status: INITIAL  3.  Pts right shoulder pain will improve to no greater than 4/10 for improved function Baseline:  Goal status: INITIAL  4.  Pt will have right shoulder flexion atleast 125 degrees for improved reaching ability Baseline:  Goal status: INITIAL  PLAN:  PT FREQUENCY: 2x/week or 12 visits over 8 weeks  PT DURATION: 8 weeks  PLANNED INTERVENTIONS: 97164- PT Re-evaluation, 97110-Therapeutic exercises, 97530- Therapeutic activity, 97112- Neuromuscular re-education, 97535- Self Care, 02859- Manual therapy, 97760- Orthotic Initial, [phone]- Orthotic/Prosthetic subsequent, and Patient/Family education  PLAN FOR NEXT SESSION: Instruct daughter in compression bandaging - have her demonstrate, AAROM for right shoulder, MLD (inguinal nodes only due to bil) ,CDT, check previous garments when she brings, Assess for bandage comfort and tolerance - GFR is only 27%   Grayce Sheldon, PT 10/26/2024 9:58 AM

## 2024-10-29 ENCOUNTER — Other Ambulatory Visit: Payer: Self-pay

## 2024-10-29 ENCOUNTER — Emergency Department (HOSPITAL_COMMUNITY)
Admission: EM | Admit: 2024-10-29 | Discharge: 2024-10-29 | Disposition: A | Discharge status: Home / Self Care | Source: Home / Self Care | Attending: Emergency Medicine | Admitting: Emergency Medicine

## 2024-10-29 ENCOUNTER — Emergency Department (HOSPITAL_COMMUNITY)

## 2024-10-29 DIAGNOSIS — Z7982 Long term (current) use of aspirin: Secondary | ICD-10-CM | POA: Diagnosis not present

## 2024-10-29 DIAGNOSIS — M545 Low back pain, unspecified: Secondary | ICD-10-CM | POA: Diagnosis not present

## 2024-10-29 DIAGNOSIS — R10A2 Flank pain, left side: Secondary | ICD-10-CM | POA: Diagnosis present

## 2024-10-29 DIAGNOSIS — N189 Chronic kidney disease, unspecified: Secondary | ICD-10-CM

## 2024-10-29 DIAGNOSIS — N12 Tubulo-interstitial nephritis, not specified as acute or chronic: Secondary | ICD-10-CM

## 2024-10-29 LAB — URINALYSIS, ROUTINE W REFLEX MICROSCOPIC
Bilirubin Urine: NEGATIVE
Glucose, UA: NEGATIVE mg/dL
Hgb urine dipstick: NEGATIVE
Ketones, ur: NEGATIVE mg/dL
Nitrite: NEGATIVE
Protein, ur: 100 mg/dL — AB
Specific Gravity, Urine: 1.014 (ref 1.005–1.030)
pH: 5 (ref 5.0–8.0)

## 2024-10-29 LAB — CBC
HCT: 28.7 % — ABNORMAL LOW (ref 36.0–46.0)
Hemoglobin: 9.7 g/dL — ABNORMAL LOW (ref 12.0–15.0)
MCH: 34.6 pg — ABNORMAL HIGH (ref 26.0–34.0)
MCHC: 33.8 g/dL (ref 30.0–36.0)
MCV: 102.5 fL — ABNORMAL HIGH (ref 80.0–100.0)
Platelets: 233 K/uL (ref 150–400)
RBC: 2.8 MIL/uL — ABNORMAL LOW (ref 3.87–5.11)
RDW: 13.3 % (ref 11.5–15.5)
WBC: 2.4 K/uL — ABNORMAL LOW (ref 4.0–10.5)
nRBC: 0 % (ref 0.0–0.2)

## 2024-10-29 LAB — BASIC METABOLIC PANEL WITH GFR
Anion gap: 12 (ref 5–15)
BUN: 35 mg/dL — ABNORMAL HIGH (ref 8–23)
CO2: 20 mmol/L — ABNORMAL LOW (ref 22–32)
Calcium: 9.8 mg/dL (ref 8.9–10.3)
Chloride: 105 mmol/L (ref 98–111)
Creatinine, Ser: 2.73 mg/dL — ABNORMAL HIGH (ref 0.44–1.00)
GFR, Estimated: 18 mL/min — ABNORMAL LOW (ref >60)
Glucose, Bld: 113 mg/dL — ABNORMAL HIGH (ref 70–99)
Potassium: 4.8 mmol/L (ref 3.5–5.1)
Sodium: 136 mmol/L (ref 135–145)

## 2024-10-29 MED ORDER — OXYCODONE-ACETAMINOPHEN 5-325 MG PO TABS: 1.0000 | ORAL_TABLET | Freq: Four times a day (QID) | ORAL | 0 refills | Status: AC | PRN

## 2024-10-29 MED ORDER — CEPHALEXIN 500 MG PO CAPS: 500.0000 mg | ORAL_CAPSULE | Freq: Four times a day (QID) | ORAL | 0 refills | Status: DC

## 2024-10-29 MED ORDER — OXYCODONE-ACETAMINOPHEN 5-325 MG PO TABS
2.0000 | ORAL_TABLET | Freq: Once | ORAL | Status: AC
  Administered 2024-10-29: 2 via ORAL
  Filled 2024-10-29: qty 2

## 2024-10-29 NOTE — ED Provider Notes (Signed)
 [place] EMERGENCY DEPARTMENT AT [facility] Provider Note   CSN: [id number] Arrival date & time: 10/29/24  1138     Patient presents with: Flank Pain   Michele Page is a 68 y.o. female.   68 year old female presents with 1 week history of left-sided flank pain.  Patient states pain has been constant and not associate with dysuria or fever or chills.  No hematuria or dysuria.  Denies any rashes to the area.  States the pain is constant and characterizes sharp and radiates to her left lower quadrant.  She is concerned this may have been some due to her kidneys.  Denies any prior history of renal colic.  Has been seen by her doctor for this but is unsure of what the diagnosis was and was not prescribed any medications.       Prior to Admission medications  Medication Sig Start Date End Date Taking? Authorizing Provider  allopurinol  (ZYLOPRIM ) 100 MG tablet TAKE 1/2 (ONE-HALF) TABLET BY MOUTH EVERY OTHER DAY 09/27/24   Caro Harlene POUR, NP  aspirin  EC 81 MG tablet Take 81 mg by mouth daily. Swallow whole.    [provider]  atorvastatin  (LIPITOR) 20 MG tablet TAKE 1 TABLET EVERY DAY 07/27/24   Eubanks, Jessica K, NP  Blood Pressure Monitoring (OMRON 3 SERIES BP MONITOR) DEVI Use to self-monitor blood pressure as directed. Patient not taking: Reported on 10/17/2024 10/03/24   Alvstad, Kristin L, RPH-CPP  carvedilol  (COREG ) 25 MG tablet Take 1 tablet (25 mg total) by mouth 2 (two) times daily. 08/24/24   Walker, Caitlin S, NP  letrozole  (FEMARA ) 2.5 MG tablet TAKE 1 TABLET EVERY DAY 07/27/24   Gudena, Vinay, MD  NIFEdipine  (PROCARDIA  XL/NIFEDICAL-XL) 90 MG 24 hr tablet Take 1 tablet (90 mg total) by mouth daily. 08/24/24   Vannie Reche RAMAN, NP  oxybutynin  (DITROPAN ) 5 MG tablet Take 10 mg by mouth daily. 05/29/24   [provider]  palbociclib  (IBRANCE ) 75 MG tablet Take 1 tablet (75 mg total) by mouth daily. Take for 21 days on, 7 days off, repeat every 28  days. 09/20/24   Gudena, Vinay, MD  spironolactone  (ALDACTONE ) 25 MG tablet Take 1 tablet (25 mg total) by mouth daily. 10/03/24 01/01/25  Court Dorn PARAS, MD  traMADol  (ULTRAM ) 50 MG tablet Take 1 tablet (50 mg total) by mouth every 12 (twelve) hours as needed. 09/19/24   Persons, Ronal Dragon, PA  zolpidem  (AMBIEN ) 5 MG tablet Take 1 tablet (5 mg total) by mouth at bedtime as needed for sleep. 09/04/24   Eubanks, Jessica K, NP    Allergies: Patient has no known allergies.    Review of Systems  All other systems reviewed and are negative.   Updated Vital Signs BP (!) 146/92 (BP Location: Right Arm)   Pulse 71   Temp 98 F (36.7 C) (Oral)   Resp 16   SpO2 98%   Physical Exam Vitals and nursing note reviewed.  Constitutional:      General: She is not in acute distress.    Appearance: Normal appearance. She is well-developed. She is not toxic-appearing.  HENT:     Head: Normocephalic and atraumatic.  Eyes:     General: Lids are normal.     Conjunctiva/sclera: Conjunctivae normal.     Pupils: Pupils are equal, round, and reactive to light.  Neck:     Thyroid : No thyroid  mass.     Trachea: No tracheal deviation.  Cardiovascular:  Rate and Rhythm: Normal rate and regular rhythm.     Heart sounds: Normal heart sounds. No murmur heard.    No gallop.  Pulmonary:     Effort: Pulmonary effort is normal. No respiratory distress.     Breath sounds: Normal breath sounds. No stridor. No decreased breath sounds, wheezing, rhonchi or rales.  Abdominal:     General: There is no distension.     Palpations: Abdomen is soft.     Tenderness: There is no abdominal tenderness. There is no rebound.  Musculoskeletal:        General: No tenderness. Normal range of motion.     Cervical back: Normal range of motion and neck supple.       Back:  Skin:    General: Skin is warm and dry.     Findings: No abrasion or rash.  Neurological:     Mental Status: She is alert and oriented to person,  place, and time. Mental status is at baseline.     GCS: GCS eye subscore is 4. GCS verbal subscore is 5. GCS motor subscore is 6.     Cranial Nerves: No cranial nerve deficit.     Sensory: No sensory deficit.     Motor: Motor function is intact.  Psychiatric:        Attention and Perception: Attention normal.        Speech: Speech normal.        Behavior: Behavior normal.     (all labs ordered are listed, but only abnormal results are displayed) Labs Reviewed  BASIC METABOLIC PANEL WITH GFR - Abnormal; Notable for the following components:      Result Value   CO2 20 (*)    Glucose, Bld 113 (*)    BUN 35 (*)    Creatinine, Ser 2.73 (*)    GFR, Estimated 18 (*)    All other components within normal limits  CBC - Abnormal; Notable for the following components:   WBC 2.4 (*)    RBC 2.80 (*)    Hemoglobin 9.7 (*)    HCT 28.7 (*)    MCV 102.5 (*)    MCH 34.6 (*)    All other components within normal limits  URINALYSIS, ROUTINE W REFLEX MICROSCOPIC    EKG: None  Radiology: No results found.   Procedures   Medications Ordered in the ED  oxyCODONE -acetaminophen  (PERCOCET/ROXICET) 5-325 MG per tablet 2 tablet (has no administration in time range)                                    Medical Decision Making Amount and/or Complexity of Data Reviewed Labs: ordered. Radiology: ordered.  Risk Prescription drug management.  Patient will chronic kidney disease here.  Urinalysis does show infection.  Renal CT shows no evidence of obstruction.  Will place on antibiotics and discharge      Final diagnoses:  None    ED Discharge Orders     None          Dasie Faden, MD 10/29/24 1719

## 2024-10-29 NOTE — Progress Notes (Unsigned)
 [facility]  PATIENT: Michele Page DOB: 11-17-1955  REFERRING DOCTOR OR PCP:  Jereld Serum, NP; Harlene An, NP SOURCE: patient,   _________________________________   HISTORICAL  CHIEF COMPLAINT:  Chief Complaint  Patient presents with   New Patient (Initial Visit)    Rm11, daughter present, internal referral for Dizziness, Atherosclerotic vascular disease: orthostatic bp completed. Mmse 17    HISTORY OF PRESENT ILLNESS:  I had the pleasure of seeing this patient, Michele Page, at [facility] for neurologic consultation regarding her dizziness and memory issues.  She is a 67 year old woman with history of breast cancer, history of hemorrhagic left thalamic stroke, chronic kidney disease who has had several bouts of dizziness recently..    She notes that upon standing she feels lightheaded.   This has happened multiple times over the past week.  She has not passed out or fallen.   Two days ago she went to the ED with left flank pain and was diagnosed with a UTI. An ABx was prescribed but not yet started.  She also notes eating and drinking less for the past 2 days because of the flank pain  Her daughter in law notes that [name] has felt more tired and has had some cognitive changes recently   She has had some confusion since the stroke but DIL notes she has had more issues the past few months.  She is forgetful and repeats herself.  This is even a little worse with current UTI.  She has had mild right sided weakness since a hemorrhagic infarction in hte left thalamus.    The daughter-in-law also notes that there has been some cognitive issues since then.  In 2023, her right breast cancer was staged:  Stage IB (pT2, pN2(sn), cM0, G2, ER+, PR+, HER2-, Oncotype DX score: 20). She is on letrozole  and Ibrance .   She had been on IV chemotherapy in 2023 and had breast radiation.    Additionally she had left breast cancer in 2017 (stage Ia  (T1c, N0, cM0)  I showed the MRI images with very extensive white matter changes in the cerebral hemispheres, likely due to severe chronic microvascular ischemic changes.  I also showed the remnants of the hemorrhagic thalamic stroke  Vascular risks:  Has HTN.   Used to smoke 1 ppd,  none x 15 years.  Does not have Dm     10/31/2024    3:38 PM 12/11/2016    1:33 PM  MMSE - Mini Mental State Exam  Orientation to time 3 5   Orientation to Place 4 4   Registration 3 3   Attention/ Calculation 0 5   Recall 0 2   Language- name 2 objects 2 2   Language- repeat 1 1  Language- follow 3 step command 2 2   Language- read & follow direction 1 1   Write a sentence 1 1   Copy design 0 0   Total score 17 26      Data saved with a previous flowsheet row definition     She works in maintenance.   She went to college but did not complete.   Imaging: CT scan of the head 06/21/2024 showed a remote stroke in the left thalamus and basal ganglia.  Extensive T1 hypointense change consistent with chronic microvascular ischemic change  MRI of the head 11/24/2022 showed a remote hemorrhagic infarction involving the left thalamus, internal capsule and basal ganglia.  There was extensive T2/FLAIR confluent hyperintensity  consistent with severe chronic microvascular ischemic change.  MRI of the lumbar spine 07/21/2024 showed vertebral body abnormalities with enhancement worrisome for metastatic disease.  There is a superior endplate fracture at L1, stable compared to November.  No epidural or extraosseous tumor extension.  REVIEW OF SYSTEMS: Constitutional: No fevers, chills, sweats, or change in appetite Eyes: No visual changes, double vision, eye pain Ear, nose and throat: No hearing loss, ear pain, nasal congestion, sore throat Cardiovascular: No chest pain, palpitations Respiratory:  No shortness of breath at rest or with exertion.   No wheezes GastrointestinaI: No nausea, vomiting, diarrhea, abdominal  pain, fecal incontinence Genitourinary:  No dysuria, urinary retention or frequency.  No nocturia. Musculoskeletal:  No neck pain, back pain Integumentary: No rash, pruritus, skin lesions Neurological: as above Psychiatric: No depression at this time.  No anxiety Endocrine: No palpitations, diaphoresis, change in appetite, change in weigh or increased thirst Hematologic/Lymphatic:  No anemia, purpura, petechiae. Allergic/Immunologic: No itchy/runny eyes, nasal congestion, recent allergic reactions, rashes  ALLERGIES: Allergies[1]  HOME MEDICATIONS: Current Medications[2]  PAST MEDICAL HISTORY: Past Medical History:  Diagnosis Date   Anemia    past hx many yrs ago    Arthritis    lt knee   Breast cancer ([name])    Both breasts   Breast cancer of 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) 04/02/2016   Chronic kidney disease    CKD   Colon polyps    COPD (chronic obstructive pulmonary disease) ([name])    GERD (gastroesophageal reflux disease)    years ago- not current    Hyperlipidemia    Hypertension    Multiple sclerosis    Personal history of chemotherapy    Personal history of radiation therapy    Stroke ([name])    2013, mild cognitive deficits   Vision abnormalities     PAST SURGICAL HISTORY: Past Surgical History:  Procedure Laterality Date   BREAST BIOPSY Left 03/31/2016   BREAST BIOPSY Right 07/24/2021   BREAST LUMPECTOMY Left 04/17/2016   BREAST LUMPECTOMY Right 09/02/2021   BREAST LUMPECTOMY WITH RADIOACTIVE SEED AND SENTINEL LYMPH NODE BIOPSY Right 09/02/2021   Procedure: RIGHT BREAST LUMPECTOMY WITH RADIOACTIVE SEED AND SENTINEL LYMPH NODE BIOPSY;  Surgeon: Vanderbilt Ned, MD;  Location: [facility];  Service: General;  Laterality: Right;   COLONOSCOPY     HYSTEROSCOPY WITH D & C N/A 12/19/2015   Procedure: DILATATION AND CURETTAGE /HYSTEROSCOPY;  Surgeon: Duwaine Blumenthal, DO;  Location: [facility];  Service: Gynecology;  Laterality: N/A;    POLYPECTOMY     PORTACATH PLACEMENT Right 10/16/2021   Procedure: INSERTION PORT-A-CATH;  Surgeon: Vanderbilt Ned, MD;  Location: [facility];  Service: General;  Laterality: Right;   RADIOACTIVE SEED GUIDED PARTIAL MASTECTOMY WITH AXILLARY SENTINEL LYMPH NODE BIOPSY Left 04/17/2016   Procedure: RADIOACTIVE SEED GUIDED PARTIAL MASTECTOMY WITH AXILLARY SENTINEL LYMPH NODE BIOPSY;  Surgeon: Ned Vanderbilt, MD;  Location: [facility];  Service: General;  Laterality: Left;  RADIOACTIVE SEED GUIDED PARTIAL MASTECTOMY WITH AXILLARY SENTINEL LYMPH NODE BIOPSY    TUBAL LIGATION     VAGINAL DELIVERY     x3    FAMILY HISTORY: Family History  Problem Relation Age of Onset   Hypertension Mother    Stroke Mother    Stroke Sister    Alcohol abuse Brother    HIV/AIDS Brother    Breast cancer Cousin        dx 80s   Colon cancer Neg Hx  Colon polyps Neg Hx    Esophageal cancer Neg Hx    Rectal cancer Neg Hx    Stomach cancer Neg Hx     SOCIAL HISTORY: Social History   Socioeconomic History   Marital status: Married    Spouse name: Not on file   Number of children: 3   Years of education: Not on file   Highest education level: Some college, no degree  Occupational History   Occupation: retired  Tobacco Use   Smoking status: Former    Current packs/day: 0.00    Average packs/day: 0.5 packs/day for 25.0 years (12.5 ttl pk-yrs)    Types: Cigarettes    Start date: 11/17/1979    Quit date: 11/16/2004    Years since quitting: 19.9   Smokeless tobacco: Never  Vaping Use   Vaping status: Never Used  Substance and Sexual Activity   Alcohol use: Yes    Comment: Occassionally.   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    Exercise- Yes   Living will-No   DNR-N/A   POA/HPOA-No      Works part-time at [employer]   Social Drivers of Health   Tobacco Use: Medium Risk (10/31/2024)   Patient History    Smoking Tobacco Use: Former    Smokeless Tobacco Use: Never    Passive Exposure: Not on Actuary Strain: Low Risk (08/31/2024)   Overall Financial Resource Strain (CARDIA)    Difficulty of Paying Living Expenses: Not very hard  Food Insecurity: No Food Insecurity (08/31/2024)   Epic    Worried About Programme Researcher, Broadcasting/film/video in the Last Year: Never true    Ran Out of Food in the Last Year: Never true  Transportation Needs: Unknown (08/31/2024)   Epic    Lack of Transportation (Medical): No    Lack of Transportation (Non-Medical): Not on file  Physical Activity: Insufficiently Active (08/31/2024)   Exercise Vital Sign    Days of Exercise per Week: 2 days    Minutes of Exercise per Session: 20 min  Stress: No Stress Concern Present (08/31/2024)   [employer] of Occupational Health - Occupational Stress Questionnaire    Feeling of Stress: Not at all  Social Connections: Moderately Integrated (08/31/2024)   Social Connection and Isolation Panel    Frequency of Communication with Friends and Family: Once a week    Frequency of Social Gatherings with Friends and Family: Once a week    Attends Religious Services: More than 4 times per year    Active Member of Clubs or Organizations: Yes    Attends Banker Meetings: 1 to 4 times per year    Marital Status: Married  Catering Manager Violence: Not At Risk (07/25/2024)   Epic    Fear of Current or Ex-Partner: No    Emotionally Abused: No    Physically Abused: No    Sexually Abused: No  Depression (PHQ2-9): Low Risk (08/22/2024)   Depression (PHQ2-9)    PHQ-2 Score: 0  Alcohol Screen: Low Risk (08/31/2024)   Alcohol Screen    Last Alcohol Screening Score (AUDIT): 1  Housing: Patient Declined (08/31/2024)   Epic  Unable to  Pay for Housing in the Last Year: Patient declined    Number of Times Moved in the Last Year: Not on file    Homeless in the Last Year: Patient declined  Utilities: Not At Risk (07/25/2024)   Epic    Threatened with loss of utilities: No  Health Literacy: Adequate Health Literacy (08/24/2024)   B1300 Health Literacy    Frequency of need for help with medical instructions: Never       PHYSICAL EXAM  Vitals:   10/31/24 1431 10/31/24 1433 10/31/24 1435  BP: 137/83 115/74 106/68  Pulse: 68 80 83  SpO2: 95% 98% 96%  Height: 5' 3 (1.6 m)      Body mass index is 33.41 kg/m.   General: The patient is well-developed and well-nourished and in no acute distress  HEENT:  Head is Evans/AT.  Sclera are anicteric.  Funduscopic exam shows normal optic discs and retinal vessels.  Neck: No carotid bruits are noted.  The neck is nontender.  Cardiovascular: The heart has a regular rate and rhythm with a normal S1 and S2. There were no murmurs, gallops or rubs.    Skin: Extremities are without rash or  edema.  Musculoskeletal:  Back is nontender  Neurologic Exam  Mental status: The patient is alert and oriented x 2 at the time of the examination. The patient has short-term memory and focus/attention.   Speech is normal.  Cranial nerves: Extraocular movements are full. Pupils are equal, round, and reactive to light and accomodation.  Visual fields are full.  Facial symmetry is present. There is good facial sensation to soft touch bilaterally.Facial strength is normal.  Trapezius and sternocleidomastoid strength is normal. No dysarthria is noted.  The tongue is midline, and the patient has symmetric elevation of the soft palate. No obvious hearing deficits are noted.  Motor:  Muscle bulk is normal.   Tone is normal. Strength is  5 / 5 in all 4 extremities except 4+/5 triceps, iliopsoas and toe extensors on the right.   Sensory: Sensory testing is intact to pinprick, soft touch and vibration  sensation in all 4 extremities.  Coordination: Cerebellar testing reveals good finger-nose-finger and heel-to-shin bilaterally.  Gait and station: Station is normal.   Gait has a reduced stride.  Tandem gait is poor Romberg is negative.   Reflexes: Deep tendon reflexes are symmetric and normal bilaterally.   Plantar responses are flexor.    DIAGNOSTIC DATA (LABS, IMAGING, TESTING) - I reviewed patient records, labs, notes, testing and imaging myself where available.  Lab Results  Component Value Date   WBC 2.4 (L) 10/29/2024   HGB 9.7 (L) 10/29/2024   HCT 28.7 (L) 10/29/2024   MCV 102.5 (H) 10/29/2024   PLT 233 10/29/2024      Component Value Date/Time   NA 136 10/29/2024 1257   NA 140 10/10/2024 1333   NA 141 04/08/2016 0904   K 4.8 10/29/2024 1257   K 4.7 04/08/2016 0904   CL 105 10/29/2024 1257   CO2 20 (L) 10/29/2024 1257   CO2 26 04/08/2016 0904   GLUCOSE 113 (H) 10/29/2024 1257   GLUCOSE 124 04/08/2016 0904   BUN 35 (H) 10/29/2024 1257   BUN 20 10/10/2024 1333   BUN 16.3 04/08/2016 0904   CREATININE 2.73 (H) 10/29/2024 1257   CREATININE 1.80 (H) 10/05/2024 1030   CREATININE 1.60 (H) 06/14/2024 1345   CREATININE 1.5 (H) 04/08/2016 0904   CALCIUM  9.8 10/29/2024 1257   CALCIUM   10.0 04/08/2016 0904   PROT 7.2 10/05/2024 1030   PROT 7.6 04/08/2016 0904   ALBUMIN  4.7 10/05/2024 1030   ALBUMIN  4.3 04/08/2016 0904   AST 29 10/05/2024 1030   AST 25 04/08/2016 0904   ALT 13 10/05/2024 1030   ALT 23 04/08/2016 0904   ALKPHOS 125 10/05/2024 1030   ALKPHOS 90 04/08/2016 0904   BILITOT 0.3 10/05/2024 1030   BILITOT 0.58 04/08/2016 0904   GFRNONAA 18 (L) 10/29/2024 1257   GFRNONAA 30 (L) 10/05/2024 1030   GFRNONAA 33 (L) 04/18/2021 1348   GFRAA 38 (L) 04/18/2021 1348   Lab Results  Component Value Date   CHOL 146 09/04/2024   HDL 66 09/04/2024   LDLCALC 62 09/04/2024   TRIG 95 09/04/2024   CHOLHDL 2.2 09/04/2024   Lab Results  Component Value Date   HGBA1C  5.8 (H) 05/13/2020   Lab Results  Component Value Date   VITAMINB12 488 11/29/2023   Lab Results  Component Value Date   TSH 0.506 08/24/2024       ASSESSMENT AND PLAN  Mild cognitive impairment - Plan: Vitamin B12, TSH, ATN PROFILE  Orthostatic hypotension  Essential hypertension  Hemiplegia following CVA (cerebrovascular accident) ([name])   Her progressive cognitive issues most likely due to vascular dementia from sequela of her stroke combined with the very extensive microvascular change.  Donepezil might have some benefit but she prefers not to add additional medication.  We will check B12, TSH and Alzheimer biomarkers.  If the Alzheimer biomarkers show a reduced amyloid beta 42/40 ratio then I would recommend that she reconsider donepezil treatment.  She is not a candidate for antiamyloid therapy The orthostatic hypotension appears to be more recent and likely related to her ongoing infection/possible dehydration.  If this worsens despite treatment of the UTI, consider fludrocortisone or pyridostigmine treatment No follow-up visit is needed at this time.  They should call for significant new or worsening neurologic symptoms.  Cris Talavera A. Alfie Alderfer, MD, [facility] 10/31/2024, 5:38 PM Certified in Neurology, Clinical Neurophysiology, Sleep Medicine and Neuroimaging  [facility] [address] [place], [place] [address] [phone]     [1] No Known Allergies [2]  Current Outpatient Medications:    allopurinol  (ZYLOPRIM ) 100 MG tablet, TAKE 1/2 (ONE-HALF) TABLET BY MOUTH EVERY OTHER DAY, Disp: 45 tablet, Rfl: 0   aspirin  EC 81 MG tablet, Take 81 mg by mouth daily. Swallow whole., Disp: , Rfl:    atorvastatin  (LIPITOR) 20 MG tablet, TAKE 1 TABLET EVERY DAY, Disp: 90 tablet, Rfl: 3   carvedilol  (COREG ) 25 MG tablet, Take 1 tablet (25 mg total) by mouth 2 (two) times daily., Disp: 180 tablet, Rfl: 3   cephALEXin  (KEFLEX ) 500 MG capsule, Take 1 capsule  (500 mg total) by mouth 4 (four) times daily., Disp: 20 capsule, Rfl: 0   letrozole  (FEMARA ) 2.5 MG tablet, TAKE 1 TABLET EVERY DAY, Disp: 90 tablet, Rfl: 3   NIFEdipine  (PROCARDIA  XL/NIFEDICAL-XL) 90 MG 24 hr tablet, Take 1 tablet (90 mg total) by mouth daily., Disp: 90 tablet, Rfl: 1   oxybutynin  (DITROPAN ) 5 MG tablet, Take 10 mg by mouth daily., Disp: , Rfl:    oxyCODONE -acetaminophen  (PERCOCET/ROXICET) 5-325 MG tablet, Take 1 tablet by mouth every 6 (six) hours as needed for severe pain (pain score 7-10)., Disp: 15 tablet, Rfl: 0   palbociclib  (IBRANCE ) 75 MG tablet, Take 1 tablet (75 mg total) by mouth daily. Take for 21 days on, 7 days off, repeat  every 28 days., Disp: 21 tablet, Rfl: 3   spironolactone  (ALDACTONE ) 25 MG tablet, Take 1 tablet (25 mg total) by mouth daily., Disp: 90 tablet, Rfl: 3

## 2024-10-29 NOTE — ED Triage Notes (Signed)
 PT arrives to triage accompanied by husband. Pt has complaints of LEFT flank pain and nausea that has been persistent, but states the pain fell. Pt endorses vomiting. Endorses less urine output.

## 2024-10-31 ENCOUNTER — Ambulatory Visit: Admitting: Neurology

## 2024-10-31 ENCOUNTER — Encounter: Payer: Self-pay | Admitting: Neurology

## 2024-10-31 ENCOUNTER — Ambulatory Visit

## 2024-10-31 VITALS — BP 106/68 | HR 83 | Ht 63.0 in

## 2024-10-31 DIAGNOSIS — I69359 Hemiplegia and hemiparesis following cerebral infarction affecting unspecified side: Secondary | ICD-10-CM

## 2024-10-31 DIAGNOSIS — I951 Orthostatic hypotension: Secondary | ICD-10-CM

## 2024-10-31 DIAGNOSIS — I10 Essential (primary) hypertension: Secondary | ICD-10-CM

## 2024-10-31 DIAGNOSIS — G3184 Mild cognitive impairment, so stated: Secondary | ICD-10-CM

## 2024-11-02 ENCOUNTER — Inpatient Hospital Stay: Admitting: Hematology and Oncology

## 2024-11-02 ENCOUNTER — Inpatient Hospital Stay

## 2024-11-02 ENCOUNTER — Encounter (HOSPITAL_BASED_OUTPATIENT_CLINIC_OR_DEPARTMENT_OTHER): Admitting: Family

## 2024-11-02 VITALS — BP 141/59 | HR 68 | Temp 98.0°F | Resp 18 | Ht 63.0 in | Wt 179.5 lb

## 2024-11-02 DIAGNOSIS — C50412 Malignant neoplasm of upper-outer quadrant of left female breast: Secondary | ICD-10-CM | POA: Diagnosis not present

## 2024-11-02 DIAGNOSIS — Z170 Estrogen receptor positive status [ER+]: Secondary | ICD-10-CM

## 2024-11-02 DIAGNOSIS — C50411 Malignant neoplasm of upper-outer quadrant of right female breast: Secondary | ICD-10-CM | POA: Diagnosis not present

## 2024-11-02 NOTE — Assessment & Plan Note (Signed)
 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 systemic chemotherapy with CMF x6 cycles completed 01/30/2022 2. adjuvant radiation therapy completing 04/09/2022 3.  For the continued antiestrogen therapy (previously anastrozole  started 07/23/2016 switched to letrozole  2018 switched to tamoxifen  2018, switched back to anastrozole ) hair loss was the main problem, may consider exemestane --------------------------------------------------------------------------------------   Current treatment: Letrozole  with Ibrance .  Patient could not tolerate Verzinio Toxicities: Fatigue Leukopenia: ANC 1.9   Signatera:  06/12/2022: Positive (0.52) 09/02/2022: Positive (68.11) Mar 22, 2023: Positive (106.54) 07/06/2023: Positive (662) 09/13/2023: Positive (57.14) 03/07/2024: 109.06 09/24/2024: 29.94   CT CAP 07/10/2022: No metastatic disease.  Tiny pulmonary nodules nonspecific. CT CAP 04/02/2023: New peripherally sclerotic lesion superior endplate L2 suspicious for treated metastasis or Schmorl's node.  No other sites of metastatic disease identified CT CAP 07/06/2023: Subpectoral lymph node 7 mm (used to be 5 to 6 mm) ill-defined groundglass density right lower lobe bone metastases stable T11 L2 CT CAP 09/24/2023: Stable bone metastasis T11 L2 MRI neck: 10/26/2023: Left thyroid  nodule 2.5 cm unchanged, no findings in the  neck 10/27/2023: Left leg x-rays: Mild osteoarthritis 12/15/2023: CT abdomen: Stable T11 and L2 03/23/24: CT CAP: Stable T 11 and L2 10/11/2024: CT CAP: 3 mm right upper lobe lung nodule new, focal lucency L1 stable T11 and T12 and bilateral iliac bones   Elevated creatinine: Follows with nephrology   Discussion: Recommended participation in preempt clinical trial (asymptomatic bone metastases: Radiation therapy) Xgeva every 3 months MRI lumbar spine 10/20/2024: Widespread enhancing bone metastatic disease.  Mild L1 pathologic compression fracture stable

## 2024-11-02 NOTE — Progress Notes (Signed)
 Patient Care Team: Caro Harlene POUR, NP as PCP - General (Geriatric Medicine) Court Dorn PARAS, MD as PCP - Cardiology (Cardiology) Odean Potts, MD as Consulting Physician (Oncology) Vanderbilt Ned, MD as Consulting Physician (General Surgery) Lucila Norleen LABOR, RPH-CPP as Pharmacist (Hematology and Oncology) Albertus, Gordy HERO, MD as Consulting Physician (Gastroenterology)  DIAGNOSIS:  Encounter Diagnosis  Name Primary?   Malignant neoplasm of upper-outer quadrant of left breast in female, estrogen receptor positive ([name]) Yes    SUMMARY OF ONCOLOGIC HISTORY: Oncology History  Breast cancer of 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)  03/31/2016 Initial Diagnosis   Screening detected left breast mass, 2 nodules, 1.9 x 1.6 x 0.8 cm= fat necrosis; 8 x 7 x 7 mm= grade 2 IDC ER 100%, PR 100%, HER-2 negative ratio 1.39, Ki-67 20%   04/17/2016 Surgery   Left lumpectomy ([name]): IDC grade 2, 1.3 cm, with DCIS, margins negative, 0/4 lymph nodes negative, T1 cN0 stage IA pathologic stage, ER 100%, PR 100%, HER-2 negative ratio 1.39, Ki-67 20% Oncotype DX score 20, 13% ROR, intermediate risk   04/17/2016 Oncotype testing   Recurrence score: 20; ROR 15% (intermediate risk)    05/27/2016 - 07/13/2016 Radiation Therapy   Adjuvant radiation therapy [facility]): Left breast treated with breath hold to 50.4 Gy in 28 fractions at 1.8 Gy/fraction.  Left breast boosted to 10 Gy in 5 fractions at 2 Gy/fraction   07/23/2016 -  Anti-estrogen oral therapy   Anastrozole  1 mg switched to letrozole  04/22/2017 due to hair loss, switch to tamoxifen  10/22/2017 due to hair loss from letrozole  as well   Malignant neoplasm of upper-outer quadrant of right breast in female, estrogen receptor positive ([name])  08/04/2021 Initial Diagnosis   Malignant neoplasm of upper-outer quadrant of right breast in female, estrogen receptor positive ([name])    Relapse/Recurrence   2.2 cm ILC ER 100%, PR 90%, Her 2 Neg, Ki 67: 25%,  Size 2.5 cm   08/04/2021 Cancer Staging   Staging form: Breast, AJCC 8th Edition - Clinical: Stage IB (cT2, cN0, cM0, G2, ER+, PR+, HER2-) - Signed by Odean Potts, MD on 08/04/2021 Stage prefix: Initial diagnosis Histologic grading system: 3 grade system   09/02/2021 Surgery   Right lumpectomy: Grade 2 ILC 3.1 cm, margins negative, LVI present, 4/6 lymph nodes macrometastases, 2 lymph nodes isolated tumor cells, ER 100%, PR 90%, Ki-67 25%, HER2 negative   10/17/2021 - 01/30/2022 Chemotherapy   Patient is on Treatment Plan : BREAST Adjuvant CMF IV q21d      Genetic Testing   Negative genetic testing. No pathogenic variants identified on the [employer] CustomNext+RNA panel. The report date is 03/04/2022.  The CustomNext-Cancer+RNAinsight panel offered by [employer] includes sequencing and rearrangement analysis for the following 47 genes:  APC, ATM, AXIN2, BARD1, BMPR1A, BRCA1, BRCA2, BRIP1, CDH1, CDK4, CDKN2A, CHEK2, DICER1, EPCAM, GREM1, HOXB13, MEN1, MLH1, MSH2, MSH3, MSH6, MUTYH, NBN, NF1, NF2, NTHL1, PALB2, PMS2, POLD1, POLE, PTEN, RAD51C, RAD51D, RECQL, RET, SDHA, SDHAF2, SDHB, SDHC, SDHD, SMAD4, SMARCA4, STK11, TP53, TSC1, TSC2, and VHL.  RNA data is routinely analyzed for use in variant interpretation for all genes.     CHIEF COMPLIANT: Follow-up to review the spine MRI  HISTORY OF PRESENT ILLNESS: Michele Page is a 87 with metastatic breast cancer is currently on treatment with Ibrance  and letrozole .  She underwent a spine MRI and is here today to discuss the results accompanied by her family.  Her family reports that she has become more confused and  forgetful.  They had gone to the emergency room with abdominal pain and was given a CT scan which did not show any evidence of kidney stones.    ALLERGIES:  has no known allergies.  MEDICATIONS:  Current Outpatient Medications  Medication Sig Dispense Refill   allopurinol  (ZYLOPRIM ) 100 MG tablet TAKE 1/2 (ONE-HALF) TABLET BY MOUTH  EVERY OTHER DAY 45 tablet 0   aspirin  EC 81 MG tablet Take 81 mg by mouth daily. Swallow whole.     atorvastatin  (LIPITOR) 20 MG tablet TAKE 1 TABLET EVERY DAY 90 tablet 3   carvedilol  (COREG ) 25 MG tablet Take 1 tablet (25 mg total) by mouth 2 (two) times daily. 180 tablet 3   cephALEXin  (KEFLEX ) 500 MG capsule Take 1 capsule (500 mg total) by mouth 4 (four) times daily. 20 capsule 0   letrozole  (FEMARA ) 2.5 MG tablet TAKE 1 TABLET EVERY DAY 90 tablet 3   NIFEdipine  (PROCARDIA  XL/NIFEDICAL-XL) 90 MG 24 hr tablet Take 1 tablet (90 mg total) by mouth daily. 90 tablet 1   oxybutynin  (DITROPAN ) 5 MG tablet Take 10 mg by mouth daily.     oxyCODONE -acetaminophen  (PERCOCET/ROXICET) 5-325 MG tablet Take 1 tablet by mouth every 6 (six) hours as needed for severe pain (pain score 7-10). 15 tablet 0   palbociclib  (IBRANCE ) 75 MG tablet Take 1 tablet (75 mg total) by mouth daily. Take for 21 days on, 7 days off, repeat every 28 days. 21 tablet 3   spironolactone  (ALDACTONE ) 25 MG tablet Take 1 tablet (25 mg total) by mouth daily. 90 tablet 3   No current facility-administered medications for this visit.    PHYSICAL EXAMINATION: ECOG PERFORMANCE STATUS: 1 - Symptomatic but completely ambulatory  Vitals:   11/02/24 1435  BP: (!) 141/59  Pulse: 68  Resp: 18  Temp: 98 F (36.7 C)  SpO2: 100%   Filed Weights   11/02/24 1435  Weight: 179 lb 8 oz (81.4 kg)      LABORATORY DATA:  I have reviewed the data as listed    Latest Ref Rng & Units 10/29/2024   12:57 PM 10/10/2024    1:33 PM 10/05/2024   10:30 AM  CMP  Glucose 70 - 99 mg/dL 886  96  875   BUN 8 - 23 mg/dL 35  20  21   Creatinine 0.44 - 1.00 mg/dL 7.26  7.99  8.19   Sodium 135 - 145 mmol/L 136  140  141   Potassium 3.5 - 5.1 mmol/L 4.8  4.7  3.8   Chloride 98 - 111 mmol/L 105  104  104   CO2 22 - 32 mmol/L 20  20  25    Calcium  8.9 - 10.3 mg/dL 9.8  9.8  9.7   Total Protein 6.5 - 8.1 g/dL   7.2   Total Bilirubin 0.0 - 1.2 mg/dL    0.3   Alkaline Phos 38 - 126 U/L   125   AST 15 - 41 U/L   29   ALT 0 - 44 U/L   13     Lab Results  Component Value Date   WBC 2.4 (L) 10/29/2024   HGB 9.7 (L) 10/29/2024   HCT 28.7 (L) 10/29/2024   MCV 102.5 (H) 10/29/2024   PLT 233 10/29/2024   NEUTROABS 1.7 10/05/2024    ASSESSMENT & PLAN:  Breast cancer of 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) 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, 1.3 cm, with DCIS, margins negative, 0/4  lymph nodes negative, T1 cN0 stage IA pathologic stage, ER 100%, PR 100%, HER-2 negative ratio 1.39, Ki-67 20% (Originally 2 nodules were detected on screening mammogram 1.9 cm= fat necrosis and 8 mm IDC) Oncotype DX score 20, 13% risk of recurrence, intermediate risk Adjuvant radiation therapy started 07/20/2017completed 07/13/2016     09/02/2021:Right lumpectomy: Grade 2 ILC 3.1 cm, margins negative, LVI present, 4/6 lymph nodes macrometastases, 2 lymph nodes isolated tumor cells, ER 100%, PR 90%, Ki-67 25%, HER2 negative   Treatment plan/summary: 1. systemic chemotherapy with CMF x6 cycles completed 01/30/2022 2. adjuvant radiation therapy completing 04/09/2022 3.  For the continued antiestrogen therapy (previously anastrozole  started 07/23/2016 switched to letrozole  2018 switched to tamoxifen  2018, switched back to anastrozole ) hair loss was the main problem, may consider exemestane --------------------------------------------------------------------------------------   Current treatment: Letrozole  with Ibrance .  Patient could not tolerate Verzinio Toxicities: Fatigue Leukopenia: ANC 1.9   Signatera:  06/12/2022: Positive (0.52) 09/02/2022: Positive (68.11) Mar 22, 2023: Positive (106.54) 07/06/2023: Positive (662) 09/13/2023: Positive (57.14) 03/07/2024: 109.06 09/24/2024: 29.94   CT CAP 07/10/2022: No metastatic disease.  Tiny pulmonary nodules nonspecific. CT CAP 04/02/2023: New peripherally sclerotic lesion superior endplate L2  suspicious for treated metastasis or Schmorl's node.  No other sites of metastatic disease identified CT CAP 07/06/2023: Subpectoral lymph node 7 mm (used to be 5 to 6 mm) ill-defined groundglass density right lower lobe bone metastases stable T11 L2 CT CAP 09/24/2023: Stable bone metastasis T11 L2 MRI neck: 10/26/2023: Left thyroid  nodule 2.5 cm unchanged, no findings in the neck 10/27/2023: Left leg x-rays: Mild osteoarthritis 12/15/2023: CT abdomen: Stable T11 and L2 03/23/24: CT CAP: Stable T 11 and L2 10/11/2024: CT CAP: 3 mm right upper lobe lung nodule new, focal lucency L1 stable T11 and T12 and bilateral iliac bones   Elevated creatinine: Follows with nephrology   Discussion: Recommended participation in preempt clinical trial (asymptomatic bone metastases: Radiation therapy) Zometa every 3 months: It is going to be held today because of his creatinine clearance.  She will drink more water and come back in a month to recheck the labs and proceed with Zometa at that time. MRI lumbar spine 10/20/2024: Widespread enhancing bone metastatic disease.  Mild L1 pathologic compression fracture stable  I discussed with the patient and her family that the pathologic compression fracture appears to be stable     No orders of the defined types were placed in this encounter.  The patient has a good understanding of the overall plan. she agrees with it. she will call with any problems that may develop before the next visit here.  I personally spent a total of 30 minutes in the care of the patient today including preparing to see the patient, getting/reviewing separately obtained history, performing a medically appropriate exam/evaluation, counseling and educating, placing orders, referring and communicating with other health care professionals, documenting clinical information in the EHR, independently interpreting results, communicating results, and coordinating care.   Viinay K Evony Rezek,  MD 11/02/2024

## 2024-11-03 ENCOUNTER — Ambulatory Visit: Payer: Self-pay | Admitting: Neurology

## 2024-11-03 ENCOUNTER — Ambulatory Visit (HOSPITAL_COMMUNITY)

## 2024-11-03 LAB — ATN PROFILE
A -- Beta-amyloid 42/40 Ratio: 0.155
Beta-amyloid 40: 302.61 pg/mL
Beta-amyloid 42: 46.83 pg/mL
N -- NfL, Plasma: 6.07 pg/mL — AB (ref 0.00–3.65)
T -- p-tau181: 8.17 pg/mL — AB (ref 0.00–0.97)

## 2024-11-03 LAB — TSH: TSH: 1.49 u[IU]/mL (ref 0.450–4.500)

## 2024-11-03 LAB — VITAMIN B12: Vitamin B-12: 502 pg/mL (ref 232–1245)

## 2024-11-08 ENCOUNTER — Other Ambulatory Visit: Payer: Self-pay

## 2024-11-14 ENCOUNTER — Other Ambulatory Visit (HOSPITAL_COMMUNITY): Payer: Self-pay

## 2024-11-17 ENCOUNTER — Other Ambulatory Visit: Payer: Self-pay

## 2024-11-17 NOTE — Progress Notes (Signed)
 Specialty Pharmacy Refill Coordination Note  Michele Page is a 69 y.o. female contacted today regarding refills of specialty medication(s) Palbociclib  (IBRANCE )   Patient requested Delivery   Delivery date: 11/21/24   Verified address: [address], [name] [address]   Medication will be filled on: 11/20/24

## 2024-11-20 ENCOUNTER — Other Ambulatory Visit: Payer: Self-pay

## 2024-11-21 ENCOUNTER — Other Ambulatory Visit: Payer: Self-pay | Admitting: *Deleted

## 2024-11-21 DIAGNOSIS — Z170 Estrogen receptor positive status [ER+]: Secondary | ICD-10-CM

## 2024-11-21 NOTE — Progress Notes (Signed)
 Signatera renewal orders placed.

## 2024-11-22 ENCOUNTER — Encounter: Payer: Self-pay | Admitting: Hematology and Oncology

## 2024-11-22 ENCOUNTER — Telehealth: Payer: Self-pay

## 2024-11-22 ENCOUNTER — Other Ambulatory Visit (HOSPITAL_COMMUNITY): Payer: Self-pay

## 2024-11-22 NOTE — Telephone Encounter (Signed)
 Oral Oncology Patient Advocate Encounter   Was successful in securing patient a $6 grant from [employer] ([employer]) to provide copayment coverage for Ibrance .  This will keep the out of pocket expense at $0.     The billing information is as follows and has been shared with [facility].   RxBin: [id number] PCN:  [id number] Member ID: [id number] Group ID: [id number] Dates of Eligibility: 05/26/2024 through 11/22/2025   Charlott Hamilton,  CPhT-Adv  she/her/hers [place]  [facility] Pharmacy Technician Patient Advocate Specialist III [facility] Phone: [phone]  Fax: [phone] [email or web address]

## 2024-11-27 ENCOUNTER — Other Ambulatory Visit: Payer: Self-pay | Admitting: Nurse Practitioner

## 2024-12-05 ENCOUNTER — Inpatient Hospital Stay: Attending: Hematology and Oncology

## 2024-12-05 ENCOUNTER — Inpatient Hospital Stay: Admitting: Hematology and Oncology

## 2024-12-05 ENCOUNTER — Inpatient Hospital Stay

## 2024-12-05 VITALS — BP 134/63 | HR 88 | Temp 97.8°F | Resp 18 | Ht 63.0 in | Wt 179.3 lb

## 2024-12-05 DIAGNOSIS — C50412 Malignant neoplasm of upper-outer quadrant of left female breast: Secondary | ICD-10-CM

## 2024-12-05 DIAGNOSIS — Z170 Estrogen receptor positive status [ER+]: Secondary | ICD-10-CM

## 2024-12-05 LAB — CBC WITH DIFFERENTIAL (CANCER CENTER ONLY)
Abs Immature Granulocytes: 0.01 K/uL (ref 0.00–0.07)
Basophils Absolute: 0.1 K/uL (ref 0.0–0.1)
Basophils Relative: 2 %
Eosinophils Absolute: 0 K/uL (ref 0.0–0.5)
Eosinophils Relative: 1 %
HCT: 26.1 % — ABNORMAL LOW (ref 36.0–46.0)
Hemoglobin: 8.9 g/dL — ABNORMAL LOW (ref 12.0–15.0)
Immature Granulocytes: 0 %
Lymphocytes Relative: 30 %
Lymphs Abs: 0.8 K/uL (ref 0.7–4.0)
MCH: 33.3 pg (ref 26.0–34.0)
MCHC: 34.1 g/dL (ref 30.0–36.0)
MCV: 97.8 fL (ref 80.0–100.0)
Monocytes Absolute: 0.2 K/uL (ref 0.1–1.0)
Monocytes Relative: 7 %
Neutro Abs: 1.5 K/uL — ABNORMAL LOW (ref 1.7–7.7)
Neutrophils Relative %: 60 %
Platelet Count: 338 K/uL (ref 150–400)
RBC: 2.67 MIL/uL — ABNORMAL LOW (ref 3.87–5.11)
RDW: 13.1 % (ref 11.5–15.5)
Smear Review: NORMAL
WBC Count: 2.5 K/uL — ABNORMAL LOW (ref 4.0–10.5)
nRBC: 0 % (ref 0.0–0.2)

## 2024-12-05 LAB — CMP (CANCER CENTER ONLY)
ALT: 9 U/L (ref 0–44)
AST: 22 U/L (ref 15–41)
Albumin: 4.5 g/dL (ref 3.5–5.0)
Alkaline Phosphatase: 139 U/L — ABNORMAL HIGH (ref 38–126)
Anion gap: 14 (ref 5–15)
BUN: 34 mg/dL — ABNORMAL HIGH (ref 8–23)
CO2: 19 mmol/L — ABNORMAL LOW (ref 22–32)
Calcium: 10 mg/dL (ref 8.9–10.3)
Chloride: 105 mmol/L (ref 98–111)
Creatinine: 2.48 mg/dL — ABNORMAL HIGH (ref 0.44–1.00)
GFR, Estimated: 20 mL/min — ABNORMAL LOW
Glucose, Bld: 98 mg/dL (ref 70–99)
Potassium: 5.3 mmol/L — ABNORMAL HIGH (ref 3.5–5.1)
Sodium: 137 mmol/L (ref 135–145)
Total Bilirubin: 0.3 mg/dL (ref 0.0–1.2)
Total Protein: 7.8 g/dL (ref 6.5–8.1)

## 2024-12-05 NOTE — Progress Notes (Signed)
 "  Patient Care Team: Caro Harlene POUR, NP as PCP - General (Geriatric Medicine) Court Dorn PARAS, MD as PCP - Cardiology (Cardiology) Odean Potts, MD as Consulting Physician (Oncology) Vanderbilt Ned, MD as Consulting Physician (General Surgery) Lucila Norleen LABOR, RPH-CPP as Pharmacist (Hematology and Oncology) Albertus, Gordy HERO, MD as Consulting Physician (Gastroenterology)  DIAGNOSIS:  Encounter Diagnosis  Name Primary?   Malignant neoplasm of upper-outer quadrant of left breast in female, estrogen receptor positive ([name]) Yes    SUMMARY OF ONCOLOGIC HISTORY: Oncology History  Breast cancer of 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)  03/31/2016 Initial Diagnosis   Screening detected left breast mass, 2 nodules, 1.9 x 1.6 x 0.8 cm= fat necrosis; 8 x 7 x 7 mm= grade 2 IDC ER 100%, PR 100%, HER-2 negative ratio 1.39, Ki-67 20%   04/17/2016 Surgery   Left lumpectomy ([name]): IDC grade 2, 1.3 cm, with DCIS, margins negative, 0/4 lymph nodes negative, T1 cN0 stage IA pathologic stage, ER 100%, PR 100%, HER-2 negative ratio 1.39, Ki-67 20% Oncotype DX score 20, 13% ROR, intermediate risk   04/17/2016 Oncotype testing   Recurrence score: 20; ROR 15% (intermediate risk)    05/27/2016 - 07/13/2016 Radiation Therapy   Adjuvant radiation therapy [facility]): Left breast treated with breath hold to 50.4 Gy in 28 fractions at 1.8 Gy/fraction.  Left breast boosted to 10 Gy in 5 fractions at 2 Gy/fraction   07/23/2016 -  Anti-estrogen oral therapy   Anastrozole  1 mg switched to letrozole  04/22/2017 due to hair loss, switch to tamoxifen  10/22/2017 due to hair loss from letrozole  as well   Malignant neoplasm of upper-outer quadrant of right breast in female, estrogen receptor positive ([name])  08/04/2021 Initial Diagnosis   Malignant neoplasm of upper-outer quadrant of right breast in female, estrogen receptor positive ([name])    Relapse/Recurrence   2.2 cm ILC ER 100%, PR 90%, Her 2 Neg, Ki 67: 25%,  Size 2.5 cm   08/04/2021 Cancer Staging   Staging form: Breast, AJCC 8th Edition - Clinical: Stage IB (cT2, cN0, cM0, G2, ER+, PR+, HER2-) - Signed by Odean Potts, MD on 08/04/2021 Stage prefix: Initial diagnosis Histologic grading system: 3 grade system   09/02/2021 Surgery   Right lumpectomy: Grade 2 ILC 3.1 cm, margins negative, LVI present, 4/6 lymph nodes macrometastases, 2 lymph nodes isolated tumor cells, ER 100%, PR 90%, Ki-67 25%, HER2 negative   10/17/2021 - 01/30/2022 Chemotherapy   Patient is on Treatment Plan : BREAST Adjuvant CMF IV q21d      Genetic Testing   Negative genetic testing. No pathogenic variants identified on the [employer] CustomNext+RNA panel. The report date is 03/04/2022.  The CustomNext-Cancer+RNAinsight panel offered by [employer] includes sequencing and rearrangement analysis for the following 47 genes:  APC, ATM, AXIN2, BARD1, BMPR1A, BRCA1, BRCA2, BRIP1, CDH1, CDK4, CDKN2A, CHEK2, DICER1, EPCAM, GREM1, HOXB13, MEN1, MLH1, MSH2, MSH3, MSH6, MUTYH, NBN, NF1, NF2, NTHL1, PALB2, PMS2, POLD1, POLE, PTEN, RAD51C, RAD51D, RECQL, RET, SDHA, SDHAF2, SDHB, SDHC, SDHD, SMAD4, SMARCA4, STK11, TP53, TSC1, TSC2, and VHL.  RNA data is routinely analyzed for use in variant interpretation for all genes.     CHIEF COMPLIANT: Follow-up of metastatic breast cancer on Ibrance   HISTORY OF PRESENT ILLNESS: History of Present Illness Michele Page is a 69 year old female with metastatic hormone receptor positive, HER2 negative breast cancer with bone-dominant disease who presents for routine oncology follow-up and management of ongoing symptoms.  She remains on letrozole  and palbociclib  for metastatic osseous  disease. November 2025 imaging and tumor markers (CA27.29, Signatera) showed stable disease within normal limits. She has no new pain, fatigue, nausea, or diarrhea and reports good energy with no new treatment-related adverse effects.  She has persistent, non-painful  lymphedema of the left hand. Swelling, which previously resolved, is now constant. She has misplaced the handpiece for her compression sleeve.  Her anemia has worsened over two months with hemoglobin down from 10 to 8.9 g/dL. She notes ongoing unintentional weight loss despite adequate oral intake. She understands the need to continue iron supplementation and increase dietary iron. She denies abnormal bleeding, bruising, or anemia symptoms and does not currently need transfusion.  She has stage 4 chronic kidney disease. Creatinine improved from 2.73 to 2.48 mg/dL, with GFR 20 mL/min, lower than prior values. She is increasing oral fluids with water, natural juices, and hydrating foods. She has a pending nephrology visit. She is not receiving Zometa because of low GFR.  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     ALLERGIES:  has no known allergies.  MEDICATIONS:  Current Outpatient Medications  Medication Sig Dispense Refill   allopurinol  (ZYLOPRIM ) 100 MG tablet TAKE 1/2 (ONE-HALF) TABLET BY MOUTH EVERY OTHER DAY 45 tablet 0   aspirin  EC 81 MG tablet Take 81 mg by mouth daily. Swallow whole.     atorvastatin  (LIPITOR) 20 MG tablet TAKE 1 TABLET EVERY DAY 90 tablet 3   carvedilol  (COREG ) 25 MG tablet Take 1 tablet (25 mg total) by mouth 2 (two) times daily. 180 tablet 3   letrozole  (FEMARA ) 2.5 MG tablet TAKE 1 TABLET EVERY DAY 90 tablet 3   NIFEdipine  (PROCARDIA  XL/NIFEDICAL-XL) 90 MG 24 hr tablet Take 1 tablet (90 mg total) by mouth daily. 90 tablet 1   oxybutynin  (DITROPAN ) 5 MG tablet Take 10 mg by mouth daily.     oxyCODONE -acetaminophen  (PERCOCET/ROXICET) 5-325 MG tablet Take 1 tablet by mouth every 6 (six) hours as needed for severe pain (pain score 7-10). 15 tablet 0   palbociclib  (IBRANCE ) 75 MG tablet Take 1 tablet (75 mg total) by  mouth daily. Take for 21 days on, 7 days off, repeat every 28 days. 21 tablet 3   spironolactone  (ALDACTONE ) 25 MG tablet Take 1 tablet (25 mg total) by mouth daily. 90 tablet 3   No current facility-administered medications for this visit.    PHYSICAL EXAMINATION: ECOG PERFORMANCE STATUS: 1 - Symptomatic but completely ambulatory  Vitals:   12/05/24 1306  BP: 134/63  Pulse: 88  Resp: 18  Temp: 97.8 F (36.6 C)  SpO2: 100%   Filed Weights   12/05/24 1306  Weight: 179 lb 4.8 oz (81.3 kg)   LABORATORY DATA:  I have reviewed the data as listed    Latest Ref Rng & Units 12/05/2024   12:24 PM 10/29/2024   12:57 PM 10/10/2024    1:33 PM  CMP  Glucose 70 - 99 mg/dL 98  886  96   BUN 8 - 23 mg/dL 34  35  20   Creatinine 0.44 - 1.00 mg/dL 7.51  7.26  7.99   Sodium 135 - 145 mmol/L 137  136  140   Potassium 3.5 - 5.1 mmol/L 5.3  4.8  4.7   Chloride 98 - 111 mmol/L 105  105  104   CO2 22 - 32 mmol/L 19  20  20   Calcium  8.9 - 10.3 mg/dL 89.9  9.8  9.8   Total Protein 6.5 - 8.1 g/dL 7.8     Total Bilirubin 0.0 - 1.2 mg/dL 0.3     Alkaline Phos 38 - 126 U/L 139     AST 15 - 41 U/L 22     ALT 0 - 44 U/L 9       Lab Results  Component Value Date   WBC 2.5 (L) 12/05/2024   HGB 8.9 (L) 12/05/2024   HCT 26.1 (L) 12/05/2024   MCV 97.8 12/05/2024   PLT 338 12/05/2024   NEUTROABS PENDING 12/05/2024    ASSESSMENT & PLAN:  Breast cancer of 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) 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, 1.3 cm, with DCIS, margins negative, 0/4 lymph nodes negative, T1 cN0 stage IA pathologic stage, ER 100%, PR 100%, HER-2 negative ratio 1.39, Ki-67 20% (Originally 2 nodules were detected on screening mammogram 1.9 cm= fat necrosis and 8 mm IDC) Oncotype DX score 20, 13% risk of recurrence, intermediate risk Adjuvant radiation therapy started 07/20/2017completed 07/13/2016     09/02/2021:Right lumpectomy: Grade 2 ILC 3.1 cm, margins negative, LVI  present, 4/6 lymph nodes macrometastases, 2 lymph nodes isolated tumor cells, ER 100%, PR 90%, Ki-67 25%, HER2 negative   Treatment plan/summary: 1. systemic chemotherapy with CMF x6 cycles completed 01/30/2022 2. adjuvant radiation therapy completing 04/09/2022 3.  For the continued antiestrogen therapy (previously anastrozole  started 07/23/2016 switched to letrozole  2018 switched to tamoxifen  2018, switched back to anastrozole ) hair loss was the main problem, may consider exemestane --------------------------------------------------------------------------------------   Current treatment: Letrozole  with Ibrance .  Patient could not tolerate Verzinio Toxicities: Fatigue Leukopenia: ANC 1.9   Signatera:  06/12/2022: Positive (0.52) 09/02/2022: Positive (68.11) Mar 22, 2023: Positive (106.54) 07/06/2023: Positive (662) 09/13/2023: Positive (57.14) 03/07/2024: 109.06 09/24/2024: 29.94   CT CAP 07/10/2022: No metastatic disease.  Tiny pulmonary nodules nonspecific. MRI neck: 10/26/2023: Left thyroid  nodule 2.5 cm unchanged, no findings in the neck 10/11/2024: CT CAP: 3 mm right upper lobe lung nodule new, focal lucency L1 stable T11 and T12 and bilateral iliac bones MRI lumbar spine 10/20/2024: Widespread enhancing bone metastatic disease. Mild L1 pathologic compression fracture stable    Elevated creatinine: Has an appointment with nephrology coming up Lymphedema of the hand: Will send a referral to physical therapy  Zometa every 3 months (could not be started because of renal failure) Follow-up in 2 months to recheck the labs and Zometa if her creatinine is improving.  Her insurance does not permit Xgeva.  No orders of the defined types were placed in this encounter.  The patient has a good understanding of the overall plan. she agrees with it. she will call with any problems that may develop before the next visit here.  I personally spent a total of 30 minutes in the care of the patient today  including preparing to see the patient, getting/reviewing separately obtained history, performing a medically appropriate exam/evaluation, counseling and educating, placing orders, referring and communicating with other health care professionals, documenting clinical information in the EHR, independently interpreting results, communicating results, and coordinating care.   Viinay K Deosha Werden, MD 12/05/24    "

## 2024-12-05 NOTE — Assessment & Plan Note (Signed)
 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 systemic chemotherapy with CMF x6 cycles completed 01/30/2022 2. adjuvant radiation therapy completing 04/09/2022 3.  For the continued antiestrogen therapy (previously anastrozole  started 07/23/2016 switched to letrozole  2018 switched to tamoxifen  2018, switched back to anastrozole ) hair loss was the main problem, may consider exemestane --------------------------------------------------------------------------------------   Current treatment: Letrozole  with Ibrance .  Patient could not tolerate Verzinio Toxicities: Fatigue Leukopenia: ANC 1.9   Signatera:  06/12/2022: Positive (0.52) 09/02/2022: Positive (68.11) Mar 22, 2023: Positive (106.54) 07/06/2023: Positive (662) 09/13/2023: Positive (57.14) 03/07/2024: 109.06 09/24/2024: 29.94   CT CAP 07/10/2022: No metastatic disease.  Tiny pulmonary nodules nonspecific. MRI neck: 10/26/2023: Left thyroid  nodule 2.5 cm unchanged, no findings in the neck 10/11/2024: CT CAP: 3 mm right upper lobe lung nodule new, focal lucency L1 stable T11 and T12 and bilateral iliac bones MRI lumbar spine 10/20/2024: Widespread enhancing bone metastatic disease. Mild L1 pathologic compression fracture stabl    Elevated creatinine: Follows with nephrology Recommend participation in the preempt clinical trial Zometa every 3 months

## 2024-12-06 ENCOUNTER — Ambulatory Visit: Attending: Hematology and Oncology

## 2024-12-06 DIAGNOSIS — M25511 Pain in right shoulder: Secondary | ICD-10-CM | POA: Insufficient documentation

## 2024-12-06 DIAGNOSIS — C50412 Malignant neoplasm of upper-outer quadrant of left female breast: Secondary | ICD-10-CM | POA: Insufficient documentation

## 2024-12-06 DIAGNOSIS — C50411 Malignant neoplasm of upper-outer quadrant of right female breast: Secondary | ICD-10-CM | POA: Insufficient documentation

## 2024-12-06 DIAGNOSIS — I890 Lymphedema, not elsewhere classified: Secondary | ICD-10-CM | POA: Insufficient documentation

## 2024-12-06 DIAGNOSIS — Z170 Estrogen receptor positive status [ER+]: Secondary | ICD-10-CM | POA: Insufficient documentation

## 2024-12-06 DIAGNOSIS — G8929 Other chronic pain: Secondary | ICD-10-CM | POA: Insufficient documentation

## 2024-12-06 DIAGNOSIS — M7989 Other specified soft tissue disorders: Secondary | ICD-10-CM | POA: Insufficient documentation

## 2024-12-06 LAB — CANCER ANTIGEN 27.29: CA 27.29: 40.7 U/mL — ABNORMAL HIGH (ref 0.0–38.6)

## 2024-12-06 NOTE — Therapy (Signed)
 " OUTPATIENT PHYSICAL THERAPY  UPPER EXTREMITY ONCOLOGY EVALUATION  Patient Name: Michele Page MRN: [id number] DOB:May 21, 1956, 69 y.o., female Today's Date: 12/06/2024  END OF SESSION:  PT End of Session - 12/06/24 1059     Visit Number 4    Number of Visits 12    Date for Recertification  01/17/25    Authorization Type [employer]    Authorization Time Period 12/06/2024-    PT Start Time 1100    PT Stop Time 1150    PT Time Calculation (min) 50 min    Activity Tolerance Patient tolerated treatment well    Behavior During Therapy WFL for tasks assessed/performed          Past Medical History:  Diagnosis Date   Anemia    past hx many yrs ago    Arthritis    lt knee   Breast cancer ([name])    Both breasts   Breast cancer of 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) 04/02/2016   Chronic kidney disease    CKD   Colon polyps    COPD (chronic obstructive pulmonary disease) ([name])    GERD (gastroesophageal reflux disease)    years ago- not current    Hyperlipidemia    Hypertension    Multiple sclerosis    Personal history of chemotherapy    Personal history of radiation therapy    Stroke ([name])    2013, mild cognitive deficits   Vision abnormalities    Past Surgical History:  Procedure Laterality Date   BREAST BIOPSY Left 03/31/2016   BREAST BIOPSY Right 07/24/2021   BREAST LUMPECTOMY Left 04/17/2016   BREAST LUMPECTOMY Right 09/02/2021   BREAST LUMPECTOMY WITH RADIOACTIVE SEED AND SENTINEL LYMPH NODE BIOPSY Right 09/02/2021   Procedure: RIGHT BREAST LUMPECTOMY WITH RADIOACTIVE SEED AND SENTINEL LYMPH NODE BIOPSY;  Surgeon: Vanderbilt Ned, MD;  Location: [facility];  Service: General;  Laterality: Right;   COLONOSCOPY     HYSTEROSCOPY WITH D & C N/A 12/19/2015   Procedure: DILATATION AND CURETTAGE /HYSTEROSCOPY;  Surgeon: Duwaine Blumenthal, DO;  Location: [facility];  Service: Gynecology;  Laterality: N/A;   POLYPECTOMY     PORTACATH PLACEMENT Right  10/16/2021   Procedure: INSERTION PORT-A-CATH;  Surgeon: Vanderbilt Ned, MD;  Location: [facility];  Service: General;  Laterality: Right;   RADIOACTIVE SEED GUIDED PARTIAL MASTECTOMY WITH AXILLARY SENTINEL LYMPH NODE BIOPSY Left 04/17/2016   Procedure: RADIOACTIVE SEED GUIDED PARTIAL MASTECTOMY WITH AXILLARY SENTINEL LYMPH NODE BIOPSY;  Surgeon: Ned Vanderbilt, MD;  Location: [facility];  Service: General;  Laterality: Left;  RADIOACTIVE SEED GUIDED PARTIAL MASTECTOMY WITH AXILLARY SENTINEL LYMPH NODE BIOPSY    TUBAL LIGATION     VAGINAL DELIVERY     x3   Patient Active Problem List   Diagnosis Date Noted   Pain in right shoulder 09/19/2024   Positive D dimer 07/25/2024   RLQ abdominal pain 07/25/2024   Leukopenia 07/25/2024   Macrocytic anemia 07/25/2024   Elevated troponin 07/25/2024   Overactive bladder 06/05/2024   Chronic gout of left hand 06/05/2024   Morbid obesity ([name]) 06/05/2024   Port-A-Cath in place 11/06/2021   Malignant neoplasm of upper-outer quadrant of right breast in female, estrogen receptor positive ([name]) 08/04/2021   Neck pain on right side 06/01/2018   History of breast cancer 11/25/2017   Primary insomnia 11/25/2017   Breast cancer of 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) 04/02/2016   Essential hypertension 01/10/2016  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 01/10/2016   Gastroesophageal reflux disease without esophagitis 01/10/2016   Hyperlipidemia 01/10/2016   CKD (chronic kidney disease) stage 4, GFR 15-29 ml/min ([name]) 01/10/2016   Multiple sclerosis 03/20/2015   Accelerated hypertension 03/20/2015   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/2015    PCP:    REFERRING PROVIDER: Mackey Chad MD  REFERRING DIAG: Rigth UE lymphedema  THERAPY DIAG:  Swelling of limb  Lymphedema, not elsewhere classified  Malignant neoplasm of upper-outer quadrant of right  female breast, unspecified estrogen receptor status ([name])  Chronic right shoulder pain  ONSET DATE: 6 months ago, would swell and reduce.  Rationale for Evaluation and Treatment: Rehabilitation  SUBJECTIVE:                                                                                                                                                                                           SUBJECTIVE STATEMENT:  I wore the sleeve for a while but it felt very tight. I would change from the sleeve to the wrap by myself. None of it seemed to work, but I wasn't wearing the sleeve every day. Pain is now in the upper arm to the elbow mainly but all the way around the arm. Swelling in the arm has increased.  PERTINENT HISTORY:  -History of right lumpectomy 09/02/21 with 4/6 nodes positive with chemotherapy and radiation completed, Hx of Lt lumpectomy in 2017 with 4 negative nodes removed and radiation.  Recently started Ibrance  09/28/22. Recent DVT study is negative, does show subcutaneous edema edema on dorsal aspect of right hand and distal forearm.   PAIN:  10/19/24: No pain currently   Eval:  Are you having pain? Yes, pain starts in shoulder and she can't move it like she did NPRS scale:0/10 at rest  8/10 Pain location: Right shoulder and upper arm Pain orientation: Right  PAIN TYPE:  tender/tight Pain description: intermittent  Aggravating factors: raising arm, doesn't lie on right side, Relieving factors: nothing  PRECAUTIONS: bil lymphedema risk , CVA affecting right UE 2010?  RED FLAGS: Bowel or bladder incontinence: Yes:     WEIGHT BEARING RESTRICTIONS: No  FALLS:  Has patient fallen in last 6 months? 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  COGNITION: Overall cognitive status: Within functional limits for tasks assessed   PALPATION: Mild pitting edema right ulnar border and dorsum of hand  OBSERVATIONS / OTHER ASSESSMENTS:   SENSATION: Light touch: Appears intact   POSTURE: rounded shoulders, forward head   UPPER EXTREMITY AROM/PROM:  A/PROM RIGHT   eval  RIGHT 12/06/2024  Shoulder extension    Shoulder flexion 38, P!/PROM supine to approx 70 degrees 30 with compensation. 124 supine AAROM  Shoulder abduction 38, P!   Shoulder internal rotation    Shoulder external rotation      (Blank rows = not tested)  A/PROM LEFT   eval  Shoulder extension   Shoulder flexion 151  Shoulder abduction 152  Shoulder internal rotation   Shoulder external rotation     (Blank rows = not tested)  CERVICAL AROM: All within normal limits:      UPPER EXTREMITY STRENGTH:   LYMPHEDEMA ASSESSMENTS:   SURGERY TYPE/DATE: right lumpectomy  with SLNB 09/02/21,Lt lumpectomy with SLNB in 2017  NUMBER OF LYMPH NODES REMOVED: 4+/6 right, 0+/4 left  CHEMOTHERAPY: YES  RADIATION:YES  HORMONE TREATMENT: YES  INFECTIONS: NO   LYMPHEDEMA ASSESSMENTS:   LANDMARK RIGHT  eval RIGHT 10/26/2024 RIGHT 12/06/2024  At axilla  31.8    15 cm proximal to the proximal aspect of the olecranon process 30.2 30.5 31.1  10 cm proximal to the proximal aspect of the olecranon process 29.6 29.6 29.9  Olecranon process 27.0 26.5 27.3  15 cm proximal to the proximal aspect of the ulnar styloid process 26.3 25.5 26.9  10 cm proximal to the proximal aspect of the ulnar styloid process 23.6 21.5 23.5  Just distal to the ulnar styloid process 18.2 17 17.4  Across hand at thumb web space 22.7 21 22.3  At base of 2nd digit 6.9 6.5 6.8  (Blank rows = not  tested)  LANDMARK LEFT  eval LEFT 12/06/2024  At axilla  32.2   15 cm proximal to the proximal aspect of the olecranon process 30.2 29.0  10 cm proximal to the proximal aspect of the olecranon process 27.4 27.0  Olecranon process 24.6 24.2  15 cm proximal to the proximal aspect of the ulnar styloid process 24.2 23.7  10 cm proximal to  the proximal aspect of the ulnar styloid process 21.0 20.8  Just distal to the ulnar styloid process 15.7 15.6  Across hand at thumb web space 21.0 19.7  At base of 2nd digit 6.4 6.3  (Blank rows = not tested)  Chest circumference just inferior to the axillae:  Chest circumference at the largest point:    Lymphedema life impact scale: 57% EVAL: 12/06/2024 36%                                                                                                                            TREATMENT DATE:  Pt permission and consent throughout  each step of examination and treatment with modification and draping if requested when working on sensitive areas.  Pt is aware that a clinic chaperone is not available.  12/06/2024 Reeval Discussed with pt again due to her questions, how lymphedema is diagnosed, and what causes swelling after breast surgery with LN removal. Showed poster and how superficial lymph vessels are and why MLD is so gentle. Discussed bandaging as a means to reduce swelling, and then when arm edema is not changing return to  compression garments to maintain swelling. Pt would benefit from a night garment as well . Will show again next visit. In supine: Short neck, 5 diaphragmatic breaths, R inguinal nodes and establishment of axilloinguinal pathway, then R UE working proximal to distal, moving inner upper arm outwards and upwards, and doing both sides of forearm, and dorsum of hand, spending extra time in any areas of fibrosis then retracing all steps - skipped L axillary nodes and pathway due to bilateral lymph node removal. Pt forgot her compression  bandages but was advised to  bring next time Educated in importance of consistency with compression to reduce/maintain edema.  10/25/2024 Therapeutic Exercise: Supine chest press with dowel x 10 Supine dowel flexion x  Supine AAAROM 158 Right shoulder flexion Manual Therapy:  In supine: Short neck, 5 diaphragmatic breaths, R inguinal nodes and establishment of axilloinguinal pathway, then R UE working proximal to distal, moving inner upper arm outwards and upwards, and doing both sides of forearms, spending extra time in any areas of fibrosis then retracing all steps - skipped L axillary nodes and pathway due to bilateral lymph node removal. Pt forgot her compression bandages but brought her sleeve/glove. Advised to bring next time Pt assisted with donning exostrong sleeve/glove. Explained folding in half and getting up over wrist first. Able to don with occasional assist, and able to don glove. Good capillary refill, but advised if she feels hand is too cold or getting numb to remove. Reminded sleve and glove only during the day. Discussed laundering every 2 days. Asked pt to recite instructions back to me due to some memory deficits noted Wrap next visit if measurements still up; asked pt to have her daughter come.    10/19/24 Therapeutic Exercise: Flexion table slides with ball on plinth x 10 Supine chest press with dowel x 10 Supine dowel flexion x 10  Manual Therapy:  In supine: Short neck, 5 diaphragmatic breaths, R inguinal nodes and establishment of axilloinguinal pathway, then R UE working proximal to distal, moving inner upper arm outwards and upwards, and doing both sides of forearms, spending extra time in any areas of fibrosis then retracing all steps - skipped L axillary nodes and pathway due to bilateral lymph node removal Then showed daughter Normand) link on [facility] and we made a video of her own as well.  Lotion, tg soft, fingers 1-4, artiflex hand to axilla, 6cm hand, 8cm  and 10cm spiral from wrist to axilla with instruction.  Review of when to remove, watching for SOB, and remedial exercises    Eval: Discussed with pt and her daughter how lymphedema is diagnosed, and what causes swelling after breast surgery with LN removal. Showed poster and how superficial lymph vessels are and why MLD is so gentle. Discussed bandaging as a means to reduce swelling, and then when arm is not changing get fit for compression garments to maintain swelling. Might benefit from a night garment as well and showed them circaid profile. Showed pt AAROM for  right shoulder flexion and practiced x 4. Did not remember to give pictures.    PATIENT EDUCATION:  Education details: how lymphedema is diagnosed, and what causes swelling after breast surgery with LN removal. Showed poster and how superficial lymph vessels are and why MLD is so gentle. Discussed bandaging as a means to reduce swelling, and then when arm is not changing get fit for compression garments to maintain swelling. Might benefit from a night garment as well and showed them circaid profile. Showed pt AAROM for right shoulder flexion and practiced x 4. Did not remember to give pictures. Discussed LOS, treatment interventions, and advised to schedule as soon as possible. She will need to check [employer] schedule before she calls. Also advised pt to find her compression garments from 2023 and bring them in so we can check them. Person educated: Patient and Child(ren) Education method: Explanation Education comprehension: verbalized understanding and needs further education  HOME EXERCISE PROGRAM:    ASSESSMENT:  CLINICAL IMPRESSION: Pt returns for Re-evaluation with complaints of increased swelling and pain in her right arm. She was not consistent wearing the sleeve or bandages, and she can't find her hand piece. We had a lengthy discussion today about consistency with bandaging/sleeve to maintain reduction, and the nature of  lymphedema which must always be controlled, and will not go entirely away. It requires consistency and diligence. She is still trying to work 2 days at [employer]. Recommended 3x's per week to enable her to always have a good wrap on and for us  to instruct Heidi so there would be consistency when she is not here for improved reduction to get her into her garments ASAP. We also discussed the night garment as a great option for her to maintain reduction. She does appear to have some memory deficits, and will need reinforcement.  OBJECTIVE IMPAIRMENTS: decreased activity tolerance, decreased knowledge of condition, decreased ROM, decreased strength, increased edema, impaired UE functional use, and pain.   ACTIVITY LIMITATIONS: lifting, sleeping, bathing, dressing, reach over head, and hygiene/grooming  PARTICIPATION LIMITATIONS: cleaning, shopping, community activity, and occupation  PERSONAL FACTORS: 1-2 comorbidities: bilateral breast cancer sx, chemo, radiation, lymphedema, shoulder pain are also affecting patient's functional outcome.   REHAB POTENTIAL: Good  CLINICAL DECISION MAKING: Evolving/moderate complexity  EVALUATION COMPLEXITY: Moderate  GOALS: Goals reviewed with patient? Yes  SHORT TERM GOALS: Target date: 12/27/2024  Pt will be independent in a HEP for Right shoulder ROM Baseline: Goal status: INITIAL  2.  Pts daughter/pt will be independent in compression bandaging to pts right UE Baseline:  Goal status: INITIAL  3.  Pt will be independent with MLD to the right UE Baseline:  Goal status: INITIAL  4.  Pt will have AROM right shoulder to atleast 90 degrees flexion for improved reaching Baseline:  Goal status: INITIAL  5.  Pt will have 1 cm reduction at 15 cm prox to ulna styloid process Baseline:  Goal status: INITIAL  LONG TERM GOALS: Target date: 01/17/2025 Pts edema reduction will plateau to allow pt to start in her compression garments Baseline:  Goal status:  INITIAL  2.  Pt will receive new compression garments prn, and may consider night garment prn Baseline:  Goal status: INITIAL  3.  Pts right shoulder/arm pain will improve to no greater than 4/10 for improved function Baseline:  Goal status: INITIAL    PLAN:  PT FREQUENCY: 2-3 times/week x 6 weeks or 16 visits prn  PT DURATION: 8 weeks  PLANNED INTERVENTIONS:  02835- PT Re-evaluation, 97110-Therapeutic exercises, 97530- Therapeutic activity, [id number]- Neuromuscular re-education, 97535- Self Care, [id number]- Manual therapy, 97760- Orthotic Initial, [id number]- Orthotic/Prosthetic subsequent, and Patient/Family education  PLAN FOR NEXT SESSION: Pt. To bring in bandages for next visit,Instruct Heidi in compression bandaging if possible  or pt and  have her demonstrate, AAROM for right shoulder, MLD (inguinal nodes only due to bil) ,CDT, check previous garments when she brings, show circaid night again Assess for bandage comfort and tolerance - GFR is only 27%   Grayce Sheldon, PT 12/06/24 11:52 AM  "

## 2024-12-07 ENCOUNTER — Other Ambulatory Visit (HOSPITAL_COMMUNITY): Payer: Self-pay

## 2024-12-09 ENCOUNTER — Other Ambulatory Visit: Payer: Self-pay | Admitting: Nurse Practitioner

## 2024-12-09 DIAGNOSIS — F5101 Primary insomnia: Secondary | ICD-10-CM

## 2024-12-11 ENCOUNTER — Ambulatory Visit: Payer: Self-pay | Admitting: Nurse Practitioner

## 2024-12-11 NOTE — Telephone Encounter (Signed)
 Requested medication is not on patients active medication list. Patient would need an appointment to discuss restarting.

## 2024-12-12 ENCOUNTER — Ambulatory Visit

## 2024-12-12 DIAGNOSIS — G8929 Other chronic pain: Secondary | ICD-10-CM

## 2024-12-12 DIAGNOSIS — C50411 Malignant neoplasm of upper-outer quadrant of right female breast: Secondary | ICD-10-CM

## 2024-12-12 DIAGNOSIS — I890 Lymphedema, not elsewhere classified: Secondary | ICD-10-CM

## 2024-12-12 DIAGNOSIS — M7989 Other specified soft tissue disorders: Secondary | ICD-10-CM

## 2024-12-12 NOTE — Therapy (Signed)
 " OUTPATIENT PHYSICAL THERAPY  UPPER EXTREMITY ONCOLOGY TREATMENT  Patient Name: Michele Page MRN: [id number] DOB:April 29, 1956, 69 y.o., female Today's Date: 12/12/2024  END OF SESSION:  PT End of Session - 12/12/24 1153     Visit Number 5    Number of Visits 12    Date for Recertification  01/17/25    Authorization Type [employer]    Authorization Time Period Cohere authorized 1re-eval & 16 visits 12/06/24-01/17/25    Authorization - Visit Number 2    Authorization - Number of Visits 16    PT Start Time 1149    PT Stop Time 1259    PT Time Calculation (min) 70 min    Activity Tolerance Patient tolerated treatment well    Behavior During Therapy WFL for tasks assessed/performed          Past Medical History:  Diagnosis Date   Anemia    past hx many yrs ago    Arthritis    lt knee   Breast cancer ([name])    Both breasts   Breast cancer of 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) 04/02/2016   Chronic kidney disease    CKD   Colon polyps    COPD (chronic obstructive pulmonary disease) ([name])    GERD (gastroesophageal reflux disease)    years ago- not current    Hyperlipidemia    Hypertension    Multiple sclerosis    Personal history of chemotherapy    Personal history of radiation therapy    Stroke ([name])    2013, mild cognitive deficits   Vision abnormalities    Past Surgical History:  Procedure Laterality Date   BREAST BIOPSY Left 03/31/2016   BREAST BIOPSY Right 07/24/2021   BREAST LUMPECTOMY Left 04/17/2016   BREAST LUMPECTOMY Right 09/02/2021   BREAST LUMPECTOMY WITH RADIOACTIVE SEED AND SENTINEL LYMPH NODE BIOPSY Right 09/02/2021   Procedure: RIGHT BREAST LUMPECTOMY WITH RADIOACTIVE SEED AND SENTINEL LYMPH NODE BIOPSY;  Surgeon: Vanderbilt Ned, MD;  Location: [facility];  Service: General;  Laterality: Right;   COLONOSCOPY     HYSTEROSCOPY WITH D & C N/A 12/19/2015   Procedure: DILATATION AND CURETTAGE /HYSTEROSCOPY;  Surgeon: Duwaine Blumenthal, DO;  Location: [facility];  Service: Gynecology;  Laterality: N/A;   POLYPECTOMY     PORTACATH PLACEMENT Right 10/16/2021   Procedure: INSERTION PORT-A-CATH;  Surgeon: Vanderbilt Ned, MD;  Location: [facility];  Service: General;  Laterality: Right;   RADIOACTIVE SEED GUIDED PARTIAL MASTECTOMY WITH AXILLARY SENTINEL LYMPH NODE BIOPSY Left 04/17/2016   Procedure: RADIOACTIVE SEED GUIDED PARTIAL MASTECTOMY WITH AXILLARY SENTINEL LYMPH NODE BIOPSY;  Surgeon: Ned Vanderbilt, MD;  Location: [facility];  Service: General;  Laterality: Left;  RADIOACTIVE SEED GUIDED PARTIAL MASTECTOMY WITH AXILLARY SENTINEL LYMPH NODE BIOPSY    TUBAL LIGATION     VAGINAL DELIVERY     x3   Patient Active Problem List   Diagnosis Date Noted   Pain in right shoulder 09/19/2024   Positive D dimer 07/25/2024   RLQ abdominal pain 07/25/2024   Leukopenia 07/25/2024   Macrocytic anemia 07/25/2024   Elevated troponin 07/25/2024   Overactive bladder 06/05/2024   Chronic gout of left hand 06/05/2024   Morbid obesity ([name]) 06/05/2024   Port-A-Cath in place 11/06/2021   Malignant neoplasm of upper-outer quadrant of right breast in female, estrogen receptor positive ([name]) 08/04/2021   Neck pain on right side 06/01/2018   History of breast cancer 11/25/2017  Primary insomnia 11/25/2017   Breast cancer of 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) 04/02/2016   Essential hypertension 01/10/2016   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 01/10/2016   Gastroesophageal reflux disease without esophagitis 01/10/2016   Hyperlipidemia 01/10/2016   CKD (chronic kidney disease) stage 4, GFR 15-29 ml/min ([name]) 01/10/2016   Multiple sclerosis 03/20/2015   Accelerated hypertension 03/20/2015   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/2015    PCP:    REFERRING PROVIDER: Mackey Chad MD  REFERRING DIAG: Rigth UE lymphedema  THERAPY  DIAG:  Swelling of limb  Lymphedema, not elsewhere classified  Malignant neoplasm of upper-outer quadrant of right female breast, unspecified estrogen receptor status ([name])  Chronic right shoulder pain  ONSET DATE: 6 months ago, would swell and reduce.  Rationale for Evaluation and Treatment: Rehabilitation  SUBJECTIVE:                                                                                                                                                                                           SUBJECTIVE STATEMENT:  I brought my bandages from when you guys last wrapped me. I can only come 2x/wk because of my co pay.   PERTINENT HISTORY:  -History of right lumpectomy 09/02/21 with 4/6 nodes positive with chemotherapy and radiation completed, Hx of Lt lumpectomy in 2017 with 4 negative nodes removed and radiation.  Recently started Ibrance  09/28/22. Recent DVT study is negative, does show subcutaneous edema edema on dorsal aspect of right hand and distal forearm.   PAIN:     Eval:  Are you having pain? Yes, pain starts in shoulder and she can't move it like she did NPRS scale:0/10 at rest  8/10 Pain location: Right shoulder and upper arm Pain orientation: Right  PAIN TYPE:  tender/tight Pain description: intermittent  Aggravating factors: raising arm, doesn't lie on right side, when arm is heavier it hurts more Relieving factors: nothing  PRECAUTIONS: bil lymphedema risk , CVA affecting right UE 2010?  RED FLAGS: Bowel or bladder incontinence: Yes:     WEIGHT BEARING RESTRICTIONS: No  FALLS:  Has patient fallen in last 6 months? 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  COGNITION: Overall cognitive status: Within functional limits for tasks assessed   PALPATION: Mild pitting edema right ulnar border and dorsum of hand  OBSERVATIONS / OTHER ASSESSMENTS:   SENSATION: Light touch: Appears intact   POSTURE: rounded shoulders, forward head   UPPER EXTREMITY AROM/PROM:  A/PROM RIGHT   eval  RIGHT 12/06/2024  Shoulder extension    Shoulder flexion 38, P!/PROM supine to approx 70 degrees 30 with compensation. 124 supine AAROM  Shoulder abduction 38, P!   Shoulder internal rotation    Shoulder external rotation      (Blank rows = not tested)  A/PROM LEFT   eval  Shoulder extension   Shoulder flexion 151  Shoulder abduction 152  Shoulder internal rotation   Shoulder external rotation     (Blank rows = not tested)  CERVICAL AROM: All within normal limits:      UPPER EXTREMITY STRENGTH:   LYMPHEDEMA ASSESSMENTS:   SURGERY TYPE/DATE: right lumpectomy  with SLNB 09/02/21,Lt lumpectomy with SLNB in 2017  NUMBER OF LYMPH NODES REMOVED: 4+/6 right, 0+/4 left  CHEMOTHERAPY: YES  RADIATION:YES  HORMONE TREATMENT: YES  INFECTIONS: NO   LYMPHEDEMA ASSESSMENTS:   LANDMARK RIGHT  eval RIGHT 10/26/2024 RIGHT 12/06/2024  At axilla  31.8    15 cm proximal to the proximal aspect of the olecranon process 30.2 30.5 31.1  10 cm proximal to the proximal aspect of the olecranon process 29.6 29.6 29.9  Olecranon process 27.0 26.5 27.3  15 cm proximal to the proximal aspect of the ulnar styloid process 26.3 25.5 26.9  10 cm proximal to the proximal aspect of the ulnar styloid process 23.6 21.5 23.5  Just distal to the ulnar styloid process 18.2 17 17.4  Across hand at thumb web space 22.7 21 22.3  At base of 2nd digit 6.9 6.5 6.8  (Blank rows = not tested)  LANDMARK LEFT  eval LEFT 12/06/2024  At axilla  32.2   15 cm proximal  to the proximal aspect of the olecranon process 30.2 29.0  10 cm proximal to the proximal aspect of the olecranon process 27.4 27.0  Olecranon process 24.6 24.2  15 cm proximal to the proximal aspect of the ulnar styloid process 24.2 23.7  10 cm proximal to  the proximal aspect of the ulnar styloid process 21.0 20.8  Just distal to the ulnar styloid process 15.7 15.6  Across hand at thumb web space 21.0 19.7  At base of 2nd digit 6.4 6.3  (Blank rows = not tested)  Chest circumference just inferior to the axillae:  Chest circumference at the largest point:    Lymphedema life impact scale: 57% EVAL: 12/06/2024 36%                                                                                                                            TREATMENT DATE:  Pt permission and consent throughout each step of examination and treatment with modification and draping if requested  when working on sensitive areas.  Pt is aware that a clinic chaperone is not available.  12/12/2024: Self Care Answered pts questions about lymphedema and spent time explaining why it is important to wear her compression every day, just like she has to wear her glasses every day. And explained that if she gets to a point again that her arm doesn't appear swollen, she should still wear her compression sleeve daily because her lymphedema has shown us  that her lymph nodes, at this point, are always going to need external support from a sleeve. Pt able to verbalize understanding but may need further reinforcement. Also spent time answering her questions about her intermittent arm pain. She demonstrates compensations when she abducts her arm so spent time explaining how continuing compensations will further irritate her arm and demonstrated ways she could begin stretching during the day, like in doorway or reaching to high cabinet shelf, while trying to decrease scapular compensations.  Manual Therapy MLD to Rt UE: Short neck,  superficial and deep abdominals, Rt inguinal nodes, Rt axillo-inguinal anastomosis, Rt UE working from proximal to distal, then retracing all steps.  STM to Rt upper arm over bicep where pt palpably tight P/ROM in supine to Rt shoulder into flexion with scapular depression by PTA, trial of abduction but pt had trouble relaxing and had sharp pains so switched to scaption which she tolerated without pain.   Compression Bandaging: Cocoa butter to Rt UE, TG soft, Mollelast to fingers 1-5 with 1.5 wraps, Artiflex from hand to axilla, then short stretch compression bandage as follows:  1-6 cm to hand/wrist 1-8 cm from wrist to X at elbow 1-10 cm spiral from wrist to axilla Reviewed technique of bandaging while applying with pt and issued handout as well for further reinforcement.   12/06/2024 Reeval Discussed with pt again due to her questions, how lymphedema is diagnosed, and what causes swelling after breast surgery with LN removal. Showed poster and how superficial lymph vessels are and why MLD is so gentle. Discussed bandaging as a means to reduce swelling, and then when arm edema is not changing return to  compression garments to maintain swelling. Pt would benefit from a night garment as well . Will show again next visit. In supine: Short neck, 5 diaphragmatic breaths, R inguinal nodes and establishment of axilloinguinal pathway, then R UE working proximal to distal, moving inner upper arm outwards and upwards, and doing both sides of forearm, and dorsum of hand, spending extra time in any areas of fibrosis then retracing all steps - skipped L axillary nodes and pathway due to bilateral lymph node removal. Pt forgot her compression bandages but was advised to  bring next time Educated in importance of consistency with compression to reduce/maintain edema.  10/25/2024 Therapeutic Exercise: Supine chest press with dowel x 10 Supine dowel flexion x  Supine AAAROM 158 Right shoulder flexion Manual  Therapy:  In supine: Short neck, 5 diaphragmatic breaths, R inguinal nodes and establishment of axilloinguinal pathway, then R UE working proximal to distal, moving inner upper arm outwards and upwards, and doing both sides of forearms, spending extra time in any areas of fibrosis then retracing all steps - skipped L axillary nodes and pathway due to bilateral lymph node removal. Pt forgot her compression bandages but brought her sleeve/glove. Advised to bring next time Pt assisted with donning exostrong sleeve/glove. Explained folding in half and getting up over wrist first. Able to don with occasional assist, and able to don glove. Good capillary  refill, but advised if she feels hand is too cold or getting numb to remove. Reminded sleve and glove only during the day. Discussed laundering every 2 days. Asked pt to recite instructions back to me due to some memory deficits noted Wrap next visit if measurements still up; asked pt to have her daughter come.      PATIENT EDUCATION:  Education details: how lymphedema is diagnosed, and what causes swelling after breast surgery with LN removal. Showed poster and how superficial lymph vessels are and why MLD is so gentle. Discussed bandaging as a means to reduce swelling, and then when arm is not changing get fit for compression garments to maintain swelling. Might benefit from a night garment as well and showed them circaid profile. Showed pt AAROM for right shoulder flexion and practiced x 4. Did not remember to give pictures. Discussed LOS, treatment interventions, and advised to schedule as soon as possible. She will need to check [employer] schedule before she calls. Also advised pt to find her compression garments from 2023 and bring them in so we can check them. Person educated: Patient and Child(ren) Education method: Explanation Education comprehension: verbalized understanding and needs further education  HOME EXERCISE  PROGRAM:    ASSESSMENT:  CLINICAL IMPRESSION: First session of resuming CDT of Rt UE lymphedema. See above for pt education and reinforcement today. She reports bandage comfortable at end of session and reviewed while applying correct technique and issued handout as well. Pt will benefit from continued physical therapy at this time for further reinforcement of bandaging techniques and to begin a HEP for Rt shoulder A/AA/ROM. Can she get pulleys for home?  OBJECTIVE IMPAIRMENTS: decreased activity tolerance, decreased knowledge of condition, decreased ROM, decreased strength, increased edema, impaired UE functional use, and pain.   ACTIVITY LIMITATIONS: lifting, sleeping, bathing, dressing, reach over head, and hygiene/grooming  PARTICIPATION LIMITATIONS: cleaning, shopping, community activity, and occupation  PERSONAL FACTORS: 1-2 comorbidities: bilateral breast cancer sx, chemo, radiation, lymphedema, shoulder pain are also affecting patient's functional outcome.   REHAB POTENTIAL: Good  CLINICAL DECISION MAKING: Evolving/moderate complexity  EVALUATION COMPLEXITY: Moderate  GOALS: Goals reviewed with patient? Yes  SHORT TERM GOALS: Target date: 12/27/2024  Pt will be independent in a HEP for Right shoulder ROM Baseline: Goal status: INITIAL  2.  Pts daughter/pt will be independent in compression bandaging to pts right UE Baseline:  Goal status: INITIAL  3.  Pt will be independent with MLD to the right UE Baseline:  Goal status: INITIAL  4.  Pt will have AROM right shoulder to atleast 90 degrees flexion for improved reaching Baseline:  Goal status: INITIAL  5.  Pt will have 1 cm reduction at 15 cm prox to ulna styloid process Baseline:  Goal status: INITIAL  LONG TERM GOALS: Target date: 01/17/2025 Pts edema reduction will plateau to allow pt to start in her compression garments Baseline:  Goal status: INITIAL  2.  Pt will receive new compression garments prn, and  may consider night garment prn Baseline:  Goal status: INITIAL  3.  Pts right shoulder/arm pain will improve to no greater than 4/10 for improved function Baseline:  Goal status: INITIAL    PLAN:  PT FREQUENCY: 2-3 times/week x 6 weeks or 16 visits prn  PT DURATION: 8 weeks  PLANNED INTERVENTIONS: [id number]- PT Re-evaluation, 97110-Therapeutic exercises, 97530- Therapeutic activity, 97112- Neuromuscular re-education, 97535- Self Care, [id number]- Manual therapy, 97760- Orthotic Initial, [id number]- Orthotic/Prosthetic subsequent, and Patient/Family education  PLAN FOR NEXT SESSION:  Pt to bring in bandages for next visit, Instruct daughter Ike in compression bandaging if possible  or pt and  have her demonstrate, AAROM for right shoulder, MLD (inguinal nodes only due to bil) ,CDT, check previous garments when she brings, show circaid night again Assess for bandage comfort and tolerance - GFR is only 27%   Grayce Sheldon, PT 12/12/24 1:16 PM  "

## 2024-12-14 ENCOUNTER — Ambulatory Visit (HOSPITAL_BASED_OUTPATIENT_CLINIC_OR_DEPARTMENT_OTHER)

## 2024-12-14 DIAGNOSIS — Z79899 Other long term (current) drug therapy: Secondary | ICD-10-CM

## 2024-12-14 DIAGNOSIS — I10 Essential (primary) hypertension: Secondary | ICD-10-CM

## 2024-12-15 ENCOUNTER — Other Ambulatory Visit: Payer: Self-pay

## 2024-12-16 LAB — SIGNATERA
SIGNATERA MTM READOUT: 357.8 MTM/ml — AB
SIGNATERA TEST RESULT: POSITIVE — AB

## 2024-12-18 ENCOUNTER — Other Ambulatory Visit (HOSPITAL_COMMUNITY): Payer: Self-pay

## 2024-12-19 ENCOUNTER — Ambulatory Visit

## 2024-12-19 ENCOUNTER — Encounter: Payer: Self-pay | Admitting: Hematology and Oncology

## 2024-12-19 ENCOUNTER — Other Ambulatory Visit: Payer: Self-pay

## 2024-12-19 ENCOUNTER — Other Ambulatory Visit (HOSPITAL_COMMUNITY): Payer: Self-pay

## 2024-12-19 NOTE — Progress Notes (Signed)
 Specialty Pharmacy Refill Coordination Note  Spoke with Michele Page is a 69 y.o. female contacted today regarding refills of specialty medication(s) Palbociclib  (IBRANCE )  Doses on hand: 0, next cycle starts 12/25/2024  Patient requested: Delivery   Delivery date: 12/22/24   Verified address: [address] [name] [id number]  Medication will be filled on 12/21/24

## 2024-12-20 ENCOUNTER — Encounter: Payer: Self-pay | Admitting: *Deleted

## 2024-12-20 ENCOUNTER — Inpatient Hospital Stay: Attending: Hematology and Oncology | Admitting: Hematology and Oncology

## 2024-12-20 DIAGNOSIS — Z170 Estrogen receptor positive status [ER+]: Secondary | ICD-10-CM

## 2024-12-20 DIAGNOSIS — C50412 Malignant neoplasm of upper-outer quadrant of left female breast: Secondary | ICD-10-CM

## 2024-12-20 NOTE — Progress Notes (Signed)
 HEMATOLOGY-ONCOLOGY TELEPHONE VISIT PROGRESS NOTE  I connected with our patient on 12/20/24 at  8:00 AM EST by telephone and verified that I am speaking with the correct person using two identifiers.  I discussed the limitations, risks, security and privacy concerns of performing an evaluation and management service by telephone and the availability of in person appointments.  I also discussed with the patient that there may be a patient responsible charge related to this service. The patient expressed understanding and agreed to proceed.   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  She was diagnosed with right breast cancer in September 2022 with subsequent widespread bone metastases and has remained on letrozole  and palbociclib . CT chest, abdomen, and pelvis in November 2025 showed stable disease. Serial Signatera ctDNA testing over 2-3 years has fluctuated, with a recent rise to 357 from a prior low of 29 and a past peak of 662. She and her family are concerned about this recent increase.  She has persistent, severe right upper extremity lymphedema involving the arm and hand for months, with fluctuating but overall unchanged swelling and marked worsening the morning of the visit. Prior referral for lymphedema management with wrapping and stretching did not help. Vascular studies including ultrasound and arterial duplex in November 2025 were negative for thrombosis or arterial insufficiency. No imaging has evaluated the full length of the arm, and there is concern for possible tumor involvement or impaired lymphatic drainage.     Oncology History  Breast cancer of 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)  03/31/2016 Initial Diagnosis   Screening detected left breast mass, 2 nodules, 1.9 x 1.6 x  0.8 cm= fat necrosis; 8 x 7 x 7 mm= grade 2 IDC ER 100%, PR 100%, HER-2 negative ratio 1.39, Ki-67 20%   04/17/2016 Surgery   Left lumpectomy ([name]): IDC grade 2, 1.3 cm, with DCIS, margins negative, 0/4 lymph nodes negative, T1 cN0 stage IA pathologic stage, ER 100%, PR 100%, HER-2 negative ratio 1.39, Ki-67 20% Oncotype DX score 20, 13% ROR, intermediate risk   04/17/2016 Oncotype testing   Recurrence score: 20; ROR 15% (intermediate risk)    05/27/2016 - 07/13/2016 Radiation Therapy   Adjuvant radiation therapy [facility]): Left breast treated with breath hold to 50.4 Gy in 28 fractions at 1.8 Gy/fraction.  Left breast boosted to 10 Gy in 5 fractions at 2 Gy/fraction   07/23/2016 -  Anti-estrogen oral therapy   Anastrozole  1 mg switched to letrozole  04/22/2017 due to hair loss, switch to tamoxifen  10/22/2017 due to hair loss from letrozole  as well   Malignant neoplasm of upper-outer quadrant of right breast in female, estrogen receptor positive ([name])  08/04/2021 Initial Diagnosis   Malignant neoplasm of upper-outer quadrant of right breast in female, estrogen receptor positive ([name])    Relapse/Recurrence   2.2 cm ILC ER 100%, PR 90%, Her 2 Neg, Ki 67: 25%, Size 2.5 cm   08/04/2021 Cancer Staging   Staging form: Breast, AJCC 8th Edition - Clinical: Stage IB (cT2, cN0, cM0, G2, ER+, PR+, HER2-) - Signed by Odean Potts, MD on 08/04/2021 Stage prefix: Initial diagnosis Histologic grading system: 3 grade system   09/02/2021 Surgery   Right lumpectomy: Grade 2 ILC 3.1 cm, margins negative, LVI present, 4/6 lymph nodes macrometastases, 2 lymph nodes isolated tumor cells, ER 100%, PR 90%, Ki-67  25%, HER2 negative   10/17/2021 - 01/30/2022 Chemotherapy   Patient is on Treatment Plan : BREAST Adjuvant CMF IV q21d      Genetic Testing   Negative genetic testing. No pathogenic variants identified on the [facility] CustomNext+RNA panel. The report date is 03/04/2022.  The CustomNext-Cancer+RNAinsight panel  offered by [employer] includes sequencing and rearrangement analysis for the following 47 genes:  APC, ATM, AXIN2, BARD1, BMPR1A, BRCA1, BRCA2, BRIP1, CDH1, CDK4, CDKN2A, CHEK2, DICER1, EPCAM, GREM1, HOXB13, MEN1, MLH1, MSH2, MSH3, MSH6, MUTYH, NBN, NF1, NF2, NTHL1, PALB2, PMS2, POLD1, POLE, PTEN, RAD51C, RAD51D, RECQL, RET, SDHA, SDHAF2, SDHB, SDHC, SDHD, SMAD4, SMARCA4, STK11, TP53, TSC1, TSC2, and VHL.  RNA data is routinely analyzed for use in variant interpretation for all genes.     REVIEW OF SYSTEMS:   Constitutional: Denies fevers, chills or abnormal weight loss All other systems were reviewed with the patient and are negative. Observations/Objective:     Assessment Plan:  Breast cancer of 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) 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, 1.3 cm, with DCIS, margins negative, 0/4 lymph nodes negative, T1 cN0 stage IA pathologic stage, ER 100%, PR 100%, HER-2 negative ratio 1.39, Ki-67 20% (Originally 2 nodules were detected on screening mammogram 1.9 cm= fat necrosis and 8 mm IDC) Oncotype DX score 20, 13% risk of recurrence, intermediate risk Adjuvant radiation therapy started 07/20/2017completed 07/13/2016     09/02/2021:Right lumpectomy: Grade 2 ILC 3.1 cm, margins negative, LVI present, 4/6 lymph nodes macrometastases, 2 lymph nodes isolated tumor cells, ER 100%, PR 90%, Ki-67 25%, HER2 negative   Treatment plan/summary: 1. systemic chemotherapy with CMF x6 cycles completed 01/30/2022 2. adjuvant radiation therapy completing 04/09/2022 3.  For the continued antiestrogen therapy (previously anastrozole  started 07/23/2016 switched to letrozole  2018 switched to tamoxifen  2018, switched back to anastrozole ) hair loss was the main problem, may consider exemestane --------------------------------------------------------------------------------------   Current treatment: Letrozole  with Ibrance .  Patient could not tolerate  Verzinio Toxicities: Fatigue Leukopenia: ANC 1.9   Signatera:  06/12/2022: Positive (0.52) 09/02/2022: Positive (68.11) Mar 22, 2023: Positive (106.54) 07/06/2023: Positive (662) 09/13/2023: Positive (57.14) 03/07/2024: 109.06 09/24/2024: 29.94 12/06/2024: 357.8   CT CAP 07/10/2022: No metastatic disease.  Tiny pulmonary nodules nonspecific. MRI neck: 10/26/2023: Left thyroid  nodule 2.5 cm unchanged, no findings in the neck 10/11/2024: CT CAP: 3 mm right upper lobe lung nodule new, focal lucency L1 stable T11 and T12 and bilateral iliac bones MRI lumbar spine 10/20/2024: Widespread enhancing bone metastatic disease. Mild L1 pathologic compression fracture stable    Elevated creatinine: Has an appointment with nephrology coming up Lymphedema of the hand: We will see if the CT scan shows any findings for lymphatic obstruction.  November 2025: Ultrasound of the arm was negative.   Zometa every 3 months (could not be started because of renal failure)  Elevated Signatera: I recommended that we obtain CT chest abdomen pelvis within the week better Telephone visit after that to discuss results. I also recommend stopping Signatera testing   I connected with the patient and her daughters during this call.     I discussed the assessment and treatment plan with the patient. The patient was provided an opportunity to ask questions and all were answered. The patient agreed with the plan and demonstrated an understanding of the instructions. The patient was advised to call back or seek an in-person evaluation if the symptoms worsen or if the condition fails to improve as anticipated.   I provided 20 minutes  of non-face-to-face time during this encounter.  This includes time for charting and coordination of care   Michele MARLA Chad, MD

## 2024-12-20 NOTE — Progress Notes (Signed)
 Per MD request RN contacted [employer] representative to cancel testing at this time.

## 2024-12-20 NOTE — Assessment & Plan Note (Signed)
 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 systemic chemotherapy with CMF x6 cycles completed 01/30/2022 2. adjuvant radiation therapy completing 04/09/2022 3.  For the continued antiestrogen therapy (previously anastrozole  started 07/23/2016 switched to letrozole  2018 switched to tamoxifen  2018, switched back to anastrozole ) hair loss was the main problem, may consider exemestane --------------------------------------------------------------------------------------   Current treatment: Letrozole  with Ibrance .  Patient could not tolerate Verzinio Toxicities: Fatigue Leukopenia: ANC 1.9   Signatera:  06/12/2022: Positive (0.52) 09/02/2022: Positive (68.11) Mar 22, 2023: Positive (106.54) 07/06/2023: Positive (662) 09/13/2023: Positive (57.14) 03/07/2024: 109.06 09/24/2024: 29.94 12/06/2024: 357.8   CT CAP 07/10/2022: No metastatic disease.  Tiny pulmonary nodules nonspecific. MRI neck: 10/26/2023: Left thyroid  nodule 2.5 cm unchanged, no findings in the neck 10/11/2024: CT CAP: 3 mm right upper lobe lung nodule new, focal lucency L1 stable T11 and T12 and bilateral iliac bones MRI lumbar spine 10/20/2024: Widespread enhancing bone metastatic disease. Mild L1 pathologic compression fracture stable    Elevated creatinine: Has an appointment with nephrology coming up Lymphedema of the hand: Will send a referral  to physical therapy   Zometa every 3 months (could not be started because of renal failure) Follow-up in 2 months to recheck the labs and Zometa if her creatinine is improving.  Her insurance does not permit Xgeva.

## 2024-12-21 ENCOUNTER — Other Ambulatory Visit: Payer: Self-pay

## 2024-12-21 ENCOUNTER — Ambulatory Visit

## 2024-12-22 ENCOUNTER — Ambulatory Visit: Attending: Hematology and Oncology

## 2024-12-27 ENCOUNTER — Ambulatory Visit: Admitting: Rehabilitation

## 2024-12-28 ENCOUNTER — Ambulatory Visit (HOSPITAL_COMMUNITY)

## 2025-01-04 ENCOUNTER — Encounter (HOSPITAL_BASED_OUTPATIENT_CLINIC_OR_DEPARTMENT_OTHER): Admitting: Family

## 2025-01-04 ENCOUNTER — Inpatient Hospital Stay: Admitting: Hematology and Oncology

## 2025-01-15 ENCOUNTER — Ambulatory Visit: Admitting: Nurse Practitioner

## 2025-02-05 ENCOUNTER — Inpatient Hospital Stay

## 2025-02-05 ENCOUNTER — Inpatient Hospital Stay: Attending: Hematology and Oncology

## 2025-02-05 ENCOUNTER — Inpatient Hospital Stay: Admitting: Hematology and Oncology
